# Patient Record
Sex: Male | Born: 1960 | Race: White | Hispanic: No | Marital: Single | Smoking: Current every day smoker
Health system: Southern US, Community
[De-identification: ages and names within clinical notes are randomized; demographics above are authoritative.]

## PROBLEM LIST (undated history)

## (undated) DIAGNOSIS — J449 Chronic obstructive pulmonary disease, unspecified: Secondary | ICD-10-CM

## (undated) DIAGNOSIS — I499 Cardiac arrhythmia, unspecified: Secondary | ICD-10-CM

## (undated) DIAGNOSIS — I509 Heart failure, unspecified: Secondary | ICD-10-CM

## (undated) DIAGNOSIS — J45909 Unspecified asthma, uncomplicated: Secondary | ICD-10-CM

## (undated) DIAGNOSIS — Z720 Tobacco use: Secondary | ICD-10-CM

## (undated) HISTORY — DX: Heart failure, unspecified: I50.9

## (undated) HISTORY — DX: Cardiac arrhythmia, unspecified: I49.9

---

## 2002-07-06 ENCOUNTER — Encounter: Payer: Self-pay | Admitting: Emergency Medicine

## 2002-07-06 ENCOUNTER — Inpatient Hospital Stay (HOSPITAL_COMMUNITY): Admission: EM | Admit: 2002-07-06 | Discharge: 2002-07-06 | Payer: Self-pay | Admitting: Emergency Medicine

## 2004-11-21 ENCOUNTER — Inpatient Hospital Stay (HOSPITAL_COMMUNITY): Admission: EM | Admit: 2004-11-21 | Discharge: 2004-11-22 | Payer: Self-pay | Admitting: *Deleted

## 2005-12-09 IMAGING — CR DG CHEST 1V PORT
1 series · 1 of 1 positions shown · non-contrast
Comparison: none

CLINICAL DATA: Shortness of breath.  
 PORTABLE CHEST 1 VIEW - 11/22/04 AT 4961 HOURS:
 Compare study:    11/21/04 at 8839 hours.
 Vascular congestion.  Negative for edema or infiltrates.  Left basilar subsegmental atelectasis.  Mild cardiac enlargement.

[view not recorded]
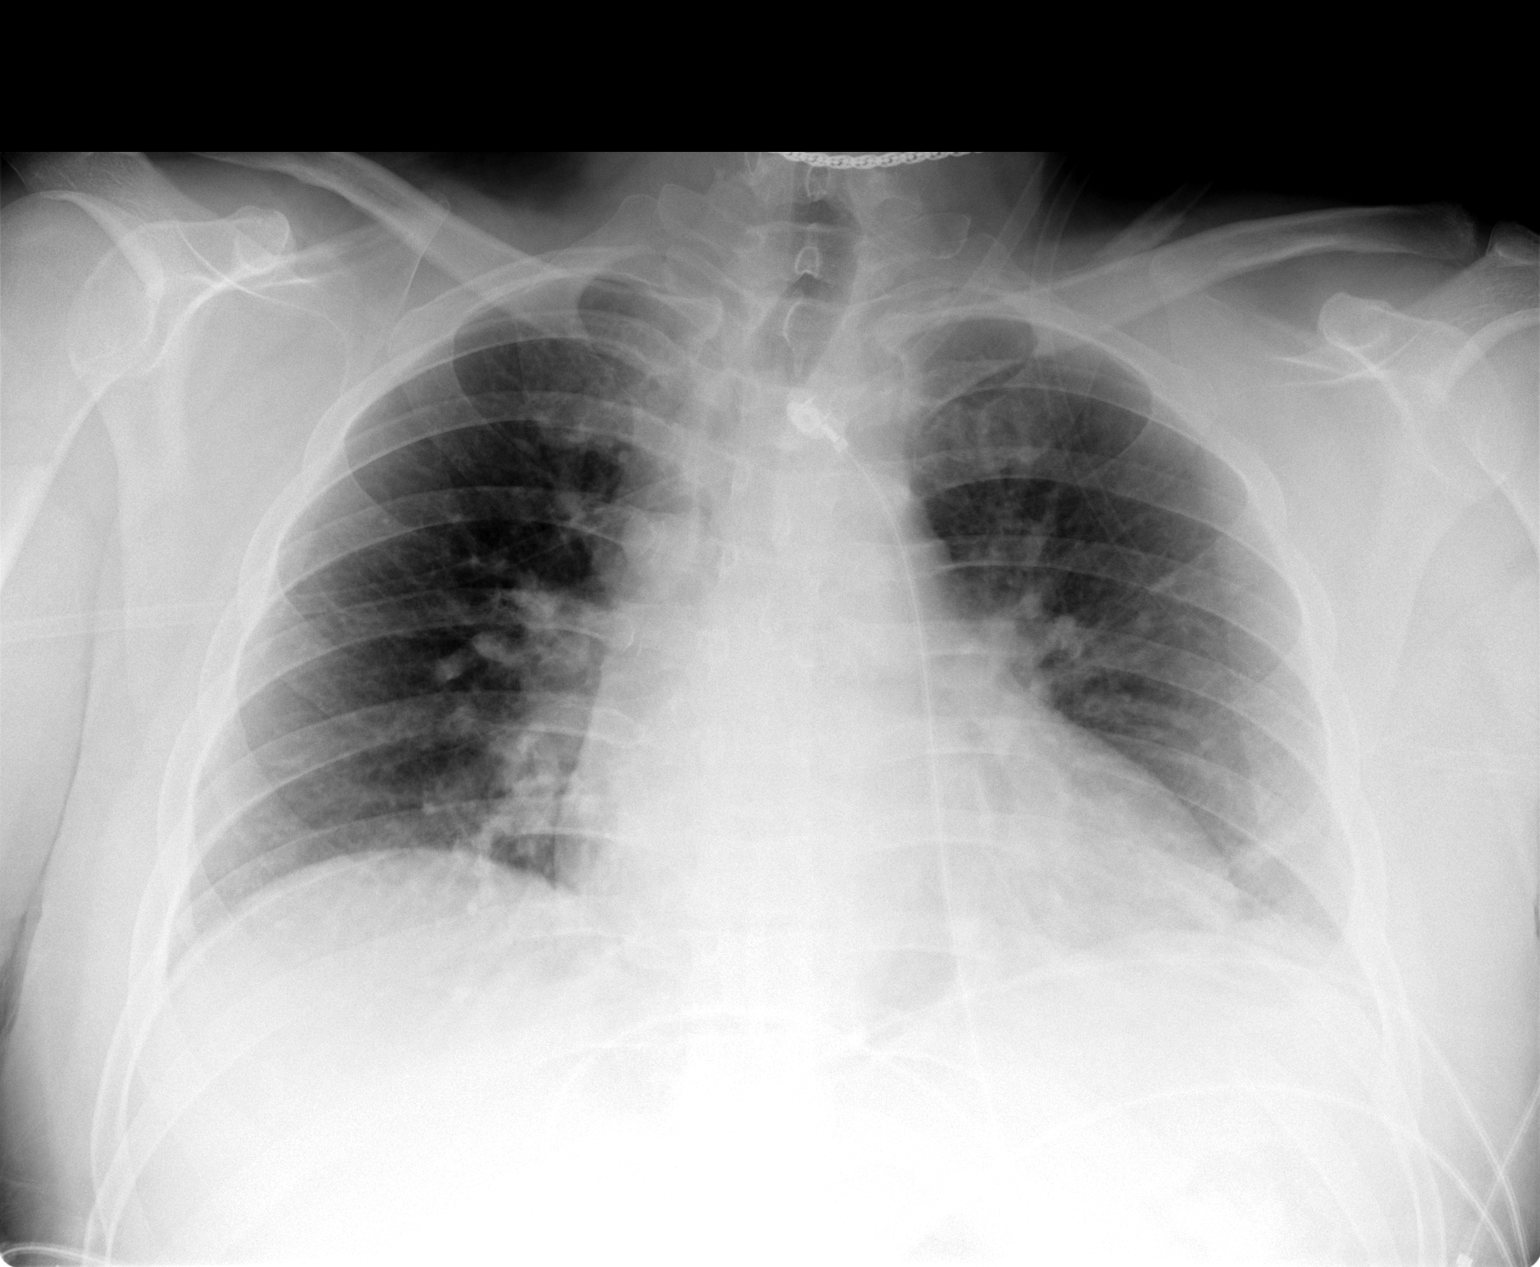

[1 of 1 positions shown; findings below may reference images not displayed]

IMPRESSION: Left basilar subsegmental atelectasis with vascular congestion.

## 2009-04-20 ENCOUNTER — Emergency Department: Payer: Self-pay | Admitting: Emergency Medicine

## 2013-01-26 ENCOUNTER — Emergency Department (HOSPITAL_COMMUNITY)
Admission: EM | Admit: 2013-01-26 | Discharge: 2013-01-26 | Disposition: A | Discharge status: Home / Self Care | Payer: Self-pay | Attending: Emergency Medicine | Admitting: Emergency Medicine

## 2013-01-26 ENCOUNTER — Encounter (HOSPITAL_COMMUNITY): Payer: Self-pay

## 2013-01-26 ENCOUNTER — Emergency Department (HOSPITAL_COMMUNITY): Payer: Self-pay

## 2013-01-26 DIAGNOSIS — J449 Chronic obstructive pulmonary disease, unspecified: Secondary | ICD-10-CM | POA: Insufficient documentation

## 2013-01-26 DIAGNOSIS — R0789 Other chest pain: Secondary | ICD-10-CM | POA: Insufficient documentation

## 2013-01-26 DIAGNOSIS — R059 Cough, unspecified: Secondary | ICD-10-CM | POA: Insufficient documentation

## 2013-01-26 DIAGNOSIS — Z79899 Other long term (current) drug therapy: Secondary | ICD-10-CM | POA: Insufficient documentation

## 2013-01-26 DIAGNOSIS — J45902 Unspecified asthma with status asthmaticus: Secondary | ICD-10-CM | POA: Insufficient documentation

## 2013-01-26 DIAGNOSIS — R05 Cough: Secondary | ICD-10-CM | POA: Insufficient documentation

## 2013-01-26 DIAGNOSIS — F17200 Nicotine dependence, unspecified, uncomplicated: Secondary | ICD-10-CM | POA: Insufficient documentation

## 2013-01-26 HISTORY — DX: Unspecified asthma, uncomplicated: J45.909

## 2013-01-26 LAB — BASIC METABOLIC PANEL
BUN: 13 mg/dL (ref 6–23)
Calcium: 8.9 mg/dL (ref 8.4–10.5)
Chloride: 102 mEq/L (ref 96–112)
Creatinine, Ser: 0.84 mg/dL (ref 0.50–1.35)
GFR calc Af Amer: >90 mL/min (ref >90)
GFR calc non Af Amer: >90 mL/min (ref >90)
Glucose, Bld: 86 mg/dL (ref 70–99)
Potassium: 3.7 mEq/L (ref 3.5–5.1)
Sodium: 142 mEq/L (ref 135–145)

## 2013-01-26 LAB — CBC WITH DIFFERENTIAL/PLATELET
Basophils Absolute: 0 10*3/uL (ref 0.0–0.1)
Basophils Relative: 0 % (ref 0–1)
Eosinophils Absolute: 0.7 10*3/uL (ref 0.0–0.7)
Eosinophils Relative: 10 % — ABNORMAL HIGH (ref 0–5)
HCT: 49.3 % (ref 39.0–52.0)
Hemoglobin: 16.9 g/dL (ref 13.0–17.0)
Lymphocytes Relative: 37 % (ref 12–46)
MCH: 30.6 pg (ref 26.0–34.0)
MCHC: 34.3 g/dL (ref 30.0–36.0)
Monocytes Relative: 7 % (ref 3–12)
Neutro Abs: 3.2 10*3/uL (ref 1.7–7.7)
Neutrophils Relative %: 46 % (ref 43–77)
Platelets: 228 10*3/uL (ref 150–400)
RBC: 5.53 MIL/uL (ref 4.22–5.81)
RDW: 13.9 % (ref 11.5–15.5)
WBC: 6.9 10*3/uL (ref 4.0–10.5)

## 2013-01-26 MED ORDER — HYDROCODONE-HOMATROPINE 5-1.5 MG/5ML PO SYRP: 5.0000 mL | ORAL_SOLUTION | Freq: Four times a day (QID) | ORAL | Status: DC | PRN

## 2013-01-26 MED ORDER — PREDNISONE 20 MG PO TABS: 60.0000 mg | ORAL_TABLET | Freq: Every day | ORAL | Status: DC

## 2013-01-26 MED ORDER — METHYLPREDNISOLONE SODIUM SUCC 125 MG IJ SOLR
125.0000 mg | Freq: Once | INTRAMUSCULAR | Status: AC
  Administered 2013-01-26: 125 mg via INTRAVENOUS
  Filled 2013-01-26: qty 2

## 2013-01-26 MED ORDER — ALBUTEROL SULFATE (5 MG/ML) 0.5% IN NEBU
10.0000 mg | INHALATION_SOLUTION | RESPIRATORY_TRACT | Status: AC
  Administered 2013-01-26: 10 mg via RESPIRATORY_TRACT
  Filled 2013-01-26: qty 2

## 2013-01-26 MED ORDER — ALBUTEROL SULFATE (2.5 MG/3ML) 0.083% IN NEBU: 2.5000 mg | INHALATION_SOLUTION | RESPIRATORY_TRACT | Status: DC | PRN

## 2013-01-26 MED ORDER — ALBUTEROL SULFATE HFA 108 (90 BASE) MCG/ACT IN AERS: 2.0000 | INHALATION_SPRAY | RESPIRATORY_TRACT | Status: DC | PRN

## 2013-01-26 MED ORDER — AZITHROMYCIN 250 MG PO TABS: ORAL_TABLET | ORAL | Status: DC

## 2013-01-26 NOTE — ED Notes (Signed)
Pt is currently on continuous neb. NAD.

## 2013-01-26 NOTE — ED Notes (Signed)
Complain of being SOB and wheezing for a week

## 2013-01-26 NOTE — ED Provider Notes (Signed)
History     CSN: [id number]  Arrival date & time 01/26/13  1732   First MD Initiated Contact with Patient 01/26/13 1740      Chief Complaint  Patient presents with  . Asthma    (Consider location/radiation/quality/duration/timing/severity/associated sxs/prior treatment) HPI Comments: Patient comes to the ER for evaluation of shortness of breath and wheezing. Patient reports a previous history of asthma, does have a long smoking history as well. Patient reports that last week he has had progressively worsening shortness of breath associated with cough. He has not had any fever. Cough is mostly nonproductive. Patient reports that shortness of breath has become severe, now experiencing pain with breathing.  Patient is a 52 y.o. male presenting with asthma.  Asthma Associated symptoms include shortness of breath.    Past Medical History  Diagnosis Date  . Asthma     History reviewed. No pertinent past surgical history.  No family history on file.  History  Substance Use Topics  . Smoking status: Current Every Day Smoker  . Smokeless tobacco: Not on file  . Alcohol Use: No      Review of Systems  Constitutional: Negative for fever.  Respiratory: Positive for cough, chest tightness and shortness of breath.   All other systems reviewed and are negative.    Allergies  Review of patient's allergies indicates no known allergies.  Home Medications   Current Outpatient Rx  Name  Route  Sig  Dispense  Refill  . albuterol (PROVENTIL HFA;VENTOLIN HFA) 108 (90 BASE) MCG/ACT inhaler   Inhalation   Inhale 2 puffs into the lungs every 6 (six) hours as needed for wheezing.           BP 147/93  Pulse 90  Temp(Src) 97.7 F (36.5 C) (Oral)  Resp 26  Ht 6' (1.829 m)  Wt 260 lb (117.935 kg)  BMI 35.25 kg/m2  SpO2 90%  Physical Exam  Constitutional: He is oriented to person, place, and time. He appears well-developed and well-nourished. He appears distressed.  HENT:   Head: Normocephalic and atraumatic.  Right Ear: Hearing normal.  Nose: Nose normal.  Mouth/Throat: Oropharynx is clear and moist and mucous membranes are normal.  Eyes: Conjunctivae and EOM are normal. Pupils are equal, round, and reactive to light.  Neck: Normal range of motion. Neck supple.  Cardiovascular: Normal rate, regular rhythm, S1 normal and S2 normal.  Exam reveals no gallop and no friction rub.   No murmur heard. Pulmonary/Chest: Accessory muscle usage present. Tachypnea noted. He has wheezes in the right upper field, the right middle field, the right lower field, the left upper field, the left middle field and the left lower field. He has no rhonchi. He has no rales. He exhibits no tenderness.  Abdominal: Soft. Normal appearance and bowel sounds are normal. There is no hepatosplenomegaly. There is no tenderness. There is no rebound, no guarding, no tenderness at McBurney's point and negative Murphy's sign. No hernia.  Musculoskeletal: Normal range of motion.  Neurological: He is alert and oriented to person, place, and time. He has normal strength. No cranial nerve deficit or sensory deficit. Coordination normal. GCS eye subscore is 4. GCS verbal subscore is 5. GCS motor subscore is 6.  Skin: Skin is warm, dry and intact. No rash noted. No cyanosis.  Psychiatric: He has a normal mood and affect. His speech is normal and behavior is normal. Thought content normal.    ED Course  Procedures (including critical care time)  Labs Reviewed  CBC WITH DIFFERENTIAL - Abnormal; Notable for the following:    Eosinophils Relative 10 (*)    All other components within normal limits  BASIC METABOLIC PANEL   Dg Chest Port 1 View  01/26/2013  *RADIOLOGY REPORT*  Clinical Data: Shortness of breath.  PORTABLE CHEST - 1 VIEW  Comparison: 11/22/2004  Findings: Scarring in the lingula.  Heart is normal size.  Right lung is clear.  No effusions or acute bony abnormality.  IMPRESSION: No acute  cardiopulmonary disease.   Original Report Authenticated By: Charlett Nose, M.D.      Diagnosis: COPD exacerbation    MDM  Patient presents to the ER with complaints of sickness for shortness of breath. He reports a history of asthma, but has a long smoking history and likely has an element of COPD as well. Patient had significant bronchospasm at arrival. Oxygen saturation was around 90% on room air. Patient was administered a one hour albuterol treatment and Solu-Medrol. Repeat examination revealed that he was breathing more comfortably, but still hypoxic. Oxygen running 88% to 91% on room air at rest. I recommended admission. Patient declines admission at this time. I did discuss with him the seriousness of his illness. I explained to him that his breathing to the person that he stopped breathing. This could result in permanent disability, brain damage and death. He understands this but does not wish to be admitted to the hospital. He lives alone. Arrangements have been made for him to stay with his sister and mother for the next few days. His mother has COPD and has a nebulizer machine. Patient will be provided albuterol solution for nebulizer, treated with Zithromax and prednisone as well. Patient given Hycodan for cough. He was counseled that he needs to return to the ER for any worsening symptoms and states that he will. Once again, patient declined admission. His sister is present and she agrees with this decision. He'll stay with her and she is taking responsibility for him.        Gilda Crease, MD 01/26/13 [phone]

## 2013-07-23 ENCOUNTER — Emergency Department: Payer: Self-pay | Admitting: Emergency Medicine

## 2014-09-07 ENCOUNTER — Emergency Department: Payer: Self-pay | Admitting: Emergency Medicine

## 2015-03-19 ENCOUNTER — Emergency Department
Admit: 2015-03-19 | Disposition: A | Discharge status: Home / Self Care | Payer: Self-pay | Admitting: Emergency Medicine

## 2015-03-19 LAB — CBC
HCT: 46.7 % (ref 40.0–52.0)
HGB: 15.3 g/dL (ref 13.0–18.0)
MCH: 29.9 pg (ref 26.0–34.0)
MCHC: 32.7 g/dL (ref 32.0–36.0)
MCV: 92 fL (ref 80–100)
Platelet: 228 10*3/uL (ref 150–440)
RBC: 5.1 10*6/uL (ref 4.40–5.90)
RDW: 15 % — ABNORMAL HIGH (ref 11.5–14.5)
WBC: 8.7 10*3/uL (ref 3.8–10.6)

## 2015-03-19 LAB — URIC ACID: Uric Acid: 5.4 mg/dL

## 2015-09-24 IMAGING — CR DG HIP COMPLETE 2+V*L*
1 series · 4 of 4 positions shown · non-contrast
Comparison: None.

CLINICAL DATA: 52-year-old male with acute left hip pain. Reports
numerous recent falls (not otherwise specified at the time of this
report). initial encounter.

EXAM:
LEFT HIP - COMPLETE 2+ VIEW

[Series 1: dxr hip left complete · 0.14mm/px · 4 of 4 slices shown]
[im 1/4]
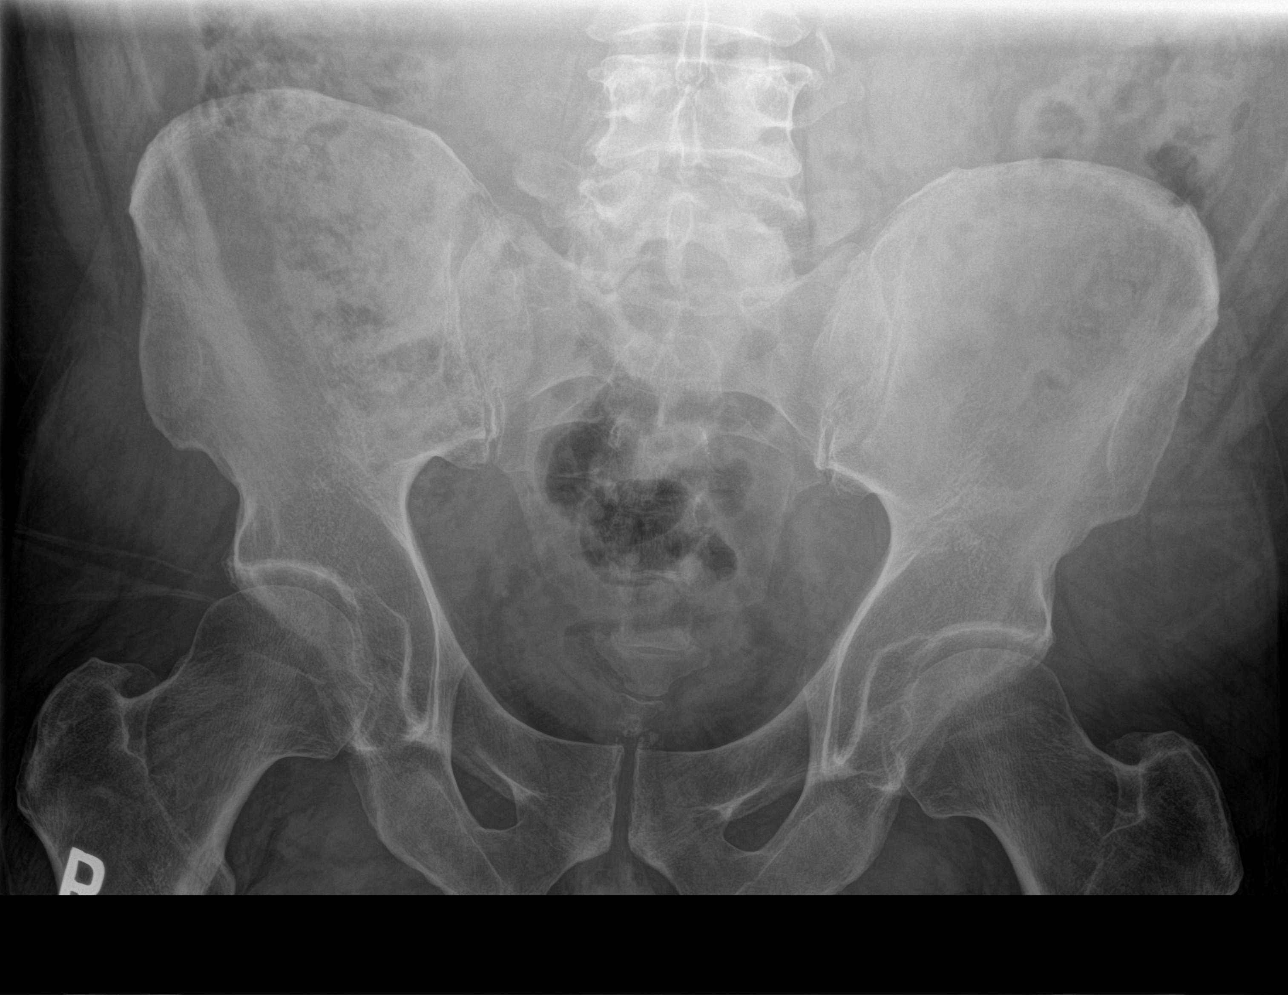
[im 2/4]
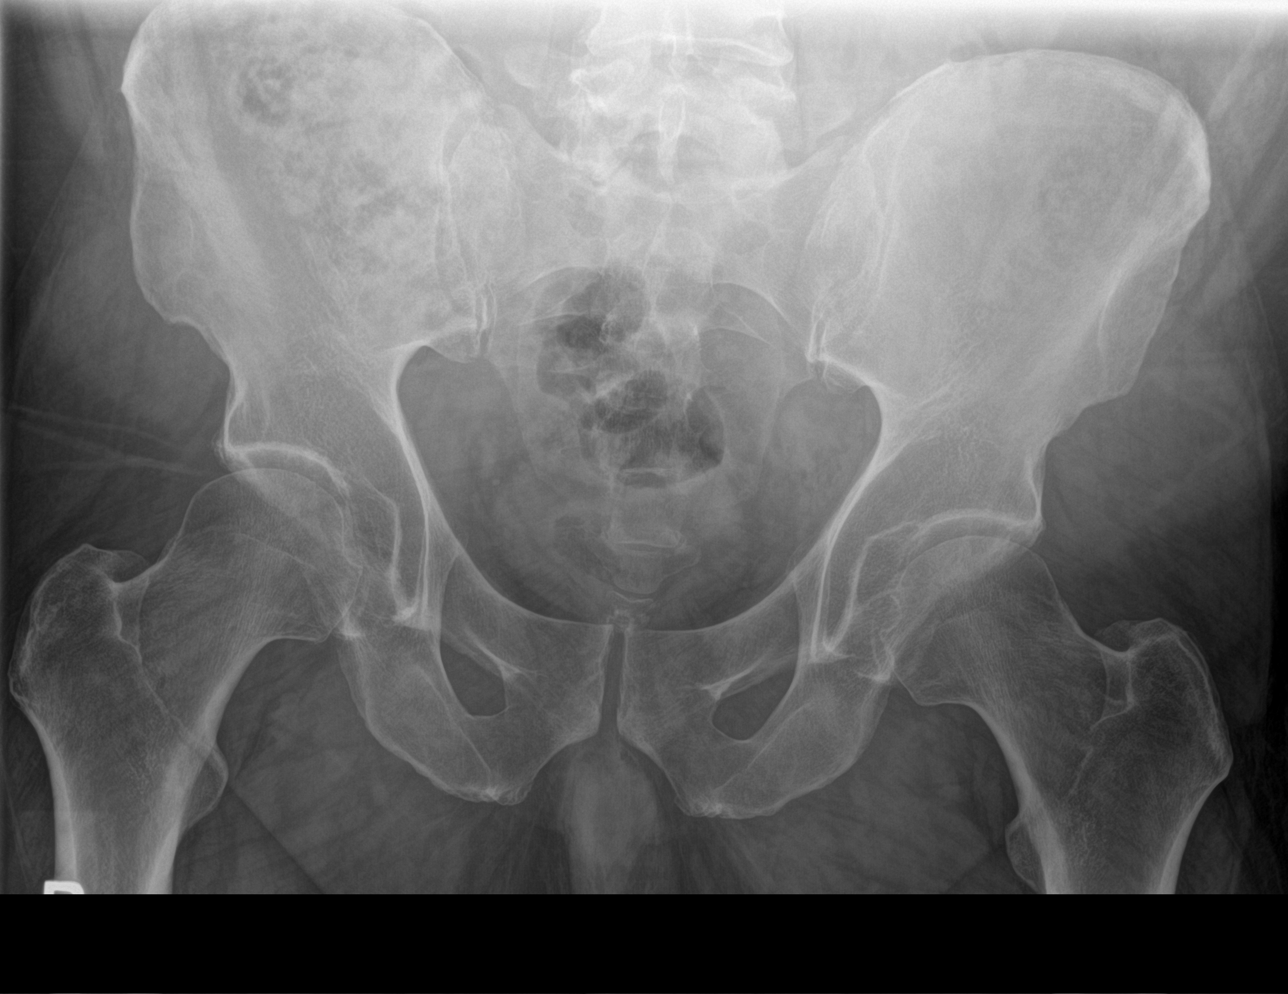
[im 3/4]
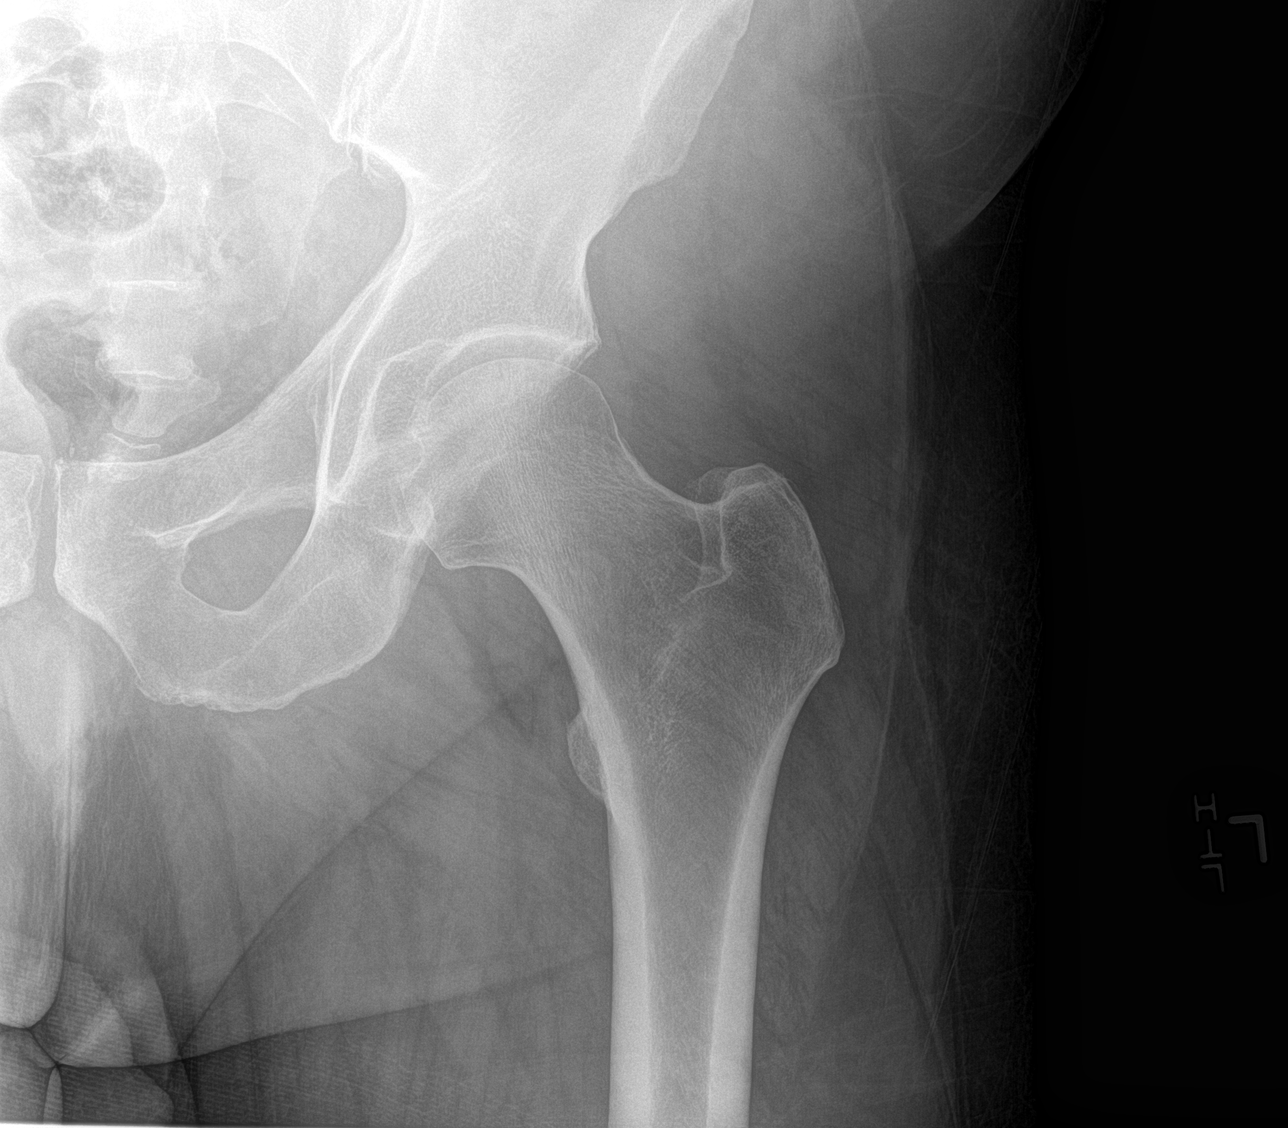
[im 4/4]
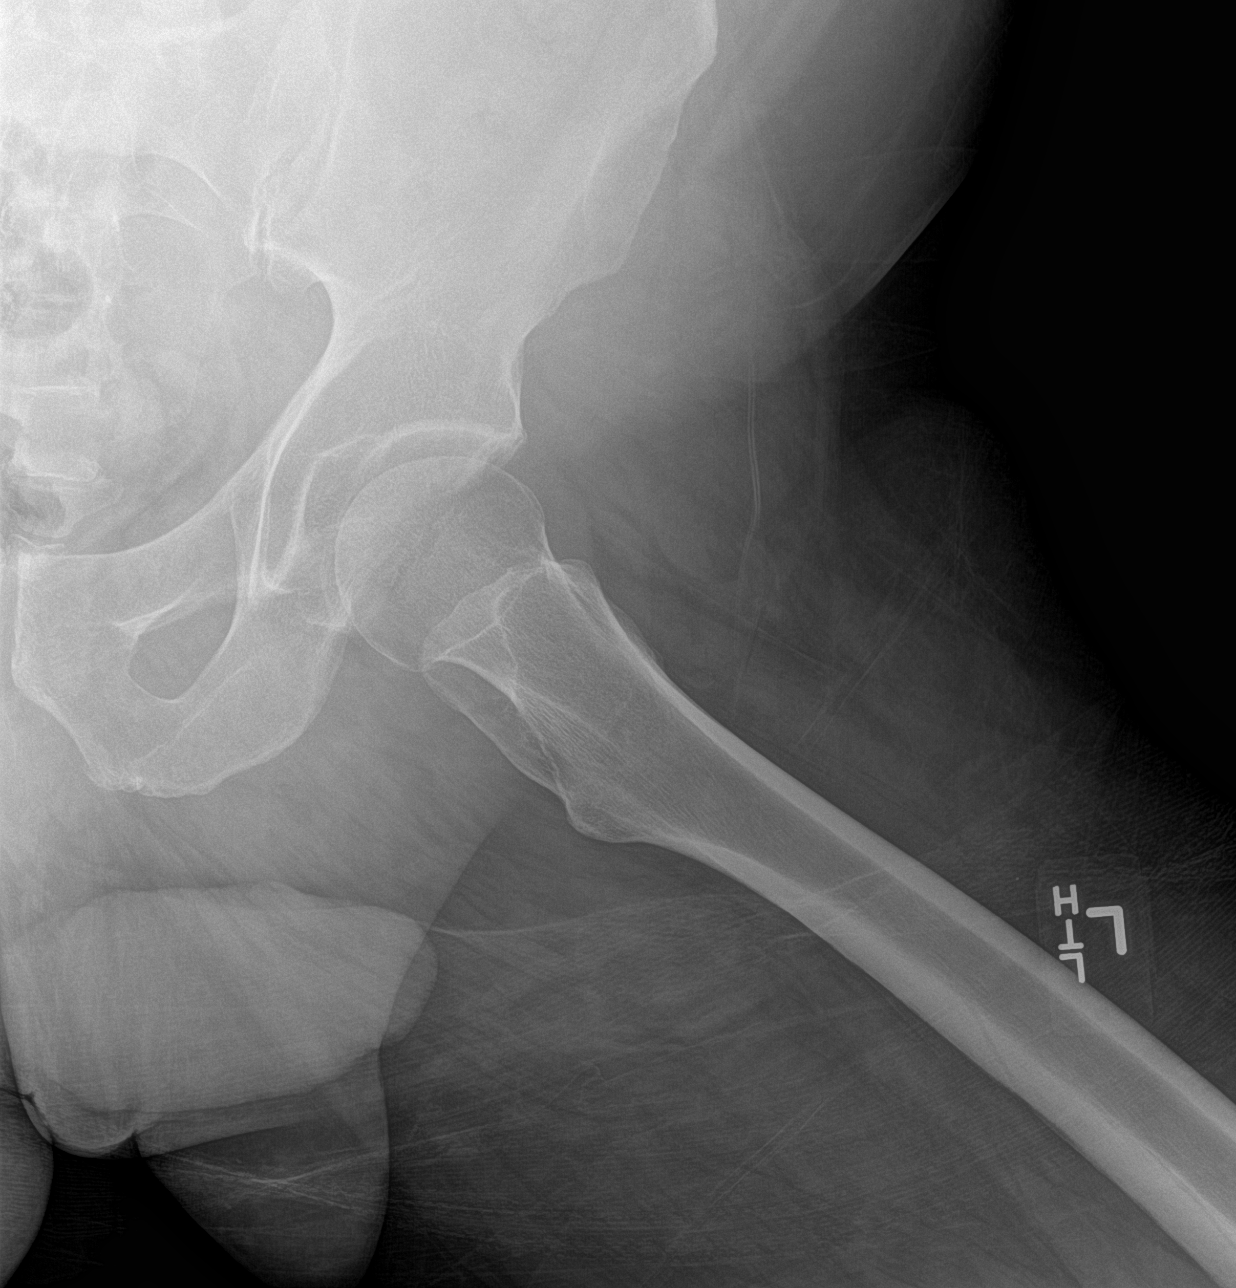

[4 of 4 positions shown; findings below may reference images not displayed]

FINDINGS: Both femoral heads are normally located. Hip joint spaces are
preserved. Pelvis intact. sacral ala and SI joints appear normal.
Proximal right femur grossly intact. Proximal left femur intact.
IMPRESSION: Negative for age radiographic appearance of the left hip and pelvis.

## 2015-10-01 ENCOUNTER — Encounter: Payer: Self-pay | Admitting: Emergency Medicine

## 2015-10-01 ENCOUNTER — Emergency Department: Payer: Self-pay

## 2015-10-01 ENCOUNTER — Emergency Department
Admission: EM | Admit: 2015-10-01 | Discharge: 2015-10-01 | Disposition: A | Discharge status: Home / Self Care | Payer: Self-pay | Attending: Emergency Medicine | Admitting: Emergency Medicine

## 2015-10-01 DIAGNOSIS — S40811A Abrasion of right upper arm, initial encounter: Secondary | ICD-10-CM | POA: Insufficient documentation

## 2015-10-01 DIAGNOSIS — Y998 Other external cause status: Secondary | ICD-10-CM | POA: Insufficient documentation

## 2015-10-01 DIAGNOSIS — Y9289 Other specified places as the place of occurrence of the external cause: Secondary | ICD-10-CM | POA: Insufficient documentation

## 2015-10-01 DIAGNOSIS — S20211A Contusion of right front wall of thorax, initial encounter: Secondary | ICD-10-CM | POA: Insufficient documentation

## 2015-10-01 DIAGNOSIS — Y9389 Activity, other specified: Secondary | ICD-10-CM | POA: Insufficient documentation

## 2015-10-01 DIAGNOSIS — Z720 Tobacco use: Secondary | ICD-10-CM | POA: Insufficient documentation

## 2015-10-01 DIAGNOSIS — S32000A Wedge compression fracture of unspecified lumbar vertebra, initial encounter for closed fracture: Secondary | ICD-10-CM

## 2015-10-01 DIAGNOSIS — W1789XA Other fall from one level to another, initial encounter: Secondary | ICD-10-CM | POA: Insufficient documentation

## 2015-10-01 DIAGNOSIS — S32059A Unspecified fracture of fifth lumbar vertebra, initial encounter for closed fracture: Secondary | ICD-10-CM | POA: Insufficient documentation

## 2015-10-01 DIAGNOSIS — J45901 Unspecified asthma with (acute) exacerbation: Secondary | ICD-10-CM | POA: Insufficient documentation

## 2015-10-01 DIAGNOSIS — S32039A Unspecified fracture of third lumbar vertebra, initial encounter for closed fracture: Secondary | ICD-10-CM | POA: Insufficient documentation

## 2015-10-01 DIAGNOSIS — S93402A Sprain of unspecified ligament of left ankle, initial encounter: Secondary | ICD-10-CM | POA: Insufficient documentation

## 2015-10-01 MED ORDER — HYDROMORPHONE HCL 1 MG/ML IJ SOLN
1.0000 mg | Freq: Once | INTRAMUSCULAR | Status: AC
  Administered 2015-10-01: 1 mg via INTRAMUSCULAR
  Filled 2015-10-01: qty 1

## 2015-10-01 MED ORDER — ONDANSETRON 4 MG PO TBDP
ORAL_TABLET | ORAL | Status: AC
Start: 2015-10-01 — End: 2015-10-01
  Administered 2015-10-01: 4 mg via ORAL
  Filled 2015-10-01: qty 1

## 2015-10-01 MED ORDER — ONDANSETRON 4 MG PO TBDP
ORAL_TABLET | ORAL | Status: AC
  Administered 2015-10-01: 4 mg via ORAL
  Filled 2015-10-01: qty 1

## 2015-10-01 MED ORDER — IBUPROFEN 600 MG PO TABS
600.0000 mg | ORAL_TABLET | Freq: Once | ORAL | Status: AC
  Administered 2015-10-01: 600 mg via ORAL
  Filled 2015-10-01: qty 1

## 2015-10-01 MED ORDER — IBUPROFEN 600 MG PO TABS: 600.0000 mg | ORAL_TABLET | Freq: Three times a day (TID) | ORAL | Status: DC | PRN

## 2015-10-01 MED ORDER — ONDANSETRON HCL 4 MG/2ML IJ SOLN: 4.0000 mg | Freq: Once | INTRAMUSCULAR | Status: DC

## 2015-10-01 MED ORDER — ONDANSETRON 4 MG PO TBDP
4.0000 mg | ORAL_TABLET | Freq: Once | ORAL | Status: AC
  Administered 2015-10-01: 4 mg via ORAL

## 2015-10-01 NOTE — ED Notes (Signed)
philly collar applied in triage

## 2015-10-01 NOTE — ED Notes (Signed)
Pt was working and fell 9 feet through ceiling.  C/o back pain, right ankle pain, right rib pain.  Pain with breathing.  "back felt weird when hit" not workers comp per pt.

## 2015-10-01 NOTE — ED Provider Notes (Signed)
[facility] Emergency Department Provider Note   ____________________________________________  Time seen: 3:00 PM I have reviewed the triage vital signs and the triage nursing note.  HISTORY  Chief Complaint Fall   Historian Patient  HPI Kenneth Benson is a 54 y.o. male who is here for evaluation after a fall. He fell through a ceiling landing on his feet. He waited about a half hour before deciding to come over for evaluation. He did not strike his head. There is no loss of consciousness. There is no neck pain. He is complaining of pain at the left lateral ankle, right side chest wall when he takes a deep breath or coughs, and the low back. He does have history of low back pain. Pain is between 4-5 on at rest and up to a 7 or 8 when he moves.    Past Medical History  Diagnosis Date  . Asthma     There are no active problems to display for this patient.   History reviewed. No pertinent past surgical history.  Current Outpatient Rx  Name  Route  Sig  Dispense  Refill  . albuterol (PROVENTIL HFA;VENTOLIN HFA) 108 (90 BASE) MCG/ACT inhaler   Inhalation   Inhale 2 puffs into the lungs every 6 (six) hours as needed for wheezing.         Marland Kitchen ibuprofen (ADVIL,MOTRIN) 600 MG tablet   Oral   Take 1 tablet (600 mg total) by mouth every 8 (eight) hours as needed.   20 tablet   0     Allergies Review of patient's allergies indicates no known allergies.  History reviewed. No pertinent family history.  Social History Social History  Substance Use Topics  . Smoking status: Current Every Day Smoker  . Smokeless tobacco: None  . Alcohol Use: No    Review of Systems  Constitutional: Negative for recent illnesses. Eyes: Negative for visual changes. ENT: Negative for sore throat. Cardiovascular: Negative for palpitations. Respiratory: Negative for shortness of breath. Gastrointestinal: Negative for abdominal pain, vomiting and  diarrhea. Genitourinary: Negative for dysuria. Musculoskeletal: Positive for back pain. Skin: Negative for rash. Neurological: Negative for headache. 10 point Review of Systems otherwise negative ____________________________________________   PHYSICAL EXAM:  VITAL SIGNS: ED Triage Vitals  Enc Vitals Group     BP 10/01/15 1412 102/68 mmHg     Pulse Rate 10/01/15 1412 96     Resp 10/01/15 1412 22     Temp 10/01/15 1412 97.8 F (36.6 C)     Temp Source 10/01/15 1412 Oral     SpO2 10/01/15 1412 94 %     Weight 10/01/15 1412 270 lb (122.471 kg)     Height 10/01/15 1412 6' (1.829 m)     Head Cir --      Peak Flow --      Pain Score 10/01/15 1412 8     Pain Loc --      Pain Edu? --      Excl. in GC? --      Constitutional: Alert and oriented. Well appearing and in no distress. Smells heavily of cigarette smoke Eyes: Conjunctivae are normal. PERRL. Normal extraocular movements. ENT   Head: Normocephalic and atraumatic.   Nose: No congestion/rhinnorhea.   Mouth/Throat: Mucous membranes are moist.   Neck: No stridor. Cardiovascular/Chest: Normal rate, regular rhythm.  No murmurs, rubs, or gallops. Chest wall tenderness on the right lateral area with lateral compression. No ecchymosis visualized. No flail chest. Respiratory: Normal respiratory  effort without tachypnea nor retractions. Breath sounds are clear and equal bilaterally. Mild wheeze. Gastrointestinal: Soft. No distention, no guarding, no rebound. Nontender   Genitourinary/rectal:Deferred Musculoskeletal: Pelvis stable, hips nontender. Left lateral ankle is swollen with tenderness to palpation along the lateral malleolus. Neurovascularly intact in 4 extremities. Abrasions and early ecchymosis at the right inner upper arm. No bony tenderness to the upper extremities or right lower extremity.  Neurologic:  Normal speech and language. No gross or focal neurologic deficits are appreciated. Skin:  Skin is warm, dry  and intact. No rash noted. Psychiatric: Mood and affect are normal. Speech and behavior are normal. Patient exhibits appropriate insight and judgment.  ____________________________________________   EKG I, Governor Rooksebecca Allani Reber, MD, the attending physician have personally viewed and interpreted all ECGs.  No EKG performed ____________________________________________  LABS (pertinent positives/negatives)  None  ____________________________________________  RADIOLOGY All Xrays were viewed by me. Imaging interpreted by Radiologist.  Left ankle complete: No acute fracture or subluxation. Lateral soft tissue swelling Chest x-ray two-view: No acute posttraumatic deformity identified.  peribronchialthickening which may relate to chronic bronchitis or smoking  Lumbar spine 2-3 view:  IMPRESSION: Six lumbar type vertebral bodies, labeled T12 through L5. Presuming this nomenclature, minimal superior endplate irregularity and vertebral body height loss at L3. Given sclerosis, favored to be least partially remote. If there is local pain in this area, consider lumbar spine MRI. __________________________________________  PROCEDURES  Procedure(s) performed: None  Critical Care performed: None  ____________________________________________   ED COURSE / ASSESSMENT AND PLAN  CONSULTATIONS: None  Pertinent labs & imaging results that were available during my care of the patient were reviewed by me and considered in my medical decision making (see chart for details).   Stable vital signs on arrival. No head or neck injury. I imaged areas of localized pain, and lumbar x-ray shows possible acute L3 vertebral body compression fracture. Patient does have area in this area, but not particularly localized over the point of the vertebral body. I discussed with him the possibility of acute versus chronic compression fracture. Left ankle swelling consistent with contusion/sprain as x-ray is negative  for fracture. We discussed conservative management including ice, elevation, and ibuprofen.  Patient / Family / Caregiver informed of clinical course, medical decision-making process, and agree with plan.   I discussed return precautions, follow-up instructions, and discharged instructions with patient and/or family.  ___________________________________________   FINAL CLINICAL IMPRESSION(S) / ED DIAGNOSES   Final diagnoses:  Abrasion of right arm, initial encounter  Left ankle sprain, initial encounter  Lumbar compression fracture, closed, initial encounter (HCC)  Contusion of chest wall, right, initial encounter       Governor Rooksebecca Evonte Prestage, MD 10/01/15 1702

## 2015-10-01 NOTE — Discharge Instructions (Signed)
You were evaluated after a fall for left ankle pain, right chest pain, and low back pain. X-ray of the ankle was negative for fracture, and I suspect bruise and or strain. Ice pack to swollen area 15 minutes out of the hour for the next 24 hours,and elevated as much as possible for the next 2 or 3 days.  Your back x-ray showed evidence of a compression fracture of the lumbar vertebra #3. Return to the emergency department for any new or worsening condition including worsening pain, any weakness or numbness, any bowel or bladder incontinence, any chest pain or trouble breathing, or any other symptoms concerning to you.   Ankle Sprain An ankle sprain is an injury to the strong, fibrous tissues (ligaments) that hold the bones of your ankle joint together.  CAUSES An ankle sprain is usually caused by a fall or by twisting your ankle. Ankle sprains most commonly occur when you step on the outer edge of your foot, and your ankle turns inward. People who participate in sports are more prone to these types of injuries.  SYMPTOMS   Pain in your ankle. The pain may be present at rest or only when you are trying to stand or walk.  Swelling.  Bruising. Bruising may develop immediately or within 1 to 2 days after your injury.  Difficulty standing or walking, particularly when turning corners or changing directions. DIAGNOSIS  Your caregiver will ask you details about your injury and perform a physical exam of your ankle to determine if you have an ankle sprain. During the physical exam, your caregiver will press on and apply pressure to specific areas of your foot and ankle. Your caregiver will try to move your ankle in certain ways. An X-ray exam may be done to be sure a bone was not broken or a ligament did not separate from one of the bones in your ankle (avulsion fracture).  TREATMENT  Certain types of braces can help stabilize your ankle. Your caregiver can make a recommendation for this. Your caregiver  may recommend the use of medicine for pain. If your sprain is severe, your caregiver may refer you to a surgeon who helps to restore function to parts of your skeletal system (orthopedist) or a physical therapist. HOME CARE INSTRUCTIONS   Apply ice to your injury for 1-2 days or as directed by your caregiver. Applying ice helps to reduce inflammation and pain.  Put ice in a plastic bag.  Place a towel between your skin and the bag.  Leave the ice on for 15-20 minutes at a time, every 2 hours while you are awake.  Only take over-the-counter or prescription medicines for pain, discomfort, or fever as directed by your caregiver.  Elevate your injured ankle above the level of your heart as much as possible for 2-3 days.  If your caregiver recommends crutches, use them as instructed. Gradually put weight on the affected ankle. Continue to use crutches or a cane until you can walk without feeling pain in your ankle.  If you have a plaster splint, wear the splint as directed by your caregiver. Do not rest it on anything harder than a pillow for the first 24 hours. Do not put weight on it. Do not get it wet. You may take it off to take a shower or bath.  You may have been given an elastic bandage to wear around your ankle to provide support. If the elastic bandage is too tight (you have numbness or tingling in  your foot or your foot becomes cold and blue), adjust the bandage to make it comfortable.  If you have an air splint, you may blow more air into it or let air out to make it more comfortable. You may take your splint off at night and before taking a shower or bath. Wiggle your toes in the splint several times per day to decrease swelling. SEEK MEDICAL CARE IF:   You have rapidly increasing bruising or swelling.  Your toes feel extremely cold or you lose feeling in your foot.  Your pain is not relieved with medicine. SEEK IMMEDIATE MEDICAL CARE IF:  Your toes are numb or blue.  You have  severe pain that is increasing. MAKE SURE YOU:   Understand these instructions.  Will watch your condition.  Will get help right away if you are not doing well or get worse.   This information is not intended to replace advice given to you by your health care provider. Make sure you discuss any questions you have with your health care provider.   Document Released: 11/14/2005 Document Revised: 12/05/2014 Document Reviewed: 11/26/2011 Elsevier Interactive Patient Education 2016 [employer]  Abrasion An abrasion is a cut or scrape on the surface of your skin. An abrasion does not go through all of the layers of your skin. It is important to take good care of your abrasion to prevent infection. HOME CARE Medicines  Take or apply medicines only as told by your doctor.  If you were prescribed an antibiotic ointment, finish all of it even if you start to feel better. Wound Care  Clean the wound with mild soap and water 2-3 times per day or as told by your doctor. Pat your wound dry with a clean towel. Do not rub it.  There are many ways to close and cover a wound. Follow instructions from your doctor about:  How to take care of your wound.  When and how you should change your bandage (dressing).  When and how you should take off your dressing.  Check your wound every day for signs of infection. Watch for:  Redness, swelling, or pain.  Fluid, blood, or pus. General Instructions  Keep the dressing dry as told by your doctor. Do not take baths, swim, use a hot tub, or do anything that would put your wound underwater until your doctor says it is okay.  If there is swelling, raise (elevate) the injured area above the level of your heart while you are sitting or lying down.  Keep all follow-up visits as told by your doctor. This is important. GET HELP IF:  You were given a tetanus shot and you have any of these where the needle went in:  Swelling.  Very bad  pain.  Redness.  Bleeding.  Medicine does not help your pain.  You have any of these at the site of the wound:  More redness.  More swelling.  More pain. GET HELP RIGHT AWAY IF:  You have a red streak going away from your wound.  You have a fever.  You have fluid, blood, or pus coming from your wound.  There is a bad smell coming from your wound.   This information is not intended to replace advice given to you by your health care provider. Make sure you discuss any questions you have with your health care provider.   Document Released: 05/02/2008 Document Revised: 03/31/2015 Document Reviewed: 11/12/2014 Elsevier Interactive Patient Education 2016 [employer].  Chest Contusion  A chest contusion is a deep bruise on your chest area. Contusions are the result of an injury that caused bleeding under the skin. A chest contusion may involve bruising of the skin, muscles, or ribs. The contusion may turn blue, purple, or yellow. Minor injuries will give you a painless contusion, but more severe contusions may stay painful and swollen for a few weeks. CAUSES  A contusion is usually caused by a blow, trauma, or direct force to an area of the body. SYMPTOMS   Swelling and redness of the injured area.  Discoloration of the injured area.  Tenderness and soreness of the injured area.  Pain. DIAGNOSIS  The diagnosis can be made by taking a history and performing a physical exam. An X-ray, CT scan, or MRI may be needed to determine if there were any associated injuries, such as broken bones (fractures) or internal injuries. TREATMENT  Often, the best treatment for a chest contusion is resting, icing, and applying cold compresses to the injured area. Deep breathing exercises may be recommended to reduce the risk of pneumonia. Over-the-counter medicines may also be recommended for pain control. HOME CARE INSTRUCTIONS   Put ice on the injured area.  Put ice in a plastic  bag.  Place a towel between your skin and the bag.  Leave the ice on for 15-20 minutes, 03-04 times a day.  Only take over-the-counter or prescription medicines as directed by your caregiver. Your caregiver may recommend avoiding anti-inflammatory medicines (aspirin, ibuprofen, and naproxen) for 48 hours because these medicines may increase bruising.  Rest the injured area.  Perform deep-breathing exercises as directed by your caregiver.  Stop smoking if you smoke.  Do not lift objects over 5 pounds (2.3 kg) for 3 days or longer if recommended by your caregiver. SEEK IMMEDIATE MEDICAL CARE IF:   You have increased bruising or swelling.  You have pain that is getting worse.  You have difficulty breathing.  You have dizziness, weakness, or fainting.  You have blood in your urine or stool.  You cough up or vomit blood.  Your swelling or pain is not relieved with medicines. MAKE SURE YOU:   Understand these instructions.  Will watch your condition.  Will get help right away if you are not doing well or get worse.   This information is not intended to replace advice given to you by your health care provider. Make sure you discuss any questions you have with your health care provider.   Document Released: 08/09/2001 Document Revised: 08/08/2012 Document Reviewed: 05/07/2012 Elsevier Interactive Patient Education 2016 Elsevier Inc.  Lumbar Fracture A lumbar fracture is a break in one of the bones of the lower back. Lumbar fractures range in severity. Severe fractures can damage the spinal cord. CAUSES This condition may be caused by:  A fall (common).  A car accident (common).  A gunshot wound.  A hard, direct hit to the back.  Osteoporosis. SYMPTOMS The main symptom of this condition is severe pain in the lower back. If a fracture is complex or severe, there may also be:  A misshapen or swollen area on the lower back.  A limited ability to move an area of the  lower back.  An inability to empty the bladder or bowel.  A loss of strength or sensation in the legs, feet, and toes.  Paralysis. DIAGNOSIS This condition is diagnosed based on:  A physical exam.  Symptoms and what happened just before they developed.  The results of imaging  tests, such as an X-ray, CT scan, or MRI. If your nerves have been damaged, you may also have other tests to find out how much damage there is. TREATMENT Treatment for this condition depends on the specifics of the injury. Most fractures can be treated with:  A back brace.  Bed rest and activity restrictions.  Pain medicine.  Physical therapy. Fractures that are complex, involve multiple bones, or make the spine unstable may require surgery to remove pressure from the nerves or spinal cord and to stabilize the broken pieces of bone. During recovery, it is normal to have pain and stiffness in the back for weeks. HOME CARE INSTRUCTIONS Medicines  Take medicines only as directed by your health care provider.  Do not drive or operate heavy machinery while taking pain medicine. Activity  Stay in bed for as long as directed by your health care provider.  If you were shown how to do any exercises to improve motion and strength in your back, do them as directed by your health care provider.  Return to your normal activities as directed by your health care provider. Ask your health care provider what activities are safe for you. General Instructions  If you were given a neck brace or back brace, wear it as directed by your health care provider.  Keep all follow-up visits as directed by your health care provider. This is important. Failure to follow-up as recommended could result in permanent injury, disability, and long-lasting (chronic) pain. SEEK MEDICAL CARE IF:  Your pain does not improve over time.  You have a persistent cough.  You cannot return to your normal activities as planned or  expected. SEEK IMMEDIATE MEDICAL CARE IF:  You have severe pain or your pain suddenly gets worse.  You are unable to move.  You have numbness, tingling, weakness, or paralysis in any part of your body.  You cannot control your bladder or bowel.  You have difficulty breathing.  You have a fever.  You have pain in your chest or abdomen.  You vomit.   This information is not intended to replace advice given to you by your health care provider. Make sure you discuss any questions you have with your health care provider.   Document Released: 03/01/2007 Document Revised: 03/31/2015 Document Reviewed: 11/10/2014 Elsevier Interactive Patient Education [employer]2016 Elsevier Inc.

## 2016-10-17 IMAGING — CR DG LUMBAR SPINE 2-3V
1 series · 3 of 3 positions shown · non-contrast
Comparison: None.

CLINICAL DATA: Initial encounter for Fell through 9ft ceiling today
while working on house. No previous injury or surgery. Pain in lower
back, left ankle swollen and painful, right sided chest wall pain.
Shielded. Smoker.

EXAM:
LUMBAR SPINE - 2-3 VIEW

[Series 1: dg lumbar spine 2-3 views · 0.14mm/px · 3 of 3 slices shown]
[im 1/3]
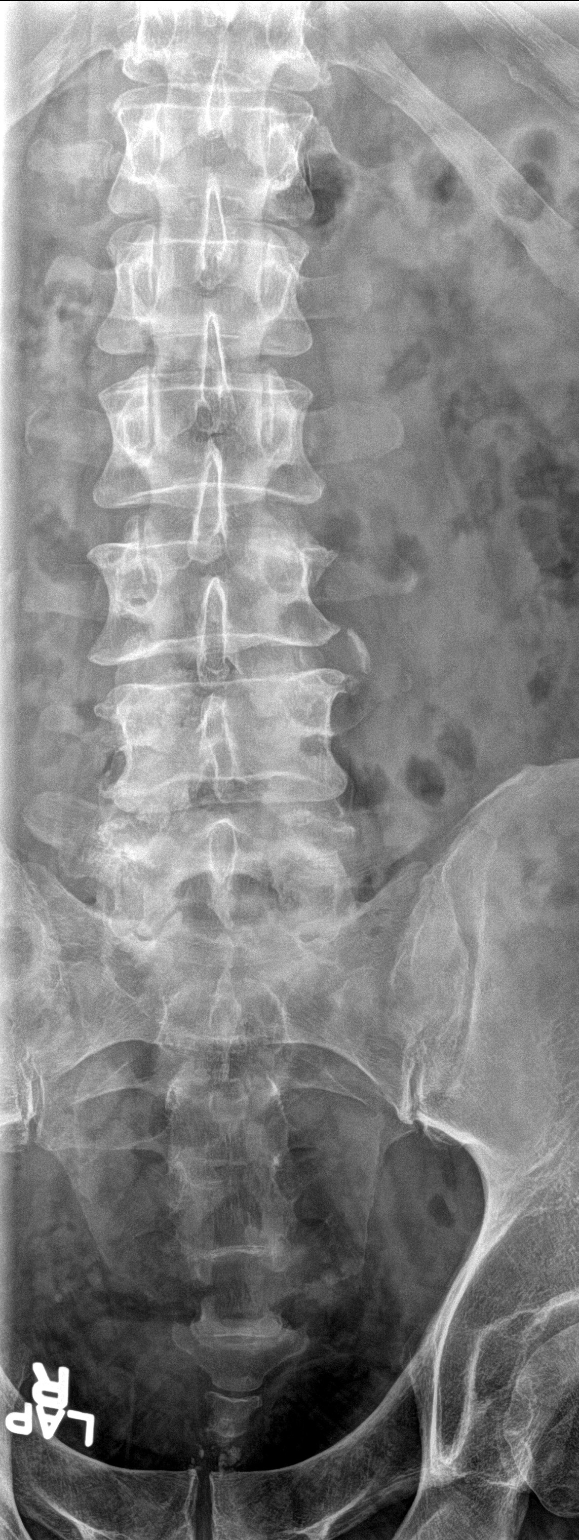
[im 2/3]
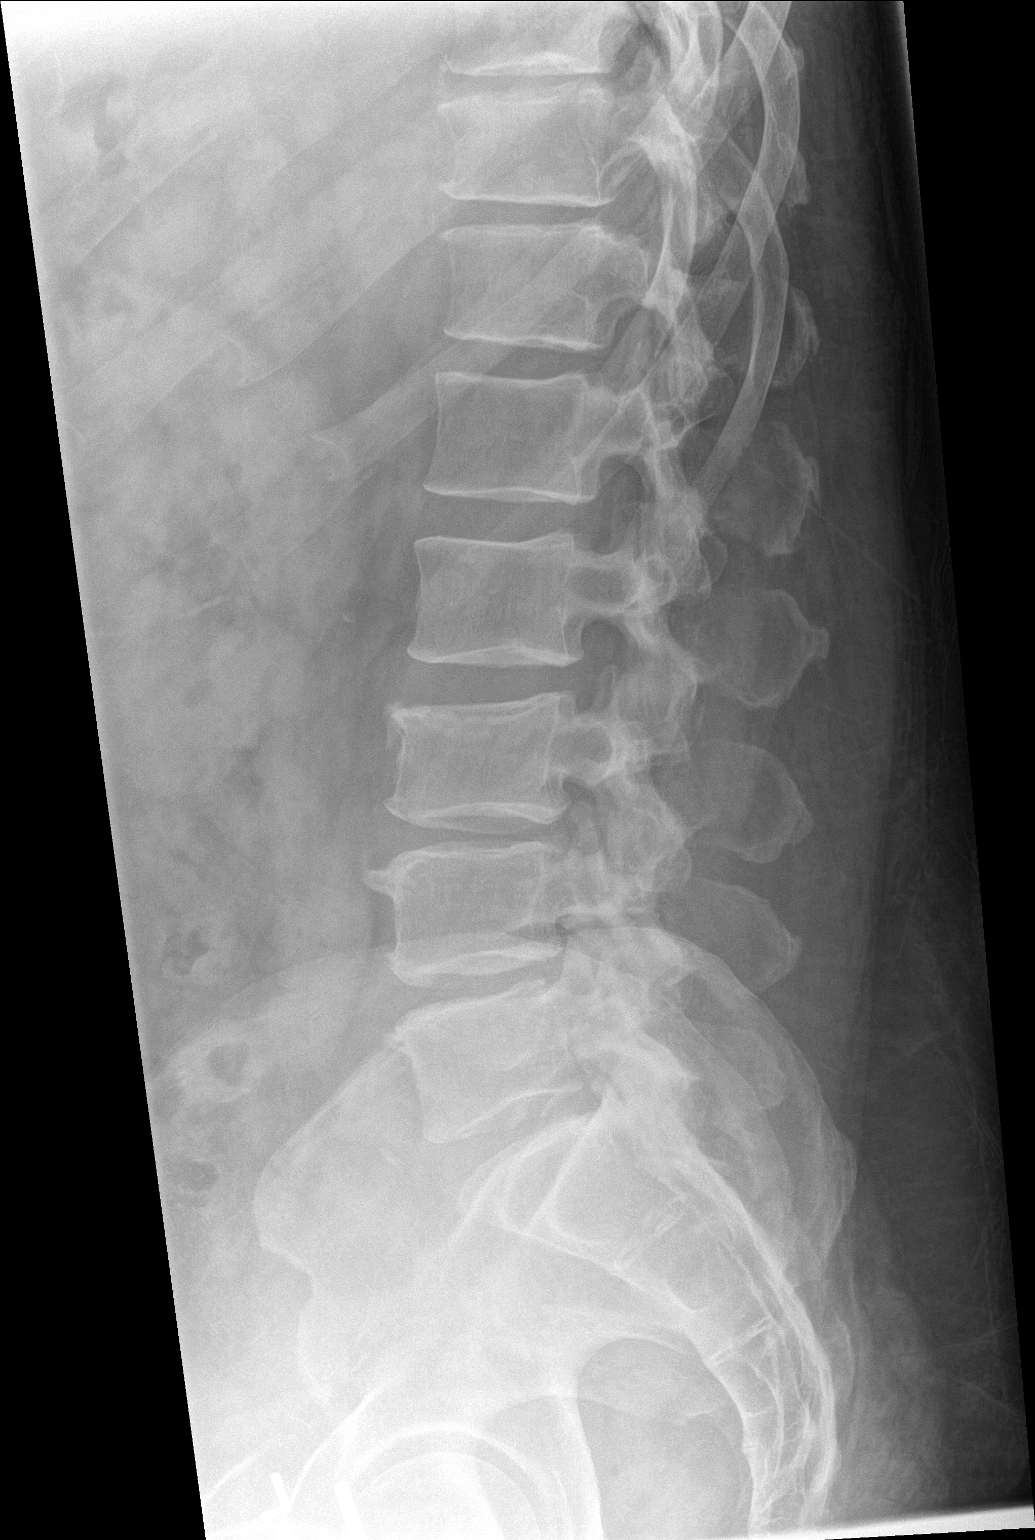
[im 3/3]
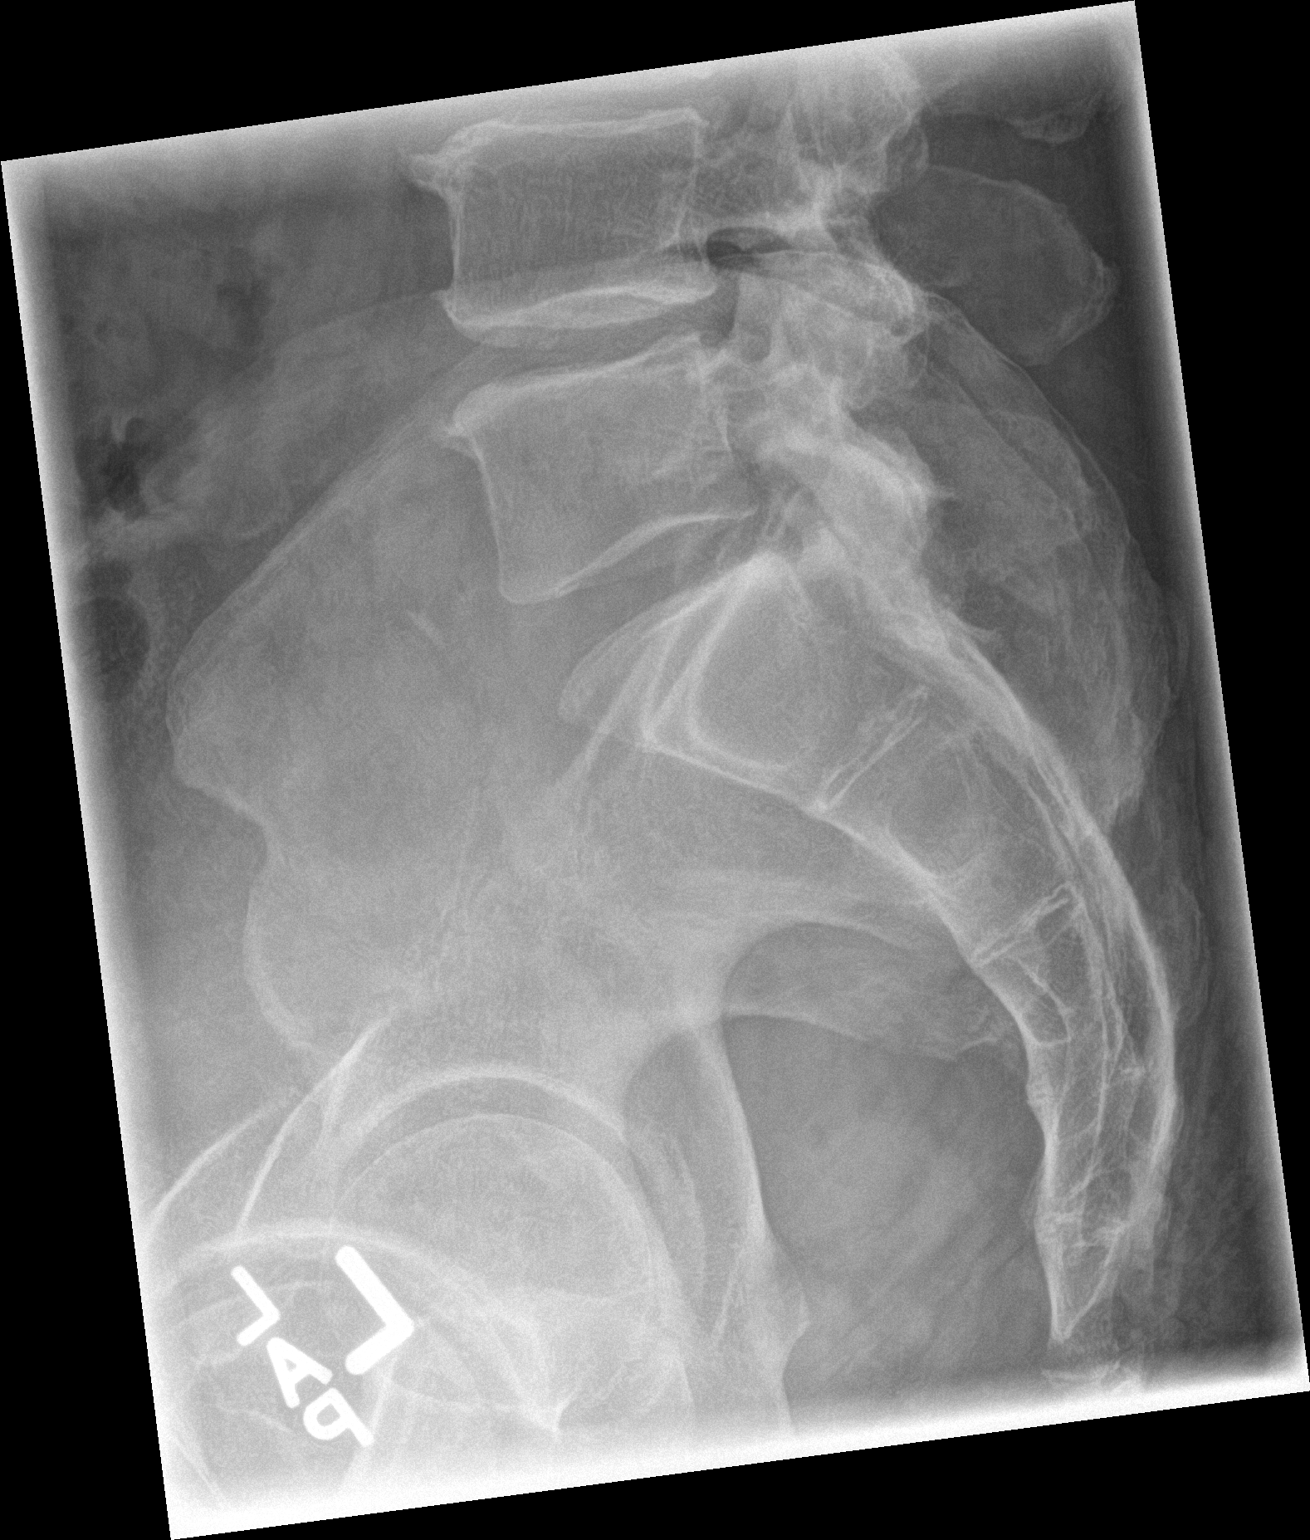

[3 of 3 positions shown; findings below may reference images not displayed]

FINDINGS: Six lumbar type vertebral bodies. These will be labeled T12 through
L5 for the purposes of this study. Sacroiliac joints are symmetric.
Minimal superior endplate irregularity at L3 with sclerosis.
Intervertebral disc heights are maintained. Facet arthropathy
involves L5-S1.
IMPRESSION: Six lumbar type vertebral bodies, labeled T12 through L5. Presuming
this nomenclature, minimal superior endplate irregularity and
vertebral body height loss at L3. Given sclerosis, favored to be
least partially remote. If there is local pain in this area,
consider lumbar spine MRI.

## 2016-10-17 IMAGING — CR DG CHEST 2V
1 series · 2 of 2 positions shown · non-contrast
Comparison: 01/26/2013

CLINICAL DATA: Initial encounter for Fell through 9ft ceiling today
while working on house. No previous injury or surgery. Pain in lower
back, left ankle swollen and painful, right sided chest wall pain.
Shielded. Smoker.

EXAM:
CHEST  2 VIEW

[Series 1: dg chest 2 view · 0.14mm/px · 2 of 2 slices shown]
[im 1/2]
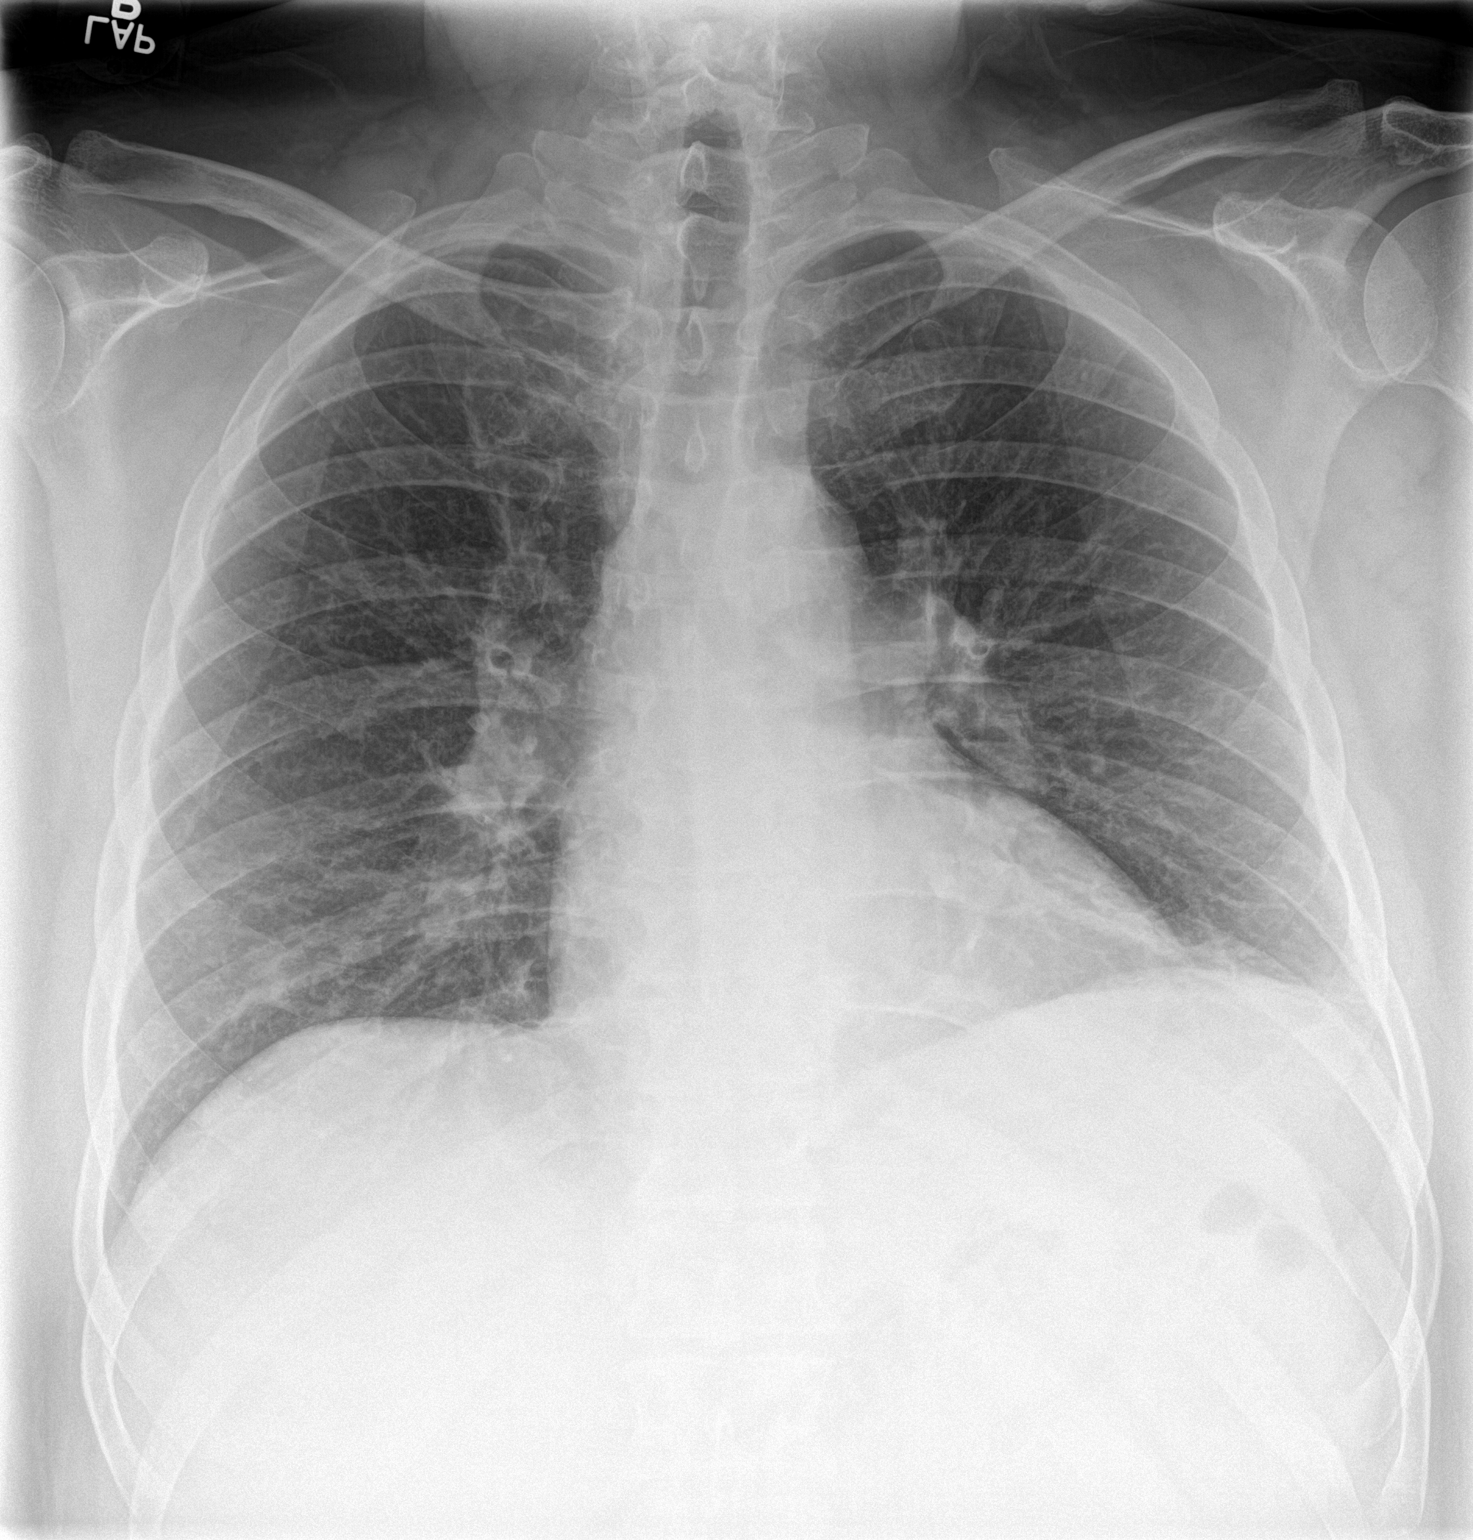
[im 2/2]
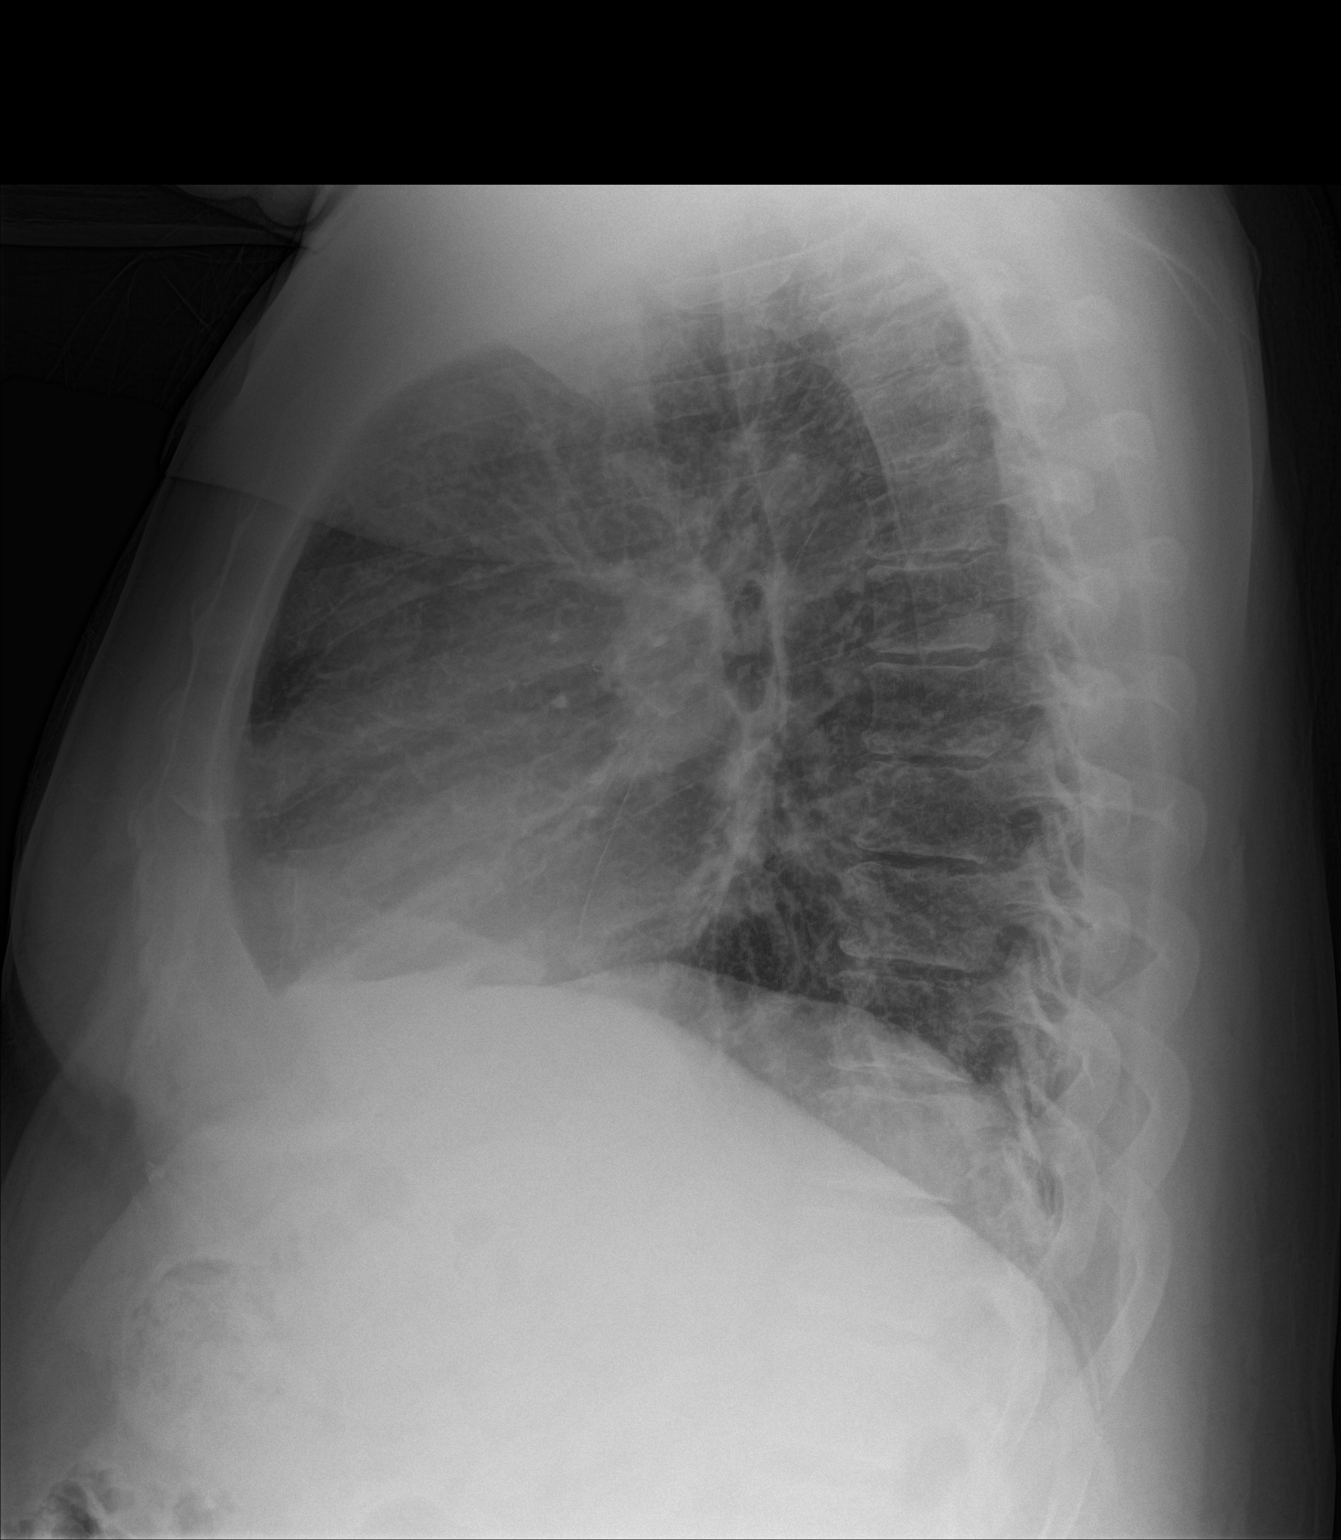

[2 of 2 positions shown; findings below may reference images not displayed]

FINDINGS: Lateral view degraded by patient arm position. Mild thoracic
spondylosis. Midline trachea. Borderline cardiomegaly. Mediastinal
contours otherwise within normal limits. Mild left hemidiaphragm
elevation. No pleural effusion or pneumothorax. Mild left base
volume loss. Diffuse peribronchial thickening.
IMPRESSION: No acute or posttraumatic deformity identified.

Peribronchial thickening which may relate to chronic bronchitis or
smoking.

## 2022-01-21 ENCOUNTER — Inpatient Hospital Stay: Payer: Self-pay

## 2022-01-21 ENCOUNTER — Other Ambulatory Visit: Payer: Self-pay

## 2022-01-21 ENCOUNTER — Inpatient Hospital Stay
Admission: EM | Admit: 2022-01-21 | Discharge: 2022-02-13 | DRG: 207 | Disposition: A | Discharge status: Home / Self Care | Payer: Self-pay | Attending: Internal Medicine | Admitting: Internal Medicine

## 2022-01-21 ENCOUNTER — Inpatient Hospital Stay
Admit: 2022-01-21 | Discharge: 2022-01-21 | Disposition: A | Discharge status: Home / Self Care | Payer: Self-pay | Attending: Pulmonary Disease | Admitting: Pulmonary Disease

## 2022-01-21 ENCOUNTER — Inpatient Hospital Stay: Admit: 2022-01-21 | Payer: Self-pay

## 2022-01-21 ENCOUNTER — Emergency Department: Payer: Self-pay

## 2022-01-21 DIAGNOSIS — E875 Hyperkalemia: Secondary | ICD-10-CM | POA: Diagnosis not present

## 2022-01-21 DIAGNOSIS — J9602 Acute respiratory failure with hypercapnia: Principal | ICD-10-CM | POA: Diagnosis present

## 2022-01-21 DIAGNOSIS — E8729 Other acidosis: Secondary | ICD-10-CM | POA: Diagnosis present

## 2022-01-21 DIAGNOSIS — J80 Acute respiratory distress syndrome: Principal | ICD-10-CM | POA: Diagnosis present

## 2022-01-21 DIAGNOSIS — I700 Atherosclerosis of aorta: Secondary | ICD-10-CM | POA: Diagnosis present

## 2022-01-21 DIAGNOSIS — R54 Age-related physical debility: Secondary | ICD-10-CM | POA: Diagnosis present

## 2022-01-21 DIAGNOSIS — J440 Chronic obstructive pulmonary disease with acute lower respiratory infection: Secondary | ICD-10-CM | POA: Diagnosis present

## 2022-01-21 DIAGNOSIS — I4891 Unspecified atrial fibrillation: Secondary | ICD-10-CM | POA: Diagnosis not present

## 2022-01-21 DIAGNOSIS — J9601 Acute respiratory failure with hypoxia: Secondary | ICD-10-CM | POA: Diagnosis present

## 2022-01-21 DIAGNOSIS — B9789 Other viral agents as the cause of diseases classified elsewhere: Secondary | ICD-10-CM | POA: Diagnosis present

## 2022-01-21 DIAGNOSIS — J324 Chronic pansinusitis: Secondary | ICD-10-CM | POA: Diagnosis present

## 2022-01-21 DIAGNOSIS — Z4659 Encounter for fitting and adjustment of other gastrointestinal appliance and device: Secondary | ICD-10-CM

## 2022-01-21 DIAGNOSIS — Z978 Presence of other specified devices: Secondary | ICD-10-CM

## 2022-01-21 DIAGNOSIS — E663 Overweight: Secondary | ICD-10-CM | POA: Diagnosis present

## 2022-01-21 DIAGNOSIS — J156 Pneumonia due to other aerobic Gram-negative bacteria: Secondary | ICD-10-CM | POA: Diagnosis not present

## 2022-01-21 DIAGNOSIS — J9621 Acute and chronic respiratory failure with hypoxia: Secondary | ICD-10-CM | POA: Diagnosis present

## 2022-01-21 DIAGNOSIS — B370 Candidal stomatitis: Secondary | ICD-10-CM | POA: Diagnosis not present

## 2022-01-21 DIAGNOSIS — K5900 Constipation, unspecified: Secondary | ICD-10-CM | POA: Diagnosis not present

## 2022-01-21 DIAGNOSIS — E669 Obesity, unspecified: Secondary | ICD-10-CM | POA: Diagnosis present

## 2022-01-21 DIAGNOSIS — F17200 Nicotine dependence, unspecified, uncomplicated: Secondary | ICD-10-CM | POA: Diagnosis present

## 2022-01-21 DIAGNOSIS — J45901 Unspecified asthma with (acute) exacerbation: Secondary | ICD-10-CM | POA: Diagnosis present

## 2022-01-21 DIAGNOSIS — F17210 Nicotine dependence, cigarettes, uncomplicated: Secondary | ICD-10-CM | POA: Diagnosis present

## 2022-01-21 DIAGNOSIS — Z01818 Encounter for other preprocedural examination: Secondary | ICD-10-CM

## 2022-01-21 DIAGNOSIS — Z95828 Presence of other vascular implants and grafts: Secondary | ICD-10-CM

## 2022-01-21 DIAGNOSIS — J129 Viral pneumonia, unspecified: Secondary | ICD-10-CM | POA: Diagnosis present

## 2022-01-21 DIAGNOSIS — R0902 Hypoxemia: Secondary | ICD-10-CM

## 2022-01-21 DIAGNOSIS — J441 Chronic obstructive pulmonary disease with (acute) exacerbation: Secondary | ICD-10-CM | POA: Diagnosis present

## 2022-01-21 DIAGNOSIS — Z789 Other specified health status: Secondary | ICD-10-CM

## 2022-01-21 DIAGNOSIS — Z6832 Body mass index (BMI) 32.0-32.9, adult: Secondary | ICD-10-CM

## 2022-01-21 DIAGNOSIS — N179 Acute kidney failure, unspecified: Secondary | ICD-10-CM | POA: Diagnosis not present

## 2022-01-21 DIAGNOSIS — Z720 Tobacco use: Secondary | ICD-10-CM | POA: Diagnosis present

## 2022-01-21 DIAGNOSIS — J449 Chronic obstructive pulmonary disease, unspecified: Secondary | ICD-10-CM | POA: Diagnosis present

## 2022-01-21 DIAGNOSIS — R1312 Dysphagia, oropharyngeal phase: Secondary | ICD-10-CM | POA: Diagnosis present

## 2022-01-21 DIAGNOSIS — J45902 Unspecified asthma with status asthmaticus: Secondary | ICD-10-CM | POA: Diagnosis present

## 2022-01-21 DIAGNOSIS — R609 Edema, unspecified: Secondary | ICD-10-CM

## 2022-01-21 DIAGNOSIS — A419 Sepsis, unspecified organism: Secondary | ICD-10-CM | POA: Diagnosis present

## 2022-01-21 DIAGNOSIS — Z452 Encounter for adjustment and management of vascular access device: Secondary | ICD-10-CM

## 2022-01-21 DIAGNOSIS — E878 Other disorders of electrolyte and fluid balance, not elsewhere classified: Secondary | ICD-10-CM | POA: Diagnosis present

## 2022-01-21 DIAGNOSIS — J9622 Acute and chronic respiratory failure with hypercapnia: Secondary | ICD-10-CM | POA: Diagnosis present

## 2022-01-21 DIAGNOSIS — I493 Ventricular premature depolarization: Secondary | ICD-10-CM | POA: Diagnosis present

## 2022-01-21 DIAGNOSIS — E871 Hypo-osmolality and hyponatremia: Secondary | ICD-10-CM | POA: Diagnosis not present

## 2022-01-21 DIAGNOSIS — I491 Atrial premature depolarization: Secondary | ICD-10-CM | POA: Diagnosis present

## 2022-01-21 DIAGNOSIS — R739 Hyperglycemia, unspecified: Secondary | ICD-10-CM | POA: Diagnosis present

## 2022-01-21 DIAGNOSIS — R5381 Other malaise: Secondary | ICD-10-CM | POA: Diagnosis not present

## 2022-01-21 DIAGNOSIS — Z20822 Contact with and (suspected) exposure to covid-19: Secondary | ICD-10-CM | POA: Diagnosis present

## 2022-01-21 DIAGNOSIS — R1310 Dysphagia, unspecified: Secondary | ICD-10-CM

## 2022-01-21 HISTORY — DX: Tobacco use: Z72.0

## 2022-01-21 LAB — RESPIRATORY PANEL BY PCR

## 2022-01-21 LAB — BLOOD GAS, ARTERIAL
Acid-Base Excess: 3.1 mmol/L — ABNORMAL HIGH (ref 0.0–2.0)
Acid-Base Excess: 2.3 mmol/L — ABNORMAL HIGH (ref 0.0–2.0)
Acid-Base Excess: 3.5 mmol/L — ABNORMAL HIGH (ref 0.0–2.0)
Acid-Base Excess: 2.5 mmol/L — ABNORMAL HIGH (ref 0.0–2.0)
Bicarbonate: 35.7 mmol/L — ABNORMAL HIGH (ref 20.0–28.0)
Bicarbonate: 33.9 mmol/L — ABNORMAL HIGH (ref 20.0–28.0)
Bicarbonate: 34 mmol/L — ABNORMAL HIGH (ref 20.0–28.0)
Bicarbonate: 33.6 mmol/L — ABNORMAL HIGH (ref 20.0–28.0)
FIO2: 0.7 %
FIO2: 0.6 %
FIO2: 0.5 %
FIO2: 0.4 %
MECHVT: 480 mL
MECHVT: 480 mL
Mechanical Rate: 20
Mechanical Rate: 24
Mechanical Rate: 28
Mechanical Rate: 28
O2 Saturation: 99.6 %
O2 Saturation: 99.6 %
O2 Saturation: 99.8 %
O2 Saturation: 97.6 %
PEEP: 8 cmH2O
PEEP: 8 cmH2O
PEEP: 10 cmH2O
PEEP: 10 cmH2O
Patient temperature: 37
Patient temperature: 37
Patient temperature: 37
Patient temperature: 37
Pressure control: 45 cmH2O
Pressure control: 43 cmH2O
RATE: 20 resp/min
RATE: 24 resp/min
RATE: 28 resp/min
RATE: 28 resp/min
pCO2 arterial: 105 mmHg (ref 32–48)
pCO2 arterial: 93 mmHg (ref 32–48)
pCO2 arterial: 83 mmHg (ref 32–48)
pCO2 arterial: 88 mmHg (ref 32–48)
pH, Arterial: 7.14 — CL (ref 7.35–7.45)
pH, Arterial: 7.17 — CL (ref 7.35–7.45)
pH, Arterial: 7.22 — ABNORMAL LOW (ref 7.35–7.45)
pH, Arterial: 7.19 — CL (ref 7.35–7.45)
pO2, Arterial: 156 mmHg — ABNORMAL HIGH (ref 83–108)
pO2, Arterial: 111 mmHg — ABNORMAL HIGH (ref 83–108)
pO2, Arterial: 145 mmHg — ABNORMAL HIGH (ref 83–108)
pO2, Arterial: 77 mmHg — ABNORMAL LOW (ref 83–108)

## 2022-01-21 LAB — URINALYSIS, COMPLETE (UACMP) WITH MICROSCOPIC
Bacteria, UA: NONE SEEN
Bilirubin Urine: NEGATIVE
Glucose, UA: NEGATIVE mg/dL
Hgb urine dipstick: NEGATIVE
Ketones, ur: NEGATIVE mg/dL
Leukocytes,Ua: NEGATIVE
Nitrite: NEGATIVE
Protein, ur: NEGATIVE mg/dL
Specific Gravity, Urine: 1.026 (ref 1.005–1.030)
Squamous Epithelial / HPF: NONE SEEN (ref 0–5)
pH: 5 (ref 5.0–8.0)

## 2022-01-21 LAB — CBC WITH DIFFERENTIAL/PLATELET
Abs Immature Granulocytes: 0.04 10*3/uL (ref 0.00–0.07)
Basophils Absolute: 0.1 10*3/uL (ref 0.0–0.1)
Basophils Relative: 1 %
Eosinophils Absolute: 0.5 10*3/uL (ref 0.0–0.5)
Eosinophils Relative: 6 %
HCT: 50.9 % (ref 39.0–52.0)
Hemoglobin: 15.9 g/dL (ref 13.0–17.0)
Immature Granulocytes: 1 %
Lymphocytes Relative: 11 %
Lymphs Abs: 0.9 10*3/uL (ref 0.7–4.0)
MCH: 30.2 pg (ref 26.0–34.0)
MCHC: 31.2 g/dL (ref 30.0–36.0)
MCV: 96.8 fL (ref 80.0–100.0)
Monocytes Absolute: 0.5 10*3/uL (ref 0.1–1.0)
Monocytes Relative: 6 %
Neutro Abs: 6.4 10*3/uL (ref 1.7–7.7)
Neutrophils Relative %: 75 %
Platelets: 191 10*3/uL (ref 150–400)
RBC: 5.26 MIL/uL (ref 4.22–5.81)
RDW: 14 % (ref 11.5–15.5)
WBC: 8.4 10*3/uL (ref 4.0–10.5)
nRBC: 0 % (ref 0.0–0.2)

## 2022-01-21 LAB — ECHOCARDIOGRAM COMPLETE
AR max vel: 4.1 cm2
AV Area VTI: 3.82 cm2
AV Area mean vel: 3.3 cm2
AV Mean grad: 2 mmHg
AV Peak grad: 2.8 mmHg
Ao pk vel: 0.83 m/s
Area-P 1/2: 3.42 cm2
Height: 72 in
S' Lateral: 3.3 cm
Weight: 3781.33 oz

## 2022-01-21 LAB — GLUCOSE, CAPILLARY
Glucose-Capillary: 143 mg/dL — ABNORMAL HIGH (ref 70–99)
Glucose-Capillary: 169 mg/dL — ABNORMAL HIGH (ref 70–99)
Glucose-Capillary: 170 mg/dL — ABNORMAL HIGH (ref 70–99)
Glucose-Capillary: 172 mg/dL — ABNORMAL HIGH (ref 70–99)
Glucose-Capillary: 174 mg/dL — ABNORMAL HIGH (ref 70–99)
Glucose-Capillary: 175 mg/dL — ABNORMAL HIGH (ref 70–99)

## 2022-01-21 LAB — BLOOD GAS, VENOUS
Acid-Base Excess: 4.1 mmol/L — ABNORMAL HIGH (ref 0.0–2.0)
Acid-Base Excess: 5.6 mmol/L — ABNORMAL HIGH (ref 0.0–2.0)
Bicarbonate: 36.1 mmol/L — ABNORMAL HIGH (ref 20.0–28.0)
Bicarbonate: 37.4 mmol/L — ABNORMAL HIGH (ref 20.0–28.0)
Delivery systems: POSITIVE
FIO2: 50 %
O2 Saturation: 89.4 %
O2 Saturation: 91.8 %
Patient temperature: 37
Patient temperature: 37
RATE: 8 resp/min
pCO2, Ven: 99 mmHg (ref 44–60)
pCO2, Ven: 98 mmHg (ref 44–60)
pH, Ven: 7.17 — CL (ref 7.25–7.43)
pH, Ven: 7.19 — CL (ref 7.25–7.43)
pO2, Ven: 64 mmHg — ABNORMAL HIGH (ref 32–45)
pO2, Ven: 64 mmHg — ABNORMAL HIGH (ref 32–45)

## 2022-01-21 LAB — RENAL FUNCTION PANEL
Albumin: 4.1 g/dL (ref 3.5–5.0)
Anion gap: 5 (ref 5–15)
BUN: 18 mg/dL (ref 6–20)
CO2: 33 mmol/L — ABNORMAL HIGH (ref 22–32)
Calcium: 7.8 mg/dL — ABNORMAL LOW (ref 8.9–10.3)
Chloride: 101 mmol/L (ref 98–111)
Creatinine, Ser: 0.9 mg/dL (ref 0.61–1.24)
GFR, Estimated: >60 mL/min (ref >60)
Glucose, Bld: 167 mg/dL — ABNORMAL HIGH (ref 70–99)
Phosphorus: 5.5 mg/dL — ABNORMAL HIGH (ref 2.5–4.6)
Potassium: 5.6 mmol/L — ABNORMAL HIGH (ref 3.5–5.1)
Sodium: 139 mmol/L (ref 135–145)

## 2022-01-21 LAB — BASIC METABOLIC PANEL
Anion gap: 7 (ref 5–15)
BUN: 16 mg/dL (ref 6–20)
CO2: 34 mmol/L — ABNORMAL HIGH (ref 22–32)
Calcium: 8.4 mg/dL — ABNORMAL LOW (ref 8.9–10.3)
Chloride: 102 mmol/L (ref 98–111)
Creatinine, Ser: 0.62 mg/dL (ref 0.61–1.24)
GFR, Estimated: >60 mL/min (ref >60)
Glucose, Bld: 129 mg/dL — ABNORMAL HIGH (ref 70–99)
Potassium: 4.1 mmol/L (ref 3.5–5.1)
Sodium: 143 mmol/L (ref 135–145)

## 2022-01-21 LAB — HIV ANTIBODY (ROUTINE TESTING W REFLEX): HIV Screen 4th Generation wRfx: NONREACTIVE

## 2022-01-21 LAB — MAGNESIUM
Magnesium: 2.6 mg/dL — ABNORMAL HIGH (ref 1.7–2.4)
Magnesium: 2.6 mg/dL — ABNORMAL HIGH (ref 1.7–2.4)
Magnesium: 2.4 mg/dL (ref 1.7–2.4)

## 2022-01-21 LAB — RESP PANEL BY RT-PCR (FLU A&B, COVID) ARPGX2
Influenza A by PCR: NEGATIVE
Influenza B by PCR: NEGATIVE
SARS Coronavirus 2 by RT PCR: NEGATIVE

## 2022-01-21 LAB — TROPONIN I (HIGH SENSITIVITY)
Troponin I (High Sensitivity): 9 ng/L (ref <18)
Troponin I (High Sensitivity): 8 ng/L (ref <18)
Troponin I (High Sensitivity): 7 ng/L (ref <18)
Troponin I (High Sensitivity): 11 ng/L (ref <18)

## 2022-01-21 LAB — PROCALCITONIN: Procalcitonin: <0.1 ng/mL

## 2022-01-21 LAB — POTASSIUM: Potassium: 5.3 mmol/L — ABNORMAL HIGH (ref 3.5–5.1)

## 2022-01-21 LAB — MRSA NEXT GEN BY PCR, NASAL: MRSA by PCR Next Gen: NOT DETECTED

## 2022-01-21 LAB — BRAIN NATRIURETIC PEPTIDE: B Natriuretic Peptide: 37.5 pg/mL (ref 0.0–100.0)

## 2022-01-21 LAB — LACTIC ACID, PLASMA
Lactic Acid, Venous: 1 mmol/L (ref 0.5–1.9)
Lactic Acid, Venous: 1.1 mmol/L (ref 0.5–1.9)

## 2022-01-21 MED ORDER — POLYETHYLENE GLYCOL 3350 17 G PO PACK
17.0000 g | PACK | Freq: Every day | ORAL | Status: DC
  Administered 2022-01-23 – 2022-02-03 (×11): 17 g
  Filled 2022-01-21 (×11): qty 1

## 2022-01-21 MED ORDER — METOPROLOL TARTRATE 5 MG/5ML IV SOLN
5.0000 mg | Freq: Once | INTRAVENOUS | Status: AC
  Administered 2022-01-21: 5 mg via INTRAVENOUS
  Filled 2022-01-21: qty 5

## 2022-01-21 MED ORDER — METHYLPREDNISOLONE SODIUM SUCC 125 MG IJ SOLR
60.0000 mg | Freq: Two times a day (BID) | INTRAMUSCULAR | Status: DC
  Administered 2022-01-21 – 2022-01-24 (×6): 60 mg via INTRAVENOUS
  Filled 2022-01-21 (×6): qty 2

## 2022-01-21 MED ORDER — SODIUM CHLORIDE 0.9 % IV SOLN
0.0000 ug/kg/min | INTRAVENOUS | Status: DC
  Administered 2022-01-21: 3 ug/kg/min via INTRAVENOUS
  Administered 2022-01-21: 4 ug/kg/min via INTRAVENOUS
  Administered 2022-01-22: 2 ug/kg/min via INTRAVENOUS
  Administered 2022-01-22: 4 ug/kg/min via INTRAVENOUS
  Administered 2022-01-22: 5 ug/kg/min via INTRAVENOUS
  Administered 2022-01-23: 3 ug/kg/min via INTRAVENOUS
  Filled 2022-01-21 (×6): qty 20

## 2022-01-21 MED ORDER — FAMOTIDINE 40 MG/5ML PO SUSR
20.0000 mg | Freq: Two times a day (BID) | ORAL | Status: DC
  Administered 2022-01-21: 20 mg
  Filled 2022-01-21 (×3): qty 2.5

## 2022-01-21 MED ORDER — STERILE WATER FOR INJECTION IJ SOLN
INTRAMUSCULAR | Status: AC
  Filled 2022-01-21: qty 10

## 2022-01-21 MED ORDER — ARTIFICIAL TEARS OPHTHALMIC OINT
1.0000 "application " | TOPICAL_OINTMENT | Freq: Three times a day (TID) | OPHTHALMIC | Status: DC
  Administered 2022-01-21 – 2022-01-23 (×7): 1 via OPHTHALMIC
  Filled 2022-01-21: qty 1

## 2022-01-21 MED ORDER — MAGNESIUM SULFATE 2 GM/50ML IV SOLN
2.0000 g | Freq: Once | INTRAVENOUS | Status: AC
  Administered 2022-01-21: 2 g via INTRAVENOUS
  Filled 2022-01-21: qty 50

## 2022-01-21 MED ORDER — CHLORHEXIDINE GLUCONATE 0.12% ORAL RINSE (MEDLINE KIT)
15.0000 mL | Freq: Two times a day (BID) | OROMUCOSAL | Status: DC
  Administered 2022-01-21 – 2022-01-27 (×13): 15 mL via OROMUCOSAL

## 2022-01-21 MED ORDER — PROPOFOL 1000 MG/100ML IV EMUL
5.0000 ug/kg/min | INTRAVENOUS | Status: DC
  Administered 2022-01-21: 35 ug/kg/min via INTRAVENOUS
  Administered 2022-01-21: 15 ug/kg/min via INTRAVENOUS
  Administered 2022-01-21: 30 ug/kg/min via INTRAVENOUS
  Administered 2022-01-21: 10 ug/kg/min via INTRAVENOUS
  Administered 2022-01-21: 40 ug/kg/min via INTRAVENOUS
  Administered 2022-01-22 (×2): 55 ug/kg/min via INTRAVENOUS
  Administered 2022-01-22 (×2): 50 ug/kg/min via INTRAVENOUS
  Administered 2022-01-22: 44 ug/kg/min via INTRAVENOUS
  Administered 2022-01-22 – 2022-01-23 (×7): 55 ug/kg/min via INTRAVENOUS
  Administered 2022-01-23: 45 ug/kg/min via INTRAVENOUS
  Administered 2022-01-23 (×3): 55 ug/kg/min via INTRAVENOUS
  Administered 2022-01-24 (×4): 65 ug/kg/min via INTRAVENOUS
  Administered 2022-01-24: 50 ug/kg/min via INTRAVENOUS
  Administered 2022-01-24 (×4): 65 ug/kg/min via INTRAVENOUS
  Administered 2022-01-25: 50 ug/kg/min via INTRAVENOUS
  Administered 2022-01-25: 55 ug/kg/min via INTRAVENOUS
  Administered 2022-01-25: 45 ug/kg/min via INTRAVENOUS
  Administered 2022-01-25: 20 ug/kg/min via INTRAVENOUS
  Administered 2022-01-25: 40 ug/kg/min via INTRAVENOUS
  Administered 2022-01-25: 50 ug/kg/min via INTRAVENOUS
  Administered 2022-01-26 (×2): 15 ug/kg/min via INTRAVENOUS
  Administered 2022-01-26: 30 ug/kg/min via INTRAVENOUS
  Administered 2022-01-27: 25 ug/kg/min via INTRAVENOUS
  Administered 2022-01-29 (×2): 40 ug/kg/min via INTRAVENOUS
  Administered 2022-01-29 (×2): 25 ug/kg/min via INTRAVENOUS
  Administered 2022-01-29 (×2): 40 ug/kg/min via INTRAVENOUS
  Administered 2022-01-30 – 2022-01-31 (×7): 45 ug/kg/min via INTRAVENOUS
  Administered 2022-01-31: 35 ug/kg/min via INTRAVENOUS
  Administered 2022-01-31: 45 ug/kg/min via INTRAVENOUS
  Administered 2022-01-31: 40 ug/kg/min via INTRAVENOUS
  Administered 2022-01-31 (×2): 45 ug/kg/min via INTRAVENOUS
  Administered 2022-02-01 (×2): 40 ug/kg/min via INTRAVENOUS
  Administered 2022-02-01: 5 ug/kg/min via INTRAVENOUS
  Administered 2022-02-02: 35 ug/kg/min via INTRAVENOUS
  Administered 2022-02-02 (×2): 45 ug/kg/min via INTRAVENOUS
  Administered 2022-02-02: 35 ug/kg/min via INTRAVENOUS
  Administered 2022-02-02: 45 ug/kg/min via INTRAVENOUS
  Administered 2022-02-03 (×3): 40 ug/kg/min via INTRAVENOUS
  Administered 2022-02-03 (×2): 45 ug/kg/min via INTRAVENOUS
  Administered 2022-02-03: 14:00:00 40 ug/kg/min via INTRAVENOUS
  Administered 2022-02-04: 20 ug/kg/min via INTRAVENOUS
  Administered 2022-02-04: 30 ug/kg/min via INTRAVENOUS
  Administered 2022-02-04: 20 ug/kg/min via INTRAVENOUS
  Administered 2022-02-04: 35 ug/kg/min via INTRAVENOUS
  Administered 2022-02-05: 40 ug/kg/min via INTRAVENOUS
  Administered 2022-02-05 (×2): 50 ug/kg/min via INTRAVENOUS
  Filled 2022-01-21 (×80): qty 100

## 2022-01-21 MED ORDER — FENTANYL 2500MCG IN NS 250ML (10MCG/ML) PREMIX INFUSION
0.0000 ug/h | INTRAVENOUS | Status: DC
  Administered 2022-01-21: 50 ug/h via INTRAVENOUS
  Administered 2022-01-21: 250 ug/h via INTRAVENOUS
  Administered 2022-01-21: 25 ug/h via INTRAVENOUS
  Administered 2022-01-22 (×2): 350 ug/h via INTRAVENOUS
  Administered 2022-01-22: 300 ug/h via INTRAVENOUS
  Administered 2022-01-23 – 2022-01-25 (×7): 350 ug/h via INTRAVENOUS
  Administered 2022-01-25: 275 ug/h via INTRAVENOUS
  Administered 2022-01-25: 250 ug/h via INTRAVENOUS
  Administered 2022-01-26 – 2022-01-27 (×2): 200 ug/h via INTRAVENOUS
  Administered 2022-01-27: 175 ug/h via INTRAVENOUS
  Administered 2022-01-28 (×2): 250 ug/h via INTRAVENOUS
  Administered 2022-01-29 – 2022-01-30 (×6): 300 ug/h via INTRAVENOUS
  Administered 2022-01-31: 250 ug/h via INTRAVENOUS
  Filled 2022-01-21 (×27): qty 250

## 2022-01-21 MED ORDER — ROCURONIUM BROMIDE 50 MG/5ML IV SOLN
100.0000 mg | Freq: Once | INTRAVENOUS | Status: AC
  Filled 2022-01-21: qty 10

## 2022-01-21 MED ORDER — IPRATROPIUM-ALBUTEROL 0.5-2.5 (3) MG/3ML IN SOLN: 3.0000 mL | Freq: Once | RESPIRATORY_TRACT | Status: AC

## 2022-01-21 MED ORDER — IPRATROPIUM-ALBUTEROL 0.5-2.5 (3) MG/3ML IN SOLN
RESPIRATORY_TRACT | Status: AC
  Administered 2022-01-21: 3 mL via RESPIRATORY_TRACT
  Filled 2022-01-21: qty 9

## 2022-01-21 MED ORDER — AMIODARONE HCL IN DEXTROSE 360-4.14 MG/200ML-% IV SOLN
60.0000 mg/h | INTRAVENOUS | Status: DC
  Administered 2022-01-21 (×2): 60 mg/h via INTRAVENOUS
  Filled 2022-01-21 (×2): qty 200

## 2022-01-21 MED ORDER — AMIODARONE HCL IN DEXTROSE 360-4.14 MG/200ML-% IV SOLN
60.0000 mg/h | INTRAVENOUS | Status: AC
Start: 2022-01-21 — End: 2022-01-22
  Administered 2022-01-22: 60 mg/h via INTRAVENOUS
  Filled 2022-01-21: qty 200

## 2022-01-21 MED ORDER — DOCUSATE SODIUM 100 MG PO CAPS: 100.0000 mg | ORAL_CAPSULE | Freq: Two times a day (BID) | ORAL | Status: DC | PRN

## 2022-01-21 MED ORDER — AMIODARONE HCL IN DEXTROSE 360-4.14 MG/200ML-% IV SOLN: 30.0000 mg/h | INTRAVENOUS | Status: DC

## 2022-01-21 MED ORDER — CHLORHEXIDINE GLUCONATE CLOTH 2 % EX PADS
6.0000 | MEDICATED_PAD | Freq: Every day | CUTANEOUS | Status: DC
  Administered 2022-01-21 – 2022-01-27 (×7): 6 via TOPICAL

## 2022-01-21 MED ORDER — STERILE WATER FOR INJECTION IV SOLN
INTRAVENOUS | Status: DC
  Filled 2022-01-21 (×4): qty 1000
  Filled 2022-01-21: qty 150

## 2022-01-21 MED ORDER — AZITHROMYCIN 500 MG PO TABS
500.0000 mg | ORAL_TABLET | Freq: Every day | ORAL | Status: AC
  Administered 2022-01-21: 500 mg via ORAL
  Filled 2022-01-21: qty 1

## 2022-01-21 MED ORDER — IOHEXOL 350 MG/ML SOLN
75.0000 mL | Freq: Once | INTRAVENOUS | Status: AC | PRN
  Administered 2022-01-21: 75 mL via INTRAVENOUS

## 2022-01-21 MED ORDER — IPRATROPIUM-ALBUTEROL 0.5-2.5 (3) MG/3ML IN SOLN
3.0000 mL | Freq: Once | RESPIRATORY_TRACT | Status: AC
  Administered 2022-01-21: 3 mL via RESPIRATORY_TRACT

## 2022-01-21 MED ORDER — VECURONIUM BROMIDE 10 MG IV SOLR: 10.0000 mg | Freq: Once | INTRAVENOUS | Status: AC

## 2022-01-21 MED ORDER — DM-GUAIFENESIN ER 30-600 MG PO TB12: 1.0000 | ORAL_TABLET | Freq: Two times a day (BID) | ORAL | Status: DC | PRN

## 2022-01-21 MED ORDER — DOCUSATE SODIUM 50 MG/5ML PO LIQD
100.0000 mg | Freq: Two times a day (BID) | ORAL | Status: DC
  Administered 2022-01-23 – 2022-01-24 (×4): 100 mg
  Filled 2022-01-21 (×6): qty 10

## 2022-01-21 MED ORDER — KETAMINE HCL 10 MG/ML IJ SOLN
INTRAMUSCULAR | Status: AC
  Filled 2022-01-21: qty 1

## 2022-01-21 MED ORDER — NICOTINE 21 MG/24HR TD PT24
21.0000 mg | MEDICATED_PATCH | Freq: Every day | TRANSDERMAL | Status: DC
  Administered 2022-01-21: 21 mg via TRANSDERMAL
  Filled 2022-01-21: qty 1

## 2022-01-21 MED ORDER — CISATRACURIUM BOLUS VIA INFUSION
10.0000 mg | Freq: Once | INTRAVENOUS | Status: AC
  Administered 2022-01-23: 10 mg via INTRAVENOUS
  Filled 2022-01-21: qty 10

## 2022-01-21 MED ORDER — ENOXAPARIN SODIUM 40 MG/0.4ML IJ SOSY
40.0000 mg | PREFILLED_SYRINGE | INTRAMUSCULAR | Status: DC
  Administered 2022-01-21 – 2022-01-23 (×3): 40 mg via SUBCUTANEOUS
  Filled 2022-01-21 (×3): qty 0.4

## 2022-01-21 MED ORDER — FENTANYL CITRATE PF 50 MCG/ML IJ SOSY
50.0000 ug | PREFILLED_SYRINGE | INTRAMUSCULAR | Status: DC | PRN
  Administered 2022-01-28: 200 ug via INTRAVENOUS
  Administered 2022-02-01 – 2022-02-02 (×2): 100 ug via INTRAVENOUS
  Administered 2022-02-05: 50 ug via INTRAVENOUS
  Filled 2022-01-21: qty 1
  Filled 2022-01-21 (×2): qty 2

## 2022-01-21 MED ORDER — SODIUM CHLORIDE 0.9 % IV SOLN
250.0000 mL | INTRAVENOUS | Status: DC
Start: 2022-01-21 — End: 2022-02-08
  Administered 2022-01-21 – 2022-02-07 (×4): 250 mL via INTRAVENOUS

## 2022-01-21 MED ORDER — FUROSEMIDE 10 MG/ML IJ SOLN
40.0000 mg | Freq: Once | INTRAMUSCULAR | Status: AC
  Administered 2022-01-21: 40 mg via INTRAVENOUS
  Filled 2022-01-21: qty 4

## 2022-01-21 MED ORDER — VASOPRESSIN 20 UNITS/100 ML INFUSION FOR SHOCK
0.0000 [IU]/min | INTRAVENOUS | Status: DC
  Filled 2022-01-21: qty 100

## 2022-01-21 MED ORDER — POLYETHYLENE GLYCOL 3350 17 G PO PACK: 17.0000 g | PACK | Freq: Every day | ORAL | Status: DC | PRN

## 2022-01-21 MED ORDER — ACETAMINOPHEN 325 MG PO TABS
650.0000 mg | ORAL_TABLET | Freq: Four times a day (QID) | ORAL | Status: DC | PRN
  Administered 2022-01-28 (×2): 650 mg via ORAL
  Filled 2022-01-21 (×2): qty 2

## 2022-01-21 MED ORDER — INSULIN ASPART 100 UNIT/ML IJ SOLN
0.0000 [IU] | INTRAMUSCULAR | Status: DC
  Administered 2022-01-21: 20:00:00 3 [IU] via SUBCUTANEOUS
  Administered 2022-01-21: 2 [IU] via SUBCUTANEOUS
  Administered 2022-01-21: 17:00:00 3 [IU] via SUBCUTANEOUS
  Administered 2022-01-22 (×2): 2 [IU] via SUBCUTANEOUS
  Administered 2022-01-22 (×2): 3 [IU] via SUBCUTANEOUS
  Administered 2022-01-23 (×6): 2 [IU] via SUBCUTANEOUS
  Administered 2022-01-23 – 2022-01-24 (×2): 3 [IU] via SUBCUTANEOUS
  Administered 2022-01-24 (×3): 2 [IU] via SUBCUTANEOUS
  Administered 2022-01-24: 3 [IU] via SUBCUTANEOUS
  Administered 2022-01-25 – 2022-01-27 (×10): 2 [IU] via SUBCUTANEOUS
  Administered 2022-01-27: 09:00:00 3 [IU] via SUBCUTANEOUS
  Administered 2022-01-27 – 2022-01-28 (×3): 2 [IU] via SUBCUTANEOUS
  Administered 2022-01-28: 5 [IU] via SUBCUTANEOUS
  Administered 2022-01-28: 3 [IU] via SUBCUTANEOUS
  Administered 2022-01-28 – 2022-01-29 (×4): 2 [IU] via SUBCUTANEOUS
  Administered 2022-01-29: 3 [IU] via SUBCUTANEOUS
  Administered 2022-01-29 – 2022-01-30 (×8): 2 [IU] via SUBCUTANEOUS
  Administered 2022-01-30: 21:00:00 3 [IU] via SUBCUTANEOUS
  Administered 2022-01-31 – 2022-02-02 (×10): 2 [IU] via SUBCUTANEOUS
  Administered 2022-02-02 (×2): 3 [IU] via SUBCUTANEOUS
  Administered 2022-02-03: 12:00:00 2 [IU] via SUBCUTANEOUS
  Administered 2022-02-03: 16:00:00 3 [IU] via SUBCUTANEOUS
  Administered 2022-02-04: 2 [IU] via SUBCUTANEOUS
  Administered 2022-02-04: 12:00:00 3 [IU] via SUBCUTANEOUS
  Administered 2022-02-04: 08:00:00 2 [IU] via SUBCUTANEOUS
  Administered 2022-02-04: 16:00:00 3 [IU] via SUBCUTANEOUS
  Administered 2022-02-05 – 2022-02-07 (×8): 2 [IU] via SUBCUTANEOUS
  Filled 2022-01-21 (×75): qty 1

## 2022-01-21 MED ORDER — ORAL CARE MOUTH RINSE
15.0000 mL | OROMUCOSAL | Status: DC
  Administered 2022-01-21 – 2022-02-05 (×148): 15 mL via OROMUCOSAL

## 2022-01-21 MED ORDER — IPRATROPIUM-ALBUTEROL 0.5-2.5 (3) MG/3ML IN SOLN
3.0000 mL | RESPIRATORY_TRACT | Status: DC
  Administered 2022-01-21 – 2022-01-28 (×43): 3 mL via RESPIRATORY_TRACT
  Filled 2022-01-21 (×44): qty 3

## 2022-01-21 MED ORDER — AZITHROMYCIN 250 MG PO TABS: 250.0000 mg | ORAL_TABLET | Freq: Every day | ORAL | Status: DC

## 2022-01-21 MED ORDER — SODIUM CHLORIDE 0.9 % IV BOLUS
1000.0000 mL | Freq: Once | INTRAVENOUS | Status: AC
Start: 2022-01-21 — End: 2022-01-21
  Administered 2022-01-21: 1000 mL via INTRAVENOUS

## 2022-01-21 MED ORDER — KETAMINE HCL 50 MG/5ML IJ SOSY
200.0000 mg | PREFILLED_SYRINGE | Freq: Once | INTRAMUSCULAR | Status: AC
  Administered 2022-01-21: 200 mg via INTRAVENOUS

## 2022-01-21 MED ORDER — PHENYLEPHRINE HCL-NACL 20-0.9 MG/250ML-% IV SOLN
25.0000 ug/min | INTRAVENOUS | Status: DC
  Administered 2022-01-21: 50 ug/min via INTRAVENOUS
  Filled 2022-01-21: qty 250

## 2022-01-21 MED ORDER — ALBUTEROL SULFATE (2.5 MG/3ML) 0.083% IN NEBU
2.5000 mg | INHALATION_SOLUTION | RESPIRATORY_TRACT | Status: DC | PRN
Start: 2022-01-21 — End: 2022-02-13
  Administered 2022-01-29 – 2022-02-11 (×5): 2.5 mg via RESPIRATORY_TRACT
  Filled 2022-01-21 (×5): qty 3

## 2022-01-21 MED ORDER — AMIODARONE HCL IN DEXTROSE 360-4.14 MG/200ML-% IV SOLN
30.0000 mg/h | INTRAVENOUS | Status: DC
  Administered 2022-01-22 – 2022-01-25 (×7): 30 mg/h via INTRAVENOUS
  Filled 2022-01-21 (×7): qty 200

## 2022-01-21 MED ORDER — FENTANYL CITRATE PF 50 MCG/ML IJ SOSY
50.0000 ug | PREFILLED_SYRINGE | INTRAMUSCULAR | Status: DC | PRN
  Administered 2022-01-21 – 2022-02-02 (×2): 50 ug via INTRAVENOUS
  Filled 2022-01-21: qty 1

## 2022-01-21 MED ORDER — ONDANSETRON HCL 4 MG/2ML IJ SOLN: 4.0000 mg | Freq: Three times a day (TID) | INTRAMUSCULAR | Status: DC | PRN

## 2022-01-21 MED ORDER — DEXTROSE 5 % IV SOLN
0.7000 mg/kg/h | INTRAVENOUS | Status: DC
  Administered 2022-01-21 – 2022-01-25 (×13): 0.7 mg/kg/h via INTRAVENOUS
  Filled 2022-01-21 (×19): qty 20

## 2022-01-21 MED ORDER — VECURONIUM BROMIDE 10 MG IV SOLR
INTRAVENOUS | Status: AC
  Administered 2022-01-21: 10 mg via INTRAVENOUS
  Filled 2022-01-21: qty 10

## 2022-01-21 MED ORDER — PHENYLEPHRINE CONCENTRATED 100MG/250ML (0.4 MG/ML) INFUSION SIMPLE
0.0000 ug/min | INTRAVENOUS | Status: DC
  Administered 2022-01-21: 80 ug/min via INTRAVENOUS
  Administered 2022-01-22: 220 ug/min via INTRAVENOUS
  Administered 2022-01-22: 160 ug/min via INTRAVENOUS
  Administered 2022-01-23: 80 ug/min via INTRAVENOUS
  Filled 2022-01-21 (×5): qty 250

## 2022-01-21 MED ORDER — ROCURONIUM BROMIDE 10 MG/ML (PF) SYRINGE
PREFILLED_SYRINGE | INTRAVENOUS | Status: AC
  Administered 2022-01-21: 100 mg via INTRAVENOUS
  Filled 2022-01-21: qty 10

## 2022-01-21 NOTE — ED Provider Notes (Signed)
Emergency department handoff note  Care of this patient was signed out to me at the end of the previous provider shift.  All pertinent patient information was conveyed and all questions were answered.  Patient pending admission to the internal medicine service.  Prior to his admission, patient's respiratory status significantly worsened including patient tripoding and breathing at significantly lower tidal volumes than would be expected for a man of his size.  Attempts were made to repeat DuoNeb treatments as well as increase the pressure through his BiPAP however as patient continued to deteriorate, the decision was made for intubation.  Please see intubation note for further details.  I spoke to Dr. Patsey Berthold in critical care who agrees to accept this patient onto the ICU service for further evaluation and management.  Procedure Name: Intubation Date/Time: 01/21/2022 10:07 AM Performed by: Naaman Plummer, MD Pre-anesthesia Checklist: Patient identified, Patient being monitored, Emergency Drugs available, Timeout performed and Suction available Oxygen Delivery Method: Non-rebreather mask Preoxygenation: Pre-oxygenation with 100% oxygen Induction Type: Rapid sequence Ventilation: Mask ventilation without difficulty Laryngoscope Size: Glidescope Grade View: Grade I Tube size: 8.0 mm Number of attempts: 1 Airway Equipment and Method: Video-laryngoscopy Placement Confirmation: ETT inserted through vocal cords under direct vision, CO2 detector and Breath sounds checked- equal and bilateral Secured at: 24 cm Tube secured with: ETT holder Dental Injury: Teeth and Oropharynx as per pre-operative assessment    CRITICAL CARE Performed by: Naaman Plummer   Total critical care time: 29 minutes  Critical care time was exclusive of separately billable procedures and treating other patients.  Critical care was necessary to treat or prevent imminent or life-threatening deterioration.  Critical care  was time spent personally by me on the following activities: development of treatment plan with patient and/or surrogate as well as nursing, discussions with consultants, evaluation of patient's response to treatment, examination of patient, obtaining history from patient or surrogate, ordering and performing treatments and interventions, ordering and review of laboratory studies, ordering and review of radiographic studies, pulse oximetry and re-evaluation of patient's condition.     Naaman Plummer, MD 01/21/22 [phone]

## 2022-01-21 NOTE — Progress Notes (Signed)
Spoke with RN. Stated the MD was placing a CVC at this time, so USGPIV not needed.

## 2022-01-21 NOTE — ED Notes (Signed)
RT and EDP preparing to intubate pt.

## 2022-01-21 NOTE — ED Notes (Signed)
See respiratory assessment. RT is at bedside and attending provider notified by primary nurse that pt is still experiencing air hunger and sitting in tripod position with bipap in place. Pt has profound inspiratory and expiratory wheezing in all lung fields.

## 2022-01-21 NOTE — Procedures (Signed)
Central Venous Catheter Insertion Procedure Note  Kenneth Benson  [id number]  [birth year]  Date:01/21/22  Time:12:37 PM   Provider Performing:Kooper Godshall Earnest Conroy   Procedure: Insertion of Non-tunneled Central Venous [phone]) with US guidance ([id number])   Indication(s) Medication administration  Consent Emergent unable to get in contact with next of kin   Anesthesia Topical only with 1% lidocaine   Timeout Verified patient identification, verified procedure, site/side was marked, verified correct patient position, special equipment/implants available, medications/allergies/relevant history reviewed, required imaging and test results available.  Sterile Technique Maximal sterile technique including full sterile barrier drape, hand hygiene, sterile gown, sterile gloves, mask, hair covering, sterile ultrasound probe cover (if used).  Procedure Description Area of catheter insertion was cleaned with chlorhexidine and draped in sterile fashion.  With real-time ultrasound guidance a central venous catheter was placed into the left internal jugular vein. Nonpulsatile blood flow and easy flushing noted in all ports.  The catheter was sutured in place and sterile dressing applied.  Complications/Tolerance None; patient tolerated the procedure well. Chest X-ray is ordered to verify placement for internal jugular or subclavian cannulation.   Chest x-ray is not ordered for femoral cannulation.  EBL Minimal  Specimen(s) None  [name], AGNP  Pulmonary/Critical Care Pager [phone] (please enter 7 digits) PCCM Consult Pager [phone] (please enter 7 digits)

## 2022-01-21 NOTE — ED Provider Notes (Signed)
[facility] Provider Note    Event Date/Time   First MD Initiated Contact with Patient 01/21/22 [phone]     (approximate)   History   Shortness of Breath   HPI  Kenneth Benson is a 61 y.o. male with a history of COPD and smoking who presents for evaluation of shortness of breath.  Patient describes cough and congestion for the last several days.  Has been having progressively worsening shortness of breath and wheezing.  Has been using his inhaler at home.  This morning patient's shortness of breath became severe.  When EMS arrived patient was satting in the 8s.  Patient was given 2 DuoNeb's and 125 mg of Solu-Medrol in route.  Patient arrives in moderate respiratory distress, diffuse wheezing bilaterally.  Patient denies fever.  He has had chest tightness but no chest pain.  No personal or family history of PE or DVT, no recent travel immobilization, no leg pain or swelling, no hemoptysis or exogenous hormones.     Past Medical History:  Diagnosis Date   Asthma     History reviewed. No pertinent surgical history.   Physical Exam   Triage Vital Signs: ED Triage Vitals  Enc Vitals Group     BP 01/21/22 0537 (!) 161/95     Pulse --      Resp 01/21/22 0537 (!) 24     Temp --      Temp src --      SpO2 01/21/22 0537 93 %     Weight 01/21/22 0538 225 lb (102.1 kg)     Height 01/21/22 0538 6' (1.829 m)     Head Circumference --      Peak Flow --      Pain Score 01/21/22 0538 0     Pain Loc --      Pain Edu? --      Excl. in [place]? --     Most recent vital signs: Vitals:   01/21/22 0537 01/21/22 0600  BP: (!) 161/95 (!) 129/100  Pulse:  86  Resp: (!) 24 (!) 28  SpO2: 93% 96%     Constitutional: Alert and oriented.  Moderate respiratory distress  HEENT:      Head: Normocephalic and atraumatic.         Eyes: Conjunctivae are normal. Sclera is non-icteric.       Mouth/Throat: Mucous membranes are moist.       Neck: Supple with no signs of  meningismus. Cardiovascular: Tachycardic rate with irregularly irregular rhythm  respiratory: Tachypneic, hypoxic, diffuse wheezing bilaterally, accessory muscles of respiration use Gastrointestinal: Soft, non tender, and non distended with positive bowel sounds. No rebound or guarding. Genitourinary: No CVA tenderness. Musculoskeletal:  No edema, cyanosis, or erythema of extremities. Neurologic: Normal speech and language. Face is symmetric. Moving all extremities. No gross focal neurologic deficits are appreciated. Skin: Skin is warm, dry and intact. No rash noted. Psychiatric: Mood and affect are normal. Speech and behavior are normal.  ED Results / Procedures / Treatments   Labs (all labs ordered are listed, but only abnormal results are displayed) Labs Reviewed  BLOOD GAS, VENOUS - Abnormal; Notable for the following components:      Result Value   pH, Ven 7.17 (*)    pCO2, Ven 99 (*)    pO2, Ven 64 (*)    Bicarbonate 36.1 (*)    Acid-Base Excess 4.1 (*)    All other components within normal limits  BASIC METABOLIC  PANEL - Abnormal; Notable for the following components:   CO2 34 (*)    Glucose, Bld 129 (*)    Calcium 8.4 (*)    All other components within normal limits  RESP PANEL BY RT-PCR (FLU A&B, COVID) ARPGX2  CBC WITH DIFFERENTIAL/PLATELET  BRAIN NATRIURETIC PEPTIDE  BLOOD GAS, VENOUS  TROPONIN I (HIGH SENSITIVITY)     EKG  ED ECG REPORT I, Rudene Re, the attending physician, personally viewed and interpreted this ECG.  Sinus tachycardia, rate 140, no STE or depression   RADIOLOGY I, Rudene Re, attending MD, have personally viewed and interpreted the images obtained during this visit as below:  CXR no edema or infiltrate   ___________________________________________________ Interpretation by Radiologist:  DG Chest Portable 1 View  Result Date: 01/21/2022 CLINICAL DATA:  61 year old male with history of shortness of breath. EXAM:  PORTABLE CHEST 1 VIEW COMPARISON:  Chest x-ray 10/11/2015. FINDINGS: Lung volumes are normal. No consolidative airspace disease. No pleural effusions. No pneumothorax. No pulmonary nodule or mass noted. Pulmonary vasculature and the cardiomediastinal silhouette are within normal limits. IMPRESSION: No radiographic evidence of acute cardiopulmonary disease. Electronically Signed   By: Vinnie Langton M.D.   On: 01/21/2022 06:11      PROCEDURES:  Critical Care performed: Yes, see critical care procedure note(s)  .Critical Care Performed by: Rudene Re, MD Authorized by: Rudene Re, MD   Critical care provider statement:    Critical care time (minutes):  40   Critical care was necessary to treat or prevent imminent or life-threatening deterioration of the following conditions:  Respiratory failure, cardiac failure, circulatory failure, CNS failure or compromise and shock   Critical care was time spent personally by me on the following activities:  Development of treatment plan with patient or surrogate, discussions with consultants, evaluation of patient's response to treatment, examination of patient, ordering and review of laboratory studies, ordering and review of radiographic studies, ordering and performing treatments and interventions, pulse oximetry, re-evaluation of patient's condition and review of old charts   I assumed direction of critical care for this patient from another provider in my specialty: no     Care discussed with: admitting provider      IMPRESSION / MDM / [place] / ED COURSE  I reviewed the triage vital signs and the nursing notes.  61 y.o. male with a history of COPD and smoking who presents for evaluation of shortness of breath.  Patient arrives in moderate respiratory distress, hypoxic, tachypneic, tripoding, using accessory muscles of respiration with diffuse wheezing bilaterally.  No asymmetric leg swelling.  Patient was placed on BiPAP  immediately on arrival.  Ddx: COPD exacerbation versus pneumonia versus COVID or flu versus new CHF versus myocarditis versus pericarditis versus pericardial effusion versus PE    Plan: BiPAP, cardiac telemetry for close monitoring of respiratory status, 3 duo nebs, IV magnesium.  Patient already received Solu-Medrol.  EKG, troponin x2, BMP, BNP, VBG, CBC, chest x-ray, COVID and flu swab   MEDICATIONS GIVEN IN ED: Medications  ipratropium-albuterol (DUONEB) 0.5-2.5 (3) MG/3ML nebulizer solution 3 mL (3 mLs Nebulization Given 01/21/22 0547)  ipratropium-albuterol (DUONEB) 0.5-2.5 (3) MG/3ML nebulizer solution 3 mL (3 mLs Nebulization Given 01/21/22 0547)  ipratropium-albuterol (DUONEB) 0.5-2.5 (3) MG/3ML nebulizer solution 3 mL (3 mLs Nebulization Given 01/21/22 0547)  magnesium sulfate IVPB 2 g 50 mL (2 g Intravenous New Bag/Given 01/21/22 0548)     ED COURSE: Chest x-ray with no pneumonia or edema.  No leukocytosis.  VBG  showing PCO2 of 99 with a pH of 7.17.  Plan to repeat after an hour on BiPAP.  Patient respiratory status is markedly improved after 15 minutes of BiPAP.  He has normal mentation.  We will continue to monitor closely his respiratory status and mental status.  Basic metabolic panel with no significant electrolyte derangements or dehydration.  Mildly elevated bicarb which is expected with a PCO2 of 99.  First troponin is 9.  EKG with no ischemia. BNPnormal. Covid and flu neg.  Hospitalist was consulted and after discussion has accepted patient to their service   Consults: Hospitalist   EMR reviewed including last visit to the ED which was in 2014 for COPD exacerbation.  No other available records    FINAL CLINICAL IMPRESSION(S) / ED DIAGNOSES   Final diagnoses:  COPD exacerbation ([place])  Acute respiratory failure with hypoxia and hypercapnia ([place])     Rx / DC Orders   ED Discharge Orders     None        Note:  This document was prepared using Dragon voice  recognition software and may include unintentional dictation errors.   Please note:  Patient was evaluated in Emergency Department today for the symptoms described in the history of present illness. Patient was evaluated in the context of the global COVID-19 pandemic, which necessitated consideration that the patient might be at risk for infection with the SARS-CoV-2 virus that causes COVID-19. Institutional protocols and algorithms that pertain to the evaluation of patients at risk for COVID-19 are in a state of rapid change based on information released by regulatory bodies including the CDC and federal and state organizations. These policies and algorithms were followed during the patient's care in the ED.  Some ED evaluations and interventions may be delayed as a result of limited staffing during the pandemic.       Alfred Levins, [place], MD 01/21/22 [phone]

## 2022-01-21 NOTE — Progress Notes (Signed)
*  PRELIMINARY RESULTS* Echocardiogram 2D Echocardiogram has been performed.  Cristela Blue 01/21/2022, 2:01 PM

## 2022-01-21 NOTE — Progress Notes (Signed)
Initial Nutrition Assessment  DOCUMENTATION CODES:   Obesity unspecified  INTERVENTION:   Once appropriate for tube feeds, recommend:  Vital HP @35ml /hr + ProSource TF 33ml QID via tube   Propofol: 18.38 ml/hr- provides 485kcal/day   Free water flushes 54ml q4 hours to maintain tube patency   Regimen provides 1645kcal/day, 162g/day protein and 85ml/day of free water   Liquid MVI daily via tube   NUTRITION DIAGNOSIS:   Inadequate oral intake related to inability to eat (pt sedated and ventilated) as evidenced by NPO status.  GOAL:   Provide needs based on ASPEN/SCCM guidelines  MONITOR:   Vent status, Labs, Weight trends, Skin, I & O's  REASON FOR ASSESSMENT:   Ventilator    ASSESSMENT:   61 y/o male with h/o asthma, COPD and currently smoker who is admitted with severe COPD exacerbation.  Pt sedated and ventilated. OGT in place. Per MD, will plan to hold on tube feeds for today. There is no documented weight history in chart to determine if any significant recent weight changes.   Medications reviewed and include: azithromycin, colace, lovenox, pepcid, solu-medrol, nicotine, miralax, neo-synephrine, propofol   Labs reviewed: Na 143 wnl, K 4.1 wnl, Mg 2.6(H) Cbgs- 170, 175 x 24 hrs  Patient is currently intubated on ventilator support MV: 9.6 L/min Temp (24hrs), Avg:97.4 F (36.3 C), Min:96.8 F (36 C), Max:98.6 F (37 C)  Propofol: 18.38 ml/hr- provides 485kcal/day   MAP- >81mmHg   UOP- 77m   NUTRITION - FOCUSED PHYSICAL EXAM:  Flowsheet Row Most Recent Value  Orbital Region Mild depletion  Upper Arm Region Mild depletion  Thoracic and Lumbar Region No depletion  Buccal Region No depletion  Temple Region Mild depletion  Clavicle Bone Region No depletion  Clavicle and Acromion Bone Region No depletion  Scapular Bone Region No depletion  Dorsal Hand No depletion  Patellar Region No depletion  Anterior Thigh Region No depletion  Posterior  Calf Region No depletion  Edema (RD Assessment) None  Hair Reviewed  Eyes Reviewed  Mouth Reviewed  Skin Reviewed  Nails Reviewed   Diet Order:   Diet Order             Diet NPO time specified  Diet effective now                  EDUCATION NEEDS:   No education needs have been identified at this time  Skin:  Skin Assessment: Reviewed RN Assessment  Last BM:  pta  Height:   Ht Readings from Last 1 Encounters:  01/21/22 6' (1.829 m)    Weight:   Wt Readings from Last 1 Encounters:  01/21/22 107.2 kg    Ideal Body Weight:  80.9 kg  BMI:  Body mass index is 32.05 kg/m.  Estimated Nutritional Needs:   Kcal:  1179-1500kcal/day  Protein:  >160g/day  Fluid:  2.4-2.7L/day  01/23/22 MS, RD, LDN Please refer to [facility] for RD and/or RD on-call/weekend/after hours pager

## 2022-01-21 NOTE — Consult Note (Signed)
PHARMACY CONSULT NOTE  Pharmacy Consult for Electrolyte Monitoring and Replacement   Recent Labs: Potassium (mmol/L)  Date Value  01/21/2022 4.1   Calcium (mg/dL)  Date Value  16/60/6301 8.4 (L)   Sodium (mmol/L)  Date Value  01/21/2022 143   Assessment: Patient is a 61 y/o M with medical history including COPD / asthma and tobacco abuse who presented to the ED 2/24 with acute respiratory failure secondary to asthma / COPD exacerbation. Patient ultimately required intubation. Disposition is the ICU. Pharmacy consulted to assist with electrolyte monitoring and replacement as indicated.  Goal of Therapy:  Electrolytes within normal limits  Plan:  --No electrolyte replacement indicated at this time --Follow-up electrolytes with AM labs tomorrow  Tressie Ellis 01/21/2022 9:06 AM

## 2022-01-21 NOTE — H&P (Signed)
NAME:  Kenneth Benson, MRN:  [id number], DOB:  11-Sep-1961, LOS: 0 ADMISSION DATE:  01/21/2022, CONSULTATION DATE: 01/21/2022 REFERRING MD: Dr. Cheri Fowler, CHIEF COMPLAINT: Shortness of Breath    History of Present Illness:  This is a 61 yo male who presented to [facility] ER via EMS on 02/24 with c/o shortness of breath, cough, wheezing, and congestion onset of symptoms several days prior to presentation. He reported using his inhaler without relief of symptoms.  EMS reported pt severely hypoxic with O2 sats in the 60's.  He received duoneb's x2 and 125 mg of iv solumedrol en route to the ER.  All hx obtained from chart review pt currently sedated and mechanically intubated with no family at bedside.   ED Course Upon arrival to the ER pt noted to be in moderate respiratory distress with diffuse wheezes per ED provider notes.  CXR, COVID-19/Influenza A&B, and pct negative.  VBG revealed pH 7.17/pCO2 99/bicarb 36.1.  EKG revealed sinus tachycardia hr 140 without ST elevation/depression and troponin 9.  He received duoneb x3 and 2 g of iv magnesium.  He was subsequently placed on Bipap with initial improvement of respiratory status.  However, respiratory status rapidly declined and pt required mechanical intubation.  He received an additional 2 g of iv magnesium and duoneb post intubation.  PCCM team contacted for ICU admission.  Pertinent  Medical History  COPD Current Smoker  Asthma   Significant Hospital Events: Including procedures, antibiotic start and stop dates in addition to other pertinent events   02/24: Pt admitted to ICU with acute on chronic hypercapnic hypoxic respiratory failure secondary to asthma and COPD exacerbation requiring mechanical intubation   Interim History / Subjective:  Pt mechanically intubated with severe inspiratory and expiratory wheezes with high peak pressures in the 70's.  Objective   Blood pressure (!) 142/95, pulse (!) 116, temperature 98.1 F (36.7 C), temperature  source Oral, resp. rate 20, height 6' (1.829 m), weight 102.1 kg, SpO2 93 %.    Vent Mode: AC FiO2 (%):  [60 %] 60 % Set Rate:  [20 bmp] 20 bmp Vt Set:  [500 mL] 500 mL PEEP:  [8 cmH20] 8 cmH20   Intake/Output Summary (Last 24 hours) at 01/21/2022 1028 Last data filed at 01/21/2022 2800 Gross per 24 hour  Intake 850 ml  Output --  Net 850 ml   Filed Weights   01/21/22 0538  Weight: 102.1 kg    Examination: General: acutely ill appearing male, sedated and mechanically intubated  HENT: supple, JVD present  Lungs: diffuse expiratory and inspiratory wheezes throughout, even, non labored  Cardiovascular: ventricular trigeminy with frequent PVC's, no R/G, 2+ radial/2+ distal pulses, no edema  Abdomen: +BS x4, obese, soft, non tender, non distended  Extremities: normal bulk and tone, no edema  Neuro: sedated, not following commands, PERRL GU: indwelling foley draining clear yellow urine   Resolved Hospital Problem list   N/A  Assessment & Plan:  Acute on chronic hypoxic hypercapnic respiratory failure secondary to asthma and COPD exacerbation  Mechanical intubation Current smoker  - Full vent support for now: vent settings reviewed and established  - Pt will need higher TV and low respiratory rate for now until paralyzed  - SBT once all parameters met  - Will start aminophylline gtt: monitor theophylline levels  - IV steroids: wean as tolerated  - Continue scheduled and prn bronchodilator therapy  - Respiratory panel by PCR pending  - Follow respiratory culture - Trend WBC and monitor  fever curve  - Continue azithromycin: stop date 01/26/22 - PAD protocol: maintain RASS goal -4 to -5 for now  - Nimbex, propofol, and fentanyl gtts to maintain RASS goal  - WUA once pt stabilized  - Continue bowel regimen  - Once extubated will need smoking cessation counseling   Ventricular trigeminy with frequent PVC's - Continuous telemetry monitoring  - Replace electrolytes as indicated   - Echo pending  - Trend troponin's   Best Practice (right click and "Reselect all SmartList Selections" daily)   Diet/type: NPO DVT prophylaxis: LMWH GI prophylaxis: H2B Lines: N/A Foley:  Yes, and it is still needed Code Status:  full code Last date of multidisciplinary goals of care discussion [01/21/2022]  Updated pts significant other [name] Hostetter via telephone regarding plan of care and all questions were answered.  Labs   CBC: Recent Labs  Lab 01/21/22 0544  WBC 8.4  NEUTROABS 6.4  HGB 15.9  HCT 50.9  MCV 96.8  PLT 701    Basic Metabolic Panel: Recent Labs  Lab 01/21/22 0544  NA 143  K 4.1  CL 102  CO2 34*  GLUCOSE 129*  BUN 16  CREATININE 0.62  CALCIUM 8.4*   GFR: Estimated Creatinine Clearance: 122.9 mL/min (by C-G formula based on SCr of 0.62 mg/dL). Recent Labs  Lab 01/21/22 0544 01/21/22 0854  PROCALCITON  --  <0.10  WBC 8.4  --   LATICACIDVEN  --  1.0    Liver Function Tests: No results for input(s): AST, ALT, ALKPHOS, BILITOT, PROT, ALBUMIN in the last 168 hours. No results for input(s): LIPASE, AMYLASE in the last 168 hours. No results for input(s): AMMONIA in the last 168 hours.  ABG    Component Value Date/Time   HCO3 37.4 (H) 01/21/2022 0708   O2SAT 91.8 01/21/2022 0708     Coagulation Profile: No results for input(s): INR, PROTIME in the last 168 hours.  Cardiac Enzymes: No results for input(s): CKTOTAL, CKMB, CKMBINDEX, TROPONINI in the last 168 hours.  HbA1C: No results found for: HGBA1C  CBG: Recent Labs  Lab 01/21/22 1019  GLUCAP 175*    Review of Systems:   Unable to assess pt mechanically intubated   Past Medical History:  He,  has a past medical history of Asthma and Tobacco abuse.   Surgical History:  History reviewed. No pertinent surgical history.   Social History:   reports that he has been smoking. He does not have any smokeless tobacco history on file. He reports that he does not drink alcohol  and does not use drugs.   Family History:  His family history is not on file.   Allergies No Known Allergies   Home Medications  Prior to Admission medications   Medication Sig Start Date End Date Taking? Authorizing Provider  albuterol (PROVENTIL HFA;VENTOLIN HFA) 108 (90 BASE) MCG/ACT inhaler Inhale 2 puffs into the lungs every 6 (six) hours as needed for wheezing. Patient not taking: Reported on 01/21/2022    [provider]  ibuprofen (ADVIL,MOTRIN) 600 MG tablet Take 1 tablet (600 mg total) by mouth every 8 (eight) hours as needed. Patient not taking: Reported on 01/21/2022 10/01/15   Lisa Roca, MD     Critical care time: 73 minutes      Donell Beers, [place] Pager [phone] (please enter 7 digits) PCCM Consult Pager [phone] (please enter 7 digits)

## 2022-01-21 NOTE — Procedures (Signed)
Arterial Catheter Insertion Procedure Note  Kenneth Benson  [id number]  [birth year]  Date:01/21/22  Time:4:37 PM    Provider Performing: Ezequiel Essex    Procedure: Insertion of Arterial Line (46568) with US guidance (12751)   Indication(s) Blood pressure monitoring and/or need for frequent ABGs  Consent Unable to obtain consent due to emergent nature of procedure.  Anesthesia Pt mechanically intubated on propofol, fentanyl, and nimbex gtts    Time Out Verified patient identification, verified procedure, site/side was marked, verified correct patient position, special equipment/implants available, medications/allergies/relevant history reviewed, required imaging and test results available.   Sterile Technique Maximal sterile technique including full sterile barrier drape, hand hygiene, sterile gown, sterile gloves, mask, hair covering, sterile ultrasound probe cover (if used).   Procedure Description Area of catheter insertion was cleaned with chlorhexidine and draped in sterile fashion. With real-time ultrasound guidance an arterial catheter was placed into the right femoral artery.  Appropriate arterial tracings confirmed on monitor.     Complications/Tolerance None; patient tolerated the procedure well.   EBL Minimal   Specimen(s) None  Initially attempted to place left radial arterial line than right radial, however the guidewire would not advance.    [name], AGNP  Pulmonary/Critical Care Pager [phone] (please enter 7 digits) PCCM Consult Pager [phone] (please enter 7 digits)

## 2022-01-21 NOTE — Consult Note (Cosign Needed)
[place] NOTE       Patient ID: Kenneth Benson MRN: [id number] DOB/AGE: Feb 16, 1961 61 y.o.  Admit date: 01/21/2022 Referring Physician Dr. Valora Piccolo Primary Physician  Primary Cardiologist  Reason for Consultation EKG changes  HPI: The patient is a 61 year old male with a past medical history notable for asthma, COPD, and current tobacco use who presented to [facility] ED in the early morning hours complaining of shortness of breath with worsening cough and congestion.  Per EMS reporting the patient's O2 sats were in the 60s and he was given 2 DuoNebs and Solu-Medrol on route.  He presented to the ED in moderate respiratory distress with diffuse wheezing and had chest tightness without chest pain.  He was given DuoNebs x3 and 2 g of IV mag and was subsequently placed on BiPAP and eventually required intubation due to worsening respiratory status.  Cardiology is consulted because his EKG changes and now show ventricular bigeminy.  Patient is currently intubated and paralyzed with no family at bedside, history above obtained via chart review.  His initial EKG at 0538 showed sinus tach with rate of 140 and premature atrial complexes, repeat EKG at 0730 showed sinus tach with somewhat improved rate at 103 with PVCs frequently, most recent EKG at 08 54 showed sinus rhythm with ventricular bigeminy rate 98, review of telemetry in the ICU showed sinus rhythm and rate 80 with pairs and triplets of PVCs.   It does not appear that he has ever seen a cardiologist or has much outpatient healthcare in general prior to this hospital other than a few ED visits (most recently in 2016) per review of care everywhere  Labs are notable for respiratory acidosis, potassium 4.1, creatinine 0.62, EGFR greater than 60.  Mag 2.6, BNP 37.5, troponin flat 9 - 8 - 7, procalcitonin less than 0.10 H&H 15.9, hematocrit 50.9  CT angio notable for aortic atherosclerosis without signs of pulmonary embolism  to the central segmental level, suspected bronchomalacia and potential early aspiration changes without lobar consolidation, G-tube coiled in the stomach.   Review of systems complete and found to be negative unless listed above    Past Medical History:  Diagnosis Date   Asthma    Tobacco abuse     History reviewed. No pertinent surgical history.  Medications Prior to Admission  Medication Sig Dispense Refill Last Dose   albuterol (PROVENTIL HFA;VENTOLIN HFA) 108 (90 BASE) MCG/ACT inhaler Inhale 2 puffs into the lungs every 6 (six) hours as needed for wheezing. (Patient not taking: Reported on 01/21/2022)   Not Taking   ibuprofen (ADVIL,MOTRIN) 600 MG tablet Take 1 tablet (600 mg total) by mouth every 8 (eight) hours as needed. (Patient not taking: Reported on 01/21/2022) 20 tablet 0 Not Taking   Social History   Socioeconomic History   Marital status: Single    Spouse name: Not on file   Number of children: Not on file   Years of education: Not on file   Highest education level: Not on file  Occupational History   Not on file  Tobacco Use   Smoking status: Every Day   Smokeless tobacco: Not on file  Substance and Sexual Activity   Alcohol use: No   Drug use: No   Sexual activity: Not on file  Other Topics Concern   Not on file  Social History Narrative   Not on file   Social Determinants of Health   Financial Resource Strain: Not on file  Food  Insecurity: Not on file  Transportation Needs: Not on file  Physical Activity: Not on file  Stress: Not on file  Social Connections: Not on file  Intimate Partner Violence: Not on file    History reviewed. No pertinent family history.    Review of systems complete and found to be negative unless listed above    PHYSICAL EXAM General: Acutely ill-appearing middle-aged Caucasian male, intubated in the ICU  HEENT:  Normocephalic and atraumatic. Neck:  No JVD.  Lungs: Intubated.  Extremely poor air movement, diffuse  expiratory wheezes Heart: Heart rate regular with frequent extra beats. Normal S1 and S2 without gallops or murmurs. Radial & DP pulses 2+ bilaterally. Abdomen: Soft, obese Msk: Normal tone for age. Extremities: Warm and well perfused. No clubbing, cyanosis.  Trace lower extremity edema bilaterally Neuro: Sedated. Psych: Unable to assess due to sedation  Labs:   Lab Results  Component Value Date   WBC 8.4 01/21/2022   HGB 15.9 01/21/2022   HCT 50.9 01/21/2022   MCV 96.8 01/21/2022   PLT 191 01/21/2022    Recent Labs  Lab 01/21/22 0544  NA 143  K 4.1  CL 102  CO2 34*  BUN 16  CREATININE 0.62  CALCIUM 8.4*  GLUCOSE 129*   No results found for: CKTOTAL, CKMB, CKMBINDEX, TROPONINI No results found for: CHOL No results found for: HDL No results found for: LDLCALC No results found for: TRIG No results found for: CHOLHDL No results found for: LDLDIRECT    Radiology: DG Chest 1 View  Result Date: 01/21/2022 CLINICAL DATA:  Intubation EXAM: CHEST  1 VIEW COMPARISON:  None. FINDINGS: Endotracheal tube 4.7 cm from carina in good position. NG tube extends the stomach. Lungs are clear.  Normal cardiac silhouette.  No pneumothorax. IMPRESSION: 1. Support apparatus in good position. 2. No acute cardiopulmonary findings. Electronically Signed   By: Suzy Bouchard M.D.   On: 01/21/2022 09:19   DG Abdomen 1 View  Result Date: 01/21/2022 CLINICAL DATA:  A 61 year old male presents for evaluation of OG tube placement. EXAM: ABDOMEN - 1 VIEW COMPARISON:  Chest x-ray January 21, 2021 FINDINGS: EKG leads project over the patient's chest and abdomen. Bowel gas pattern not well assessed, abdomen incompletely imaged. Imaged focused over the lower chest and upper abdomen with OG tube tip in the gastric fundus. Side port not well delineated, potentially obscured by overlying EKG lead wires. IMPRESSION: OG tube tip in the gastric fundus. Side port not well delineated on current imaging based on  overlying EKG lead wires. The tube is approximately 7-8 cm into the stomach based on projection. Electronically Signed   By: Zetta Bills M.D.   On: 01/21/2022 09:20   CT Angio Chest PE W/Cm &/Or Wo Cm  Result Date: 01/21/2022 CLINICAL DATA:  A 61 year old male presents for suspected pulmonary embolism, now intubated. EXAM: CT ANGIOGRAPHY CHEST WITH CONTRAST TECHNIQUE: Multidetector CT imaging of the chest was performed using the standard protocol during bolus administration of intravenous contrast. Multiplanar CT image reconstructions and MIPs were obtained to evaluate the vascular anatomy. RADIATION DOSE REDUCTION: This exam was performed according to the departmental dose-optimization program which includes automated exposure control, adjustment of the mA and/or kV according to patient size and/or use of iterative reconstruction technique. CONTRAST:  37m OMNIPAQUE IOHEXOL 350 MG/ML SOLN COMPARISON:  Chest x-ray of January 21, 2022. FINDINGS: Cardiovascular: Normal caliber of the thoracic aorta with calcified and noncalcified plaque. Central pulmonary vasculature is normal caliber. Main pulmonary artery  attenuation is 165 Hounsfield units which limits assessment as does the presence of respiratory motion. Accounting for limitations no signs of central or lobar embolus. No proximal segmental embolus to the extent that can be evaluated. Heart size is normal without signs of pericardial effusion. Mediastinum/Nodes: No acute findings in the mediastinum. Endotracheal tube terminates in the mid trachea. Gastric tube courses through the esophagus. No signs of adenopathy in the chest. Lungs/Pleura: No pneumothorax. No consolidation. No pleural effusion. Narrowing of mainstem bronchi suggests bronchomalacia, some dynamic assessment based on respiratory motion in the central chest on the current study with slit like appearance of bronchus intermedius. Material in lower lobe bronchi on the LEFT and to a lesser extent  on the RIGHT with mild bronchial wall thickening. Upper Abdomen: Incidental imaging of upper abdominal contents without acute process related to visualized liver, pancreas, spleen, adrenal glands or kidneys. No acute gastrointestinal process to the extent evaluated. Gastric tube coiled in the stomach. Musculoskeletal: No chest wall abnormality. No acute or significant osseous findings. Review of the MIP images confirms the above findings. IMPRESSION: 1. Limited evaluation, no signs of pulmonary embolism to the central segmental level. 2. Suspected bronchomalacia and potential early aspiration changes. No lobar consolidation or frank imaged itis at this time. 3. Aortic atherosclerosis. 4. Gastric tube coiled in the stomach. Aortic Atherosclerosis (ICD10-I70.0). Electronically Signed   By: Zetta Bills M.D.   On: 01/21/2022 10:27   DG Chest Port 1 View  Result Date: 01/21/2022 CLINICAL DATA:  Status post central line placement EXAM: PORTABLE CHEST 1 VIEW COMPARISON:  AP chest 01/21/2022 earlier same day at 904 hours FINDINGS: 01/21/2022 at 1215 hours Left internal jugular central venous catheter tip overlies the junction of the left brachiocephalic vein and superior vena cava. Enteric tube descends below the diaphragm with the tip overlying the gastric fundus in the left upper quadrant. Cardiac silhouette and mediastinal contours are unchanged and within normal limits with mild calcification seen within the aortic arch. The lungs are clear.  No pleural effusion or pneumothorax. Moderate multilevel degenerative bridging osteophytes of the thoracic spine. IMPRESSION: New left internal jugular central venous catheter tip overlies the junction of the left brachiocephalic vein and superior vena cava. No acute lung process. Electronically Signed   By: Yvonne Kendall M.D.   On: 01/21/2022 12:36   DG Chest Portable 1 View  Result Date: 01/21/2022 CLINICAL DATA:  61 year old male with history of shortness of breath.  EXAM: PORTABLE CHEST 1 VIEW COMPARISON:  Chest x-ray 10/11/2015. FINDINGS: Lung volumes are normal. No consolidative airspace disease. No pleural effusions. No pneumothorax. No pulmonary nodule or mass noted. Pulmonary vasculature and the cardiomediastinal silhouette are within normal limits. IMPRESSION: No radiographic evidence of acute cardiopulmonary disease. Electronically Signed   By: Vinnie Langton M.D.   On: 01/21/2022 06:11    ECHO no prior  TELEMETRY reviewed by me: Rate of 80, with pairs and triplets of PVCs throughout the morning  EKG reviewed by me: initial EKG at 0538 showed sinus tach with rate of 140 and premature atrial complexes, repeat EKG at 0730 showed sinus tach with somewhat improved rate at 103 with PVCs frequently, most recent EKG at 08 54 showed sinus rhythm with ventricular bigeminy rate 98  ASSESSMENT AND PLAN:  The patient is a 61 year old male with a past medical history notable for asthma, COPD, and current tobacco use who presented to [facility] ED in the early morning hours complaining of shortness of breath with worsening cough and congestion.  Per EMS reporting the patient's O2 sats were in the 60s and he was given 2 DuoNebs and Solu-Medrol on route.  He presented to the ED in moderate respiratory distress with diffuse wheezing and had chest tightness without chest pain.  He was given DuoNebs x3 and 2 g of IV mag and was subsequently placed on BiPAP and eventually required intubation due to worsening respiratory status.  Cardiology is consulted because his EKG changes and now show ventricular bigeminy.  #Frequent PVCs/ventricular bigeminy #Acute hypoxic respiratory failure 2/2 COPD/asthma exacerbation #Tobacco abuse #Aortic atherosclerosis on CTA chest The patient presented to [facility] ED early this morning in moderate respiratory distress eventually requiring intubation and sedation for support.  His initial EKG showed sinus tachycardia with a rate of 140 and is now in sinus  rhythm with a rate in the 80s with ventricular bigeminy and periods of PVC triplets.  The patient reportedly complained of significant chest tightness prior to intubation however EKG is without acute ST changes and his troponins are flat 9 - 8 - 7, favor his chest tightness is related to his respiratory distress/initial tachycardia. -Agree with current therapy and supportive care for COPD/asthma exacerbation per PCCM -Agree with amiodarone load and gtt. for reduction of PVC burden/bigeminy -Continuous monitoring on telemetry -Recommend monitoring of electrolytes and replete in mag and potassium as needed -As needed Lasix for volume overload, s/p 40 mg IV Lasix  -Ordered echocardiogram complete, will follow results -Recommend outpatient follow-up with cardiology for risk factor modification with noted aortic atherosclerosis on CT imaging on admission -Agree with smoking cessation therapy in the future  This patient's plan of care was discussed and created with Dr. Katrine Coho and he is in agreement.  Signed: Tristan Schroeder , PA-C 01/21/2022, 12:58 PM [facility]

## 2022-01-21 NOTE — ED Notes (Signed)
Dr Niu at bedside 

## 2022-01-21 NOTE — ED Notes (Signed)
Dr Clyde Lundborg discussing intubation with pt who agrees to be intubated.

## 2022-01-21 NOTE — TOC Initial Note (Signed)
Transition of Care [facility]) - Initial/Assessment Note    Patient Details  Name: Kenneth Benson MRN: [id number] Date of Birth: [birth year]  [facility] CM/SW Contact:    Allayne Butcher, RN Phone Number: 01/21/2022, 10:47 AM  Clinical Narrative:                 Patient admitted to the hospital with asthma exacerbation, history of COPD and current smoker per report.  Patient brought in by EMS, required intubation in the emergency department.  Patient is currently in the ICU on the ventilator, sedated.  TOC will follow.   Expected Discharge Plan:  (TBD) Barriers to Discharge: Continued Medical Work up   Patient Goals and CMS Choice Patient states their goals for this hospitalization and ongoing recovery are:: patient intubated and sedated      Expected Discharge Plan and Services Expected Discharge Plan:  (TBD)                                              Prior Living Arrangements/Services                       Activities of Daily Living      Permission Sought/Granted                  Emotional Assessment              Admission diagnosis:  COPD exacerbation (HCC) [J44.1] Asthma exacerbation [J45.901] Acute on chronic respiratory failure with hypercapnia (HCC) [J96.22] Acute respiratory failure with hypoxia and hypercapnia (HCC) [J96.01, J96.02] Patient Active Problem List   Diagnosis Date Noted   COPD exacerbation (HCC) 01/21/2022   Asthma exacerbation 01/21/2022   Sepsis (HCC) 01/21/2022   Tobacco abuse 01/21/2022   Acute on chronic respiratory failure with hypercapnia (HCC) 01/21/2022   Acute respiratory failure with hypoxia and hypercapnia (HCC)    PCP:  Pcp, No Pharmacy:   [facility] [address]), [place] - [address] [place]) [place] [address] Phone: [phone] Fax: [phone]     Social Determinants of Health (SDOH) Interventions    Readmission Risk  Interventions No flowsheet data found.

## 2022-01-22 ENCOUNTER — Inpatient Hospital Stay: Payer: Self-pay

## 2022-01-22 LAB — STREP PNEUMONIAE URINARY ANTIGEN: Strep Pneumo Urinary Antigen: NEGATIVE

## 2022-01-22 LAB — BASIC METABOLIC PANEL
Anion gap: 8 (ref 5–15)
BUN: 27 mg/dL — ABNORMAL HIGH (ref 6–20)
CO2: 32 mmol/L (ref 22–32)
Calcium: 7.6 mg/dL — ABNORMAL LOW (ref 8.9–10.3)
Chloride: 96 mmol/L — ABNORMAL LOW (ref 98–111)
Creatinine, Ser: 1.16 mg/dL (ref 0.61–1.24)
GFR, Estimated: >60 mL/min (ref >60)
Glucose, Bld: 163 mg/dL — ABNORMAL HIGH (ref 70–99)
Potassium: 5 mmol/L (ref 3.5–5.1)
Sodium: 136 mmol/L (ref 135–145)

## 2022-01-22 LAB — HEPATIC FUNCTION PANEL
ALT: 33 U/L (ref 0–44)
AST: 26 U/L (ref 15–41)
Albumin: 3.6 g/dL (ref 3.5–5.0)
Alkaline Phosphatase: 40 U/L (ref 38–126)
Bilirubin, Direct: 0.1 mg/dL (ref 0.0–0.2)
Indirect Bilirubin: 0.5 mg/dL (ref 0.3–0.9)
Total Bilirubin: 0.6 mg/dL (ref 0.3–1.2)
Total Protein: 6.7 g/dL (ref 6.5–8.1)

## 2022-01-22 LAB — GLUCOSE, CAPILLARY
Glucose-Capillary: 152 mg/dL — ABNORMAL HIGH (ref 70–99)
Glucose-Capillary: 165 mg/dL — ABNORMAL HIGH (ref 70–99)
Glucose-Capillary: 118 mg/dL — ABNORMAL HIGH (ref 70–99)
Glucose-Capillary: 146 mg/dL — ABNORMAL HIGH (ref 70–99)
Glucose-Capillary: 128 mg/dL — ABNORMAL HIGH (ref 70–99)

## 2022-01-22 LAB — BLOOD GAS, ARTERIAL
Acid-Base Excess: 6.8 mmol/L — ABNORMAL HIGH (ref 0.0–2.0)
Acid-Base Excess: 6.2 mmol/L — ABNORMAL HIGH (ref 0.0–2.0)
Acid-Base Excess: 6.5 mmol/L — ABNORMAL HIGH (ref 0.0–2.0)
Bicarbonate: 35.7 mmol/L — ABNORMAL HIGH (ref 20.0–28.0)
Bicarbonate: 37.6 mmol/L — ABNORMAL HIGH (ref 20.0–28.0)
Bicarbonate: 36.3 mmol/L — ABNORMAL HIGH (ref 20.0–28.0)
FIO2: 40 %
FIO2: 0.4 %
FIO2: 0.4 %
Mechanical Rate: 28
Mechanical Rate: 28
O2 Saturation: 97.4 %
O2 Saturation: 88.9 %
O2 Saturation: 98.3 %
PEEP: 10 cmH2O
PEEP: 8 cmH2O
PEEP: 10 cmH2O
Patient temperature: 37
Patient temperature: 37
Patient temperature: 37
Pressure control: 43 cmH2O
Pressure control: 40 cmH2O
Pressure control: 42 cmH2O
RATE: 28 resp/min
RATE: 28 resp/min
RATE: 28 resp/min
pCO2 arterial: 71 mmHg (ref 32–48)
pCO2 arterial: 94 mmHg (ref 32–48)
pCO2 arterial: 79 mmHg (ref 32–48)
pH, Arterial: 7.31 — ABNORMAL LOW (ref 7.35–7.45)
pH, Arterial: 7.21 — ABNORMAL LOW (ref 7.35–7.45)
pH, Arterial: 7.27 — ABNORMAL LOW (ref 7.35–7.45)
pO2, Arterial: 76 mmHg — ABNORMAL LOW (ref 83–108)
pO2, Arterial: 56 mmHg — ABNORMAL LOW (ref 83–108)
pO2, Arterial: 83 mmHg (ref 83–108)

## 2022-01-22 LAB — MAGNESIUM: Magnesium: 2.2 mg/dL (ref 1.7–2.4)

## 2022-01-22 LAB — CBC
HCT: 45.5 % (ref 39.0–52.0)
Hemoglobin: 14.3 g/dL (ref 13.0–17.0)
MCH: 30.5 pg (ref 26.0–34.0)
MCHC: 31.4 g/dL (ref 30.0–36.0)
MCV: 97 fL (ref 80.0–100.0)
Platelets: 224 10*3/uL (ref 150–400)
RBC: 4.69 MIL/uL (ref 4.22–5.81)
RDW: 13.9 % (ref 11.5–15.5)
WBC: 13.9 10*3/uL — ABNORMAL HIGH (ref 4.0–10.5)
nRBC: 0 % (ref 0.0–0.2)

## 2022-01-22 LAB — PHOSPHORUS: Phosphorus: 3.5 mg/dL (ref 2.5–4.6)

## 2022-01-22 LAB — TRIGLYCERIDES: Triglycerides: 93 mg/dL (ref <150)

## 2022-01-22 LAB — HEMOGLOBIN A1C
Hgb A1c MFr Bld: 4.9 % (ref 4.8–5.6)
Mean Plasma Glucose: 93.93 mg/dL

## 2022-01-22 LAB — PROCALCITONIN: Procalcitonin: <0.1 ng/mL

## 2022-01-22 MED ORDER — PANTOPRAZOLE SODIUM 40 MG IV SOLR
40.0000 mg | INTRAVENOUS | Status: DC
  Administered 2022-01-22 – 2022-02-06 (×16): 40 mg via INTRAVENOUS
  Filled 2022-01-22 (×17): qty 10

## 2022-01-22 MED ORDER — SODIUM CHLORIDE 0.9 % IV SOLN
500.0000 mg | INTRAVENOUS | Status: DC
  Administered 2022-01-22: 500 mg via INTRAVENOUS
  Filled 2022-01-22: qty 500

## 2022-01-22 MED ORDER — FUROSEMIDE 10 MG/ML IJ SOLN
20.0000 mg | Freq: Once | INTRAMUSCULAR | Status: AC
  Administered 2022-01-22: 20 mg via INTRAVENOUS
  Filled 2022-01-22: qty 2

## 2022-01-22 NOTE — Consult Note (Deleted)
PHARMACY CONSULT NOTE  Pharmacy Consult for Electrolyte Monitoring and Replacement   Recent Labs: Potassium (mmol/L)  Date Value  01/22/2022 5.0   Magnesium (mg/dL)  Date Value  79/89/2119 2.2   Calcium (mg/dL)  Date Value  41/74/0814 7.6 (L)   Albumin (g/dL)  Date Value  48/18/5631 3.6   Phosphorus (mg/dL)  Date Value  49/70/2637 3.5   Sodium (mmol/L)  Date Value  01/22/2022 136   Assessment: Patient is a 61 y/o M with medical history including COPD / asthma and tobacco abuse who presented to the ED 2/24 with acute respiratory failure secondary to asthma / COPD exacerbation. Patient ultimately required intubation. Disposition is the ICU. Pharmacy consulted to assist with electrolyte monitoring and replacement as indicated.  Goal of Therapy:  Electrolytes within normal limits  Plan:  --No electrolyte replacement indicated at this time --Follow-up electrolytes with AM labs   Jean Alejos A 01/22/2022 10:30 AM

## 2022-01-22 NOTE — Progress Notes (Signed)
NAME:  Kenneth Benson, MRN:  [id number], DOB:  11/11/1961, LOS: 1 ADMISSION DATE:  01/21/2022, CONSULTATION DATE: 01/21/2022 REFERRING MD: Dr. Cheri Fowler, CHIEF COMPLAINT: Shortness of Breath    History of Present Illness:  This is a 61 yo male who presented to [facility] ER via EMS on 02/24 with c/o shortness of breath, cough, wheezing, and congestion onset of symptoms several days prior to presentation. He reported using his inhaler without relief of symptoms.  EMS reported pt severely hypoxic with O2 sats in the 60's.  He received duoneb's x2 and 125 mg of iv solumedrol en route to the ER.  All hx obtained from chart review pt currently sedated and mechanically intubated with no family at bedside.   ED Course Upon arrival to the ER pt noted to be in moderate respiratory distress with diffuse wheezes per ED provider notes.  CXR, COVID-19/Influenza A&B, and pct negative.  VBG revealed pH 7.17/pCO2 99/bicarb 36.1.  EKG revealed sinus tachycardia hr 140 without ST elevation/depression and troponin 9.  He received duoneb x3 and 2 g of iv magnesium.  He was subsequently placed on Bipap with initial improvement of respiratory status.  However, respiratory status rapidly declined and pt required mechanical intubation.  He received an additional 2 g of iv magnesium and duoneb post intubation.  PCCM team contacted for ICU admission.  Pertinent  Medical History  COPD Current Smoker  Asthma   Significant Hospital Events: Including procedures, antibiotic start and stop dates in addition to other pertinent events   02/24: Pt admitted to ICU with acute on chronic hypercapnic hypoxic respiratory failure secondary to asthma and COPD exacerbation requiring mechanical intubation  02/25: Remains intubated, vent changes allowed  Interim History / Subjective:  Patient remains heavily sedated due to status asthmaticus.  Cannot perform wake up assessment due to need for paralytics and heavy sedation for adequate  ventilation.  Peak airway pressures down to the 48 range today.    Objective   Blood pressure 111/77, pulse 78, temperature 100.2 F (37.9 C), resp. rate (!) 28, height 6' 0.01" (1.829 m), weight 114.5 kg, SpO2 99 %. CVP:  [18 mmHg-19 mmHg] 18 mmHg  Vent Mode: PCV FiO2 (%):  [40 %-70 %] 40 % Set Rate:  [20 bmp-28 bmp] 28 bmp Vt Set:  [480 mL-500 mL] 480 mL PEEP:  [8 cmH20-10 cmH20] 10 cmH20 Plateau Pressure:  [30 cmH20-32 cmH20] 30 cmH20   Intake/Output Summary (Last 24 hours) at 01/22/2022 0841 Last data filed at 01/22/2022 9937 Gross per 24 hour  Intake 6567.22 ml  Output 1910 ml  Net 4657.22 ml    Filed Weights   01/21/22 0538 01/21/22 1017 01/22/22 0412  Weight: 102.1 kg 107.2 kg 114.5 kg    Examination: General: Critically ill appearing male, heavily sedated and mechanically ventilated HEENT: supple, JVD present, trachea midline, no crepitus Lungs: diffuse expiratory and inspiratory wheezes throughout, synchronous with the vent Cardiovascular: Sinus with frequent PVC's, no R/G, 2+ radial/2+ distal pulses, no edema  Abdomen: +BS x4, obese, soft, non tender, non distended  Extremities: normal bulk and tone, no edema  Neuro: Heavily sedated, on paralytics due to severe respiratory failure  GU: indwelling foley draining clear yellow urine   Resolved Hospital Problem list   N/A  Assessment & Plan:  Acute on chronic hypoxic hypercapnic respiratory failure secondary to asthma and COPD exacerbation  Status asthmaticus Respiratory acidosis, severe due to the above Full vent support for now: vent settings reviewed with RT Currently on  pressure control ventilation, changes made today SBT once all parameters met  Continue aminophylline gtt: monitor theophylline levels  IV steroids: wean as tolerated  Continue scheduled and prn bronchodilator therapy  Respiratory panel positive for rhinovirus Follow respiratory culture Trend WBC and monitor fever curve  Procalcitonin less  than 0.10, withhold antibiotics for now Discontinued Azithromycin as on amiodarone to prevent QT prolongation PAD protocol: maintain RASS goal -4 to -5 for now  Nimbex, propofol, and fentanyl gtts to maintain RASS goal  WUA once pt stabilized  Continue bowel regimen  Once extubated will need smoking cessation counseling   Ventricular trigeminy with frequent PVC's Continuous telemetry monitoring  Replace electrolytes as indicated  Echo: LVEF 60 to 65%, mild LV dilatation increase right ventricular volume overload Troponins normal  Hyperkalemia - corrected Due to respiratory acidosis Ventilator changes made Patient required bicarb infusion Bicarb infusion now off   Best Practice (right click and "Reselect all SmartList Selections" daily)   Diet/type: NPO DVT prophylaxis: LMWH GI prophylaxis: PPI Lines: Central line and yes and it is still needed Foley:  Yes, and it is still needed Code Status:  full code Last date of multidisciplinary goals of care discussion [01/21/2022]  Patient's significant other is Scientist, research (medical) no other family known Labs   CBC: Recent Labs  Lab 01/21/22 0544 01/22/22 0358  WBC 8.4 13.9*  NEUTROABS 6.4  --   HGB 15.9 14.3  HCT 50.9 45.5  MCV 96.8 97.0  PLT 191 224     Basic Metabolic Panel: Recent Labs  Lab 01/21/22 0544 01/21/22 1021 01/21/22 1130 01/21/22 2118 01/22/22 0358  NA 143  --  139  --  136  K 4.1  --  5.6* 5.3* 5.0  CL 102  --  101  --  96*  CO2 34*  --  33*  --  32  GLUCOSE 129*  --  167*  --  163*  BUN 16  --  18  --  27*  CREATININE 0.62  --  0.90  --  1.16  CALCIUM 8.4*  --  7.8*  --  7.6*  MG  --  2.6* 2.6* 2.4 2.2  PHOS  --   --  5.5*  --  3.5    GFR: Estimated Creatinine Clearance: 89.6 mL/min (by C-G formula based on SCr of 1.16 mg/dL). Recent Labs  Lab 01/21/22 0544 01/21/22 0854 01/21/22 1021 01/22/22 0358  PROCALCITON  --  <0.10  --  <0.10  WBC 8.4  --   --  13.9*  LATICACIDVEN  --  1.0 1.1  --       Liver Function Tests: Recent Labs  Lab 01/21/22 1130 01/22/22 0358  AST  --  26  ALT  --  33  ALKPHOS  --  40  BILITOT  --  0.6  PROT  --  6.7  ALBUMIN 4.1 3.6    No results for input(s): LIPASE, AMYLASE in the last 168 hours. No results for input(s): AMMONIA in the last 168 hours.  ABG    Component Value Date/Time   PHART 7.31 (L) 01/22/2022 0358   PCO2ART 71 (HH) 01/22/2022 0358   PO2ART 76 (L) 01/22/2022 0358   HCO3 35.7 (H) 01/22/2022 0358   O2SAT 97.4 01/22/2022 0358      Coagulation Profile: No results for input(s): INR, PROTIME in the last 168 hours.  Cardiac Enzymes: No results for input(s): CKTOTAL, CKMB, CKMBINDEX, TROPONINI in the last 168 hours.  HbA1C: Hgb A1c MFr Bld  Date/Time Value Ref Range Status  01/21/2022 02:36 PM 4.9 4.8 - 5.6 % Final    Comment:    (NOTE) Pre diabetes:          5.7%-6.4%  Diabetes:              >6.4%  Glycemic control for   <7.0% adults with diabetes     CBG: Recent Labs  Lab 01/21/22 1718 01/21/22 1936 01/21/22 2344 01/22/22 0350 01/22/22 0755  GLUCAP 172* 169* 143* 152* 165*     Review of Systems:   Unable to assess pt mechanically intubated   Allergies No Known Allergies   Scheduled Meds:  artificial tears  1 application Both Eyes U0T   chlorhexidine gluconate (MEDLINE KIT)  15 mL Mouth Rinse BID   Chlorhexidine Gluconate Cloth  6 each Topical Q0600   cisatracurium  10 mg Intravenous Once   docusate  100 mg Per Tube BID   enoxaparin (LOVENOX) injection  40 mg Subcutaneous Q24H   insulin aspart  0-15 Units Subcutaneous Q4H   ipratropium-albuterol  3 mL Nebulization Q4H   mouth rinse  15 mL Mouth Rinse 10 times per day   methylPREDNISolone (SOLU-MEDROL) injection  60 mg Intravenous Q12H   pantoprazole (PROTONIX) IV  40 mg Intravenous Q24H   polyethylene glycol  17 g Per Tube Daily   Continuous Infusions:  sodium chloride 250 mL (01/22/22 0752)   aminophylline 1 mg/mL infusion 0.7  mg/kg/hr (01/22/22 0729)   amiodarone 30 mg/hr (01/22/22 0753)   cisatracurium (NIMBEX) infusion 2 mg/mL 5 mcg/kg/min (01/22/22 0729)   fentaNYL infusion INTRAVENOUS 300 mcg/hr (01/22/22 0729)   phenylephrine (NEO-SYNEPHRINE) Adult infusion 170 mcg/min (01/22/22 0729)   propofol (DIPRIVAN) infusion 50 mcg/kg/min (01/22/22 0732)   vasopressin Stopped (01/21/22 1740)   PRN Meds:.acetaminophen, albuterol, docusate sodium, fentaNYL (SUBLIMAZE) injection, fentaNYL (SUBLIMAZE) injection, ondansetron (ZOFRAN) IV, polyethylene glycol   Critical care time: 45 minutes     The patient is critically ill with multiple organ systems failure and requires high complexity decision making for assessment and support, frequent evaluation and titration of therapies, application of advanced monitoring technologies and extensive interpretation of multiple databases.  Patient has required frequent ventilator adjustments.   Renold Don, MD Advanced Bronchoscopy PCCM  [facility]    *This note was dictated using voice recognition software/Dragon.  Despite best efforts to proofread, errors can occur which can change the meaning. Any transcriptional errors that result from this process are unintentional and may not be fully corrected at the time of dictation.

## 2022-01-22 NOTE — H&P (Deleted)
NAME:  Kenneth Benson, MRN:  [id number], DOB:  11-22-1961, LOS: 1 ADMISSION DATE:  01/21/2022, CONSULTATION DATE: 01/21/2022 REFERRING MD: Dr. Cheri Fowler, CHIEF COMPLAINT: Shortness of Breath    History of Present Illness:  This is a 61 yo male who presented to [facility] ER via EMS on 02/24 with c/o shortness of breath, cough, wheezing, and congestion onset of symptoms several days prior to presentation. He reported using his inhaler without relief of symptoms.  EMS reported pt severely hypoxic with O2 sats in the 60's.  He received duoneb's x2 and 125 mg of iv solumedrol en route to the ER.  All hx obtained from chart review pt currently sedated and mechanically intubated with no family at bedside.   ED Course Upon arrival to the ER pt noted to be in moderate respiratory distress with diffuse wheezes per ED provider notes.  CXR, COVID-19/Influenza A&B, and pct negative.  VBG revealed pH 7.17/pCO2 99/bicarb 36.1.  EKG revealed sinus tachycardia hr 140 without ST elevation/depression and troponin 9.  He received duoneb x3 and 2 g of iv magnesium.  He was subsequently placed on Bipap with initial improvement of respiratory status.  However, respiratory status rapidly declined and pt required mechanical intubation.  He received an additional 2 g of iv magnesium and duoneb post intubation.  PCCM team contacted for ICU admission.  Pertinent  Medical History  COPD Current Smoker  Asthma   Significant Hospital Events: Including procedures, antibiotic start and stop dates in addition to other pertinent events   02/24: Pt admitted to ICU with acute on chronic hypercapnic hypoxic respiratory failure secondary to asthma and COPD exacerbation requiring mechanical intubation  02/25: Remains intubated, vent changes allowed  Interim History / Subjective:  Patient remains heavily sedated due to status asthmaticus.  Cannot perform wake up assessment due to need for paralytics and heavy sedation for adequate  ventilation.  Peak airway pressures down to the 48 range today.    Objective   Blood pressure 114/71, pulse (!) 35, temperature 100.2 F (37.9 C), resp. rate (!) 26, height 6' 0.01" (1.829 m), weight 114.5 kg, SpO2 99 %. CVP:  [18 mmHg-19 mmHg] 18 mmHg  Vent Mode: PCV FiO2 (%):  [40 %-70 %] 40 % Set Rate:  [20 bmp-28 bmp] 28 bmp Vt Set:  [480 mL-500 mL] 480 mL PEEP:  [8 cmH20-10 cmH20] 10 cmH20 Plateau Pressure:  [30 cmH20-32 cmH20] 30 cmH20   Intake/Output Summary (Last 24 hours) at 01/22/2022 8466 Last data filed at 01/22/2022 5993 Gross per 24 hour  Intake 6567.22 ml  Output 1910 ml  Net 4657.22 ml    Filed Weights   01/21/22 0538 01/21/22 1017 01/22/22 0412  Weight: 102.1 kg 107.2 kg 114.5 kg    Examination: General: Critically ill appearing male, heavily sedated and mechanically ventilated HEENT: supple, JVD present, trachea midline, no crepitus Lungs: diffuse expiratory and inspiratory wheezes throughout, synchronous with the vent Cardiovascular: Sinus with frequent PVC's, no R/G, 2+ radial/2+ distal pulses, no edema  Abdomen: +BS x4, obese, soft, non tender, non distended  Extremities: normal bulk and tone, no edema  Neuro: Heavily sedated, on paralytics due to severe respiratory failure  GU: indwelling foley draining clear yellow urine   Resolved Hospital Problem list   N/A  Assessment & Plan:  Acute on chronic hypoxic hypercapnic respiratory failure secondary to asthma and COPD exacerbation  Status asthmaticus Respiratory acidosis, severe due to the above Full vent support for now: vent settings reviewed with RT Currently  on pressure control ventilation, changes made today SBT once all parameters met  Continue aminophylline gtt: monitor theophylline levels  IV steroids: wean as tolerated  Continue scheduled and prn bronchodilator therapy  Respiratory panel positive for rhinovirus Follow respiratory culture Trend WBC and monitor fever curve  Procalcitonin  less than 0.10, withhold antibiotics for now Discontinued Azithromycin as on amiodarone to prevent QT prolongation PAD protocol: maintain RASS goal -4 to -5 for now  Nimbex, propofol, and fentanyl gtts to maintain RASS goal  WUA once pt stabilized  Continue bowel regimen  Once extubated will need smoking cessation counseling   Ventricular trigeminy with frequent PVC's Continuous telemetry monitoring  Replace electrolytes as indicated  Echo: LVEF 60 to 65%, mild LV dilatation increase right ventricular volume overload Troponins normal  Hyperkalemia - corrected Due to respiratory acidosis Ventilator changes made Patient required bicarb infusion Bicarb infusion now off   Best Practice (right click and "Reselect all SmartList Selections" daily)   Diet/type: NPO DVT prophylaxis: LMWH GI prophylaxis: PPI Lines: Central line and yes and it is still needed Foley:  Yes, and it is still needed Code Status:  full code Last date of multidisciplinary goals of care discussion [01/21/2022]  Patient's significant other is Scientist, research (medical) no other family known Labs   CBC: Recent Labs  Lab 01/21/22 0544 01/22/22 0358  WBC 8.4 13.9*  NEUTROABS 6.4  --   HGB 15.9 14.3  HCT 50.9 45.5  MCV 96.8 97.0  PLT 191 224     Basic Metabolic Panel: Recent Labs  Lab 01/21/22 0544 01/21/22 1021 01/21/22 1130 01/21/22 2118 01/22/22 0358  NA 143  --  139  --  136  K 4.1  --  5.6* 5.3* 5.0  CL 102  --  101  --  96*  CO2 34*  --  33*  --  32  GLUCOSE 129*  --  167*  --  163*  BUN 16  --  18  --  27*  CREATININE 0.62  --  0.90  --  1.16  CALCIUM 8.4*  --  7.8*  --  7.6*  MG  --  2.6* 2.6* 2.4 2.2  PHOS  --   --  5.5*  --  3.5    GFR: Estimated Creatinine Clearance: 89.6 mL/min (by C-G formula based on SCr of 1.16 mg/dL). Recent Labs  Lab 01/21/22 0544 01/21/22 0854 01/21/22 1021 01/22/22 0358  PROCALCITON  --  <0.10  --  <0.10  WBC 8.4  --   --  13.9*  LATICACIDVEN  --  1.0 1.1   --      Liver Function Tests: Recent Labs  Lab 01/21/22 1130 01/22/22 0358  AST  --  26  ALT  --  33  ALKPHOS  --  40  BILITOT  --  0.6  PROT  --  6.7  ALBUMIN 4.1 3.6   No results for input(s): LIPASE, AMYLASE in the last 168 hours. No results for input(s): AMMONIA in the last 168 hours.  ABG    Component Value Date/Time   PHART 7.31 (L) 01/22/2022 0358   PCO2ART 71 (HH) 01/22/2022 0358   PO2ART 76 (L) 01/22/2022 0358   HCO3 35.7 (H) 01/22/2022 0358   O2SAT 97.4 01/22/2022 0358      Coagulation Profile: No results for input(s): INR, PROTIME in the last 168 hours.  Cardiac Enzymes: No results for input(s): CKTOTAL, CKMB, CKMBINDEX, TROPONINI in the last 168 hours.  HbA1C: Hgb A1c MFr Bld  Date/Time Value Ref Range Status  01/21/2022 02:36 PM 4.9 4.8 - 5.6 % Final    Comment:    (NOTE) Pre diabetes:          5.7%-6.4%  Diabetes:              >6.4%  Glycemic control for   <7.0% adults with diabetes     CBG: Recent Labs  Lab 01/21/22 1718 01/21/22 1936 01/21/22 2344 01/22/22 0350 01/22/22 0755  GLUCAP 172* 169* 143* 152* 165*     Review of Systems:   Unable to assess pt mechanically intubated   Allergies No Known Allergies   Scheduled Meds:  artificial tears  1 application Both Eyes U0T   chlorhexidine gluconate (MEDLINE KIT)  15 mL Mouth Rinse BID   Chlorhexidine Gluconate Cloth  6 each Topical Q0600   cisatracurium  10 mg Intravenous Once   docusate  100 mg Per Tube BID   enoxaparin (LOVENOX) injection  40 mg Subcutaneous Q24H   insulin aspart  0-15 Units Subcutaneous Q4H   ipratropium-albuterol  3 mL Nebulization Q4H   mouth rinse  15 mL Mouth Rinse 10 times per day   methylPREDNISolone (SOLU-MEDROL) injection  60 mg Intravenous Q12H   pantoprazole (PROTONIX) IV  40 mg Intravenous Q24H   polyethylene glycol  17 g Per Tube Daily   Continuous Infusions:  sodium chloride 250 mL (01/22/22 0752)   aminophylline 1 mg/mL infusion 0.7  mg/kg/hr (01/22/22 0729)   amiodarone 30 mg/hr (01/22/22 0753)   cisatracurium (NIMBEX) infusion 2 mg/mL 5 mcg/kg/min (01/22/22 0729)   fentaNYL infusion INTRAVENOUS 300 mcg/hr (01/22/22 0729)   phenylephrine (NEO-SYNEPHRINE) Adult infusion 170 mcg/min (01/22/22 0729)   propofol (DIPRIVAN) infusion 50 mcg/kg/min (01/22/22 0732)   vasopressin Stopped (01/21/22 1740)   PRN Meds:.acetaminophen, albuterol, docusate sodium, fentaNYL (SUBLIMAZE) injection, fentaNYL (SUBLIMAZE) injection, ondansetron (ZOFRAN) IV, polyethylene glycol   Critical care time: 45 minutes     The patient is critically ill with multiple organ systems failure and requires high complexity decision making for assessment and support, frequent evaluation and titration of therapies, application of advanced monitoring technologies and extensive interpretation of multiple databases.  Patient has required frequent ventilator adjustments.   Renold Don, MD Advanced Bronchoscopy PCCM  [facility]    *This note was dictated using voice recognition software/Dragon.  Despite best efforts to proofread, errors can occur which can change the meaning. Any transcriptional errors that result from this process are unintentional and may not be fully corrected at the time of dictation.

## 2022-01-22 NOTE — Procedures (Signed)
Endotracheal Intubation: Patient intubated for acute respiratory failure with hypoxia and hypercapnia in the setting of status asthmaticus.  Endotracheal tube balloon failure with persistent leak and inability to keep tidal volumes.  Patient required exchange of ET tube.  Consent: Emergent.   Hand washing performed prior to starting the procedure.   Medications administered for sedation prior to procedure:  Patient on sedative infusions and paralytics already due to status asthmaticus and ventilator requirements.   A time out procedure was called and correct patient, name, & ID confirmed. Needed supplies and equipment were assembled and checked to include ETT, 10 ml syringe, Glidescope, Mac and Miller blades, suction, oxygen and bag mask valve, end tidal CO2 monitor.  Tube exchanger.   Patient was positioned to align the mouth and pharynx to facilitate visualization of the glottis.   Patient preoxygenated with 100% FiO2.  Glidescope inserted and glottis visualized.  The existing ET tube could be seen with balloon at the vocal cords.  This tube was advanced however despite repositioning leak persisted.  At this point endotracheal tube exchanger was slipped through the existing ET tube under direct visualization with the glidescope.  There were copious whitish to bloody secretions noted which were suctioned until cleared.  The existing ET tube was then pulled out and the new ET tube was passed over the tube exchanger.  Under direct visualization with the glidescope the tube was positioned at 26 cm to the teeth.  The tube was then secured.  Patient O2 sats remained 96 to 100% throughout the procedure.  And was placed back on the ventilator without sequela.  Patient tolerated the procedure well.  ETT was secured at 26 cm mark.  Placement was confirmed by auscuitation of lungs with good breath sounds bilaterally and no stomach sounds.  Condensation was noted on endotracheal tube.   Pulse ox 98%.  CO2  detector in place with appropriate color change.   Complications: None .   Operator: [place]/Manaal Mandala  Chest radiograph ordered and tube can be advanced 1 cm, no pneumothorax overall in good position.   Comments: OGT remained in place throughout the procedure.  Renold Don, MD Advanced Bronchoscopy PCCM [facility]    *This note was dictated using voice recognition software/Dragon.  Despite best efforts to proofread, errors can occur which can change the meaning. Any transcriptional errors that result from this process are unintentional and may not be fully corrected at the time of dictation.

## 2022-01-22 NOTE — Progress Notes (Signed)
[facility] Cardiology    SUBJECTIVE: Intubated sedated   Vitals:   01/22/22 0732 01/22/22 0800 01/22/22 0830 01/22/22 0831  BP: 105/68 107/67 111/77 111/77  Pulse: 65 75 79 78  Resp: (!) 25 (!) 25 (!) 28 (!) 28  Temp:      TempSrc:      SpO2: 100% 97% 99% 99%  Weight:      Height:         Intake/Output Summary (Last 24 hours) at 01/22/2022 1006 Last data filed at 01/22/2022 [id number] Gross per 24 hour  Intake 5767.22 ml  Output 1910 ml  Net 3857.22 ml      PHYSICAL EXAM  General: Well developed, well nourished, in no acute distress HEENT:  Normocephalic and atramatic Neck:  No JVD.  Lungs: Clear bilaterally to auscultation and percussion. Heart: HRRR . Normal S1 and S2 without gallops or murmurs.  Abdomen: Bowel sounds are positive, abdomen soft and non-tender  Msk:  Back normal, normal gait. Normal strength and tone for age. Extremities: No clubbing, cyanosis or edema.   Neuro: Alert and oriented X 3. Psych:  Good affect, responds appropriately   LABS: Basic Metabolic Panel: Recent Labs    01/21/22 1130 01/21/22 2118 01/22/22 0358  NA 139  --  136  K 5.6* 5.3* 5.0  CL 101  --  96*  CO2 33*  --  32  GLUCOSE 167*  --  163*  BUN 18  --  27*  CREATININE 0.90  --  1.16  CALCIUM 7.8*  --  7.6*  MG 2.6* 2.4 2.2  PHOS 5.5*  --  3.5   Liver Function Tests: Recent Labs    01/21/22 1130 01/22/22 0358  AST  --  26  ALT  --  33  ALKPHOS  --  40  BILITOT  --  0.6  PROT  --  6.7  ALBUMIN 4.1 3.6   No results for input(s): LIPASE, AMYLASE in the last 72 hours. CBC: Recent Labs    01/21/22 0544 01/22/22 0358  WBC 8.4 13.9*  NEUTROABS 6.4  --   HGB 15.9 14.3  HCT 50.9 45.5  MCV 96.8 97.0  PLT 191 224   Cardiac Enzymes: No results for input(s): CKTOTAL, CKMB, CKMBINDEX, TROPONINI in the last 72 hours. BNP: Invalid input(s): POCBNP D-Dimer: No results for input(s): DDIMER in the last 72 hours. Hemoglobin A1C: Recent Labs    01/21/22 1436  HGBA1C 4.9    Fasting Lipid Panel: Recent Labs    01/22/22 0358  TRIG 93   Thyroid Function Tests: No results for input(s): TSH, T4TOTAL, T3FREE, THYROIDAB in the last 72 hours.  Invalid input(s): FREET3 Anemia Panel: No results for input(s): VITAMINB12, FOLATE, FERRITIN, TIBC, IRON, RETICCTPCT in the last 72 hours.  DG Chest 1 View  Result Date: 01/21/2022 CLINICAL DATA:  Intubation EXAM: CHEST  1 VIEW COMPARISON:  None. FINDINGS: Endotracheal tube 4.7 cm from carina in good position. NG tube extends the stomach. Lungs are clear.  Normal cardiac silhouette.  No pneumothorax. IMPRESSION: 1. Support apparatus in good position. 2. No acute cardiopulmonary findings. Electronically Signed   By: Suzy Bouchard M.D.   On: 01/21/2022 09:19   DG Abdomen 1 View  Result Date: 01/21/2022 CLINICAL DATA:  A 61 year old male presents for evaluation of OG tube placement. EXAM: ABDOMEN - 1 VIEW COMPARISON:  Chest x-ray January 21, 2021 FINDINGS: EKG leads project over the patient's chest and abdomen. Bowel gas pattern not well assessed, abdomen incompletely imaged.  Imaged focused over the lower chest and upper abdomen with OG tube tip in the gastric fundus. Side port not well delineated, potentially obscured by overlying EKG lead wires. IMPRESSION: OG tube tip in the gastric fundus. Side port not well delineated on current imaging based on overlying EKG lead wires. The tube is approximately 7-8 cm into the stomach based on projection. Electronically Signed   By: Zetta Bills M.D.   On: 01/21/2022 09:20   CT Angio Chest PE W/Cm &/Or Wo Cm  Result Date: 01/21/2022 CLINICAL DATA:  A 60 year old male presents for suspected pulmonary embolism, now intubated. EXAM: CT ANGIOGRAPHY CHEST WITH CONTRAST TECHNIQUE: Multidetector CT imaging of the chest was performed using the standard protocol during bolus administration of intravenous contrast. Multiplanar CT image reconstructions and MIPs were obtained to evaluate the  vascular anatomy. RADIATION DOSE REDUCTION: This exam was performed according to the departmental dose-optimization program which includes automated exposure control, adjustment of the mA and/or kV according to patient size and/or use of iterative reconstruction technique. CONTRAST:  15mL OMNIPAQUE IOHEXOL 350 MG/ML SOLN COMPARISON:  Chest x-ray of January 21, 2022. FINDINGS: Cardiovascular: Normal caliber of the thoracic aorta with calcified and noncalcified plaque. Central pulmonary vasculature is normal caliber. Main pulmonary artery attenuation is 165 Hounsfield units which limits assessment as does the presence of respiratory motion. Accounting for limitations no signs of central or lobar embolus. No proximal segmental embolus to the extent that can be evaluated. Heart size is normal without signs of pericardial effusion. Mediastinum/Nodes: No acute findings in the mediastinum. Endotracheal tube terminates in the mid trachea. Gastric tube courses through the esophagus. No signs of adenopathy in the chest. Lungs/Pleura: No pneumothorax. No consolidation. No pleural effusion. Narrowing of mainstem bronchi suggests bronchomalacia, some dynamic assessment based on respiratory motion in the central chest on the current study with slit like appearance of bronchus intermedius. Material in lower lobe bronchi on the LEFT and to a lesser extent on the RIGHT with mild bronchial wall thickening. Upper Abdomen: Incidental imaging of upper abdominal contents without acute process related to visualized liver, pancreas, spleen, adrenal glands or kidneys. No acute gastrointestinal process to the extent evaluated. Gastric tube coiled in the stomach. Musculoskeletal: No chest wall abnormality. No acute or significant osseous findings. Review of the MIP images confirms the above findings. IMPRESSION: 1. Limited evaluation, no signs of pulmonary embolism to the central segmental level. 2. Suspected bronchomalacia and potential  early aspiration changes. No lobar consolidation or frank imaged itis at this time. 3. Aortic atherosclerosis. 4. Gastric tube coiled in the stomach. Aortic Atherosclerosis (ICD10-I70.0). Electronically Signed   By: Zetta Bills M.D.   On: 01/21/2022 10:27   DG Chest Port 1 View  Result Date: 01/22/2022 CLINICAL DATA:  Follow-up for respiratory failure. EXAM: PORTABLE CHEST 1 VIEW COMPARISON:  01/21/2022 and older studies. FINDINGS: Cardiac silhouette normal in size. Clear lungs.  No pneumothorax. Endotracheal tube, left internal jugular central venous line and nasal/orogastric tube are stable. IMPRESSION: 1. No acute cardiopulmonary disease. 2. No change from the previous day's study. Electronically Signed   By: Lajean Manes M.D.   On: 01/22/2022 09:24   DG Chest Port 1 View  Result Date: 01/21/2022 CLINICAL DATA:  Status post central line placement EXAM: PORTABLE CHEST 1 VIEW COMPARISON:  AP chest 01/21/2022 earlier same day at 904 hours FINDINGS: 01/21/2022 at 1215 hours Left internal jugular central venous catheter tip overlies the junction of the left brachiocephalic vein and superior vena cava. Enteric  tube descends below the diaphragm with the tip overlying the gastric fundus in the left upper quadrant. Cardiac silhouette and mediastinal contours are unchanged and within normal limits with mild calcification seen within the aortic arch. The lungs are clear.  No pleural effusion or pneumothorax. Moderate multilevel degenerative bridging osteophytes of the thoracic spine. IMPRESSION: New left internal jugular central venous catheter tip overlies the junction of the left brachiocephalic vein and superior vena cava. No acute lung process. Electronically Signed   By: Yvonne Kendall M.D.   On: 01/21/2022 12:36   DG Chest Portable 1 View  Result Date: 01/21/2022 CLINICAL DATA:  61 year old male with history of shortness of breath. EXAM: PORTABLE CHEST 1 VIEW COMPARISON:  Chest x-ray 10/11/2015.  FINDINGS: Lung volumes are normal. No consolidative airspace disease. No pleural effusions. No pneumothorax. No pulmonary nodule or mass noted. Pulmonary vasculature and the cardiomediastinal silhouette are within normal limits. IMPRESSION: No radiographic evidence of acute cardiopulmonary disease. Electronically Signed   By: Vinnie Langton M.D.   On: 01/21/2022 06:11   ECHOCARDIOGRAM COMPLETE  Result Date: 01/21/2022    ECHOCARDIOGRAM REPORT   Patient Name:   Kenneth Benson [facility] Date of Exam: 01/21/2022 Medical Rec #:  [id number]     Height:       72.0 in Accession #:    [id number]    Weight:       236.3 lb Date of Birth:  1961/09/01    BSA:          2.287 m Patient Age:    70 years      BP:           110/67 mmHg Patient Gender: M             HR:           84 bpm. Exam Location:  [facility] Procedure: 2D Echo, Cardiac Doppler and Color Doppler Indications:     Acute respiratory distress R06.03  History:         Patient has no prior history of Echocardiogram examinations.                  Tobacco abuse.  Sonographer:     Sherrie Sport Referring Phys:  [id number] CARMEN L GONZALEZ Diagnosing Phys: Yolonda Kida MD  Sonographer Comments: Suboptimal apical window, suboptimal parasternal window and echo performed with patient supine and on artificial respirator. IMPRESSIONS  1. Left ventricular ejection fraction, by estimation, is 60 to 65%. The left ventricle has normal function. The left ventricle has no regional wall motion abnormalities. The left ventricular internal cavity size was mildly dilated. Left ventricular diastolic parameters were normal. There is the interventricular septum is flattened in diastole ('D' shaped left ventricle), consistent with right ventricular volume overload.  2. Right ventricular systolic function is normal. The right ventricular size is normal.  3. The mitral valve is normal in structure. Trivial mitral valve regurgitation.  4. The aortic valve is normal in structure. Aortic valve regurgitation is  not visualized. FINDINGS  Left Ventricle: Left ventricular ejection fraction, by estimation, is 60 to 65%. The left ventricle has normal function. The left ventricle has no regional wall motion abnormalities. The left ventricular internal cavity size was mildly dilated. There is  borderline concentric left ventricular hypertrophy. The interventricular septum is flattened in diastole ('D' shaped left ventricle), consistent with right ventricular volume overload. Left ventricular diastolic parameters were normal. Right Ventricle: The right ventricular size is normal. No increase in right ventricular wall  thickness. Right ventricular systolic function is normal. Left Atrium: Left atrial size was normal in size. Right Atrium: Right atrial size was normal in size. Pericardium: There is no evidence of pericardial effusion. Mitral Valve: The mitral valve is normal in structure. Trivial mitral valve regurgitation. Tricuspid Valve: The tricuspid valve is normal in structure. Tricuspid valve regurgitation is trivial. Aortic Valve: The aortic valve is normal in structure. Aortic valve regurgitation is not visualized. Aortic valve mean gradient measures 2.0 mmHg. Aortic valve peak gradient measures 2.8 mmHg. Aortic valve area, by VTI measures 3.82 cm. Pulmonic Valve: The pulmonic valve was normal in structure. Pulmonic valve regurgitation is trivial. Aorta: The ascending aorta was not well visualized. IAS/Shunts: No atrial level shunt detected by color flow Doppler.  LEFT VENTRICLE PLAX 2D LVIDd:         5.10 cm   Diastology LVIDs:         3.30 cm   LV e' medial:    10.80 cm/s LV PW:         1.10 cm   LV E/e' medial:  7.5 LV IVS:        0.90 cm   LV e' lateral:   6.53 cm/s LVOT diam:     2.30 cm   LV E/e' lateral: 12.3 LV SV:         52 LV SV Index:   23 LVOT Area:     4.15 cm  RIGHT VENTRICLE RV Basal diam:  3.40 cm RV S prime:     7.94 cm/s TAPSE (M-mode): 2.4 cm LEFT ATRIUM             Index        RIGHT ATRIUM            Index LA diam:        3.40 cm 1.49 cm/m   RA Area:     16.20 cm LA Vol (A2C):   36.9 ml 16.13 ml/m  RA Volume:   40.10 ml  17.53 ml/m LA Vol (A4C):   31.2 ml 13.64 ml/m LA Biplane Vol: 34.4 ml 15.04 ml/m  AORTIC VALVE                    PULMONIC VALVE AV Area (Vmax):    4.10 cm     PV Vmax:        0.75 m/s AV Area (Vmean):   3.30 cm     PV Vmean:       45.800 cm/s AV Area (VTI):     3.82 cm     PV VTI:         0.131 m AV Vmax:           83.20 cm/s   PV Peak grad:   2.3 mmHg AV Vmean:          59.200 cm/s  PV Mean grad:   1.0 mmHg AV VTI:            0.137 m      RVOT Peak grad: 3 mmHg AV Peak Grad:      2.8 mmHg AV Mean Grad:      2.0 mmHg LVOT Vmax:         82.20 cm/s LVOT Vmean:        47.000 cm/s LVOT VTI:          0.126 m LVOT/AV VTI ratio: 0.92  AORTA Ao Root diam: 3.50 cm MITRAL VALVE MV Area (PHT): 3.42 cm  SHUNTS MV Decel Time: 222 msec    Systemic VTI:  0.13 m MV E velocity: 80.60 cm/s  Systemic Diam: 2.30 cm MV A velocity: 58.30 cm/s  Pulmonic VTI:  0.163 m MV E/A ratio:  1.38 Jaid Quirion D Kaleeah Gingerich MD Electronically signed by Yolonda Kida MD Signature Date/Time: 01/21/2022/3:53:12 PM    Final      Echo normal left ventricular function EF of 60%  TELEMETRY: Sinus tachycardia PVCs   ASSESSMENT AND PLAN:  Principal Problem:   Asthma exacerbation Active Problems:   COPD exacerbation (HCC)   Sepsis ([place])   Tobacco abuse   Acute respiratory failure with hypoxia and hypercapnia (HCC)   Acute on chronic respiratory failure with hypercapnia (HCC) Hyperkalemia  Plan Continue critical care respiratory support Steroid therapy for severe asthma Currently on aminophylline therapy as well Agree with broad-spectrum antibiotic therapy Maintain amiodarone drip for PVCs We will probably avoid beta-blockers since the patient has severe asthma Echocardiogram for assessment of left ventricular function suggest preserved left ventricular function Continue to correct electrolytes Recommend  conservative cardiac input at this stage   Yolonda Kida, MD 01/22/2022 10:06 AM

## 2022-01-22 NOTE — Consult Note (Signed)
PHARMACY CONSULT NOTE  Pharmacy Consult for Electrolyte Monitoring and Replacement   Recent Labs: Potassium (mmol/L)  Date Value  01/22/2022 5.0   Magnesium (mg/dL)  Date Value  40/97/3532 2.2   Calcium (mg/dL)  Date Value  99/24/2683 7.6 (L)   Albumin (g/dL)  Date Value  41/96/2229 3.6   Phosphorus (mg/dL)  Date Value  79/89/2119 3.5   Sodium (mmol/L)  Date Value  01/22/2022 136   Assessment: Patient is a 61 y/o M with medical history including COPD / asthma and tobacco abuse who presented to the ED 2/24 with acute respiratory failure secondary to asthma / COPD exacerbation. Patient ultimately required intubation. Disposition is the ICU. Pharmacy consulted to assist with electrolyte monitoring and replacement as indicated.  Goal of Therapy:  Electrolytes within normal limits  Plan:  --No electrolyte replacement indicated at this time --Follow-up electrolytes with AM labs   Shelbie Franken A 01/22/2022 8:41 AM

## 2022-01-23 ENCOUNTER — Inpatient Hospital Stay: Payer: Self-pay

## 2022-01-23 LAB — GLUCOSE, CAPILLARY
Glucose-Capillary: 124 mg/dL — ABNORMAL HIGH (ref 70–99)
Glucose-Capillary: 125 mg/dL — ABNORMAL HIGH (ref 70–99)
Glucose-Capillary: 127 mg/dL — ABNORMAL HIGH (ref 70–99)
Glucose-Capillary: 132 mg/dL — ABNORMAL HIGH (ref 70–99)
Glucose-Capillary: 147 mg/dL — ABNORMAL HIGH (ref 70–99)
Glucose-Capillary: 149 mg/dL — ABNORMAL HIGH (ref 70–99)
Glucose-Capillary: 156 mg/dL — ABNORMAL HIGH (ref 70–99)

## 2022-01-23 LAB — CBC WITH DIFFERENTIAL/PLATELET
Abs Immature Granulocytes: 0.09 10*3/uL — ABNORMAL HIGH (ref 0.00–0.07)
Basophils Absolute: 0 10*3/uL (ref 0.0–0.1)
Basophils Relative: 0 %
Eosinophils Absolute: 0 10*3/uL (ref 0.0–0.5)
Eosinophils Relative: 0 %
HCT: 42 % (ref 39.0–52.0)
Hemoglobin: 13.6 g/dL (ref 13.0–17.0)
Immature Granulocytes: 1 %
Lymphocytes Relative: 3 %
Lymphs Abs: 0.4 10*3/uL — ABNORMAL LOW (ref 0.7–4.0)
MCH: 30.8 pg (ref 26.0–34.0)
MCHC: 32.4 g/dL (ref 30.0–36.0)
MCV: 95 fL (ref 80.0–100.0)
Monocytes Absolute: 1.4 10*3/uL — ABNORMAL HIGH (ref 0.1–1.0)
Monocytes Relative: 9 %
Neutro Abs: 13.6 10*3/uL — ABNORMAL HIGH (ref 1.7–7.7)
Neutrophils Relative %: 87 %
Platelets: 163 10*3/uL (ref 150–400)
RBC: 4.42 MIL/uL (ref 4.22–5.81)
RDW: 13.6 % (ref 11.5–15.5)
WBC: 15.5 10*3/uL — ABNORMAL HIGH (ref 4.0–10.5)
nRBC: 0 % (ref 0.0–0.2)

## 2022-01-23 LAB — BLOOD GAS, ARTERIAL
Acid-Base Excess: 2 mmol/L (ref 0.0–2.0)
Bicarbonate: 29.4 mmol/L — ABNORMAL HIGH (ref 20.0–28.0)
FIO2: 0.4 %
MECHVT: 480 mL
Mechanical Rate: 28
O2 Saturation: 93.9 %
PEEP: 8 cmH2O
Patient temperature: 37
RATE: 24 resp/min
pCO2 arterial: 57 mmHg — ABNORMAL HIGH (ref 32–48)
pH, Arterial: 7.32 — ABNORMAL LOW (ref 7.35–7.45)
pO2, Arterial: 59 mmHg — ABNORMAL LOW (ref 83–108)

## 2022-01-23 LAB — BASIC METABOLIC PANEL
Anion gap: 8 (ref 5–15)
BUN: 43 mg/dL — ABNORMAL HIGH (ref 6–20)
CO2: 33 mmol/L — ABNORMAL HIGH (ref 22–32)
Calcium: 8.3 mg/dL — ABNORMAL LOW (ref 8.9–10.3)
Chloride: 92 mmol/L — ABNORMAL LOW (ref 98–111)
Creatinine, Ser: 1.75 mg/dL — ABNORMAL HIGH (ref 0.61–1.24)
GFR, Estimated: 44 mL/min — ABNORMAL LOW (ref >60)
Glucose, Bld: 140 mg/dL — ABNORMAL HIGH (ref 70–99)
Potassium: 4.7 mmol/L (ref 3.5–5.1)
Sodium: 133 mmol/L — ABNORMAL LOW (ref 135–145)

## 2022-01-23 LAB — PROCALCITONIN: Procalcitonin: <0.1 ng/mL

## 2022-01-23 LAB — PHOSPHORUS: Phosphorus: 3.9 mg/dL (ref 2.5–4.6)

## 2022-01-23 LAB — MAGNESIUM: Magnesium: 2.4 mg/dL (ref 1.7–2.4)

## 2022-01-23 MED ORDER — SODIUM CHLORIDE 0.9 % IV BOLUS
1000.0000 mL | Freq: Once | INTRAVENOUS | Status: AC
  Administered 2022-01-23: 1000 mL via INTRAVENOUS

## 2022-01-23 MED ORDER — ENOXAPARIN SODIUM 60 MG/0.6ML IJ SOSY
0.5000 mg/kg | PREFILLED_SYRINGE | INTRAMUSCULAR | Status: DC
  Administered 2022-01-24 – 2022-01-25 (×2): 55 mg via SUBCUTANEOUS
  Filled 2022-01-23 (×3): qty 0.55

## 2022-01-23 NOTE — Progress Notes (Incomplete)
[facility] Cardiology    SUBJECTIVE: ***   Vitals:   01/23/22 1800 01/23/22 1900 01/23/22 2000 01/23/22 2100  BP: (!) 127/57 113/62 120/63 (!) 113/57  Pulse: 89 89 93 85  Resp: (!) 24 (!) 24 (!) 24 (!) 24  Temp: 98.2 F (36.8 C) 98.4 F (36.9 C) 98.4 F (36.9 C) 98.2 F (36.8 C)  TempSrc: Bladder Bladder    SpO2: 94% 94% 96% 98%  Weight:      Height:         Intake/Output Summary (Last 24 hours) at 01/23/2022 2358 Last data filed at 01/23/2022 2100 Gross per 24 hour  Intake 3886.06 ml  Output 5350 ml  Net -1463.94 ml      PHYSICAL EXAM  General: Well developed, well nourished, in no acute distress HEENT:  Normocephalic and atramatic Neck:  No JVD.  Lungs: Clear bilaterally to auscultation and percussion. Heart: HRRR . Normal S1 and S2 without gallops or murmurs.  Abdomen: Bowel sounds are positive, abdomen soft and non-tender  Msk:  Back normal, normal gait. Normal strength and tone for age. Extremities: No clubbing, cyanosis or edema.   Neuro: Alert and oriented X 3. Psych:  Good affect, responds appropriately   LABS: Basic Metabolic Panel: Recent Labs    01/22/22 0358 01/23/22 0427  NA 136 133*  K 5.0 4.7  CL 96* 92*  CO2 32 33*  GLUCOSE 163* 140*  BUN 27* 43*  CREATININE 1.16 1.75*  CALCIUM 7.6* 8.3*  MG 2.2 2.4  PHOS 3.5 3.9   Liver Function Tests: Recent Labs    01/21/22 1130 01/22/22 0358  AST  --  26  ALT  --  33  ALKPHOS  --  40  BILITOT  --  0.6  PROT  --  6.7  ALBUMIN 4.1 3.6   No results for input(s): LIPASE, AMYLASE in the last 72 hours. CBC: Recent Labs    01/21/22 0544 01/22/22 0358 01/23/22 0427  WBC 8.4 13.9* 15.5*  NEUTROABS 6.4  --  13.6*  HGB 15.9 14.3 13.6  HCT 50.9 45.5 42.0  MCV 96.8 97.0 95.0  PLT 191 224 163   Cardiac Enzymes: No results for input(s): CKTOTAL, CKMB, CKMBINDEX, TROPONINI in the last 72 hours. BNP: Invalid input(s): POCBNP D-Dimer: No results for input(s): DDIMER in the last 72  hours. Hemoglobin A1C: Recent Labs    01/21/22 1436  HGBA1C 4.9   Fasting Lipid Panel: Recent Labs    01/22/22 0358  TRIG 93   Thyroid Function Tests: No results for input(s): TSH, T4TOTAL, T3FREE, THYROIDAB in the last 72 hours.  Invalid input(s): FREET3 Anemia Panel: No results for input(s): VITAMINB12, FOLATE, FERRITIN, TIBC, IRON, RETICCTPCT in the last 72 hours.  DG Chest Port 1 View  Result Date: 01/23/2022 CLINICAL DATA:  Ventilator dependent respiratory failure. EXAM: PORTABLE CHEST 1 VIEW COMPARISON:  Portable chest yesterday at 3:46 p.m. FINDINGS: 5:14 a.m., 01/23/2022. Left IJ central line remains in the upper SVC. ETT tip is 3.7 cm from the carina. NGT is partially coiled in the stomach. There is mild cardiomegaly, normal caliber central vessels. Stable mediastinum with aortic atherosclerosis. There is increased retrocardiac/infrahilar left lower lobe opacity which could be due to atelectasis, pneumonia or aspiration. Bronchial thickening is also increased centrally. No pleural effusion is seen.  There is thoracic spondylosis. IMPRESSION: Increased left lower lobe opacity consistent with atelectasis, pneumonia or aspiration, with increased findings of central bronchitis. Remaining lung fields are clear. Electronically Signed   By: Mellody Dance  Chesser M.D.   On: 01/23/2022 07:11   DG Chest Port 1 View  Result Date: 01/22/2022 CLINICAL DATA:  Replacement of endotracheal tube EXAM: PORTABLE CHEST 1 VIEW COMPARISON:  Previous studies including the examination done earlier today FINDINGS: Tip of endotracheal tube is approximately 5.6 cm above the carina. Tip of enteric tube is seen in the fundus of the stomach. Transverse diameter of heart is in the upper limits of normal. There are no signs of pulmonary edema or focal pulmonary consolidation. There is no pleural effusion. Apices are not included in their entirety limiting evaluation for minimal pneumothorax. IMPRESSION: There are no new  infiltrates or signs of pulmonary edema. Electronically Signed   By: Ernie Avena M.D.   On: 01/22/2022 16:15   DG Chest Port 1 View  Result Date: 01/22/2022 CLINICAL DATA:  Follow-up for respiratory failure. EXAM: PORTABLE CHEST 1 VIEW COMPARISON:  01/21/2022 and older studies. FINDINGS: Cardiac silhouette normal in size. Clear lungs.  No pneumothorax. Endotracheal tube, left internal jugular central venous line and nasal/orogastric tube are stable. IMPRESSION: 1. No acute cardiopulmonary disease. 2. No change from the previous day's study. Electronically Signed   By: Amie Portland M.D.   On: 01/22/2022 09:24     Echo ***  TELEMETRY: ***:  ASSESSMENT AND PLAN:  Principal Problem:   Asthma exacerbation Active Problems:   COPD exacerbation (HCC)   Sepsis (HCC)   Tobacco abuse   Acute respiratory failure with hypoxia and hypercapnia (HCC)   Acute on chronic respiratory failure with hypercapnia (HCC)    1. ***   Alwyn Pea, MD, 01/23/2022 11:58 PM

## 2022-01-23 NOTE — Plan of Care (Signed)
°  Problem: Education: Goal: Knowledge of disease or condition will improve Outcome: Not Progressing Goal: Knowledge of the prescribed therapeutic regimen will improve Outcome: Not Progressing Goal: Individualized Educational Video(s) Outcome: Not Progressing   Problem: Activity: Goal: Ability to tolerate increased activity will improve Outcome: Not Progressing Goal: Will verbalize the importance of balancing activity with adequate rest periods Outcome: Not Progressing   Problem: Respiratory: Goal: Ability to maintain a clear airway will improve Outcome: Not Progressing Goal: Levels of oxygenation will improve Outcome: Not Progressing Goal: Ability to maintain adequate ventilation will improve Outcome: Not Progressing   Problem: Education: Goal: Knowledge of General Education information will improve Description: Including pain rating scale, medication(s)/side effects and non-pharmacologic comfort measures Outcome: Not Progressing   Problem: Health Behavior/Discharge Planning: Goal: Ability to manage health-related needs will improve Outcome: Not Progressing   Problem: Clinical Measurements: Goal: Ability to maintain clinical measurements within normal limits will improve Outcome: Not Progressing Goal: Will remain free from infection Outcome: Not Progressing Goal: Diagnostic test results will improve Outcome: Not Progressing Goal: Respiratory complications will improve Outcome: Not Progressing Goal: Cardiovascular complication will be avoided Outcome: Not Progressing   Problem: Clinical Measurements: Goal: Will remain free from infection Outcome: Not Progressing   Problem: Clinical Measurements: Goal: Diagnostic test results will improve Outcome: Not Progressing   Problem: Clinical Measurements: Goal: Respiratory complications will improve Outcome: Not Progressing   Problem: Clinical Measurements: Goal: Cardiovascular complication will be avoided Outcome: Not  Progressing   Problem: Activity: Goal: Risk for activity intolerance will decrease Outcome: Not Progressing

## 2022-01-23 NOTE — Progress Notes (Signed)
Anticoagulation monitoring(Lovenox):  60yo  M ordered Lovenox 40 mg Q24h    Filed Weights   01/21/22 1017 01/22/22 0412 01/23/22 0500  Weight: 107.2 kg (236 lb 5.3 oz) 114.5 kg (252 lb 6.8 oz) 112.4 kg (247 lb 12.8 oz)   BMI 33.6   Lab Results  Component Value Date   CREATININE 1.75 (H) 01/23/2022   CREATININE 1.16 01/22/2022   CREATININE 0.90 01/21/2022   Estimated Creatinine Clearance: 58.8 mL/min (A) (by C-G formula based on SCr of 1.75 mg/dL (H)). Hemoglobin & Hematocrit     Component Value Date/Time   HGB 13.6 01/23/2022 0427   HGB 15.3 03/19/2015 1020   HCT 42.0 01/23/2022 0427   HCT 46.7 03/19/2015 1020     Per Protocol for Patient with estCrcl > 30 ml/min and BMI >30, will transition to Lovenox 0.5 mg/kg Q24h      Chinita Greenland PharmD Clinical Pharmacist 01/23/2022

## 2022-01-23 NOTE — Consult Note (Signed)
PHARMACY CONSULT NOTE  Pharmacy Consult for Electrolyte Monitoring and Replacement   Recent Labs: Potassium (mmol/L)  Date Value  01/23/2022 4.7   Magnesium (mg/dL)  Date Value  65/99/3570 2.4   Calcium (mg/dL)  Date Value  17/79/3903 8.3 (L)   Albumin (g/dL)  Date Value  00/92/3300 3.6   Phosphorus (mg/dL)  Date Value  76/22/6333 3.9   Sodium (mmol/L)  Date Value  01/23/2022 133 (L)   Assessment: Patient is a 61 y/o M with medical history including COPD / asthma and tobacco abuse who presented to the ED 2/24 with acute respiratory failure secondary to asthma / COPD exacerbation. Patient ultimately required intubation. Disposition is the ICU. Pharmacy consulted to assist with electrolyte monitoring and replacement as indicated.  Goal of Therapy:  Electrolytes within normal limits  Plan:  --No electrolyte replacement indicated at this time --Follow-up electrolytes with AM labs   Kenneth Benson A 01/23/2022 10:29 AM

## 2022-01-23 NOTE — Progress Notes (Signed)
Urine output minimal at the start of shift. Bladder scan residual urine=1500 ml. Catheter flushed. 1500 urine output obtained

## 2022-01-23 NOTE — Progress Notes (Signed)
NAME:  Kenneth Benson, MRN:  [id number], DOB:  [birth year], LOS: 2 ADMISSION DATE:  01/21/2022, CONSULTATION DATE: 01/21/2022 REFERRING MD: Dr. Cheri Fowler, CHIEF COMPLAINT: Shortness of Breath    History of Present Illness:  This is a 61 yo male who presented to [facility] ER via EMS on 02/24 with c/o shortness of breath, cough, wheezing, and congestion onset of symptoms several days prior to presentation. He reported using his inhaler without relief of symptoms.  EMS reported pt severely hypoxic with O2 sats in the 60's.  He received duoneb's x2 and 125 mg of iv solumedrol en route to the ER.  All hx obtained from chart review pt currently sedated and mechanically intubated with no family at bedside.   ED Course Upon arrival to the ER pt noted to be in moderate respiratory distress with diffuse wheezes per ED provider notes.  CXR, COVID-19/Influenza A&B, and pct negative.  VBG revealed pH 7.17/pCO2 99/bicarb 36.1.  EKG revealed sinus tachycardia hr 140 without ST elevation/depression and troponin 9.  He received duoneb x3 and 2 g of iv magnesium.  He was subsequently placed on Bipap with initial improvement of respiratory status.  However, respiratory status rapidly declined and pt required mechanical intubation.  He received an additional 2 g of iv magnesium and duoneb post intubation.  PCCM team contacted for ICU admission.  Pertinent  Medical History  COPD Current Smoker  Asthma   Significant Hospital Events: Including procedures, antibiotic start and stop dates in addition to other pertinent events   02/24: Pt admitted to ICU with acute on chronic hypercapnic hypoxic respiratory failure secondary to asthma and COPD exacerbation requiring mechanical intubation  02/25: Remains intubated, vent changes allowed  Interim History / Subjective:  Patient remains heavily sedated due to status asthmaticus.  Cannot perform wake up assessment due to need for paralytics and heavy sedation for adequate  ventilation.  Peak airway pressures down to the 48 range today.    Objective   Blood pressure 119/70, pulse 92, temperature 98.6 F (37 C), temperature source Bladder, resp. rate (!) 28, height 6' 0.01" (1.829 m), weight 112.4 kg, SpO2 95 %. CVP:  [7 mmHg-18 mmHg] 9 mmHg  Vent Mode: PCV FiO2 (%):  [40 %] 40 % Set Rate:  [28 bmp] 28 bmp PEEP:  [8 cmH20-10 cmH20] 8 cmH20 Plateau Pressure:  [25 cmH20] 25 cmH20   During rounds was able to switch patient to PRVC: Vent Mode: PRVC FiO2 (%):  [40 %-45 %] 45 % Set Rate:  [24 bmp-28 bmp] 24 bmp Vt Set:  [480 mL] 480 mL PEEP:  [8 cmH20-10 cmH20] 8 cmH20 Plateau Pressure:  [25 cmH20] 25 cmH20   Intake/Output Summary (Last 24 hours) at 01/23/2022 7989 Last data filed at 01/23/2022 0800 Gross per 24 hour  Intake 4632.75 ml  Output 975 ml  Net 3657.75 ml    Filed Weights   01/21/22 1017 01/22/22 0412 01/23/22 0500  Weight: 107.2 kg 114.5 kg 112.4 kg    Examination: General: Critically ill appearing male, heavily sedated and mechanically ventilated HEENT: supple, JVD present, trachea midline, no crepitus Lungs: diffuse rhonchi, moving air better, wheezing greatly diminished, synchronous with the vent Cardiovascular: Sinus with frequent PVC's, no R/G, 2+ radial/2+ distal pulses, no edema  Abdomen: +BS x4, obese, soft, non tender, non distended  Extremities: normal bulk and tone, no edema  Neuro: Heavily sedated, on paralytics due to severe respiratory failure  GU: indwelling foley draining clear yellow urine   Resolved Hospital  Problem list   N/A  Assessment & Plan:  Acute on chronic hypoxic hypercapnic respiratory failure secondary to asthma and COPD exacerbation  Status asthmaticus Respiratory acidosis, severe due to the above Full vent support for now: vent settings reviewed with RT Switched to [facility] today SBT once all parameters met  Continue aminophylline gtt: monitor theophylline levels, level from 2/24 pending IV steroids:  wean as tolerated, currently on 60 mg IV twice daily Continue scheduled and prn bronchodilator therapy  Respiratory panel positive for rhinovirus Follow respiratory culture Trend WBC and monitor fever curve  Procalcitonin less than 0.10, withhold antibiotics for now Discontinued Azithromycin as on amiodarone to prevent QT prolongation PAD protocol: maintain RASS goal - 3 for now  Nimbex, propofol, and fentanyl gtts to maintain RASS goal  Nimbex weaned off today WUA once pt stabilized  Continue bowel regimen  Once extubated will need smoking cessation counseling   Acute kidney injury likely due to ATN from hypoxia Mild hyponatremia, hypochloremia Volume challenge Avoid nephrotoxins as able Follow renal panel BUN 43 creatinine 1.75 Currently no need for dialysis nor renal consultation  Ventricular trigeminy with frequent PVC's Continuous telemetry monitoring  On amiodarone infusion Replace electrolytes as indicated  Echo: LVEF 60 to 65%, mild LV dilatation increase right ventricular volume overload Troponins normal  Hyperkalemia - corrected Due to respiratory acidosis Ventilator changes made Patient required bicarb infusion Bicarb now off  Best Practice (right click and "Reselect all SmartList Selections" daily)   Diet/type: NPO initiate tube feeds in a.m. DVT prophylaxis: LMWH GI prophylaxis: PPI Lines: Central line and yes and it is still needed Foley:  Yes, and it is still needed Code Status:  full code Last date of multidisciplinary goals of care discussion [01/21/2022]  Patient's significant other is Forestine Na no other family known Labs   CBC: Recent Labs  Lab 01/21/22 0544 01/22/22 0358 01/23/22 0427  WBC 8.4 13.9* 15.5*  NEUTROABS 6.4  --  13.6*  HGB 15.9 14.3 13.6  HCT 50.9 45.5 42.0  MCV 96.8 97.0 95.0  PLT 191 224 163     Basic Metabolic Panel: Recent Labs  Lab 01/21/22 0544 01/21/22 1021 01/21/22 1130 01/21/22 2118 01/22/22 0358  01/23/22 0427  NA 143  --  139  --  136 133*  K 4.1  --  5.6* 5.3* 5.0 4.7  CL 102  --  101  --  96* 92*  CO2 34*  --  33*  --  32 33*  GLUCOSE 129*  --  167*  --  163* 140*  BUN 16  --  18  --  27* 43*  CREATININE 0.62  --  0.90  --  1.16 1.75*  CALCIUM 8.4*  --  7.8*  --  7.6* 8.3*  MG  --  2.6* 2.6* 2.4 2.2 2.4  PHOS  --   --  5.5*  --  3.5 3.9    GFR: Estimated Creatinine Clearance: 58.8 mL/min (A) (by C-G formula based on SCr of 1.75 mg/dL (H)). Recent Labs  Lab 01/21/22 0544 01/21/22 0854 01/21/22 1021 01/22/22 0358 01/23/22 0427  PROCALCITON  --  <0.10  --  <0.10 <0.10  WBC 8.4  --   --  13.9* 15.5*  LATICACIDVEN  --  1.0 1.1  --   --      Liver Function Tests: Recent Labs  Lab 01/21/22 1130 01/22/22 0358  AST  --  26  ALT  --  33  ALKPHOS  --  40  BILITOT  --  0.6  PROT  --  6.7  ALBUMIN 4.1 3.6    No results for input(s): LIPASE, AMYLASE in the last 168 hours. No results for input(s): AMMONIA in the last 168 hours.  ABG    Component Value Date/Time   PHART 7.32 (L) 01/23/2022 0428   PCO2ART 67 (HH) 01/23/2022 0428   PO2ART 70 (L) 01/23/2022 0428   HCO3 34.5 (H) 01/23/2022 0428   O2SAT 95.4 01/23/2022 0428      Coagulation Profile: No results for input(s): INR, PROTIME in the last 168 hours.  Cardiac Enzymes: No results for input(s): CKTOTAL, CKMB, CKMBINDEX, TROPONINI in the last 168 hours.  HbA1C: Hgb A1c MFr Bld  Date/Time Value Ref Range Status  01/21/2022 02:36 PM 4.9 4.8 - 5.6 % Final    Comment:    (NOTE) Pre diabetes:          5.7%-6.4%  Diabetes:              >6.4%  Glycemic control for   <7.0% adults with diabetes     CBG: Recent Labs  Lab 01/22/22 1554 01/22/22 1939 01/23/22 0007 01/23/22 0408 01/23/22 0746  GLUCAP 146* 128* 125* 132* 149*     Review of Systems:   Unable to assess pt mechanically ventilated  Allergies No Known Allergies   Scheduled Meds:  artificial tears  1 application Both Eyes G0F    chlorhexidine gluconate (MEDLINE KIT)  15 mL Mouth Rinse BID   Chlorhexidine Gluconate Cloth  6 each Topical Q0600   cisatracurium  10 mg Intravenous Once   docusate  100 mg Per Tube BID   enoxaparin (LOVENOX) injection  40 mg Subcutaneous Q24H   insulin aspart  0-15 Units Subcutaneous Q4H   ipratropium-albuterol  3 mL Nebulization Q4H   mouth rinse  15 mL Mouth Rinse 10 times per day   methylPREDNISolone (SOLU-MEDROL) injection  60 mg Intravenous Q12H   pantoprazole (PROTONIX) IV  40 mg Intravenous Q24H   polyethylene glycol  17 g Per Tube Daily   Continuous Infusions:  sodium chloride 10 mL/hr at 01/23/22 0800   aminophylline 1 mg/mL infusion 0.7 mg/kg/hr (01/23/22 0800)   amiodarone 30 mg/hr (01/23/22 0800)   cisatracurium (NIMBEX) infusion 2 mg/mL 2 mcg/kg/min (01/23/22 0800)   fentaNYL infusion INTRAVENOUS 350 mcg/hr (01/23/22 0800)   phenylephrine (NEO-SYNEPHRINE) Adult infusion 10 mcg/min (01/23/22 0800)   propofol (DIPRIVAN) infusion 55 mcg/kg/min (01/23/22 0800)   vasopressin Stopped (01/21/22 1740)   PRN Meds:.acetaminophen, albuterol, docusate sodium, fentaNYL (SUBLIMAZE) injection, fentaNYL (SUBLIMAZE) injection, ondansetron (ZOFRAN) IV, polyethylene glycol   Critical care time: 45 minutes     The patient is critically ill with multiple organ systems failure and requires high complexity decision making for assessment and support, frequent evaluation and titration of therapies, application of advanced monitoring technologies and extensive interpretation of multiple databases.  Patient has required frequent ventilator adjustments during rounds.   Renold Don, MD Advanced Bronchoscopy PCCM [facility]    *This note was dictated using voice recognition software/Dragon.  Despite best efforts to proofread, errors can occur which can change the meaning. Any transcriptional errors that result from this process are unintentional and may not be fully  corrected at the time of dictation.

## 2022-01-23 NOTE — Progress Notes (Signed)
Have been able to titrate Neo down to 10 mcg/min, RASS has stayed at -4 to -5 all night per order, tolerating vent well with no attempts to breath over the vent.

## 2022-01-24 DIAGNOSIS — J441 Chronic obstructive pulmonary disease with (acute) exacerbation: Secondary | ICD-10-CM

## 2022-01-24 LAB — GLUCOSE, CAPILLARY
Glucose-Capillary: 144 mg/dL — ABNORMAL HIGH (ref 70–99)
Glucose-Capillary: 165 mg/dL — ABNORMAL HIGH (ref 70–99)
Glucose-Capillary: 145 mg/dL — ABNORMAL HIGH (ref 70–99)
Glucose-Capillary: 131 mg/dL — ABNORMAL HIGH (ref 70–99)
Glucose-Capillary: 163 mg/dL — ABNORMAL HIGH (ref 70–99)
Glucose-Capillary: 119 mg/dL — ABNORMAL HIGH (ref 70–99)

## 2022-01-24 LAB — CULTURE, RESPIRATORY W GRAM STAIN: Culture: NORMAL

## 2022-01-24 LAB — CBC
HCT: 39.3 % (ref 39.0–52.0)
Hemoglobin: 12.4 g/dL — ABNORMAL LOW (ref 13.0–17.0)
MCH: 30.2 pg (ref 26.0–34.0)
MCHC: 31.6 g/dL (ref 30.0–36.0)
MCV: 95.9 fL (ref 80.0–100.0)
Platelets: 142 10*3/uL — ABNORMAL LOW (ref 150–400)
RBC: 4.1 MIL/uL — ABNORMAL LOW (ref 4.22–5.81)
RDW: 13.6 % (ref 11.5–15.5)
WBC: 10.5 10*3/uL (ref 4.0–10.5)
nRBC: 0 % (ref 0.0–0.2)

## 2022-01-24 LAB — LEGIONELLA PNEUMOPHILA SEROGP 1 UR AG: L. pneumophila Serogp 1 Ur Ag: NEGATIVE

## 2022-01-24 LAB — PHOSPHORUS: Phosphorus: 3 mg/dL (ref 2.5–4.6)

## 2022-01-24 LAB — PROCALCITONIN: Procalcitonin: <0.1 ng/mL

## 2022-01-24 LAB — MAGNESIUM: Magnesium: 2.6 mg/dL — ABNORMAL HIGH (ref 1.7–2.4)

## 2022-01-24 MED ORDER — FREE WATER
100.0000 mL | Status: DC
  Administered 2022-01-24 – 2022-01-25 (×6): 100 mL

## 2022-01-24 MED ORDER — PROSOURCE TF PO LIQD
90.0000 mL | Freq: Four times a day (QID) | ORAL | Status: DC
  Administered 2022-01-24 – 2022-01-27 (×14): 90 mL
  Filled 2022-01-24 (×15): qty 90

## 2022-01-24 MED ORDER — VITAL HIGH PROTEIN PO LIQD
1000.0000 mL | ORAL | Status: DC
  Administered 2022-01-24: 1000 mL

## 2022-01-24 MED ORDER — ADULT MULTIVITAMIN LIQUID CH
15.0000 mL | Freq: Every day | ORAL | Status: DC
  Administered 2022-01-25 – 2022-01-27 (×3): 15 mL
  Filled 2022-01-24 (×3): qty 15

## 2022-01-24 MED ORDER — METHYLPREDNISOLONE SODIUM SUCC 40 MG IJ SOLR
40.0000 mg | Freq: Two times a day (BID) | INTRAMUSCULAR | Status: DC
  Administered 2022-01-24 – 2022-01-26 (×6): 40 mg via INTRAVENOUS
  Filled 2022-01-24 (×6): qty 1

## 2022-01-24 MED ORDER — BUDESONIDE 0.25 MG/2ML IN SUSP
0.2500 mg | Freq: Two times a day (BID) | RESPIRATORY_TRACT | Status: DC
  Administered 2022-01-24 – 2022-02-12 (×40): 0.25 mg via RESPIRATORY_TRACT
  Filled 2022-01-24 (×40): qty 2

## 2022-01-24 NOTE — TOC Progression Note (Signed)
Transition of Care [facility]) - Progression Note    Patient Details  Name: Kenneth Benson MRN: [id number] Date of Birth: 15-Mar-1961  [facility] of [facility]) CM/SW Contact  Allayne Butcher, RN Phone Number: 01/24/2022, 12:51 PM  Clinical Narrative:    Patient remains in the ICU, intubated and sedated.     Expected Discharge Plan:  (TBD) Barriers to Discharge: Continued Medical Work up  Expected Discharge Plan and Services Expected Discharge Plan:  (TBD)                                               Social Determinants of Health (SDOH) Interventions    Readmission Risk Interventions No flowsheet data found.

## 2022-01-24 NOTE — Progress Notes (Signed)
[facility]    SUBJECTIVE: Patient intubated sedated.   Vitals:   01/24/22 0800 01/24/22 1000 01/24/22 1100 01/24/22 1138  BP:  (!) 119/59 (!) 116/56   Pulse:  (!) 52 64   Resp:  (!) 23 (!) 23   Temp: 97.7 F (36.5 C)  (!) 97.5 F (36.4 C)   TempSrc: Bladder  Bladder Bladder  SpO2:  97% 96%   Weight:      Height:         Intake/Output Summary (Last 24 hours) at 01/24/2022 1230 Last data filed at 01/24/2022 1201 Gross per 24 hour  Intake 4179.48 ml  Output 5875 ml  Net -1695.52 ml      PHYSICAL EXAM  General: Well developed, well nourished, in no acute distress HEENT:  Normocephalic and atramatic Neck:  No JVD.  Lungs: Clear bilaterally to auscultation and percussion. Heart: HRRR . Normal S1 and S2 without gallops or murmurs.  Abdomen: Bowel sounds are positive, abdomen soft and non-tender  Msk:  Back normal, normal gait. Normal strength and tone for age. Extremities: No clubbing, cyanosis or edema.   Neuro: Unresponsive intubated sedated Psych: Unresponsive   LABS: Basic Metabolic Panel: Recent Labs    01/23/22 0427 01/24/22 0432  NA 133* 140  K 4.7 5.0  CL 92* 97*  CO2 33* 34*  GLUCOSE 140* 179*  BUN 43* 27*  CREATININE 1.75* 0.92  CALCIUM 8.3* 8.2*  MG 2.4 2.6*  PHOS 3.9 3.0   Liver Function Tests: Recent Labs    01/22/22 0358  AST 26  ALT 33  ALKPHOS 40  BILITOT 0.6  PROT 6.7  ALBUMIN 3.6   No results for input(s): LIPASE, AMYLASE in the last 72 hours. CBC: Recent Labs    01/23/22 0427 01/24/22 0432  WBC 15.5* 10.5  NEUTROABS 13.6*  --   HGB 13.6 12.4*  HCT 42.0 39.3  MCV 95.0 95.9  PLT 163 142*   Cardiac Enzymes: No results for input(s): CKTOTAL, CKMB, CKMBINDEX, TROPONINI in the last 72 hours. BNP: Invalid input(s): POCBNP D-Dimer: No results for input(s): DDIMER in the last 72 hours. Hemoglobin A1C: Recent Labs    01/21/22 1436  HGBA1C 4.9   Fasting Lipid Panel: Recent Labs    01/22/22 0358  TRIG 93    Thyroid Function Tests: No results for input(s): TSH, T4TOTAL, T3FREE, THYROIDAB in the last 72 hours.  Invalid input(s): FREET3 Anemia Panel: No results for input(s): VITAMINB12, FOLATE, FERRITIN, TIBC, IRON, RETICCTPCT in the last 72 hours.  DG Chest Port 1 View  Result Date: 01/23/2022 CLINICAL DATA:  Ventilator dependent respiratory failure. EXAM: PORTABLE CHEST 1 VIEW COMPARISON:  Portable chest yesterday at 3:46 p.m. FINDINGS: 5:14 a.m., 01/23/2022. Left IJ central line remains in the upper SVC. ETT tip is 3.7 cm from the carina. NGT is partially coiled in the stomach. There is mild cardiomegaly, normal caliber central vessels. Stable mediastinum with aortic atherosclerosis. There is increased retrocardiac/infrahilar left lower lobe opacity which could be due to atelectasis, pneumonia or aspiration. Bronchial thickening is also increased centrally. No pleural effusion is seen.  There is thoracic spondylosis. IMPRESSION: Increased left lower lobe opacity consistent with atelectasis, pneumonia or aspiration, with increased findings of central bronchitis. Remaining lung fields are clear. Electronically Signed   By: Almira Bar M.D.   On: 01/23/2022 07:11   DG Chest Port 1 View  Result Date: 01/22/2022 CLINICAL DATA:  Replacement of endotracheal tube EXAM: PORTABLE CHEST 1 VIEW COMPARISON:  Previous studies including  the examination done earlier today FINDINGS: Tip of endotracheal tube is approximately 5.6 cm above the carina. Tip of enteric tube is seen in the fundus of the stomach. Transverse diameter of heart is in the upper limits of normal. There are no signs of pulmonary edema or focal pulmonary consolidation. There is no pleural effusion. Apices are not included in their entirety limiting evaluation for minimal pneumothorax. IMPRESSION: There are no new infiltrates or signs of pulmonary edema. Electronically Signed   By: Ernie Avena M.D.   On: 01/22/2022 16:15     Echo normal  left ventricular function 60 to 65%  TELEMETRY: Normal sinus rhythm multiple PACs PVCs rate of 100:  ASSESSMENT AND PLAN:  Principal Problem:   Asthma exacerbation Active Problems:   COPD exacerbation (HCC)   Sepsis (HCC)   Tobacco abuse   Acute respiratory failure with hypoxia and hypercapnia (HCC)   Acute on chronic respiratory failure with hypercapnia (HCC)    Plan Patient still intubated sedated continue aggressive critical care management Maintain inhalers as necessary for COPD and asthma Continue broad-spectrum antibiotic therapy for sepsis History of tobacco abuse advised patient refrain from smoking Acute respiratory failure hypoxic hypercapnic on the vent continue critical care management     Alwyn Pea, MD 01/24/2022 12:30 PM

## 2022-01-24 NOTE — Plan of Care (Signed)
  Problem: Education: Goal: Knowledge of disease or condition will improve Outcome: Not Progressing Goal: Knowledge of the prescribed therapeutic regimen will improve Outcome: Not Progressing Goal: Individualized Educational Video(s) Outcome: Not Progressing   Problem: Activity: Goal: Ability to tolerate increased activity will improve Outcome: Not Progressing Goal: Will verbalize the importance of balancing activity with adequate rest periods Outcome: Not Progressing   Problem: Respiratory: Goal: Ability to maintain a clear airway will improve Outcome: Not Progressing Goal: Levels of oxygenation will improve Outcome: Not Progressing Goal: Ability to maintain adequate ventilation will improve Outcome: Not Progressing   Problem: Education: Goal: Knowledge of General Education information will improve Description: Including pain rating scale, medication(s)/side effects and non-pharmacologic comfort measures Outcome: Not Progressing   Problem: Health Behavior/Discharge Planning: Goal: Ability to manage health-related needs will improve Outcome: Not Progressing   Problem: Clinical Measurements: Goal: Ability to maintain clinical measurements within normal limits will improve Outcome: Not Progressing Goal: Will remain free from infection Outcome: Not Progressing Goal: Diagnostic test results will improve Outcome: Not Progressing Goal: Respiratory complications will improve Outcome: Not Progressing Goal: Cardiovascular complication will be avoided Outcome: Not Progressing   Problem: Activity: Goal: Risk for activity intolerance will decrease Outcome: Not Progressing   Problem: Nutrition: Goal: Adequate nutrition will be maintained Outcome: Not Progressing   Problem: Coping: Goal: Level of anxiety will decrease Outcome: Not Progressing   Problem: Elimination: Goal: Will not experience complications related to bowel motility Outcome: Not Progressing Goal: Will not  experience complications related to urinary retention Outcome: Not Progressing   Problem: Pain Managment: Goal: General experience of comfort will improve Outcome: Not Progressing   Problem: Safety: Goal: Ability to remain free from injury will improve Outcome: Not Progressing   Problem: Skin Integrity: Goal: Risk for impaired skin integrity will decrease Outcome: Not Progressing   

## 2022-01-24 NOTE — Progress Notes (Signed)
Nutrition Follow Up Note   DOCUMENTATION CODES:   Obesity unspecified  INTERVENTION:   Vital HP @20ml /hr + ProSource TF 5ml QID via tube   Propofol: 39.8 ml/hr- provides 1051kcal/day   Free water flushes 96m q4 hours  Regimen provides 1851kcal/day, 130g/day protein and 1051ml/day of free water   Liquid MVI daily via tube   NUTRITION DIAGNOSIS:   Inadequate oral intake related to inability to eat (pt sedated and ventilated) as evidenced by NPO status.  GOAL:   Provide needs based on ASPEN/SCCM guidelines  MONITOR:   Vent status, Labs, Weight trends, Skin, I & O's  REASON FOR ASSESSMENT:   Ventilator    ASSESSMENT:   61 y/o male with h/o asthma, COPD and currently smoker who is admitted with severe COPD exacerbation.  Pt sedated and ventilated. OGT in place. Tube feeds initiated over the weekend; pt tolerating well. Propofol increased; will adjust tube feeds. No BM noted since admission. Per chart, pt up ~5lbs since admission; pt +5.9L on his I & Os.   Medications reviewed and include: colace, lovenox, insulin, solu-medrol, protonix, miralax, propofol   Labs reviewed: K 5.0 wnl, BUN 27(H), P 3.0 wnl, Mg 2.6(H) Cbgs- 145, 165, 144 x 24 hrs  Patient is currently intubated on ventilator support MV: 12.0 L/min Temp (24hrs), Avg:98 F (36.7 C), Min:97.3 F (36.3 C), Max:98.4 F (36.9 C)  Propofol: 39.8 ml/hr- provides 1051kcal/day   MAP- >43mmHg   UOP- 6,564ml   Diet Order:    Diet Order             Diet NPO time specified  Diet effective now                  EDUCATION NEEDS:   No education needs have been identified at this time  Skin:  Skin Assessment: Reviewed RN Assessment  Last BM:  PTA  Height:   Ht Readings from Last 1 Encounters:  01/22/22 6' 0.01" (1.829 m)    Weight:   Wt Readings from Last 1 Encounters:  01/24/22 109.4 kg    Ideal Body Weight:  80.9 kg  BMI:  Body mass index is 32.7 kg/m.  Estimated Nutritional  Needs:   Kcal:  1179-1500kcal/day  Protein:  >160g/day  Fluid:  2.4-2.7L/day  01/26/22 MS, RD, LDN Please refer to [facility] for RD and/or RD on-call/weekend/after hours pager

## 2022-01-24 NOTE — Consult Note (Signed)
PHARMACY CONSULT NOTE  Pharmacy Consult for Electrolyte Monitoring and Replacement   Recent Labs: Potassium (mmol/L)  Date Value  01/24/2022 5.0   Magnesium (mg/dL)  Date Value  01/24/2022 2.6 (H)   Calcium (mg/dL)  Date Value  01/24/2022 8.2 (L)   Albumin (g/dL)  Date Value  01/22/2022 3.6   Phosphorus (mg/dL)  Date Value  01/24/2022 3.0   Sodium (mmol/L)  Date Value  01/24/2022 140   Assessment: Patient is a 61 y/o M with medical history including COPD / asthma and tobacco abuse who presented to the ED 2/24 with acute respiratory failure secondary to asthma / COPD exacerbation. Patient ultimately required intubation. Disposition is the ICU. Pharmacy consulted to assist with electrolyte monitoring and replacement as indicated.  Nutrition: Vital HP @20ml /hr + ProSource TF 71ml QID via tube  Free water flushes 172ml q4 hours  Goal of Therapy:  Electrolytes within normal limits  Plan:  --No electrolyte replacement indicated at this time --Follow-up electrolytes with AM labs   Dallie Piles 01/24/2022 8:12 AM

## 2022-01-24 NOTE — Progress Notes (Signed)
Call made to update pt's significant other [name] Hostetter via telephone.  Unfortunately she did not answer, and the mailbox is currently full and unable to accept new voice messages.  Will update at able.   Harlon Ditty, AGACNP-BC [facility] Prefer epic messenger for cross cover needs If after hours, please call E-link

## 2022-01-24 NOTE — Progress Notes (Signed)
NAME:  Kenneth Benson, MRN:  [id number], DOB:  [birth year], LOS: 3 ADMISSION DATE:  01/21/2022, CONSULTATION DATE: 01/21/2022 REFERRING MD: Dr. Cheri Fowler, CHIEF COMPLAINT: Shortness of Breath    History of Present Illness:  This is a 61 yo male who presented to [facility] ER via EMS on 02/24 with c/o shortness of breath, cough, wheezing, and congestion onset of symptoms several days prior to presentation. He reported using his inhaler without relief of symptoms.  EMS reported pt severely hypoxic with O2 sats in the 60's.  He received duoneb's x2 and 125 mg of iv solumedrol en route to the ER.  All hx obtained from chart review pt currently sedated and mechanically intubated with no family at bedside.   ED Course Upon arrival to the ER pt noted to be in moderate respiratory distress with diffuse wheezes per ED provider notes.  CXR, COVID-19/Influenza A&B, and pct negative.  VBG revealed pH 7.17/pCO2 99/bicarb 36.1.  EKG revealed sinus tachycardia hr 140 without ST elevation/depression and troponin 9.  He received duoneb x3 and 2 g of iv magnesium.  He was subsequently placed on Bipap with initial improvement of respiratory status.  However, respiratory status rapidly declined and pt required mechanical intubation.  He received an additional 2 g of iv magnesium and duoneb post intubation.  PCCM team contacted for ICU admission.  Pertinent  Medical History  COPD Current Smoker  Asthma   Significant Hospital Events: Including procedures, antibiotic start and stop dates in addition to other pertinent events   02/24: Pt admitted to ICU with acute on chronic hypercapnic hypoxic respiratory failure secondary to asthma and COPD exacerbation requiring mechanical intubation  02/25: Remains intubated, vent changes allowed 02/27: Remains intubated, slowly weaning vent support. Respiratory culture with normal respiratory flora  Interim History / Subjective:  -No significant events noted overnight -Afebrile,  hemodynamically stable, requiring 15 mcg Neo, on Amiodarone gtt -On PRVC mode, Peak pressures currently 35 -Still with inspiratory and expiratory wheezing on exam -Leukocytosis resolved (previously 15.5) ~ Tracheal aspirate with normal respiratory flora  Objective   Blood pressure (!) 109/58, pulse (!) 104, temperature 97.7 F (36.5 C), resp. rate (!) 24, height 6' 0.01" (1.829 m), weight 109.4 kg, SpO2 96 %. CVP:  [8 mmHg-19 mmHg] 14 mmHg  Vent Mode: PRVC FiO2 (%):  [40 %-45 %] 45 % Set Rate:  [24 bmp] 24 bmp Vt Set:  [480 mL] 480 mL PEEP:  [8 cmH20] 8 cmH20 Plateau Pressure:  [26 cmH20] 26 cmH20   During rounds was able to switch patient to PRVC: Vent Mode: PRVC FiO2 (%):  [40 %-45 %] 45 % Set Rate:  [24 bmp] 24 bmp Vt Set:  [480 mL] 480 mL PEEP:  [8 cmH20] 8 cmH20 Plateau Pressure:  [26 cmH20] 26 cmH20   Intake/Output Summary (Last 24 hours) at 01/24/2022 0742 Last data filed at 01/24/2022 2585 Gross per 24 hour  Intake 3099.17 ml  Output 6575 ml  Net -3475.83 ml    Filed Weights   01/22/22 0412 01/23/22 0500 01/24/22 0500  Weight: 114.5 kg 112.4 kg 109.4 kg    Examination: General: Critically ill appearing male, sedated and mechanically ventilated, in NAD HEENT: supple, JVD present, trachea midline, no crepitus Lungs: diffuse rhonchi with inspiratory and expiratory wheezing, even synchronous with the vent Cardiovascular: Sinus with frequent PVC's, no R/G, 2+ radial/2+ distal pulses, no edema  Abdomen: +BS x4, obese, soft, non tender, non distended  Extremities: normal bulk and tone, no edema  Neuro: Heavily sedated due to severe respiratory failure, Pupils PERRL GU: indwelling foley draining clear yellow urine   Resolved Hospital Problem list   N/A  Assessment & Plan:   Acute on chronic hypoxic hypercapnic respiratory failure secondary to asthma and COPD exacerbation  Status asthmaticus Respiratory acidosis, severe due to the above Full vent support for now:  vent settings reviewed with RT Continues on PRVC today SBT once all parameters met  Continue aminophylline gtt: monitor theophylline levels, level from 2/24 pending IV steroids: wean as tolerated, currently on 60 mg IV twice daily Continue scheduled and prn bronchodilator therapy  Respiratory panel positive for rhinovirus Respiratory culture with normal respiratory flora Trend WBC and monitor fever curve  Procalcitonin less than 0.10, withhold antibiotics for now Discontinued Azithromycin as on amiodarone to prevent QT prolongation PAD protocol: maintain RASS goal - 3 for now  Propofol, and fentanyl gtts to maintain RASS goal  Nimbex weaned off 2/26 WUA once pt stabilized  Continue bowel regimen  Once extubated will need smoking cessation counseling   Acute kidney injury likely due to ATN from hypoxia ~ RESOLVED Mild hyponatremia, hypochloremia ~ RESOLVED Hyperkalemia ~ RESOLVED Monitor I&O's / urinary output Follow BMP Ensure adequate renal perfusion Avoid nephrotoxic agents as able Replace electrolytes as indicated  Ventricular trigeminy with frequent PVC's Continuous telemetry monitoring  Continue amiodarone infusion Replace electrolytes as indicated  Echo: LVEF 60 to 65%, mild LV dilatation increase right ventricular volume overload Troponins normal    Best Practice (right click and "Reselect all SmartList Selections" daily)   Diet/type: NPO, tube feeds DVT prophylaxis: LMWH GI prophylaxis: PPI Lines: Central line and yes and it is still needed Foley:  Yes, and it is still needed Code Status:  full code Last date of multidisciplinary goals of care discussion [01/24/2022]  Patient's significant other is Forestine Na no other family known  Labs   CBC: Recent Labs  Lab 01/21/22 0544 01/22/22 0358 01/23/22 0427 01/24/22 0432  WBC 8.4 13.9* 15.5* 10.5  NEUTROABS 6.4  --  13.6*  --   HGB 15.9 14.3 13.6 12.4*  HCT 50.9 45.5 42.0 39.3  MCV 96.8 97.0 95.0  95.9  PLT 191 224 163 142*     Basic Metabolic Panel: Recent Labs  Lab 01/21/22 0544 01/21/22 1021 01/21/22 1130 01/21/22 2118 01/22/22 0358 01/23/22 0427 01/24/22 0432  NA 143  --  139  --  136 133* 140  K 4.1  --  5.6* 5.3* 5.0 4.7 5.0  CL 102  --  101  --  96* 92* 97*  CO2 34*  --  33*  --  32 33* 34*  GLUCOSE 129*  --  167*  --  163* 140* 179*  BUN 16  --  18  --  27* 43* 27*  CREATININE 0.62  --  0.90  --  1.16 1.75* 0.92  CALCIUM 8.4*  --  7.8*  --  7.6* 8.3* 8.2*  MG  --    < > 2.6* 2.4 2.2 2.4 2.6*  PHOS  --   --  5.5*  --  3.5 3.9 3.0   < > = values in this interval not displayed.    GFR: Estimated Creatinine Clearance: 110.4 mL/min (by C-G formula based on SCr of 0.92 mg/dL). Recent Labs  Lab 01/21/22 0544 01/21/22 0854 01/21/22 1021 01/22/22 0358 01/23/22 0427 01/24/22 0432  PROCALCITON  --  <0.10  --  <0.10 <0.10  --   WBC 8.4  --   --  13.9* 15.5* 10.5  LATICACIDVEN  --  1.0 1.1  --   --   --      Liver Function Tests: Recent Labs  Lab 01/21/22 1130 01/22/22 0358  AST  --  26  ALT  --  33  ALKPHOS  --  40  BILITOT  --  0.6  PROT  --  6.7  ALBUMIN 4.1 3.6    No results for input(s): LIPASE, AMYLASE in the last 168 hours. No results for input(s): AMMONIA in the last 168 hours.  ABG    Component Value Date/Time   PHART 7.32 (L) 01/23/2022 1010   PCO2ART 57 (H) 01/23/2022 1010   PO2ART 59 (L) 01/23/2022 1010   HCO3 29.4 (H) 01/23/2022 1010   O2SAT 93.9 01/23/2022 1010      Coagulation Profile: No results for input(s): INR, PROTIME in the last 168 hours.  Cardiac Enzymes: No results for input(s): CKTOTAL, CKMB, CKMBINDEX, TROPONINI in the last 168 hours.  HbA1C: Hgb A1c MFr Bld  Date/Time Value Ref Range Status  01/21/2022 02:36 PM 4.9 4.8 - 5.6 % Final    Comment:    (NOTE) Pre diabetes:          5.7%-6.4%  Diabetes:              >6.4%  Glycemic control for   <7.0% adults with diabetes     CBG: Recent Labs  Lab  01/23/22 1138 01/23/22 1544 01/23/22 1955 01/23/22 2319 01/24/22 0416  GLUCAP 127* 124* 156* 147* 144*     Review of Systems:   Unable to assess pt mechanically ventilated  Allergies No Known Allergies   Scheduled Meds:  chlorhexidine gluconate (MEDLINE KIT)  15 mL Mouth Rinse BID   Chlorhexidine Gluconate Cloth  6 each Topical Q0600   docusate  100 mg Per Tube BID   enoxaparin (LOVENOX) injection  0.5 mg/kg Subcutaneous Q24H   insulin aspart  0-15 Units Subcutaneous Q4H   ipratropium-albuterol  3 mL Nebulization Q4H   mouth rinse  15 mL Mouth Rinse 10 times per day   methylPREDNISolone (SOLU-MEDROL) injection  60 mg Intravenous Q12H   pantoprazole (PROTONIX) IV  40 mg Intravenous Q24H   polyethylene glycol  17 g Per Tube Daily   Continuous Infusions:  sodium chloride 10 mL/hr at 01/23/22 1900   aminophylline 1 mg/mL infusion 0.7 mg/kg/hr (01/24/22 0035)   amiodarone 30 mg/hr (01/24/22 6579)   fentaNYL infusion INTRAVENOUS 350 mcg/hr (01/24/22 0254)   phenylephrine (NEO-SYNEPHRINE) Adult infusion 15 mcg/min (01/24/22 0036)   propofol (DIPRIVAN) infusion 65 mcg/kg/min (01/24/22 0616)   vasopressin Stopped (01/21/22 1740)   PRN Meds:.acetaminophen, albuterol, docusate sodium, fentaNYL (SUBLIMAZE) injection, fentaNYL (SUBLIMAZE) injection, ondansetron (ZOFRAN) IV, polyethylene glycol   Critical care time: 45 minutes     The patient is critically ill with multiple organ systems failure and requires high complexity decision making for assessment and support, frequent evaluation and titration of therapies, application of advanced monitoring technologies and extensive interpretation of multiple databases.  Patient has required frequent ventilator adjustments during rounds.   Darel Hong, AGACNP-BC [facility] Prefer epic messenger for cross cover needs If after hours, please call E-link

## 2022-01-25 ENCOUNTER — Inpatient Hospital Stay: Payer: Self-pay

## 2022-01-25 LAB — BASIC METABOLIC PANEL
Anion gap: 9 (ref 5–15)
Anion gap: 4 — ABNORMAL LOW (ref 5–15)
BUN: 27 mg/dL — ABNORMAL HIGH (ref 6–20)
BUN: 30 mg/dL — ABNORMAL HIGH (ref 6–20)
CO2: 34 mmol/L — ABNORMAL HIGH (ref 22–32)
CO2: 37 mmol/L — ABNORMAL HIGH (ref 22–32)
Calcium: 8.2 mg/dL — ABNORMAL LOW (ref 8.9–10.3)
Calcium: 8.3 mg/dL — ABNORMAL LOW (ref 8.9–10.3)
Chloride: 97 mmol/L — ABNORMAL LOW (ref 98–111)
Chloride: 99 mmol/L (ref 98–111)
Creatinine, Ser: 0.92 mg/dL (ref 0.61–1.24)
Creatinine, Ser: 0.76 mg/dL (ref 0.61–1.24)
GFR, Estimated: >60 mL/min (ref >60)
GFR, Estimated: >60 mL/min (ref >60)
Glucose, Bld: 158 mg/dL — ABNORMAL HIGH (ref 70–99)
Glucose, Bld: 139 mg/dL — ABNORMAL HIGH (ref 70–99)
Potassium: 5 mmol/L (ref 3.5–5.1)
Potassium: 5.2 mmol/L — ABNORMAL HIGH (ref 3.5–5.1)
Sodium: 140 mmol/L (ref 135–145)
Sodium: 140 mmol/L (ref 135–145)

## 2022-01-25 LAB — THEOPHYLLINE LEVEL
Theophylline Lvl: 6.5 ug/mL — ABNORMAL LOW (ref 10.0–20.0)
Theophylline Lvl: 15.8 ug/mL (ref 10.0–20.0)

## 2022-01-25 LAB — GLUCOSE, CAPILLARY
Glucose-Capillary: 128 mg/dL — ABNORMAL HIGH (ref 70–99)
Glucose-Capillary: 139 mg/dL — ABNORMAL HIGH (ref 70–99)
Glucose-Capillary: 120 mg/dL — ABNORMAL HIGH (ref 70–99)
Glucose-Capillary: 119 mg/dL — ABNORMAL HIGH (ref 70–99)
Glucose-Capillary: 121 mg/dL — ABNORMAL HIGH (ref 70–99)

## 2022-01-25 LAB — BLOOD GAS, ARTERIAL
Acid-Base Excess: 12.5 mmol/L — ABNORMAL HIGH (ref 0.0–2.0)
Acid-Base Excess: 9.2 mmol/L — ABNORMAL HIGH (ref 0.0–2.0)
Bicarbonate: 40.3 mmol/L — ABNORMAL HIGH (ref 20.0–28.0)
Bicarbonate: 37.8 mmol/L — ABNORMAL HIGH (ref 20.0–28.0)
FIO2: 40 %
FIO2: 40 %
MECHVT: 480 mL
MECHVT: 480 mL
Mechanical Rate: 24
Mechanical Rate: 20
O2 Saturation: 96.8 %
O2 Saturation: 95.2 %
PEEP: 8 cmH2O
PEEP: 8 cmH2O
Patient temperature: 37
Patient temperature: 37
pCO2 arterial: 65 mmHg — ABNORMAL HIGH (ref 32–48)
pCO2 arterial: 70 mmHg (ref 32–48)
pH, Arterial: 7.4 (ref 7.35–7.45)
pH, Arterial: 7.34 — ABNORMAL LOW (ref 7.35–7.45)
pO2, Arterial: 76 mmHg — ABNORMAL LOW (ref 83–108)
pO2, Arterial: 65 mmHg — ABNORMAL LOW (ref 83–108)

## 2022-01-25 LAB — CBC
HCT: 39.6 % (ref 39.0–52.0)
Hemoglobin: 12.2 g/dL — ABNORMAL LOW (ref 13.0–17.0)
MCH: 30 pg (ref 26.0–34.0)
MCHC: 30.8 g/dL (ref 30.0–36.0)
MCV: 97.5 fL (ref 80.0–100.0)
Platelets: 142 10*3/uL — ABNORMAL LOW (ref 150–400)
RBC: 4.06 MIL/uL — ABNORMAL LOW (ref 4.22–5.81)
RDW: 13.8 % (ref 11.5–15.5)
WBC: 9.4 10*3/uL (ref 4.0–10.5)
nRBC: 0 % (ref 0.0–0.2)

## 2022-01-25 LAB — TRIGLYCERIDES: Triglycerides: 71 mg/dL (ref <150)

## 2022-01-25 LAB — PHOSPHORUS: Phosphorus: 3.3 mg/dL (ref 2.5–4.6)

## 2022-01-25 LAB — MAGNESIUM: Magnesium: 2.4 mg/dL (ref 1.7–2.4)

## 2022-01-25 LAB — POTASSIUM: Potassium: 4.1 mmol/L (ref 3.5–5.1)

## 2022-01-25 MED ORDER — POLYETHYLENE GLYCOL 3350 17 G PO PACK
17.0000 g | PACK | Freq: Every day | ORAL | Status: DC | PRN
  Filled 2022-01-25: qty 1

## 2022-01-25 MED ORDER — DILTIAZEM LOAD VIA INFUSION
12.0000 mg | Freq: Once | INTRAVENOUS | Status: AC
  Administered 2022-01-25: 12 mg via INTRAVENOUS
  Filled 2022-01-25: qty 12

## 2022-01-25 MED ORDER — LACTULOSE 10 GM/15ML PO SOLN
20.0000 g | Freq: Every day | ORAL | Status: DC
  Administered 2022-01-25 – 2022-02-02 (×8): 20 g
  Filled 2022-01-25 (×8): qty 30

## 2022-01-25 MED ORDER — VITAL HIGH PROTEIN PO LIQD
1000.0000 mL | ORAL | Status: DC
  Administered 2022-01-25 – 2022-01-27 (×4): 1000 mL

## 2022-01-25 MED ORDER — DILTIAZEM HCL-DEXTROSE 125-5 MG/125ML-% IV SOLN (PREMIX)
5.0000 mg/h | INTRAVENOUS | Status: AC
  Administered 2022-01-25: 23:00:00 10 mg/h via INTRAVENOUS
  Administered 2022-01-25: 13:00:00 8 mg/h via INTRAVENOUS
  Administered 2022-01-26 – 2022-01-28 (×4): 15 mg/h via INTRAVENOUS
  Filled 2022-01-25 (×7): qty 125

## 2022-01-25 MED ORDER — FREE WATER
50.0000 mL | Status: DC
Start: 2022-01-25 — End: 2022-01-28
  Administered 2022-01-25 – 2022-01-28 (×17): 50 mL

## 2022-01-25 MED ORDER — SENNOSIDES-DOCUSATE SODIUM 8.6-50 MG PO TABS
1.0000 | ORAL_TABLET | Freq: Two times a day (BID) | ORAL | Status: DC
  Administered 2022-01-25 – 2022-02-03 (×15): 1
  Filled 2022-01-25 (×15): qty 1

## 2022-01-25 MED ORDER — ACETAZOLAMIDE 250 MG PO TABS
500.0000 mg | ORAL_TABLET | Freq: Once | ORAL | Status: AC
  Administered 2022-01-25: 500 mg
  Filled 2022-01-25 (×2): qty 2

## 2022-01-25 MED ORDER — FUROSEMIDE 10 MG/ML IJ SOLN
20.0000 mg | Freq: Once | INTRAMUSCULAR | Status: AC
  Administered 2022-01-25: 20 mg via INTRAVENOUS
  Filled 2022-01-25: qty 2

## 2022-01-25 NOTE — Progress Notes (Signed)
Nutrition Brief Follow Up Note   Propofol change   INTERVENTION:   Increase Vital HP to 37ml/hr + ProSource TF 61ml QID via tube   Propofol: 24.5 ml/hr- provides 647kcal/day   Free water flushes 26ml q4 hours  Regimen provides 1687kcal/day, 151g/day protein and 956ml/day of free water   Liquid MVI daily via tube   Estimated Nutritional Needs:   Kcal:  1179-1500kcal/day  Protein:  >160g/day  Fluid:  2.4-2.7L/day  Betsey Holiday MS, RD, LDN Please refer to [facility] for RD and/or RD on-call/weekend/after hours pager

## 2022-01-25 NOTE — Consult Note (Signed)
PHARMACY CONSULT NOTE  Pharmacy Consult for Electrolyte Monitoring and Replacement   Recent Labs: Potassium (mmol/L)  Date Value  01/25/2022 5.2 (H)   Magnesium (mg/dL)  Date Value  23/55/7322 2.4   Calcium (mg/dL)  Date Value  02/54/2706 8.3 (L)   Albumin (g/dL)  Date Value  23/76/2831 3.6   Phosphorus (mg/dL)  Date Value  51/76/1607 3.3   Sodium (mmol/L)  Date Value  01/25/2022 140   Assessment: Patient is a 61 y/o M with medical history including COPD / asthma and tobacco abuse who presented to the ED 2/24 with acute respiratory failure secondary to asthma / COPD exacerbation. Patient ultimately required intubation. Disposition is the ICU. Pharmacy consulted to assist with electrolyte monitoring and replacement as indicated.  Nutrition: Vital HP @20ml /hr + ProSource TF 96ml QID via tube  Free water flushes 96m q4 hours  Goal of Therapy:  Electrolytes within normal limits  Plan:  K: 4.7>5>5.2; pt 6L net positive, UOP 2.5>1.1 so will administer Lasix 20mg  x1. --No electrolyte replacement indicated at this time --Follow-up electrolytes with AM labs   , PharmD, [facility] Clinical Pharmacist 01/25/2022 9:30 AM

## 2022-01-25 NOTE — Progress Notes (Signed)
[facility] CLINIC CARDIOLOGY CONSULT NOTE       Patient ID: Kenneth Benson MRN: [id number] DOB/AGE: 1961-08-27 61 y.o.  Admit date: 01/21/2022 Referring Physician Dr. Donna Bernard Primary Physician  Primary Cardiologist  Reason for Consultation EKG changes  HPI: The patient is a 61 year old male with a past medical history notable for asthma, COPD, and current tobacco use who presented to [facility] ED in the early morning hours complaining of shortness of breath with worsening cough and congestion.  Per EMS reporting the patient's O2 sats were in the 60s and he was given 2 DuoNebs and Solu-Medrol on route.  He presented to the ED in moderate respiratory distress with diffuse wheezing and had chest tightness without chest pain.  He was given DuoNebs x3 and 2 g of IV mag and was subsequently placed on BiPAP and eventually required intubation due to worsening respiratory status.  Cardiology is consulted because his EKG changes and ventricular bigeminy.  He was started on amiodarone on 2/24 by primary team to decrease PVC burden. He had new breakthrough atrial fibrillation noted on telemetry on 2/28 and amiodarone d/c and IV diltiazem initiated for rate control.   Interval History:  - review of tele shows patient atrial fibrillation this morning which is new. Was rate controlled in the high 80s-low 100s around 0830 during initial assessment. Around 1315 this afternoon his rate as increased to 120s-130s. -amiodarone and aminophylline stopped this morning per primary team.  -paralytics weaned off 2/26, still sedated with propofol and fentanyl    Review of systems unable to be assessed d/t patients sedation   Past Medical History:  Diagnosis Date   Asthma    Tobacco abuse     History reviewed. No pertinent surgical history.  Medications Prior to Admission  Medication Sig Dispense Refill Last Dose   albuterol (PROVENTIL HFA;VENTOLIN HFA) 108 (90 BASE) MCG/ACT inhaler Inhale 2 puffs into the lungs  every 6 (six) hours as needed for wheezing. (Patient not taking: Reported on 01/21/2022)   Not Taking   ibuprofen (ADVIL,MOTRIN) 600 MG tablet Take 1 tablet (600 mg total) by mouth every 8 (eight) hours as needed. (Patient not taking: Reported on 01/21/2022) 20 tablet 0 Not Taking   Social History   Socioeconomic History   Marital status: Single    Spouse name: Not on file   Number of children: Not on file   Years of education: Not on file   Highest education level: Not on file  Occupational History   Not on file  Tobacco Use   Smoking status: Every Day   Smokeless tobacco: Not on file  Substance and Sexual Activity   Alcohol use: No   Drug use: No   Sexual activity: Not on file  Other Topics Concern   Not on file  Social History Narrative   Not on file   Social Determinants of Health   Financial Resource Strain: Not on file  Food Insecurity: Not on file  Transportation Needs: Not on file  Physical Activity: Not on file  Stress: Not on file  Social Connections: Not on file  Intimate Partner Violence: Not on file    History reviewed. No pertinent family history.    Review of systems complete and found to be negative unless listed above    PHYSICAL EXAM General: Acutely ill-appearing middle-aged Caucasian male, intubated in the ICU  HEENT:  Normocephalic and atraumatic. Neck:  No JVD.  Lungs: Intubated.  Extremely poor air movement, coarse breath sounds and inspiratory  and expiratory wheezes Heart: irregularly irregular rhythm and rate. Normal S1 and S2 without gallops or murmurs. Radial & DP pulses 2+ bilaterally. Abdomen: Soft, obese Msk: Normal tone for age. Extremities: Warm and well perfused. No clubbing, cyanosis.  Trace pedal edema bilaterally with unna boots and SCDs in place Neuro: Sedated. Psych: Unable to assess due to sedation  Labs:   Lab Results  Component Value Date   WBC 9.4 01/25/2022   HGB 12.2 (L) 01/25/2022   HCT 39.6 01/25/2022   MCV 97.5  01/25/2022   PLT 142 (L) 01/25/2022    Recent Labs  Lab 01/22/22 0358 01/23/22 0427 01/25/22 0312  NA 136   < > 140  K 5.0   < > 5.2*  CL 96*   < > 99  CO2 32   < > 37*  BUN 27*   < > 30*  CREATININE 1.16   < > 0.76  CALCIUM 7.6*   < > 8.3*  PROT 6.7  --   --   BILITOT 0.6  --   --   ALKPHOS 40  --   --   ALT 33  --   --   AST 26  --   --   GLUCOSE 163*   < > 139*   < > = values in this interval not displayed.    No results found for: CKTOTAL, CKMB, CKMBINDEX, TROPONINI No results found for: CHOL No results found for: HDL No results found for: [facility] Lab Results  Component Value Date   TRIG 71 01/25/2022   TRIG 93 01/22/2022   No results found for: CHOLHDL No results found for: LDLDIRECT    Radiology: DG Chest 1 View  Result Date: 01/21/2022 CLINICAL DATA:  Intubation EXAM: CHEST  1 VIEW COMPARISON:  None. FINDINGS: Endotracheal tube 4.7 cm from carina in good position. NG tube extends the stomach. Lungs are clear.  Normal cardiac silhouette.  No pneumothorax. IMPRESSION: 1. Support apparatus in good position. 2. No acute cardiopulmonary findings. Electronically Signed   By: Genevive Bi M.D.   On: 01/21/2022 09:19   DG Abdomen 1 View  Result Date: 01/21/2022 CLINICAL DATA:  A 61 year old male presents for evaluation of OG tube placement. EXAM: ABDOMEN - 1 VIEW COMPARISON:  Chest x-ray January 21, 2021 FINDINGS: EKG leads project over the patient's chest and abdomen. Bowel gas pattern not well assessed, abdomen incompletely imaged. Imaged focused over the lower chest and upper abdomen with OG tube tip in the gastric fundus. Side port not well delineated, potentially obscured by overlying EKG lead wires. IMPRESSION: OG tube tip in the gastric fundus. Side port not well delineated on current imaging based on overlying EKG lead wires. The tube is approximately 7-8 cm into the stomach based on projection. Electronically Signed   By: Donzetta Kohut M.D.   On: 01/21/2022  09:20   CT Angio Chest PE W/Cm &/Or Wo Cm  Result Date: 01/21/2022 CLINICAL DATA:  A 61 year old male presents for suspected pulmonary embolism, now intubated. EXAM: CT ANGIOGRAPHY CHEST WITH CONTRAST TECHNIQUE: Multidetector CT imaging of the chest was performed using the standard protocol during bolus administration of intravenous contrast. Multiplanar CT image reconstructions and MIPs were obtained to evaluate the vascular anatomy. RADIATION DOSE REDUCTION: This exam was performed according to the departmental dose-optimization program which includes automated exposure control, adjustment of the mA and/or kV according to patient size and/or use of iterative reconstruction technique. CONTRAST:  22mL OMNIPAQUE IOHEXOL 350 MG/ML SOLN COMPARISON:  Chest x-ray of January 21, 2022. FINDINGS: Cardiovascular: Normal caliber of the thoracic aorta with calcified and noncalcified plaque. Central pulmonary vasculature is normal caliber. Main pulmonary artery attenuation is 165 Hounsfield units which limits assessment as does the presence of respiratory motion. Accounting for limitations no signs of central or lobar embolus. No proximal segmental embolus to the extent that can be evaluated. Heart size is normal without signs of pericardial effusion. Mediastinum/Nodes: No acute findings in the mediastinum. Endotracheal tube terminates in the mid trachea. Gastric tube courses through the esophagus. No signs of adenopathy in the chest. Lungs/Pleura: No pneumothorax. No consolidation. No pleural effusion. Narrowing of mainstem bronchi suggests bronchomalacia, some dynamic assessment based on respiratory motion in the central chest on the current study with slit like appearance of bronchus intermedius. Material in lower lobe bronchi on the LEFT and to a lesser extent on the RIGHT with mild bronchial wall thickening. Upper Abdomen: Incidental imaging of upper abdominal contents without acute process related to visualized  liver, pancreas, spleen, adrenal glands or kidneys. No acute gastrointestinal process to the extent evaluated. Gastric tube coiled in the stomach. Musculoskeletal: No chest wall abnormality. No acute or significant osseous findings. Review of the MIP images confirms the above findings. IMPRESSION: 1. Limited evaluation, no signs of pulmonary embolism to the central segmental level. 2. Suspected bronchomalacia and potential early aspiration changes. No lobar consolidation or frank imaged itis at this time. 3. Aortic atherosclerosis. 4. Gastric tube coiled in the stomach. Aortic Atherosclerosis (ICD10-I70.0). Electronically Signed   By: Donzetta Kohut M.D.   On: 01/21/2022 10:27   DG Chest Port 1 View  Result Date: 01/25/2022 CLINICAL DATA:  Acute respiratory failure and hypoxia. EXAM: PORTABLE CHEST 1 VIEW COMPARISON:  Chest radiograph dated 01/23/2022. FINDINGS: Support apparatus similar in position. No focal consolidation, pleural effusion, pneumothorax. The cardiac silhouette is within normal limits. No acute osseous pathology. IMPRESSION: No active disease. Electronically Signed   By: Elgie Collard M.D.   On: 01/25/2022 02:42   DG Chest Port 1 View  Result Date: 01/23/2022 CLINICAL DATA:  Ventilator dependent respiratory failure. EXAM: PORTABLE CHEST 1 VIEW COMPARISON:  Portable chest yesterday at 3:46 p.m. FINDINGS: 5:14 a.m., 01/23/2022. Left IJ central line remains in the upper SVC. ETT tip is 3.7 cm from the carina. NGT is partially coiled in the stomach. There is mild cardiomegaly, normal caliber central vessels. Stable mediastinum with aortic atherosclerosis. There is increased retrocardiac/infrahilar left lower lobe opacity which could be due to atelectasis, pneumonia or aspiration. Bronchial thickening is also increased centrally. No pleural effusion is seen.  There is thoracic spondylosis. IMPRESSION: Increased left lower lobe opacity consistent with atelectasis, pneumonia or aspiration, with  increased findings of central bronchitis. Remaining lung fields are clear. Electronically Signed   By: Almira Bar M.D.   On: 01/23/2022 07:11   DG Chest Port 1 View  Result Date: 01/22/2022 CLINICAL DATA:  Replacement of endotracheal tube EXAM: PORTABLE CHEST 1 VIEW COMPARISON:  Previous studies including the examination done earlier today FINDINGS: Tip of endotracheal tube is approximately 5.6 cm above the carina. Tip of enteric tube is seen in the fundus of the stomach. Transverse diameter of heart is in the upper limits of normal. There are no signs of pulmonary edema or focal pulmonary consolidation. There is no pleural effusion. Apices are not included in their entirety limiting evaluation for minimal pneumothorax. IMPRESSION: There are no new infiltrates or signs of pulmonary edema. Electronically Signed   By: Rhae Hammock  Rathinasamy M.D.   On: 01/22/2022 16:15   DG Chest Port 1 View  Result Date: 01/22/2022 CLINICAL DATA:  Follow-up for respiratory failure. EXAM: PORTABLE CHEST 1 VIEW COMPARISON:  01/21/2022 and older studies. FINDINGS: Cardiac silhouette normal in size. Clear lungs.  No pneumothorax. Endotracheal tube, left internal jugular central venous line and nasal/orogastric tube are stable. IMPRESSION: 1. No acute cardiopulmonary disease. 2. No change from the previous day's study. Electronically Signed   By: Amie Portlandavid  Ormond M.D.   On: 01/22/2022 09:24   DG Chest Port 1 View  Result Date: 01/21/2022 CLINICAL DATA:  Status post central line placement EXAM: PORTABLE CHEST 1 VIEW COMPARISON:  AP chest 01/21/2022 earlier same day at 904 hours FINDINGS: 01/21/2022 at 1215 hours Left internal jugular central venous catheter tip overlies the junction of the left brachiocephalic vein and superior vena cava. Enteric tube descends below the diaphragm with the tip overlying the gastric fundus in the left upper quadrant. Cardiac silhouette and mediastinal contours are unchanged and within normal limits  with mild calcification seen within the aortic arch. The lungs are clear.  No pleural effusion or pneumothorax. Moderate multilevel degenerative bridging osteophytes of the thoracic spine. IMPRESSION: New left internal jugular central venous catheter tip overlies the junction of the left brachiocephalic vein and superior vena cava. No acute lung process. Electronically Signed   By: Neita Garnetonald  Viola M.D.   On: 01/21/2022 12:36   DG Chest Portable 1 View  Result Date: 01/21/2022 CLINICAL DATA:  61 year old male with history of shortness of breath. EXAM: PORTABLE CHEST 1 VIEW COMPARISON:  Chest x-ray 10/11/2015. FINDINGS: Lung volumes are normal. No consolidative airspace disease. No pleural effusions. No pneumothorax. No pulmonary nodule or mass noted. Pulmonary vasculature and the cardiomediastinal silhouette are within normal limits. IMPRESSION: No radiographic evidence of acute cardiopulmonary disease. Electronically Signed   By: Trudie Reedaniel  Entrikin M.D.   On: 01/21/2022 06:11   ECHOCARDIOGRAM COMPLETE  Result Date: 01/21/2022    ECHOCARDIOGRAM REPORT   [facility] Date of Exam: 01/21/2022 Medical Rec #:  [id number]     Height:       72.0 in Accession #:    [id number]    Weight:       236.3 lb Date of Birth:  1960-12-09    BSA:          2.287 m Patient Age:    60 years      BP:           110/67 mmHg Patient Gender: M             HR:           84 bpm. Exam Location:  [facility] Procedure: 2D Echo, Cardiac Doppler and Color Doppler Indications:     Acute respiratory distress R06.03  History:         Patient has no prior history of Echocardiogram examinations.                  Tobacco abuse.  Sonographer:     Cristela BlueJerry Hege Referring Phys:  [id number] CARMEN L GONZALEZ Diagnosing Phys: Alwyn Peawayne D Callwood MD  Sonographer Comments: Suboptimal apical window, suboptimal parasternal window and echo performed with patient supine and on artificial respirator. IMPRESSIONS  1. Left ventricular ejection fraction, by  estimation, is 60 to 65%. The left ventricle has normal function. The left ventricle has no regional wall motion abnormalities. The left ventricular internal cavity size was mildly dilated. Left ventricular  diastolic parameters were normal. There is the interventricular septum is flattened in diastole ('D' shaped left ventricle), consistent with right ventricular volume overload.  2. Right ventricular systolic function is normal. The right ventricular size is normal.  3. The mitral valve is normal in structure. Trivial mitral valve regurgitation.  4. The aortic valve is normal in structure. Aortic valve regurgitation is not visualized. FINDINGS  Left Ventricle: Left ventricular ejection fraction, by estimation, is 60 to 65%. The left ventricle has normal function. The left ventricle has no regional wall motion abnormalities. The left ventricular internal cavity size was mildly dilated. There is  borderline concentric left ventricular hypertrophy. The interventricular septum is flattened in diastole ('D' shaped left ventricle), consistent with right ventricular volume overload. Left ventricular diastolic parameters were normal. Right Ventricle: The right ventricular size is normal. No increase in right ventricular wall thickness. Right ventricular systolic function is normal. Left Atrium: Left atrial size was normal in size. Right Atrium: Right atrial size was normal in size. Pericardium: There is no evidence of pericardial effusion. Mitral Valve: The mitral valve is normal in structure. Trivial mitral valve regurgitation. Tricuspid Valve: The tricuspid valve is normal in structure. Tricuspid valve regurgitation is trivial. Aortic Valve: The aortic valve is normal in structure. Aortic valve regurgitation is not visualized. Aortic valve mean gradient measures 2.0 mmHg. Aortic valve peak gradient measures 2.8 mmHg. Aortic valve area, by VTI measures 3.82 cm. Pulmonic Valve: The pulmonic valve was normal in structure.  Pulmonic valve regurgitation is trivial. Aorta: The ascending aorta was not well visualized. IAS/Shunts: No atrial level shunt detected by color flow Doppler.  LEFT VENTRICLE PLAX 2D LVIDd:         5.10 cm   Diastology LVIDs:         3.30 cm   LV e' medial:    10.80 cm/s LV PW:         1.10 cm   LV E/e' medial:  7.5 LV IVS:        0.90 cm   LV e' lateral:   6.53 cm/s LVOT diam:     2.30 cm   LV E/e' lateral: 12.3 LV SV:         52 LV SV Index:   23 LVOT Area:     4.15 cm  RIGHT VENTRICLE RV Basal diam:  3.40 cm RV S prime:     7.94 cm/s TAPSE (M-mode): 2.4 cm LEFT ATRIUM             Index        RIGHT ATRIUM           Index LA diam:        3.40 cm 1.49 cm/m   RA Area:     16.20 cm LA Vol (A2C):   36.9 ml 16.13 ml/m  RA Volume:   40.10 ml  17.53 ml/m LA Vol (A4C):   31.2 ml 13.64 ml/m LA Biplane Vol: 34.4 ml 15.04 ml/m  AORTIC VALVE                    PULMONIC VALVE AV Area (Vmax):    4.10 cm     PV Vmax:        0.75 m/s AV Area (Vmean):   3.30 cm     PV Vmean:       45.800 cm/s AV Area (VTI):     3.82 cm     PV VTI:  0.131 m AV Vmax:           83.20 cm/s   PV Peak grad:   2.3 mmHg AV Vmean:          59.200 cm/s  PV Mean grad:   1.0 mmHg AV VTI:            0.137 m      RVOT Peak grad: 3 mmHg AV Peak Grad:      2.8 mmHg AV Mean Grad:      2.0 mmHg LVOT Vmax:         82.20 cm/s LVOT Vmean:        47.000 cm/s LVOT VTI:          0.126 m LVOT/AV VTI ratio: 0.92  AORTA Ao Root diam: 3.50 cm MITRAL VALVE MV Area (PHT): 3.42 cm    SHUNTS MV Decel Time: 222 msec    Systemic VTI:  0.13 m MV E velocity: 80.60 cm/s  Systemic Diam: 2.30 cm MV A velocity: 58.30 cm/s  Pulmonic VTI:  0.163 m MV E/A ratio:  1.38 Dwayne D Callwood MD Electronically signed by Alwyn Pea MD Signature Date/Time: 01/21/2022/3:53:12 PM    Final     ECHO EF 60-65% with mild LV dilation, and RV overload without significant valvular pathologies.   TELEMETRY reviewed by me: afib rate between 83 and 103 with frequent PVCs.   EKG  reviewed by me: initial EKG at 0538 showed sinus tach with rate of 140 and premature atrial complexes, repeat EKG at 0730 showed sinus tach with somewhat improved rate at 103 with PVCs frequently, most recent EKG at 08 54 showed sinus rhythm with ventricular bigeminy rate 98  ASSESSMENT AND PLAN:  The patient is a 61 year old male with a past medical history notable for asthma, COPD, and current tobacco use who presented to [facility] ED in the early morning hours complaining of shortness of breath with worsening cough and congestion.  Per EMS reporting the patient's O2 sats were in the 60s and he was given 2 DuoNebs and Solu-Medrol on route.  He presented to the ED in moderate respiratory distress with diffuse wheezing and had chest tightness without chest pain.  He was given DuoNebs x3 and 2 g of IV mag and was subsequently placed on BiPAP and eventually required intubation due to worsening respiratory status.  Cardiology is consulted because his EKG changes and ventricular bigeminy.  He was started on amiodarone on 2/24 by primary team to decrease PVC burden. He had new breakthrough atrial fibrillation noted on telemetry on 2/28 and amiodarone d/c and IV diltiazem initiated for rate control.   #new onset atrial fibrillation  #Frequent PVCs/ventricular bigeminy -s/p amiodarone bolus and gtt from 2/24-2/28 with breakthrough new onset AF this morning -ordered diltiazem bolus and gtt for rate control and hopeful conversion to NSR -Continuous monitoring on telemetry -Recommend monitoring of electrolytes and replete in mag and potassium as needed -Echo resulted with preserved EF 60-65% with mild LV dilation, and RV overload without significant valvular pathologies.  -CHADSVASC 0 from information available via chart review. Will defer anticoagulation for now but will consider adding anticoagulation if patient remains in AF and cardioversion is eventually needed.   #Acute hypoxic respiratory failure 2/2  COPD/asthma exacerbation #Tobacco abuse #Aortic atherosclerosis on CTA chest -Agree with current therapy and supportive care for COPD/asthma exacerbation per PCCM -As needed Lasix for volume overload, s/p 20 mg IV Lasix x2 -Recommend outpatient follow-up with cardiology for risk factor  modification with aortic atherosclerosis on CT imaging on admission -Agree with smoking cessation therapy in the future  This patient's plan of care was discussed and created with Dr. Marcina MillardAlexander Paraschos and he is in agreement.  Signed: Rebeca AllegraLily Michelle Fox Salminen , PA-C 01/25/2022, 8:18 AM [facility]

## 2022-01-25 NOTE — Progress Notes (Signed)
NAME:  Kenneth Benson, MRN:  [id number], DOB:  Jun 19, 1961, LOS: 4 ADMISSION DATE:  01/21/2022, CONSULTATION DATE: 01/21/2022 REFERRING MD: Dr. Cheri Fowler, CHIEF COMPLAINT: Shortness of Breath    History of Present Illness:  This is a 61 yo male who presented to [facility] ER via EMS on 02/24 with c/o shortness of breath, cough, wheezing, and congestion onset of symptoms several days prior to presentation. He reported using his inhaler without relief of symptoms.  EMS reported pt severely hypoxic with O2 sats in the 60's.  He received duoneb's x2 and 125 mg of iv solumedrol en route to the ER.  All hx obtained from chart review pt currently sedated and mechanically intubated with no family at bedside.   ED Course Upon arrival to the ER pt noted to be in moderate respiratory distress with diffuse wheezes per ED provider notes.  CXR, COVID-19/Influenza A&B, and pct negative.  VBG revealed pH 7.17/pCO2 99/bicarb 36.1.  EKG revealed sinus tachycardia hr 140 without ST elevation/depression and troponin 9.  He received duoneb x3 and 2 g of iv magnesium.  He was subsequently placed on Bipap with initial improvement of respiratory status.  However, respiratory status rapidly declined and pt required mechanical intubation.  He received an additional 2 g of iv magnesium and duoneb post intubation.  PCCM team contacted for ICU admission.  Pertinent  Medical History  COPD Current Smoker  Asthma   Significant Hospital Events: Including procedures, antibiotic start and stop dates in addition to other pertinent events   02/24: Pt admitted to ICU with acute on chronic hypercapnic hypoxic respiratory failure secondary to asthma and COPD exacerbation requiring mechanical intubation  02/25: Remains intubated, vent changes allowed 02/27: Remains intubated, slowly weaning vent support. Respiratory culture with normal respiratory flora  Interim History / Subjective:  -No significant events noted overnight; will attempt to  wean sedation for a RASS goal of -1.  Vent settings: FiO2 40% and PEEP 8 with O2 sats 97%.  Will obtain ABG to determine if inspiratory time and respiratory rate can be adjusted   Objective   Blood pressure 135/75, pulse 63, temperature 98.8 F (37.1 C), resp. rate (!) 25, height 6' 0.01" (1.829 m), weight 112.3 kg, SpO2 99 %. CVP:  [13 mmHg-14 mmHg] 14 mmHg  Vent Mode: PRVC FiO2 (%):  [40 %] 40 % Set Rate:  [24 bmp] 24 bmp Vt Set:  [450 mL-480 mL] 450 mL PEEP:  [8 cmH20] 8 cmH20 Plateau Pressure:  [23 cmH20-29 cmH20] 23 cmH20   During rounds was able to switch patient to PRVC: Vent Mode: PRVC FiO2 (%):  [40 %] 40 % Set Rate:  [24 bmp] 24 bmp Vt Set:  [450 mL-480 mL] 450 mL PEEP:  [8 cmH20] 8 cmH20 Plateau Pressure:  [23 cmH20-29 cmH20] 23 cmH20   Intake/Output Summary (Last 24 hours) at 01/25/2022 0814 Last data filed at 01/25/2022 0700 Gross per 24 hour  Intake 3677.96 ml  Output 2670 ml  Net 1007.96 ml   Filed Weights   01/23/22 0500 01/24/22 0500 01/25/22 0500  Weight: 112.4 kg 109.4 kg 112.3 kg    Examination: General: Critically ill appearing male, sedated and mechanically ventilated, in NAD HEENT: supple, JVD present, trachea midline, no crepitus Lungs: diffuse rhonchi with inspiratory and expiratory wheezing, even synchronous with the vent Cardiovascular: Sinus with frequent PVC's and intermittent ventricular bigeminy, no R/G, 2+ radial/2+ distal pulses, trace bilateral upper extremity edema  Abdomen: +BS x4, obese, soft, non tender, non distended  Extremities: normal bulk and tone,  Neuro: Heavily sedated, unable to follow commands, PERRL GU: indwelling foley draining clear yellow urine   Resolved Hospital Problem list   Acute kidney injury likely due to ATN Mild hyponatremia, hypochloremia  Assessment & Plan:   Acute on chronic hypoxic hypercapnic respiratory failure secondary to asthma and COPD exacerbation  Status asthmaticus Respiratory acidosis, severe  due to the above Full vent support for now: vent settings reviewed with RT Continues on PRVC today SBT once all parameters met  Continue aminophylline gtt: monitor theophylline levels, level from 2/24 pending IV steroids: wean as tolerated, currently on 60 mg IV twice daily Continue scheduled and prn bronchodilator therapy  Respiratory panel positive for rhinovirus Respiratory culture with normal respiratory flora Trend WBC and monitor fever curve  Procalcitonin less than 0.10 no indication for abx therapy  PAD protocol: maintain RASS goal -1   Propofol, and fentanyl gtts to maintain RASS goal  Nimbex weaned off 2/26 WUA once pt stabilized  Continue bowel regimen  Once extubated will need smoking cessation counseling   Mild hyperkalemia  Monitor I&O's / urinary output Follow BMP Ensure adequate renal perfusion Avoid nephrotoxic agents as able Replace electrolytes as indicated  Ventricular trigeminy with frequent PVC's Continuous telemetry monitoring  Continue amiodarone infusion Replace electrolytes as indicated  Echo: LVEF 60 to 65%, mild LV dilatation increase right ventricular volume overload Troponins normal Cardiology consulted appreciate input Pt 6L positive will administer 20 mg iv lasix x1 dose   Best Practice (right click and "Reselect all SmartList Selections" daily)   Diet/type: NPO, tube feeds DVT prophylaxis: LMWH GI prophylaxis: PPI Lines: Central line and yes and it is still needed Foley:  Yes, and it is still needed Code Status:  full code Last date of multidisciplinary goals of care discussion [01/25/2022]  Labs   CBC: Recent Labs  Lab 01/21/22 0544 01/22/22 0358 01/23/22 0427 01/24/22 0432 01/25/22 0312  WBC 8.4 13.9* 15.5* 10.5 9.4  NEUTROABS 6.4  --  13.6*  --   --   HGB 15.9 14.3 13.6 12.4* 12.2*  HCT 50.9 45.5 42.0 39.3 39.6  MCV 96.8 97.0 95.0 95.9 97.5  PLT 191 224 163 142* 142*    Basic Metabolic Panel: Recent Labs  Lab  01/21/22 1130 01/21/22 2118 01/22/22 0358 01/23/22 0427 01/24/22 0432 01/25/22 0312  NA 139  --  136 133* 140 140  K 5.6* 5.3* 5.0 4.7 5.0 5.2*  CL 101  --  96* 92* 97* 99  CO2 33*  --  32 33* 34* 37*  GLUCOSE 167*  --  163* 140* 179* 139*  BUN 18  --  27* 43* 27* 30*  CREATININE 0.90  --  1.16 1.75* 0.92 0.76  CALCIUM 7.8*  --  7.6* 8.3* 8.2* 8.3*  MG 2.6* 2.4 2.2 2.4 2.6* 2.4  PHOS 5.5*  --  3.5 3.9 3.0 3.3   GFR: Estimated Creatinine Clearance: 128.7 mL/min (by C-G formula based on SCr of 0.76 mg/dL). Recent Labs  Lab 01/21/22 0854 01/21/22 1021 01/22/22 0358 01/23/22 0427 01/24/22 0432 01/25/22 0312  PROCALCITON <0.10  --  <0.10 <0.10 <0.10  --   WBC  --   --  13.9* 15.5* 10.5 9.4  LATICACIDVEN 1.0 1.1  --   --   --   --     Liver Function Tests: Recent Labs  Lab 01/21/22 1130 01/22/22 0358  AST  --  26  ALT  --  33  ALKPHOS  --  40  BILITOT  --  0.6  PROT  --  6.7  ALBUMIN 4.1 3.6   No results for input(s): LIPASE, AMYLASE in the last 168 hours. No results for input(s): AMMONIA in the last 168 hours.  ABG    Component Value Date/Time   PHART 7.32 (L) 01/23/2022 1010   PCO2ART 57 (H) 01/23/2022 1010   PO2ART 59 (L) 01/23/2022 1010   HCO3 29.4 (H) 01/23/2022 1010   O2SAT 93.9 01/23/2022 1010      Coagulation Profile: No results for input(s): INR, PROTIME in the last 168 hours.  Cardiac Enzymes: No results for input(s): CKTOTAL, CKMB, CKMBINDEX, TROPONINI in the last 168 hours.  HbA1C: Hgb A1c MFr Bld  Date/Time Value Ref Range Status  01/21/2022 02:36 PM 4.9 4.8 - 5.6 % Final    Comment:    (NOTE) Pre diabetes:          5.7%-6.4%  Diabetes:              >6.4%  Glycemic control for   <7.0% adults with diabetes     CBG: Recent Labs  Lab 01/24/22 1626 01/24/22 2045 01/24/22 2324 01/25/22 0328 01/25/22 0743  GLUCAP 131* 163* 119* 128* 139*    Review of Systems:   Unable to assess pt mechanically ventilated  Allergies No  Known Allergies   Scheduled Meds:  budesonide (PULMICORT) nebulizer solution  0.25 mg Nebulization BID   chlorhexidine gluconate (MEDLINE KIT)  15 mL Mouth Rinse BID   Chlorhexidine Gluconate Cloth  6 each Topical Q0600   docusate  100 mg Per Tube BID   enoxaparin (LOVENOX) injection  0.5 mg/kg Subcutaneous Q24H   feeding supplement (PROSource TF)  90 mL Per Tube QID   feeding supplement (VITAL HIGH PROTEIN)  1,000 mL Per Tube Q24H   free water  100 mL Per Tube Q4H   insulin aspart  0-15 Units Subcutaneous Q4H   ipratropium-albuterol  3 mL Nebulization Q4H   mouth rinse  15 mL Mouth Rinse 10 times per day   methylPREDNISolone (SOLU-MEDROL) injection  40 mg Intravenous Q12H   multivitamin  15 mL Per Tube Daily   pantoprazole (PROTONIX) IV  40 mg Intravenous Q24H   polyethylene glycol  17 g Per Tube Daily   Continuous Infusions:  sodium chloride 10 mL/hr at 01/25/22 0230   aminophylline 1 mg/mL infusion 0.7 mg/kg/hr (01/25/22 0754)   amiodarone 30 mg/hr (01/25/22 0653)   fentaNYL infusion INTRAVENOUS 300 mcg/hr (01/25/22 0230)   phenylephrine (NEO-SYNEPHRINE) Adult infusion Stopped (01/24/22 0838)   propofol (DIPRIVAN) infusion 50 mcg/kg/min (01/25/22 0632)   PRN Meds:.acetaminophen, albuterol, docusate sodium, fentaNYL (SUBLIMAZE) injection, fentaNYL (SUBLIMAZE) injection, ondansetron (ZOFRAN) IV, polyethylene glycol   Critical care time: 45 minutes     Donell Beers, [place] Pager [phone] (please enter 7 digits) PCCM Consult Pager [phone] (please enter 7 digits)

## 2022-01-26 LAB — BASIC METABOLIC PANEL
Anion gap: 7 (ref 5–15)
BUN: 39 mg/dL — ABNORMAL HIGH (ref 6–20)
CO2: 39 mmol/L — ABNORMAL HIGH (ref 22–32)
Calcium: 8.5 mg/dL — ABNORMAL LOW (ref 8.9–10.3)
Chloride: 98 mmol/L (ref 98–111)
Creatinine, Ser: 0.78 mg/dL (ref 0.61–1.24)
GFR, Estimated: >60 mL/min (ref >60)
Glucose, Bld: 149 mg/dL — ABNORMAL HIGH (ref 70–99)
Potassium: 4.3 mmol/L (ref 3.5–5.1)
Sodium: 144 mmol/L (ref 135–145)

## 2022-01-26 LAB — CBC
HCT: 40.1 % (ref 39.0–52.0)
Hemoglobin: 12.2 g/dL — ABNORMAL LOW (ref 13.0–17.0)
MCH: 30.4 pg (ref 26.0–34.0)
MCHC: 30.4 g/dL (ref 30.0–36.0)
MCV: 100 fL (ref 80.0–100.0)
Platelets: 153 10*3/uL (ref 150–400)
RBC: 4.01 MIL/uL — ABNORMAL LOW (ref 4.22–5.81)
RDW: 13.8 % (ref 11.5–15.5)
WBC: 9.3 10*3/uL (ref 4.0–10.5)
nRBC: 0 % (ref 0.0–0.2)

## 2022-01-26 LAB — BLOOD GAS, ARTERIAL
Acid-Base Excess: 6.1 mmol/L — ABNORMAL HIGH (ref 0.0–2.0)
Bicarbonate: 34.5 mmol/L — ABNORMAL HIGH (ref 20.0–28.0)
FIO2: 40 %
Mechanical Rate: 28
O2 Saturation: 95.4 %
PEEP: 8 cmH2O
Patient temperature: 37
Pressure control: 31 cmH2O
pCO2 arterial: 67 mmHg (ref 32–48)
pH, Arterial: 7.32 — ABNORMAL LOW (ref 7.35–7.45)
pO2, Arterial: 70 mmHg — ABNORMAL LOW (ref 83–108)

## 2022-01-26 LAB — GLUCOSE, CAPILLARY
Glucose-Capillary: 101 mg/dL — ABNORMAL HIGH (ref 70–99)
Glucose-Capillary: 136 mg/dL — ABNORMAL HIGH (ref 70–99)
Glucose-Capillary: 127 mg/dL — ABNORMAL HIGH (ref 70–99)
Glucose-Capillary: 135 mg/dL — ABNORMAL HIGH (ref 70–99)
Glucose-Capillary: 125 mg/dL — ABNORMAL HIGH (ref 70–99)
Glucose-Capillary: 129 mg/dL — ABNORMAL HIGH (ref 70–99)
Glucose-Capillary: 128 mg/dL — ABNORMAL HIGH (ref 70–99)

## 2022-01-26 LAB — HEPARIN LEVEL (UNFRACTIONATED): Heparin Unfractionated: 0.12 IU/mL — ABNORMAL LOW (ref 0.30–0.70)

## 2022-01-26 LAB — PHOSPHORUS: Phosphorus: 4.2 mg/dL (ref 2.5–4.6)

## 2022-01-26 LAB — PROTIME-INR
INR: 1.1 (ref 0.8–1.2)
Prothrombin Time: 14.6 seconds (ref 11.4–15.2)

## 2022-01-26 LAB — MAGNESIUM: Magnesium: 2.3 mg/dL (ref 1.7–2.4)

## 2022-01-26 LAB — APTT: aPTT: 27 seconds (ref 24–36)

## 2022-01-26 MED ORDER — HEPARIN (PORCINE) 25000 UT/250ML-% IV SOLN
2100.0000 [IU]/h | INTRAVENOUS | Status: DC
Start: 2022-01-26 — End: 2022-01-27
  Administered 2022-01-26: 22:00:00 1700 [IU]/h via INTRAVENOUS
  Administered 2022-01-26: 11:00:00 1400 [IU]/h via INTRAVENOUS
  Administered 2022-01-27: 2100 [IU]/h via INTRAVENOUS
  Filled 2022-01-26 (×3): qty 250

## 2022-01-26 MED ORDER — HEPARIN BOLUS VIA INFUSION
5000.0000 [IU] | Freq: Once | INTRAVENOUS | Status: AC
  Administered 2022-01-26: 5000 [IU] via INTRAVENOUS
  Filled 2022-01-26: qty 5000

## 2022-01-26 MED ORDER — HEPARIN BOLUS VIA INFUSION
3000.0000 [IU] | Freq: Once | INTRAVENOUS | Status: AC
Start: 2022-01-26 — End: 2022-01-26
  Administered 2022-01-26: 3000 [IU] via INTRAVENOUS
  Filled 2022-01-26: qty 3000

## 2022-01-26 NOTE — Consult Note (Signed)
ANTICOAGULATION CONSULT NOTE - Consult ? ?Pharmacy Consult for Heparin gtt ?Indication: atrial fibrillation ? ?No Known Allergies ? ?Patient Measurements: ?Height: 6' 0.01" (182.9 cm) ?Weight: 105.8 kg (233 lb 4 oz) ?IBW/kg (Calculated) : 77.62 ?Heparin Dosing Weight: 99.7kg  ? ?Vital Signs: ?Temp: 99.1 ?F (37.3 ?C) (03/01 0800) ?Temp Source: Bladder (03/01 0800) ?BP: 124/74 (03/01 0800) ?Pulse Rate: 97 (03/01 0717) ? ?Labs: ?Recent Labs  ?  01/24/22 ?0432 01/25/22 ?0312 01/26/22 ?7829  ?HGB 12.4* 12.2* 12.2*  ?HCT 39.3 39.6 40.1  ?PLT 142* 142* 153  ?CREATININE 0.92 0.76 0.78  ? ? ?Estimated Creatinine Clearance: 125 mL/min (by C-G formula based on SCr of 0.78 mg/dL). ? ? ?Medications: NKDA ?Heparin Dosing Weight: 99.7kg  ?Inpatient: Lovenox 0.5mg /kg (last dose 1100 02/28) >> heparin gtt ? ?Assessment: ?Patient is a 61 y/o M with medical history including COPD / asthma and tobacco abuse who presented to the ED 2/24 with acute respiratory failure secondary to asthma / COPD exacerbation. Patient ultimately required intubation. Disposition is the ICU. Pt continues to have Afib, per d/w Cardiology will begin [facility] today. Pharmacy consulted for mgmt of heparin infusion. ? ?Date Time aPTT/HL Rate/Comment ?     ? ?Baseline Labs: ?aPTT - ordered ?INR - ordered ?Hgb - 15.9> (slow decline to 12.2 on LMWH ppx) > (heparin start) ?Plts - [phone] > (heparin start) ? ?Goal of Therapy:  ?Heparin level 0.3-0.7 units/ml ?Monitor platelets by anticoagulation protocol: Yes ?  ?Plan:  ?Pt continues in runs of Afib, stopped ppx order of LMWH & can immediately administer first doses of heparin. ?Give 5000 units bolus x1; then start heparin infusion at 1400 units/hr ?Check anti-Xa level in 6hours and daily once consecutively therapeutic. ?Continue to monitor H&H and platelets daily while on heparin gtt. ? ?Martyn Malay, PharmD, BCCP ?Clinical Pharmacist ?01/26/2022 8:45 AM ? ? ? ?

## 2022-01-26 NOTE — Plan of Care (Signed)
Sedation was decreased during the night but pt required increased sedation today due to agitation and inadequate oxygenation. Fio2 increased from 40% to 50%, and PEEP was increased to 10 with good effect. Heart rhythm currently SR with frequent PAC's and PVC's. Episode of afib with RVR to 150 today; Cardizem was increased from 10 mg per hour to 15 mg per hour. Tube feedings continue; skin remain skin remains intact. Patient does not follow commands, but intermittently did open eyes to voice and pain. ? ?Problem: Education: ?Goal: Knowledge of disease or condition will improve ?Outcome: Not Progressing ?Goal: Knowledge of the prescribed therapeutic regimen will improve ?Outcome: Not Progressing ?Goal: Individualized Educational Video(s) ?Outcome: Not Progressing ?  ?Problem: Activity: ?Goal: Ability to tolerate increased activity will improve ?Outcome: Not Progressing ?Goal: Will verbalize the importance of balancing activity with adequate rest periods ?Outcome: Not Progressing ?  ?Problem: Respiratory: ?Goal: Ability to maintain a clear airway will improve ?Outcome: Progressing ?Goal: Levels of oxygenation will improve ?Outcome: Progressing ?Goal: Ability to maintain adequate ventilation will improve ?Outcome: Progressing ?  ?Problem: Education: ?Goal: Knowledge of General Education information will improve ?Description: Including pain rating scale, medication(s)/side effects and non-pharmacologic comfort measures ?Outcome: Not Progressing ?  ?Problem: Health Behavior/Discharge Planning: ?Goal: Ability to manage health-related needs will improve ?Outcome: Not Progressing ?  ?Problem: Clinical Measurements: ?Goal: Ability to maintain clinical measurements within normal limits will improve ?Outcome: Progressing ?Goal: Will remain free from infection ?Outcome: Progressing ?Goal: Diagnostic test results will improve ?Outcome: Progressing ?Goal: Cardiovascular complication will be avoided ?Outcome: Progressing ?   ?Problem: Activity: ?Goal: Risk for activity intolerance will decrease ?Outcome: Not Progressing ?  ?Problem: Nutrition: ?Goal: Adequate nutrition will be maintained ?Outcome: Progressing ?  ?Problem: Coping: ?Goal: Level of anxiety will decrease ?Outcome: Progressing ?  ?Problem: Elimination: ?Goal: Will not experience complications related to bowel motility ?Outcome: Progressing ?Goal: Will not experience complications related to urinary retention ?Outcome: Progressing ?  ?Problem: Pain Managment: ?Goal: General experience of comfort will improve ?Outcome: Progressing ?  ?Problem: Safety: ?Goal: Ability to remain free from injury will improve ?Outcome: Progressing ?  ?Problem: Skin Integrity: ?Goal: Risk for impaired skin integrity will decrease ?Outcome: Progressing ?  ?

## 2022-01-26 NOTE — Consult Note (Signed)
ANTICOAGULATION CONSULT NOTE - Consult ? ?Pharmacy Consult for Heparin gtt ?Indication: atrial fibrillation ? ?No Known Allergies ? ?Patient Measurements: ?Height: 6' 0.01" (182.9 cm) ?Weight: 105.8 kg (233 lb 4 oz) ?IBW/kg (Calculated) : 77.62 ?Heparin Dosing Weight: 99.7kg  ? ?Vital Signs: ?Temp: 99.7 ?F (37.6 ?C) (03/01 1800) ?Temp Source: Bladder (03/01 0800) ?BP: 132/65 (03/01 1800) ?Pulse Rate: 90 (03/01 1800) ? ?Labs: ?Recent Labs  ?  01/24/22 ?0432 01/25/22 ?0312 01/26/22 ?1062 01/26/22 ?0950 01/26/22 ?1519  ?HGB 12.4* 12.2* 12.2*  --   --   ?HCT 39.3 39.6 40.1  --   --   ?PLT 142* 142* 153  --   --   ?APTT  --   --   --  27  --   ?LABPROT  --   --   --  14.6  --   ?INR  --   --   --  1.1  --   ?HEPARINUNFRC  --   --   --   --  0.12*  ?CREATININE 0.92 0.76 0.78  --   --   ? ? ? ?Estimated Creatinine Clearance: 125 mL/min (by C-G formula based on SCr of 0.78 mg/dL). ? ? ?Medications: NKDA ?Heparin Dosing Weight: 99.7kg  ?Inpatient: Lovenox 0.5mg /kg (last dose 1100 02/28) >> heparin gtt ? ?Assessment: ?Patient is a 61 y/o M with medical history including COPD / asthma and tobacco abuse who presented to the ED 2/24 with acute respiratory failure secondary to asthma / COPD exacerbation. Patient ultimately required intubation. Disposition is the ICU. Pt continues to have Afib, per d/w Cardiology will begin [facility] today. Pharmacy consulted for mgmt of heparin infusion. ? ?Date Time aPTT/HL Rate/Comment ?     ? ?Baseline Labs: ?aPTT - ordered ?INR - ordered ?Hgb - 15.9> (slow decline to 12.2 on LMWH ppx) > (heparin start) ?Plts - [phone] > (heparin start) ? ?3/1@1519 : HL 0.12, subtherapeutic ? ?Goal of Therapy:  ?Heparin level 0.3-0.7 units/ml ?Monitor platelets by anticoagulation protocol: Yes ?  ?Plan:  ?Give 3000 units bolus x1; then increase heparin infusion to 1700 units/hr ?Check anti-Xa level in 6hours and daily once consecutively therapeutic. ?Continue to monitor H&H and platelets daily while on heparin  gtt. ? ?Bettey Costa, PharmD ?Clinical Pharmacist ?01/26/2022 ?6:55 PM ? ? ? ? ?

## 2022-01-26 NOTE — Progress Notes (Signed)
NAME:  Kenneth Benson, MRN:  [id number], DOB:  1961/03/22, LOS: 5 ADMISSION DATE:  01/21/2022, CONSULTATION DATE: 01/21/2022 REFERRING MD: Dr. Cheri Fowler, CHIEF COMPLAINT: Shortness of Breath    History of Present Illness:  This is a 61 yo male who presented to [facility] ER via EMS on 02/24 with c/o shortness of breath, cough, wheezing, and congestion onset of symptoms several days prior to presentation. He reported using his inhaler without relief of symptoms.  EMS reported pt severely hypoxic with O2 sats in the 60's.  He received duoneb's x2 and 125 mg of iv solumedrol en route to the ER.  All hx obtained from chart review pt currently sedated and mechanically intubated with no family at bedside.   ED Course Upon arrival to the ER pt noted to be in moderate respiratory distress with diffuse wheezes per ED provider notes.  CXR, COVID-19/Influenza A&B, and pct negative.  VBG revealed pH 7.17/pCO2 99/bicarb 36.1.  EKG revealed sinus tachycardia hr 140 without ST elevation/depression and troponin 9.  He received duoneb x3 and 2 g of iv magnesium.  He was subsequently placed on Bipap with initial improvement of respiratory status.  However, respiratory status rapidly declined and pt required mechanical intubation.  He received an additional 2 g of iv magnesium and duoneb post intubation.  PCCM team contacted for ICU admission.  Pertinent  Medical History  COPD Current Smoker  Asthma   Significant Hospital Events: Including procedures, antibiotic start and stop dates in addition to other pertinent events   02/24: Pt admitted to ICU with acute on chronic hypercapnic hypoxic respiratory failure secondary to asthma and COPD exacerbation requiring mechanical intubation  02/25: Remains intubated, vent changes allowed 02/27: Remains intubated, slowly weaning vent support. Respiratory culture with normal respiratory flora 03/01: Cardizem and Heparin gtts initiated by Cardiology for AF. Unable to tolerate decrease  in sedation due to Tachycardia and hypoxia.  Interim History / Subjective:  -No significant events noted overnight -Afebrile, hemodynamically stable, NO vasopressors -Vent settings: FiO2 40% and PEEP 8 with O2 sats 90% ~ plan for WUA and PSV as tolerated ~ Unable to tolerate lightening of sedation due to tachycardia (HR 140's) and hypoxia (O2 sats mid 80's) -Cardizem and Heparin gtt's initiated by Cardiology -UOP 4.6 L last 24 hrs (net +5.5 L)  Objective   Blood pressure 125/66, pulse 97, temperature 99.3 F (37.4 C), resp. rate (!) 23, height 6' 0.01" (1.829 m), weight 105.8 kg, SpO2 96 %.    Vent Mode: PRVC FiO2 (%):  [40 %] 40 % Set Rate:  [20 bmp-24 bmp] 20 bmp Vt Set:  [450 mL-480 mL] 480 mL PEEP:  [8 cmH20] 8 cmH20 Plateau Pressure:  [23 cmH20-24 cmH20] 24 cmH20   During rounds was able to switch patient to PRVC: Vent Mode: PRVC FiO2 (%):  [40 %] 40 % Set Rate:  [20 bmp-24 bmp] 20 bmp Vt Set:  [450 mL-480 mL] 480 mL PEEP:  [8 cmH20] 8 cmH20 Plateau Pressure:  [23 cmH20-24 cmH20] 24 cmH20   Intake/Output Summary (Last 24 hours) at 01/26/2022 0729 Last data filed at 01/26/2022 0438 Gross per 24 hour  Intake 3612.83 ml  Output 4770 ml  Net -1157.17 ml    Filed Weights   01/24/22 0500 01/25/22 0500 01/26/22 0433  Weight: 109.4 kg 112.3 kg 105.8 kg    Examination: General: Acutely ill appearing male, sedated and mechanically ventilated, in NAD HEENT: supple, JVD present, trachea midline, no crepitus Lungs: diffuse rhonchi with inspiratory  and expiratory wheezing, even, synchronous with the vent Cardiovascular: Sinus with frequent PVC's and intermittent ventricular bigeminy, no R/G, 2+ radial/2+ distal pulses, trace bilateral upper extremity edema  Abdomen: +BS x4, obese, soft, non tender, non distended  Extremities: normal bulk and tone,  Neuro: Heavily sedated, unable to follow commands, PERRL GU: indwelling foley draining clear yellow urine   Resolved Hospital  Problem list   Acute kidney injury likely due to ATN Mild hyponatremia, hypochloremia  Assessment & Plan:   Acute on chronic hypoxic hypercapnic respiratory failure secondary to asthma and COPD exacerbation  Status asthmaticus Respiratory Panel Positive for RHINOVIRUS -Full vent support, implement lung protective strategies -Plateau pressures less than 30 cm H20 -Wean FiO2 & PEEP as tolerated to maintain O2 sats >88% -Follow intermittent Chest X-ray & ABG as needed -Spontaneous Breathing Trials when respiratory parameters met and mental status permits -Implement VAP Bundle -Aminophylline gtt d/c -Bronchodilators and Pulmicort nebs -IV Steroids, currently on 40 mg BID -Tracheal aspirate with normal respiratory flora  Mild hyperkalemia ~ RESOLVED -Monitor I&O's / urinary output -Follow BMP -Ensure adequate renal perfusion -Avoid nephrotoxic agents as able -Replace electrolytes as indicated -Pharmacy following for assistance with electrolyte replacement  Ventricular trigeminy with frequent PVC's New onset Atrial Fibrillation w/ RVR -Continuous cardiac monitoring -Maintain MAP >65 -Vasopressors as needed to maintain MAP goal -Trend lactic acid until normalized -HS Troponin normal -Echocardiogram: LVEF 60 to 65%, mild LV dilatation increase right ventricular volume overload -Cardiology following, appreciate input -Cardizem and Heparin infusions as per Cardiology  Sedation needs in the setting of mechanical ventilation -Maintain a RASS goal of -1 to -2 (requiring deeper sedation due to need for vent synchrony) -Fentanyl and Propofol as needed to maintain RASS goal -Avoid sedating medications as able -Daily wake up assessment  Hyperglycemia, likely due to critical illness and steroids -CBG's q4h; Target range of 140 to 180 -SSI -Follow ICU Hypo/Hyperglycemia protocol -Hgb A1c 4.9     Best Practice (right click and "Reselect all SmartList Selections" daily)    Diet/type: NPO, tube feeds DVT prophylaxis: Heparin gtt (anticoagulation for A-fib) GI prophylaxis: PPI Lines: Central line and yes and it is still needed Foley:  Yes, and it is still needed Code Status:  full code Last date of multidisciplinary goals of care discussion [01/26/2022]  Labs   CBC: Recent Labs  Lab 01/21/22 0544 01/22/22 0358 01/23/22 0427 01/24/22 0432 01/25/22 0312 01/26/22 0338  WBC 8.4 13.9* 15.5* 10.5 9.4 9.3  NEUTROABS 6.4  --  13.6*  --   --   --   HGB 15.9 14.3 13.6 12.4* 12.2* 12.2*  HCT 50.9 45.5 42.0 39.3 39.6 40.1  MCV 96.8 97.0 95.0 95.9 97.5 100.0  PLT 191 224 163 142* 142* 153     Basic Metabolic Panel: Recent Labs  Lab 01/22/22 0358 01/23/22 0427 01/24/22 0432 01/25/22 0312 01/25/22 1241 01/26/22 0338  NA 136 133* 140 140  --  144  K 5.0 4.7 5.0 5.2* 4.1 4.3  CL 96* 92* 97* 99  --  98  CO2 32 33* 34* 37*  --  39*  GLUCOSE 163* 140* 158* 139*  --  149*  BUN 27* 43* 27* 30*  --  39*  CREATININE 1.16 1.75* 0.92 0.76  --  0.78  CALCIUM 7.6* 8.3* 8.2* 8.3*  --  8.5*  MG 2.2 2.4 2.6* 2.4  --  2.3  PHOS 3.5 3.9 3.0 3.3  --  4.2    GFR: Estimated Creatinine Clearance: 125  mL/min (by C-G formula based on SCr of 0.78 mg/dL). Recent Labs  Lab 01/21/22 0854 01/21/22 1021 01/22/22 0358 01/23/22 0427 01/24/22 0432 01/25/22 0312 01/26/22 0338  PROCALCITON <0.10  --  <0.10 <0.10 <0.10  --   --   WBC  --   --  13.9* 15.5* 10.5 9.4 9.3  LATICACIDVEN 1.0 1.1  --   --   --   --   --      Liver Function Tests: Recent Labs  Lab 01/21/22 1130 01/22/22 0358  AST  --  26  ALT  --  33  ALKPHOS  --  40  BILITOT  --  0.6  PROT  --  6.7  ALBUMIN 4.1 3.6    No results for input(s): LIPASE, AMYLASE in the last 168 hours. No results for input(s): AMMONIA in the last 168 hours.  ABG    Component Value Date/Time   PHART 7.34 (L) 01/25/2022 1152   PCO2ART 70 (HH) 01/25/2022 1152   PO2ART 65 (L) 01/25/2022 1152   HCO3 37.8 (H)  01/25/2022 1152   O2SAT 95.2 01/25/2022 1152      Coagulation Profile: No results for input(s): INR, PROTIME in the last 168 hours.  Cardiac Enzymes: No results for input(s): CKTOTAL, CKMB, CKMBINDEX, TROPONINI in the last 168 hours.  HbA1C: Hgb A1c MFr Bld  Date/Time Value Ref Range Status  01/21/2022 02:36 PM 4.9 4.8 - 5.6 % Final    Comment:    (NOTE) Pre diabetes:          5.7%-6.4%  Diabetes:              >6.4%  Glycemic control for   <7.0% adults with diabetes     CBG: Recent Labs  Lab 01/25/22 1107 01/25/22 1538 01/25/22 1938 01/25/22 2345 01/26/22 0343  GLUCAP 120* 119* 121* 101* 136*     Review of Systems:   Unable to assess pt mechanically ventilated  Allergies No Known Allergies   Scheduled Meds:  budesonide (PULMICORT) nebulizer solution  0.25 mg Nebulization BID   chlorhexidine gluconate (MEDLINE KIT)  15 mL Mouth Rinse BID   Chlorhexidine Gluconate Cloth  6 each Topical Q0600   enoxaparin (LOVENOX) injection  0.5 mg/kg Subcutaneous Q24H   feeding supplement (PROSource TF)  90 mL Per Tube QID   feeding supplement (VITAL HIGH PROTEIN)  1,000 mL Per Tube Q24H   free water  50 mL Per Tube Q4H   insulin aspart  0-15 Units Subcutaneous Q4H   ipratropium-albuterol  3 mL Nebulization Q4H   lactulose  20 g Per Tube Daily   mouth rinse  15 mL Mouth Rinse 10 times per day   methylPREDNISolone (SOLU-MEDROL) injection  40 mg Intravenous Q12H   multivitamin  15 mL Per Tube Daily   pantoprazole (PROTONIX) IV  40 mg Intravenous Q24H   polyethylene glycol  17 g Per Tube Daily   senna-docusate  1 tablet Per Tube BID   Continuous Infusions:  sodium chloride Stopped (01/25/22 1112)   diltiazem (CARDIZEM) infusion 10 mg/hr (01/26/22 0438)   fentaNYL infusion INTRAVENOUS 200 mcg/hr (01/26/22 0454)   phenylephrine (NEO-SYNEPHRINE) Adult infusion Stopped (01/24/22 0838)   propofol (DIPRIVAN) infusion 20 mcg/kg/min (01/26/22 0438)   PRN Meds:.acetaminophen,  albuterol, docusate sodium, fentaNYL (SUBLIMAZE) injection, fentaNYL (SUBLIMAZE) injection, ondansetron (ZOFRAN) IV, polyethylene glycol   Critical care time: 40 minutes     Darel Hong, AGACNP-BC [facility] Prefer epic messenger for cross cover needs If after  hours, please call E-link

## 2022-01-26 NOTE — Progress Notes (Signed)
[facility]ULT [facility]       Patient ID: Kenneth Benson MRN: [id number] DOB/AGE: July 20, 1961 61 y.o.  Admit date: 01/21/2022 Referring Physician Dr. Donna BernardEvan Bradler Primary Physician  Primary Cardiologist  Reason for Consultation EKG changes  HPI: The patient is a 61 year old male with a past medical history notable for asthma, COPD, and current tobacco use who presented to [facility] ED in the early morning hours complaining of shortness of breath with worsening cough and congestion.  Per EMS reporting the patient's O2 sats were in the 60s and he was given 2 DuoNebs and Solu-Medrol on route.  He presented to the ED in moderate respiratory distress with diffuse wheezing and had chest tightness without chest pain.  He was given DuoNebs x3 and 2 g of IV mag and was subsequently placed on BiPAP and eventually required intubation due to worsening respiratory status.  Cardiology is consulted because his EKG changes and ventricular bigeminy.  He was started on amiodarone on 2/24 by primary team to decrease PVC burden. He had new breakthrough atrial fibrillation noted on telemetry on 2/28 and amiodarone d/c and IV diltiazem initiated for rate control.   Interval History:  - still in AF but rate controlled in the 90s during interview on diltiazem gtt.  - remains intubated   Review of systems unable to be assessed d/t patients sedation   Past Medical History:  Diagnosis Date   Asthma    Tobacco abuse     History reviewed. No pertinent surgical history.  Medications Prior to Admission  Medication Sig Dispense Refill Last Dose   albuterol (PROVENTIL HFA;VENTOLIN HFA) 108 (90 BASE) MCG/ACT inhaler Inhale 2 puffs into the lungs every 6 (six) hours as needed for wheezing. (Patient not taking: Reported on 01/21/2022)   Not Taking   ibuprofen (ADVIL,MOTRIN) 600 MG tablet Take 1 tablet (600 mg total) by mouth every 8 (eight) hours as needed. (Patient not taking: Reported on 01/21/2022) 20 tablet 0  Not Taking   Social History   Socioeconomic History   Marital status: Single    Spouse name: Not on file   Number of children: Not on file   Years of education: Not on file   Highest education level: Not on file  Occupational History   Not on file  Tobacco Use   Smoking status: Every Day   Smokeless tobacco: Not on file  Substance and Sexual Activity   Alcohol use: No   Drug use: No   Sexual activity: Not on file  Other Topics Concern   Not on file  Social History Narrative   Not on file   Social Determinants of Health   Financial Resource Strain: Not on file  Food Insecurity: Not on file  Transportation Needs: Not on file  Physical Activity: Not on file  Stress: Not on file  Social Connections: Not on file  Intimate Partner Violence: Not on file    History reviewed. No pertinent family history.    Review of systems complete and found to be negative unless listed above    PHYSICAL EXAM General: Acutely ill-appearing middle-aged Caucasian male, intubated in the ICU  HEENT:  Normocephalic and atraumatic. Neck:  No JVD.  Lungs: Intubated. Poor air movement, coarse breath sounds and expiratory wheezes somewhat improved.  Heart: irregularly irregular rhythm and controlled rate. Normal S1 and S2 without gallops or murmurs. Radial & DP pulses 2+ bilaterally. Abdomen: Soft, obese Msk: Normal tone for age. Extremities: Warm and well perfused. No clubbing,  cyanosis.  Trace pedal edema bilaterally with unna boots and SCDs in place Neuro: Sedated. Psych: Unable to assess due to sedation  Labs:   Lab Results  Component Value Date   WBC 9.3 01/26/2022   HGB 12.2 (L) 01/26/2022   HCT 40.1 01/26/2022   MCV 100.0 01/26/2022   PLT 153 01/26/2022    Recent Labs  Lab 01/22/22 0358 01/23/22 0427 01/26/22 0338  NA 136   < > 144  K 5.0   < > 4.3  CL 96*   < > 98  CO2 32   < > 39*  BUN 27*   < > 39*  CREATININE 1.16   < > 0.78  CALCIUM 7.6*   < > 8.5*  PROT 6.7  --    --   BILITOT 0.6  --   --   ALKPHOS 40  --   --   ALT 33  --   --   AST 26  --   --   GLUCOSE 163*   < > 149*   < > = values in this interval not displayed.    No results found for: CKTOTAL, CKMB, CKMBINDEX, TROPONINI No results found for: CHOL No results found for: HDL No results found for: [facility] Lab Results  Component Value Date   TRIG 71 01/25/2022   TRIG 93 01/22/2022   No results found for: CHOLHDL No results found for: LDLDIRECT    Radiology: DG Chest 1 View  Result Date: 01/21/2022 CLINICAL DATA:  Intubation EXAM: CHEST  1 VIEW COMPARISON:  None. FINDINGS: Endotracheal tube 4.7 cm from carina in good position. NG tube extends the stomach. Lungs are clear.  Normal cardiac silhouette.  No pneumothorax. IMPRESSION: 1. Support apparatus in good position. 2. No acute cardiopulmonary findings. Electronically Signed   By: Genevive Bi M.D.   On: 01/21/2022 09:19   DG Abdomen 1 View  Result Date: 01/21/2022 CLINICAL DATA:  A 61 year old male presents for evaluation of OG tube placement. EXAM: ABDOMEN - 1 VIEW COMPARISON:  Chest x-ray January 21, 2021 FINDINGS: EKG leads project over the patient's chest and abdomen. Bowel gas pattern not well assessed, abdomen incompletely imaged. Imaged focused over the lower chest and upper abdomen with OG tube tip in the gastric fundus. Side port not well delineated, potentially obscured by overlying EKG lead wires. IMPRESSION: OG tube tip in the gastric fundus. Side port not well delineated on current imaging based on overlying EKG lead wires. The tube is approximately 7-8 cm into the stomach based on projection. Electronically Signed   By: Donzetta Kohut M.D.   On: 01/21/2022 09:20   CT Angio Chest PE W/Cm &/Or Wo Cm  Result Date: 01/21/2022 CLINICAL DATA:  A 61 year old male presents for suspected pulmonary embolism, now intubated. EXAM: CT ANGIOGRAPHY CHEST WITH CONTRAST TECHNIQUE: Multidetector CT imaging of the chest was performed using  the standard protocol during bolus administration of intravenous contrast. Multiplanar CT image reconstructions and MIPs were obtained to evaluate the vascular anatomy. RADIATION DOSE REDUCTION: This exam was performed according to the departmental dose-optimization program which includes automated exposure control, adjustment of the mA and/or kV according to patient size and/or use of iterative reconstruction technique. CONTRAST:  75mL OMNIPAQUE IOHEXOL 350 MG/ML SOLN COMPARISON:  Chest x-ray of January 21, 2022. FINDINGS: Cardiovascular: Normal caliber of the thoracic aorta with calcified and noncalcified plaque. Central pulmonary vasculature is normal caliber. Main pulmonary artery attenuation is 165 Hounsfield units which limits assessment as does  the presence of respiratory motion. Accounting for limitations no signs of central or lobar embolus. No proximal segmental embolus to the extent that can be evaluated. Heart size is normal without signs of pericardial effusion. Mediastinum/Nodes: No acute findings in the mediastinum. Endotracheal tube terminates in the mid trachea. Gastric tube courses through the esophagus. No signs of adenopathy in the chest. Lungs/Pleura: No pneumothorax. No consolidation. No pleural effusion. Narrowing of mainstem bronchi suggests bronchomalacia, some dynamic assessment based on respiratory motion in the central chest on the current study with slit like appearance of bronchus intermedius. Material in lower lobe bronchi on the LEFT and to a lesser extent on the RIGHT with mild bronchial wall thickening. Upper Abdomen: Incidental imaging of upper abdominal contents without acute process related to visualized liver, pancreas, spleen, adrenal glands or kidneys. No acute gastrointestinal process to the extent evaluated. Gastric tube coiled in the stomach. Musculoskeletal: No chest wall abnormality. No acute or significant osseous findings. Review of the MIP images confirms the above  findings. IMPRESSION: 1. Limited evaluation, no signs of pulmonary embolism to the central segmental level. 2. Suspected bronchomalacia and potential early aspiration changes. No lobar consolidation or frank imaged itis at this time. 3. Aortic atherosclerosis. 4. Gastric tube coiled in the stomach. Aortic Atherosclerosis (ICD10-I70.0). Electronically Signed   By: Donzetta Kohut M.D.   On: 01/21/2022 10:27   DG Chest Port 1 View  Result Date: 01/25/2022 CLINICAL DATA:  Acute respiratory failure and hypoxia. EXAM: PORTABLE CHEST 1 VIEW COMPARISON:  Chest radiograph dated 01/23/2022. FINDINGS: Support apparatus similar in position. No focal consolidation, pleural effusion, pneumothorax. The cardiac silhouette is within normal limits. No acute osseous pathology. IMPRESSION: No active disease. Electronically Signed   By: Elgie Collard M.D.   On: 01/25/2022 02:42   DG Chest Port 1 View  Result Date: 01/23/2022 CLINICAL DATA:  Ventilator dependent respiratory failure. EXAM: PORTABLE CHEST 1 VIEW COMPARISON:  Portable chest yesterday at 3:46 p.m. FINDINGS: 5:14 a.m., 01/23/2022. Left IJ central line remains in the upper SVC. ETT tip is 3.7 cm from the carina. NGT is partially coiled in the stomach. There is mild cardiomegaly, normal caliber central vessels. Stable mediastinum with aortic atherosclerosis. There is increased retrocardiac/infrahilar left lower lobe opacity which could be due to atelectasis, pneumonia or aspiration. Bronchial thickening is also increased centrally. No pleural effusion is seen.  There is thoracic spondylosis. IMPRESSION: Increased left lower lobe opacity consistent with atelectasis, pneumonia or aspiration, with increased findings of central bronchitis. Remaining lung fields are clear. Electronically Signed   By: Almira Bar M.D.   On: 01/23/2022 07:11   DG Chest Port 1 View  Result Date: 01/22/2022 CLINICAL DATA:  Replacement of endotracheal tube EXAM: PORTABLE CHEST 1 VIEW  COMPARISON:  Previous studies including the examination done earlier today FINDINGS: Tip of endotracheal tube is approximately 5.6 cm above the carina. Tip of enteric tube is seen in the fundus of the stomach. Transverse diameter of heart is in the upper limits of normal. There are no signs of pulmonary edema or focal pulmonary consolidation. There is no pleural effusion. Apices are not included in their entirety limiting evaluation for minimal pneumothorax. IMPRESSION: There are no new infiltrates or signs of pulmonary edema. Electronically Signed   By: Ernie Avena M.D.   On: 01/22/2022 16:15   DG Chest Port 1 View  Result Date: 01/22/2022 CLINICAL DATA:  Follow-up for respiratory failure. EXAM: PORTABLE CHEST 1 VIEW COMPARISON:  01/21/2022 and older studies. FINDINGS: Cardiac  silhouette normal in size. Clear lungs.  No pneumothorax. Endotracheal tube, left internal jugular central venous line and nasal/orogastric tube are stable. IMPRESSION: 1. No acute cardiopulmonary disease. 2. No change from the previous day's study. Electronically Signed   By: Amie Portland M.D.   On: 01/22/2022 09:24   DG Chest Port 1 View  Result Date: 01/21/2022 CLINICAL DATA:  Status post central line placement EXAM: PORTABLE CHEST 1 VIEW COMPARISON:  AP chest 01/21/2022 earlier same day at 904 hours FINDINGS: 01/21/2022 at 1215 hours Left internal jugular central venous catheter tip overlies the junction of the left brachiocephalic vein and superior vena cava. Enteric tube descends below the diaphragm with the tip overlying the gastric fundus in the left upper quadrant. Cardiac silhouette and mediastinal contours are unchanged and within normal limits with mild calcification seen within the aortic arch. The lungs are clear.  No pleural effusion or pneumothorax. Moderate multilevel degenerative bridging osteophytes of the thoracic spine. IMPRESSION: New left internal jugular central venous catheter tip overlies the  junction of the left brachiocephalic vein and superior vena cava. No acute lung process. Electronically Signed   By: Neita Garnet M.D.   On: 01/21/2022 12:36   DG Chest Portable 1 View  Result Date: 01/21/2022 CLINICAL DATA:  61 year old male with history of shortness of breath. EXAM: PORTABLE CHEST 1 VIEW COMPARISON:  Chest x-ray 10/11/2015. FINDINGS: Lung volumes are normal. No consolidative airspace disease. No pleural effusions. No pneumothorax. No pulmonary nodule or mass noted. Pulmonary vasculature and the cardiomediastinal silhouette are within normal limits. IMPRESSION: No radiographic evidence of acute cardiopulmonary disease. Electronically Signed   By: Trudie Reed M.D.   On: 01/21/2022 06:11   ECHOCARDIOGRAM COMPLETE  Result Date: 01/21/2022    ECHOCARDIOGRAM REPORT   Patient Name:   Kenneth Benson [facility] Date of Exam: 01/21/2022 Medical Rec #:  [id number]     Height:       72.0 in Accession #:    [id number]    Weight:       236.3 lb Date of Birth:  [birth year]    BSA:          2.287 m Patient Age:    60 years      BP:           110/67 mmHg Patient Gender: M             HR:           84 bpm. Exam Location:  [facility] Procedure: 2D Echo, Cardiac Doppler and Color Doppler Indications:     Acute respiratory distress R06.03  History:         Patient has no prior history of Echocardiogram examinations.                  Tobacco abuse.  Sonographer:     Cristela Blue Referring Phys:  [id number] CARMEN L GONZALEZ Diagnosing Phys: Alwyn Pea MD  Sonographer Comments: Suboptimal apical window, suboptimal parasternal window and echo performed with patient supine and on artificial respirator. IMPRESSIONS  1. Left ventricular ejection fraction, by estimation, is 60 to 65%. The left ventricle has normal function. The left ventricle has no regional wall motion abnormalities. The left ventricular internal cavity size was mildly dilated. Left ventricular diastolic parameters were normal. There is the interventricular septum  is flattened in diastole ('D' shaped left ventricle), consistent with right ventricular volume overload.  2. Right ventricular systolic function is normal. The right ventricular size is normal.  3. The mitral valve is normal in structure. Trivial mitral valve regurgitation.  4. The aortic valve is normal in structure. Aortic valve regurgitation is not visualized. FINDINGS  Left Ventricle: Left ventricular ejection fraction, by estimation, is 60 to 65%. The left ventricle has normal function. The left ventricle has no regional wall motion abnormalities. The left ventricular internal cavity size was mildly dilated. There is  borderline concentric left ventricular hypertrophy. The interventricular septum is flattened in diastole ('D' shaped left ventricle), consistent with right ventricular volume overload. Left ventricular diastolic parameters were normal. Right Ventricle: The right ventricular size is normal. No increase in right ventricular wall thickness. Right ventricular systolic function is normal. Left Atrium: Left atrial size was normal in size. Right Atrium: Right atrial size was normal in size. Pericardium: There is no evidence of pericardial effusion. Mitral Valve: The mitral valve is normal in structure. Trivial mitral valve regurgitation. Tricuspid Valve: The tricuspid valve is normal in structure. Tricuspid valve regurgitation is trivial. Aortic Valve: The aortic valve is normal in structure. Aortic valve regurgitation is not visualized. Aortic valve mean gradient measures 2.0 mmHg. Aortic valve peak gradient measures 2.8 mmHg. Aortic valve area, by VTI measures 3.82 cm. Pulmonic Valve: The pulmonic valve was normal in structure. Pulmonic valve regurgitation is trivial. Aorta: The ascending aorta was not well visualized. IAS/Shunts: No atrial level shunt detected by color flow Doppler.  LEFT VENTRICLE PLAX 2D LVIDd:         5.10 cm   Diastology LVIDs:         3.30 cm   LV e' medial:    10.80 cm/s LV PW:          1.10 cm   LV E/e' medial:  7.5 LV IVS:        0.90 cm   LV e' lateral:   6.53 cm/s LVOT diam:     2.30 cm   LV E/e' lateral: 12.3 LV SV:         52 LV SV Index:   23 LVOT Area:     4.15 cm  RIGHT VENTRICLE RV Basal diam:  3.40 cm RV S prime:     7.94 cm/s TAPSE (M-mode): 2.4 cm LEFT ATRIUM             Index        RIGHT ATRIUM           Index LA diam:        3.40 cm 1.49 cm/m   RA Area:     16.20 cm LA Vol (A2C):   36.9 ml 16.13 ml/m  RA Volume:   40.10 ml  17.53 ml/m LA Vol (A4C):   31.2 ml 13.64 ml/m LA Biplane Vol: 34.4 ml 15.04 ml/m  AORTIC VALVE                    PULMONIC VALVE AV Area (Vmax):    4.10 cm     PV Vmax:        0.75 m/s AV Area (Vmean):   3.30 cm     PV Vmean:       45.800 cm/s AV Area (VTI):     3.82 cm     PV VTI:         0.131 m AV Vmax:           83.20 cm/s   PV Peak grad:   2.3 mmHg AV Vmean:  59.200 cm/s  PV Mean grad:   1.0 mmHg AV VTI:            0.137 m      RVOT Peak grad: 3 mmHg AV Peak Grad:      2.8 mmHg AV Mean Grad:      2.0 mmHg LVOT Vmax:         82.20 cm/s LVOT Vmean:        47.000 cm/s LVOT VTI:          0.126 m LVOT/AV VTI ratio: 0.92  AORTA Ao Root diam: 3.50 cm MITRAL VALVE MV Area (PHT): 3.42 cm    SHUNTS MV Decel Time: 222 msec    Systemic VTI:  0.13 m MV E velocity: 80.60 cm/s  Systemic Diam: 2.30 cm MV A velocity: 58.30 cm/s  Pulmonic VTI:  0.163 m MV E/A ratio:  1.38 Kenneth D Callwood MD Electronically signed by Alwyn Peawayne D Callwood MD Signature Date/Time: 01/21/2022/3:53:12 PM    Final     ECHO EF 60-65% with mild LV dilation, and RV overload without significant valvular pathologies.   TELEMETRY reviewed by me: afib rate 90s   EKG reviewed by me: initial EKG at 0538 showed sinus tach with rate of 140 and premature atrial complexes, repeat EKG at 0730 showed sinus tach with somewhat improved rate at 103 with PVCs frequently, most recent EKG at 08 54 showed sinus rhythm with ventricular bigeminy rate 98  ASSESSMENT AND PLAN:  The patient is  a 61 year old male with a past medical history notable for asthma, COPD, and current tobacco use who presented to [facility] ED in the early morning hours complaining of shortness of breath with worsening cough and congestion.  Per EMS reporting the patient's O2 sats were in the 60s and he was given 2 DuoNebs and Solu-Medrol on route.  He presented to the ED in moderate respiratory distress with diffuse wheezing and had chest tightness without chest pain.  He was given DuoNebs x3 and 2 g of IV mag and was subsequently placed on BiPAP and eventually required intubation due to worsening respiratory status.  Cardiology is consulted because his EKG changes and ventricular bigeminy.  He was started on amiodarone on 2/24 by primary team to decrease PVC burden. He had new breakthrough atrial fibrillation noted on telemetry on 2/28 and amiodarone d/c and IV diltiazem initiated for rate control.   #new onset atrial fibrillation with RVR #Frequent PVCs/ventricular bigeminy -s/p amiodarone bolus and gtt from 2/24-2/28 with breakthrough new onset AF 2/28 -s/p diltiazem bolus and continue dilt gtt for rate control. Remains in AF but rate controlled. -Continuous monitoring on telemetry -Recommend monitoring of electrolytes and replete in mag and potassium as needed -Echo resulted with preserved EF 60-65% with mild LV dilation, and RV overload without significant valvular pathologies.  -CHADSVASC 0 from information available via chart review.  -adding anticoagulation with heparin gtt this morning as pt remains in AF in anticipation of potential need for cardioversion in the future  #Acute hypoxic respiratory failure 2/2 COPD/asthma exacerbation #Tobacco abuse #Aortic atherosclerosis on CTA chest -Agree with current therapy and supportive care for COPD/asthma exacerbation per PCCM -As needed Lasix for volume overload, s/p 20 mg IV Lasix x2 -Recommend outpatient follow-up with cardiology for risk factor modification with  aortic atherosclerosis on CT imaging on admission -Agree with smoking cessation therapy in the future  This patient's plan of care was discussed and created with Dr. Marcina MillardAlexander Paraschos and he is in agreement.  Signed: Rebeca Allegra , PA-C 01/26/2022, 9:36 AM [facility]

## 2022-01-26 NOTE — Progress Notes (Addendum)
Nutrition Follow-up ? ?DOCUMENTATION CODES:  ? ?Obesity unspecified ? ?INTERVENTION:  ? ?TF via OGT:  ? ?Vital High Protein @ 45 ml/hr ? ?90 ml Prosource TF TID ? ?50 ml free water flush every 4 hours ? ?Tube feeding regimen provides 1320 kcal, 160 grams of protein, and 903 ml of H2O. Total free water: 1203 ml daily ? ?TF + propofol provides 1563 kcals ? ?NUTRITION DIAGNOSIS:  ? ?Inadequate oral intake related to inability to eat (pt sedated and ventilated) as evidenced by NPO status. ? ?Ongoing ? ?GOAL:  ? ?Provide needs based on ASPEN/SCCM guidelines ? ?Met with TF ? ?MONITOR:  ? ?Vent status, Labs, Weight trends, Skin, I & O's ? ?REASON FOR ASSESSMENT:  ? ?Ventilator ?  ? ?ASSESSMENT:  ? ?61 y/o male with h/o asthma, COPD and currently smoker who is admitted with severe COPD exacerbation. ? ?Patient is currently intubated on ventilator support ?MV: 10.4 L/min ?Temp (24hrs), Avg:99.3 ?F (37.4 ?C), Min:98.8 ?F (37.1 ?C), Max:99.9 ?F (37.7 ?C) ? ?Propofol: 9.19 ml/hr (provides 243 kcals)  ? ?MAP: 84 ? ?Reviewed I/O's: -1.1 L x 24 hours and +5.6 L since admission ? ?UOP: 4.7 L x 24 hours ? ?Case discussed with MD, RN, and during ICU rounds. Pt with lots of secretions. Pt is not ready for extubation. Per MD, pt may require trach.  ? ?Medications reviewed and include senokot, miralax, cardizem,  lactulose and solu-medrol.  ? ?Labs reviewed: CBGS: 101-136 (inpatient orders for glycemic control are none).   ? ?Diet Order:   ?Diet Order   ? ?       ?  Diet NPO time specified  Diet effective now       ?  ? ?  ?  ? ?  ? ? ?EDUCATION NEEDS:  ? ?No education needs have been identified at this time ? ?Skin:  Skin Assessment: Reviewed RN Assessment ? ?Last BM:  Unknown ? ?Height:  ? ?Ht Readings from Last 1 Encounters:  ?01/26/22 6' 0.01" (1.829 m)  ? ? ?Weight:  ? ?Wt Readings from Last 1 Encounters:  ?01/26/22 105.8 kg  ? ? ?Ideal Body Weight:  80.9 kg ? ?BMI:  Body mass index is 31.63 kg/m?. ? ?Estimated Nutritional Needs:   ? ?Kcal:  2820-8138 ? ?Protein:  >160g/day ? ?Fluid:  2.4-2.7L/day ? ? ? ?Loistine Chance, RD, LDN, CDCES ?Registered Dietitian II ?Certified Diabetes Care and Education Specialist ?Please refer to [facility] for RD and/or RD on-call/weekend/after hours pager  ?

## 2022-01-26 NOTE — Consult Note (Signed)
PHARMACY CONSULT NOTE ? ?Pharmacy Consult for Electrolyte Monitoring and Replacement  ? ?Recent Labs: ?Potassium (mmol/L)  ?Date Value  ?01/26/2022 4.3  ? ?Magnesium (mg/dL)  ?Date Value  ?01/26/2022 2.3  ? ?Calcium (mg/dL)  ?Date Value  ?01/26/2022 8.5 (L)  ? ?Albumin (g/dL)  ?Date Value  ?01/22/2022 3.6  ? ?Phosphorus (mg/dL)  ?Date Value  ?01/26/2022 4.2  ? ?Sodium (mmol/L)  ?Date Value  ?01/26/2022 144  ? ?Assessment: ?Patient is a 61 y/o M with medical history including COPD / asthma and tobacco abuse who presented to the ED 2/24 with acute respiratory failure secondary to asthma / COPD exacerbation. Patient ultimately required intubation. Disposition is the ICU. Pharmacy consulted to assist with electrolyte monitoring and replacement as indicated. ? ?Nutrition: Vital HP @20 >17ml/hr + ProSource TF 83ml QID via tube  ?Free water flushes 124ml>50mL q4 hours ? ?Phos: 3>3.3>4.2 ?Na: 140>140>144 ? ?Goal of Therapy:  ?Electrolytes within normal limits ? ?Plan:  ?K: 5.2>4.3; pt 5.2L net positive, UOP 1.1>1.8 (in response to 20mg  lasix yesterday) ?--No electrolyte replacement indicated at this time. ?--Follow-up electrolytes with AM labs  ? ?Lorna Dibble, PharmD, BCCP ?Clinical Pharmacist ?01/26/2022 8:48 AM ? ?

## 2022-01-27 ENCOUNTER — Inpatient Hospital Stay: Payer: Self-pay

## 2022-01-27 LAB — GLUCOSE, CAPILLARY
Glucose-Capillary: 139 mg/dL — ABNORMAL HIGH (ref 70–99)
Glucose-Capillary: 152 mg/dL — ABNORMAL HIGH (ref 70–99)
Glucose-Capillary: 113 mg/dL — ABNORMAL HIGH (ref 70–99)
Glucose-Capillary: 130 mg/dL — ABNORMAL HIGH (ref 70–99)
Glucose-Capillary: 116 mg/dL — ABNORMAL HIGH (ref 70–99)
Glucose-Capillary: 144 mg/dL — ABNORMAL HIGH (ref 70–99)

## 2022-01-27 LAB — BASIC METABOLIC PANEL
Anion gap: 3 — ABNORMAL LOW (ref 5–15)
Anion gap: 3 — ABNORMAL LOW (ref 5–15)
BUN: 42 mg/dL — ABNORMAL HIGH (ref 6–20)
BUN: 39 mg/dL — ABNORMAL HIGH (ref 6–20)
CO2: 39 mmol/L — ABNORMAL HIGH (ref 22–32)
CO2: 37 mmol/L — ABNORMAL HIGH (ref 22–32)
Calcium: 8.5 mg/dL — ABNORMAL LOW (ref 8.9–10.3)
Calcium: 8.6 mg/dL — ABNORMAL LOW (ref 8.9–10.3)
Chloride: 101 mmol/L (ref 98–111)
Chloride: 103 mmol/L (ref 98–111)
Creatinine, Ser: 0.82 mg/dL (ref 0.61–1.24)
Creatinine, Ser: 0.73 mg/dL (ref 0.61–1.24)
GFR, Estimated: >60 mL/min (ref >60)
GFR, Estimated: >60 mL/min (ref >60)
Glucose, Bld: 155 mg/dL — ABNORMAL HIGH (ref 70–99)
Glucose, Bld: 156 mg/dL — ABNORMAL HIGH (ref 70–99)
Potassium: 4.2 mmol/L (ref 3.5–5.1)
Potassium: 4.2 mmol/L (ref 3.5–5.1)
Sodium: 143 mmol/L (ref 135–145)
Sodium: 143 mmol/L (ref 135–145)

## 2022-01-27 LAB — CBC
HCT: 40.7 % (ref 39.0–52.0)
Hemoglobin: 12.3 g/dL — ABNORMAL LOW (ref 13.0–17.0)
MCH: 30.3 pg (ref 26.0–34.0)
MCHC: 30.2 g/dL (ref 30.0–36.0)
MCV: 100.2 fL — ABNORMAL HIGH (ref 80.0–100.0)
Platelets: 127 10*3/uL — ABNORMAL LOW (ref 150–400)
RBC: 4.06 MIL/uL — ABNORMAL LOW (ref 4.22–5.81)
RDW: 13.9 % (ref 11.5–15.5)
WBC: 10 10*3/uL (ref 4.0–10.5)
nRBC: 0 % (ref 0.0–0.2)

## 2022-01-27 LAB — PHOSPHORUS: Phosphorus: 3.6 mg/dL (ref 2.5–4.6)

## 2022-01-27 LAB — HEPARIN LEVEL (UNFRACTIONATED)
Heparin Unfractionated: 0.15 IU/mL — ABNORMAL LOW (ref 0.30–0.70)
Heparin Unfractionated: 0.28 IU/mL — ABNORMAL LOW (ref 0.30–0.70)

## 2022-01-27 LAB — MAGNESIUM
Magnesium: 2.3 mg/dL (ref 1.7–2.4)
Magnesium: 2.2 mg/dL (ref 1.7–2.4)

## 2022-01-27 MED ORDER — METOPROLOL TARTRATE 5 MG/5ML IV SOLN
INTRAVENOUS | Status: AC
  Administered 2022-01-27: 5 mg via INTRAVENOUS
  Filled 2022-01-27: qty 5

## 2022-01-27 MED ORDER — METOLAZONE 5 MG PO TABS
5.0000 mg | ORAL_TABLET | Freq: Once | ORAL | Status: AC
  Administered 2022-01-27: 5 mg
  Filled 2022-01-27: qty 1

## 2022-01-27 MED ORDER — METHYLPREDNISOLONE SODIUM SUCC 125 MG IJ SOLR
80.0000 mg | Freq: Every day | INTRAMUSCULAR | Status: DC
  Administered 2022-01-27 – 2022-01-28 (×2): 80 mg via INTRAVENOUS
  Filled 2022-01-27 (×2): qty 2

## 2022-01-27 MED ORDER — METOPROLOL TARTRATE 5 MG/5ML IV SOLN: 5.0000 mg | Freq: Once | INTRAVENOUS | Status: AC

## 2022-01-27 MED ORDER — PROSOURCE TF PO LIQD
45.0000 mL | Freq: Three times a day (TID) | ORAL | Status: DC
  Administered 2022-01-28 – 2022-02-02 (×15): 45 mL
  Filled 2022-01-27 (×18): qty 45

## 2022-01-27 MED ORDER — AMIODARONE HCL IN DEXTROSE 360-4.14 MG/200ML-% IV SOLN
30.0000 mg/h | INTRAVENOUS | Status: DC
  Administered 2022-01-28: 30 mg/h via INTRAVENOUS
  Filled 2022-01-27: qty 200

## 2022-01-27 MED ORDER — ACETAZOLAMIDE 250 MG PO TABS
500.0000 mg | ORAL_TABLET | Freq: Once | ORAL | Status: AC
  Administered 2022-01-27: 500 mg
  Filled 2022-01-27: qty 2

## 2022-01-27 MED ORDER — ENOXAPARIN SODIUM 100 MG/ML IJ SOSY
1.0000 mg/kg | PREFILLED_SYRINGE | Freq: Two times a day (BID) | INTRAMUSCULAR | Status: DC
  Administered 2022-01-27 – 2022-02-11 (×31): 100 mg via SUBCUTANEOUS
  Filled 2022-01-27 (×33): qty 1

## 2022-01-27 MED ORDER — AMIODARONE HCL IN DEXTROSE 360-4.14 MG/200ML-% IV SOLN: 30.0000 mg/h | INTRAVENOUS | Status: DC

## 2022-01-27 MED ORDER — MIDAZOLAM HCL 2 MG/2ML IJ SOLN
2.0000 mg | INTRAMUSCULAR | Status: DC | PRN
  Administered 2022-01-27 – 2022-02-03 (×15): 2 mg via INTRAVENOUS
  Filled 2022-01-27 (×15): qty 2

## 2022-01-27 MED ORDER — HEPARIN BOLUS VIA INFUSION
3000.0000 [IU] | Freq: Once | INTRAVENOUS | Status: AC
  Administered 2022-01-27: 3000 [IU] via INTRAVENOUS
  Filled 2022-01-27: qty 3000

## 2022-01-27 MED ORDER — VITAL HIGH PROTEIN PO LIQD
1000.0000 mL | ORAL | Status: DC
  Administered 2022-01-27 – 2022-02-01 (×4): 1000 mL

## 2022-01-27 NOTE — Progress Notes (Signed)
NAME:  Kenneth Benson, MRN:  [id number], DOB:  September 13, 1961, LOS: 6 ADMISSION DATE:  01/21/2022, CONSULTATION DATE: 01/21/2022 REFERRING MD: Dr. Cheri Fowler, CHIEF COMPLAINT: Shortness of Breath    History of Present Illness:  This is a 61 yo male who presented to [facility] ER via EMS on 02/24 with c/o shortness of breath, cough, wheezing, and congestion onset of symptoms several days prior to presentation. He reported using his inhaler without relief of symptoms.  EMS reported pt severely hypoxic with O2 sats in the 60's.  He received duoneb's x2 and 125 mg of iv solumedrol en route to the ER.  All hx obtained from chart review pt currently sedated and mechanically intubated with no family at bedside.   ED Course Upon arrival to the ER pt noted to be in moderate respiratory distress with diffuse wheezes per ED provider notes.  CXR, COVID-19/Influenza A&B, and pct negative.  VBG revealed pH 7.17/pCO2 99/bicarb 36.1.  EKG revealed sinus tachycardia hr 140 without ST elevation/depression and troponin 9.  He received duoneb x3 and 2 g of iv magnesium.  He was subsequently placed on Bipap with initial improvement of respiratory status.  However, respiratory status rapidly declined and pt required mechanical intubation.  He received an additional 2 g of iv magnesium and duoneb post intubation.  PCCM team contacted for ICU admission.  Pertinent  Medical History  COPD Current Smoker  Asthma   Significant Hospital Events: Including procedures, antibiotic start and stop dates in addition to other pertinent events   02/24: Pt admitted to ICU with acute on chronic hypercapnic hypoxic respiratory failure secondary to asthma and COPD exacerbation requiring mechanical intubation  02/25: Remains intubated, vent changes allowed 02/27: Remains intubated, slowly weaning vent support. Respiratory culture with normal respiratory flora 03/01: Cardizem and Heparin gtts initiated by Cardiology for AF. Unable to tolerate decrease  in sedation due to Tachycardia and hypoxia. 03/02: Sedation lightened for WUA, delirious but able to follow simple commands.  CXR concerning for possible pulmonary edema, diuresing with Diamox + Metolazone.  Heparin gtt changed to treatment dose Lovenox  Interim History / Subjective:  -No significant events noted overnight -Afebrile, hemodynamically stable, NO vasopressors -Vent settings: FiO2 50% and PEEP 10 with O2 sats 90%  -No plan for SBT today given increased vent settings, however WUA assessment performed ~ pt delirious but able to follow simple commands ~ was placed back on sedation due to increased work of breathing and tachycardia (not ready for extubation) -Cardizem and Heparin gtt's as per Cardiology -Creatinine normal, UOP 2.3 L last 24 hrs (net +3.3 L since admit) -CXR concerning for possible pulmonary edema ~ discussed with Dr. Mortimer Fries, will diurese with Diamox + Metolazone  Objective   Blood pressure 132/67, pulse (!) 43, temperature 98.8 F (37.1 C), resp. rate 20, height 6' 0.01" (1.829 m), weight 100.4 kg, SpO2 97 %.    Vent Mode: PRVC FiO2 (%):  [40 %-50 %] 50 % Set Rate:  [20 bmp] 20 bmp Vt Set:  [480 mL] 480 mL PEEP:  [10 cmH20] 10 cmH20 Plateau Pressure:  [16 cmH20] 16 cmH20   During rounds was able to switch patient to PRVC: Vent Mode: PRVC FiO2 (%):  [40 %-50 %] 50 % Set Rate:  [20 bmp] 20 bmp Vt Set:  [480 mL] 480 mL PEEP:  [10 cmH20] 10 cmH20 Plateau Pressure:  [16 cmH20] 16 cmH20   Intake/Output Summary (Last 24 hours) at 01/27/2022 0943 Last data filed at 01/27/2022 0530 Gross  per 24 hour  Intake 453.97 ml  Output 1975 ml  Net -1521.03 ml    Filed Weights   01/25/22 0500 01/26/22 0433 01/27/22 0500  Weight: 112.3 kg 105.8 kg 100.4 kg    Examination: General: Acutely ill appearing male, sedated and mechanically ventilated, in NAD HEENT: supple, JVD present, trachea midline, no crepitus Lungs: diffuse rhonchi with inspiratory and expiratory  wheezing, even, synchronous with the vent Cardiovascular: Sinus with frequent PVC's and intermittent ventricular bigeminy, no R/G, 2+ radial/2+ distal pulses, trace bilateral upper extremity edema  Abdomen: +BS x4, obese, soft, non tender, non distended  Extremities: normal bulk and tone,  Neuro: Sedated, when sedation lightened able to follow simple commands, no focal deficits, PERRL GU: indwelling foley draining clear yellow urine   Resolved Hospital Problem list   Acute kidney injury likely due to ATN Mild hyponatremia, hypochloremia  Assessment & Plan:   Acute on chronic hypoxic hypercapnic respiratory failure secondary to asthma and COPD exacerbation  Status asthmaticus Respiratory Panel Positive for RHINOVIRUS -Full vent support, implement lung protective strategies -Plateau pressures less than 30 cm H20 -Wean FiO2 & PEEP as tolerated to maintain O2 sats >88% -Follow intermittent Chest X-ray & ABG as needed -Spontaneous Breathing Trials when respiratory parameters met and mental status permits -Implement VAP Bundle -Aminophylline gtt d/c -Bronchodilators and Pulmicort nebs -IV Steroids, currently on 40 mg BID -Tracheal aspirate with normal respiratory flora  Mild hyperkalemia ~ RESOLVED -Monitor I&O's / urinary output -Follow BMP -Ensure adequate renal perfusion -Avoid nephrotoxic agents as able -Replace electrolytes as indicated -Pharmacy following for assistance with electrolyte replacement  Ventricular trigeminy with frequent PVC's New onset Atrial Fibrillation w/ RVR -Continuous cardiac monitoring -Maintain MAP >65 -Vasopressors as needed to maintain MAP goal -Trend lactic acid until normalized -HS Troponin normal -Echocardiogram: LVEF 60 to 65%, mild LV dilatation increase right ventricular volume overload -Cardiology following, appreciate input -Cardizem and Heparin infusions as per Cardiology ~ plan to transition Heparin gtt to treatment dose  Lovenox  Sedation needs in the setting of mechanical ventilation -Maintain a RASS goal of -1 to -2 (requiring deeper sedation due to need for vent synchrony) -Fentanyl and Propofol as needed to maintain RASS goal -Avoid sedating medications as able -Daily wake up assessment  Hyperglycemia, likely due to critical illness and steroids -CBG's q4h; Target range of 140 to 180 -SSI -Follow ICU Hypo/Hyperglycemia protocol -Hgb A1c 4.9     Best Practice (right click and "Reselect all SmartList Selections" daily)   Diet/type: NPO, tube feeds DVT prophylaxis: Heparin gtt (anticoagulation for A-fib) GI prophylaxis: PPI Lines: Central line and yes and it is still needed Foley:  Yes, and it is still needed Code Status:  full code Last date of multidisciplinary goals of care discussion [01/27/2022]  Labs   CBC: Recent Labs  Lab 01/21/22 0544 01/22/22 0358 01/23/22 0427 01/24/22 0432 01/25/22 0312 01/26/22 0338 01/27/22 0359  WBC 8.4   < > 15.5* 10.5 9.4 9.3 10.0  NEUTROABS 6.4  --  13.6*  --   --   --   --   HGB 15.9   < > 13.6 12.4* 12.2* 12.2* 12.3*  HCT 50.9   < > 42.0 39.3 39.6 40.1 40.7  MCV 96.8   < > 95.0 95.9 97.5 100.0 100.2*  PLT 191   < > 163 142* 142* 153 127*   < > = values in this interval not displayed.     Basic Metabolic Panel: Recent Labs  Lab 01/23/22 [phone]  01/24/22 0432 01/25/22 0312 01/25/22 1241 01/26/22 0338 01/27/22 0359  NA 133* 140 140  --  144 143  K 4.7 5.0 5.2* 4.1 4.3 4.2  CL 92* 97* 99  --  98 101  CO2 33* 34* 37*  --  39* 39*  GLUCOSE 140* 158* 139*  --  149* 155*  BUN 43* 27* 30*  --  39* 42*  CREATININE 1.75* 0.92 0.76  --  0.78 0.82  CALCIUM 8.3* 8.2* 8.3*  --  8.5* 8.5*  MG 2.4 2.6* 2.4  --  2.3 2.3  PHOS 3.9 3.0 3.3  --  4.2 3.6    GFR: Estimated Creatinine Clearance: 118.9 mL/min (by C-G formula based on SCr of 0.82 mg/dL). Recent Labs  Lab 01/21/22 0854 01/21/22 1021 01/22/22 0358 01/23/22 0427 01/24/22 0432  01/25/22 0312 01/26/22 0338 01/27/22 0359  PROCALCITON <0.10  --  <0.10 <0.10 <0.10  --   --   --   WBC  --   --  13.9* 15.5* 10.5 9.4 9.3 10.0  LATICACIDVEN 1.0 1.1  --   --   --   --   --   --      Liver Function Tests: Recent Labs  Lab 01/21/22 1130 01/22/22 0358  AST  --  26  ALT  --  33  ALKPHOS  --  40  BILITOT  --  0.6  PROT  --  6.7  ALBUMIN 4.1 3.6    No results for input(s): LIPASE, AMYLASE in the last 168 hours. No results for input(s): AMMONIA in the last 168 hours.  ABG    Component Value Date/Time   PHART 7.34 (L) 01/25/2022 1152   PCO2ART 70 (HH) 01/25/2022 1152   PO2ART 65 (L) 01/25/2022 1152   HCO3 37.8 (H) 01/25/2022 1152   O2SAT 95.2 01/25/2022 1152      Coagulation Profile: Recent Labs  Lab 01/26/22 0950  INR 1.1    Cardiac Enzymes: No results for input(s): CKTOTAL, CKMB, CKMBINDEX, TROPONINI in the last 168 hours.  HbA1C: Hgb A1c MFr Bld  Date/Time Value Ref Range Status  01/21/2022 02:36 PM 4.9 4.8 - 5.6 % Final    Comment:    (NOTE) Pre diabetes:          5.7%-6.4%  Diabetes:              >6.4%  Glycemic control for   <7.0% adults with diabetes     CBG: Recent Labs  Lab 01/26/22 1538 01/26/22 1914 01/26/22 2315 01/27/22 0336 01/27/22 0808  GLUCAP 125* 129* 128* 139* 152*     Review of Systems:   Unable to assess pt mechanically ventilated  Allergies No Known Allergies   Scheduled Meds:  budesonide (PULMICORT) nebulizer solution  0.25 mg Nebulization BID   chlorhexidine gluconate (MEDLINE KIT)  15 mL Mouth Rinse BID   Chlorhexidine Gluconate Cloth  6 each Topical Q0600   feeding supplement (PROSource TF)  90 mL Per Tube QID   feeding supplement (VITAL HIGH PROTEIN)  1,000 mL Per Tube Q24H   free water  50 mL Per Tube Q4H   insulin aspart  0-15 Units Subcutaneous Q4H   ipratropium-albuterol  3 mL Nebulization Q4H   lactulose  20 g Per Tube Daily   mouth rinse  15 mL Mouth Rinse 10 times per day    methylPREDNISolone (SOLU-MEDROL) injection  40 mg Intravenous Q12H   multivitamin  15 mL Per Tube Daily   pantoprazole (PROTONIX) IV  40 mg Intravenous Q24H   polyethylene glycol  17 g Per Tube Daily   senna-docusate  1 tablet Per Tube BID   Continuous Infusions:  sodium chloride Stopped (01/25/22 1112)   diltiazem (CARDIZEM) infusion 15 mg/hr (01/27/22 0629)   fentaNYL infusion INTRAVENOUS 175 mcg/hr (01/27/22 0630)   heparin 2,100 Units/hr (01/27/22 0935)   phenylephrine (NEO-SYNEPHRINE) Adult infusion Stopped (01/24/22 2440)   propofol (DIPRIVAN) infusion 25 mcg/kg/min (01/27/22 0527)   PRN Meds:.acetaminophen, albuterol, docusate sodium, fentaNYL (SUBLIMAZE) injection, fentaNYL (SUBLIMAZE) injection, ondansetron (ZOFRAN) IV, polyethylene glycol   Critical care time: 40 minutes     Darel Hong, AGACNP-BC [facility] Prefer epic messenger for cross cover needs If after hours, please call E-link

## 2022-01-27 NOTE — Progress Notes (Signed)
Patient HR persistent 130-140s, Afib/flutter mix, s/p 5mg  Lopressor administration. Unable to administer more Lopressor due to soft Bps. Cardizem max rate of 15mg /hr infusing. Elink contacted for further orders.  ?

## 2022-01-27 NOTE — Plan of Care (Signed)

## 2022-01-27 NOTE — Progress Notes (Signed)
Nutrition Brief Follow Up Note  ? ?Propofol change  ? ?INTERVENTION:  ? ?Increase Vital HP to 43ml/hr + ProSource TF 57ml TID via tube  ? ?Free water flushes 67ml q4 hours ? ?Regimen provides 1560kcal/day, 159g/day protein and 1583ml/day of free water  ? ?Estimated Nutritional Needs:  ? ?Kcal:  4540-9811 ? ?Protein:  >160g/day ? ?Fluid:  2.4-2.7L/day ? ?Betsey Holiday MS, RD, LDN ?Please refer to AMION for RD and/or RD on-call/weekend/after hours pager ? ?

## 2022-01-27 NOTE — Consult Note (Signed)
PHARMACY CONSULT NOTE ? ?Pharmacy Consult for Electrolyte Monitoring and Replacement  ? ?Recent Labs: ?Potassium (mmol/L)  ?Date Value  ?01/27/2022 4.2  ? ?Magnesium (mg/dL)  ?Date Value  ?01/27/2022 2.3  ? ?Calcium (mg/dL)  ?Date Value  ?01/27/2022 8.5 (L)  ? ?Albumin (g/dL)  ?Date Value  ?01/22/2022 3.6  ? ?Phosphorus (mg/dL)  ?Date Value  ?01/27/2022 3.6  ? ?Sodium (mmol/L)  ?Date Value  ?01/27/2022 143  ? ?Assessment: ?Patient is a 61 y/o M with medical history including COPD / asthma and tobacco abuse who presented to the ED 2/24 with acute respiratory failure secondary to asthma / COPD exacerbation. Patient ultimately required intubation. Disposition is the ICU. Pharmacy consulted to assist with electrolyte monitoring and replacement as indicated. ? ?Nutrition: Vital HP at 20>1ml/hr + ProSource TF 40ml QID via tube  ?Free water flushes 118ml>50mL q4 hours ? ?Goal of Therapy:  ?Electrolytes within normal limits ? ?Plan:  ?--No electrolyte replacement indicated at this time. ?--Follow-up electrolytes with AM labs  ? ?Tressie Ellis  ? ?01/27/2022 8:10 AM ? ?

## 2022-01-27 NOTE — Progress Notes (Signed)
[facility]       Patient ID: Kenneth Benson MRN: [id number] DOB/AGE: 60/03/1961 60 y.o.  Admit date: 01/21/2022 Referring Physician Dr. Donna BernardEvan Bradler Primary Physician  Primary Cardiologist  Reason for Consultation EKG changes  HPI: The patient is a 61 year old male with a past medical history notable for asthma, COPD, and current tobacco use who [facility] in the early morning hours complaining of shortness of breath with worsening cough and congestion.  Per EMS reporting the patient's O2 sats were in the 60s and he was given 2 DuoNebs and Solu-Medrol on route.  He presented to the ED in moderate respiratory distress with diffuse wheezing and had chest tightness without chest pain.  He was given DuoNebs x3 and 2 g of IV mag and was subsequently placed on BiPAP and eventually required intubation due to worsening respiratory status.  Cardiology is consulted because his EKG changes and ventricular bigeminy.  He was started on amiodarone on 2/24 by primary team to decrease PVC burden. He had new breakthrough atrial fibrillation noted on telemetry on 2/28 and amiodarone d/c and IV diltiazem initiated for rate control.   Interval History:  - spontaneously converted back to sinus rhythm / ventricular bigeminy this morning  - unable to tolerate weaning of sedation yesterday due to hypoxia and tachycardia reaching 140s -remains intubated  Review of systems unable to be assessed d/t patients sedation   Past Medical History:  Diagnosis Date   Asthma    Tobacco abuse     History reviewed. No pertinent surgical history.  Medications Prior to Admission  Medication Sig Dispense Refill Last Dose   albuterol (PROVENTIL HFA;VENTOLIN HFA) 108 (90 BASE) MCG/ACT inhaler Inhale 2 puffs into the lungs every 6 (six) hours as needed for wheezing. (Patient not taking: Reported on 01/21/2022)   Not Taking   ibuprofen (ADVIL,MOTRIN) 600 MG tablet Take 1 tablet (600 mg total)  by mouth every 8 (eight) hours as needed. (Patient not taking: Reported on 01/21/2022) 20 tablet 0 Not Taking   Social History   Socioeconomic History   Marital status: Single    Spouse name: Not on file   Number of children: Not on file   Years of education: Not on file   Highest education level: Not on file  Occupational History   Not on file  Tobacco Use   Smoking status: Every Day   Smokeless tobacco: Not on file  Substance and Sexual Activity   Alcohol use: No   Drug use: No   Sexual activity: Not on file  Other Topics Concern   Not on file  Social History Narrative   Not on file   Social Determinants of Health   Financial Resource Strain: Not on file  Food Insecurity: Not on file  Transportation Needs: Not on file  Physical Activity: Not on file  Stress: Not on file  Social Connections: Not on file  Intimate Partner Violence: Not on file    History reviewed. No pertinent family history.    Review of systems complete and found to be negative unless listed above    PHYSICAL EXAM General: Acutely ill-appearing middle-aged Caucasian male, intubated in the ICU  HEENT:  Normocephalic and atraumatic. Neck:  No JVD.  Lungs: Intubated. Poor air movement, coarse breath sounds and without wheezes  Heart: normal rhythm with frequent premature beats and controlled rate. Normal S1 and S2 without gallops or murmurs. Radial & DP pulses 2+ bilaterally. Abdomen: Soft, obese Msk:  Normal tone for age. Extremities: Warm and well perfused. No clubbing, cyanosis.  Trace pedal edema bilaterally with unna boots and SCDs in place Neuro: Sedated. Psych: Unable to assess due to sedation  Labs:   Lab Results  Component Value Date   WBC 10.0 01/27/2022   HGB 12.3 (L) 01/27/2022   HCT 40.7 01/27/2022   MCV 100.2 (H) 01/27/2022   PLT 127 (L) 01/27/2022    Recent Labs  Lab 01/22/22 0358 01/23/22 0427 01/27/22 0359  NA 136   < > 143  K 5.0   < > 4.2  CL 96*   < > 101  CO2 32    < > 39*  BUN 27*   < > 42*  CREATININE 1.16   < > 0.82  CALCIUM 7.6*   < > 8.5*  PROT 6.7  --   --   BILITOT 0.6  --   --   ALKPHOS 40  --   --   ALT 33  --   --   AST 26  --   --   GLUCOSE 163*   < > 155*   < > = values in this interval not displayed.    No results found for: CKTOTAL, CKMB, CKMBINDEX, TROPONINI No results found for: CHOL No results found for: HDL No results found for: [name] Lab Results  Component Value Date   TRIG 71 01/25/2022   TRIG 93 01/22/2022   No results found for: CHOLHDL No results found for: LDLDIRECT    Radiology: DG Chest 1 View  Result Date: 01/21/2022 CLINICAL DATA:  Intubation EXAM: CHEST  1 VIEW COMPARISON:  None. FINDINGS: Endotracheal tube 4.7 cm from carina in good position. NG tube extends the stomach. Lungs are clear.  Normal cardiac silhouette.  No pneumothorax. IMPRESSION: 1. Support apparatus in good position. 2. No acute cardiopulmonary findings. Electronically Signed   By: Genevive Bi M.D.   On: 01/21/2022 09:19   DG Abdomen 1 View  Result Date: 01/21/2022 CLINICAL DATA:  A 61 year old male presents for evaluation of OG tube placement. EXAM: ABDOMEN - 1 VIEW COMPARISON:  Chest x-ray January 21, 2021 FINDINGS: EKG leads project over the patient's chest and abdomen. Bowel gas pattern not well assessed, abdomen incompletely imaged. Imaged focused over the lower chest and upper abdomen with OG tube tip in the gastric fundus. Side port not well delineated, potentially obscured by overlying EKG lead wires. IMPRESSION: OG tube tip in the gastric fundus. Side port not well delineated on current imaging based on overlying EKG lead wires. The tube is approximately 7-8 cm into the stomach based on projection. Electronically Signed   By: Donzetta Kohut M.D.   On: 01/21/2022 09:20   CT Angio Chest PE W/Cm &/Or Wo Cm  Result Date: 01/21/2022 CLINICAL DATA:  A 61 year old male presents for suspected pulmonary embolism, now intubated. EXAM: CT  ANGIOGRAPHY CHEST WITH CONTRAST TECHNIQUE: Multidetector CT imaging of the chest was performed using the standard protocol during bolus administration of intravenous contrast. Multiplanar CT image reconstructions and MIPs were obtained to evaluate the vascular anatomy. RADIATION DOSE REDUCTION: This exam was performed according to the departmental dose-optimization program which includes automated exposure control, adjustment of the mA and/or kV according to patient size and/or use of iterative reconstruction technique. CONTRAST:  74mL OMNIPAQUE IOHEXOL 350 MG/ML SOLN COMPARISON:  Chest x-ray of January 21, 2022. FINDINGS: Cardiovascular: Normal caliber of the thoracic aorta with calcified and noncalcified plaque. Central pulmonary vasculature is normal caliber.  Main pulmonary artery attenuation is 165 Hounsfield units which limits assessment as does the presence of respiratory motion. Accounting for limitations no signs of central or lobar embolus. No proximal segmental embolus to the extent that can be evaluated. Heart size is normal without signs of pericardial effusion. Mediastinum/Nodes: No acute findings in the mediastinum. Endotracheal tube terminates in the mid trachea. Gastric tube courses through the esophagus. No signs of adenopathy in the chest. Lungs/Pleura: No pneumothorax. No consolidation. No pleural effusion. Narrowing of mainstem bronchi suggests bronchomalacia, some dynamic assessment based on respiratory motion in the central chest on the current study with slit like appearance of bronchus intermedius. Material in lower lobe bronchi on the LEFT and to a lesser extent on the RIGHT with mild bronchial wall thickening. Upper Abdomen: Incidental imaging of upper abdominal contents without acute process related to visualized liver, pancreas, spleen, adrenal glands or kidneys. No acute gastrointestinal process to the extent evaluated. Gastric tube coiled in the stomach. Musculoskeletal: No chest wall  abnormality. No acute or significant osseous findings. Review of the MIP images confirms the above findings. IMPRESSION: 1. Limited evaluation, no signs of pulmonary embolism to the central segmental level. 2. Suspected bronchomalacia and potential early aspiration changes. No lobar consolidation or frank imaged itis at this time. 3. Aortic atherosclerosis. 4. Gastric tube coiled in the stomach. Aortic Atherosclerosis (ICD10-I70.0). Electronically Signed   By: Donzetta Kohut M.D.   On: 01/21/2022 10:27   DG Chest Port 1 View  Result Date: 01/27/2022 CLINICAL DATA:  Acute respiratory failure, hypercarbia EXAM: PORTABLE CHEST 1 VIEW COMPARISON:  01/25/2022 FINDINGS: Endotracheal tube 4.4 cm above the carina. Nasogastric tube extends into the gastric fundus. Left internal jugular central venous catheter tip at the superior vena caval confluence. Pulmonary insufflation is normal and symmetric. Superimposed diffuse interstitial pulmonary infiltrate has progressed in the interval since prior examination suggestive of diffuse pulmonary edema given its relatively rapid development. Central pulmonary arterial enlargement appears more prominent. Cardiac size is at the upper limits of normal. No pneumothorax. Small left pleural effusion. IMPRESSION: Interval development of progressive central pulmonary artery enlargement, diffuse interstitial pulmonary infiltrate, and small left pleural effusion all suggesting changes of mild to moderate cardiogenic failure. Borderline cardiomegaly. Stable support lines and tubes. Electronically Signed   By: Helyn Numbers M.D.   On: 01/27/2022 02:45   DG Chest Port 1 View  Result Date: 01/25/2022 CLINICAL DATA:  Acute respiratory failure and hypoxia. EXAM: PORTABLE CHEST 1 VIEW COMPARISON:  Chest radiograph dated 01/23/2022. FINDINGS: Support apparatus similar in position. No focal consolidation, pleural effusion, pneumothorax. The cardiac silhouette is within normal limits. No acute  osseous pathology. IMPRESSION: No active disease. Electronically Signed   By: Elgie Collard M.D.   On: 01/25/2022 02:42   DG Chest Port 1 View  Result Date: 01/23/2022 CLINICAL DATA:  Ventilator dependent respiratory failure. EXAM: PORTABLE CHEST 1 VIEW COMPARISON:  Portable chest yesterday at 3:46 p.m. FINDINGS: 5:14 a.m., 01/23/2022. Left IJ central line remains in the upper SVC. ETT tip is 3.7 cm from the carina. NGT is partially coiled in the stomach. There is mild cardiomegaly, normal caliber central vessels. Stable mediastinum with aortic atherosclerosis. There is increased retrocardiac/infrahilar left lower lobe opacity which could be due to atelectasis, pneumonia or aspiration. Bronchial thickening is also increased centrally. No pleural effusion is seen.  There is thoracic spondylosis. IMPRESSION: Increased left lower lobe opacity consistent with atelectasis, pneumonia or aspiration, with increased findings of central bronchitis. Remaining lung fields are clear.  Electronically Signed   By: Almira Bar M.D.   On: 01/23/2022 07:11   DG Chest Port 1 View  Result Date: 01/22/2022 CLINICAL DATA:  Replacement of endotracheal tube EXAM: PORTABLE CHEST 1 VIEW COMPARISON:  Previous studies including the examination done earlier today FINDINGS: Tip of endotracheal tube is approximately 5.6 cm above the carina. Tip of enteric tube is seen in the fundus of the stomach. Transverse diameter of heart is in the upper limits of normal. There are no signs of pulmonary edema or focal pulmonary consolidation. There is no pleural effusion. Apices are not included in their entirety limiting evaluation for minimal pneumothorax. IMPRESSION: There are no new infiltrates or signs of pulmonary edema. Electronically Signed   By: Ernie Avena M.D.   On: 01/22/2022 16:15   DG Chest Port 1 View  Result Date: 01/22/2022 CLINICAL DATA:  Follow-up for respiratory failure. EXAM: PORTABLE CHEST 1 VIEW COMPARISON:   01/21/2022 and older studies. FINDINGS: Cardiac silhouette normal in size. Clear lungs.  No pneumothorax. Endotracheal tube, left internal jugular central venous line and nasal/orogastric tube are stable. IMPRESSION: 1. No acute cardiopulmonary disease. 2. No change from the previous day's study. Electronically Signed   By: Amie Portland M.D.   On: 01/22/2022 09:24   DG Chest Port 1 View  Result Date: 01/21/2022 CLINICAL DATA:  Status post central line placement EXAM: PORTABLE CHEST 1 VIEW COMPARISON:  AP chest 01/21/2022 earlier same day at 904 hours FINDINGS: 01/21/2022 at 1215 hours Left internal jugular central venous catheter tip overlies the junction of the left brachiocephalic vein and superior vena cava. Enteric tube descends below the diaphragm with the tip overlying the gastric fundus in the left upper quadrant. Cardiac silhouette and mediastinal contours are unchanged and within normal limits with mild calcification seen within the aortic arch. The lungs are clear.  No pleural effusion or pneumothorax. Moderate multilevel degenerative bridging osteophytes of the thoracic spine. IMPRESSION: New left internal jugular central venous catheter tip overlies the junction of the left brachiocephalic vein and superior vena cava. No acute lung process. Electronically Signed   By: Neita Garnet M.D.   On: 01/21/2022 12:36   DG Chest Portable 1 View  Result Date: 01/21/2022 CLINICAL DATA:  61 year old male with history of shortness of breath. EXAM: PORTABLE CHEST 1 VIEW COMPARISON:  Chest x-ray 10/11/2015. FINDINGS: Lung volumes are normal. No consolidative airspace disease. No pleural effusions. No pneumothorax. No pulmonary nodule or mass noted. Pulmonary vasculature and the cardiomediastinal silhouette are within normal limits. IMPRESSION: No radiographic evidence of acute cardiopulmonary disease. Electronically Signed   By: Trudie Reed M.D.   On: 01/21/2022 06:11   ECHOCARDIOGRAM COMPLETE  Result  Date: 01/21/2022    ECHOCARDIOGRAM REPORT   Patient Name:   KRISTAIN HU [facility] Date of Exam: 01/21/2022 Medical Rec #:  [id number]     Height:       72.0 in Accession #:    [id number]    Weight:       236.3 lb Date of Birth:  07/30/1961    BSA:          2.287 m Patient Age:    60 years      BP:           110/67 mmHg Patient Gender: M             HR:           84 bpm. Exam Location:  [facility] Procedure: 2D Echo, Cardiac Doppler  and Color Doppler Indications:     Acute respiratory distress R06.03  History:         Patient has no prior history of Echocardiogram examinations.                  Tobacco abuse.  Sonographer:     Cristela BlueJerry Hege Referring Phys:  [id number] CARMEN L GONZALEZ Diagnosing Phys: Alwyn Peawayne D Callwood MD  Sonographer Comments: Suboptimal apical window, suboptimal parasternal window and echo performed with patient supine and on artificial respirator. IMPRESSIONS  1. Left ventricular ejection fraction, by estimation, is 60 to 65%. The left ventricle has normal function. The left ventricle has no regional wall motion abnormalities. The left ventricular internal cavity size was mildly dilated. Left ventricular diastolic parameters were normal. There is the interventricular septum is flattened in diastole ('D' shaped left ventricle), consistent with right ventricular volume overload.  2. Right ventricular systolic function is normal. The right ventricular size is normal.  3. The mitral valve is normal in structure. Trivial mitral valve regurgitation.  4. The aortic valve is normal in structure. Aortic valve regurgitation is not visualized. FINDINGS  Left Ventricle: Left ventricular ejection fraction, by estimation, is 60 to 65%. The left ventricle has normal function. The left ventricle has no regional wall motion abnormalities. The left ventricular internal cavity size was mildly dilated. There is  borderline concentric left ventricular hypertrophy. The interventricular septum is flattened in diastole ('D' shaped left  ventricle), consistent with right ventricular volume overload. Left ventricular diastolic parameters were normal. Right Ventricle: The right ventricular size is normal. No increase in right ventricular wall thickness. Right ventricular systolic function is normal. Left Atrium: Left atrial size was normal in size. Right Atrium: Right atrial size was normal in size. Pericardium: There is no evidence of pericardial effusion. Mitral Valve: The mitral valve is normal in structure. Trivial mitral valve regurgitation. Tricuspid Valve: The tricuspid valve is normal in structure. Tricuspid valve regurgitation is trivial. Aortic Valve: The aortic valve is normal in structure. Aortic valve regurgitation is not visualized. Aortic valve mean gradient measures 2.0 mmHg. Aortic valve peak gradient measures 2.8 mmHg. Aortic valve area, by VTI measures 3.82 cm. Pulmonic Valve: The pulmonic valve was normal in structure. Pulmonic valve regurgitation is trivial. Aorta: The ascending aorta was not well visualized. IAS/Shunts: No atrial level shunt detected by color flow Doppler.  LEFT VENTRICLE PLAX 2D LVIDd:         5.10 cm   Diastology LVIDs:         3.30 cm   LV e' medial:    10.80 cm/s LV PW:         1.10 cm   LV E/e' medial:  7.5 LV IVS:        0.90 cm   LV e' lateral:   6.53 cm/s LVOT diam:     2.30 cm   LV E/e' lateral: 12.3 LV SV:         52 LV SV Index:   23 LVOT Area:     4.15 cm  RIGHT VENTRICLE RV Basal diam:  3.40 cm RV S prime:     7.94 cm/s TAPSE (M-mode): 2.4 cm LEFT ATRIUM             Index        RIGHT ATRIUM           Index LA diam:        3.40 cm 1.49 cm/m   RA Area:  16.20 cm LA Vol (A2C):   36.9 ml 16.13 ml/m  RA Volume:   40.10 ml  17.53 ml/m LA Vol (A4C):   31.2 ml 13.64 ml/m LA Biplane Vol: 34.4 ml 15.04 ml/m  AORTIC VALVE                    PULMONIC VALVE AV Area (Vmax):    4.10 cm     PV Vmax:        0.75 m/s AV Area (Vmean):   3.30 cm     PV Vmean:       45.800 cm/s AV Area (VTI):     3.82 cm      PV VTI:         0.131 m AV Vmax:           83.20 cm/s   PV Peak grad:   2.3 mmHg AV Vmean:          59.200 cm/s  PV Mean grad:   1.0 mmHg AV VTI:            0.137 m      RVOT Peak grad: 3 mmHg AV Peak Grad:      2.8 mmHg AV Mean Grad:      2.0 mmHg LVOT Vmax:         82.20 cm/s LVOT Vmean:        47.000 cm/s LVOT VTI:          0.126 m LVOT/AV VTI ratio: 0.92  AORTA Ao Root diam: 3.50 cm MITRAL VALVE MV Area (PHT): 3.42 cm    SHUNTS MV Decel Time: 222 msec    Systemic VTI:  0.13 m MV E velocity: 80.60 cm/s  Systemic Diam: 2.30 cm MV A velocity: 58.30 cm/s  Pulmonic VTI:  0.163 m MV E/A ratio:  1.38 Dwayne D Callwood MD Electronically signed by Alwyn Pea MD Signature Date/Time: 01/21/2022/3:53:12 PM    Final     ECHO EF 60-65% with mild LV dilation, and RV overload without significant valvular pathologies.   TELEMETRY reviewed by me: afib rate 90s   EKG reviewed by me: initial EKG at 0538 showed sinus tach with rate of 140 and premature atrial complexes, repeat EKG at 0730 showed sinus tach with somewhat improved rate at 103 with PVCs frequently, most recent EKG at 08 54 showed sinus rhythm with ventricular bigeminy rate 98  ASSESSMENT AND PLAN:  The patient is a 61 year old male with a past medical history notable for asthma, COPD, and current tobacco use who presented to [facility] ED in the early morning hours complaining of shortness of breath with worsening cough and congestion.  Per EMS reporting the patient's O2 sats were in the 60s and he was given 2 DuoNebs and Solu-Medrol on route.  He presented to the ED in moderate respiratory distress with diffuse wheezing and had chest tightness without chest pain.  He was given DuoNebs x3 and 2 g of IV mag and was subsequently placed on BiPAP and eventually required intubation due to worsening respiratory status.  Cardiology is consulted because his EKG changes and ventricular bigeminy.  He was started on amiodarone on 2/24 by primary team to decrease PVC  burden. He had new breakthrough atrial fibrillation noted on telemetry on 2/28 and amiodarone d/c and IV diltiazem initiated for rate control. Back in NSR spontaneously on 3/2  #new onset atrial fibrillation with RVR, spontaneously back in NSR 3/2 #Frequent PVCs/ventricular bigeminy -s/p amiodarone bolus and  gtt from 2/24-2/28 with breakthrough new onset AF 2/28, back in NSR 3/2  -s/p diltiazem bolus and continue dilt gtt for rate control and transition to PO cardizem once extubated -Recommend monitoring of electrolytes and replete in mag and potassium as needed -Echo resulted with preserved EF 60-65% with mild LV dilation, and RV overload without significant valvular pathologies.  -CHADSVASC 0 from information available via chart review.  -continue anticoagulation with heparin gtt for now.   #Acute hypoxic respiratory failure 2/2 COPD/asthma exacerbation #Tobacco abuse #Aortic atherosclerosis on CTA chest -Agree with current therapy and supportive care for COPD/asthma exacerbation per PCCM -agree with diuresis as needed with significant volume given  -Recommend outpatient follow-up with cardiology for risk factor modification with aortic atherosclerosis on CT imaging on admission -Agree with smoking cessation therapy in the future  This patient's plan of care was discussed and created with Dr. Marcina Millard and he is in agreement.  Signed: Rebeca Allegra , PA-C 01/27/2022, 2:19 PM [facility]

## 2022-01-27 NOTE — Progress Notes (Signed)
eLink Physician-Brief Progress Note ?Patient Name: Kenneth Benson ?DOB: 1961-04-24 ?MRN: [id number] ? ? ?Date of Service ? 01/27/2022  ?HPI/Events of Note ? AFRVR to 140s. On dilt gtt @15  ?Given lopressor at 9pm   ?eICU Interventions ? Repeat lopressor 5 mg with HR 110s now ?If refractory, recommend amio bolus  ? ? ? ?Intervention Category ?Intermediate Interventions: Arrhythmia - evaluation and management ? ?[name] ?01/27/2022, 10:43 PM ?

## 2022-01-27 NOTE — Progress Notes (Signed)
I was unable to reach any family members or significant other in last few days ?Will obtain Palliative care consultation to assist with process as patient will mos likely need TRACH and possible PEG tube placement for survival ?

## 2022-01-27 NOTE — Progress Notes (Signed)
PHARMACIST - PHYSICIAN COMMUNICATION ?  ?CONCERNING: Methylprednisolone IV  ?  ?Current order: Methylprednisolone IV 40 mg BID ?  ?  ?DESCRIPTION: ?Per [place] Protocol:  ? ?IV methylprednisolone will be converted to either a q12h or q24h frequency with the same total daily dose (TDD). ? ?Ordered Dose: 1 to 125 mg TDD; convert to: TDD q24h.  ?Ordered Dose: 126 to 250 mg TDD; convert to: TDD div q12h.  ?Ordered Dose: >250 mg TDD; DAW. ? ?Order has been adjusted to: Methylprednisolone IV 80 mg daily ? ?Tressie Ellis ?01/27/2022 11:29 AM  ?

## 2022-01-27 NOTE — Consult Note (Signed)
ANTICOAGULATION CONSULT NOTE - Consult ? ?Pharmacy Consult for Heparin gtt ?Indication: atrial fibrillation ? ?No Known Allergies ? ?Patient Measurements: ?Height: 6' 0.01" (182.9 cm) ?Weight: 105.8 kg (233 lb 4 oz) ?IBW/kg (Calculated) : 77.62 ?Heparin Dosing Weight: 99.7kg  ? ?Vital Signs: ?Temp: 98.8 ?F (37.1 ?C) (03/02 0100) ?Temp Source: Bladder (03/01 2000) ?BP: 134/68 (03/02 0000) ?Pulse Rate: 75 (03/02 0100) ? ?Labs: ?Recent Labs  ?  01/24/22 ?0432 01/25/22 ?0312 01/26/22 ?9373 01/26/22 ?0950 01/26/22 ?1519 01/27/22 ?0117  ?HGB 12.4* 12.2* 12.2*  --   --   --   ?HCT 39.3 39.6 40.1  --   --   --   ?PLT 142* 142* 153  --   --   --   ?APTT  --   --   --  27  --   --   ?LABPROT  --   --   --  14.6  --   --   ?INR  --   --   --  1.1  --   --   ?HEPARINUNFRC  --   --   --   --  0.12* 0.15*  ?CREATININE 0.92 0.76 0.78  --   --   --   ? ? ? ?Estimated Creatinine Clearance: 125 mL/min (by C-G formula based on SCr of 0.78 mg/dL). ? ? ?Medications: NKDA ?Heparin Dosing Weight: 99.7kg  ?Inpatient: Lovenox 0.5mg /kg (last dose 1100 02/28) >> heparin gtt ? ?Assessment: ?Patient is a 61 y/o M with medical history including COPD / asthma and tobacco abuse who presented to the ED 2/24 with acute respiratory failure secondary to asthma / COPD exacerbation. Patient ultimately required intubation. Disposition is the ICU. Pt continues to have Afib, per d/w Cardiology will begin [facility] today. Pharmacy consulted for mgmt of heparin infusion. ? ?Date Time aPTT/HL Rate/Comment ?     ? ?Baseline Labs: ?aPTT - ordered ?INR - ordered ?Hgb - 15.9> (slow decline to 12.2 on LMWH ppx) > (heparin start) ?Plts - 281 246 1579 > (heparin start) ? ?3/1@1519 : HL 0.12, subtherapeutic ? ?Goal of Therapy:  ?Heparin level 0.3-0.7 units/ml ?Monitor platelets by anticoagulation protocol: Yes ?  ?Plan:  ?3/2:  HL @ 0117 = 0.15, subtherapeutic  ?Will order heparin 3000 units IV X 1 and increase drip rate to 2100 units/hr.  ?Will recheck HL 6 hrs after rate  change.  ? ?Nikia Levels D, PharmD ?Clinical Pharmacist ?01/27/2022 ?1:54 AM ? ? ? ? ?

## 2022-01-28 ENCOUNTER — Inpatient Hospital Stay: Payer: Self-pay

## 2022-01-28 LAB — GLUCOSE, CAPILLARY
Glucose-Capillary: 129 mg/dL — ABNORMAL HIGH (ref 70–99)
Glucose-Capillary: 121 mg/dL — ABNORMAL HIGH (ref 70–99)
Glucose-Capillary: 135 mg/dL — ABNORMAL HIGH (ref 70–99)
Glucose-Capillary: 210 mg/dL — ABNORMAL HIGH (ref 70–99)
Glucose-Capillary: 151 mg/dL — ABNORMAL HIGH (ref 70–99)
Glucose-Capillary: 142 mg/dL — ABNORMAL HIGH (ref 70–99)

## 2022-01-28 LAB — CBC WITH DIFFERENTIAL/PLATELET
Abs Immature Granulocytes: 0.29 10*3/uL — ABNORMAL HIGH (ref 0.00–0.07)
Basophils Absolute: 0 10*3/uL (ref 0.0–0.1)
Basophils Relative: 0 %
Eosinophils Absolute: 0 10*3/uL (ref 0.0–0.5)
Eosinophils Relative: 0 %
HCT: 41.1 % (ref 39.0–52.0)
Hemoglobin: 12.6 g/dL — ABNORMAL LOW (ref 13.0–17.0)
Immature Granulocytes: 3 %
Lymphocytes Relative: 7 %
Lymphs Abs: 0.6 10*3/uL — ABNORMAL LOW (ref 0.7–4.0)
MCH: 30.6 pg (ref 26.0–34.0)
MCHC: 30.7 g/dL (ref 30.0–36.0)
MCV: 99.8 fL (ref 80.0–100.0)
Monocytes Absolute: 0.9 10*3/uL (ref 0.1–1.0)
Monocytes Relative: 10 %
Neutro Abs: 7.1 10*3/uL (ref 1.7–7.7)
Neutrophils Relative %: 80 %
Platelets: 176 10*3/uL (ref 150–400)
RBC: 4.12 MIL/uL — ABNORMAL LOW (ref 4.22–5.81)
RDW: 13.9 % (ref 11.5–15.5)
WBC: 8.9 10*3/uL (ref 4.0–10.5)
nRBC: 0 % (ref 0.0–0.2)

## 2022-01-28 LAB — BASIC METABOLIC PANEL
Anion gap: 5 (ref 5–15)
BUN: 39 mg/dL — ABNORMAL HIGH (ref 6–20)
CO2: 35 mmol/L — ABNORMAL HIGH (ref 22–32)
Calcium: 8.6 mg/dL — ABNORMAL LOW (ref 8.9–10.3)
Chloride: 101 mmol/L (ref 98–111)
Creatinine, Ser: 0.74 mg/dL (ref 0.61–1.24)
GFR, Estimated: >60 mL/min (ref >60)
Glucose, Bld: 137 mg/dL — ABNORMAL HIGH (ref 70–99)
Potassium: 4.2 mmol/L (ref 3.5–5.1)
Sodium: 141 mmol/L (ref 135–145)

## 2022-01-28 LAB — CBC
HCT: 40.7 % (ref 39.0–52.0)
Hemoglobin: 12.5 g/dL — ABNORMAL LOW (ref 13.0–17.0)
MCH: 30.3 pg (ref 26.0–34.0)
MCHC: 30.7 g/dL (ref 30.0–36.0)
MCV: 98.8 fL (ref 80.0–100.0)
Platelets: 166 10*3/uL (ref 150–400)
RBC: 4.12 MIL/uL — ABNORMAL LOW (ref 4.22–5.81)
RDW: 13.8 % (ref 11.5–15.5)
WBC: 9.2 10*3/uL (ref 4.0–10.5)
nRBC: 0 % (ref 0.0–0.2)

## 2022-01-28 LAB — TRIGLYCERIDES: Triglycerides: 56 mg/dL (ref <150)

## 2022-01-28 MED ORDER — IPRATROPIUM-ALBUTEROL 0.5-2.5 (3) MG/3ML IN SOLN
3.0000 mL | RESPIRATORY_TRACT | Status: AC
  Administered 2022-01-29: 3 mL via RESPIRATORY_TRACT
  Filled 2022-01-28: qty 3

## 2022-01-28 MED ORDER — IPRATROPIUM-ALBUTEROL 0.5-2.5 (3) MG/3ML IN SOLN
3.0000 mL | RESPIRATORY_TRACT | Status: DC | PRN
  Filled 2022-01-28: qty 3

## 2022-01-28 MED ORDER — VANCOMYCIN HCL 1500 MG/300ML IV SOLN
1500.0000 mg | Freq: Once | INTRAVENOUS | Status: AC
  Administered 2022-01-28: 1500 mg via INTRAVENOUS
  Filled 2022-01-28: qty 300

## 2022-01-28 MED ORDER — DILTIAZEM HCL 60 MG PO TABS: 60.0000 mg | ORAL_TABLET | Freq: Four times a day (QID) | ORAL | Status: DC

## 2022-01-28 MED ORDER — FUROSEMIDE 10 MG/ML IJ SOLN
40.0000 mg | Freq: Once | INTRAMUSCULAR | Status: AC
Start: 2022-01-28 — End: 2022-01-28
  Administered 2022-01-28: 40 mg via INTRAVENOUS
  Filled 2022-01-28: qty 4

## 2022-01-28 MED ORDER — ACETAMINOPHEN 160 MG/5ML PO SOLN
650.0000 mg | Freq: Four times a day (QID) | ORAL | Status: DC | PRN
  Administered 2022-01-29 – 2022-02-04 (×6): 650 mg
  Filled 2022-01-28 (×8): qty 20.3

## 2022-01-28 MED ORDER — PREDNISONE 10 MG PO TABS: 20.0000 mg | ORAL_TABLET | Freq: Every day | ORAL | Status: DC

## 2022-01-28 MED ORDER — SODIUM CHLORIDE 0.9 % IV SOLN
2.0000 g | Freq: Three times a day (TID) | INTRAVENOUS | Status: DC
  Administered 2022-01-28 – 2022-01-30 (×6): 2 g via INTRAVENOUS
  Filled 2022-01-28 (×8): qty 2

## 2022-01-28 MED ORDER — CHLORHEXIDINE GLUCONATE 0.12% ORAL RINSE (MEDLINE KIT)
15.0000 mL | Freq: Two times a day (BID) | OROMUCOSAL | Status: DC
  Administered 2022-01-28 – 2022-02-13 (×26): 15 mL via OROMUCOSAL

## 2022-01-28 MED ORDER — CHLORHEXIDINE GLUCONATE CLOTH 2 % EX PADS
6.0000 | MEDICATED_PAD | Freq: Every day | CUTANEOUS | Status: DC
  Administered 2022-01-28 – 2022-02-11 (×13): 6 via TOPICAL

## 2022-01-28 MED ORDER — VANCOMYCIN HCL IN DEXTROSE 1-5 GM/200ML-% IV SOLN
1000.0000 mg | Freq: Once | INTRAVENOUS | Status: AC
  Administered 2022-01-28: 1000 mg via INTRAVENOUS
  Filled 2022-01-28: qty 200

## 2022-01-28 MED ORDER — REVEFENACIN 175 MCG/3ML IN SOLN
175.0000 ug | Freq: Every day | RESPIRATORY_TRACT | Status: DC
Start: 2022-01-29 — End: 2022-01-29
  Administered 2022-01-29: 175 ug via RESPIRATORY_TRACT
  Filled 2022-01-28: qty 3

## 2022-01-28 MED ORDER — DEXMEDETOMIDINE HCL IN NACL 400 MCG/100ML IV SOLN
0.4000 ug/kg/h | INTRAVENOUS | Status: DC
  Administered 2022-01-28: 1 ug/kg/h via INTRAVENOUS
  Administered 2022-01-28: 1.2 ug/kg/h via INTRAVENOUS
  Administered 2022-01-28 (×3): 1 ug/kg/h via INTRAVENOUS
  Administered 2022-01-28: 0.6 ug/kg/h via INTRAVENOUS
  Administered 2022-01-28: 1 ug/kg/h via INTRAVENOUS
  Filled 2022-01-28 (×7): qty 100

## 2022-01-28 MED ORDER — VANCOMYCIN HCL 1250 MG/250ML IV SOLN
1250.0000 mg | Freq: Two times a day (BID) | INTRAVENOUS | Status: AC
  Administered 2022-01-29 – 2022-01-30 (×4): 1250 mg via INTRAVENOUS
  Filled 2022-01-28 (×4): qty 250

## 2022-01-28 MED ORDER — DILTIAZEM HCL 60 MG PO TABS
60.0000 mg | ORAL_TABLET | Freq: Three times a day (TID) | ORAL | Status: DC
  Administered 2022-01-28: 60 mg
  Filled 2022-01-28: qty 1

## 2022-01-28 MED ORDER — SODIUM CHLORIDE 0.9% FLUSH: 10.0000 mL | INTRAVENOUS | Status: DC | PRN

## 2022-01-28 MED ORDER — DOCUSATE SODIUM 50 MG/5ML PO LIQD
100.0000 mg | Freq: Two times a day (BID) | ORAL | Status: DC | PRN
  Administered 2022-02-01: 100 mg

## 2022-01-28 MED ORDER — PREDNISONE 10 MG PO TABS: 10.0000 mg | ORAL_TABLET | Freq: Every day | ORAL | Status: DC

## 2022-01-28 MED ORDER — SODIUM CHLORIDE 0.9% FLUSH
10.0000 mL | Freq: Two times a day (BID) | INTRAVENOUS | Status: DC
  Administered 2022-01-29 – 2022-02-11 (×20): 10 mL

## 2022-01-28 MED ORDER — DILTIAZEM HCL-DEXTROSE 125-5 MG/125ML-% IV SOLN (PREMIX)
5.0000 mg/h | INTRAVENOUS | Status: DC
  Administered 2022-01-28 (×2): 15 mg/h via INTRAVENOUS
  Filled 2022-01-28 (×2): qty 125

## 2022-01-28 MED ORDER — PREDNISONE 10 MG PO TABS: 30.0000 mg | ORAL_TABLET | Freq: Every day | ORAL | Status: DC

## 2022-01-28 MED ORDER — FUROSEMIDE 10 MG/ML IJ SOLN
40.0000 mg | Freq: Once | INTRAMUSCULAR | Status: AC
  Administered 2022-01-28: 40 mg via INTRAVENOUS
  Filled 2022-01-28: qty 4

## 2022-01-28 MED ORDER — PREDNISONE 10 MG PO TABS
40.0000 mg | ORAL_TABLET | Freq: Every day | ORAL | Status: DC
  Administered 2022-01-29: 40 mg
  Filled 2022-01-28: qty 4

## 2022-01-28 MED ORDER — ARFORMOTEROL TARTRATE 15 MCG/2ML IN NEBU
15.0000 ug | INHALATION_SOLUTION | Freq: Two times a day (BID) | RESPIRATORY_TRACT | Status: DC
  Administered 2022-01-29 – 2022-02-07 (×19): 15 ug via RESPIRATORY_TRACT
  Filled 2022-01-28 (×22): qty 2

## 2022-01-28 MED ORDER — FREE WATER
30.0000 mL | Status: DC
  Administered 2022-01-28 – 2022-02-02 (×27): 30 mL

## 2022-01-28 NOTE — Progress Notes (Signed)
?  Progress Note  ? ?Date: 01/28/2022 ? ?Patient Name: Kenneth Benson        ?MRN#: [id number] ? ?Clarification of the diagnosis of pulmonary edema: ? ?Cardiogenic pulmonary edema due to probable diastolic dysfunction ?Acute pulmonary edema ? ?Marcelo Baldy, MD 01/28/22 8:40 PM   ? ?

## 2022-01-28 NOTE — Progress Notes (Signed)
?  ?  Palliative Medicine Team ? ? ? ?Palliative Medicine consult noted. Due to high referral volume, there may be a delay seeing this patient. Please call the Palliative Medicine Team office at [phone] if recommendations are needed in the interim. A provider will be available on site 01/31/2022. If patient is expected to discharge before this date, please make outpatient palliative care referral as appropriate.  ?  ? ?  ? ?Thank you for inviting us to participate in the care of this patient. ?  ?Tawna Alwin, MSN, RN ?Palliative Medicine Team  ?

## 2022-01-28 NOTE — Progress Notes (Signed)
NAME:  MARKEVIOUS EHMKE, MRN:  [id number], DOB:  22-Sep-1961, LOS: 7 ADMISSION DATE:  01/21/2022, CONSULTATION DATE: 01/21/2022 REFERRING MD: Dr. Cheri Fowler, CHIEF COMPLAINT: Shortness of Breath    History of Present Illness:  This is a 61 yo male who presented to [facility] ER via EMS on 02/24 with c/o shortness of breath, cough, wheezing, and congestion onset of symptoms several days prior to presentation. He reported using his inhaler without relief of symptoms.  EMS reported pt severely hypoxic with O2 sats in the 60's.  He received duoneb's x2 and 125 mg of iv solumedrol en route to the ER.  All hx obtained from chart review pt currently sedated and mechanically intubated with no family at bedside.   ED Course Upon arrival to the ER pt noted to be in moderate respiratory distress with diffuse wheezes per ED provider notes.  CXR, COVID-19/Influenza A&B, and pct negative.  VBG revealed pH 7.17/pCO2 99/bicarb 36.1.  EKG revealed sinus tachycardia hr 140 without ST elevation/depression and troponin 9.  He received duoneb x3 and 2 g of iv magnesium.  He was subsequently placed on Bipap with initial improvement of respiratory status.  However, respiratory status rapidly declined and pt required mechanical intubation.  He received an additional 2 g of iv magnesium and duoneb post intubation.  PCCM team contacted for ICU admission.  Pertinent  Medical History  COPD Current Smoker  Asthma   Significant Hospital Events: Including procedures, antibiotic start and stop dates in addition to other pertinent events   02/24: Pt admitted to ICU with acute on chronic hypercapnic hypoxic respiratory failure secondary to asthma and COPD exacerbation requiring mechanical intubation  02/25: Remains intubated, vent changes allowed 02/27: Remains intubated, slowly weaning vent support. Respiratory culture with normal respiratory flora 03/01: Cardizem and Heparin gtts initiated by Cardiology for AF. Unable to tolerate decrease  in sedation due to Tachycardia and hypoxia. 03/02: Sedation lightened for WUA, delirious but able to follow simple commands.  CXR concerning for possible pulmonary edema, diuresing with Diamox + Metolazone.  Heparin gtt changed to treatment dose Lovenox  Interim History / Subjective:  Remains intubated and sedated on 45% FiO2, 10 PEEP. New fever. Afib with RVR overnight requiring reinitiation of dilt gtt.   Objective   Blood pressure (!) 147/87, pulse (!) 101, temperature (!) 101.1 F (38.4 C), resp. rate (!) 30, height 6' 0.01" (1.829 m), weight 102.2 kg, SpO2 96 %. CVP:  [12 mmHg-19 mmHg] 14 mmHg  Vent Mode: PRVC FiO2 (%):  [45 %-50 %] 45 % Set Rate:  [20 bmp] 20 bmp Vt Set:  [480 mL] 480 mL PEEP:  [10 cmH20] 10 cmH20 Plateau Pressure:  [16 cmH20] 16 cmH20   During rounds was able to switch patient to PRVC: Vent Mode: PRVC FiO2 (%):  [45 %-50 %] 45 % Set Rate:  [20 bmp] 20 bmp Vt Set:  [480 mL] 480 mL PEEP:  [10 cmH20] 10 cmH20 Plateau Pressure:  [16 cmH20] 16 cmH20   Intake/Output Summary (Last 24 hours) at 01/28/2022 1957 Last data filed at 01/28/2022 1920 Gross per 24 hour  Intake 2517.24 ml  Output 3965 ml  Net -1447.76 ml   Filed Weights   01/26/22 0433 01/27/22 0500 01/28/22 0456  Weight: 105.8 kg 100.4 kg 102.2 kg    Examination: General: Acutely ill appearing male, sedated and mechanically ventilated, in NAD HEENT: supple, JVD present, trachea midline, no crepitus Lungs: scattered expiratory rhonchi Cardiovascular: irregularly irregular rhythm, no MRG Abdomen: +BS  x4, obese, soft, non tender, non distended  Extremities: normal bulk and tone,  Neuro: sedated GU: indwelling foley draining clear yellow urine   Resolved Hospital Problem list   Acute kidney injury likely due to ATN Mild hyponatremia, hypochloremia  Assessment & Plan:   Acute on chronic hypoxic hypercapnic respiratory failure secondary to asthma and COPD exacerbation  Status  asthmaticus RHINOVIRUS bronchiolitis -Full vent support, implement lung protective strategies -Plateau pressures less than 30 cm H20 -Wean FiO2 & PEEP as tolerated to maintain O2 sats >88% -Follow intermittent Chest X-ray & ABG as needed -Spontaneous Breathing Trials when respiratory parameters met and mental status permits -Implement VAP Bundle -Bronchodilators and Pulmicort nebs -Start steroid taper as flow/volume loops not significantly obstructed -Diurese -May need to pursue tracheostomy given clinical course, but new fever would preclude trach  FEVER, concern for VAP -Repeat trach aspirate, start VAP coverage with vanc, cefepime -F/u cx data -Monitor fever curve, WBC -Pulmonary toileting  RENAL -Monitor I&O's / urinary output -Follow BMP -Ensure adequate renal perfusion -Avoid nephrotoxic agents as able -Replace electrolytes as indicated -Pharmacy following for assistance with electrolyte replacement  New onset Atrial Fibrillation w/ RVR -Continuous cardiac monitoring -Maintain MAP >65 -Vasopressors as needed to maintain MAP goal -Trend lactic acid until normalized -HS Troponin normal -Echocardiogram: LVEF 60 to 65%, mild LV dilatation increase right ventricular volume overload -Cardiology following, appreciate input -Cardizem and Lovenox infusions as per Cardiology -Will transition to PO Cardizem as able  Sedation needs in the setting of mechanical ventilation -Maintain a RASS goal of 0 -Fentanyl and Propofol as needed to maintain RASS goal -Avoid sedating medications as able -Daily wake up assessment  Hyperglycemia, likely due to critical illness and steroids -CBG's q4h; Target range of 140 to 180 -SSI -Follow ICU Hypo/Hyperglycemia protocol -Hgb A1c 4.9  SCROTAL SWELLING -Obtain scrotal U/S, hydrocele suspected -Will call Urology if findings concerning for alternative etiology   Best Practice (right click and "Reselect all SmartList Selections" daily)    Diet/type: NPO, tube feeds DVT prophylaxis: Heparin gtt (anticoagulation for A-fib) GI prophylaxis: PPI Lines: Central line and yes and it is still needed Address need daily Foley:  Yes, and it is still needed  Address need daily Code Status:  full code Last date of multidisciplinary goals of care discussion [01/28/2022]  Labs   CBC: Recent Labs  Lab 01/23/22 0427 01/24/22 0432 01/25/22 0312 01/26/22 0338 01/27/22 0359 01/28/22 0446  WBC 15.5* 10.5 9.4 9.3 10.0 8.9   9.2  NEUTROABS 13.6*  --   --   --   --  7.1  HGB 13.6 12.4* 12.2* 12.2* 12.3* 12.6*   12.5*  HCT 42.0 39.3 39.6 40.1 40.7 41.1   40.7  MCV 95.0 95.9 97.5 100.0 100.2* 99.8   98.8  PLT 163 142* 142* 153 127* 176   846    Basic Metabolic Panel: Recent Labs  Lab 01/23/22 0427 01/24/22 0432 01/25/22 0312 01/25/22 1241 01/26/22 0338 01/27/22 0359 01/27/22 2235 01/28/22 0446  NA 133* 140 140  --  144 143 143 141  K 4.7 5.0 5.2* 4.1 4.3 4.2 4.2 4.2  CL 92* 97* 99  --  98 101 103 101  CO2 33* 34* 37*  --  39* 39* 37* 35*  GLUCOSE 140* 158* 139*  --  149* 155* 156* 137*  BUN 43* 27* 30*  --  39* 42* 39* 39*  CREATININE 1.75* 0.92 0.76  --  0.78 0.82 0.73 0.74  CALCIUM 8.3* 8.2* 8.3*  --  8.5* 8.5* 8.6* 8.6*  MG 2.4 2.6* 2.4  --  2.3 2.3 2.2  --   PHOS 3.9 3.0 3.3  --  4.2 3.6  --   --    GFR: Estimated Creatinine Clearance: 122.9 mL/min (by C-G formula based on SCr of 0.74 mg/dL). Recent Labs  Lab 01/22/22 0358 01/23/22 0427 01/24/22 0432 01/25/22 0312 01/26/22 0338 01/27/22 0359 01/28/22 0446  PROCALCITON <0.10 <0.10 <0.10  --   --   --   --   WBC 13.9* 15.5* 10.5 9.4 9.3 10.0 8.9   9.2    Liver Function Tests: Recent Labs  Lab 01/22/22 0358  AST 26  ALT 33  ALKPHOS 40  BILITOT 0.6  PROT 6.7  ALBUMIN 3.6   No results for input(s): LIPASE, AMYLASE in the last 168 hours. No results for input(s): AMMONIA in the last 168 hours.  ABG    Component Value Date/Time   PHART 7.34 (L)  01/25/2022 1152   PCO2ART 70 (HH) 01/25/2022 1152   PO2ART 65 (L) 01/25/2022 1152   HCO3 37.8 (H) 01/25/2022 1152   O2SAT 95.2 01/25/2022 1152      Coagulation Profile: Recent Labs  Lab 01/26/22 0950  INR 1.1    Cardiac Enzymes: No results for input(s): CKTOTAL, CKMB, CKMBINDEX, TROPONINI in the last 168 hours.  HbA1C: Hgb A1c MFr Bld  Date/Time Value Ref Range Status  01/21/2022 02:36 PM 4.9 4.8 - 5.6 % Final    Comment:    (NOTE) Pre diabetes:          5.7%-6.4%  Diabetes:              >6.4%  Glycemic control for   <7.0% adults with diabetes     CBG: Recent Labs  Lab 01/28/22 0347 01/28/22 0728 01/28/22 1125 01/28/22 1548 01/28/22 1914  GLUCAP 129* 121* 135* 210* 151*    Review of Systems:   Unable to assess pt mechanically ventilated  Allergies No Known Allergies   Scheduled Meds:  budesonide (PULMICORT) nebulizer solution  0.25 mg Nebulization BID   chlorhexidine gluconate (MEDLINE KIT)  15 mL Mouth Rinse BID   Chlorhexidine Gluconate Cloth  6 each Topical Q0600   enoxaparin (LOVENOX) injection  1 mg/kg Subcutaneous BID   feeding supplement (PROSource TF)  45 mL Per Tube TID   free water  30 mL Per Tube Q4H   insulin aspart  0-15 Units Subcutaneous Q4H   ipratropium-albuterol  3 mL Nebulization Q4H   lactulose  20 g Per Tube Daily   mouth rinse  15 mL Mouth Rinse 10 times per day   pantoprazole (PROTONIX) IV  40 mg Intravenous Q24H   polyethylene glycol  17 g Per Tube Daily   [START ON 01/29/2022] predniSONE  40 mg Per Tube Q breakfast   Followed by   Derrill Memo ON 01/31/2022] predniSONE  30 mg Per Tube Q breakfast   Followed by   Derrill Memo ON 02/02/2022] predniSONE  20 mg Per Tube Q breakfast   Followed by   Derrill Memo ON 02/04/2022] predniSONE  10 mg Per Tube Q breakfast   senna-docusate  1 tablet Per Tube BID   Continuous Infusions:  sodium chloride Stopped (01/25/22 1112)   ceFEPime (MAXIPIME) IV Stopped (01/28/22 1452)   dexmedetomidine (PRECEDEX) IV  infusion 1 mcg/kg/hr (01/28/22 1920)   diltiazem (CARDIZEM) infusion 10 mg/hr (01/28/22 1920)   feeding supplement (VITAL HIGH PROTEIN) 1,000 mL (01/28/22 1048)   fentaNYL infusion INTRAVENOUS 250 mcg/hr (01/28/22 1806)  propofol (DIPRIVAN) infusion Stopped (01/27/22 1234)   [START ON 01/29/2022] vancomycin     PRN Meds:.acetaminophen (TYLENOL) oral liquid 160 mg/5 mL, albuterol, docusate, fentaNYL (SUBLIMAZE) injection, fentaNYL (SUBLIMAZE) injection, midazolam, ondansetron (ZOFRAN) IV, polyethylene glycol   Critical care time: 36 minutes     Bennie Pierini, MD 01/28/22 7:57 PM

## 2022-01-28 NOTE — Consult Note (Signed)
PHARMACY CONSULT NOTE ? ?Pharmacy Consult for Electrolyte Monitoring and Replacement  ? ?Recent Labs: ?Potassium (mmol/L)  ?Date Value  ?01/28/2022 4.2  ? ?Magnesium (mg/dL)  ?Date Value  ?01/27/2022 2.2  ? ?Calcium (mg/dL)  ?Date Value  ?01/28/2022 8.6 (L)  ? ?Albumin (g/dL)  ?Date Value  ?01/22/2022 3.6  ? ?Phosphorus (mg/dL)  ?Date Value  ?01/27/2022 3.6  ? ?Sodium (mmol/L)  ?Date Value  ?01/28/2022 141  ? ?Assessment: ?Patient is a 61 y/o M with medical history including COPD / asthma and tobacco abuse who presented to the ED 2/24 with acute respiratory failure secondary to asthma / COPD exacerbation. Patient ultimately required intubation. Disposition is the ICU. Pharmacy consulted to assist with electrolyte monitoring and replacement as indicated. ? ?Nutrition:  ?Vital HP at 20>30>82ml/hr + ProSource TF 90>12ml TID via tube  ?Free water flushes 160ml>50mL q4 hours ? ?Goal of Therapy:  ?Electrolytes within normal limits ? ?Plan:  ?Increasing TF rate, Stable, all lytes WNL. ? No electrolyte replacement indicated at this time. ?Follow-up electrolytes with AM labs  ? ?Martyn Malay, PharmD, BCCP ?Clinical Pharmacist ?01/28/2022 9:07 AM ? ? ?

## 2022-01-28 NOTE — Progress Notes (Signed)
[place] NOTE       Patient ID: Kenneth Benson MRN: [id number] DOB/AGE: 60-20-1962 50 y.o.  Admit date: 01/21/2022 Referring Physician Dr. Valora Piccolo Primary Physician  Primary Cardiologist  Reason for Consultation EKG changes  HPI: The patient is a 61 year old male with a past medical history notable for asthma, COPD, and current tobacco use who presented to [facility] ED in the early morning hours complaining of shortness of breath with worsening cough and congestion.  Per EMS reporting the patient's O2 sats were in the 60s and he was given 2 DuoNebs and Solu-Medrol on route.  He presented to the ED in moderate respiratory distress with diffuse wheezing and had chest tightness without chest pain.  He was given DuoNebs x3 and 2 g of IV mag and was subsequently placed on BiPAP and eventually required intubation due to worsening respiratory status.  Cardiology is consulted because his EKG changes and ventricular bigeminy on admission with periods of breakthrough AF with RVR.   Interval History:  -febrile and requiring metop boluses and amio boluses overnight for rate control.  -back on dilt gtt after trial of weaning drip and PO dilt that resulted in AF with RVR again  Review of systems unable to be assessed d/t patients sedation   Past Medical History:  Diagnosis Date   Asthma    Tobacco abuse     History reviewed. No pertinent surgical history.  Medications Prior to Admission  Medication Sig Dispense Refill Last Dose   albuterol (PROVENTIL HFA;VENTOLIN HFA) 108 (90 BASE) MCG/ACT inhaler Inhale 2 puffs into the lungs every 6 (six) hours as needed for wheezing. (Patient not taking: Reported on 01/21/2022)   Not Taking   ibuprofen (ADVIL,MOTRIN) 600 MG tablet Take 1 tablet (600 mg total) by mouth every 8 (eight) hours as needed. (Patient not taking: Reported on 01/21/2022) 20 tablet 0 Not Taking   Social History   Socioeconomic History   Marital status: Single     Spouse name: Not on file   Number of children: Not on file   Years of education: Not on file   Highest education level: Not on file  Occupational History   Not on file  Tobacco Use   Smoking status: Every Day   Smokeless tobacco: Not on file  Substance and Sexual Activity   Alcohol use: No   Drug use: No   Sexual activity: Not on file  Other Topics Concern   Not on file  Social History Narrative   Not on file   Social Determinants of Health   Financial Resource Strain: Not on file  Food Insecurity: Not on file  Transportation Needs: Not on file  Physical Activity: Not on file  Stress: Not on file  Social Connections: Not on file  Intimate Partner Violence: Not on file    History reviewed. No pertinent family history.    Review of systems complete and found to be negative unless listed above    PHYSICAL EXAM General: Acutely ill-appearing middle-aged Caucasian male, intubated in the ICU  HEENT:  Normocephalic and atraumatic. Neck:  No JVD.  Lungs: Intubated. Poor air movement, coarse breath sounds and without wheezes  Heart: normal rhythm with frequent premature beats and controlled rate. Normal S1 and S2 without gallops or murmurs. Radial & DP pulses 2+ bilaterally. Abdomen: Soft, obese Msk: Normal tone for age. Extremities: Warm and well perfused. No clubbing, cyanosis.  Trace pedal edema bilaterally with SCDs in place Neuro: Sedated. Psych: Unable  to assess due to sedation  Labs:   Lab Results  Component Value Date   WBC 9.2 01/28/2022   HGB 12.5 (L) 01/28/2022   HCT 40.7 01/28/2022   MCV 98.8 01/28/2022   PLT 166 01/28/2022    Recent Labs  Lab 01/22/22 0358 01/23/22 0427 01/28/22 0446  NA 136   < > 141  K 5.0   < > 4.2  CL 96*   < > 101  CO2 32   < > 35*  BUN 27*   < > 39*  CREATININE 1.16   < > 0.74  CALCIUM 7.6*   < > 8.6*  PROT 6.7  --   --   BILITOT 0.6  --   --   ALKPHOS 40  --   --   ALT 33  --   --   AST 26  --   --   GLUCOSE 163*    < > 137*   < > = values in this interval not displayed.    No results found for: CKTOTAL, CKMB, CKMBINDEX, TROPONINI No results found for: CHOL No results found for: HDL No results found for: [facility] Lab Results  Component Value Date   TRIG 56 01/28/2022   TRIG 71 01/25/2022   TRIG 93 01/22/2022   No results found for: CHOLHDL No results found for: LDLDIRECT    Radiology: DG Chest 1 View  Result Date: 01/21/2022 CLINICAL DATA:  Intubation EXAM: CHEST  1 VIEW COMPARISON:  None. FINDINGS: Endotracheal tube 4.7 cm from carina in good position. NG tube extends the stomach. Lungs are clear.  Normal cardiac silhouette.  No pneumothorax. IMPRESSION: 1. Support apparatus in good position. 2. No acute cardiopulmonary findings. Electronically Signed   By: Suzy Bouchard M.D.   On: 01/21/2022 09:19   DG Abdomen 1 View  Result Date: 01/21/2022 CLINICAL DATA:  A 61 year old male presents for evaluation of OG tube placement. EXAM: ABDOMEN - 1 VIEW COMPARISON:  Chest x-ray January 21, 2021 FINDINGS: EKG leads project over the patient's chest and abdomen. Bowel gas pattern not well assessed, abdomen incompletely imaged. Imaged focused over the lower chest and upper abdomen with OG tube tip in the gastric fundus. Side port not well delineated, potentially obscured by overlying EKG lead wires. IMPRESSION: OG tube tip in the gastric fundus. Side port not well delineated on current imaging based on overlying EKG lead wires. The tube is approximately 7-8 cm into the stomach based on projection. Electronically Signed   By: Zetta Bills M.D.   On: 01/21/2022 09:20   CT Angio Chest PE W/Cm &/Or Wo Cm  Result Date: 01/21/2022 CLINICAL DATA:  A 61 year old male presents for suspected pulmonary embolism, now intubated. EXAM: CT ANGIOGRAPHY CHEST WITH CONTRAST TECHNIQUE: Multidetector CT imaging of the chest was performed using the standard protocol during bolus administration of intravenous contrast. Multiplanar  CT image reconstructions and MIPs were obtained to evaluate the vascular anatomy. RADIATION DOSE REDUCTION: This exam was performed according to the departmental dose-optimization program which includes automated exposure control, adjustment of the mA and/or kV according to patient size and/or use of iterative reconstruction technique. CONTRAST:  6mL OMNIPAQUE IOHEXOL 350 MG/ML SOLN COMPARISON:  Chest x-ray of January 21, 2022. FINDINGS: Cardiovascular: Normal caliber of the thoracic aorta with calcified and noncalcified plaque. Central pulmonary vasculature is normal caliber. Main pulmonary artery attenuation is 165 Hounsfield units which limits assessment as does the presence of respiratory motion. Accounting for limitations no signs of central  or lobar embolus. No proximal segmental embolus to the extent that can be evaluated. Heart size is normal without signs of pericardial effusion. Mediastinum/Nodes: No acute findings in the mediastinum. Endotracheal tube terminates in the mid trachea. Gastric tube courses through the esophagus. No signs of adenopathy in the chest. Lungs/Pleura: No pneumothorax. No consolidation. No pleural effusion. Narrowing of mainstem bronchi suggests bronchomalacia, some dynamic assessment based on respiratory motion in the central chest on the current study with slit like appearance of bronchus intermedius. Material in lower lobe bronchi on the LEFT and to a lesser extent on the RIGHT with mild bronchial wall thickening. Upper Abdomen: Incidental imaging of upper abdominal contents without acute process related to visualized liver, pancreas, spleen, adrenal glands or kidneys. No acute gastrointestinal process to the extent evaluated. Gastric tube coiled in the stomach. Musculoskeletal: No chest wall abnormality. No acute or significant osseous findings. Review of the MIP images confirms the above findings. IMPRESSION: 1. Limited evaluation, no signs of pulmonary embolism to the  central segmental level. 2. Suspected bronchomalacia and potential early aspiration changes. No lobar consolidation or frank imaged itis at this time. 3. Aortic atherosclerosis. 4. Gastric tube coiled in the stomach. Aortic Atherosclerosis (ICD10-I70.0). Electronically Signed   By: Zetta Bills M.D.   On: 01/21/2022 10:27   DG Chest Port 1 View  Result Date: 01/27/2022 CLINICAL DATA:  Acute respiratory failure, hypercarbia EXAM: PORTABLE CHEST 1 VIEW COMPARISON:  01/25/2022 FINDINGS: Endotracheal tube 4.4 cm above the carina. Nasogastric tube extends into the gastric fundus. Left internal jugular central venous catheter tip at the superior vena caval confluence. Pulmonary insufflation is normal and symmetric. Superimposed diffuse interstitial pulmonary infiltrate has progressed in the interval since prior examination suggestive of diffuse pulmonary edema given its relatively rapid development. Central pulmonary arterial enlargement appears more prominent. Cardiac size is at the upper limits of normal. No pneumothorax. Small left pleural effusion. IMPRESSION: Interval development of progressive central pulmonary artery enlargement, diffuse interstitial pulmonary infiltrate, and small left pleural effusion all suggesting changes of mild to moderate cardiogenic failure. Borderline cardiomegaly. Stable support lines and tubes. Electronically Signed   By: Fidela Salisbury M.D.   On: 01/27/2022 02:45   DG Chest Port 1 View  Result Date: 01/25/2022 CLINICAL DATA:  Acute respiratory failure and hypoxia. EXAM: PORTABLE CHEST 1 VIEW COMPARISON:  Chest radiograph dated 01/23/2022. FINDINGS: Support apparatus similar in position. No focal consolidation, pleural effusion, pneumothorax. The cardiac silhouette is within normal limits. No acute osseous pathology. IMPRESSION: No active disease. Electronically Signed   By: Anner Crete M.D.   On: 01/25/2022 02:42   DG Chest Port 1 View  Result Date: 01/23/2022 CLINICAL  DATA:  Ventilator dependent respiratory failure. EXAM: PORTABLE CHEST 1 VIEW COMPARISON:  Portable chest yesterday at 3:46 p.m. FINDINGS: 5:14 a.m., 01/23/2022. Left IJ central line remains in the upper SVC. ETT tip is 3.7 cm from the carina. NGT is partially coiled in the stomach. There is mild cardiomegaly, normal caliber central vessels. Stable mediastinum with aortic atherosclerosis. There is increased retrocardiac/infrahilar left lower lobe opacity which could be due to atelectasis, pneumonia or aspiration. Bronchial thickening is also increased centrally. No pleural effusion is seen.  There is thoracic spondylosis. IMPRESSION: Increased left lower lobe opacity consistent with atelectasis, pneumonia or aspiration, with increased findings of central bronchitis. Remaining lung fields are clear. Electronically Signed   By: Telford Nab M.D.   On: 01/23/2022 07:11   DG Chest Port 1 View  Result Date: 01/22/2022  CLINICAL DATA:  Replacement of endotracheal tube EXAM: PORTABLE CHEST 1 VIEW COMPARISON:  Previous studies including the examination done earlier today FINDINGS: Tip of endotracheal tube is approximately 5.6 cm above the carina. Tip of enteric tube is seen in the fundus of the stomach. Transverse diameter of heart is in the upper limits of normal. There are no signs of pulmonary edema or focal pulmonary consolidation. There is no pleural effusion. Apices are not included in their entirety limiting evaluation for minimal pneumothorax. IMPRESSION: There are no new infiltrates or signs of pulmonary edema. Electronically Signed   By: Elmer Picker M.D.   On: 01/22/2022 16:15   DG Chest Port 1 View  Result Date: 01/22/2022 CLINICAL DATA:  Follow-up for respiratory failure. EXAM: PORTABLE CHEST 1 VIEW COMPARISON:  01/21/2022 and older studies. FINDINGS: Cardiac silhouette normal in size. Clear lungs.  No pneumothorax. Endotracheal tube, left internal jugular central venous line and nasal/orogastric  tube are stable. IMPRESSION: 1. No acute cardiopulmonary disease. 2. No change from the previous day's study. Electronically Signed   By: Lajean Manes M.D.   On: 01/22/2022 09:24   DG Chest Port 1 View  Result Date: 01/21/2022 CLINICAL DATA:  Status post central line placement EXAM: PORTABLE CHEST 1 VIEW COMPARISON:  AP chest 01/21/2022 earlier same day at 904 hours FINDINGS: 01/21/2022 at 1215 hours Left internal jugular central venous catheter tip overlies the junction of the left brachiocephalic vein and superior vena cava. Enteric tube descends below the diaphragm with the tip overlying the gastric fundus in the left upper quadrant. Cardiac silhouette and mediastinal contours are unchanged and within normal limits with mild calcification seen within the aortic arch. The lungs are clear.  No pleural effusion or pneumothorax. Moderate multilevel degenerative bridging osteophytes of the thoracic spine. IMPRESSION: New left internal jugular central venous catheter tip overlies the junction of the left brachiocephalic vein and superior vena cava. No acute lung process. Electronically Signed   By: Yvonne Kendall M.D.   On: 01/21/2022 12:36   DG Chest Portable 1 View  Result Date: 01/21/2022 CLINICAL DATA:  61 year old male with history of shortness of breath. EXAM: PORTABLE CHEST 1 VIEW COMPARISON:  Chest x-ray 10/11/2015. FINDINGS: Lung volumes are normal. No consolidative airspace disease. No pleural effusions. No pneumothorax. No pulmonary nodule or mass noted. Pulmonary vasculature and the cardiomediastinal silhouette are within normal limits. IMPRESSION: No radiographic evidence of acute cardiopulmonary disease. Electronically Signed   By: Vinnie Langton M.D.   On: 01/21/2022 06:11   ECHOCARDIOGRAM COMPLETE  Result Date: 01/21/2022    ECHOCARDIOGRAM REPORT   Patient Name:   VESTAL NUTILE [facility] Date of Exam: 01/21/2022 Medical Rec #:  [id number]     Height:       72.0 in Accession #:    [id number]    Weight:        236.3 lb Date of Birth:  December 25, 1960    BSA:          2.287 m Patient Age:    85 years      BP:           110/67 mmHg Patient Gender: M             HR:           84 bpm. Exam Location:  [facility] Procedure: 2D Echo, Cardiac Doppler and Color Doppler Indications:     Acute respiratory distress R06.03  History:         Patient has no  prior history of Echocardiogram examinations.                  Tobacco abuse.  Sonographer:     Sherrie Sport Referring Phys:  [id number] CARMEN L GONZALEZ Diagnosing Phys: Yolonda Kida MD  Sonographer Comments: Suboptimal apical window, suboptimal parasternal window and echo performed with patient supine and on artificial respirator. IMPRESSIONS  1. Left ventricular ejection fraction, by estimation, is 60 to 65%. The left ventricle has normal function. The left ventricle has no regional wall motion abnormalities. The left ventricular internal cavity size was mildly dilated. Left ventricular diastolic parameters were normal. There is the interventricular septum is flattened in diastole ('D' shaped left ventricle), consistent with right ventricular volume overload.  2. Right ventricular systolic function is normal. The right ventricular size is normal.  3. The mitral valve is normal in structure. Trivial mitral valve regurgitation.  4. The aortic valve is normal in structure. Aortic valve regurgitation is not visualized. FINDINGS  Left Ventricle: Left ventricular ejection fraction, by estimation, is 60 to 65%. The left ventricle has normal function. The left ventricle has no regional wall motion abnormalities. The left ventricular internal cavity size was mildly dilated. There is  borderline concentric left ventricular hypertrophy. The interventricular septum is flattened in diastole ('D' shaped left ventricle), consistent with right ventricular volume overload. Left ventricular diastolic parameters were normal. Right Ventricle: The right ventricular size is normal. No increase in right  ventricular wall thickness. Right ventricular systolic function is normal. Left Atrium: Left atrial size was normal in size. Right Atrium: Right atrial size was normal in size. Pericardium: There is no evidence of pericardial effusion. Mitral Valve: The mitral valve is normal in structure. Trivial mitral valve regurgitation. Tricuspid Valve: The tricuspid valve is normal in structure. Tricuspid valve regurgitation is trivial. Aortic Valve: The aortic valve is normal in structure. Aortic valve regurgitation is not visualized. Aortic valve mean gradient measures 2.0 mmHg. Aortic valve peak gradient measures 2.8 mmHg. Aortic valve area, by VTI measures 3.82 cm. Pulmonic Valve: The pulmonic valve was normal in structure. Pulmonic valve regurgitation is trivial. Aorta: The ascending aorta was not well visualized. IAS/Shunts: No atrial level shunt detected by color flow Doppler.  LEFT VENTRICLE PLAX 2D LVIDd:         5.10 cm   Diastology LVIDs:         3.30 cm   LV e' medial:    10.80 cm/s LV PW:         1.10 cm   LV E/e' medial:  7.5 LV IVS:        0.90 cm   LV e' lateral:   6.53 cm/s LVOT diam:     2.30 cm   LV E/e' lateral: 12.3 LV SV:         52 LV SV Index:   23 LVOT Area:     4.15 cm  RIGHT VENTRICLE RV Basal diam:  3.40 cm RV S prime:     7.94 cm/s TAPSE (M-mode): 2.4 cm LEFT ATRIUM             Index        RIGHT ATRIUM           Index LA diam:        3.40 cm 1.49 cm/m   RA Area:     16.20 cm LA Vol (A2C):   36.9 ml 16.13 ml/m  RA Volume:   40.10 ml  17.53 ml/m LA Vol (  A4C):   31.2 ml 13.64 ml/m LA Biplane Vol: 34.4 ml 15.04 ml/m  AORTIC VALVE                    PULMONIC VALVE AV Area (Vmax):    4.10 cm     PV Vmax:        0.75 m/s AV Area (Vmean):   3.30 cm     PV Vmean:       45.800 cm/s AV Area (VTI):     3.82 cm     PV VTI:         0.131 m AV Vmax:           83.20 cm/s   PV Peak grad:   2.3 mmHg AV Vmean:          59.200 cm/s  PV Mean grad:   1.0 mmHg AV VTI:            0.137 m      RVOT Peak grad: 3  mmHg AV Peak Grad:      2.8 mmHg AV Mean Grad:      2.0 mmHg LVOT Vmax:         82.20 cm/s LVOT Vmean:        47.000 cm/s LVOT VTI:          0.126 m LVOT/AV VTI ratio: 0.92  AORTA Ao Root diam: 3.50 cm MITRAL VALVE MV Area (PHT): 3.42 cm    SHUNTS MV Decel Time: 222 msec    Systemic VTI:  0.13 m MV E velocity: 80.60 cm/s  Systemic Diam: 2.30 cm MV A velocity: 58.30 cm/s  Pulmonic VTI:  0.163 m MV E/A ratio:  1.38 Dwayne D Callwood MD Electronically signed by Yolonda Kida MD Signature Date/Time: 01/21/2022/3:53:12 PM    Final     ECHO EF 60-65% with mild LV dilation, and RV overload without significant valvular pathologies.   TELEMETRY reviewed by me: afib rate 90s   EKG reviewed by me: initial EKG at 0538 showed sinus tach with rate of 140 and premature atrial complexes, repeat EKG at 0730 showed sinus tach with somewhat improved rate at 103 with PVCs frequently, most recent EKG at 08 54 showed sinus rhythm with ventricular bigeminy rate 98  ASSESSMENT AND PLAN:  The patient is a 61 year old male with a past medical history notable for asthma, COPD, and current tobacco use who presented to [facility] ED in the early morning hours complaining of shortness of breath with worsening cough and congestion.  Per EMS reporting the patient's O2 sats were in the 60s and he was given 2 DuoNebs and Solu-Medrol on route.  He presented to the ED in moderate respiratory distress with diffuse wheezing and had chest tightness without chest pain.  He was given DuoNebs x3 and 2 g of IV mag and was subsequently placed on BiPAP and eventually required intubation due to worsening respiratory status.  Cardiology is consulted because his EKG changes and ventricular bigeminy.  He was started on amiodarone on 2/24 by primary team to decrease PVC burden. He had new breakthrough atrial fibrillation noted on telemetry on 2/28 and amiodarone d/c and IV diltiazem initiated for rate control. Back in NSR spontaneously with bigeminy on  (similar to on admission) 3/2. On 3/3 febrile, got IV metoprolol 5mg  x2 and amio bolus for rate control and remains in AF with RVR after trial of PO dilt and weaning of gtt.  #new onset atrial fibrillation with RVR #  Frequent PVCs/ventricular bigeminy -s/p amiodarone bolus and gtt from 2/24-2/28 with breakthrough new onset AF 2/28, back in NSR 3/2 and in AF with RVR after trial of dilt weaning -s/p diltiazem bolus and continue dilt gtt for rate control -Recommend monitoring of electrolytes and replete in mag and potassium as needed -Echo resulted with preserved EF 60-65% with mild LV dilation, and RV overload without significant valvular pathologies.  -CHADSVASC 0 from information available via chart review -agree with full dose anticoagulation with lovenox per primary team.   #Acute hypoxic respiratory failure 2/2 asthma/COPD exacerbation #rhinovirus PCR positive #Tobacco abuse #Aortic atherosclerosis on CTA chest -Agree with current therapy and supportive care for COPD/asthma exacerbation per PCCM -agree with diuresis as needed with significant volume given  -Recommend outpatient follow-up with cardiology for risk factor modification with aortic atherosclerosis on CT imaging on admission -Agree with smoking cessation therapy in the future  This patient's plan of care was discussed and created with Dr. Isaias Cowman and he is in agreement.  Signed: Tristan Schroeder , PA-C 01/28/2022, 10:07 AM [facility]

## 2022-01-28 NOTE — Consult Note (Signed)
Pharmacy Antibiotic Note ? ?DENY Benson is a 61 y.o. male  with medical history including COPD / asthma and tobacco abuse who presented to the ED 2/24 with acute respiratory failure secondary to asthma / COPD exacerbation then required admission on 01/21/2022 to ICU.  Pharmacy has been consulted for Vancomycin and Cefepime dosing for evolving c/f HCAP. ? ?Plan: ?Cefepime IV 2g q8h  ? ?Vancomcyin loading with 2.5g x1 (1g+1.5g); followed by Vancomycin 1250 mg IV q12h ?Goal AUC 400-550  ?Est AUC: 478; Cmax: 34.6; Cmin: 11.8 ?SCr 0.74; IBW; Vd 0.5 (BMI ~31) ? ?F/u MRSA PCR ordered & cultures. ? ?Height: 6' 0.01" (182.9 cm) ?Weight: 102.2 kg (225 lb 5 oz) ?IBW/kg (Calculated) : 77.62 ? ?Temp (24hrs), Avg:100.1 ?F (37.8 ?C), Min:99.1 ?F (37.3 ?C), Max:100.8 ?F (38.2 ?C) ? ?Recent Labs  ?Lab 01/24/22 ?0432 01/25/22 ?0312 01/26/22 ?[id number] 01/27/22 ?0359 01/27/22 ?2235 01/28/22 ?0446  ?WBC 10.5 9.4 9.3 10.0  --  8.9  9.2  ?CREATININE 0.92 0.76 0.78 0.82 0.73 0.74  ?  ?Estimated Creatinine Clearance: 122.9 mL/min (by C-G formula based on SCr of 0.74 mg/dL).   ? ?No Known Allergies ? ?Antimicrobials this admission: ?Vanc/CFP (3/03 >>  ? ?Dose adjustments this admission: ?CTM and adjust PRN ? ?Microbiology results: ?3/03 Sputum: sent ?2/25 Rcx (trach asp): Few WBC, Few GPCs ?2/24 Resp viral: Rhino/enterovirus positive  ?2/24 MRSA PCR: Negative ?2/24 Cov/Flu: negative ? ?Thank you for allowing pharmacy to be a part of this patientKenneths care. ? ?Shanon Brow Shonique Pelphrey ?01/28/2022 1:43 PM ? ?

## 2022-01-28 NOTE — Progress Notes (Signed)
Patient HR continues to be elevated, 130s-140s. NP aware. Amiodarone gtt ordered to be restarted. Unable to wean Cardizem at this time due to HR remaining in the 130s. Now appears to be more of a sedation issue, pt awake and able to follow some simple commands. WOB increased. Fentanyl bolus administered and PRN Versed. Will continue to monitor.  ?

## 2022-01-28 NOTE — Progress Notes (Signed)
Patient afib-flutter 120-130's.Oral Cardizem given. Per Dr. Jonnie Finner restart drip. Continue to assess. ?

## 2022-01-28 NOTE — Progress Notes (Signed)
?  Progress Note  ? ?Date: 01/28/2022 ? ?Patient Name: Kenneth Benson        ?MRN#: [id number] ? ? ?The diagnosis of sepsis   ? ?sepsis is clinically supported by the following: fever, hypoxia, pneumonia suspected ? ?Marcelo Baldy, MD 01/28/22 8:39 PM   ? ? ? ? ?

## 2022-01-29 ENCOUNTER — Inpatient Hospital Stay: Payer: Self-pay

## 2022-01-29 ENCOUNTER — Inpatient Hospital Stay
Admit: 2022-01-29 | Discharge: 2022-01-29 | Disposition: A | Discharge status: Home / Self Care | Payer: Self-pay | Attending: Internal Medicine | Admitting: Internal Medicine

## 2022-01-29 DIAGNOSIS — A419 Sepsis, unspecified organism: Secondary | ICD-10-CM

## 2022-01-29 DIAGNOSIS — J4521 Mild intermittent asthma with (acute) exacerbation: Secondary | ICD-10-CM

## 2022-01-29 DIAGNOSIS — Z95828 Presence of other vascular implants and grafts: Secondary | ICD-10-CM | POA: Insufficient documentation

## 2022-01-29 LAB — CBC WITH DIFFERENTIAL/PLATELET
Abs Immature Granulocytes: 0.49 10*3/uL — ABNORMAL HIGH (ref 0.00–0.07)
Basophils Absolute: 0 10*3/uL (ref 0.0–0.1)
Basophils Relative: 0 %
Eosinophils Absolute: 0.4 10*3/uL (ref 0.0–0.5)
Eosinophils Relative: 4 %
HCT: 44.8 % (ref 39.0–52.0)
Hemoglobin: 13.8 g/dL (ref 13.0–17.0)
Immature Granulocytes: 5 %
Lymphocytes Relative: 9 %
Lymphs Abs: 0.9 10*3/uL (ref 0.7–4.0)
MCH: 30.2 pg (ref 26.0–34.0)
MCHC: 30.8 g/dL (ref 30.0–36.0)
MCV: 98 fL (ref 80.0–100.0)
Monocytes Absolute: 1.2 10*3/uL — ABNORMAL HIGH (ref 0.1–1.0)
Monocytes Relative: 12 %
Neutro Abs: 6.7 10*3/uL (ref 1.7–7.7)
Neutrophils Relative %: 70 %
Platelets: 198 10*3/uL (ref 150–400)
RBC: 4.57 MIL/uL (ref 4.22–5.81)
RDW: 14 % (ref 11.5–15.5)
WBC: 9.6 10*3/uL (ref 4.0–10.5)
nRBC: 0 % (ref 0.0–0.2)

## 2022-01-29 LAB — BLOOD GAS, ARTERIAL
Acid-Base Excess: 18.6 mmol/L — ABNORMAL HIGH (ref 0.0–2.0)
Acid-Base Excess: 15.5 mmol/L — ABNORMAL HIGH (ref 0.0–2.0)
Acid-Base Excess: 13.4 mmol/L — ABNORMAL HIGH (ref 0.0–2.0)
Acid-Base Excess: 14.3 mmol/L — ABNORMAL HIGH (ref 0.0–2.0)
Bicarbonate: 49.1 mmol/L — ABNORMAL HIGH (ref 20.0–28.0)
Bicarbonate: 46.7 mmol/L — ABNORMAL HIGH (ref 20.0–28.0)
Bicarbonate: 43.8 mmol/L — ABNORMAL HIGH (ref 20.0–28.0)
Bicarbonate: 43.5 mmol/L — ABNORMAL HIGH (ref 20.0–28.0)
FIO2: 90 %
FIO2: 80 %
FIO2: 80 %
FIO2: 75 %
MECHVT: 480 mL
MECHVT: 450 mL
MECHVT: 750 mL
MECHVT: 750 mL
Mechanical Rate: 12
O2 Saturation: 98.8 %
O2 Saturation: 94.5 %
O2 Saturation: 94.9 %
O2 Saturation: 99.1 %
PEEP: 10 cmH2O
PEEP: 16 cmH2O
PEEP: 10 cmH2O
PEEP: 10 cmH2O
Patient temperature: 37
Patient temperature: 37
Patient temperature: 37
Patient temperature: 37
RATE: 20 resp/min
RATE: 22 resp/min
RATE: 12 resp/min
RATE: 12 resp/min
pCO2 arterial: 89 mmHg (ref 32–48)
pCO2 arterial: 95 mmHg (ref 32–48)
pCO2 arterial: 87 mmHg (ref 32–48)
pCO2 arterial: 77 mmHg (ref 32–48)
pH, Arterial: 7.35 (ref 7.35–7.45)
pH, Arterial: 7.3 — ABNORMAL LOW (ref 7.35–7.45)
pH, Arterial: 7.31 — ABNORMAL LOW (ref 7.35–7.45)
pH, Arterial: 7.36 (ref 7.35–7.45)
pO2, Arterial: 110 mmHg — ABNORMAL HIGH (ref 83–108)
pO2, Arterial: 73 mmHg — ABNORMAL LOW (ref 83–108)
pO2, Arterial: 69 mmHg — ABNORMAL LOW (ref 83–108)
pO2, Arterial: 95 mmHg (ref 83–108)

## 2022-01-29 LAB — HEPATIC FUNCTION PANEL
ALT: 20 U/L (ref 0–44)
AST: 13 U/L — ABNORMAL LOW (ref 15–41)
Albumin: 2.8 g/dL — ABNORMAL LOW (ref 3.5–5.0)
Alkaline Phosphatase: 40 U/L (ref 38–126)
Bilirubin, Direct: 0.2 mg/dL (ref 0.0–0.2)
Indirect Bilirubin: 0.5 mg/dL (ref 0.3–0.9)
Total Bilirubin: 0.7 mg/dL (ref 0.3–1.2)
Total Protein: 7.1 g/dL (ref 6.5–8.1)

## 2022-01-29 LAB — BASIC METABOLIC PANEL
Anion gap: 7 (ref 5–15)
BUN: 46 mg/dL — ABNORMAL HIGH (ref 6–20)
CO2: 39 mmol/L — ABNORMAL HIGH (ref 22–32)
Calcium: 8.4 mg/dL — ABNORMAL LOW (ref 8.9–10.3)
Chloride: 98 mmol/L (ref 98–111)
Creatinine, Ser: 0.85 mg/dL (ref 0.61–1.24)
GFR, Estimated: >60 mL/min (ref >60)
Glucose, Bld: 120 mg/dL — ABNORMAL HIGH (ref 70–99)
Potassium: 3.6 mmol/L (ref 3.5–5.1)
Sodium: 144 mmol/L (ref 135–145)

## 2022-01-29 LAB — GLUCOSE, CAPILLARY
Glucose-Capillary: 108 mg/dL — ABNORMAL HIGH (ref 70–99)
Glucose-Capillary: 125 mg/dL — ABNORMAL HIGH (ref 70–99)
Glucose-Capillary: 134 mg/dL — ABNORMAL HIGH (ref 70–99)
Glucose-Capillary: 144 mg/dL — ABNORMAL HIGH (ref 70–99)
Glucose-Capillary: 149 mg/dL — ABNORMAL HIGH (ref 70–99)
Glucose-Capillary: 162 mg/dL — ABNORMAL HIGH (ref 70–99)

## 2022-01-29 LAB — CBC
HCT: 44.1 % (ref 39.0–52.0)
Hemoglobin: 13.7 g/dL (ref 13.0–17.0)
MCH: 30.2 pg (ref 26.0–34.0)
MCHC: 31.1 g/dL (ref 30.0–36.0)
MCV: 97.1 fL (ref 80.0–100.0)
Platelets: 182 10*3/uL (ref 150–400)
RBC: 4.54 MIL/uL (ref 4.22–5.81)
RDW: 13.7 % (ref 11.5–15.5)
WBC: 9.7 10*3/uL (ref 4.0–10.5)
nRBC: 0 % (ref 0.0–0.2)

## 2022-01-29 LAB — PROCALCITONIN: Procalcitonin: <0.1 ng/mL

## 2022-01-29 LAB — PHOSPHORUS: Phosphorus: 4 mg/dL (ref 2.5–4.6)

## 2022-01-29 LAB — MAGNESIUM: Magnesium: 2.1 mg/dL (ref 1.7–2.4)

## 2022-01-29 LAB — MRSA NEXT GEN BY PCR, NASAL: MRSA by PCR Next Gen: NOT DETECTED

## 2022-01-29 MED ORDER — METHYLPREDNISOLONE SODIUM SUCC 125 MG IJ SOLR
60.0000 mg | Freq: Three times a day (TID) | INTRAMUSCULAR | Status: DC
  Administered 2022-01-29 – 2022-01-31 (×5): 60 mg via INTRAVENOUS
  Filled 2022-01-29 (×5): qty 2

## 2022-01-29 MED ORDER — ACETAZOLAMIDE 250 MG PO TABS
500.0000 mg | ORAL_TABLET | Freq: Once | ORAL | Status: AC
  Administered 2022-01-29: 500 mg
  Filled 2022-01-29: qty 2

## 2022-01-29 MED ORDER — POTASSIUM CHLORIDE 10 MEQ/50ML IV SOLN
10.0000 meq | INTRAVENOUS | Status: AC
  Administered 2022-01-29 (×3): 10 meq via INTRAVENOUS
  Filled 2022-01-29 (×3): qty 50

## 2022-01-29 MED ORDER — VECURONIUM BROMIDE 10 MG IV SOLR
10.0000 mg | INTRAVENOUS | Status: DC | PRN
Start: 2022-01-29 — End: 2022-02-05
  Administered 2022-01-29 – 2022-01-30 (×8): 10 mg via INTRAVENOUS
  Filled 2022-01-29 (×9): qty 10

## 2022-01-29 MED ORDER — IPRATROPIUM-ALBUTEROL 0.5-2.5 (3) MG/3ML IN SOLN
3.0000 mL | RESPIRATORY_TRACT | Status: DC
  Administered 2022-01-29 – 2022-01-30 (×11): 3 mL via RESPIRATORY_TRACT
  Filled 2022-01-29 (×10): qty 3
  Filled 2022-01-29: qty 6
  Filled 2022-01-29: qty 3

## 2022-01-29 MED ORDER — BISACODYL 10 MG RE SUPP
10.0000 mg | Freq: Once | RECTAL | Status: AC
  Administered 2022-01-29: 10 mg via RECTAL
  Filled 2022-01-29: qty 1

## 2022-01-29 MED ORDER — FUROSEMIDE 10 MG/ML IJ SOLN: 40.0000 mg | Freq: Once | INTRAMUSCULAR | Status: DC

## 2022-01-29 MED ORDER — AMIODARONE HCL IN DEXTROSE 360-4.14 MG/200ML-% IV SOLN
60.0000 mg/h | INTRAVENOUS | Status: AC
  Administered 2022-01-29 (×2): 60 mg/h via INTRAVENOUS
  Filled 2022-01-29 (×2): qty 200

## 2022-01-29 MED ORDER — MAGNESIUM SULFATE 2 GM/50ML IV SOLN
2.0000 g | Freq: Once | INTRAVENOUS | Status: AC
  Administered 2022-01-29: 2 g via INTRAVENOUS
  Filled 2022-01-29: qty 50

## 2022-01-29 MED ORDER — METOPROLOL TARTRATE 5 MG/5ML IV SOLN
5.0000 mg | Freq: Four times a day (QID) | INTRAVENOUS | Status: DC | PRN
  Administered 2022-01-29 – 2022-02-05 (×8): 5 mg via INTRAVENOUS
  Filled 2022-01-29 (×9): qty 5

## 2022-01-29 MED ORDER — AMIODARONE HCL IN DEXTROSE 360-4.14 MG/200ML-% IV SOLN
60.0000 mg/h | INTRAVENOUS | Status: DC
Start: 2022-01-29 — End: 2022-01-31
  Administered 2022-01-29 – 2022-01-30 (×4): 30 mg/h via INTRAVENOUS
  Administered 2022-01-31: 60 mg/h via INTRAVENOUS
  Filled 2022-01-29 (×7): qty 200

## 2022-01-29 MED ORDER — AMIODARONE LOAD VIA INFUSION
150.0000 mg | Freq: Once | INTRAVENOUS | Status: AC
  Administered 2022-01-29: 150 mg via INTRAVENOUS
  Filled 2022-01-29: qty 83.34

## 2022-01-29 MED ORDER — METHYLPREDNISOLONE SODIUM SUCC 125 MG IJ SOLR
60.0000 mg | Freq: Every day | INTRAMUSCULAR | Status: DC
  Administered 2022-01-29: 60 mg via INTRAVENOUS
  Filled 2022-01-29: qty 2

## 2022-01-29 NOTE — Progress Notes (Signed)
Spoke with Doreen Salvage, RN caring for this patient. Reported the patient is intubated and cannot give consent. It is currently unclear the name listed on the contact information has DPOA. RN to speak with ordering MD regarding possible medical necessity for PICC placement. Pt currently has 1 PIV and a TL central line for IV access. Will follow up in the AM, 01/30/22. ?

## 2022-01-29 NOTE — Procedures (Signed)
Arterial Catheter Insertion Procedure Note ? ?ZUBAIR LOFTON  ?[id number]  ?21-Mar-1961 ? ?Date:01/29/22  ?Time:2:39 PM  ? ? ?Provider Performing: Ezequiel Essex  ? ? ?Procedure: Insertion of Arterial Line (16384) with US guidance (53646)  ? ?Indication(s) ?Blood pressure monitoring and/or need for frequent ABGs ? ?Consent ?Unable to obtain consent due to inability to find a medical decision maker for patient.  All reasonable efforts were made.  Another independent medical provider, Yes , confirmed the benefits of this procedure outweigh the risks. ? ?Anesthesia ?None ? ? ?Time Out ?Verified patient identification, verified procedure, site/side was marked, verified correct patient position, special equipment/implants available, medications/allergies/relevant history reviewed, required imaging and test results available. ? ? ?Sterile Technique ?Maximal sterile technique including full sterile barrier drape, hand hygiene, sterile gown, sterile gloves, mask, hair covering, sterile ultrasound probe cover (if used). ? ? ?Procedure Description ?Area of catheter insertion was cleaned with chlorhexidine and draped in sterile fashion. With real-time ultrasound guidance an arterial catheter was placed into the right radial artery.  Appropriate arterial tracings confirmed on monitor.   ? ? ?Complications/Tolerance ?None; patient tolerated the procedure well. ? ? ?EBL ?Minimal ? ? ?Specimen(s) ?None ? ?[name], AGNP  ?Pulmonary/Critical Care ?Pager [phone] (please enter 7 digits) ?PCCM Consult Pager [phone] (please enter 7 digits)  ?

## 2022-01-29 NOTE — Progress Notes (Signed)
[facility] Cardiology ? ?SUBJECTIVE: Intubated ? ? ?Vitals:  ? 01/29/22 0500 01/29/22 0600 01/29/22 0700 01/29/22 0718  ?BP: 113/81 102/71 104/74   ?Pulse: (!) 105 (!) 31 (!) 51   ?Resp: (!) 24 (!) 24 (!) 24   ?Temp: 100.2 ?F (37.9 ?C) 100 ?F (37.8 ?C) 99.7 ?F (37.6 ?C)   ?TempSrc:      ?SpO2: 96% 95% 93% 94%  ?Weight:      ?Height:      ? ? ? ?Intake/Output Summary (Last 24 hours) at 01/29/2022 0934 ?Last data filed at 01/29/2022 0732 ?Gross per 24 hour  ?Intake 2112.68 ml  ?Output 4620 ml  ?Net -2507.32 ml  ? ? ? ? ?PHYSICAL EXAM ? ?General: Well developed, well nourished, in no acute distress ?HEENT:  Normocephalic and atramatic ?Neck:  No JVD.  ?Lungs: Clear bilaterally to auscultation and percussion. ?Heart: HRRR . Normal S1 and S2 without gallops or murmurs.  ?Abdomen: Bowel sounds are positive, abdomen soft and non-tender  ?Msk:  Back normal, normal gait. Normal strength and tone for age. ?Extremities: No clubbing, cyanosis or edema.   ?Neuro: Intubated ?Psych: Intubated ? ? ?LABS: ?Basic Metabolic Panel: ?Recent Labs  ?  01/27/22 ?0359 01/27/22 ?2235 01/28/22 ?7829 01/29/22 ?0420  ?NA 143 143 141 144  ?K 4.2 4.2 4.2 3.6  ?CL 101 103 101 98  ?CO2 39* 37* 35* 39*  ?GLUCOSE 155* 156* 137* 120*  ?BUN 42* 39* 39* 46*  ?CREATININE 0.82 0.73 0.74 0.85  ?CALCIUM 8.5* 8.6* 8.6* 8.4*  ?MG 2.3 2.2  --  2.1  ?PHOS 3.6  --   --  4.0  ? ?Liver Function Tests: ?Recent Labs  ?  01/29/22 ?0420  ?AST 13*  ?ALT 20  ?ALKPHOS 40  ?BILITOT 0.7  ?PROT 7.1  ?ALBUMIN 2.8*  ? ?No results for input(s): LIPASE, AMYLASE in the last 72 hours. ?CBC: ?Recent Labs  ?  01/28/22 ?0446 01/29/22 ?0420  ?WBC 8.9  9.2 9.7  ?NEUTROABS 7.1  --   ?HGB 12.6*  12.5* 13.7  ?HCT 41.1  40.7 44.1  ?MCV 99.8  98.8 97.1  ?PLT 176  166 182  ? ?Cardiac Enzymes: ?No results for input(s): CKTOTAL, CKMB, CKMBINDEX, TROPONINI in the last 72 hours. ?BNP: ?Invalid input(s): POCBNP ?D-Dimer: ?No results for input(s): DDIMER in the last 72 hours. ?Hemoglobin A1C: ?No  results for input(s): HGBA1C in the last 72 hours. ?Fasting Lipid Panel: ?Recent Labs  ?  01/28/22 ?0446  ?TRIG 56  ? ?Thyroid Function Tests: ?No results for input(s): TSH, T4TOTAL, T3FREE, THYROIDAB in the last 72 hours. ? ?Invalid input(s): FREET3 ?Anemia Panel: ?No results for input(s): VITAMINB12, FOLATE, FERRITIN, TIBC, IRON, RETICCTPCT in the last 72 hours. ? ?DG Chest Port 1 View ? ?Result Date: 01/29/2022 ?CLINICAL DATA:  Hypoxia EXAM: PORTABLE CHEST 1 VIEW COMPARISON:  01/28/2022 FINDINGS: Support Apparatus: --Endotracheal tube: Tip just below the level of the clavicular heads. --Enteric tube:Tip and sideport project over the stomach. --Vascular catheter(s):Left internal jugular vein approach central venous catheter tip is in the mid SVC. --Other: None Slightly improved mild bilateral interstitial edema. No pleural effusion or pneumothorax. Left basilar atelectasis. IMPRESSION: 1. Slight improvement in mild bilateral interstitial edema. 2. Appropriate position of endotracheal tube. 3. Central venous catheter tip in the mid SVC. Electronically Signed   By: Deatra Robinson M.D.   On: 01/29/2022 02:36  ? ?DG Chest Port 1 View ? ?Result Date: 01/28/2022 ?CLINICAL DATA:  Hypoxia. EXAM: PORTABLE CHEST 1 VIEW COMPARISON:  Radiographs 01/27/2022 and 01/25/2022.  CT 01/21/2022. FINDINGS: 1051 hours. Endotracheal tube, enteric tube and left IJ central venous catheter appear unchanged. The heart size and mediastinal contours are stable. Pulmonary edema has improved. There is residual asymmetric opacity at the left lung base and a probable small left pleural effusion. No evidence of pneumothorax. The bones appear unremarkable.  Telemetry leads overlie the chest. IMPRESSION: Improving pulmonary edema with residual left basilar airspace disease and small left pleural effusion. Stable support system. Electronically Signed   By: Carey Bullocks M.D.   On: 01/28/2022 11:35  ? ?US SCROTUM W/DOPPLER ? ?Result Date:  01/28/2022 ?CLINICAL DATA:  Pain and swelling EXAM: SCROTAL ULTRASOUND DOPPLER ULTRASOUND OF THE TESTICLES TECHNIQUE: Complete ultrasound examination of the testicles, epididymis, and other scrotal structures was performed. Color and spectral Doppler ultrasound were also utilized to evaluate blood flow to the testicles. COMPARISON:  None. FINDINGS: Right testicle Measurements: 4.6 x 2.5 x 3 cm. No mass or microlithiasis visualized. Left testicle Measurements: 3.6 x 2.1 x 3.1 cm. No mass or microlithiasis visualized. Right epididymis:  Normal in size and appearance. Left epididymis:  Normal in size and appearance. Hydrocele: Small to moderate bilateral hydrocele, more so on the left side. Varicocele:  None visualized. Pulsed Doppler interrogation of both testes demonstrates normal low resistance arterial and venous waveforms bilaterally. There is edema in the subcutaneous plane in the scrotal wall without loculated fluid collections. IMPRESSION: There is no evidence of testicular torsion. There is homogeneous echogenicity in both testes. Bilateral hydrocele, more so on the left side. There is edema in the subcutaneous plane in the scrotal wall without loculated fluid collections. Electronically Signed   By: Ernie Avena M.D.   On: 01/28/2022 20:40   ? ? ?Echo LVEF 60 to 65% ? ?TELEMETRY: Atrial fibrillation 130 bpm: ? ?ASSESSMENT AND PLAN: ? ?Principal Problem: ?  Asthma exacerbation ?Active Problems: ?  COPD exacerbation (HCC) ?  Sepsis (HCC) ?  Tobacco abuse ?  Acute respiratory failure with hypoxia and hypercapnia (HCC) ?  Acute on chronic respiratory failure with hypercapnia (HCC) ?  ? ?1.  Atrial fibrillation with RVR, compensatory for underlying respiratory failure, previously controlled on Cardizem drip, currently on amiodarone drip, on Lovenox for stroke prevention ?2.  Frequent PVCs of uncertain clinical significance normal left ventricular function ?3.  Respiratory failure / asthma / COPD exacerbation,  intubated ? ?Recommendations ? ?1.  Agree with current therapy ?2.  Continue amiodarone drip for now ?3.  Continue Lovenox for stroke prevention ?4.  Defer further cardiac diagnostics at this time ? ? ?Marcina Millard, MD, PhD, [facility] ?01/29/2022 ?9:34 AM ? ? ? ?  ?

## 2022-01-29 NOTE — Progress Notes (Signed)
At approximately 0200 the patient desaturated into the 80's changed out probe, changed patient's position, suctioned patient with little result. Increased FIO2 to 90% and O2 saturation improved to 91%. Once sedation was switched to propofol the patient's o2 saturation and heart rate improved.  ? ?

## 2022-01-29 NOTE — Consult Note (Signed)
PHARMACY CONSULT NOTE ? ?Pharmacy Consult for Electrolyte Monitoring and Replacement  ? ?Recent Labs: ?Potassium (mmol/L)  ?Date Value  ?01/29/2022 3.6  ? ?Magnesium (mg/dL)  ?Date Value  ?01/29/2022 2.1  ? ?Calcium (mg/dL)  ?Date Value  ?01/29/2022 8.4 (L)  ? ?Albumin (g/dL)  ?Date Value  ?01/29/2022 2.8 (L)  ? ?Phosphorus (mg/dL)  ?Date Value  ?01/29/2022 4.0  ? ?Sodium (mmol/L)  ?Date Value  ?01/29/2022 144  ? ?Assessment: ?Patient is a 61 y/o M with medical history including COPD / asthma and tobacco abuse who presented to the ED 2/24 with acute respiratory failure secondary to asthma / COPD exacerbation. Patient ultimately required intubation. Disposition is the ICU. Pharmacy consulted to assist with electrolyte monitoring and replacement as indicated. ? ?Nutrition:  ?Vital HP at 20>30>55ml/hr + ProSource TF 90>18ml TID via tube  ?Free water flushes 158ml-->30mL q4 hours ?Started on amiodarone gtt on 3/4 ? ?Goal of Therapy:  ?Electrolytes within normal limits ? ?Plan:  ?Increasing TF rate, Stable, all lytes WNL. ?KCL IV x 3 doses ?Follow-up electrolytes with AM labs  ? ?Bettey Costa, PharmD ?Clinical Pharmacist ?01/29/2022 ?8:33 AM ? ? ? ?

## 2022-01-29 NOTE — Progress Notes (Addendum)
NAME:  Kenneth Benson, MRN:  [id number], DOB:  02/11/1961, LOS: 8 ADMISSION DATE:  01/21/2022, CONSULTATION DATE: 01/21/2022 REFERRING MD: Dr. Cheri Fowler, CHIEF COMPLAINT: Shortness of Breath    History of Present Illness:  This is a 61 yo male who presented to [facility] ER via EMS on 02/24 with c/o shortness of breath, cough, wheezing, and congestion onset of symptoms several days prior to presentation. He reported using his inhaler without relief of symptoms.  EMS reported pt severely hypoxic with O2 sats in the 60's.  He received duoneb's x2 and 125 mg of iv solumedrol en route to the ER.  All hx obtained from chart review pt currently sedated and mechanically intubated with no family at bedside.   ED Course Upon arrival to the ER pt noted to be in moderate respiratory distress with diffuse wheezes per ED provider notes.  CXR, COVID-19/Influenza A&B, and pct negative.  VBG revealed pH 7.17/pCO2 99/bicarb 36.1.  EKG revealed sinus tachycardia hr 140 without ST elevation/depression and troponin 9.  He received duoneb x3 and 2 g of iv magnesium.  He was subsequently placed on Bipap with initial improvement of respiratory status.  However, respiratory status rapidly declined and pt required mechanical intubation.  He received an additional 2 g of iv magnesium and duoneb post intubation.  PCCM team contacted for ICU admission.  Pertinent  Medical History  COPD Current Smoker  Asthma   Significant Hospital Events: Including procedures, antibiotic start and stop dates in addition to other pertinent events   02/24: Pt admitted to ICU with acute on chronic hypercapnic hypoxic respiratory failure secondary to asthma and COPD exacerbation requiring mechanical intubation  02/25: Remains intubated, vent changes allowed 02/27: Remains intubated, slowly weaning vent support. Respiratory culture with normal respiratory flora 03/01: Cardizem and Heparin gtts initiated by Cardiology for AF. Unable to tolerate decrease  in sedation due to Tachycardia and hypoxia. 03/02: Sedation lightened for WUA, delirious but able to follow simple commands.  CXR concerning for possible pulmonary edema, diuresing with Diamox + Metolazone.  Heparin gtt changed to treatment dose Lovenox 03/4: Pt severely hypoxic overnight with elevated peak pressures requiring following ventilator changes: FiO2 90% and PEEP 10. ETT exchanged.  Amiodarone bolus followed by drip started due to atrial fibrillation with rvr  Interim History / Subjective:  Pt remains hypoxic and hypercapnic despite ventilator setting adjustments, ETT exchange, and prn paralytics RASS currently -4 to -5.   Amiodarone gtt infusing _0  mg/hr heart rate currently controlled although pt having frequent PVC's with ventricular bigeminy   Objective   Blood pressure 111/65, pulse (!) 50, temperature 99.5 F (37.5 C), temperature source Bladder, resp. rate (!) 24, height 6' 0.01" (1.829 m), weight 102.2 kg, SpO2 98 %. CVP:  [8 mmHg-20 mmHg] 15 mmHg  Vent Mode: PRVC FiO2 (%):  [45 %-90 %] 80 % Set Rate:  [20 bmp-24 bmp] 22 bmp Vt Set:  [450 mL-480 mL] 450 mL PEEP:  [10 cmH20-16 cmH20] 16 cmH20 Plateau Pressure:  [16 cmH20] 16 cmH20   During rounds was able to switch patient to PRVC: Vent Mode: PRVC FiO2 (%):  [45 %-90 %] 80 % Set Rate:  [20 bmp-24 bmp] 22 bmp Vt Set:  [450 mL-480 mL] 450 mL PEEP:  [10 cmH20-16 cmH20] 16 cmH20 Plateau Pressure:  [16 cmH20] 16 cmH20   Intake/Output Summary (Last 24 hours) at 01/29/2022 1204 Last data filed at 01/29/2022 1100 Gross per 24 hour  Intake 2915.54 ml  Output 3820 ml  Net -904.46 ml   Filed Weights   01/26/22 0433 01/27/22 0500 01/28/22 0456  Weight: 105.8 kg 100.4 kg 102.2 kg   Examination: General: Acutely ill appearing male, sedated and mechanically ventilated, in NAD HEENT: supple, JVD present, trachea midline, no crepitus Lungs: rhonchi with inspiratory and expiratory wheezing, even, non labored  Cardiovascular:  Sinus with frequent PVC's and intermittent ventricular bigeminy, no R/G, 2+ radial/2+ distal pulses, trace bilateral upper extremity edema  Abdomen: +BS x4, obese, soft, non tender, non distended  Extremities: normal bulk and tone  Neuro: Heavily sedated RASS -4 to 5, PERRL GU: indwelling foley draining clear yellow urine   Resolved Hospital Problem list   Acute kidney injury likely due to ATN Mild hyponatremia, hypochloremia  Assessment & Plan:   Acute on chronic hypoxic hypercapnic respiratory failure~worsening  Status asthmaticus  Rhinovirus bronchiolitis  Concern for possible VAP~respiratory culture 03/3 moderate gram+ cocci in pair and moderate gram negative rods  -Full vent support, implement lung protective strategies -Plateau pressures less than 30 cm H20 -Wean FiO2 & PEEP as tolerated to maintain O2 sats >88% -Follow intermittent Chest X-ray & ABG as needed -Spontaneous Breathing Trials when respiratory parameters met and mental status permits -Implement VAP Bundle -Prn vecuronium for vent dyssynchrony  -CT Chest once pt stable for transport  -Will initiate proning 03/4 due to worsening hypoxia  -Bronchodilators and Pulmicort nebs -Restarted IV steroids  -Trend WBC and monitor fever curve -Will check pct  -Continue cefepime and vancomycin pending respiratory culture results  -Pt will likely need tracheostomy, however current fever's precludes this at this time    Cardiogenic pulmonary edema Recurrent atrial fibrillation with rvr Intermittent ventricular bigeminy with frequent PVC's       Echo 01/21/22:  Left ventricular ejection fraction, by estimation, is 60  to 65%. The left ventricle has normal function. The left ventricle has no  regional wall motion abnormalities. The left ventricular internal cavity  size was mildly dilated. There is  borderline concentric left ventricular hypertrophy. The interventricular  septum is flattened in diastole ('D' shaped left ventricle),  consistent with right ventricular volume overload. Left ventricular diastolic parameters were normal.  -Continuous telemetry monitoring  -Continue amiodarone gtt and prn metoprolol for rate management  -Echo limited bubble study pending  -Diurese as tolerated -Replace electrolytes as indicated  -Monitor UOP  -Continue therapeutic lovenox -Cardiology consulted appreciate input   Constipation -Continue current bowel regimen   Sedation needs in the setting of mechanical ventilation -Maintain a RASS goal of -4 to -5 due to worsening hypoxia  -Fentanyl and Propofol gtts along with prn vecuronium as needed to maintain RASS goal -Daily wake up assessment  Hyperglycemia, likely due to critical illness and steroids Hgb A1c 4.9 -CBG's q4h; Target range of 140 to 180 -SSI -Follow ICU Hypo/Hyperglycemia protocol  Best Practice (right click and "Reselect all SmartList Selections" daily)   Diet/type: NPO, tube feeds DVT prophylaxis: Therapeutic Lovenox  GI prophylaxis: PPI Lines: Central line and yes and it is still needed; will place arterial line 03/4 for frequent abg needs  Foley:  Yes, and it is still needed Code Status:  full code Last date of multidisciplinary goals of care discussion [01/27/2022]  Attempted to contact pts significant other [name] Hostetter via telephone, however she did not answer the telephone and voicemail is full unable to leave message.  No other family listed in epic to contact.  Labs   CBC: Recent Labs  Lab 01/23/22 0427 01/24/22 0432 01/25/22 0312 01/26/22  9563 01/27/22 0359 01/28/22 0446 01/29/22 0420  WBC 15.5*   < > 9.4 9.3 10.0 8.9   9.2 9.7  NEUTROABS 13.6*  --   --   --   --  7.1  --   HGB 13.6   < > 12.2* 12.2* 12.3* 12.6*   12.5* 13.7  HCT 42.0   < > 39.6 40.1 40.7 41.1   40.7 44.1  MCV 95.0   < > 97.5 100.0 100.2* 99.8   98.8 97.1  PLT 163   < > 142* 153 127* 176   166 182   < > = values in this interval not displayed.    Basic Metabolic  Panel: Recent Labs  Lab 01/24/22 0432 01/25/22 0312 01/25/22 1241 01/26/22 0338 01/27/22 0359 01/27/22 2235 01/28/22 0446 01/29/22 0420  NA 140 140  --  144 143 143 141 144  K 5.0 5.2*   < > 4.3 4.2 4.2 4.2 3.6  CL 97* 99  --  98 101 103 101 98  CO2 34* 37*  --  39* 39* 37* 35* 39*  GLUCOSE 158* 139*  --  149* 155* 156* 137* 120*  BUN 27* 30*  --  39* 42* 39* 39* 46*  CREATININE 0.92 0.76  --  0.78 0.82 0.73 0.74 0.85  CALCIUM 8.2* 8.3*  --  8.5* 8.5* 8.6* 8.6* 8.4*  MG 2.6* 2.4  --  2.3 2.3 2.2  --  2.1  PHOS 3.0 3.3  --  4.2 3.6  --   --  4.0   < > = values in this interval not displayed.   GFR: Estimated Creatinine Clearance: 115.7 mL/min (by C-G formula based on SCr of 0.85 mg/dL). Recent Labs  Lab 01/23/22 0427 01/24/22 0432 01/25/22 0312 01/26/22 0338 01/27/22 0359 01/28/22 0446 01/29/22 0420  PROCALCITON <0.10 <0.10  --   --   --   --   --   WBC 15.5* 10.5   < > 9.3 10.0 8.9   9.2 9.7   < > = values in this interval not displayed.    Liver Function Tests: Recent Labs  Lab 01/29/22 0420  AST 13*  ALT 20  ALKPHOS 40  BILITOT 0.7  PROT 7.1  ALBUMIN 2.8*   No results for input(s): LIPASE, AMYLASE in the last 168 hours. No results for input(s): AMMONIA in the last 168 hours.  ABG    Component Value Date/Time   PHART 7.3 (L) 01/29/2022 0753   PCO2ART 95 (HH) 01/29/2022 0753   PO2ART 73 (L) 01/29/2022 0753   HCO3 46.7 (H) 01/29/2022 0753   O2SAT 94.5 01/29/2022 0753      Coagulation Profile: Recent Labs  Lab 01/26/22 0950  INR 1.1    Cardiac Enzymes: No results for input(s): CKTOTAL, CKMB, CKMBINDEX, TROPONINI in the last 168 hours.  HbA1C: Hgb A1c MFr Bld  Date/Time Value Ref Range Status  01/21/2022 02:36 PM 4.9 4.8 - 5.6 % Final    Comment:    (NOTE) Pre diabetes:          5.7%-6.4%  Diabetes:              >6.4%  Glycemic control for   <7.0% adults with diabetes     CBG: Recent Labs  Lab 01/28/22 1914 01/28/22 2318  01/29/22 0326 01/29/22 0727 01/29/22 1140  GLUCAP 151* 142* 134* 108* 149*    Review of Systems:   Unable to assess pt mechanically ventilated  Allergies No Known Allergies  Scheduled Meds:  arformoterol  15 mcg Nebulization BID   bisacodyl  10 mg Rectal Once   budesonide (PULMICORT) nebulizer solution  0.25 mg Nebulization BID   chlorhexidine gluconate (MEDLINE KIT)  15 mL Mouth Rinse BID   Chlorhexidine Gluconate Cloth  6 each Topical Q0600   enoxaparin (LOVENOX) injection  1 mg/kg Subcutaneous BID   feeding supplement (PROSource TF)  45 mL Per Tube TID   free water  30 mL Per Tube Q4H   insulin aspart  0-15 Units Subcutaneous Q4H   lactulose  20 g Per Tube Daily   mouth rinse  15 mL Mouth Rinse 10 times per day   methylPREDNISolone (SOLU-MEDROL) injection  60 mg Intravenous Daily   pantoprazole (PROTONIX) IV  40 mg Intravenous Q24H   polyethylene glycol  17 g Per Tube Daily   revefenacin  175 mcg Nebulization Daily   senna-docusate  1 tablet Per Tube BID   sodium chloride flush  10-40 mL Intracatheter Q12H   Continuous Infusions:  sodium chloride Stopped (01/25/22 1112)   amiodarone 60 mg/hr (01/29/22 1100)   Followed by   amiodarone     ceFEPime (MAXIPIME) IV Stopped (01/29/22 0709)   dexmedetomidine (PRECEDEX) IV infusion Stopped (01/29/22 0203)   feeding supplement (VITAL HIGH PROTEIN) 1,000 mL (01/28/22 1048)   fentaNYL infusion INTRAVENOUS 300 mcg/hr (01/29/22 1100)   potassium chloride 10 mEq (01/29/22 1142)   propofol (DIPRIVAN) infusion 40 mcg/kg/min (01/29/22 1111)   vancomycin Stopped (01/29/22 0636)   PRN Meds:.acetaminophen (TYLENOL) oral liquid 160 mg/5 mL, albuterol, docusate, fentaNYL (SUBLIMAZE) injection, fentaNYL (SUBLIMAZE) injection, ipratropium-albuterol, metoprolol tartrate, midazolam, ondansetron (ZOFRAN) IV, polyethylene glycol, sodium chloride flush, vecuronium   Critical care time: 60 minutes      Donell Beers, [place] Pager [phone] (please enter 7 digits) PCCM Consult Pager [phone] (please enter 7 digits)

## 2022-01-29 NOTE — Progress Notes (Signed)
*  PRELIMINARY RESULTS* ?Echocardiogram ?2D Echocardiogram has been performed. ? ?Kenneth Benson ?01/29/2022, 3:59 PM ?

## 2022-01-29 NOTE — Procedures (Signed)
Intubation Procedure Note ? ?[name]  ?AM:[id number]  ?05-09-1961 ? ?Date:01/29/22  ?Time:1:56 PM  ? ?Provider Performing:Tiwanda Threats Sharene Butters  ? ? ?Procedure: Intubation ([id number]) ? ?Indication(s) ?Respiratory Failure ? ?Consent ?Risks of the procedure as well as the alternatives and risks of each were explained to the patient and/or caregiver.  Consent for the procedure was obtained and is signed in the bedside chart ? ? ?Anesthesia ?Fentanyl, Rocuronium, and Propofol ? ? ?Time Out ?Verified patient identification, verified procedure, site/side was marked, verified correct patient position, special equipment/implants available, medications/allergies/relevant history reviewed, required imaging and test results available. ? ? ?Sterile Technique ?Usual hand hygeine, masks, and gloves were used ? ? ?Procedure Description ?Patient positioned in bed supine.  Sedation given as noted above.  Endotracheal tube exchanged over Bougie using Glidescope.  View was Grade 1 full glottis .  Number of attempts was 1.  Colorimetric CO2 detector was consistent with tracheal placement. ? ? ?Complications/Tolerance ?None; patient tolerated the procedure well. ?Chest X-ray is ordered to verify placement. ? ? ?EBL ?Minimal ? ? ?Specimen(s) ?None ? ?Bennie Pierini, MD 01/29/22 1:57 PM   ?

## 2022-01-30 ENCOUNTER — Inpatient Hospital Stay: Payer: Self-pay

## 2022-01-30 LAB — BLOOD GAS, ARTERIAL
Acid-Base Excess: 13.5 mmol/L — ABNORMAL HIGH (ref 0.0–2.0)
Acid-Base Excess: 17 mmol/L — ABNORMAL HIGH (ref 0.0–2.0)
Acid-Base Excess: 12.3 mmol/L — ABNORMAL HIGH (ref 0.0–2.0)
Acid-Base Excess: 12.7 mmol/L — ABNORMAL HIGH (ref 0.0–2.0)
Bicarbonate: 45.6 mmol/L — ABNORMAL HIGH (ref 20.0–28.0)
Bicarbonate: 45.8 mmol/L — ABNORMAL HIGH (ref 20.0–28.0)
Bicarbonate: 39.2 mmol/L — ABNORMAL HIGH (ref 20.0–28.0)
Bicarbonate: 38.1 mmol/L — ABNORMAL HIGH (ref 20.0–28.0)
FIO2: 100 %
FIO2: 75 %
FIO2: 75 %
FIO2: 60 %
MECHVT: 450 mL
MECHVT: 750 mL
MECHVT: 750 mL
MECHVT: 630 mL
Mechanical Rate: 12
Mechanical Rate: 12
O2 Saturation: 98.4 %
O2 Saturation: 94.7 %
O2 Saturation: 96.6 %
O2 Saturation: 96.8 %
PEEP: 10 cmH2O
PEEP: 10 cmH2O
PEEP: 10 cmH2O
PEEP: 8 cmH2O
Patient temperature: 37
Patient temperature: 37
Patient temperature: 37
Patient temperature: 37
RATE: 24 resp/min
RATE: 12 resp/min
RATE: 18 {breaths}/min
pCO2 arterial: 104 mmHg (ref 32–48)
pCO2 arterial: 74 mmHg (ref 32–48)
pCO2 arterial: 59 mmHg — ABNORMAL HIGH (ref 32–48)
pCO2 arterial: 50 mmHg — ABNORMAL HIGH (ref 32–48)
pH, Arterial: 7.25 — ABNORMAL LOW (ref 7.35–7.45)
pH, Arterial: 7.4 (ref 7.35–7.45)
pH, Arterial: 7.43 (ref 7.35–7.45)
pH, Arterial: 7.49 — ABNORMAL HIGH (ref 7.35–7.45)
pO2, Arterial: 106 mmHg (ref 83–108)
pO2, Arterial: 68 mmHg — ABNORMAL LOW (ref 83–108)
pO2, Arterial: 72 mmHg — ABNORMAL LOW (ref 83–108)
pO2, Arterial: 66 mmHg — ABNORMAL LOW (ref 83–108)

## 2022-01-30 LAB — ECHOCARDIOGRAM LIMITED BUBBLE STUDY
AR max vel: 2.48 cm2
AV Peak grad: 5.1 mmHg
Ao pk vel: 1.13 m/s
Area-P 1/2: 3.19 cm2
S' Lateral: 4.79 cm

## 2022-01-30 LAB — GLUCOSE, CAPILLARY
Glucose-Capillary: 125 mg/dL — ABNORMAL HIGH (ref 70–99)
Glucose-Capillary: 130 mg/dL — ABNORMAL HIGH (ref 70–99)
Glucose-Capillary: 137 mg/dL — ABNORMAL HIGH (ref 70–99)
Glucose-Capillary: 139 mg/dL — ABNORMAL HIGH (ref 70–99)
Glucose-Capillary: 150 mg/dL — ABNORMAL HIGH (ref 70–99)
Glucose-Capillary: 151 mg/dL — ABNORMAL HIGH (ref 70–99)

## 2022-01-30 LAB — T4, FREE: Free T4: 0.67 ng/dL (ref 0.61–1.12)

## 2022-01-30 LAB — CBC
HCT: 40.6 % (ref 39.0–52.0)
Hemoglobin: 12.5 g/dL — ABNORMAL LOW (ref 13.0–17.0)
MCH: 29.8 pg (ref 26.0–34.0)
MCHC: 30.8 g/dL (ref 30.0–36.0)
MCV: 96.9 fL (ref 80.0–100.0)
Platelets: 150 10*3/uL (ref 150–400)
RBC: 4.19 MIL/uL — ABNORMAL LOW (ref 4.22–5.81)
RDW: 13.7 % (ref 11.5–15.5)
WBC: 8.7 10*3/uL (ref 4.0–10.5)
nRBC: 0 % (ref 0.0–0.2)

## 2022-01-30 LAB — DIFFERENTIAL
Abs Immature Granulocytes: 0.43 K/uL — ABNORMAL HIGH (ref 0.00–0.07)
Basophils Absolute: 0 K/uL (ref 0.0–0.1)
Basophils Relative: 0 %
Eosinophils Absolute: 0 K/uL (ref 0.0–0.5)
Eosinophils Relative: 0 %
Immature Granulocytes: 5 %
Lymphocytes Relative: 5 %
Lymphs Abs: 0.5 K/uL — ABNORMAL LOW (ref 0.7–4.0)
Monocytes Absolute: 0.6 K/uL (ref 0.1–1.0)
Monocytes Relative: 6 %
Neutro Abs: 7.5 K/uL (ref 1.7–7.7)
Neutrophils Relative %: 84 %

## 2022-01-30 LAB — BASIC METABOLIC PANEL
Anion gap: 7 (ref 5–15)
BUN: 46 mg/dL — ABNORMAL HIGH (ref 6–20)
CO2: 37 mmol/L — ABNORMAL HIGH (ref 22–32)
Calcium: 8.2 mg/dL — ABNORMAL LOW (ref 8.9–10.3)
Chloride: 99 mmol/L (ref 98–111)
Creatinine, Ser: 0.78 mg/dL (ref 0.61–1.24)
GFR, Estimated: >60 mL/min (ref >60)
Glucose, Bld: 151 mg/dL — ABNORMAL HIGH (ref 70–99)
Potassium: 4.2 mmol/L (ref 3.5–5.1)
Sodium: 143 mmol/L (ref 135–145)

## 2022-01-30 LAB — MAGNESIUM: Magnesium: 2.7 mg/dL — ABNORMAL HIGH (ref 1.7–2.4)

## 2022-01-30 LAB — PHOSPHORUS: Phosphorus: 3.3 mg/dL (ref 2.5–4.6)

## 2022-01-30 LAB — TSH: TSH: 0.417 u[IU]/mL (ref 0.350–4.500)

## 2022-01-30 MED ORDER — FUROSEMIDE 10 MG/ML IJ SOLN
80.0000 mg | Freq: Once | INTRAMUSCULAR | Status: AC
  Administered 2022-01-30: 80 mg via INTRAVENOUS
  Filled 2022-01-30: qty 8

## 2022-01-30 MED ORDER — IPRATROPIUM-ALBUTEROL 0.5-2.5 (3) MG/3ML IN SOLN
3.0000 mL | RESPIRATORY_TRACT | Status: DC
  Administered 2022-01-30 – 2022-02-03 (×21): 3 mL via RESPIRATORY_TRACT
  Filled 2022-01-30 (×21): qty 3

## 2022-01-30 MED ORDER — DILTIAZEM HCL 30 MG PO TABS
30.0000 mg | ORAL_TABLET | Freq: Four times a day (QID) | ORAL | Status: DC
  Administered 2022-01-30: 30 mg
  Filled 2022-01-30: qty 1

## 2022-01-30 MED ORDER — CALCIUM GLUCONATE-NACL 1-0.675 GM/50ML-% IV SOLN
1.0000 g | Freq: Once | INTRAVENOUS | Status: AC
  Administered 2022-01-30: 1000 mg via INTRAVENOUS
  Filled 2022-01-30: qty 50

## 2022-01-30 MED ORDER — DILTIAZEM HCL 60 MG PO TABS
60.0000 mg | ORAL_TABLET | Freq: Four times a day (QID) | ORAL | Status: DC
  Administered 2022-01-30 – 2022-02-01 (×7): 60 mg
  Filled 2022-01-30 (×7): qty 1

## 2022-01-30 MED ORDER — DILTIAZEM HCL 30 MG PO TABS
30.0000 mg | ORAL_TABLET | Freq: Four times a day (QID) | ORAL | Status: DC
  Administered 2022-01-30: 30 mg via ORAL
  Filled 2022-01-30: qty 1

## 2022-01-30 MED ORDER — AMIODARONE LOAD VIA INFUSION
150.0000 mg | Freq: Once | INTRAVENOUS | Status: AC
  Administered 2022-01-30: 150 mg via INTRAVENOUS
  Filled 2022-01-30: qty 83.34

## 2022-01-30 MED ORDER — CEFTRIAXONE SODIUM 2 G IJ SOLR
2.0000 g | INTRAMUSCULAR | Status: DC
  Administered 2022-01-30: 2 g via INTRAVENOUS
  Filled 2022-01-30: qty 20
  Filled 2022-01-30: qty 2

## 2022-01-30 MED ORDER — SODIUM CHLORIDE 0.9% FLUSH
10.0000 mL | Freq: Two times a day (BID) | INTRAVENOUS | Status: DC
  Administered 2022-01-30 – 2022-02-08 (×16): 10 mL
  Administered 2022-02-09: 20 mL
  Administered 2022-02-10 – 2022-02-11 (×4): 10 mL

## 2022-01-30 MED ORDER — DILTIAZEM HCL 25 MG/5ML IV SOLN
5.0000 mg | Freq: Once | INTRAVENOUS | Status: AC
  Administered 2022-01-30: 5 mg via INTRAVENOUS
  Filled 2022-01-30: qty 5

## 2022-01-30 MED ORDER — DILTIAZEM HCL 25 MG/5ML IV SOLN
10.0000 mg | Freq: Once | INTRAVENOUS | Status: AC
  Administered 2022-01-30: 10 mg via INTRAVENOUS
  Filled 2022-01-30: qty 5

## 2022-01-30 MED ORDER — SODIUM CHLORIDE 0.9% FLUSH: 10.0000 mL | INTRAVENOUS | Status: DC | PRN

## 2022-01-30 MED ORDER — MAGNESIUM SULFATE 2 GM/50ML IV SOLN
2.0000 g | Freq: Once | INTRAVENOUS | Status: AC
Start: 2022-01-30 — End: 2022-01-30
  Administered 2022-01-30: 2 g via INTRAVENOUS
  Filled 2022-01-30: qty 50

## 2022-01-30 NOTE — Progress Notes (Signed)
Attempts have been made to follow orders for svn and cpt as scheduled however patient remains tachycardic during treatment rounds. Rate noted high 120 to 140's. Will continue to monitor and assess.  ?

## 2022-01-30 NOTE — Consult Note (Signed)
PHARMACY CONSULT NOTE ? ?Pharmacy Consult for Electrolyte Monitoring and Replacement  ? ?Recent Labs: ?Potassium (mmol/L)  ?Date Value  ?01/30/2022 4.2  ? ?Magnesium (mg/dL)  ?Date Value  ?01/30/2022 2.7 (H)  ? ?Calcium (mg/dL)  ?Date Value  ?01/30/2022 8.2 (L)  ? ?Albumin (g/dL)  ?Date Value  ?01/29/2022 2.8 (L)  ? ?Phosphorus (mg/dL)  ?Date Value  ?01/30/2022 3.3  ? ?Sodium (mmol/L)  ?Date Value  ?01/30/2022 143  ? ?Assessment: ?Patient is a 61 y/o M with medical history including COPD / asthma and tobacco abuse who presented to the ED 2/24 with acute respiratory failure secondary to asthma / COPD exacerbation. Patient ultimately required intubation. Disposition is the ICU. Pharmacy consulted to assist with electrolyte monitoring and replacement as indicated. ? ?Nutrition:  ?Vital HP at 20>30>45ml/hr + ProSource TF 90>50ml TID via tube  ?Free water flushes 161ml-->30mL q4 hours ?Started on amiodarone gtt on 3/4 ? ?Goal of Therapy:  ?Electrolytes within normal limits ? ?Plan:  ?Increasing TF rate, Stable, all lytes WNL. ?No supplementation currently needed ?Follow-up electrolytes with AM labs  ? ?Pearla Dubonnet, PharmD ?Clinical Pharmacist ?01/30/2022 ?8:44 AM ? ? ? ?

## 2022-01-30 NOTE — Plan of Care (Signed)
Unable to reach family, calls go straight to voicemail for listed person and no other numbers available. Has had old central line that needs removal. Medical necessity invoked for triple lumen PICC placement. ? ?Marcelo Baldy, MD 01/30/22 7:49 AM   ?

## 2022-01-30 NOTE — Progress Notes (Signed)
Kenneth Davenport RN aware of order to remove the CVC and PIV and to utilize the PICC. ?

## 2022-01-30 NOTE — Progress Notes (Signed)
Patients heart rate continues to be 130-140's. Amiodarone bolus given this am. Cardizem PO and metoprolol given. Dr. Arlean Hopping notified no additional orders given. Continue to assess.  ?

## 2022-01-30 NOTE — Progress Notes (Signed)
NAME:  SHAREEF EDDINGER, MRN:  [id number], DOB:  [birth year], LOS: 9 ADMISSION DATE:  01/21/2022, CONSULTATION DATE: 01/21/2022 REFERRING MD: Dr. Cheri Fowler, CHIEF COMPLAINT: Shortness of Breath    History of Present Illness:  This is a 61 y.o. male who presented to [facility] ER via EMS on 02/24 with c/o shortness of breath, cough, wheezing, and congestion onset of symptoms several days prior to presentation. He reported using his inhaler without relief of symptoms.  EMS reported pt severely hypoxic with O2 sats in the 60's.  He received duoneb's x2 and 125 mg of iv solumedrol en route to the ER.  All hx obtained from chart review pt currently sedated and mechanically intubated with no family at bedside.   ED Course Upon arrival to the ER pt noted to be in moderate respiratory distress with diffuse wheezes per ED provider notes.  CXR, COVID-19/Influenza A&B, and pct negative.  VBG revealed pH 7.17/pCO2 99/bicarb 36.1.  EKG revealed sinus tachycardia hr 140 without ST elevation/depression and troponin 9.  He received duoneb x3 and 2 g of iv magnesium.  He was subsequently placed on Bipap with initial improvement of respiratory status.  However, respiratory status rapidly declined and pt required mechanical intubation.  He received an additional 2 g of iv magnesium and duoneb post intubation.  PCCM team contacted for ICU admission.  Pertinent  Medical History  COPD Current Smoker  Asthma   Significant Hospital Events: Including procedures, antibiotic start and stop dates in addition to other pertinent events   02/24: Pt admitted to ICU with acute on chronic hypercapnic hypoxic respiratory failure secondary to asthma and COPD exacerbation requiring mechanical intubation  02/25: Remains intubated, vent changes allowed 02/27: Remains intubated, slowly weaning vent support. Respiratory culture with normal respiratory flora 03/01: Cardizem and Heparin gtts initiated by Cardiology for AF. Unable to tolerate  decrease in sedation due to Tachycardia and hypoxia. 03/02: Sedation lightened for WUA, delirious but able to follow simple commands.  CXR concerning for possible pulmonary edema, diuresing with Diamox + Metolazone.  Heparin gtt changed to treatment dose Lovenox 03/4: Pt severely hypoxic overnight with elevated peak pressures requiring following ventilator changes: FiO2 90% and PEEP 10. ETT exchanged.  Amiodarone bolus followed by drip started due to atrial fibrillation with rvr 3/5: continued difficulty achieving rate control; remains on high vent settings  Interim History / Subjective:  ETT exchanged yesterday. On 75% FiO2 with PEEP 10 this AM. Peak pressures are improved and flow volume loops show improving obstruction as well, but still quite obstructed. Hypercarbia is improved and closer to baseline. Continued difficulties with maintaining rate control overnight requiring Cardizem bolus; on amiodarone infusion. Remains deeply sedated; generally compliant with vent.  Objective   Blood pressure 109/62, pulse (!) 130, temperature 99.9 F (37.7 C), resp. rate 13, height 6' 0.01" (1.829 m), weight 102.2 kg, SpO2 92 %.    Vent Mode: PRVC FiO2 (%):  [75 %-100 %] 75 % Set Rate:  [12 bmp-24 bmp] 12 bmp Vt Set:  [450 mL-750 mL] 750 mL PEEP:  [10 cmH20] 10 cmH20 Plateau Pressure:  [25 cmH20] 25 cmH20   During rounds was able to switch patient to PRVC: Vent Mode: PRVC FiO2 (%):  [75 %-100 %] 75 % Set Rate:  [12 bmp-24 bmp] 12 bmp Vt Set:  [450 mL-750 mL] 750 mL PEEP:  [10 cmH20] 10 cmH20 Plateau Pressure:  [25 cmH20] 25 cmH20   Intake/Output Summary (Last 24 hours) at 01/30/2022 9628 Last data filed  at 01/30/2022 0556 Gross per 24 hour  Intake 1680.12 ml  Output 2080 ml  Net -399.88 ml   Filed Weights   01/26/22 0433 01/27/22 0500 01/28/22 0456  Weight: 105.8 kg 100.4 kg 102.2 kg   Examination: General: Acutely ill appearing male, sedated and mechanically ventilated, in NAD Lungs:  rhonchi with inspiratory and expiratory wheezing  Cardiovascular: irregularly irregular, no M/R/G, 2+ radial/2+ distal pulses, 1+ pitting edema of BLE Abdomen: +BS x4, obese, soft, non tender, non distended  Neuro: Heavily sedated RASS -4 to 5 GU: indwelling foley draining clear yellow urine   Resolved Hospital Problem list   Acute kidney injury likely due to ATN Mild hyponatremia, hypochloremia  Assessment & Plan:   Acute on chronic hypoxic hypercapnic respiratory failure Status asthmaticus Rhinovirus bronchiolitis Concern for possible VAP~respiratory culture 03/3 moderate gram+ cocci in pair and moderate gram negative rods -Full vent support, implement lung protective strategies -Plateau pressures less than 30 cm H20 -Wean FiO2 & PEEP as tolerated to maintain O2 sats >88% -Follow intermittent Chest X-ray & ABG as needed -Spontaneous Breathing Trials when respiratory parameters met and mental status permits -Implement VAP Bundle -Prn vecuronium for vent dyssynchrony  -CT Chest today -Continue frequent Duo-Nebs, Brovana, Pulmicort nebs -Continue methylpred 60 mg IV q8h  -Sent IgE, Strogyloides Ab yesterday given persistent eos on differential -Continue cefepime and vancomycin pending respiratory culture results  -Pt will likely need tracheostomy, however fever and worsened respiratory status precludes trach at this time  Cardiogenic pulmonary edema Recurrent atrial fibrillation with rvr Intermittent ventricular bigeminy with frequent PVC's  TTE 01/21/22:  Left ventricular ejection fraction, by estimation, is 60  to 65%. The left ventricle has normal function. The left ventricle has no  regional wall motion abnormalities. The left ventricular internal cavity  size was mildly dilated. There is  borderline concentric left ventricular hypertrophy. The interventricular  septum is flattened in diastole ('D' shaped left ventricle), consistent with right ventricular volume overload. Left  ventricular diastolic parameters were normal.  -Continuous telemetry monitoring  -Continue amiodarone gtt; re-bolus today -Start scheduled Cardizem PO -Awaiting formal read of bubble study -Diurese for goal 1L negative today -Replace electrolytes as indicated  -Monitor UOP  -Continue therapeutic lovenox -Cardiology consulted appreciate input   Constipation -Continue current bowel regimen   Sedation needs in the setting of mechanical ventilation -Maintain a RASS goal of -4 to -5 due to worsening hypoxia  -Fentanyl and Propofol gtts along with prn vecuronium as needed to maintain RASS goal  Hyperglycemia, likely due to critical illness and steroids Hgb A1c 4.9 -CBG's q4h; Target range of 140 to 180 -SSI -Follow ICU Hypo/Hyperglycemia protocol  Best Practice (right click and "Reselect all SmartList Selections" daily)   Diet/type: NPO, tube feeds DVT prophylaxis: Therapeutic Lovenox  GI prophylaxis: PPI Lines: Central line and Arterial Line; will d/c CVC today and place PICC Foley:  Yes, and it is still needed Code Status:  full code Last date of multidisciplinary goals of care discussion [01/27/2022]  Significant other [name] Hostetter is only current contact. Need further clarification on status of additional family members to determine decision-maker. Labs   CBC: Recent Labs  Lab 01/26/22 0338 01/27/22 0359 01/28/22 0446 01/29/22 0420 01/30/22 0531  WBC 9.3 10.0 8.9   9.2 9.6   9.7 8.7  NEUTROABS  --   --  7.1 6.7  --   HGB 12.2* 12.3* 12.6*   12.5* 13.8   13.7 12.5*  HCT 40.1 40.7 41.1   40.7  44.8   44.1 40.6  MCV 100.0 100.2* 99.8   98.8 98.0   97.1 96.9  PLT 153 127* 176   166 198   182 867    Basic Metabolic Panel: Recent Labs  Lab 01/25/22 0312 01/25/22 1241 01/26/22 0338 01/27/22 0359 01/27/22 2235 01/28/22 0446 01/29/22 0420 01/30/22 0531  NA 140  --  144 143 143 141 144 143  K 5.2*   < > 4.3 4.2 4.2 4.2 3.6 4.2  CL 99  --  98 101 103 101 98 99   CO2 37*  --  39* 39* 37* 35* 39* 37*  GLUCOSE 139*  --  149* 155* 156* 137* 120* 151*  BUN 30*  --  39* 42* 39* 39* 46* 46*  CREATININE 0.76  --  0.78 0.82 0.73 0.74 0.85 0.78  CALCIUM 8.3*  --  8.5* 8.5* 8.6* 8.6* 8.4* 8.2*  MG 2.4  --  2.3 2.3 2.2  --  2.1 2.7*  PHOS 3.3  --  4.2 3.6  --   --  4.0 3.3   < > = values in this interval not displayed.   GFR: Estimated Creatinine Clearance: 122.9 mL/min (by C-G formula based on SCr of 0.78 mg/dL). Recent Labs  Lab 01/24/22 0432 01/25/22 0312 01/27/22 0359 01/28/22 0446 01/29/22 0420 01/30/22 0531  PROCALCITON <0.10  --   --   --  <0.10  --   WBC 10.5   < > 10.0 8.9   9.2 9.6   9.7 8.7   < > = values in this interval not displayed.    Liver Function Tests: Recent Labs  Lab 01/29/22 0420  AST 13*  ALT 20  ALKPHOS 40  BILITOT 0.7  PROT 7.1  ALBUMIN 2.8*   No results for input(s): LIPASE, AMYLASE in the last 168 hours. No results for input(s): AMMONIA in the last 168 hours.  ABG    Component Value Date/Time   PHART 7.4 01/30/2022 0500   PCO2ART 74 (HH) 01/30/2022 0500   PO2ART 68 (L) 01/30/2022 0500   HCO3 45.8 (H) 01/30/2022 0500   O2SAT 94.7 01/30/2022 0500      Coagulation Profile: Recent Labs  Lab 01/26/22 0950  INR 1.1    Cardiac Enzymes: No results for input(s): CKTOTAL, CKMB, CKMBINDEX, TROPONINI in the last 168 hours.  HbA1C: Hgb A1c MFr Bld  Date/Time Value Ref Range Status  01/21/2022 02:36 PM 4.9 4.8 - 5.6 % Final    Comment:    (NOTE) Pre diabetes:          5.7%-6.4%  Diabetes:              >6.4%  Glycemic control for   <7.0% adults with diabetes     CBG: Recent Labs  Lab 01/29/22 1553 01/29/22 1957 01/29/22 2338 01/30/22 0352 01/30/22 0734  GLUCAP 162* 144* 125* 130* 137*    Review of Systems:   Unable to assess pt mechanically ventilated  Allergies No Known Allergies   Scheduled Meds:  arformoterol  15 mcg Nebulization BID   budesonide (PULMICORT) nebulizer solution   0.25 mg Nebulization BID   chlorhexidine gluconate (MEDLINE KIT)  15 mL Mouth Rinse BID   Chlorhexidine Gluconate Cloth  6 each Topical Q0600   diltiazem  30 mg Per Tube Q6H   enoxaparin (LOVENOX) injection  1 mg/kg Subcutaneous BID   feeding supplement (PROSource TF)  45 mL Per Tube TID   free water  30 mL Per Tube Q4H  insulin aspart  0-15 Units Subcutaneous Q4H   ipratropium-albuterol  3 mL Nebulization Q2H   lactulose  20 g Per Tube Daily   mouth rinse  15 mL Mouth Rinse 10 times per day   methylPREDNISolone (SOLU-MEDROL) injection  60 mg Intravenous Q8H   pantoprazole (PROTONIX) IV  40 mg Intravenous Q24H   polyethylene glycol  17 g Per Tube Daily   senna-docusate  1 tablet Per Tube BID   sodium chloride flush  10-40 mL Intracatheter Q12H   Continuous Infusions:  sodium chloride Stopped (01/25/22 1112)   amiodarone 30 mg/hr (01/30/22 0644)   ceFEPime (MAXIPIME) IV 2 g (01/30/22 0556)   dexmedetomidine (PRECEDEX) IV infusion Stopped (01/29/22 0203)   feeding supplement (VITAL HIGH PROTEIN) 1,000 mL (01/28/22 1048)   fentaNYL infusion INTRAVENOUS 300 mcg/hr (01/30/22 0814)   propofol (DIPRIVAN) infusion 45 mcg/kg/min (01/30/22 0712)   vancomycin 1,250 mg (01/30/22 0558)   PRN Meds:.acetaminophen (TYLENOL) oral liquid 160 mg/5 mL, albuterol, docusate, fentaNYL (SUBLIMAZE) injection, fentaNYL (SUBLIMAZE) injection, metoprolol tartrate, midazolam, ondansetron (ZOFRAN) IV, polyethylene glycol, sodium chloride flush, vecuronium   Critical care time: 45 minutes     Bennie Pierini, MD 01/30/22 9:43 AM

## 2022-01-30 NOTE — Progress Notes (Signed)
Increased difficulty in keeping heart rate under 120, notified NP Ouma, received orders for one time dose of cardizem along with magnesium and calcium gluconate, cardizem has brought pulse to below 150's, but heart rate is still elevated in the 120's sustained with occasional increases into the 140's. Will continue to monitor.   ?

## 2022-01-30 NOTE — Progress Notes (Signed)
Patient sedated per orders-rass of -5. Patient afib and flutter rate 120-150's. Dr. Arlean Hopping notified throughout shift. Amiodarone bolus given and metoprolol, Cardizem IV and oral. Patient taken for a CT of head and chest. Per orders maintained tube feeds off. Foley intact. PICC placed and central line removed. Unable to reach [name] per the numbers is chart. Continue to assess. ?

## 2022-01-30 NOTE — Progress Notes (Signed)
Peripherally Inserted Central Catheter Placement ? ?The IV Nurse has discussed with the patient and/or persons authorized to consent for the patient, the purpose of this procedure and the potential benefits and risks involved with this procedure.  The benefits include less needle sticks, lab draws from the catheter, and the patient may be discharged home with the catheter. Risks include, but not limited to, infection, bleeding, blood clot (thrombus formation), and puncture of an artery; nerve damage and irregular heartbeat and possibility to perform a PICC exchange if needed/ordered by physician.  Alternatives to this procedure were also discussed.  Bard Power PICC patient education guide, fact sheet on infection prevention and patient information card has been provided to patient /or left at bedside.   ? ?PICC Placement Documentation  ?PICC ordered by [name], NP for medical necessity. ? ?PICC Triple Lumen 01/30/22 Right Cephalic 38 cm 0 cm (Active)  ?Indication for Insertion or Continuance of Line Vasoactive infusions 01/30/22 0900  ?Exposed Catheter (cm) 0 cm 01/30/22 0900  ?Site Assessment Clean, Dry, Intact 01/30/22 0900  ?Lumen #1 Status Flushed;Saline locked;Blood return noted 01/30/22 0900  ?Lumen #2 Status Flushed;Saline locked;Blood return noted 01/30/22 0900  ?Lumen #3 Status Flushed;Saline locked;Blood return noted 01/30/22 0900  ?Dressing Type Securing device;Transparent 01/30/22 0900  ?Dressing Status Antimicrobial disc in place 01/30/22 0900  ?Safety Lock Not Applicable 01/30/22 0900  ?Line Care Connections checked and tightened 01/30/22 0900  ?Line Adjustment (NICU/IV Team Only) No 01/30/22 0900  ?Dressing Intervention New dressing 01/30/22 0900  ?Dressing Change Due 02/06/22 01/30/22 0900  ? ? ? ? ? ?Vernona Rieger  Maayan Jenning ?01/30/2022, 10:00 AM ? ?

## 2022-01-30 NOTE — Progress Notes (Signed)
[facility]  SUBJECTIVE: Intubated   Vitals:   01/30/22 0400 01/30/22 0500 01/30/22 0600 01/30/22 0700  BP: 96/69 (!) 103/54 107/64 109/62  Pulse: (!) 115 (!) 123 (!) 121 (!) 130  Resp: 14 12 13 13   Temp: 99.3 F (37.4 C) 99.7 F (37.6 C) 99.9 F (37.7 C) 99.9 F (37.7 C)  TempSrc:      SpO2: 93% 91% 93% 92%  Weight:      Height:         Intake/Output Summary (Last 24 hours) at 01/30/2022 0940 Last data filed at 01/30/2022 04/01/2022 Gross per 24 hour  Intake 1680.12 ml  Output 2080 ml  Net -399.88 ml      PHYSICAL EXAM  General: Well developed, well nourished, in no acute distress HEENT:  Normocephalic and atramatic Neck:  No JVD.  Lungs: Clear bilaterally to auscultation and percussion. Heart: Irregular irregular rhythm. Normal S1 and S2 without gallops or murmurs.  Abdomen: Bowel sounds are positive, abdomen soft and non-tender  Msk:  Back normal, normal gait. Normal strength and tone for age. Extremities: No clubbing, cyanosis or edema.   Neuro: Intubated Psych: Intubated   LABS: Basic Metabolic Panel: Recent Labs    01/29/22 0420 01/30/22 0531  NA 144 143  K 3.6 4.2  CL 98 99  CO2 39* 37*  GLUCOSE 120* 151*  BUN 46* 46*  CREATININE 0.85 0.78  CALCIUM 8.4* 8.2*  MG 2.1 2.7*  PHOS 4.0 3.3   Liver Function Tests: Recent Labs    01/29/22 0420  AST 13*  ALT 20  ALKPHOS 40  BILITOT 0.7  PROT 7.1  ALBUMIN 2.8*   No results for input(s): LIPASE, AMYLASE in the last 72 hours. CBC: Recent Labs    01/28/22 0446 01/29/22 0420 01/30/22 0531  WBC 8.9   9.2 9.6   9.7 8.7  NEUTROABS 7.1 6.7  --   HGB 12.6*   12.5* 13.8   13.7 12.5*  HCT 41.1   40.7 44.8   44.1 40.6  MCV 99.8   98.8 98.0   97.1 96.9  PLT 176   166 198   182 150   Cardiac Enzymes: No results for input(s): CKTOTAL, CKMB, CKMBINDEX, TROPONINI in the last 72 hours. BNP: Invalid input(s): POCBNP D-Dimer: No results for input(s): DDIMER in the last 72 hours. Hemoglobin A1C: No  results for input(s): HGBA1C in the last 72 hours. Fasting Lipid Panel: Recent Labs    01/28/22 0446  TRIG 56   Thyroid Function Tests: Recent Labs    01/30/22 0531  TSH 0.417   Anemia Panel: No results for input(s): VITAMINB12, FOLATE, FERRITIN, TIBC, IRON, RETICCTPCT in the last 72 hours.  DG Chest Port 1 View  Result Date: 01/29/2022 CLINICAL DATA:  OG tube placement. EXAM: PORTABLE CHEST 1 VIEW COMPARISON:  01/29/2022; 01/28/2022 FINDINGS: Grossly unchanged enlarged cardiac silhouette and mediastinal contours. Stable position of support apparatus. Including tip and side port of orogastric tube overlying expected location of the gastric fundus. Pulmonary vasculature remains indistinct with cephalization of flow. Grossly unchanged bilateral perihilar and medial basilar heterogeneous opacities. No new focal airspace opacities. No pleural effusion or pneumothorax. No acute osseous abnormalities. IMPRESSION: 1.  Stable positioning of support apparatus.  No pneumothorax. 2. Similar findings of pulmonary edema and perihilar/bibasilar opacities, atelectasis versus infiltrate. Electronically Signed   By: 03/30/2022 M.D.   On: 01/29/2022 12:04   DG Chest Port 1 View  Result Date: 01/29/2022 CLINICAL DATA:  61 year old male  status post endotracheal tube placement. EXAM: PORTABLE CHEST 1 VIEW COMPARISON:  Chest x-ray 01/29/2022. FINDINGS: An endotracheal tube is in place with tip 6.4 cm above the carina. There is a left-sided internal jugular central venous catheter with tip terminating in the mid superior vena cava. A nasogastric tube is seen extending into the stomach, coiled with tip near the gastroesophageal junction. Lung volumes are low. Patchy ill-defined opacities and areas of interstitial prominence are noted throughout the lungs bilaterally, asymmetrically distributed, most confluent in the lower lungs with relative sparing of the left upper lobe. Small left pleural effusion. No definite right  pleural effusion. No pneumothorax. No evidence of pulmonary edema. Heart size is normal. Upper mediastinal contours are within normal limits. IMPRESSION: 1. Support apparatus, as above. 2. The appearance of the lungs suggests worsening multilobar bilateral bronchopneumonia. 3. Small left pleural effusion. Electronically Signed   By: Trudie Reed M.D.   On: 01/29/2022 09:52   DG Chest Port 1 View  Result Date: 01/29/2022 CLINICAL DATA:  Hypoxia EXAM: PORTABLE CHEST 1 VIEW COMPARISON:  01/28/2022 FINDINGS: Support Apparatus: --Endotracheal tube: Tip just below the level of the clavicular heads. --Enteric tube:Tip and sideport project over the stomach. --Vascular catheter(s):Left internal jugular vein approach central venous catheter tip is in the mid SVC. --Other: None Slightly improved mild bilateral interstitial edema. No pleural effusion or pneumothorax. Left basilar atelectasis. IMPRESSION: 1. Slight improvement in mild bilateral interstitial edema. 2. Appropriate position of endotracheal tube. 3. Central venous catheter tip in the mid SVC. Electronically Signed   By: Deatra Robinson M.D.   On: 01/29/2022 02:36   DG Chest Port 1 View  Result Date: 01/28/2022 CLINICAL DATA:  Hypoxia. EXAM: PORTABLE CHEST 1 VIEW COMPARISON:  Radiographs 01/27/2022 and 01/25/2022.  CT 01/21/2022. FINDINGS: 1051 hours. Endotracheal tube, enteric tube and left IJ central venous catheter appear unchanged. The heart size and mediastinal contours are stable. Pulmonary edema has improved. There is residual asymmetric opacity at the left lung base and a probable small left pleural effusion. No evidence of pneumothorax. The bones appear unremarkable.  Telemetry leads overlie the chest. IMPRESSION: Improving pulmonary edema with residual left basilar airspace disease and small left pleural effusion. Stable support system. Electronically Signed   By: Carey Bullocks M.D.   On: 01/28/2022 11:35   US SCROTUM W/DOPPLER  Result Date:  01/28/2022 CLINICAL DATA:  Pain and swelling EXAM: SCROTAL ULTRASOUND DOPPLER ULTRASOUND OF THE TESTICLES TECHNIQUE: Complete ultrasound examination of the testicles, epididymis, and other scrotal structures was performed. Color and spectral Doppler ultrasound were also utilized to evaluate blood flow to the testicles. COMPARISON:  None. FINDINGS: Right testicle Measurements: 4.6 x 2.5 x 3 cm. No mass or microlithiasis visualized. Left testicle Measurements: 3.6 x 2.1 x 3.1 cm. No mass or microlithiasis visualized. Right epididymis:  Normal in size and appearance. Left epididymis:  Normal in size and appearance. Hydrocele: Small to moderate bilateral hydrocele, more so on the left side. Varicocele:  None visualized. Pulsed Doppler interrogation of both testes demonstrates normal low resistance arterial and venous waveforms bilaterally. There is edema in the subcutaneous plane in the scrotal wall without loculated fluid collections. IMPRESSION: There is no evidence of testicular torsion. There is homogeneous echogenicity in both testes. Bilateral hydrocele, more so on the left side. There is edema in the subcutaneous plane in the scrotal wall without loculated fluid collections. Electronically Signed   By: Ernie Avena M.D.   On: 01/28/2022 20:40   [place] EKG SITE RITE  Result Date: 01/29/2022 If Site Rite image not attached, placement could not be confirmed due to current cardiac rhythm.    Echo LVEF 60-65%  TELEMETRY: Atrial fibrillation 120 bpm:  ASSESSMENT AND PLAN:  Principal Problem:   Asthma exacerbation Active Problems:   COPD exacerbation (HCC)   Sepsis (HCC)   Tobacco abuse   Acute respiratory failure with hypoxia and hypercapnia (HCC)   Acute on chronic respiratory failure with hypercapnia (HCC)   Arterial line in place    1. Atrial fibrillation with RVR, compensatory for underlying respiratory failure, previously controlled on Cardizem drip, currently on amiodarone drip, on  Lovenox for stroke prevention 2.  Frequent PVCs of uncertain clinical significance, normal left ventricular function 3.  Respiratory failure / asthma / COPD exacerbation / severe ARDS, intubated   Recommendations   1.  Agree with current therapy 2.  Continue amiodarone drip, Cardizem 30 mg every 6, for now 3.  Continue Lovenox for stroke prevention 4.  Defer further cardiac diagnostics at this time   Marcina Millard, MD, PhD, [facility] 01/30/2022 9:40 AM

## 2022-01-31 ENCOUNTER — Encounter: Payer: Self-pay | Admitting: Pulmonary Disease

## 2022-01-31 DIAGNOSIS — Z515 Encounter for palliative care: Secondary | ICD-10-CM

## 2022-01-31 DIAGNOSIS — Z7189 Other specified counseling: Secondary | ICD-10-CM

## 2022-01-31 LAB — BASIC METABOLIC PANEL
Anion gap: 8 (ref 5–15)
BUN: 48 mg/dL — ABNORMAL HIGH (ref 6–20)
CO2: 38 mmol/L — ABNORMAL HIGH (ref 22–32)
Calcium: 7.8 mg/dL — ABNORMAL LOW (ref 8.9–10.3)
Chloride: 96 mmol/L — ABNORMAL LOW (ref 98–111)
Creatinine, Ser: 0.88 mg/dL (ref 0.61–1.24)
GFR, Estimated: >60 mL/min (ref >60)
Glucose, Bld: 202 mg/dL — ABNORMAL HIGH (ref 70–99)
Potassium: 3.7 mmol/L (ref 3.5–5.1)
Sodium: 142 mmol/L (ref 135–145)

## 2022-01-31 LAB — GLUCOSE, CAPILLARY
Glucose-Capillary: 125 mg/dL — ABNORMAL HIGH (ref 70–99)
Glucose-Capillary: 124 mg/dL — ABNORMAL HIGH (ref 70–99)
Glucose-Capillary: 141 mg/dL — ABNORMAL HIGH (ref 70–99)
Glucose-Capillary: 121 mg/dL — ABNORMAL HIGH (ref 70–99)
Glucose-Capillary: 144 mg/dL — ABNORMAL HIGH (ref 70–99)

## 2022-01-31 LAB — TRIGLYCERIDES
Triglycerides: 638 mg/dL — ABNORMAL HIGH (ref <150)
Triglycerides: 90 mg/dL (ref <150)

## 2022-01-31 LAB — CULTURE, RESPIRATORY W GRAM STAIN

## 2022-01-31 LAB — CBC
HCT: 38.1 % — ABNORMAL LOW (ref 39.0–52.0)
Hemoglobin: 12 g/dL — ABNORMAL LOW (ref 13.0–17.0)
MCH: 30.9 pg (ref 26.0–34.0)
MCHC: 31.5 g/dL (ref 30.0–36.0)
MCV: 98.2 fL (ref 80.0–100.0)
Platelets: 153 10*3/uL (ref 150–400)
RBC: 3.88 MIL/uL — ABNORMAL LOW (ref 4.22–5.81)
RDW: 13.9 % (ref 11.5–15.5)
WBC: 8.6 10*3/uL (ref 4.0–10.5)
nRBC: 0 % (ref 0.0–0.2)

## 2022-01-31 LAB — MAGNESIUM: Magnesium: 2.3 mg/dL (ref 1.7–2.4)

## 2022-01-31 LAB — STRONGYLOIDES, AB, IGG: Strongyloides, Ab, IgG: NEGATIVE

## 2022-01-31 LAB — PHOSPHORUS: Phosphorus: 2.4 mg/dL — ABNORMAL LOW (ref 2.5–4.6)

## 2022-01-31 LAB — PREALBUMIN: Prealbumin: 18.4 mg/dL (ref 18–38)

## 2022-01-31 MED ORDER — ACETAMINOPHEN 10 MG/ML IV SOLN
1000.0000 mg | Freq: Four times a day (QID) | INTRAVENOUS | Status: AC
  Administered 2022-01-31 (×2): 1000 mg via INTRAVENOUS
  Filled 2022-01-31 (×2): qty 100

## 2022-01-31 MED ORDER — IBUPROFEN 100 MG/5ML PO SUSP
400.0000 mg | Freq: Once | ORAL | Status: AC
  Administered 2022-01-31: 400 mg
  Filled 2022-01-31: qty 20

## 2022-01-31 MED ORDER — METHYLPREDNISOLONE SODIUM SUCC 40 MG IJ SOLR
40.0000 mg | Freq: Two times a day (BID) | INTRAMUSCULAR | Status: AC
  Administered 2022-01-31 – 2022-02-01 (×3): 40 mg via INTRAVENOUS
  Filled 2022-01-31 (×3): qty 1

## 2022-01-31 MED ORDER — POTASSIUM CHLORIDE 20 MEQ PO PACK
40.0000 meq | PACK | Freq: Once | ORAL | Status: AC
  Administered 2022-01-31: 40 meq
  Filled 2022-01-31: qty 2

## 2022-01-31 MED ORDER — SODIUM CHLORIDE 0.9 % IV SOLN
2.0000 g | Freq: Three times a day (TID) | INTRAVENOUS | Status: DC
  Administered 2022-01-31 – 2022-02-01 (×4): 2 g via INTRAVENOUS
  Filled 2022-01-31 (×6): qty 2

## 2022-01-31 MED ORDER — FUROSEMIDE 10 MG/ML IJ SOLN
40.0000 mg | Freq: Two times a day (BID) | INTRAMUSCULAR | Status: AC
  Administered 2022-01-31 – 2022-02-03 (×8): 40 mg via INTRAVENOUS
  Filled 2022-01-31 (×8): qty 4

## 2022-01-31 MED ORDER — AMIODARONE HCL IN DEXTROSE 360-4.14 MG/200ML-% IV SOLN
30.0000 mg/h | INTRAVENOUS | Status: DC
  Administered 2022-02-01: 30 mg/h via INTRAVENOUS

## 2022-01-31 MED ORDER — AMIODARONE HCL IN DEXTROSE 360-4.14 MG/200ML-% IV SOLN: 60.0000 mg/h | INTRAVENOUS | Status: DC

## 2022-01-31 MED ORDER — PREDNISONE 20 MG PO TABS
50.0000 mg | ORAL_TABLET | Freq: Every day | ORAL | Status: DC
  Administered 2022-02-02 – 2022-02-05 (×4): 50 mg
  Filled 2022-01-31 (×4): qty 5

## 2022-01-31 MED ORDER — SODIUM CHLORIDE 0.9 % IV SOLN
3.0000 mg/kg/h | INTRAVENOUS | Status: DC
  Administered 2022-01-31 – 2022-02-01 (×11): 3 mg/kg/h via INTRAVENOUS
  Filled 2022-01-31 (×12): qty 5

## 2022-01-31 NOTE — TOC Progression Note (Signed)
Transition of Care (TOC) - Progression Note  ? ? ?Patient Details  ?Name: ELAD MACPHAIL ?MRN: [id number] ?Date of Birth: 19-Jul-1961 ? ?Transition of Care (TOC) CM/SW Contact  ?Allayne Butcher, RN ?Phone Number: ?01/31/2022, 11:21 AM ? ?Clinical Narrative:    ? ?Trying to locate next of Kin, telephone numbers that are on file for [name] are disconnected.  Also the number that [facility] has for Babette Relic is also disconnected.  RNCM reached out to [employer] to see if they had any records, from the EMS report [name] is patient's girlfriend and they live at [address] [place].   ?[place] [employer] department will go out to the house to do a wellfare check and try and make contact with girlfriend [name].   ? ? ? ? ?Expected Discharge Plan:  (TBD) ?Barriers to Discharge: Continued Medical Work up ? ?Expected Discharge Plan and Services ?Expected Discharge Plan:  (TBD) ?  ?  ?  ?  ?                ?  ?  ?  ?  ?  ?  ?  ?  ?  ?  ? ? ?Social Determinants of Health (SDOH) Interventions ?  ? ?Readmission Risk Interventions ?No flowsheet data found. ? ?

## 2022-01-31 NOTE — Progress Notes (Signed)
NAME:  Kenneth Benson, MRN:  [id number], DOB:  08/01/1961, LOS: 44 ADMISSION DATE:  01/21/2022, CONSULTATION DATE: 01/21/2022 REFERRING MD: Dr. Cheri Fowler, CHIEF COMPLAINT: Shortness of Breath    History of Present Illness:  This is a 61 y.o. male who presented to [facility] ER via EMS on 02/24 with c/o shortness of breath, cough, wheezing, and congestion onset of symptoms several days prior to presentation. He reported using his inhaler without relief of symptoms.  EMS reported pt severely hypoxic with O2 sats in the 60's.  He received duoneb's x2 and 125 mg of iv solumedrol en route to the ER.  All hx obtained from chart review pt currently sedated and mechanically intubated with no family at bedside.   ED Course Upon arrival to the ER pt noted to be in moderate respiratory distress with diffuse wheezes per ED provider notes.  CXR, COVID-19/Influenza A&B, and pct negative.  VBG revealed pH 7.17/pCO2 99/bicarb 36.1.  EKG revealed sinus tachycardia hr 140 without ST elevation/depression and troponin 9.  He received duoneb x3 and 2 g of iv magnesium.  He was subsequently placed on Bipap with initial improvement of respiratory status.  However, respiratory status rapidly declined and pt required mechanical intubation.  He received an additional 2 g of iv magnesium and duoneb post intubation.  PCCM team contacted for ICU admission.  Pertinent  Medical History  COPD Current Smoker  Asthma   Significant Hospital Events: Including procedures, antibiotic start and stop dates in addition to other pertinent events   02/24: Pt admitted to ICU with acute on chronic hypercapnic hypoxic respiratory failure secondary to asthma and COPD exacerbation requiring mechanical intubation  02/25: Remains intubated, vent changes allowed 02/27: Remains intubated, slowly weaning vent support. Respiratory culture with normal respiratory flora 03/01: Cardizem and Heparin gtts initiated by Cardiology for AF. Unable to tolerate  decrease in sedation due to Tachycardia and hypoxia. 03/02: Sedation lightened for WUA, delirious but able to follow simple commands.  CXR concerning for possible pulmonary edema, diuresing with Diamox + Metolazone.  Heparin gtt changed to treatment dose Lovenox 03/4: Pt severely hypoxic overnight with elevated peak pressures requiring following ventilator changes: FiO2 90% and PEEP 10. ETT exchanged.  Amiodarone bolus followed by drip started due to atrial fibrillation with rvr 3/5: continued difficulty achieving rate control; remains on high vent settings 01/31/22- reduced sedation with dc of fentanyl and minimal propofol with initiation of ketamine due to status asthmaticus in context of mixed viral and bacterial LRTI  Interim History / Subjective:  ETT exchanged yesterday. On 75% FiO2 with PEEP 10 this AM. Peak pressures are improved and flow volume loops show improving obstruction as well, but still quite obstructed. Hypercarbia is improved and closer to baseline. Continued difficulties with maintaining rate control overnight requiring Cardizem bolus; on amiodarone infusion. Remains deeply sedated; generally compliant with vent.  Objective   Blood pressure 116/71, pulse (!) 146, temperature (!) 101.1 F (38.4 C), resp. rate 18, height 6' 0.01" (1.829 m), weight 103 kg, SpO2 (!) 87 %.    Vent Mode: PRVC FiO2 (%):  [60 %-75 %] 60 % Set Rate:  [12 bmp-18 bmp] 18 bmp Vt Set:  [630 mL] 630 mL PEEP:  [8 cmH20] 8 cmH20 Plateau Pressure:  [21 cmH20] 21 cmH20   During rounds was able to switch patient to PRVC: Vent Mode: PRVC FiO2 (%):  [60 %-75 %] 60 % Set Rate:  [12 bmp-18 bmp] 18 bmp Vt Set:  [630 mL] 630 mL  PEEP:  [8 cmH20] [place] Pressure:  [21 cmH20] 21 cmH20   Intake/Output Summary (Last 24 hours) at 01/31/2022 1002 Last data filed at 01/31/2022 0900 Gross per 24 hour  Intake 2067.99 ml  Output 3550 ml  Net -1482.01 ml    Filed Weights   01/27/22 0500 01/28/22 0456  01/31/22 0500  Weight: 100.4 kg 102.2 kg 103 kg   Examination: General: Acutely ill appearing male, sedated and mechanically ventilated, in NAD Lungs: rhonchi with inspiratory and expiratory wheezing  Cardiovascular: irregularly irregular, no M/R/G, 2+ radial/2+ distal pulses, 1+ pitting edema of BLE Abdomen: +BS x4, obese, soft, non tender, non distended  Neuro: Heavily sedated RASS -4 to 5 GU: indwelling foley draining clear yellow urine   Resolved Hospital Problem list   Acute kidney injury likely due to ATN Mild hyponatremia, hypochloremia  Assessment & Plan:   Acute on chronic hypoxic hypercapnic respiratory failure Status asthmaticus Rhinovirus bronchiolitis Concern for possible VAP~respiratory culture 03/3 moderate gram+ cocci in pair and moderate gram negative rods -Full vent support, implement lung protective strategies -Plateau pressures less than 30 cm H20 -Wean FiO2 & PEEP as tolerated to maintain O2 sats >88% -Follow intermittent Chest X-ray & ABG as needed -Spontaneous Breathing Trials when respiratory parameters met and mental status permits -Implement VAP Bundle -Prn vecuronium for vent dyssynchrony  -CT Chest today -Continue frequent Duo-Nebs, Brovana, Pulmicort nebs -Continue methylpred 60 mg IV q8h  -Sent IgE, Strogyloides Ab yesterday given persistent eos on differential -Continue cefepime and vancomycin pending respiratory culture results  -Pt will likely need tracheostomy, however fever and worsened respiratory status precludes trach at this time\ Metapneumovirus NOT DETECTED NOT DETECTED   Rhinovirus / Enterovirus NOT DETECTED DETECTED Abnormal    Influenza A NOT DETECTED NOT DETECTED    0 Result Notes    Component 3 d ago  Specimen Description TRACHEAL ASPIRATE  Performed at [facility], [address] [place], [place] [address]   Special Requests NONE  Performed at [facility], [address] [place], [place] [address]    Gram Stain FEW WBC PRESENT, PREDOMINANTLY MONONUCLEAR  MODERATE GRAM NEGATIVE RODS  FEW GRAM POSITIVE COCCI IN PAIRS   Culture ABUNDANT HAEMOPHILUS INFLUENZAE  BETA LACTAMASE NEGATIVE  Performed at [facility], [place] [address], [place], [place] [address]   Report Status 01/31/2022 FINAL      Cardiogenic pulmonary edema Recurrent atrial fibrillation with rvr Intermittent ventricular bigeminy with frequent PVC's  TTE 01/21/22:  Left ventricular ejection fraction, by estimation, is 60  to 65%. The left ventricle has normal function. The left ventricle has no  regional wall motion abnormalities. The left ventricular internal cavity  size was mildly dilated. There is  borderline concentric left ventricular hypertrophy. The interventricular  septum is flattened in diastole ('D' shaped left ventricle), consistent with right ventricular volume overload. Left ventricular diastolic parameters were normal.  -Continuous telemetry monitoring  -Continue amiodarone gtt; re-bolus today -Start scheduled Cardizem PO -Awaiting formal read of bubble study -Diurese for goal 1L negative today -Replace electrolytes as indicated  -Monitor UOP  -Continue therapeutic lovenox -Cardiology consulted appreciate input   Constipation -Continue current bowel regimen   Sedation needs in the setting of mechanical ventilation -Maintain a RASS goal of -4 to -5 due to worsening hypoxia  -Fentanyl and Propofol gtts along with prn vecuronium as needed to maintain RASS goal  Hyperglycemia, likely due to critical illness and steroids Hgb A1c 4.9 -CBG's q4h; Target range of  140 to 180 -SSI -Follow ICU Hypo/Hyperglycemia protocol  Best Practice (right click and "Reselect all SmartList Selections" daily)   Diet/type: NPO, tube feeds DVT prophylaxis: Therapeutic Lovenox  GI prophylaxis: PPI Lines: Central line and Arterial Line; will d/c CVC today and place PICC Foley:  Yes, and it is still needed Code Status:   full code Last date of multidisciplinary goals of care discussion [01/27/2022]  Significant other [name] Hostetter is only current contact. Need further clarification on status of additional family members to determine decision-maker. Labs   CBC: Recent Labs  Lab 01/27/22 0359 01/28/22 0446 01/29/22 0420 01/30/22 0531 01/31/22 0500  WBC 10.0 8.9   9.2 9.6   9.7 8.7 8.6  NEUTROABS  --  7.1 6.7 7.5  --   HGB 12.3* 12.6*   12.5* 13.8   13.7 12.5* 12.0*  HCT 40.7 41.1   40.7 44.8   44.1 40.6 38.1*  MCV 100.2* 99.8   98.8 98.0   97.1 96.9 98.2  PLT 127* 176   166 198   182 150 153     Basic Metabolic Panel: Recent Labs  Lab 01/26/22 0338 01/27/22 0359 01/27/22 2235 01/28/22 0446 01/29/22 0420 01/30/22 0531 01/31/22 0500  NA 144 143 143 141 144 143 142  K 4.3 4.2 4.2 4.2 3.6 4.2 3.7  CL 98 101 103 101 98 99 96*  CO2 39* 39* 37* 35* 39* 37* 38*  GLUCOSE 149* 155* 156* 137* 120* 151* 202*  BUN 39* 42* 39* 39* 46* 46* 48*  CREATININE 0.78 0.82 0.73 0.74 0.85 0.78 0.88  CALCIUM 8.5* 8.5* 8.6* 8.6* 8.4* 8.2* 7.8*  MG 2.3 2.3 2.2  --  2.1 2.7* 2.3  PHOS 4.2 3.6  --   --  4.0 3.3 2.4*    GFR: Estimated Creatinine Clearance: 112.2 mL/min (by C-G formula based on SCr of 0.88 mg/dL). Recent Labs  Lab 01/28/22 0446 01/29/22 0420 01/30/22 0531 01/31/22 0500  PROCALCITON  --  <0.10  --   --   WBC 8.9   9.2 9.6   9.7 8.7 8.6     Liver Function Tests: Recent Labs  Lab 01/29/22 0420  AST 13*  ALT 20  ALKPHOS 40  BILITOT 0.7  PROT 7.1  ALBUMIN 2.8*    No results for input(s): LIPASE, AMYLASE in the last 168 hours. No results for input(s): AMMONIA in the last 168 hours.  ABG    Component Value Date/Time   PHART 7.49 (H) 01/30/2022 2034   PCO2ART 50 (H) 01/30/2022 2034   PO2ART 66 (L) 01/30/2022 2034   HCO3 38.1 (H) 01/30/2022 2034   O2SAT 96.8 01/30/2022 2034      Coagulation Profile: Recent Labs  Lab 01/26/22 0950  INR 1.1     Cardiac Enzymes: No  results for input(s): CKTOTAL, CKMB, CKMBINDEX, TROPONINI in the last 168 hours.  HbA1C: Hgb A1c MFr Bld  Date/Time Value Ref Range Status  01/21/2022 02:36 PM 4.9 4.8 - 5.6 % Final    Comment:    (NOTE) Pre diabetes:          5.7%-6.4%  Diabetes:              >6.4%  Glycemic control for   <7.0% adults with diabetes     CBG: Recent Labs  Lab 01/30/22 1543 01/30/22 1939 01/30/22 2340 01/31/22 0351 01/31/22 0724  GLUCAP 125* 151* 139* 125* 124*     Review of Systems:   Unable to assess pt mechanically  ventilated  Allergies No Known Allergies   Scheduled Meds:  arformoterol  15 mcg Nebulization BID   budesonide (PULMICORT) nebulizer solution  0.25 mg Nebulization BID   chlorhexidine gluconate (MEDLINE KIT)  15 mL Mouth Rinse BID   Chlorhexidine Gluconate Cloth  6 each Topical Q0600   diltiazem  60 mg Per Tube Q6H   enoxaparin (LOVENOX) injection  1 mg/kg Subcutaneous BID   feeding supplement (PROSource TF)  45 mL Per Tube TID   free water  30 mL Per Tube Q4H   insulin aspart  0-15 Units Subcutaneous Q4H   ipratropium-albuterol  3 mL Nebulization Q4H   lactulose  20 g Per Tube Daily   mouth rinse  15 mL Mouth Rinse 10 times per day   methylPREDNISolone (SOLU-MEDROL) injection  60 mg Intravenous Q8H   pantoprazole (PROTONIX) IV  40 mg Intravenous Q24H   polyethylene glycol  17 g Per Tube Daily   senna-docusate  1 tablet Per Tube BID   sodium chloride flush  10-40 mL Intracatheter Q12H   sodium chloride flush  10-40 mL Intracatheter Q12H   Continuous Infusions:  sodium chloride Stopped (01/25/22 1112)   amiodarone 30 mg/hr (01/30/22 2253)   ceFEPime (MAXIPIME) IV 2 g (01/31/22 0833)   feeding supplement (VITAL HIGH PROTEIN) 1,000 mL (01/28/22 1048)   fentaNYL infusion INTRAVENOUS 250 mcg/hr (01/31/22 0838)   propofol (DIPRIVAN) infusion 45 mcg/kg/min (01/31/22 0837)   PRN Meds:.acetaminophen (TYLENOL) oral liquid 160 mg/5 mL, albuterol, docusate, fentaNYL  (SUBLIMAZE) injection, fentaNYL (SUBLIMAZE) injection, metoprolol tartrate, midazolam, ondansetron (ZOFRAN) IV, polyethylene glycol, sodium chloride flush, sodium chloride flush, vecuronium   Critical care provider statement:   Total critical care time: 33 minutes   Performed by: Lanney Gins MD   Critical care time was exclusive of separately billable procedures and treating other patients.   Critical care was necessary to treat or prevent imminent or life-threatening deterioration.   Critical care was time spent personally by me on the following activities: development of treatment plan with patient and/or surrogate as well as nursing, discussions with consultants, evaluation of patient's response to treatment, examination of patient, obtaining history from patient or surrogate, ordering and performing treatments and interventions, ordering and review of laboratory studies, ordering and review of radiographic studies, pulse oximetry and re-evaluation of patient's condition.    Ottie Glazier, M.D.  Pulmonary & Critical Care Medicine

## 2022-01-31 NOTE — Progress Notes (Signed)
Kenneth Benson called this afternoon from [phone]. Placed her on hold to transfer her to Dr. Mervyn Skeeters. Phone call became disconnected. Attempted to call number multiple times and went to voicemail and unable to leave message.  ?

## 2022-01-31 NOTE — Progress Notes (Signed)
Nutrition Follow Up Note  ? ?DOCUMENTATION CODES:  ? ?Obesity unspecified ? ?INTERVENTION:  ? ?Vital HP @60ml /hr + ProSource TF 66ml TID via tube  ? ?Propofol: 27.6 ml/hr- provides 728kcal/day  ? ?Free water flushes 22ml q4 hours ? ?Regimen provides 2288kcal/day, 159g/day protein and 1538ml/day of free water  ? ?NUTRITION DIAGNOSIS:  ? ?Inadequate oral intake related to inability to eat (pt sedated and ventilated) as evidenced by NPO status. ? ?GOAL:  ? ?Provide needs based on ASPEN/SCCM guidelines ? ?MONITOR:  ? ?Vent status, Labs, Weight trends, Skin, I & O's ? ?ASSESSMENT:  ? ?61 y/o male with h/o asthma, COPD and currently smoker who is admitted with severe COPD exacerbation. ? ?Pt remains sedated and ventilated. OGT in place. Tube feeds held over the weekend as RN reports suctioning tube feeds from pt's mouth. Will plan to restart tube feeds today. Pt has been tolerating feeds his entire admission. Pt continues to diurese. Per chart, pt is weight stable since admission. Pt + 1.0L on his I & Os. Pt with new concerns for VAP.  ? ?Medications reviewed and include: lovenox, lasix, insulin, lactulose, solu-medrol, protonix, miralax, senokot, cefepime, propofol  ? ?Labs reviewed: K 3.7 wnl, BUN 48(H), P 2.4(L), Mg 2.3 wnl ?Cbgs- 141, 124, 125 x 24 hrs ? ?Patient is currently intubated on ventilator support ?MV: 10.9 L/min ?Temp (24hrs), Avg:100.7 ?F (38.2 ?C), Min:100.2 ?F (37.9 ?C), Max:101.3 ?F (38.5 ?C) ? ?Propofol: 27.6 ml/hr- provides 728kcal/day  ? ?MAP- >28mmHg  ? ?UOP- 77m  ? ?Diet Order:   ? ?Diet Order   ? ?       ?  Diet NPO time specified  Diet effective now       ?  ? ?  ?  ? ?  ? ?EDUCATION NEEDS:  ? ?No education needs have been identified at this time ? ?Skin:  Skin Assessment: Reviewed RN Assessment ? ?Last BM:  3/6- Type 7 ? ?Height:  ? ?Ht Readings from Last 1 Encounters:  ?01/28/22 6' 0.01" (1.829 m)  ? ? ?Weight:  ? ?Wt Readings from Last 1 Encounters:  ?01/31/22 103 kg  ? ? ?Ideal Body  Weight:  80.9 kg ? ?BMI:  Body mass index is 30.79 kg/m?. ? ?Estimated Nutritional Needs:  ? ?Kcal:  2266kcal/day ? ?Protein:  >160g/day ? ?Fluid:  2.4-2.7L/day ? ?04/02/22 MS, RD, LDN ?Please refer to [facility] for RD and/or RD on-call/weekend/after hours pager ? ? ?

## 2022-01-31 NOTE — Consult Note (Signed)
PHARMACY CONSULT NOTE ? ?Pharmacy Consult for Electrolyte Monitoring and Replacement  ? ?Recent Labs: ?Potassium (mmol/L)  ?Date Value  ?01/31/2022 3.7  ? ?Magnesium (mg/dL)  ?Date Value  ?01/31/2022 2.3  ? ?Calcium (mg/dL)  ?Date Value  ?01/31/2022 7.8 (L)  ? ?Albumin (g/dL)  ?Date Value  ?01/29/2022 2.8 (L)  ? ?Phosphorus (mg/dL)  ?Date Value  ?01/31/2022 2.4 (L)  ? ?Sodium (mmol/L)  ?Date Value  ?01/31/2022 142  ? ?Corrected Ca: 8.76 mg/dL ? ?Assessment: ?Patient is a 61 y/o M with medical history including COPD / asthma and tobacco abuse who presented to the ED 2/24 with acute respiratory failure secondary to asthma / COPD exacerbation. Patient ultimately required intubation. Disposition is the ICU. Pharmacy consulted to assist with electrolyte monitoring and replacement as indicated. ? ?Nutrition:  ?Vital HP at 45ml/hr + ProSource TF 35ml TID via tube  ?Free water flushes 40mL q4 hours ? ?Goal of Therapy:  ?Potassium 4.0 - 5.1 mmol/L ?Magnesium 2.0 - 2.4 mg/dL ?All Other Electrolytes within normal limits ? ?Plan:  ?40 mEq KCl per tube x 1 ?Follow-up electrolytes with AM labs  ? ?Lowella Bandy, PharmD ?Clinical Pharmacist ?01/31/2022 ?7:06 AM ? ? ? ?

## 2022-01-31 NOTE — Progress Notes (Signed)
Patients heart rate 130-150's. Metoprolol prn given. Patient febrile, Tylenol given through OG. During rounds patient continues to run fever, IV tylenol given. Orders to start ketamine and titrate down fentanyl. Continue to assess.  ?

## 2022-01-31 NOTE — Progress Notes (Signed)
Notified Dana NP regarding patients continued temp of 38.5. Orders to place ice packs axillary/groin. Notified fentanyl titrated off and decreasing propofol. Ketamine infusing. Patient continues to be a rass of -5. Continue to assess.  ?

## 2022-01-31 NOTE — Progress Notes (Signed)
[name] Cardiology  Patient ID: Kenneth Benson MRN: [id number] DOB/AGE: 1961-07-15 61 y.o.   Admit date: 01/21/2022 Referring Physician Dr. Valora Piccolo Primary Physician  Primary Cardiologist  Reason for Consultation EKG changes   HPI: The patient is a 61 year old male with a past medical history notable for asthma, COPD, and current tobacco use who presented to [facility] ED in the early morning hours complaining of shortness of breath with worsening cough and congestion.  Per EMS reporting the patient's O2 sats were in the 60s and he was given 2 DuoNebs and Solu-Medrol on route.  He presented to the ED in moderate respiratory distress with diffuse wheezing and had chest tightness without chest pain.  He was given DuoNebs x3 and 2 g of IV mag and was subsequently placed on BiPAP and eventually required intubation due to worsening respiratory status.  Cardiology is consulted because his EKG changes and ventricular bigeminy on admission with periods of breakthrough new AF with RVR.   Interval History: -febrile overnight, CT head shows pansinusitis and LOM -back in AF with RVR, 107 during interview -remains intubated and sedated without family at bedside.   Vitals:   01/31/22 0700 01/31/22 0809 01/31/22 0818 01/31/22 0900  BP: 99/60 112/70  116/71  Pulse: (!) 117  (!) 129 (!) 146  Resp: 18 18 18 18   Temp: (!) 100.8 F (38.2 C) (!) 100.9 F (38.3 C)  (!) 101.1 F (38.4 C)  TempSrc:      SpO2: (!) 88% 90% (!) 88% (!) 87%  Weight:      Height:         Intake/Output Summary (Last 24 hours) at 01/31/2022 [id number] Last data filed at 01/31/2022 0900 Gross per 24 hour  Intake 2067.99 ml  Output 3550 ml  Net -1482.01 ml     PHYSICAL EXAM General: Acutely ill-appearing middle-aged Caucasian male, intubated in the ICU  HEENT:  Normocephalic and atraumatic. Neck:   No JVD.  Lungs: Intubated. Poor air movement, coarse breath sounds and without wheezes  Heart: irregularly irregular rate and rhythm.  Normal S1 and S2 without gallops or murmurs. Radial & DP pulses 2+ bilaterally. Abdomen: Soft, obese Msk: Normal tone for age. Extremities: Warm and well perfused. No clubbing, cyanosis.  Trace upper and lower extremity edema bilaterally with SCDs in place Neuro: Sedated. Psych: Unable to assess due to sedation   LABS: Basic Metabolic Panel: Recent Labs    01/30/22 0531 01/31/22 0500  NA 143 142  K 4.2 3.7  CL 99 96*  CO2 37* 38*  GLUCOSE 151* 202*  BUN 46* 48*  CREATININE 0.78 0.88  CALCIUM 8.2* 7.8*  MG 2.7* 2.3  PHOS 3.3 2.4*    Liver Function Tests: Recent Labs    01/29/22 0420  AST 13*  ALT 20  ALKPHOS 40  BILITOT 0.7  PROT 7.1  ALBUMIN 2.8*    No results for input(s): LIPASE, AMYLASE in the last 72 hours. CBC: Recent Labs    01/29/22 0420 01/30/22 0531 01/31/22 0500  WBC 9.6   9.7 8.7 8.6  NEUTROABS 6.7 7.5  --   HGB 13.8   13.7 12.5* 12.0*  HCT 44.8   44.1 40.6 38.1*  MCV 98.0   97.1 96.9 98.2  PLT 198   182 150 153    Cardiac Enzymes: No results for input(s): CKTOTAL, CKMB, CKMBINDEX, TROPONINI in the last 72 hours. BNP: Invalid input(s): POCBNP D-Dimer: No results for input(s): DDIMER in the last 72 hours. Hemoglobin A1C:  No results for input(s): HGBA1C in the last 72 hours. Fasting Lipid Panel: Recent Labs    01/31/22 0800  TRIG 90    Thyroid Function Tests: Recent Labs    01/30/22 0531  TSH 0.417    Anemia Panel: No results for input(s): VITAMINB12, FOLATE, FERRITIN, TIBC, IRON, RETICCTPCT in the last 72 hours.  CT HEAD WO CONTRAST (5MM)  Result Date: 01/30/2022 CLINICAL DATA:  61 year old male with shortness of breath, altered mental status. EXAM: CT HEAD WITHOUT CONTRAST TECHNIQUE: Contiguous axial images were obtained from the base of the skull through the vertex without intravenous contrast. RADIATION DOSE REDUCTION: This exam was performed according to the departmental dose-optimization program which includes automated  exposure control, adjustment of the mA and/or kV according to patient size and/or use of iterative reconstruction technique. COMPARISON:  Head CT 11/21/2004. FINDINGS: Brain: Cerebral volume has not significantly changed since 2005, normal for age. No midline shift, ventriculomegaly, mass effect, evidence of mass lesion, intracranial hemorrhage or evidence of cortically based acute infarction. Gray-white matter differentiation is within normal limits throughout the brain. Vascular: No suspicious intracranial vascular hyperdensity. Skull: No acute osseous abnormality identified. Sinuses/Orbits: Left tympanic cavity partially opacified. Right tympanic cavity is clear. Mild bilateral mastoid effusions. Sphenoid sinus fluid levels and bubbly opacity, and moderate to subtotal opacification of the other visible bilateral paranasal sinuses. Other: No acute orbit or scalp soft tissue finding. IMPRESSION: 1. Normal for age non contrast CT appearance of the brain. 2. Pan-sinusitis with sinus fluid levels, bilateral mastoid effusions, and partially opacified left middle ear. Consider Acute Sinusitis with Left Otitis Media. Electronically Signed   By: Genevie Ann M.D.   On: 01/30/2022 11:12   CT CHEST WO CONTRAST  Result Date: 01/30/2022 CLINICAL DATA:  Acute asthma exacerbation. Complains of shortness of breath, cough, wheezing and congestion. EXAM: CT CHEST WITHOUT CONTRAST TECHNIQUE: Multidetector CT imaging of the chest was performed following the standard protocol without IV contrast. RADIATION DOSE REDUCTION: This exam was performed according to the departmental dose-optimization program which includes automated exposure control, adjustment of the mA and/or kV according to patient size and/or use of iterative reconstruction technique. COMPARISON:  01/21/2022. FINDINGS: Cardiovascular: Heart size is normal. Mild aortic atherosclerosis. Coronary artery calcifications. No pericardial effusion. Mediastinum/Nodes: Normal  appearance of the thyroid gland. Status post intubation. ET tube tip is above the carina. The esophagus is within normal limits. NG tube is in place with tip below the GE junction. No enlarged supraclavicular, axillary, or mediastinal lymph nodes. Lungs/Pleura: Small bilateral pleural effusions are new from the previous exam. Bilateral lower lobe subpleural atelectasis in consolidation is noted. There has been interval development of numerous tree-in-bud nodules throughout the posterior upper lobes, right middle lobe and both lower lobes. No pneumothorax identified. No signs of interstitial edema. Upper Abdomen: No acute abnormality. Musculoskeletal: No chest wall mass or suspicious bone lesions identified. IMPRESSION: 1. Interval development of numerous small tree-in-bud nodules throughout the posterior upper lobes, right middle lobe and throughout both lower lobes. Imaging findings are concerning for inflammatory/infectious bronchiolitis and/or aspiration. 2. New small bilateral pleural effusions with bilateral lower lobe subpleural atelectasis and consolidation. 3. Coronary artery calcifications noted. 4. Aortic Atherosclerosis (ICD10-I70.0). Electronically Signed   By: Kerby Moors M.D.   On: 01/30/2022 11:01   DG Chest Port 1 View  Result Date: 01/30/2022 CLINICAL DATA:  61 year old male PICC line placement. EXAM: PORTABLE CHEST 1 VIEW COMPARISON:  Portable chest 01/29/2022. FINDINGS: Portable AP upright view at 0949 hours. Right  PICC line placed. Appears to terminates at the same level as the existing left IJ central line, upper SVC above the carina. Otherwise stable lines and tubes. Stable lung volumes and mediastinal contours. Ongoing confluent left lung base opacity partially obscuring the left hemidiaphragm. Stable ventilation. Negative visible bowel gas. No acute osseous abnormality identified. IMPRESSION: 1. Right PICC line placed, tip at the SVC level. Otherwise stable lines and tubes. 2. Stable  ventilation since yesterday with left lung base partial collapse or consolidation. Electronically Signed   By: Genevie Ann M.D.   On: 01/30/2022 10:04   DG Chest Port 1 View  Result Date: 01/29/2022 CLINICAL DATA:  OG tube placement. EXAM: PORTABLE CHEST 1 VIEW COMPARISON:  01/29/2022; 01/28/2022 FINDINGS: Grossly unchanged enlarged cardiac silhouette and mediastinal contours. Stable position of support apparatus. Including tip and side port of orogastric tube overlying expected location of the gastric fundus. Pulmonary vasculature remains indistinct with cephalization of flow. Grossly unchanged bilateral perihilar and medial basilar heterogeneous opacities. No new focal airspace opacities. No pleural effusion or pneumothorax. No acute osseous abnormalities. IMPRESSION: 1.  Stable positioning of support apparatus.  No pneumothorax. 2. Similar findings of pulmonary edema and perihilar/bibasilar opacities, atelectasis versus infiltrate. Electronically Signed   By: Sandi Mariscal M.D.   On: 01/29/2022 12:04   ECHOCARDIOGRAM LIMITED BUBBLE STUDY  Result Date: 01/30/2022    ECHOCARDIOGRAM LIMITED REPORT   Patient Name:   Kenneth Benson [facility] Date of Exam: 01/29/2022 Medical Rec #:  [id number]     Height:       72.0 in Accession #:    [id number]    Weight:       225.3 lb Date of Birth:  [birth year]    BSA:          2.241 m Patient Age:    45 years      BP:           147/87 mmHg Patient Gender: M             HR:           90 bpm. Exam Location:  [facility] Procedure: 2D Echo, Cardiac Doppler, Color Doppler and Saline Contrast Bubble            Study Indications:     Hypoxia [300808]  History:         Patient has prior history of Echocardiogram examinations. Risk                  Factors:Current Smoker.  Sonographer:     Alyse Low Roar Referring Phys:  [id number] ADAM ROSS SCHERTZ Diagnosing Phys: Isaias Cowman MD IMPRESSIONS  1. Left ventricular ejection fraction, by estimation, is 50 to 55%. The left ventricle has low normal function.  The left ventricle has no regional wall motion abnormalities.  2. Right ventricular systolic function is normal. The right ventricular size is normal.  3. The mitral valve is normal in structure. Trivial mitral valve regurgitation. No evidence of mitral stenosis.  4. The aortic valve is normal in structure. Aortic valve regurgitation is not visualized. No aortic stenosis is present.  5. The inferior vena cava is normal in size with greater than 50% respiratory variability, suggesting right atrial pressure of 3 mmHg.  6. Agitated saline contrast bubble study was negative, with no evidence of any interatrial shunt. FINDINGS  Left Ventricle: Left ventricular ejection fraction, by estimation, is 50 to 55%. The left ventricle has low normal function. The left ventricle has no regional wall  motion abnormalities. The left ventricular internal cavity size was normal in size. There is no left ventricular hypertrophy. Right Ventricle: The right ventricular size is normal. No increase in right ventricular wall thickness. Right ventricular systolic function is normal. Left Atrium: Left atrial size was normal in size. Right Atrium: Right atrial size was normal in size. Pericardium: There is no evidence of pericardial effusion. Mitral Valve: The mitral valve is normal in structure. Trivial mitral valve regurgitation. No evidence of mitral valve stenosis. Tricuspid Valve: The tricuspid valve is normal in structure. Tricuspid valve regurgitation is trivial. No evidence of tricuspid stenosis. Aortic Valve: The aortic valve is normal in structure. Aortic valve regurgitation is not visualized. No aortic stenosis is present. Aortic valve peak gradient measures 5.1 mmHg. Pulmonic Valve: The pulmonic valve was normal in structure. Pulmonic valve regurgitation is not visualized. No evidence of pulmonic stenosis. Aorta: The aortic root is normal in size and structure. Venous: The inferior vena cava is normal in size with greater than 50%  respiratory variability, suggesting right atrial pressure of 3 mmHg. IAS/Shunts: No atrial level shunt detected by color flow Doppler. Agitated saline contrast was given intravenously to evaluate for intracardiac shunting. Agitated saline contrast bubble study was negative, with no evidence of any interatrial shunt. LEFT VENTRICLE PLAX 2D LVIDd:         5.51 cm   Diastology LVIDs:         4.79 cm   LV e' medial:    6.09 cm/s LV PW:         1.01 cm   LV E/e' medial:  7.7 LV IVS:        1.10 cm   LV e' lateral:   6.64 cm/s LVOT diam:     1.80 cm   LV E/e' lateral: 7.0 LVOT Area:     2.54 cm  RIGHT VENTRICLE RV Mid diam:    3.17 cm RV S prime:     13.70 cm/s TAPSE (M-mode): 2.8 cm LEFT ATRIUM           Index        RIGHT ATRIUM           Index LA diam:      3.20 cm 1.43 cm/m   RA Area:     11.50 cm LA Vol (A4C): 23.2 ml 10.35 ml/m  RA Volume:   21.10 ml  9.41 ml/m  AORTIC VALVE                 PULMONIC VALVE AV Area (Vmax): 2.48 cm     PV Vmax:        1.24 m/s AV Vmax:        113.00 cm/s  PV Peak grad:   6.2 mmHg AV Peak Grad:   5.1 mmHg     RVOT Peak grad: 2 mmHg LVOT Vmax:      110.00 cm/s  AORTA Ao Asc diam: 3.10 cm MITRAL VALVE MV Area (PHT): 3.19 cm    SHUNTS MV Decel Time: 238 msec    Systemic Diam: 1.80 cm MV E velocity: 46.70 cm/s MV A velocity: 73.30 cm/s MV E/A ratio:  0.64 MV A Prime:    11.4 cm/s Isaias Cowman MD Electronically signed by Isaias Cowman MD Signature Date/Time: 01/30/2022/10:06:51 AM    Final    [place] EKG SITE RITE  Result Date: 01/29/2022 If Site Rite image not attached, placement could not be confirmed due to current cardiac rhythm.    Echo LVEF  60-65%  TELEMETRY: Atrial fibrillation 120-137 bpm overnight  ASSESSMENT AND PLAN:  Principal Problem:   Asthma exacerbation Active Problems:   COPD exacerbation (HCC)   Sepsis ([place])   Tobacco abuse   Acute respiratory failure with hypoxia and hypercapnia (HCC)   Acute on chronic respiratory failure with hypercapnia  (HCC)   Arterial line in place    1. Atrial fibrillation with RVR, compensatory for underlying respiratory failure, previously controlled on Cardizem drip, currently on amiodarone drip and PO cardizem on Lovenox for stroke prevention 2.  Frequent PVCs of uncertain clinical significance, normal left ventricular function 3.  Respiratory failure / asthma / COPD exacerbation / severe ARDS / pansinusitis intubated and on IV abx     Recommendations   1.  Agree with current therapy 2.  Continue amiodarone drip, Cardizem 30 mg every 6 hours for now.  3.  Continue Lovenox for stroke prevention 4.  Defer further cardiac diagnostics at this time  This patient's plan of care was discussed and created with Dr. Donnelly Angelica and he is in agreement.    Tristan Schroeder, PA-C 01/31/2022 9:49 AM

## 2022-01-31 NOTE — Consult Note (Addendum)
Consultation Note Date: 01/31/2022   Patient Name: Kenneth Benson  DOB: [birth year]  MRN: [id number]  Age / Sex: 61 y.o., male  PCP: Pcp, No Referring Physician: Ottie Glazier, MD  Reason for Consultation: Establishing goals of care  HPI/Patient Profile: 61 y.o. male  with past medical history of current smoker, asthma, COPD admitted on 01/21/2022 with acute on chronic hypercapnic hypoxic respiratory failure secondary to asthma and COPD exacerbation requiring intubation.   Clinical Assessment and Goals of Care: I have reviewed medical records including EPIC notes, labs and imaging, received report from RN, assessed the patient.  Kenneth Benson is lying quietly in bed.  He is intubated/ventilated and sedated.  There is no family at bedside at this time.  Detailed conference with attending and transition of care team related to social dynamics.   Kenneth Benson has no next of kin listed in demographics.  He has a friend, Scientist, research (medical), who medical staff have been unable to reach.  Transition of care team is working closely to locate next of kin.  Reaching out to [facility] communications to see if they have records, EMS, and [facility] department.  At this point, PMT is unable to have meaningful goals of care discussions.  We will continue to assist as possible.  PMT will continue to support holistically.  Conference with attending, bedside nursing staff, transition of care team related to patient condition, needs, goals of care, disposition.  No PCP or specialists identified for further clarification of next of kin.  If unable to reach family representative, it is recommended that temporary DSS guardianship be requested.  Under temporary guardianship, DSS social workers are unable to make life and death decisions.  It can take up to 6 weeks for permanent guardianship to be granted by the court.     HCPOA OTHER -transition of care team is working closely with local agencies to contact Kenneth Benson friend, Scientist, research (medical).  At this point, DSS social work for temporary guardianship would be recommended.  Unfortunately, DSS cannot make life or death decisions with temporary guardianship.  It may take 6 weeks for full guardianship to be granted by the court.   SUMMARY OF RECOMMENDATIONS   At this point full scope/full code by default   Code Status/Advance Care Planning: Full code  by default.  Symptom Management:  Per hospitalist, no additional needs at this time.  Palliative Prophylaxis:  Frequent Pain Assessment, Oral Care, and Turn Reposition  Additional Recommendations (Limitations, Scope, Preferences): Full Scope Treatment  Psycho-social/Spiritual:  Desire for further Chaplaincy support:no Additional Recommendations: Caregiving  Support/Resources and Referral to Community Resources   Prognosis:  Unable to determine, based on outcomes.  Discharge Planning:  To be determined, may benefit from LTAC, trach/PEG       Primary Diagnoses: Present on Admission:  COPD exacerbation ([place])  Asthma exacerbation  Sepsis ([place])  Tobacco abuse  Acute respiratory failure with hypoxia and hypercapnia (HCC)  Acute on chronic respiratory failure with hypercapnia ([name])   I have reviewed the  medical record, interviewed the patient and family, and examined the patient. The following aspects are pertinent.  Past Medical History:  Diagnosis Date   Asthma    Tobacco abuse    Social History   Socioeconomic History   Marital status: Single    Spouse name: Not on file   Number of children: Not on file   Years of education: Not on file   Highest education level: Not on file  Occupational History   Not on file  Tobacco Use   Smoking status: Every Day   Smokeless tobacco: Not on file  Substance and Sexual Activity   Alcohol use: No   Drug use: No   Sexual activity: Not on  file  Other Topics Concern   Not on file  Social History Narrative   Not on file   Social Determinants of Health   Financial Resource Strain: Not on file  Food Insecurity: Not on file  Transportation Needs: Not on file  Physical Activity: Not on file  Stress: Not on file  Social Connections: Not on file   History reviewed. No pertinent family history. Scheduled Meds:  arformoterol  15 mcg Nebulization BID   budesonide (PULMICORT) nebulizer solution  0.25 mg Nebulization BID   chlorhexidine gluconate (MEDLINE KIT)  15 mL Mouth Rinse BID   Chlorhexidine Gluconate Cloth  6 each Topical Q0600   diltiazem  60 mg Per Tube Q6H   enoxaparin (LOVENOX) injection  1 mg/kg Subcutaneous BID   feeding supplement (PROSource TF)  45 mL Per Tube TID   free water  30 mL Per Tube Q4H   furosemide  40 mg Intravenous Q12H   insulin aspart  0-15 Units Subcutaneous Q4H   ipratropium-albuterol  3 mL Nebulization Q4H   lactulose  20 g Per Tube Daily   mouth rinse  15 mL Mouth Rinse 10 times per day   methylPREDNISolone (SOLU-MEDROL) injection  60 mg Intravenous Q8H   pantoprazole (PROTONIX) IV  40 mg Intravenous Q24H   polyethylene glycol  17 g Per Tube Daily   senna-docusate  1 tablet Per Tube BID   sodium chloride flush  10-40 mL Intracatheter Q12H   sodium chloride flush  10-40 mL Intracatheter Q12H   Continuous Infusions:  sodium chloride Stopped (01/25/22 1112)   acetaminophen     amiodarone 30 mg/hr (01/30/22 2253)   ceFEPime (MAXIPIME) IV 2 g (01/31/22 6283)   feeding supplement (VITAL HIGH PROTEIN) 1,000 mL (01/28/22 1048)   fentaNYL infusion INTRAVENOUS 250 mcg/hr (01/31/22 0838)   ketamine (KETALAR) Adult IV Infusion     propofol (DIPRIVAN) infusion 45 mcg/kg/min (01/31/22 0837)   PRN Meds:.acetaminophen (TYLENOL) oral liquid 160 mg/5 mL, albuterol, docusate, fentaNYL (SUBLIMAZE) injection, fentaNYL (SUBLIMAZE) injection, metoprolol tartrate, midazolam, ondansetron (ZOFRAN) IV,  polyethylene glycol, sodium chloride flush, sodium chloride flush, vecuronium Medications Prior to Admission:  Prior to Admission medications   Medication Sig Start Date End Date Taking? Authorizing Provider  albuterol (PROVENTIL HFA;VENTOLIN HFA) 108 (90 BASE) MCG/ACT inhaler Inhale 2 puffs into the lungs every 6 (six) hours as needed for wheezing. Patient not taking: Reported on 01/21/2022    [provider]  ibuprofen (ADVIL,MOTRIN) 600 MG tablet Take 1 tablet (600 mg total) by mouth every 8 (eight) hours as needed. Patient not taking: Reported on 01/21/2022 10/01/15   Lisa Roca, MD   No Known Allergies Review of Systems  Unable to perform ROS: Intubated   Physical Exam Vitals and nursing note reviewed.  Constitutional:  General: He is not in acute distress.    Appearance: He is well-developed. He is not ill-appearing.  Cardiovascular:     Rate and Rhythm: Tachycardia present.  Pulmonary:     Comments: Intubated/ventilated, sedated Abdominal:     Palpations: Abdomen is soft.  Skin:    General: Skin is warm and dry.  Neurological:     Comments: Intubated/ventilated, sedated    Vital Signs: BP 104/74    Pulse (!) 128    Temp (!) 101.3 F (38.5 C)    Resp 18    Ht 6' 0.01" (1.829 m)    Wt 103 kg    SpO2 91%    BMI 30.79 kg/m  Pain Scale: CPOT   Pain Score: 0-No pain   SpO2: SpO2: 91 % O2 Device:SpO2: 91 % O2 Flow Rate: .O2 Flow Rate (L/min): 8 L/min  IO: Intake/output summary:  Intake/Output Summary (Last 24 hours) at 01/31/2022 1229 Last data filed at 01/31/2022 0900 Gross per 24 hour  Intake 2067.99 ml  Output 2550 ml  Net -482.01 ml    LBM: Last BM Date : 01/30/22 Baseline Weight: Weight: 102.1 kg Most recent weight: Weight: 103 kg     Palliative Assessment/Data:   Flowsheet Rows    Flowsheet Row Most Recent Value  Intake Tab   Referral Department Hospitalist  Unit at Time of Referral ICU  Palliative Care Primary Diagnosis Pulmonary  Date  Notified 01/27/22  Palliative Care Type New Palliative care  Reason for referral Clarify Goals of Care  Date of Admission 01/21/22  Date first seen by Palliative Care 01/31/22  # of days Palliative referral response time 4 Day(s)  # of days IP prior to Palliative referral 6  Clinical Assessment   Palliative Performance Scale Score 10%  Pain Max last 24 hours Not able to report  Pain Min Last 24 hours Not able to report  Dyspnea Max Last 24 Hours Not able to report  Dyspnea Min Last 24 hours Not able to report  Psychosocial & Spiritual Assessment   Palliative Care Outcomes        Time In: 0830 Time Out: 0925 Time Total: 55 minutes  Greater than 50%  of this time was spent counseling and coordinating care related to the above assessment and plan.  Signed by: Drue Novel, NP   Please contact Palliative Medicine Team phone at [phone] for questions and concerns.  For individual provider: See Shea Evans

## 2022-01-31 NOTE — Progress Notes (Signed)
[facility] ADULT ICU REPLACEMENT PROTOCOL ? ? ?The patient does apply for the [facility] Adult ICU Electrolyte Replacment Protocol based on the criteria listed below:  ? ?1.Exclusion criteria: TCTS patients, ECMO patients, and Dialysis patients ?2. Is GFR >/= 30 ml/min? Yes.    ?Patient's GFR today is >60 ?3. Is SCr </= 2? Yes.   ?Patient's SCr is 0.88 mg/dL ?4. Did SCr increase >/= 0.5 in 24 hours? No. ?5.Pt's weight >40kg  Yes.   ?6. Abnormal electrolyte(s): K  ?7. Electrolytes replaced per protocol ?8.  Call MD STAT for K+ </= 2.5, Phos </= 1, or Mag </= 1 ?Physician:  Roxan Hockey ? ?Kristle Wesch E Lynnie Koehler 01/31/2022 6:05 AM  ?

## 2022-01-31 NOTE — TOC Progression Note (Signed)
Transition of Care (TOC) - Progression Note  ? ? ?Patient Details  ?Name: Kenneth Benson ?MRN: [id number] ?Date of Birth: Mar 28, 1961 ? ?Transition of Care (TOC) CM/SW Contact  ?Allayne Butcher, RN ?Phone Number: ?01/31/2022, 5:03 PM ? ?Clinical Narrative:    ?RNCM never heard from [place] about the wellfare check- RNCM will reach back out tomorrow.  ? ? ?Expected Discharge Plan:  (TBD) ?Barriers to Discharge: Continued Medical Work up ? ?Expected Discharge Plan and Services ?Expected Discharge Plan:  (TBD) ?  ?  ?  ?  ?                ?  ?  ?  ?  ?  ?  ?  ?  ?  ?  ? ? ?Social Determinants of Health (SDOH) Interventions ?  ? ?Readmission Risk Interventions ?No flowsheet data found. ? ?

## 2022-01-31 NOTE — Progress Notes (Signed)
Patient -5 throughout the day. Per Dr. Mervyn Skeeters titrated fentanyl off and coming down on propofol. Ketamine ordered and infusing. Patients temp this am 101.5, at this time 100.9. Tube feeds restarted. Adequate urine output. Per Annabelle Harman NP increased amiodarone drip to 60 mg/hr. Continue to assess.  ?

## 2022-02-01 LAB — BASIC METABOLIC PANEL
Anion gap: 10 (ref 5–15)
BUN: 52 mg/dL — ABNORMAL HIGH (ref 6–20)
CO2: 37 mmol/L — ABNORMAL HIGH (ref 22–32)
Calcium: 7.7 mg/dL — ABNORMAL LOW (ref 8.9–10.3)
Chloride: 95 mmol/L — ABNORMAL LOW (ref 98–111)
Creatinine, Ser: 0.85 mg/dL (ref 0.61–1.24)
GFR, Estimated: >60 mL/min (ref >60)
Glucose, Bld: 187 mg/dL — ABNORMAL HIGH (ref 70–99)
Potassium: 3.7 mmol/L (ref 3.5–5.1)
Sodium: 142 mmol/L (ref 135–145)

## 2022-02-01 LAB — GLUCOSE, CAPILLARY
Glucose-Capillary: 113 mg/dL — ABNORMAL HIGH (ref 70–99)
Glucose-Capillary: 114 mg/dL — ABNORMAL HIGH (ref 70–99)
Glucose-Capillary: 117 mg/dL — ABNORMAL HIGH (ref 70–99)
Glucose-Capillary: 132 mg/dL — ABNORMAL HIGH (ref 70–99)
Glucose-Capillary: 148 mg/dL — ABNORMAL HIGH (ref 70–99)
Glucose-Capillary: 150 mg/dL — ABNORMAL HIGH (ref 70–99)
Glucose-Capillary: 167 mg/dL — ABNORMAL HIGH (ref 70–99)

## 2022-02-01 LAB — CBC
HCT: 41.3 % (ref 39.0–52.0)
Hemoglobin: 13.2 g/dL (ref 13.0–17.0)
MCH: 30.9 pg (ref 26.0–34.0)
MCHC: 32 g/dL (ref 30.0–36.0)
MCV: 96.7 fL (ref 80.0–100.0)
Platelets: 202 10*3/uL (ref 150–400)
RBC: 4.27 MIL/uL (ref 4.22–5.81)
RDW: 14.6 % (ref 11.5–15.5)
WBC: 11.3 10*3/uL — ABNORMAL HIGH (ref 4.0–10.5)
nRBC: 0 % (ref 0.0–0.2)

## 2022-02-01 LAB — MAGNESIUM: Magnesium: 2.4 mg/dL (ref 1.7–2.4)

## 2022-02-01 LAB — PHOSPHORUS: Phosphorus: 2.4 mg/dL — ABNORMAL LOW (ref 2.5–4.6)

## 2022-02-01 MED ORDER — POTASSIUM PHOSPHATES 15 MMOLE/5ML IV SOLN
15.0000 mmol | Freq: Once | INTRAVENOUS | Status: AC
  Administered 2022-02-01: 15 mmol via INTRAVENOUS
  Filled 2022-02-01: qty 5

## 2022-02-01 MED ORDER — DEXMEDETOMIDINE HCL IN NACL 400 MCG/100ML IV SOLN
0.4000 ug/kg/h | INTRAVENOUS | Status: DC
  Administered 2022-02-01: 1.2 ug/kg/h via INTRAVENOUS
  Administered 2022-02-01: 0.6 ug/kg/h via INTRAVENOUS
  Administered 2022-02-01 – 2022-02-02 (×4): 1.2 ug/kg/h via INTRAVENOUS
  Filled 2022-02-01 (×8): qty 100

## 2022-02-01 MED ORDER — DILTIAZEM HCL 30 MG PO TABS
90.0000 mg | ORAL_TABLET | Freq: Four times a day (QID) | ORAL | Status: DC
  Administered 2022-02-01 – 2022-02-05 (×18): 90 mg
  Filled 2022-02-01 (×7): qty 1
  Filled 2022-02-01: qty 3
  Filled 2022-02-01 (×10): qty 1

## 2022-02-01 MED ORDER — AMIODARONE HCL 200 MG PO TABS
200.0000 mg | ORAL_TABLET | Freq: Two times a day (BID) | ORAL | Status: DC
  Administered 2022-02-01 – 2022-02-05 (×9): 200 mg
  Filled 2022-02-01 (×9): qty 1

## 2022-02-01 MED ORDER — IBUPROFEN 100 MG/5ML PO SUSP
400.0000 mg | Freq: Once | ORAL | Status: AC
  Administered 2022-02-01: 400 mg
  Filled 2022-02-01: qty 20

## 2022-02-01 MED ORDER — POTASSIUM CHLORIDE 20 MEQ PO PACK
40.0000 meq | PACK | Freq: Once | ORAL | Status: AC
  Administered 2022-02-01: 40 meq
  Filled 2022-02-01: qty 2

## 2022-02-01 NOTE — Consult Note (Addendum)
PHARMACY CONSULT NOTE ? ?Pharmacy Consult for Electrolyte Monitoring and Replacement  ? ?Recent Labs: ?Potassium (mmol/L)  ?Date Value  ?02/01/2022 3.7  ? ?Magnesium (mg/dL)  ?Date Value  ?02/01/2022 2.4  ? ?Calcium (mg/dL)  ?Date Value  ?02/01/2022 7.7 (L)  ? ?Albumin (g/dL)  ?Date Value  ?01/29/2022 2.8 (L)  ? ?Phosphorus (mg/dL)  ?Date Value  ?02/01/2022 2.4 (L)  ? ?Sodium (mmol/L)  ?Date Value  ?02/01/2022 142  ? ?Corrected Ca: 8.76 mg/dL ? ?Assessment: ?Patient is a 61 y/o M with medical history including COPD / asthma and tobacco abuse who presented to the ED 2/24 with acute respiratory failure secondary to asthma / COPD exacerbation. Patient ultimately required intubation. Disposition is the ICU. Pharmacy consulted to assist with electrolyte monitoring and replacement as indicated. ? ?Nutrition:  ?Vital HP at 70ml/hr + ProSource TF 42ml TID via tube  ?Free water flushes 30mL q4 hours ? ?Diuretics: ?+ lasix 40mg  IV BID (3/6-3/10) [I/O: +1.5L; UOP 1.1>1.33ml/k/h] ? ?Goal of Therapy:  ?Potassium 4.0 - 5.1 mmol/L ?Magnesium 2.0 - 2.4 mg/dL ?All Other Electrolytes within normal limits ? ?Plan:  ?K 3.7>3.7 despite 40 mEq KCl per tube x1 yesterday, so slightly under Afib goal range. ?Will repeat 40 mEq KCl per tube x1 in addition to 11m K+ from [facility] today ?Phos:3.3>2.4 ?Replete with [facility] IV (supplies approx. K+) ?Follow-up electrolytes with AM labs  ? ? , PharmD, BCCP ?Clinical Pharmacist ?02/01/2022 8:24 AM ? ? ? ? ?

## 2022-02-01 NOTE — Progress Notes (Signed)
NAME:  Kenneth Benson, MRN:  [id number], DOB:  [birth year], LOS: 47 ADMISSION DATE:  01/21/2022, CONSULTATION DATE: 01/21/2022 REFERRING MD: Dr. Cheri Fowler, CHIEF COMPLAINT: Shortness of Breath    History of Present Illness:  This is a 61 y.o. male who presented to [facility] ER via EMS on 02/24 with c/o shortness of breath, cough, wheezing, and congestion onset of symptoms several days prior to presentation. He reported using his inhaler without relief of symptoms.  EMS reported pt severely hypoxic with O2 sats in the 60's.  He received duoneb's x2 and 125 mg of iv solumedrol en route to the ER.  All hx obtained from chart review pt currently sedated and mechanically intubated with no family at bedside.   ED Course Upon arrival to the ER pt noted to be in moderate respiratory distress with diffuse wheezes per ED provider notes.  CXR, COVID-19/Influenza A&B, and pct negative.  VBG revealed pH 7.17/pCO2 99/bicarb 36.1.  EKG revealed sinus tachycardia hr 140 without ST elevation/depression and troponin 9.  He received duoneb x3 and 2 g of iv magnesium.  He was subsequently placed on Bipap with initial improvement of respiratory status.  However, respiratory status rapidly declined and pt required mechanical intubation.  He received an additional 2 g of iv magnesium and duoneb post intubation.  PCCM team contacted for ICU admission.  02/01/22- patient remains tachyarrythymic s/p amiodarone IV without response, reviewed with cardiology team.  Will switch sedation to precedex. Today. I spoke with GF [name] today and she was able to provide to [place] patients Sister contact information which has been uploaded to her chart.  Pertinent  Medical History  COPD Current Smoker  Asthma   Significant Hospital Events: Including procedures, antibiotic start and stop dates in addition to other pertinent events   02/24: Pt admitted to ICU with acute on chronic hypercapnic hypoxic respiratory failure secondary to asthma and COPD  exacerbation requiring mechanical intubation  02/25: Remains intubated, vent changes allowed 02/27: Remains intubated, slowly weaning vent support. Respiratory culture with normal respiratory flora 03/01: Cardizem and Heparin gtts initiated by Cardiology for AF. Unable to tolerate decrease in sedation due to Tachycardia and hypoxia. 03/02: Sedation lightened for WUA, delirious but able to follow simple commands.  CXR concerning for possible pulmonary edema, diuresing with Diamox + Metolazone.  Heparin gtt changed to treatment dose Lovenox 03/4: Pt severely hypoxic overnight with elevated peak pressures requiring following ventilator changes: FiO2 90% and PEEP 10. ETT exchanged.  Amiodarone bolus followed by drip started due to atrial fibrillation with rvr 3/5: continued difficulty achieving rate control; remains on high vent settings 01/31/22- reduced sedation with dc of fentanyl and minimal propofol with initiation of ketamine due to status asthmaticus in context of mixed viral and bacterial LRTI  Interim History / Subjective:  ETT exchanged yesterday. On 75% FiO2 with PEEP 10 this AM. Peak pressures are improved and flow volume loops show improving obstruction as well, but still quite obstructed. Hypercarbia is improved and closer to baseline. Continued difficulties with maintaining rate control overnight requiring Cardizem bolus; on amiodarone infusion. Remains deeply sedated; generally compliant with vent.  Objective   Blood pressure 120/67, pulse (!) 117, temperature (!) 101.2 F (38.4 C), temperature source Bladder, resp. rate 20, height 6' 0.01" (1.829 m), weight 103 kg, SpO2 92 %.    Vent Mode: PRVC FiO2 (%):  [60 %] 60 % Set Rate:  [18 bmp] 18 bmp Vt Set:  [630 mL] 630 mL PEEP:  [8 cmH20]  8 cmH20   During rounds was able to switch patient to PRVC: Vent Mode: PRVC FiO2 (%):  [60 %] 60 % Set Rate:  [18 bmp] 18 bmp Vt Set:  [630 mL] 630 mL PEEP:  [8 cmH20] 8 cmH20   Intake/Output  Summary (Last 24 hours) at 02/01/2022 1116 Last data filed at 02/01/2022 1044 Gross per 24 hour  Intake 3815.37 ml  Output 3250 ml  Net 565.37 ml    Filed Weights   01/27/22 0500 01/28/22 0456 01/31/22 0500  Weight: 100.4 kg 102.2 kg 103 kg   Examination: General: Acutely ill appearing male, sedated and mechanically ventilated, in NAD Lungs: rhonchi with inspiratory and expiratory wheezing  Cardiovascular: irregularly irregular, no M/R/G, 2+ radial/2+ distal pulses, 1+ pitting edema of BLE Abdomen: +BS x4, obese, soft, non tender, non distended  Neuro: Heavily sedated RASS -4 to 5 GU: indwelling foley draining clear yellow urine   Resolved Hospital Problem list   Acute kidney injury likely due to ATN Mild hyponatremia, hypochloremia  Assessment & Plan:   Acute on chronic hypoxic hypercapnic respiratory failure Status asthmaticus Rhinovirus bronchiolitis Concern for possible VAP~respiratory culture 03/3 moderate gram+ cocci in pair and moderate gram negative rods -Full vent support, implement lung protective strategies -Plateau pressures less than 30 cm H20 -Wean FiO2 & PEEP as tolerated to maintain O2 sats >88% -Follow intermittent Chest X-ray & ABG as needed -Spontaneous Breathing Trials when respiratory parameters met and mental status permits -Implement VAP Bundle -Prn vecuronium for vent dyssynchrony  -CT Chest today -Continue frequent Duo-Nebs, Brovana, Pulmicort nebs -Continue methylpred 60 mg IV q8h  -Sent IgE, Strogyloides Ab yesterday given persistent eos on differential -Continue cefepime and vancomycin pending respiratory culture results  -Pt will likely need tracheostomy, however fever and worsened respiratory status precludes trach at this time\ Metapneumovirus NOT DETECTED NOT DETECTED   Rhinovirus / Enterovirus NOT DETECTED DETECTED Abnormal    Influenza A NOT DETECTED NOT DETECTED    0 Result Notes    Component 3 d ago  Specimen Description TRACHEAL  ASPIRATE  Performed at [facility], [address], [place], [place] [address]   Special Requests NONE  Performed at [facility], [address] [place], [place] [address]   Gram Stain FEW WBC PRESENT, PREDOMINANTLY MONONUCLEAR  MODERATE GRAM NEGATIVE RODS  FEW GRAM POSITIVE COCCI IN PAIRS   Culture ABUNDANT HAEMOPHILUS INFLUENZAE  BETA LACTAMASE NEGATIVE  Performed at [facility], [place] [address], [place], [place] [address]   Report Status 01/31/2022 FINAL      Cardiogenic pulmonary edema Recurrent atrial fibrillation with rvr Intermittent ventricular bigeminy with frequent PVC's  TTE 01/21/22:  Left ventricular ejection fraction, by estimation, is 60  to 65%. The left ventricle has normal function. The left ventricle has no  regional wall motion abnormalities. The left ventricular internal cavity  size was mildly dilated. There is  borderline concentric left ventricular hypertrophy. The interventricular  septum is flattened in diastole ('D' shaped left ventricle), consistent with right ventricular volume overload. Left ventricular diastolic parameters were normal.  -Continuous telemetry monitoring  -Continue amiodarone gtt; re-bolus today -Start scheduled Cardizem PO -Awaiting formal read of bubble study -Diurese for goal 1L negative today -Replace electrolytes as indicated  -Monitor UOP  -Continue therapeutic lovenox -Cardiology consulted appreciate input   Constipation -Continue current bowel regimen   Sedation needs in the setting of mechanical ventilation -Maintain a RASS goal of -4 to -5 due to worsening hypoxia  -Fentanyl  and Propofol gtts along with prn vecuronium as needed to maintain RASS goal  Hyperglycemia, likely due to critical illness and steroids Hgb A1c 4.9 -CBG's q4h; Target range of 140 to 180 -SSI -Follow ICU Hypo/Hyperglycemia protocol  Best Practice (right click and "Reselect all SmartList Selections" daily)   Diet/type:  NPO, tube feeds DVT prophylaxis: Therapeutic Lovenox  GI prophylaxis: PPI Lines: Central line and Arterial Line; will d/c CVC today and place PICC Foley:  Yes, and it is still needed Code Status:  full code Last date of multidisciplinary goals of care discussion [01/27/2022]  Significant other [name] Hostetter is only current contact. Need further clarification on status of additional family members to determine decision-maker. Labs   CBC: Recent Labs  Lab 01/28/22 0446 01/29/22 0420 01/30/22 0531 01/31/22 0500 02/01/22 0500  WBC 8.9   9.2 9.6   9.7 8.7 8.6 11.3*  NEUTROABS 7.1 6.7 7.5  --   --   HGB 12.6*   12.5* 13.8   13.7 12.5* 12.0* 13.2  HCT 41.1   40.7 44.8   44.1 40.6 38.1* 41.3  MCV 99.8   98.8 98.0   97.1 96.9 98.2 96.7  PLT 176   166 198   182 150 153 202     Basic Metabolic Panel: Recent Labs  Lab 01/27/22 0359 01/27/22 2235 01/28/22 0446 01/29/22 0420 01/30/22 0531 01/31/22 0500 02/01/22 0500  NA 143 143 141 144 143 142 142  K 4.2 4.2 4.2 3.6 4.2 3.7 3.7  CL 101 103 101 98 99 96* 95*  CO2 39* 37* 35* 39* 37* 38* 37*  GLUCOSE 155* 156* 137* 120* 151* 202* 187*  BUN 42* 39* 39* 46* 46* 48* 52*  CREATININE 0.82 0.73 0.74 0.85 0.78 0.88 0.85  CALCIUM 8.5* 8.6* 8.6* 8.4* 8.2* 7.8* 7.7*  MG 2.3 2.2  --  2.1 2.7* 2.3 2.4  PHOS 3.6  --   --  4.0 3.3 2.4* 2.4*    GFR: Estimated Creatinine Clearance: 116.2 mL/min (by C-G formula based on SCr of 0.85 mg/dL). Recent Labs  Lab 01/29/22 0420 01/30/22 0531 01/31/22 0500 02/01/22 0500  PROCALCITON <0.10  --   --   --   WBC 9.6   9.7 8.7 8.6 11.3*     Liver Function Tests: Recent Labs  Lab 01/29/22 0420  AST 13*  ALT 20  ALKPHOS 40  BILITOT 0.7  PROT 7.1  ALBUMIN 2.8*    No results for input(s): LIPASE, AMYLASE in the last 168 hours. No results for input(s): AMMONIA in the last 168 hours.  ABG    Component Value Date/Time   PHART 7.49 (H) 01/30/2022 2034   PCO2ART 50 (H) 01/30/2022 2034    PO2ART 66 (L) 01/30/2022 2034   HCO3 38.1 (H) 01/30/2022 2034   O2SAT 96.8 01/30/2022 2034      Coagulation Profile: Recent Labs  Lab 01/26/22 0950  INR 1.1     Cardiac Enzymes: No results for input(s): CKTOTAL, CKMB, CKMBINDEX, TROPONINI in the last 168 hours.  HbA1C: Hgb A1c MFr Bld  Date/Time Value Ref Range Status  01/21/2022 02:36 PM 4.9 4.8 - 5.6 % Final    Comment:    (NOTE) Pre diabetes:          5.7%-6.4%  Diabetes:              >6.4%  Glycemic control for   <7.0% adults with diabetes     CBG: Recent Labs  Lab 01/31/22 1600 01/31/22 2009  02/01/22 0038 02/01/22 0359 02/01/22 0715  GLUCAP 121* 144* 150* 114* 113*     Review of Systems:   Unable to assess pt mechanically ventilated  Allergies No Known Allergies   Scheduled Meds:  amiodarone  200 mg Per Tube BID   arformoterol  15 mcg Nebulization BID   budesonide (PULMICORT) nebulizer solution  0.25 mg Nebulization BID   chlorhexidine gluconate (MEDLINE KIT)  15 mL Mouth Rinse BID   Chlorhexidine Gluconate Cloth  6 each Topical Q0600   diltiazem  90 mg Per Tube Q6H   enoxaparin (LOVENOX) injection  1 mg/kg Subcutaneous BID   feeding supplement (PROSource TF)  45 mL Per Tube TID   free water  30 mL Per Tube Q4H   furosemide  40 mg Intravenous Q12H   insulin aspart  0-15 Units Subcutaneous Q4H   ipratropium-albuterol  3 mL Nebulization Q4H   lactulose  20 g Per Tube Daily   mouth rinse  15 mL Mouth Rinse 10 times per day   methylPREDNISolone (SOLU-MEDROL) injection  40 mg Intravenous Q12H   pantoprazole (PROTONIX) IV  40 mg Intravenous Q24H   polyethylene glycol  17 g Per Tube Daily   [START ON 02/02/2022] predniSONE  50 mg Per Tube Q breakfast   senna-docusate  1 tablet Per Tube BID   sodium chloride flush  10-40 mL Intracatheter Q12H   sodium chloride flush  10-40 mL Intracatheter Q12H   Continuous Infusions:  sodium chloride Stopped (01/25/22 1112)   ceFEPime (MAXIPIME) IV 2 g (02/01/22  0607)   feeding supplement (VITAL HIGH PROTEIN) 1,000 mL (02/01/22 1042)   fentaNYL infusion INTRAVENOUS Stopped (01/31/22 1434)   ketamine (KETALAR) Adult IV Infusion 3 mg/kg/hr (02/01/22 0713)   potassium PHOSPHATE IVPB (in mmol) 15 mmol (02/01/22 1041)   propofol (DIPRIVAN) infusion 40 mcg/kg/min (02/01/22 0736)   PRN Meds:.acetaminophen (TYLENOL) oral liquid 160 mg/5 mL, albuterol, docusate, fentaNYL (SUBLIMAZE) injection, fentaNYL (SUBLIMAZE) injection, metoprolol tartrate, midazolam, ondansetron (ZOFRAN) IV, polyethylene glycol, sodium chloride flush, sodium chloride flush, vecuronium   Critical care provider statement:   Total critical care time: 33 minutes   Performed by: Lanney Gins MD   Critical care time was exclusive of separately billable procedures and treating other patients.   Critical care was necessary to treat or prevent imminent or life-threatening deterioration.   Critical care was time spent personally by me on the following activities: development of treatment plan with patient and/or surrogate as well as nursing, discussions with consultants, evaluation of patient's response to treatment, examination of patient, obtaining history from patient or surrogate, ordering and performing treatments and interventions, ordering and review of laboratory studies, ordering and review of radiographic studies, pulse oximetry and re-evaluation of patient's condition.    Ottie Glazier, M.D.  Pulmonary & Critical Care Medicine

## 2022-02-01 NOTE — Progress Notes (Signed)
Palliative: ?Mr. Haberman is lying quietly in bed.  He is intubated/ventilated and sedated.  There is no family at bedside at this time.  Detail conversation with bedside nursing staff.  At that point it seems that Mr. Feister will need trach/PEG until further goals can be set up.  He is on day 12 of ventilator support. ? ?Call to significant other/girlfriend, [name] Hostetter.  No answer, voicemail full, unable to leave message. ? ?Conference with attending, bedside nursing staff, transition of care team related to patient condition, needs, goals of care, disposition. ?PMT is working closely with transition of care team with attempts to reach responsible party. ? ?Plan:   At this point continue to treat the treatable.  Full scope/full code by default.  Anticipate need for trach/PEG until guardianship is secured ? ?50 minutes  ?[name], NP ?Palliative medicine team ?Team phone [phone] ?Greater than 50% of this time was spent counseling and coordinating care related to the above assessment and plan. ?

## 2022-02-01 NOTE — Progress Notes (Addendum)
Busy day. Bath given at 0900. Wake up assessment also done with Ketamine and Propofol turned off for wake up. Patient only opened eyes but did not respond to pain. Discussed game plan for patient. Propofol restarted at 1000. Ketamine discontinued. Per Cardiology Amiodarone stopped at 1211 and po Cardizem and Pacerone given per OG . 1217 Order given to discontinue Propofol and start Precedex. Patient very tachypnea so patient given 2 mg of Versed IVP.  1234 B/P  increased greatly as well as heart rate. Given prn Metoprolol 5 mg with great success. 1537 After turned from side to side multiple times to clean stool patient given 2 mg more of versed to calm him. 1700 Order for [facility] placed due to massive bouts of diarrhea. Patient remains in A.Fib and on Precedex at max dose ?

## 2022-02-01 NOTE — Consult Note (Signed)
Kenneth Benson ?[id number] ?06-05-1961 ?Kenneth Rigger, MD ? ?Reason for Consult: Failure to extubate ? ?HPI: History noted in the chart patient in acute respiratory distress required intubation and is failed attempts at extubation.  ENT consulted for possible tracheostomy tube placed ? ?Allergies: No Known Allergies ? ?ROS: Review of systems normal other than 12 systems except per HPI. ? ?PMH:  ?Past Medical History:  ?Diagnosis Date  ? Asthma   ? Tobacco abuse   ? ? ?FH: History reviewed. No pertinent family history. ? ?SH:  ?Social History  ? ?Socioeconomic History  ? Marital status: Single  ?  Spouse name: Not on file  ? Number of children: Not on file  ? Years of education: Not on file  ? Highest education level: Not on file  ?Occupational History  ? Not on file  ?Tobacco Use  ? Smoking status: Every Day  ? Smokeless tobacco: Not on file  ?Substance and Sexual Activity  ? Alcohol use: No  ? Drug use: No  ? Sexual activity: Not on file  ?Other Topics Concern  ? Not on file  ?Social History Narrative  ? Not on file  ? ?Social Determinants of Health  ? ?Financial Resource Strain: Not on file  ?Food Insecurity: Not on file  ?Transportation Needs: Not on file  ?Physical Activity: Not on file  ?Stress: Not on file  ?Social Connections: Not on file  ?Intimate Partner Violence: Not on file  ? ? ?PSH: History reviewed. No pertinent surgical history. ? ?Physical  Exam: Patient intubated and sedated in the ICU anterior nose appears normal oral cavity oropharynx not examined due to endotracheal tube in position.  External ears appear clear.  Examination of the neck he has a long beard but does not appear to have any previous anterior neck scar. ? ?A/P: Failure to extubate requiring prolonged ventilation, I have read the notes including Dr. Terence Benson note from today.  His note says that due to acute fever and worsening respiratory status until that is stabilized we should hold off on the trach.  I will talk with our  scheduler and try to get this added on either later this week or first of next and will continue to follow his progress.  Assuming his respiratory status stabilizes and does not worsen we will proceed with tracheostomy placement as quickly as possible. ? ? ?ICU time approximately 30 minutes ? ?Kenneth Benson ?02/01/2022 ?5:57 PM ? ? ?  ?

## 2022-02-01 NOTE — Progress Notes (Addendum)
[facility] Cardiology ? ?Patient ID: ?Kenneth Benson ?MRN: [id number] ?DOB/AGE: 1961-04-06 61 y.o. ?  ?Admit date: 01/21/2022 ?Referring Physician Dr. Donna Bernard ?Primary Physician  ?Primary Cardiologist  ?Reason for Consultation EKG changes ?  ?HPI: The patient is a 61 year old male with a past medical history notable for asthma, COPD, and current tobacco use who presented to [facility] ED in the early morning hours complaining of shortness of breath with worsening cough and congestion.  Per EMS reporting the patient's O2 sats were in the 60s and he was given 2 DuoNebs and Solu-Medrol on route.  He presented to the ED in moderate respiratory distress with diffuse wheezing and had chest tightness without chest pain.  He was given DuoNebs x3 and 2 g of IV mag and was subsequently placed on BiPAP and eventually required intubation due to worsening respiratory status.  Cardiology is consulted because his EKG changes and ventricular bigeminy on admission with periods of breakthrough new AF with RVR.  ? ?Interval History: ?-febrile overnight, in AF at 100-120 this morning ?-remains intubated and sedated without family at bedside. ? ? ?Vitals:  ? 02/01/22 0000 02/01/22 0029 02/01/22 0043 02/01/22 0400  ?BP: 136/89  (!) 145/66 120/67  ?Pulse: (!) 134   (!) 117  ?Resp: (!) 21   20  ?Temp: (!) 101.4 ?F (38.6 ?C)   (!) 101.2 ?F (38.4 ?C)  ?TempSrc: Bladder   Bladder  ?SpO2: 93% 94%  92%  ?Weight:      ?Height:      ? ? ? ?Intake/Output Summary (Last 24 hours) at 02/01/2022 1051 ?Last data filed at 02/01/2022 1044 ?Gross per 24 hour  ?Intake 3815.37 ml  ?Output 3250 ml  ?Net 565.37 ml  ? ? ?PHYSICAL EXAM ?General: Acutely ill-appearing middle-aged Caucasian male, intubated in the ICU  ?HEENT:  Normocephalic and atraumatic. ?Neck:   No JVD.  ?Lungs: Intubated. Air movement somewhat improving, coarse breath sounds and without wheezes  ?Heart: irregularly irregular rate and rhythm. Normal S1 and S2 without gallops or murmurs. Radial & DP pulses 2+  bilaterally. ?Abdomen: Soft, obese ?Msk: Normal tone for age. ?Extremities: Warm and well perfused. No clubbing, cyanosis.  Trace upper and lower extremity edema bilaterally. ?Neuro: Sedated. ?Psych: Unable to assess due to sedation ? ? ?LABS: ?Basic Metabolic Panel: ?Recent Labs  ?  01/31/22 ?0500 02/01/22 ?0500  ?NA 142 142  ?K 3.7 3.7  ?CL 96* 95*  ?CO2 38* 37*  ?GLUCOSE 202* 187*  ?BUN 48* 52*  ?CREATININE 0.88 0.85  ?CALCIUM 7.8* 7.7*  ?MG 2.3 2.4  ?PHOS 2.4* 2.4*  ? ?Liver Function Tests: ?No results for input(s): AST, ALT, ALKPHOS, BILITOT, PROT, ALBUMIN in the last 72 hours. ? ?No results for input(s): LIPASE, AMYLASE in the last 72 hours. ?CBC: ?Recent Labs  ?  01/30/22 ?5093 01/31/22 ?0500 02/01/22 ?0500  ?WBC 8.7 8.6 11.3*  ?NEUTROABS 7.5  --   --   ?HGB 12.5* 12.0* 13.2  ?HCT 40.6 38.1* 41.3  ?MCV 96.9 98.2 96.7  ?PLT 150 153 202  ? ?Cardiac Enzymes: ?No results for input(s): CKTOTAL, CKMB, CKMBINDEX, TROPONINI in the last 72 hours. ?BNP: ?Invalid input(s): POCBNP ?D-Dimer: ?No results for input(s): DDIMER in the last 72 hours. ?Hemoglobin A1C: ?No results for input(s): HGBA1C in the last 72 hours. ?Fasting Lipid Panel: ?Recent Labs  ?  01/31/22 ?0800  ?TRIG 90  ? ?Thyroid Function Tests: ?Recent Labs  ?  01/30/22 ?2671  ?TSH 0.417  ? ?Anemia Panel: ?No results for  input(s): VITAMINB12, FOLATE, FERRITIN, TIBC, IRON, RETICCTPCT in the last 72 hours. ? ?No results found. ? ? ?Echo LVEF 60-65% ? ?TELEMETRY: Atrial fibrillation 120-130s overnight, improved to 100-120bpm this morning ? ?ASSESSMENT AND PLAN: ? ?Principal Problem: ?  Asthma exacerbation ?Active Problems: ?  COPD exacerbation (HCC) ?  Sepsis (HCC) ?  Tobacco abuse ?  Acute respiratory failure with hypoxia and hypercapnia (HCC) ?  Acute on chronic respiratory failure with hypercapnia (HCC) ?  Arterial line in place ?  ? ?1. Atrial fibrillation with RVR, compensatory for underlying respiratory failure, previously controlled on Cardizem drip, on  Lovenox for stroke prevention ?2.  Frequent PVCs of uncertain clinical significance, normal left ventricular function ?3.  Respiratory failure / asthma / COPD exacerbation / severe ARDS / pansinusitis intubated and on IV abx  ? ?  ?Recommendations ?  ?1.  Agree with current therapy per PCCM ?2.  received roughly 9g of amiodarone IV since admission, will transition to 200mg  BID per tube BID and increase diltiazem per tube from 60mg  q6h to 90mg  q6h. BP per A line 120 systolic ?3.  Continue Lovenox for stroke prevention ?4.  Agree with lasix 40mg  BID for volume overload    ?5.  Defer further cardiac diagnostics at this time ? ?This patient's plan of care was discussed and created with Dr. and he is in agreement.   ? ? , PA-C ?02/01/2022 ?10:51 AM ? ? ? ?  ?

## 2022-02-02 LAB — GLUCOSE, CAPILLARY
Glucose-Capillary: 108 mg/dL — ABNORMAL HIGH (ref 70–99)
Glucose-Capillary: 118 mg/dL — ABNORMAL HIGH (ref 70–99)
Glucose-Capillary: 131 mg/dL — ABNORMAL HIGH (ref 70–99)
Glucose-Capillary: 142 mg/dL — ABNORMAL HIGH (ref 70–99)
Glucose-Capillary: 168 mg/dL — ABNORMAL HIGH (ref 70–99)
Glucose-Capillary: 96 mg/dL (ref 70–99)

## 2022-02-02 LAB — PHOSPHORUS: Phosphorus: 3.3 mg/dL (ref 2.5–4.6)

## 2022-02-02 LAB — BASIC METABOLIC PANEL
Anion gap: 8 (ref 5–15)
BUN: 46 mg/dL — ABNORMAL HIGH (ref 6–20)
CO2: 37 mmol/L — ABNORMAL HIGH (ref 22–32)
Calcium: 7.8 mg/dL — ABNORMAL LOW (ref 8.9–10.3)
Chloride: 103 mmol/L (ref 98–111)
Creatinine, Ser: 0.58 mg/dL — ABNORMAL LOW (ref 0.61–1.24)
GFR, Estimated: >60 mL/min (ref >60)
Glucose, Bld: 124 mg/dL — ABNORMAL HIGH (ref 70–99)
Potassium: 4 mmol/L (ref 3.5–5.1)
Sodium: 148 mmol/L — ABNORMAL HIGH (ref 135–145)

## 2022-02-02 LAB — CBC
HCT: 41.6 % (ref 39.0–52.0)
Hemoglobin: 13.2 g/dL (ref 13.0–17.0)
MCH: 30.6 pg (ref 26.0–34.0)
MCHC: 31.7 g/dL (ref 30.0–36.0)
MCV: 96.3 fL (ref 80.0–100.0)
Platelets: 164 10*3/uL (ref 150–400)
RBC: 4.32 MIL/uL (ref 4.22–5.81)
RDW: 14.3 % (ref 11.5–15.5)
WBC: 8 10*3/uL (ref 4.0–10.5)
nRBC: 0 % (ref 0.0–0.2)

## 2022-02-02 LAB — MAGNESIUM: Magnesium: 2.2 mg/dL (ref 1.7–2.4)

## 2022-02-02 LAB — IGE: IgE (Immunoglobulin E), Serum: 1436 IU/mL — ABNORMAL HIGH (ref 6–495)

## 2022-02-02 MED ORDER — VITAL AF 1.2 CAL PO LIQD
1000.0000 mL | ORAL | Status: DC
  Administered 2022-02-02: 1000 mL

## 2022-02-02 MED ORDER — PROSOURCE TF PO LIQD
45.0000 mL | Freq: Four times a day (QID) | ORAL | Status: DC
  Administered 2022-02-02 – 2022-02-03 (×4): 45 mL
  Filled 2022-02-02: qty 45

## 2022-02-02 MED ORDER — FREE WATER
50.0000 mL | Status: DC
  Administered 2022-02-02 – 2022-02-05 (×17): 50 mL

## 2022-02-02 MED ORDER — ACETAMINOPHEN 10 MG/ML IV SOLN
1000.0000 mg | Freq: Four times a day (QID) | INTRAVENOUS | Status: AC
  Administered 2022-02-02 – 2022-02-03 (×4): 1000 mg via INTRAVENOUS
  Filled 2022-02-02 (×4): qty 100

## 2022-02-02 NOTE — Progress Notes (Signed)
[facility] Cardiology ? ?Patient ID: ?Kenneth Benson ?MRN: [id number] ?DOB/AGE: 1961-01-09 61 y.o. ?  ?Admit date: 01/21/2022 ?Referring Physician Dr. Donna Bernard ?Primary Physician  ?Primary Cardiologist  ?Reason for Consultation EKG changes ?  ?HPI: The patient is a 61 year old male with a past medical history notable for asthma, COPD, and current tobacco use who presented to [facility] ED in the early morning hours complaining of shortness of breath with worsening cough and congestion.  Per EMS reporting the patient's O2 sats were in the 60s and he was given 2 DuoNebs and Solu-Medrol on route.  He presented to the ED in moderate respiratory distress with diffuse wheezing and had chest tightness without chest pain.  He was given DuoNebs x3 and 2 g of IV mag and was subsequently placed on BiPAP and eventually required intubation due to worsening respiratory status.  Cardiology is consulted because his EKG changes and ventricular bigeminy on admission with periods of breakthrough new AF with RVR.  ? ?Interval History: ?-fever broke this morning, HR better controlled after increase of diltiazem between 104-120 ?-remains intubated and sedated without family at bedside. ? ? ?Vitals:  ? 02/02/22 0600 02/02/22 0748 02/02/22 0752 02/02/22 0800  ?BP: 106/66     ?Pulse: (!) 35     ?Resp: (!) 23     ?Temp: 99.9 ?F (37.7 ?C)   (!) 96.4 ?F (35.8 ?C)  ?TempSrc: Bladder   Rectal  ?SpO2: 97% 98% 99%   ?Weight:      ?Height:      ? ? ? ?Intake/Output Summary (Last 24 hours) at 02/02/2022 1106 ?Last data filed at 02/02/2022 1000 ?Gross per 24 hour  ?Intake 9049.65 ml  ?Output 5725 ml  ?Net 3324.65 ml  ? ? ? ?PHYSICAL EXAM ?General: Acutely ill-appearing middle-aged Caucasian male, intubated in the ICU  ?HEENT:  Normocephalic and atraumatic. ?Neck:   No JVD.  ?Lungs: Intubated. Air movement somewhat improving, coarse breath sounds and without wheezes  ?Heart: irregularly irregular rate and rhythm. Normal S1 and S2 without gallops or murmurs. Radial &  DP pulses 2+ bilaterally. ?Abdomen: Soft, obese ?Msk: Normal tone for age. ?Extremities: Warm and well perfused. No clubbing, cyanosis.  Trace upper and lower extremity edema bilaterally. ?Neuro: Sedated. ?Psych: Unable to assess due to sedation ? ? ?LABS: ?Basic Metabolic Panel: ?Recent Labs  ?  02/01/22 ?0500 02/02/22 ?0636  ?NA 142 148*  ?K 3.7 4.0  ?CL 95* 103  ?CO2 37* 37*  ?GLUCOSE 187* 124*  ?BUN 52* 46*  ?CREATININE 0.85 0.58*  ?CALCIUM 7.7* 7.8*  ?MG 2.4 2.2  ?PHOS 2.4* 3.3  ? ? ?Liver Function Tests: ?No results for input(s): AST, ALT, ALKPHOS, BILITOT, PROT, ALBUMIN in the last 72 hours. ? ?No results for input(s): LIPASE, AMYLASE in the last 72 hours. ?CBC: ?Recent Labs  ?  02/01/22 ?0500 02/02/22 ?0636  ?WBC 11.3* 8.0  ?HGB 13.2 13.2  ?HCT 41.3 41.6  ?MCV 96.7 96.3  ?PLT 202 164  ? ? ?Cardiac Enzymes: ?No results for input(s): CKTOTAL, CKMB, CKMBINDEX, TROPONINI in the last 72 hours. ?BNP: ?Invalid input(s): POCBNP ?D-Dimer: ?No results for input(s): DDIMER in the last 72 hours. ?Hemoglobin A1C: ?No results for input(s): HGBA1C in the last 72 hours. ?Fasting Lipid Panel: ?Recent Labs  ?  01/31/22 ?0800  ?TRIG 90  ? ? ?Thyroid Function Tests: ?No results for input(s): TSH, T4TOTAL, T3FREE, THYROIDAB in the last 72 hours. ? ?Invalid input(s): FREET3 ? ?Anemia Panel: ?No results for input(s): VITAMINB12, FOLATE, FERRITIN,  TIBC, IRON, RETICCTPCT in the last 72 hours. ? ?No results found. ? ? ?Echo LVEF 60-65% ? ?TELEMETRY: Atrial fibrillation 100-120bpm with PVCs ? ?ASSESSMENT AND PLAN: ? ?Principal Problem: ?  Asthma exacerbation ?Active Problems: ?  COPD exacerbation (HCC) ?  Sepsis (HCC) ?  Tobacco abuse ?  Acute respiratory failure with hypoxia and hypercapnia (HCC) ?  Acute on chronic respiratory failure with hypercapnia (HCC) ?  Arterial line in place ?  ? ?1. Atrial fibrillation with RVR, compensatory for underlying respiratory failure, previously controlled on Cardizem drip, on Lovenox for stroke  prevention ?2.  Frequent PVCs of uncertain clinical significance, normal left ventricular function ?3.  Respiratory failure / asthma / COPD exacerbation / severe ARDS / pansinusitis intubated and on IV abx  ? ?  ?Recommendations ?  ?1.  Agree with current therapy per PCCM ?2.  received roughly 9g of amiodarone IV since admission, continue 200mg  BID per tube BID and increase diltiazem per tube from 60mg  q6h to 90mg  q6h. BP per A line 130 systolic ?3.  Continue Lovenox for stroke prevention ?4.  Agree with lasix 40mg  BID for volume overload    ?5.  Defer further cardiac diagnostics at this time ? ?This patient's plan of care was discussed and created with Dr. and he is in agreement.   ? ? , PA-C ?02/02/2022 ?11:06 AM ? ? ? ?  ?

## 2022-02-02 NOTE — Progress Notes (Signed)
NAME:  Kenneth Benson, MRN:  [id number], DOB:  [birth year], LOS: 12 ADMISSION DATE:  01/21/2022, CONSULTATION DATE: 01/21/2022 REFERRING MD: Dr. Cheri Fowler, CHIEF COMPLAINT: Shortness of Breath    History of Present Illness:  This is a 61 y.o. male who presented to [facility] ER via EMS on 02/24 with c/o shortness of breath, cough, wheezing, and congestion onset of symptoms several days prior to presentation. He reported using his inhaler without relief of symptoms.  EMS reported pt severely hypoxic with O2 sats in the 60's.  He received duoneb's x2 and 125 mg of iv solumedrol en route to the ER.  All hx obtained from chart review pt currently sedated and mechanically intubated with no family at bedside.   ED Course Upon arrival to the ER pt noted to be in moderate respiratory distress with diffuse wheezes per ED provider notes.  CXR, COVID-19/Influenza A&B, and pct negative.  VBG revealed pH 7.17/pCO2 99/bicarb 36.1.  EKG revealed sinus tachycardia hr 140 without ST elevation/depression and troponin 9.  He received duoneb x3 and 2 g of iv magnesium.  He was subsequently placed on Bipap with initial improvement of respiratory status.  However, respiratory status rapidly declined and pt required mechanical intubation.  He received an additional 2 g of iv magnesium and duoneb post intubation.  PCCM team contacted for ICU admission.  02/01/22- patient remains tachyarrythymic s/p amiodarone IV without response, reviewed with cardiology team.  Will switch sedation to precedex. Today. I spoke with GF [name] today and she was able to provide to [place] patients Sister contact information which has been uploaded to her chart. 02/02/22- patient improved with reduced FIO2 from 55 to 40. ENT consult for possible trache.Sister of patietn was finally located and she will come in to see patient tonight.   Pertinent  Medical History  COPD Current Smoker  Asthma   Significant Hospital Events: Including procedures, antibiotic start  and stop dates in addition to other pertinent events   02/24: Pt admitted to ICU with acute on chronic hypercapnic hypoxic respiratory failure secondary to asthma and COPD exacerbation requiring mechanical intubation  02/25: Remains intubated, vent changes allowed 02/27: Remains intubated, slowly weaning vent support. Respiratory culture with normal respiratory flora 03/01: Cardizem and Heparin gtts initiated by Cardiology for AF. Unable to tolerate decrease in sedation due to Tachycardia and hypoxia. 03/02: Sedation lightened for WUA, delirious but able to follow simple commands.  CXR concerning for possible pulmonary edema, diuresing with Diamox + Metolazone.  Heparin gtt changed to treatment dose Lovenox 03/4: Pt severely hypoxic overnight with elevated peak pressures requiring following ventilator changes: FiO2 90% and PEEP 10. ETT exchanged.  Amiodarone bolus followed by drip started due to atrial fibrillation with rvr 3/5: continued difficulty achieving rate control; remains on high vent settings 01/31/22- reduced sedation with dc of fentanyl and minimal propofol with initiation of ketamine due to status asthmaticus in context of mixed viral and bacterial LRTI    Objective   Blood pressure 106/66, pulse (!) 35, temperature (!) 96.4 F (35.8 C), temperature source Rectal, resp. rate (!) 23, height 6' 0.01" (1.829 m), weight 103 kg, SpO2 99 %.    Vent Mode: PRVC FiO2 (%):  [40 %-55 %] 40 % Set Rate:  [18 bmp] 18 bmp Vt Set:  [470 mL] 470 mL PEEP:  [8 cmH20] 8 cmH20   During rounds was able to switch patient to PRVC: Vent Mode: PRVC FiO2 (%):  [40 %-55 %] 40 % Set Rate:  [  18 bmp] 18 bmp Vt Set:  [470 mL] 470 mL PEEP:  [8 cmH20] 8 cmH20   Intake/Output Summary (Last 24 hours) at 02/02/2022 1055 Last data filed at 02/02/2022 1000 Gross per 24 hour  Intake 9049.65 ml  Output 5725 ml  Net 3324.65 ml    Filed Weights   01/27/22 0500 01/28/22 0456 01/31/22 0500  Weight: 100.4 kg 102.2  kg 103 kg   Examination: General: Acutely ill appearing male, sedated and mechanically ventilated, in NAD Lungs: rhonchi with inspiratory and expiratory wheezing  Cardiovascular: irregularly irregular, no M/R/G, 2+ radial/2+ distal pulses, 1+ pitting edema of BLE Abdomen: +BS x4, obese, soft, non tender, non distended  Neuro: Heavily sedated RASS -4 to 5 GU: indwelling foley draining clear yellow urine   Resolved Hospital Problem list   Acute kidney injury likely due to ATN Mild hyponatremia, hypochloremia  Assessment & Plan:   Acute on chronic hypoxic hypercapnic respiratory failure Status asthmaticus Rhinovirus bronchiolitis Concern for possible VAP~respiratory culture 03/3 moderate gram+ cocci in pair and moderate gram negative rods -Full vent support, implement lung protective strategies -Plateau pressures less than 30 cm H20 -Wean FiO2 & PEEP as tolerated to maintain O2 sats >88% -Follow intermittent Chest X-ray & ABG as needed -Spontaneous Breathing Trials when respiratory parameters met and mental status permits -Implement VAP Bundle -Prn vecuronium for vent dyssynchrony  -CT Chest today -Continue frequent Duo-Nebs, Brovana, Pulmicort nebs -Continue methylpred 60 mg IV q8h  -Sent IgE, Strogyloides Ab yesterday given persistent eos on differential -Continue cefepime and vancomycin pending respiratory culture results  -Pt will likely need tracheostomy, however fever and worsened respiratory status precludes trach at this time\ Metapneumovirus NOT DETECTED NOT DETECTED   Rhinovirus / Enterovirus NOT DETECTED DETECTED Abnormal    Influenza A NOT DETECTED NOT DETECTED    0 Result Notes    Component 3 d ago  Specimen Description TRACHEAL ASPIRATE  Performed at [facility], [address] [place], [place] [address]   Special Requests NONE  Performed at [facility], [address] [place], [place] [address]   Gram Stain FEW WBC PRESENT, PREDOMINANTLY  MONONUCLEAR  MODERATE GRAM NEGATIVE RODS  FEW GRAM POSITIVE COCCI IN PAIRS   Culture ABUNDANT HAEMOPHILUS INFLUENZAE  BETA LACTAMASE NEGATIVE  Performed at [facility], [place] [address], [place], [place] [address]   Report Status 01/31/2022 FINAL      Cardiogenic pulmonary edema Recurrent atrial fibrillation with rvr Intermittent ventricular bigeminy with frequent PVC's  TTE 01/21/22:  Left ventricular ejection fraction, by estimation, is 60  to 65%. The left ventricle has normal function. The left ventricle has no  regional wall motion abnormalities. The left ventricular internal cavity  size was mildly dilated. There is  borderline concentric left ventricular hypertrophy. The interventricular  septum is flattened in diastole ('D' shaped left ventricle), consistent with right ventricular volume overload. Left ventricular diastolic parameters were normal.  -Continuous telemetry monitoring  -Continue amiodarone gtt; re-bolus today -Start scheduled Cardizem PO -Awaiting formal read of bubble study -Diurese for goal 1L negative today -Replace electrolytes as indicated  -Monitor UOP  -Continue therapeutic lovenox -Cardiology consulted appreciate input   Constipation -Continue current bowel regimen   Sedation needs in the setting of mechanical ventilation -Maintain a RASS goal of -4 to -5 due to worsening hypoxia  -Fentanyl and Propofol gtts along with prn vecuronium as needed to maintain RASS goal  Hyperglycemia, likely due to critical illness and steroids Hgb A1c 4.9 -CBG's  q4h; Target range of 140 to 180 -SSI -Follow ICU Hypo/Hyperglycemia protocol  Best Practice (right click and "Reselect all SmartList Selections" daily)   Diet/type: NPO, tube feeds DVT prophylaxis: Therapeutic Lovenox  GI prophylaxis: PPI Lines: Central line and Arterial Line; will d/c CVC today and place PICC Foley:  Yes, and it is still needed Code Status:  full code Last date of multidisciplinary  goals of care discussion [01/27/2022]  Significant other [name] Hostetter is only current contact. Need further clarification on status of additional family members to determine decision-maker. Labs   CBC: Recent Labs  Lab 01/28/22 0446 01/29/22 0420 01/30/22 0531 01/31/22 0500 02/01/22 0500 02/02/22 0636  WBC 8.9   9.2 9.6   9.7 8.7 8.6 11.3* 8.0  NEUTROABS 7.1 6.7 7.5  --   --   --   HGB 12.6*   12.5* 13.8   13.7 12.5* 12.0* 13.2 13.2  HCT 41.1   40.7 44.8   44.1 40.6 38.1* 41.3 41.6  MCV 99.8   98.8 98.0   97.1 96.9 98.2 96.7 96.3  PLT 176   166 198   182 150 153 202 164     Basic Metabolic Panel: Recent Labs  Lab 01/29/22 0420 01/30/22 0531 01/31/22 0500 02/01/22 0500 02/02/22 0636  NA 144 143 142 142 148*  K 3.6 4.2 3.7 3.7 4.0  CL 98 99 96* 95* 103  CO2 39* 37* 38* 37* 37*  GLUCOSE 120* 151* 202* 187* 124*  BUN 46* 46* 48* 52* 46*  CREATININE 0.85 0.78 0.88 0.85 0.58*  CALCIUM 8.4* 8.2* 7.8* 7.7* 7.8*  MG 2.1 2.7* 2.3 2.4 2.2  PHOS 4.0 3.3 2.4* 2.4* 3.3    GFR: Estimated Creatinine Clearance: 123.5 mL/min (A) (by C-G formula based on SCr of 0.58 mg/dL (L)). Recent Labs  Lab 01/29/22 0420 01/30/22 0531 01/31/22 0500 02/01/22 0500 02/02/22 0636  PROCALCITON <0.10  --   --   --   --   WBC 9.6   9.7 8.7 8.6 11.3* 8.0     Liver Function Tests: Recent Labs  Lab 01/29/22 0420  AST 13*  ALT 20  ALKPHOS 40  BILITOT 0.7  PROT 7.1  ALBUMIN 2.8*    No results for input(s): LIPASE, AMYLASE in the last 168 hours. No results for input(s): AMMONIA in the last 168 hours.  ABG    Component Value Date/Time   PHART 7.49 (H) 01/30/2022 2034   PCO2ART 50 (H) 01/30/2022 2034   PO2ART 66 (L) 01/30/2022 2034   HCO3 38.1 (H) 01/30/2022 2034   O2SAT 96.8 01/30/2022 2034      Coagulation Profile: No results for input(s): INR, PROTIME in the last 168 hours.   Cardiac Enzymes: No results for input(s): CKTOTAL, CKMB, CKMBINDEX, TROPONINI in the last 168  hours.  HbA1C: Hgb A1c MFr Bld  Date/Time Value Ref Range Status  01/21/2022 02:36 PM 4.9 4.8 - 5.6 % Final    Comment:    (NOTE) Pre diabetes:          5.7%-6.4%  Diabetes:              >6.4%  Glycemic control for   <7.0% adults with diabetes     CBG: Recent Labs  Lab 02/01/22 1637 02/01/22 1931 02/01/22 2334 02/02/22 0500 02/02/22 0916  GLUCAP 117* 148* 167* 118* 142*     Review of Systems:   Unable to assess pt mechanically ventilated  Allergies No Known Allergies   Scheduled Meds:  amiodarone  200 mg Per Tube BID   arformoterol  15 mcg Nebulization BID   budesonide (PULMICORT) nebulizer solution  0.25 mg Nebulization BID   chlorhexidine gluconate (MEDLINE KIT)  15 mL Mouth Rinse BID   Chlorhexidine Gluconate Cloth  6 each Topical Q0600   diltiazem  90 mg Per Tube Q6H   enoxaparin (LOVENOX) injection  1 mg/kg Subcutaneous BID   feeding supplement (PROSource TF)  45 mL Per Tube TID   free water  30 mL Per Tube Q4H   furosemide  40 mg Intravenous Q12H   insulin aspart  0-15 Units Subcutaneous Q4H   ipratropium-albuterol  3 mL Nebulization Q4H   lactulose  20 g Per Tube Daily   mouth rinse  15 mL Mouth Rinse 10 times per day   pantoprazole (PROTONIX) IV  40 mg Intravenous Q24H   polyethylene glycol  17 g Per Tube Daily   predniSONE  50 mg Per Tube Q breakfast   senna-docusate  1 tablet Per Tube BID   sodium chloride flush  10-40 mL Intracatheter Q12H   sodium chloride flush  10-40 mL Intracatheter Q12H   Continuous Infusions:  sodium chloride Stopped (01/25/22 1112)   acetaminophen 1,000 mg (02/02/22 0619)   dexmedetomidine (PRECEDEX) IV infusion 1.2 mcg/kg/hr (02/02/22 0445)   feeding supplement (VITAL HIGH PROTEIN) 1,000 mL (02/01/22 1042)   fentaNYL infusion INTRAVENOUS Stopped (01/31/22 1434)   ketamine (KETALAR) Adult IV Infusion Stopped (02/01/22 1000)   propofol (DIPRIVAN) infusion 35 mcg/kg/min (02/02/22 0608)   PRN Meds:.acetaminophen  (TYLENOL) oral liquid 160 mg/5 mL, albuterol, docusate, fentaNYL (SUBLIMAZE) injection, fentaNYL (SUBLIMAZE) injection, metoprolol tartrate, midazolam, ondansetron (ZOFRAN) IV, polyethylene glycol, sodium chloride flush, sodium chloride flush, vecuronium   Critical care provider statement:   Total critical care time: 33 minutes   Performed by: Lanney Gins MD   Critical care time was exclusive of separately billable procedures and treating other patients.   Critical care was necessary to treat or prevent imminent or life-threatening deterioration.   Critical care was time spent personally by me on the following activities: development of treatment plan with patient and/or surrogate as well as nursing, discussions with consultants, evaluation of patient's response to treatment, examination of patient, obtaining history from patient or surrogate, ordering and performing treatments and interventions, ordering and review of laboratory studies, ordering and review of radiographic studies, pulse oximetry and re-evaluation of patient's condition.    Ottie Glazier, M.D.  Pulmonary & Critical Care Medicine

## 2022-02-02 NOTE — Progress Notes (Signed)
Palliative: ?Kenneth Benson is lying quietly in bed.  He is intubated/ventilated and sedated.  He has developed fevers again overnight.  Detailed conference with bedside nursing staff. ? ?Call to sister, Kenneth Benson.  No answer, unable to leave voicemail message X2    ?Almost immediate return phone call from Kenneth Benson.  She shares that Kenneth Benson goes by [facility].  She shares that they share a father, but were not raised together.  Kenneth Benson tells me that Kenneth Benson's girlfriend Kenneth Benson is Kenneth Benson's half-sister, they share a mother.  She tells me that Kenneth Benson and Kenneth Benson have been together for about 15 years.  Kenneth Benson's parents are deceased.  He has no other siblings.  He has 3 children from his first wife but they all live in [place]; 1 son from his second wife that he has not seen in 20 years; 2 children from his third wife, 1 died several years ago but he has not spoken to the other in many years.  Kenneth Benson states that she is able and willing to be Kenneth Benson's surrogate decision maker. ? ?We talked about Kenneth Benson's acute and chronic health concerns including, but not limited to respiratory failure, fever, need for trach and long-term acute care/vent facility.  Kenneth Benson shares that at this point she feels that Kenneth Benson would want to continue trying for improvements/he would want trach.  We do talk about CODE STATUS and the concept of "treat the treatable, but allowing natural passing".  Kenneth Benson states that she will come to see Kenneth Benson tonight.   ? ?Kenneth Benson shares several times that she has been unable to reach Kenneth Benson and did not know where Kenneth Benson was hospitalized.   ? ?Conference with attending, bedside nursing staff, transition of care team related to patient condition, needs, goals of care, disposition. ? ?Plan:   At this point continue full scope/full code, responsible party/Sister Kenneth Benson is agreeable for trach. ? ?50 minutes  ?Kenneth Carmel, NP ?Palliative medicine team ?Team phone [phone] ?Greater than 50% of this time was spent counseling and  coordinating care related to the above assessment and plan. ?

## 2022-02-02 NOTE — Progress Notes (Signed)
Nutrition Follow Up Note  ? ?DOCUMENTATION CODES:  ? ?Obesity unspecified ? ?INTERVENTION:  ? ?Change to Vital 1.2'@65ml' /hr + ProSource TF 3m QID via tube  ? ?Propofol: 21.4 ml/hr- provides 564kcal/day  ? ?Free water flushes 532mq4 hours ? ?Regimen provides 2556kcal/day, 161g/day protein and 156563may of free water  ? ?NUTRITION DIAGNOSIS:  ? ?Inadequate oral intake related to inability to eat (pt sedated and ventilated) as evidenced by NPO status. ? ?GOAL:  ? ?Provide needs based on ASPEN/SCCM guidelines ?-met with tube feeds  ? ?MONITOR:  ? ?Vent status, Labs, Weight trends, Skin, I & O's ? ?ASSESSMENT:  ? ?[age]o male with h/o asthma, COPD and currently smoker who is admitted with severe COPD exacerbation. ? ?Pt remains sedated and ventilated. OGT in place. Pt tolerating tube feeds well at goal rate. Rectal tube in place as pt with large amounts of watery stool; will discontinue lactulose today and plan to add soluble fiber if diarrhea does not improve. Per chart, pt appears weight stable. Pt +3.6L on his I & Os. Palliative care following for GOC. Pt may require trach/PEG tube.  ? ?Medications reviewed and include: lovenox, lasix, insulin, lactulose, solu-medrol, protonix, miralax, senokot, cefepime, propofol  ? ?Labs reviewed: K 3.7 wnl, BUN 48(H), P 2.4(L), Mg 2.3 wnl ?Cbgs- 141, 124, 125 x 24 hrs ? ?Patient is currently intubated on ventilator support ?MV: 12.5 L/min ?Temp (24hrs), Avg:100 ?F (37.8 ?C), Min:96.4 ?F (35.8 ?C), Max:102.6 ?F (39.2 ?C) ? ?Propofol: 21.4 ml/hr- provides 564kcal/day  ? ?MAP- >36m47m ? ?UOP- 5400ml59m?Diet Order:   ? ?Diet Order   ? ?       ?  Diet NPO time specified  Diet effective now       ?  ? ?  ?  ? ?  ? ?EDUCATION NEEDS:  ? ?No education needs have been identified at this time ? ?Skin:  Skin Assessment: Reviewed RN Assessment ? ?Last BM:  3/8- 800ml 55mrectal tube ? ?Height:  ? ?Ht Readings from Last 1 Encounters:  ?01/28/22 6' 0.01" (1.829 m)  ? ? ?Weight:  ? ?Wt  Readings from Last 1 Encounters:  ?01/31/22 103 kg  ? ? ?Ideal Body Weight:  80.9 kg ? ?BMI:  Body mass index is 30.79 kg/m?. ? ?Estimated Nutritional Needs:  ? ?Kcal:  2533kcal/day ? ?Protein:  >160g/day ? ?Fluid:  2.4-2.7L/day ? ?Denishia Citro Koleen DistanceD, LDN ?Please refer to [facility] and/or RD on-call/weekend/after hours pager ? ? ?

## 2022-02-02 NOTE — Consult Note (Signed)
PHARMACY CONSULT NOTE ? ?Pharmacy Consult for Electrolyte Monitoring and Replacement  ? ?Recent Labs: ?Potassium (mmol/L)  ?Date Value  ?02/02/2022 4.0  ? ?Magnesium (mg/dL)  ?Date Value  ?02/02/2022 2.2  ? ?Calcium (mg/dL)  ?Date Value  ?02/02/2022 7.8 (L)  ? ?Albumin (g/dL)  ?Date Value  ?01/29/2022 2.8 (L)  ? ?Phosphorus (mg/dL)  ?Date Value  ?02/02/2022 3.3  ? ?Sodium (mmol/L)  ?Date Value  ?02/02/2022 148 (H)  ? ?Corrected Ca: 8.76 mg/dL ? ?Assessment: ?Patient is a 61 y/o M with medical history including COPD / asthma and tobacco abuse who presented to the ED 2/24 with acute respiratory failure secondary to asthma / COPD exacerbation. Patient ultimately required intubation. Disposition is the ICU. Pharmacy consulted to assist with electrolyte monitoring and replacement as indicated. ? ?Nutrition:  ?Vital HP at 60>12ml/hr + ProSource TF 29ml TID via tube  ?Free water flushes 30>41mL q4 hours ? ?Diuretics: ?+ lasix 40mg  IV BID (3/6-3/10) [I/O: +3L; UOP 1.7>2.67ml/k/h] ? ?Goal of Therapy:  ?Potassium 4.0 - 5.1 mmol/L ?Magnesium 2.0 - 2.4 mg/dL ?All Other Electrolytes within normal limits ? ?Plan:  ?Scr 0.85>0.58 ?K 3.7>4: no repletion at this time ?Phos: 2.4>3.3: no repletion at this time. ?Follow-up electrolytes with AM labs  ? ?Lorna Dibble, PharmD, BCCP ?Clinical Pharmacist ?02/02/2022 4:25 PM ? ? ? ? ?

## 2022-02-03 LAB — CBC
HCT: 42.6 % (ref 39.0–52.0)
Hemoglobin: 14 g/dL (ref 13.0–17.0)
MCH: 32.3 pg (ref 26.0–34.0)
MCHC: 32.9 g/dL (ref 30.0–36.0)
MCV: 98.2 fL (ref 80.0–100.0)
Platelets: 170 10*3/uL (ref 150–400)
RBC: 4.34 MIL/uL (ref 4.22–5.81)
RDW: 14.4 % (ref 11.5–15.5)
WBC: 9.7 10*3/uL (ref 4.0–10.5)
nRBC: 0 % (ref 0.0–0.2)

## 2022-02-03 LAB — BASIC METABOLIC PANEL
Anion gap: 6 (ref 5–15)
BUN: 42 mg/dL — ABNORMAL HIGH (ref 6–20)
CO2: 37 mmol/L — ABNORMAL HIGH (ref 22–32)
Calcium: 7.9 mg/dL — ABNORMAL LOW (ref 8.9–10.3)
Chloride: 94 mmol/L — ABNORMAL LOW (ref 98–111)
Creatinine, Ser: 0.61 mg/dL (ref 0.61–1.24)
GFR, Estimated: >60 mL/min (ref >60)
Glucose, Bld: 125 mg/dL — ABNORMAL HIGH (ref 70–99)
Potassium: 3.9 mmol/L (ref 3.5–5.1)
Sodium: 137 mmol/L (ref 135–145)

## 2022-02-03 LAB — BLOOD GAS, ARTERIAL
Acid-Base Excess: 15.8 mmol/L — ABNORMAL HIGH (ref 0.0–2.0)
Bicarbonate: 45.1 mmol/L — ABNORMAL HIGH (ref 20.0–28.0)
FIO2: 60 %
MECHVT: 630 mL
Mechanical Rate: 12
O2 Saturation: 92.3 %
PEEP: 8 cmH2O
Patient temperature: 37
RATE: 12 resp/min
pCO2 arterial: 78 mmHg (ref 32–48)
pH, Arterial: 7.37 (ref 7.35–7.45)
pO2, Arterial: 63 mmHg — ABNORMAL LOW (ref 83–108)

## 2022-02-03 LAB — GLUCOSE, CAPILLARY
Glucose-Capillary: 99 mg/dL (ref 70–99)
Glucose-Capillary: 107 mg/dL — ABNORMAL HIGH (ref 70–99)
Glucose-Capillary: 141 mg/dL — ABNORMAL HIGH (ref 70–99)
Glucose-Capillary: 151 mg/dL — ABNORMAL HIGH (ref 70–99)
Glucose-Capillary: 116 mg/dL — ABNORMAL HIGH (ref 70–99)
Glucose-Capillary: 103 mg/dL — ABNORMAL HIGH (ref 70–99)

## 2022-02-03 LAB — PHOSPHORUS: Phosphorus: 2.3 mg/dL — ABNORMAL LOW (ref 2.5–4.6)

## 2022-02-03 LAB — TRIGLYCERIDES
Triglycerides: 2338 mg/dL — ABNORMAL HIGH (ref <150)
Triglycerides: 110 mg/dL (ref <150)

## 2022-02-03 LAB — MAGNESIUM: Magnesium: 2.6 mg/dL — ABNORMAL HIGH (ref 1.7–2.4)

## 2022-02-03 MED ORDER — IPRATROPIUM-ALBUTEROL 0.5-2.5 (3) MG/3ML IN SOLN
3.0000 mL | Freq: Four times a day (QID) | RESPIRATORY_TRACT | Status: DC
  Administered 2022-02-03 – 2022-02-05 (×9): 3 mL via RESPIRATORY_TRACT
  Filled 2022-02-03 (×8): qty 3

## 2022-02-03 MED ORDER — PROSOURCE TF PO LIQD
90.0000 mL | Freq: Four times a day (QID) | ORAL | Status: DC
  Administered 2022-02-03 – 2022-02-05 (×7): 90 mL
  Filled 2022-02-03 (×10): qty 90

## 2022-02-03 MED ORDER — K PHOS MONO-SOD PHOS DI & MONO 155-852-130 MG PO TABS
500.0000 mg | ORAL_TABLET | Freq: Every day | ORAL | Status: AC
  Administered 2022-02-03: 12:00:00 500 mg
  Filled 2022-02-03: qty 2

## 2022-02-03 MED ORDER — VITAL 1.5 CAL PO LIQD
1000.0000 mL | ORAL | Status: DC
  Administered 2022-02-03 – 2022-02-04 (×2): 1000 mL

## 2022-02-03 MED ORDER — NUTRISOURCE FIBER PO PACK
1.0000 | PACK | Freq: Two times a day (BID) | ORAL | Status: DC
  Administered 2022-02-03 – 2022-02-05 (×5): 1
  Filled 2022-02-03 (×6): qty 1

## 2022-02-03 NOTE — Progress Notes (Signed)
Nutrition Follow Up Note  ? ?DOCUMENTATION CODES:  ? ?Obesity unspecified ? ?INTERVENTION:  ? ?Change to Vital 1.5@50ml/hr + ProSource TF 90ml QID via tube  ? ?Propofol: 24.5 ml/hr- provides 647kcal/day  ? ?Free water flushes 50ml q4 hours ? ?Regimen provides 2687kcal/day, 147g/day protein and 1216ml/day of free water  ? ?Add Nutrisource fiber BID via tube  ? ?NUTRITION DIAGNOSIS:  ? ?Inadequate oral intake related to inability to eat (pt sedated and ventilated) as evidenced by NPO status. ? ?GOAL:  ? ?Provide needs based on ASPEN/SCCM guidelines ?-met with tube feeds  ? ?MONITOR:  ? ?Vent status, Labs, Weight trends, Skin, I & O's ? ?ASSESSMENT:  ? ?61 y/o male with h/o asthma, COPD and currently smoker who is admitted with severe COPD exacerbation. ? ?Pt remains sedated and ventilated. OGT in place. Pt tolerating tube feeds well at goal rate. Will adjust current tube feed regimen as Vital AF is currently on backorder. Rectal tube remains in place and with large amounts of watery stool. Lactulose discontinued yesterday, will discontinue bowel regimen today and add fiber. Per chart, pt is weight stable since admission. Pt +557ml on his I & Os. Palliative care following for GOC. Pt failed SBTs and may require trach/PEG.  ? ?Medications reviewed and include: lovenox, lasix, insulin, lactulose, solu-medrol, protonix, miralax, senokot, cefepime, propofol  ? ?Labs reviewed: K 3.9 wnl, BUN 42(H), P 2.3(L), Mg 2.6(H) ?Cbgs- 107, 99 x 24 hrs ? ?Patient is currently intubated on ventilator support ?MV: 12.0 L/min ?Temp (24hrs), Avg:99.4 ?F (37.4 ?C), Min:99.1 ?F (37.3 ?C), Max:99.7 ?F (37.6 ?C) ? ?Propofol: 24.5 ml/hr- provides 647kcal/day  ? ?MAP- >65mmHg  ? ?UOP- 2475ml  ? ?Diet Order:   ? ?Diet Order   ? ?       ?  Diet NPO time specified  Diet effective now       ?  ? ?  ?  ? ?  ? ?EDUCATION NEEDS:  ? ?No education needs have been identified at this time ? ?Skin:  Skin Assessment: Reviewed RN Assessment ? ?Last BM:  3/9-  2200ml via rectal tube ? ?Height:  ? ?Ht Readings from Last 1 Encounters:  ?01/28/22 6' 0.01" (1.829 m)  ? ? ?Weight:  ? ?Wt Readings from Last 1 Encounters:  ?01/31/22 103 kg  ? ? ?Ideal Body Weight:  80.9 kg ? ?BMI:  Body mass index is 30.79 kg/m?. ? ?Estimated Nutritional Needs:  ? ?Kcal:  2533kcal/day ? ?Protein:  >160g/day ? ?Fluid:  2.4-2.7L/day ? ?Casey Campbell MS, RD, LDN ?Please refer to AM[facility] for RD and/or RD on-call/weekend/after hours pager ? ? ?

## 2022-02-03 NOTE — TOC Progression Note (Signed)
Transition of Care (TOC) - Progression Note  ? ? ?Patient Details  ?Name: Kenneth Benson ?MRN: [id number] ?Date of Birth: 1961-02-11 ? ?Transition of Care (TOC) CM/SW Contact  ?Allayne Butcher, RN ?Phone Number: ?02/03/2022, 11:55 AM ? ?Clinical Narrative:    ? ?Next of kin, sister, Raynelle Fanning is aware that patient in the hospital.  She has been updated by critical care team and palliative.  Plan is likely for patient to get a trach to more easily wean the vent.  Patient remains intubated and sedated in ICU.   ? ?Expected Discharge Plan:  (TBD) ?Barriers to Discharge: Continued Medical Work up ? ?Expected Discharge Plan and Services ?Expected Discharge Plan:  (TBD) ?  ?  ?  ?  ?                ?  ?  ?  ?  ?  ?  ?  ?  ?  ?  ? ? ?Social Determinants of Health (SDOH) Interventions ?  ? ?Readmission Risk Interventions ?No flowsheet data found. ? ?

## 2022-02-03 NOTE — Progress Notes (Signed)
NAME:  Kenneth Benson, MRN:  [id number], DOB:  [birth year], LOS: 48 ADMISSION DATE:  01/21/2022, CONSULTATION DATE: 01/21/2022 REFERRING MD: Dr. Cheri Fowler, CHIEF COMPLAINT: Shortness of Breath    History of Present Illness:  This is a 61 y.o. male who presented to [facility] ER via EMS on 02/24 with c/o shortness of breath, cough, wheezing, and congestion onset of symptoms several days prior to presentation. He reported using his inhaler without relief of symptoms.  EMS reported pt severely hypoxic with O2 sats in the 60's.  He received duoneb's x2 and 125 mg of iv solumedrol en route to the ER.  All hx obtained from chart review pt currently sedated and mechanically intubated with no family at bedside.   ED Course Upon arrival to the ER pt noted to be in moderate respiratory distress with diffuse wheezes per ED provider notes.  CXR, COVID-19/Influenza A&B, and pct negative.  VBG revealed pH 7.17/pCO2 99/bicarb 36.1.  EKG revealed sinus tachycardia hr 140 without ST elevation/depression and troponin 9.  He received duoneb x3 and 2 g of iv magnesium.  He was subsequently placed on Bipap with initial improvement of respiratory status.  However, respiratory status rapidly declined and pt required mechanical intubation.  He received an additional 2 g of iv magnesium and duoneb post intubation.  PCCM team contacted for ICU admission.  02/01/22- patient remains tachyarrythymic s/p amiodarone IV without response, reviewed with cardiology team.  Will switch sedation to precedex. Today. I spoke with GF [name] today and she was able to provide to [place] patients Sister contact information which has been uploaded to her chart. 02/02/22- patient improved with reduced FIO2 from 55 to 40. ENT consult for possible trache.Sister of patietn was finally located and she will come in to see patient tonight.  02/03/22- patient is weaned off sedation and tried SBT with failure after <76mn on 5/5 and we are planning to try again today.    Pertinent  Medical History  COPD Current Smoker  Asthma   Significant Hospital Events: Including procedures, antibiotic start and stop dates in addition to other pertinent events   02/24: Pt admitted to ICU with acute on chronic hypercapnic hypoxic respiratory failure secondary to asthma and COPD exacerbation requiring mechanical intubation  02/25: Remains intubated, vent changes allowed 02/27: Remains intubated, slowly weaning vent support. Respiratory culture with normal respiratory flora 03/01: Cardizem and Heparin gtts initiated by Cardiology for AF. Unable to tolerate decrease in sedation due to Tachycardia and hypoxia. 03/02: Sedation lightened for WUA, delirious but able to follow simple commands.  CXR concerning for possible pulmonary edema, diuresing with Diamox + Metolazone.  Heparin gtt changed to treatment dose Lovenox 03/4: Pt severely hypoxic overnight with elevated peak pressures requiring following ventilator changes: FiO2 90% and PEEP 10. ETT exchanged.  Amiodarone bolus followed by drip started due to atrial fibrillation with rvr 3/5: continued difficulty achieving rate control; remains on high vent settings 01/31/22- reduced sedation with dc of fentanyl and minimal propofol with initiation of ketamine due to status asthmaticus in context of mixed viral and bacterial LRTI    Objective   Blood pressure 127/75, pulse (!) 46, temperature 99.3 F (37.4 C), temperature source Bladder, resp. rate (!) 28, height 6' 0.01" (1.829 m), weight 103 kg, SpO2 92 %.    Vent Mode: PRVC FiO2 (%):  [30 %-40 %] 30 % Set Rate:  [18 bmp] 18 bmp Vt Set:  [470 mL] 470 mL PEEP:  [5 cAUQ33-3cmH20] 5Honomu  Pressure:  [15 cmH20] 15 cmH20   During rounds was able to switch patient to PRVC: Vent Mode: PRVC FiO2 (%):  [30 %-40 %] 30 % Set Rate:  [18 bmp] 18 bmp Vt Set:  [470 mL] 470 mL PEEP:  [5 cmH20-8 cmH20] 5 cmH20 Plateau Pressure:  [15 cmH20] 15 cmH20   Intake/Output Summary  (Last 24 hours) at 02/03/2022 1108 Last data filed at 02/03/2022 1007 Gross per 24 hour  Intake 797.53 ml  Output 4100 ml  Net -3302.47 ml    Filed Weights   01/27/22 0500 01/28/22 0456 01/31/22 0500  Weight: 100.4 kg 102.2 kg 103 kg   Examination: General: Acutely ill appearing male, sedated and mechanically ventilated, in NAD Lungs: rhonchi with inspiratory and expiratory wheezing  Cardiovascular: irregularly irregular, no M/R/G, 2+ radial/2+ distal pulses, 1+ pitting edema of BLE Abdomen: +BS x4, obese, soft, non tender, non distended  Neuro: Heavily sedated RASS -4 to 5 GU: indwelling foley draining clear yellow urine   Resolved Hospital Problem list   Acute kidney injury likely due to ATN Mild hyponatremia, hypochloremia  Assessment & Plan:   Acute on chronic hypoxic hypercapnic respiratory failure Status asthmaticus Rhinovirus bronchiolitis Concern for possible VAP~respiratory culture 03/3 moderate gram+ cocci in pair and moderate gram negative rods -Full vent support, implement lung protective strategies -Plateau pressures less than 30 cm H20 -Wean FiO2 & PEEP as tolerated to maintain O2 sats >88% -Follow intermittent Chest X-ray & ABG as needed -Spontaneous Breathing Trials when respiratory parameters met and mental status permits -Implement VAP Bundle -Prn vecuronium for vent dyssynchrony  -CT Chest today -Continue frequent Duo-Nebs, Brovana, Pulmicort nebs -Continue methylpred 60 mg IV q8h  -Sent IgE, Strogyloides Ab yesterday given persistent eos on differential -Continue cefepime and vancomycin pending respiratory culture results  -Pt will likely need tracheostomy, however fever and worsened respiratory status precludes trach at this time\ Metapneumovirus NOT DETECTED NOT DETECTED   Rhinovirus / Enterovirus NOT DETECTED DETECTED Abnormal    Influenza A NOT DETECTED NOT DETECTED    0 Result Notes    Component 3 d ago  Specimen Description TRACHEAL ASPIRATE   Performed at [facility], [address] [place], [place] [address]   Special Requests NONE  Performed at [facility], [address] [place], [place] [address]   Gram Stain FEW WBC PRESENT, PREDOMINANTLY MONONUCLEAR  MODERATE GRAM NEGATIVE RODS  FEW GRAM POSITIVE COCCI IN PAIRS   Culture ABUNDANT HAEMOPHILUS INFLUENZAE  BETA LACTAMASE NEGATIVE  Performed at [facility], [place] [address], [place], [place] [address]   Report Status 01/31/2022 FINAL      Cardiogenic pulmonary edema Recurrent atrial fibrillation with rvr Intermittent ventricular bigeminy with frequent PVC's  TTE 01/21/22:  Left ventricular ejection fraction, by estimation, is 60  to 65%. The left ventricle has normal function. The left ventricle has no  regional wall motion abnormalities. The left ventricular internal cavity  size was mildly dilated. There is  borderline concentric left ventricular hypertrophy. The interventricular  septum is flattened in diastole ('D' shaped left ventricle), consistent with right ventricular volume overload. Left ventricular diastolic parameters were normal.  -Continuous telemetry monitoring  -Continue amiodarone gtt; re-bolus today -Start scheduled Cardizem PO -Awaiting formal read of bubble study -Diurese for goal 1L negative today -Replace electrolytes as indicated  -Monitor UOP  -Continue therapeutic lovenox -Cardiology consulted appreciate input   Constipation -Continue current bowel regimen   Sedation needs in the setting of mechanical ventilation -Maintain  a RASS goal of -4 to -5 due to worsening hypoxia  -Fentanyl and Propofol gtts along with prn vecuronium as needed to maintain RASS goal  Hyperglycemia, likely due to critical illness and steroids Hgb A1c 4.9 -CBG's q4h; Target range of 140 to 180 -SSI -Follow ICU Hypo/Hyperglycemia protocol  Best Practice (right click and "Reselect all SmartList Selections" daily)   Diet/type: NPO, tube  feeds DVT prophylaxis: Therapeutic Lovenox  GI prophylaxis: PPI Lines: Central line and Arterial Line; will d/c CVC today and place PICC Foley:  Yes, and it is still needed Code Status:  full code Last date of multidisciplinary goals of care discussion [01/27/2022]  Significant other [name] Hostetter is only current contact. Need further clarification on status of additional family members to determine decision-maker. Labs   CBC: Recent Labs  Lab 01/28/22 0446 01/29/22 0420 01/30/22 0531 01/31/22 0500 02/01/22 0500 02/02/22 0636 02/03/22 0600  WBC 8.9   9.2 9.6   9.7 8.7 8.6 11.3* 8.0 9.7  NEUTROABS 7.1 6.7 7.5  --   --   --   --   HGB 12.6*   12.5* 13.8   13.7 12.5* 12.0* 13.2 13.2 14.0  HCT 41.1   40.7 44.8   44.1 40.6 38.1* 41.3 41.6 42.6  MCV 99.8   98.8 98.0   97.1 96.9 98.2 96.7 96.3 98.2  PLT 176   166 198   182 150 153 202 164 170     Basic Metabolic Panel: Recent Labs  Lab 01/30/22 0531 01/31/22 0500 02/01/22 0500 02/02/22 0636 02/03/22 0600  NA 143 142 142 148* 137  K 4.2 3.7 3.7 4.0 3.9  CL 99 96* 95* 103 94*  CO2 37* 38* 37* 37* 37*  GLUCOSE 151* 202* 187* 124* 125*  BUN 46* 48* 52* 46* 42*  CREATININE 0.78 0.88 0.85 0.58* 0.61  CALCIUM 8.2* 7.8* 7.7* 7.8* 7.9*  MG 2.7* 2.3 2.4 2.2 2.6*  PHOS 3.3 2.4* 2.4* 3.3 2.3*    GFR: Estimated Creatinine Clearance: 123.5 mL/min (by C-G formula based on SCr of 0.61 mg/dL). Recent Labs  Lab 01/29/22 0420 01/30/22 0531 01/31/22 0500 02/01/22 0500 02/02/22 0636 02/03/22 0600  PROCALCITON <0.10  --   --   --   --   --   WBC 9.6   9.7   < > 8.6 11.3* 8.0 9.7   < > = values in this interval not displayed.     Liver Function Tests: Recent Labs  Lab 01/29/22 0420  AST 13*  ALT 20  ALKPHOS 40  BILITOT 0.7  PROT 7.1  ALBUMIN 2.8*    No results for input(s): LIPASE, AMYLASE in the last 168 hours. No results for input(s): AMMONIA in the last 168 hours.  ABG    Component Value Date/Time   PHART  7.49 (H) 01/30/2022 2034   PCO2ART 50 (H) 01/30/2022 2034   PO2ART 66 (L) 01/30/2022 2034   HCO3 38.1 (H) 01/30/2022 2034   O2SAT 96.8 01/30/2022 2034      Coagulation Profile: No results for input(s): INR, PROTIME in the last 168 hours.   Cardiac Enzymes: No results for input(s): CKTOTAL, CKMB, CKMBINDEX, TROPONINI in the last 168 hours.  HbA1C: Hgb A1c MFr Bld  Date/Time Value Ref Range Status  01/21/2022 02:36 PM 4.9 4.8 - 5.6 % Final    Comment:    (NOTE) Pre diabetes:          5.7%-6.4%  Diabetes:              >  6.4%  Glycemic control for   <7.0% adults with diabetes     CBG: Recent Labs  Lab 02/02/22 1619 02/02/22 1944 02/02/22 2331 02/03/22 0350 02/03/22 0724  GLUCAP 131* 108* 96 99 107*     Review of Systems:   Unable to assess pt mechanically ventilated  Allergies No Known Allergies   Scheduled Meds:  amiodarone  200 mg Per Tube BID   arformoterol  15 mcg Nebulization BID   budesonide (PULMICORT) nebulizer solution  0.25 mg Nebulization BID   chlorhexidine gluconate (MEDLINE KIT)  15 mL Mouth Rinse BID   Chlorhexidine Gluconate Cloth  6 each Topical Q0600   diltiazem  90 mg Per Tube Q6H   enoxaparin (LOVENOX) injection  1 mg/kg Subcutaneous BID   feeding supplement (PROSource TF)  45 mL Per Tube QID   free water  50 mL Per Tube Q4H   furosemide  40 mg Intravenous Q12H   insulin aspart  0-15 Units Subcutaneous Q4H   ipratropium-albuterol  3 mL Nebulization Q6H   mouth rinse  15 mL Mouth Rinse 10 times per day   pantoprazole (PROTONIX) IV  40 mg Intravenous Q24H   phosphorus  500 mg Per Tube Daily   polyethylene glycol  17 g Per Tube Daily   predniSONE  50 mg Per Tube Q breakfast   senna-docusate  1 tablet Per Tube BID   sodium chloride flush  10-40 mL Intracatheter Q12H   sodium chloride flush  10-40 mL Intracatheter Q12H   Continuous Infusions:  sodium chloride Stopped (01/25/22 1112)   feeding supplement (VITAL AF 1.2 CAL) 1,000 mL  (02/02/22 1445)   fentaNYL infusion INTRAVENOUS Stopped (01/31/22 1434)   propofol (DIPRIVAN) infusion 40 mcg/kg/min (02/03/22 1008)   PRN Meds:.acetaminophen (TYLENOL) oral liquid 160 mg/5 mL, albuterol, docusate, fentaNYL (SUBLIMAZE) injection, fentaNYL (SUBLIMAZE) injection, metoprolol tartrate, midazolam, ondansetron (ZOFRAN) IV, polyethylene glycol, sodium chloride flush, sodium chloride flush, vecuronium   Critical care provider statement:   Total critical care time: 33 minutes   Performed by: Lanney Gins MD   Critical care time was exclusive of separately billable procedures and treating other patients.   Critical care was necessary to treat or prevent imminent or life-threatening deterioration.   Critical care was time spent personally by me on the following activities: development of treatment plan with patient and/or surrogate as well as nursing, discussions with consultants, evaluation of patient's response to treatment, examination of patient, obtaining history from patient or surrogate, ordering and performing treatments and interventions, ordering and review of laboratory studies, ordering and review of radiographic studies, pulse oximetry and re-evaluation of patient's condition.    Ottie Glazier, M.D.  Pulmonary & Critical Care Medicine

## 2022-02-03 NOTE — Progress Notes (Signed)
[facility] Cardiology ? ?Patient ID: ?Kenneth Benson ?MRN: [id number] ?DOB/AGE: January 12, 1961 61 y.o. ?  ?Admit date: 01/21/2022 ?Referring Physician Dr. Valora Piccolo ?Primary Physician  ?Primary Cardiologist  ?Reason for Consultation EKG changes ?  ?HPI: The patient is a 61 year old male with a past medical history notable for asthma, COPD, and current tobacco use who presented to [facility] ED in the early morning hours complaining of shortness of breath with worsening cough and congestion.  Per EMS reporting the patient's O2 sats were in the 60s and he was given 2 DuoNebs and Solu-Medrol on route.  He presented to the ED in moderate respiratory distress with diffuse wheezing and had chest tightness without chest pain.  He was given DuoNebs x3 and 2 g of IV mag and was subsequently placed on BiPAP and eventually required intubation due to worsening respiratory status.  Cardiology is consulted because his EKG changes and ventricular bigeminy on admission with periods of breakthrough new AF with RVR.  ? ?Interval History: ?-back in NSR with bigeminy, HR 100-120 ?-afebrile, difficulty weaning vent settings due to agitation.  ? ? ?Vitals:  ? 02/03/22 0800 02/03/22 0825 02/03/22 0900 02/03/22 1000  ?BP: 115/69  (!) 107/54 127/75  ?Pulse: (!) 46 (!) 106 68 (!) 46  ?Resp: (!) 25 (!) 24 (!) 28 (!) 28  ?Temp:      ?TempSrc:      ?SpO2: 98% 99% 98% 92%  ?Weight:      ?Height:      ? ? ? ?Intake/Output Summary (Last 24 hours) at 02/03/2022 1115 ?Last data filed at 02/03/2022 1007 ?Gross per 24 hour  ?Intake 797.53 ml  ?Output 4100 ml  ?Net -3302.47 ml  ? ? ? ?PHYSICAL EXAM ?General: Acutely ill-appearing middle-aged Caucasian male, intubated in the ICU  ?HEENT:  Normocephalic and atraumatic. ?Neck:   No JVD.  ?Lungs: Intubated. Air movement somewhat improving, coarse breath sounds and without wheezes  ?Heart: tachy but regular rhythm with frequent extra beats. Normal S1 and S2 without gallops or murmurs. Radial & DP pulses 2+ bilaterally. ?Abdomen:  Soft, obese ?Msk: Normal tone for age. ?Extremities: Warm and well perfused. No clubbing, cyanosis.  Trace upper and lower extremity edema bilaterally. ?Neuro: unable to assess. ?Psych: Unable to assess due to sedation ? ? ?LABS: ?Basic Metabolic Panel: ?Recent Labs  ?  02/02/22 ?0636 02/03/22 ?0600  ?NA 148* 137  ?K 4.0 3.9  ?CL 103 94*  ?CO2 37* 37*  ?GLUCOSE 124* 125*  ?BUN 46* 42*  ?CREATININE 0.58* 0.61  ?CALCIUM 7.8* 7.9*  ?MG 2.2 2.6*  ?PHOS 3.3 2.3*  ? ? ?Liver Function Tests: ?No results for input(s): AST, ALT, ALKPHOS, BILITOT, PROT, ALBUMIN in the last 72 hours. ? ?No results for input(s): LIPASE, AMYLASE in the last 72 hours. ?CBC: ?Recent Labs  ?  02/02/22 ?0636 02/03/22 ?0600  ?WBC 8.0 9.7  ?HGB 13.2 14.0  ?HCT 41.6 42.6  ?MCV 96.3 98.2  ?PLT 164 170  ? ? ?Cardiac Enzymes: ?No results for input(s): CKTOTAL, CKMB, CKMBINDEX, TROPONINI in the last 72 hours. ?BNP: ?Invalid input(s): POCBNP ?D-Dimer: ?No results for input(s): DDIMER in the last 72 hours. ?Hemoglobin A1C: ?No results for input(s): HGBA1C in the last 72 hours. ?Fasting Lipid Panel: ?Recent Labs  ?  02/03/22 ?0600  ?TRIG 2,338*  ? ? ?Thyroid Function Tests: ?No results for input(s): TSH, T4TOTAL, T3FREE, THYROIDAB in the last 72 hours. ? ?Invalid input(s): FREET3 ? ?Anemia Panel: ?No results for input(s): VITAMINB12, FOLATE, FERRITIN, TIBC,  IRON, RETICCTPCT in the last 72 hours. ? ?No results found. ? ? ?Echo LVEF 60-65% ? ?TELEMETRY: sinus rhythm with ventricular bigeminy, rate 100-120 ? ?ASSESSMENT AND PLAN: ? ?Principal Problem: ?  Asthma exacerbation ?Active Problems: ?  COPD exacerbation ([place]) ?  Sepsis ([place]) ?  Tobacco abuse ?  Acute respiratory failure with hypoxia and hypercapnia ([facility]) ?  Acute on chronic respiratory failure with hypercapnia ([facility]) ?  Arterial line in place ?  ? ?1. Atrial fibrillation with RVR, compensatory for underlying respiratory failure, on Lovenox for stroke prevention. Back in NSR 3/9.  ?2.  Frequent PVCs of  uncertain clinical significance, normal left ventricular function ?3.  Respiratory failure / asthma / COPD exacerbation / severe ARDS / pansinusitis intubated. ? ?Recommendations ?  ?1.  Agree with current therapy per PCCM ?2.  received roughly 9g of amiodarone IV since admission, continue 200mg  BID per tube BID and continue  diltiazem per tube 90mg  q6h.  ?3.  Continue Lovenox for stroke prevention ?4.  Agree with lasix 40mg  BID for volume overload    ?5.  Defer further cardiac diagnostics at this time ? ?This patient's plan of care was discussed and created with Dr. Donnelly Angelica and he is in agreement.   ? ?Tristan Schroeder, PA-C ?02/03/2022 ?11:15 AM ? ? ? ?  ?

## 2022-02-03 NOTE — Progress Notes (Signed)
GOALS OF CARE FAMILY CONFERENCE ? ? ?Current clinical status, hospital findings and medical plan was reviewed with family.  ? ?Updated and notified of patients ongoing immediate critical medical problems.  ? ?Patient remains with ventilator dependent respiratory failure ? ?Patient is unable to breathe independently, unable to protect airway and unable to mobilize secretions.   ? ?Explained to family course of therapy and the modalities  ? ? ?Family is appreciative of care and relate understanding medical plan ? ?FAMILY WISHES FOR FULL CODE STATUS ? ?Family are satisfied with Plan of action and management. All questions answered ? ?Additional Critical Care time 35 mins ? ? ? ?[name], M.D.  ?Pulmonary & Critical Care Medicine  ?[facility]  ? ?

## 2022-02-03 NOTE — Progress Notes (Addendum)
1030 After sedation vacation patient awake and following very simple commands. Tracks nurses movement with his eyes. Became tachypneic. Sedation restarted. 1130 Restarted on sedation vacation per verbal order of Dr. Karna Christmas. Peep started at 10 per verbal order on ventilator spontaneous mode. Peep decreased to 8 at 1245. Patient alert and trying to stay calm. Nods head to questions. Respiratory rate 28-30, SPO2 93-97%.1315 Patient coughing up copious secretions . Work of breathing has also increased. Patient re sedated and placed back on full ventilator support.1600 Tracheostomy consent signed per sister - Dierdre Searles. Tracheostomy procedure  explained per Dr. Karna Christmas. Patient remains in NSR with frequent PVCs. ?

## 2022-02-03 NOTE — Consult Note (Signed)
PHARMACY CONSULT NOTE ? ?Pharmacy Consult for Electrolyte Monitoring and Replacement  ? ?Recent Labs: ?Potassium (mmol/L)  ?Date Value  ?02/03/2022 3.9  ? ?Magnesium (mg/dL)  ?Date Value  ?02/03/2022 2.6 (H)  ? ?Calcium (mg/dL)  ?Date Value  ?02/03/2022 7.9 (L)  ? ?Albumin (g/dL)  ?Date Value  ?01/29/2022 2.8 (L)  ? ?Phosphorus (mg/dL)  ?Date Value  ?02/03/2022 2.3 (L)  ? ?Sodium (mmol/L)  ?Date Value  ?02/03/2022 137  ? ?Corrected Ca: 8.76 mg/dL ? ?Assessment: ?Patient is a 61 y/o M with medical history including COPD / asthma and tobacco abuse who presented to the ED 2/24 with acute respiratory failure secondary to asthma / COPD exacerbation. Patient ultimately required intubation. Disposition is the ICU. Pharmacy consulted to assist with electrolyte monitoring and replacement as indicated. ? ?Nutrition:  ?Vital HP at 60>48ml/hr + ProSource TF 71ml TID via tube  ?Free water flushes 30>71mL q4 hours ? ?Diuretics: ?+ lasix 40mg  IV BID (3/6-3/10) [I/O: +1L; UOP 2.2>0.8 ml/k/h] ? ?Goal of Therapy:  ?Potassium 4.0 - 5.1 mmol/L ?Magnesium 2.0 - 2.4 mg/dL ?All Other Electrolytes within normal limits ? ?Plan:  ?Scr 0.58>0.61; some refeeding syndrome apparent. ?K 4>3.9: no repletion at this time ?Phos: 3.3>2.3: Increased PROsource TID > QID; inc'd TF 60>79ml/h. ?Will give Kphos 500mg  PO/per tube x1 dose. ?Follow-up electrolytes with AM labs  ? ?77m, PharmD, BCCP ?Clinical Pharmacist ?02/03/2022 9:50 AM ? ? ? ? ?

## 2022-02-03 NOTE — Progress Notes (Signed)
Palliative: ?[name], is lying quietly in bed.  He is weaning on the ventilator.  Nursing staff is at bedside attending to needs.  There is no family present at this time.  Detail conversation with bedside nursing staff.  Detailed conversation with transition of care team. ?PMT to follow 3/13. ? ?Plan: Anticipate need for trach for continued ventilator weaning. ? ?No charge ?Quinn Axe, NP ?Palliative medicine team ?Team phone [phone] ?Greater than 50% of this time was spent counseling and coordinating care related to the above assessment and plan. ?

## 2022-02-04 LAB — CBC WITH DIFFERENTIAL/PLATELET
Abs Immature Granulocytes: 0.26 10*3/uL — ABNORMAL HIGH (ref 0.00–0.07)
Basophils Absolute: 0 10*3/uL (ref 0.0–0.1)
Basophils Relative: 0 %
Eosinophils Absolute: 0.1 10*3/uL (ref 0.0–0.5)
Eosinophils Relative: 1 %
HCT: 44.6 % (ref 39.0–52.0)
Hemoglobin: 14.2 g/dL (ref 13.0–17.0)
Immature Granulocytes: 2 %
Lymphocytes Relative: 12 %
Lymphs Abs: 1.5 10*3/uL (ref 0.7–4.0)
MCH: 31.2 pg (ref 26.0–34.0)
MCHC: 31.8 g/dL (ref 30.0–36.0)
MCV: 98 fL (ref 80.0–100.0)
Monocytes Absolute: 0.6 10*3/uL (ref 0.1–1.0)
Monocytes Relative: 5 %
Neutro Abs: 9.8 10*3/uL — ABNORMAL HIGH (ref 1.7–7.7)
Neutrophils Relative %: 80 %
Platelets: 198 10*3/uL (ref 150–400)
RBC: 4.55 MIL/uL (ref 4.22–5.81)
RDW: 14.6 % (ref 11.5–15.5)
WBC: 12.3 10*3/uL — ABNORMAL HIGH (ref 4.0–10.5)
nRBC: 0 % (ref 0.0–0.2)

## 2022-02-04 LAB — BASIC METABOLIC PANEL
Anion gap: 5 (ref 5–15)
BUN: 48 mg/dL — ABNORMAL HIGH (ref 6–20)
CO2: 39 mmol/L — ABNORMAL HIGH (ref 22–32)
Calcium: 8.3 mg/dL — ABNORMAL LOW (ref 8.9–10.3)
Chloride: 103 mmol/L (ref 98–111)
Creatinine, Ser: 0.78 mg/dL (ref 0.61–1.24)
GFR, Estimated: >60 mL/min (ref >60)
Glucose, Bld: 148 mg/dL — ABNORMAL HIGH (ref 70–99)
Potassium: 3.6 mmol/L (ref 3.5–5.1)
Sodium: 147 mmol/L — ABNORMAL HIGH (ref 135–145)

## 2022-02-04 LAB — GLUCOSE, CAPILLARY
Glucose-Capillary: 118 mg/dL — ABNORMAL HIGH (ref 70–99)
Glucose-Capillary: 123 mg/dL — ABNORMAL HIGH (ref 70–99)
Glucose-Capillary: 129 mg/dL — ABNORMAL HIGH (ref 70–99)
Glucose-Capillary: 132 mg/dL — ABNORMAL HIGH (ref 70–99)
Glucose-Capillary: 151 mg/dL — ABNORMAL HIGH (ref 70–99)

## 2022-02-04 LAB — MAGNESIUM: Magnesium: 2.1 mg/dL (ref 1.7–2.4)

## 2022-02-04 LAB — CULTURE, RESPIRATORY W GRAM STAIN: Culture: NORMAL

## 2022-02-04 LAB — PHOSPHORUS: Phosphorus: 2.6 mg/dL (ref 2.5–4.6)

## 2022-02-04 MED ORDER — METOPROLOL TARTRATE 5 MG/5ML IV SOLN
5.0000 mg | Freq: Once | INTRAVENOUS | Status: AC
  Administered 2022-02-04: 5 mg via INTRAVENOUS

## 2022-02-04 MED ORDER — GLYCOPYRROLATE 0.2 MG/ML IJ SOLN: 0.1000 mg | Freq: Once | INTRAMUSCULAR | Status: DC

## 2022-02-04 MED ORDER — GLYCOPYRROLATE 0.2 MG/ML IJ SOLN
0.2000 mg | Freq: Three times a day (TID) | INTRAMUSCULAR | Status: DC
  Administered 2022-02-04 – 2022-02-08 (×11): 0.2 mg via INTRAVENOUS
  Filled 2022-02-04 (×11): qty 1

## 2022-02-04 NOTE — Progress Notes (Signed)
NAME:  Kenneth Benson, MRN:  [id number], DOB:  [birth year], LOS: 60 ADMISSION DATE:  01/21/2022, CONSULTATION DATE: 01/21/2022 REFERRING MD: Dr. Cheri Fowler, CHIEF COMPLAINT: Shortness of Breath    History of Present Illness:  This is a 61 y.o. male who presented to [facility] ER via EMS on 02/24 with c/o shortness of breath, cough, wheezing, and congestion onset of symptoms several days prior to presentation. He reported using his inhaler without relief of symptoms.  EMS reported pt severely hypoxic with O2 sats in the 60's.  He received duoneb's x2 and 125 mg of iv solumedrol en route to the ER.  All hx obtained from chart review pt currently sedated and mechanically intubated with no family at bedside.   ED Course Upon arrival to the ER pt noted to be in moderate respiratory distress with diffuse wheezes per ED provider notes.  CXR, COVID-19/Influenza A&B, and pct negative.  VBG revealed pH 7.17/pCO2 99/bicarb 36.1.  EKG revealed sinus tachycardia hr 140 without ST elevation/depression and troponin 9.  He received duoneb x3 and 2 g of iv magnesium.  He was subsequently placed on Bipap with initial improvement of respiratory status.  However, respiratory status rapidly declined and pt required mechanical intubation.  He received an additional 2 g of iv magnesium and duoneb post intubation.  PCCM team contacted for ICU admission.  02/01/22- patient remains tachyarrythymic s/p amiodarone IV without response, reviewed with cardiology team.  Will switch sedation to precedex. Today. I spoke with GF [name] today and she was able to provide to [place] patients Sister contact information which has been uploaded to her chart. 02/02/22- patient improved with reduced FIO2 from 55 to 40. ENT consult for possible trache.Sister of patietn was finally located and she will come in to see patient tonight.  02/03/22- patient is weaned off sedation and tried SBT with failure after <20mn on 5/5 and we are planning to try again today.   02/04/22- patient for repeat SBT today.   Pertinent  Medical History  COPD Current Smoker  Asthma   Significant Hospital Events: Including procedures, antibiotic start and stop dates in addition to other pertinent events   02/24: Pt admitted to ICU with acute on chronic hypercapnic hypoxic respiratory failure secondary to asthma and COPD exacerbation requiring mechanical intubation  02/25: Remains intubated, vent changes allowed 02/27: Remains intubated, slowly weaning vent support. Respiratory culture with normal respiratory flora 03/01: Cardizem and Heparin gtts initiated by Cardiology for AF. Unable to tolerate decrease in sedation due to Tachycardia and hypoxia. 03/02: Sedation lightened for WUA, delirious but able to follow simple commands.  CXR concerning for possible pulmonary edema, diuresing with Diamox + Metolazone.  Heparin gtt changed to treatment dose Lovenox 03/4: Pt severely hypoxic overnight with elevated peak pressures requiring following ventilator changes: FiO2 90% and PEEP 10. ETT exchanged.  Amiodarone bolus followed by drip started due to atrial fibrillation with rvr 3/5: continued difficulty achieving rate control; remains on high vent settings 01/31/22- reduced sedation with dc of fentanyl and minimal propofol with initiation of ketamine due to status asthmaticus in context of mixed viral and bacterial LRTI    Objective   Blood pressure 138/65, pulse (!) 39, temperature (!) 100.4 F (38 C), temperature source Bladder, resp. rate (!) 31, height 6' (1.829 m), weight 98.6 kg, SpO2 97 %.    Vent Mode: PRVC FiO2 (%):  [30 %-40 %] 40 % Set Rate:  [18 bmp] 18 bmp Vt Set:  [430 mL-470 mL] 430 mL  PEEP:  [5 cmH20] 5 cmH20 Plateau Pressure:  [13 cmH20-15 cmH20] 13 cmH20   During rounds was able to switch patient to PRVC: Vent Mode: PRVC FiO2 (%):  [30 %-40 %] 40 % Set Rate:  [18 bmp] 18 bmp Vt Set:  [430 mL-470 mL] 430 mL PEEP:  [5 cmH20] 5 cmH20 Plateau Pressure:   [13 cmH20-15 cmH20] 13 cmH20   Intake/Output Summary (Last 24 hours) at 02/04/2022 1045 Last data filed at 02/04/2022 1021 Gross per 24 hour  Intake 3433.23 ml  Output 5140 ml  Net -1706.77 ml    Filed Weights   01/28/22 0456 01/31/22 0500 02/03/22 0825  Weight: 102.2 kg 103 kg 98.6 kg   Examination: General: Acutely ill appearing male, sedated and mechanically ventilated, in NAD Lungs: rhonchi with inspiratory and expiratory wheezing  Cardiovascular: irregularly irregular, no M/R/G, 2+ radial/2+ distal pulses, 1+ pitting edema of BLE Abdomen: +BS x4, obese, soft, non tender, non distended  Neuro: Heavily sedated RASS -4 to 5 GU: indwelling foley draining clear yellow urine   Resolved Hospital Problem list   Acute kidney injury likely due to ATN Mild hyponatremia, hypochloremia  Assessment & Plan:   Acute on chronic hypoxic hypercapnic respiratory failure Status asthmaticus Rhinovirus bronchiolitis Concern for possible VAP~respiratory culture 03/3 moderate gram+ cocci in pair and moderate gram negative rods -Full vent support, implement lung protective strategies -Plateau pressures less than 30 cm H20 -Wean FiO2 & PEEP as tolerated to maintain O2 sats >88% -Follow intermittent Chest X-ray & ABG as needed -Spontaneous Breathing Trials when respiratory parameters met and mental status permits -Implement VAP Bundle -Prn vecuronium for vent dyssynchrony  -CT Chest today -Continue frequent Duo-Nebs, Brovana, Pulmicort nebs -Continue methylpred 60 mg IV q8h  -Sent IgE, Strogyloides Ab yesterday given persistent eos on differential -Continue cefepime and vancomycin pending respiratory culture results  -Pt will likely need tracheostomy, however fever and worsened respiratory status precludes trach at this time\ Metapneumovirus NOT DETECTED NOT DETECTED   Rhinovirus / Enterovirus NOT DETECTED DETECTED Abnormal    Influenza A NOT DETECTED NOT DETECTED    0 Result Notes     Component 3 d ago  Specimen Description TRACHEAL ASPIRATE  Performed at [facility], [address] [place], [place] [address]   Special Requests NONE  Performed at [facility], [address] [place], [place] [address]   Gram Stain FEW WBC PRESENT, PREDOMINANTLY MONONUCLEAR  MODERATE GRAM NEGATIVE RODS  FEW GRAM POSITIVE COCCI IN PAIRS   Culture ABUNDANT HAEMOPHILUS INFLUENZAE  BETA LACTAMASE NEGATIVE  Performed at [facility], [place] [address], [place],  [address]   Report Status 01/31/2022 FINAL      Cardiogenic pulmonary edema Recurrent atrial fibrillation with rvr Intermittent ventricular bigeminy with frequent PVC's  TTE 01/21/22:  Left ventricular ejection fraction, by estimation, is 60  to 65%. The left ventricle has normal function. The left ventricle has no  regional wall motion abnormalities. The left ventricular internal cavity  size was mildly dilated. There is  borderline concentric left ventricular hypertrophy. The interventricular  septum is flattened in diastole ('D' shaped left ventricle), consistent with right ventricular volume overload. Left ventricular diastolic parameters were normal.  -Continuous telemetry monitoring  -Continue amiodarone gtt; re-bolus today -Start scheduled Cardizem PO -Awaiting formal read of bubble study -Diurese for goal 1L negative today -Replace electrolytes as indicated  -Monitor UOP  -Continue therapeutic lovenox -Cardiology consulted appreciate input   Constipation -Continue current bowel regimen  Sedation needs in the setting of mechanical ventilation -Maintain a RASS goal of -4 to -5 due to worsening hypoxia  -Fentanyl and Propofol gtts along with prn vecuronium as needed to maintain RASS goal  Hyperglycemia, likely due to critical illness and steroids Hgb A1c 4.9 -CBG's q4h; Target range of 140 to 180 -SSI -Follow ICU Hypo/Hyperglycemia protocol  Best Practice (right click and  "Reselect all SmartList Selections" daily)   Diet/type: NPO, tube feeds DVT prophylaxis: Therapeutic Lovenox  GI prophylaxis: PPI Lines: Central line and Arterial Line; will d/c CVC today and place PICC Foley:  Yes, and it is still needed Code Status:  full code Last date of multidisciplinary goals of care discussion [01/27/2022]  Significant other [name] Hostetter is only current contact. Need further clarification on status of additional family members to determine decision-maker. Labs   CBC: Recent Labs  Lab 01/29/22 0420 01/30/22 0531 01/31/22 0500 02/01/22 0500 02/02/22 0636 02/03/22 0600 02/04/22 0430  WBC 9.6   9.7 8.7 8.6 11.3* 8.0 9.7 12.3*  NEUTROABS 6.7 7.5  --   --   --   --  9.8*  HGB 13.8   13.7 12.5* 12.0* 13.2 13.2 14.0 14.2  HCT 44.8   44.1 40.6 38.1* 41.3 41.6 42.6 44.6  MCV 98.0   97.1 96.9 98.2 96.7 96.3 98.2 98.0  PLT 198   182 150 153 202 164 170 198     Basic Metabolic Panel: Recent Labs  Lab 01/31/22 0500 02/01/22 0500 02/02/22 0636 02/03/22 0600 02/04/22 0430  NA 142 142 148* 137 147*  K 3.7 3.7 4.0 3.9 3.6  CL 96* 95* 103 94* 103  CO2 38* 37* 37* 37* 39*  GLUCOSE 202* 187* 124* 125* 148*  BUN 48* 52* 46* 42* 48*  CREATININE 0.88 0.85 0.58* 0.61 0.78  CALCIUM 7.8* 7.7* 7.8* 7.9* 8.3*  MG 2.3 2.4 2.2 2.6* 2.1  PHOS 2.4* 2.4* 3.3 2.3* 2.6    GFR: Estimated Creatinine Clearance: 120.9 mL/min (by C-G formula based on SCr of 0.78 mg/dL). Recent Labs  Lab 01/29/22 0420 01/30/22 0531 02/01/22 0500 02/02/22 0636 02/03/22 0600 02/04/22 0430  PROCALCITON <0.10  --   --   --   --   --   WBC 9.6   9.7   < > 11.3* 8.0 9.7 12.3*   < > = values in this interval not displayed.     Liver Function Tests: Recent Labs  Lab 01/29/22 0420  AST 13*  ALT 20  ALKPHOS 40  BILITOT 0.7  PROT 7.1  ALBUMIN 2.8*    No results for input(s): LIPASE, AMYLASE in the last 168 hours. No results for input(s): AMMONIA in the last 168 hours.  ABG     Component Value Date/Time   PHART 7.51 (H) 02/04/2022 0500   PCO2ART 55 (H) 02/04/2022 0500   PO2ART 59 (L) 02/04/2022 0500   HCO3 43.9 (H) 02/04/2022 0500   O2SAT 92.9 02/04/2022 0500      Coagulation Profile: No results for input(s): INR, PROTIME in the last 168 hours.   Cardiac Enzymes: No results for input(s): CKTOTAL, CKMB, CKMBINDEX, TROPONINI in the last 168 hours.  HbA1C: Hgb A1c MFr Bld  Date/Time Value Ref Range Status  01/21/2022 02:36 PM 4.9 4.8 - 5.6 % Final    Comment:    (NOTE) Pre diabetes:          5.7%-6.4%  Diabetes:              >6.4%  Glycemic control for   <7.0% adults with diabetes     CBG: Recent Labs  Lab 02/03/22 1559 02/03/22 2001 02/03/22 2323 02/04/22 0355 02/04/22 0742  GLUCAP 151* 116* 103* 123* 132*     Review of Systems:   Unable to assess pt mechanically ventilated  Allergies No Known Allergies   Scheduled Meds:  amiodarone  200 mg Per Tube BID   arformoterol  15 mcg Nebulization BID   budesonide (PULMICORT) nebulizer solution  0.25 mg Nebulization BID   chlorhexidine gluconate (MEDLINE KIT)  15 mL Mouth Rinse BID   Chlorhexidine Gluconate Cloth  6 each Topical Q0600   diltiazem  90 mg Per Tube Q6H   enoxaparin (LOVENOX) injection  1 mg/kg Subcutaneous BID   feeding supplement (PROSource TF)  90 mL Per Tube QID   fiber  1 packet Per Tube BID   free water  50 mL Per Tube Q4H   glycopyrrolate  0.2 mg Intravenous TID   insulin aspart  0-15 Units Subcutaneous Q4H   ipratropium-albuterol  3 mL Nebulization Q6H   mouth rinse  15 mL Mouth Rinse 10 times per day   pantoprazole (PROTONIX) IV  40 mg Intravenous Q24H   predniSONE  50 mg Per Tube Q breakfast   sodium chloride flush  10-40 mL Intracatheter Q12H   sodium chloride flush  10-40 mL Intracatheter Q12H   Continuous Infusions:  sodium chloride Stopped (01/25/22 1112)   feeding supplement (VITAL 1.5 CAL) 50 mL/hr at 02/04/22 0700   fentaNYL infusion INTRAVENOUS  Stopped (01/31/22 1434)   propofol (DIPRIVAN) infusion 20 mcg/kg/min (02/04/22 0912)   PRN Meds:.acetaminophen (TYLENOL) oral liquid 160 mg/5 mL, albuterol, docusate, fentaNYL (SUBLIMAZE) injection, fentaNYL (SUBLIMAZE) injection, metoprolol tartrate, midazolam, ondansetron (ZOFRAN) IV, sodium chloride flush, sodium chloride flush, vecuronium   Critical care provider statement:   Total critical care time: 33 minutes   Performed by: Lanney Gins MD   Critical care time was exclusive of separately billable procedures and treating other patients.   Critical care was necessary to treat or prevent imminent or life-threatening deterioration.   Critical care was time spent personally by me on the following activities: development of treatment plan with patient and/or surrogate as well as nursing, discussions with consultants, evaluation of patient's response to treatment, examination of patient, obtaining history from patient or surrogate, ordering and performing treatments and interventions, ordering and review of laboratory studies, ordering and review of radiographic studies, pulse oximetry and re-evaluation of patient's condition.    Ottie Glazier, M.D.  Pulmonary & Critical Care Medicine

## 2022-02-04 NOTE — Consult Note (Signed)
PHARMACY CONSULT NOTE ? ?Pharmacy Consult for Electrolyte Monitoring and Replacement  ? ?Recent Labs: ?Potassium (mmol/L)  ?Date Value  ?02/04/2022 3.6  ? ?Magnesium (mg/dL)  ?Date Value  ?02/04/2022 2.1  ? ?Calcium (mg/dL)  ?Date Value  ?02/04/2022 8.3 (L)  ? ?Albumin (g/dL)  ?Date Value  ?01/29/2022 2.8 (L)  ? ?Phosphorus (mg/dL)  ?Date Value  ?02/04/2022 2.6  ? ?Sodium (mmol/L)  ?Date Value  ?02/04/2022 147 (H)  ? ?Corrected Ca: 8.76 mg/dL ? ?Assessment: ?Patient is a 61 y/o M with medical history including COPD / asthma and tobacco abuse who presented to the ED 2/24 with acute respiratory failure secondary to asthma / COPD exacerbation. Patient ultimately required intubation. Disposition is the ICU. Pharmacy consulted to assist with electrolyte monitoring and replacement as indicated. ? ?Nutrition:  ?Vital HP at 60>43ml/hr + ProSource TF 29ml TID via tube  ?Free water flushes 30>63mL q4 hours ? ?Diuretics: ?+ lasix 40mg  IV BID (3/6-3/10) [I/O: -12-17-1977; UOP 0.8>2 ml/k/h] ? ?Goal of Therapy:  ?Potassium 4.0 - 5.1 mmol/L ?Magnesium 2.0 - 2.4 mg/dL ?All Other Electrolytes within normal limits ? ?Plan:  ?Scr 0.58>0.61>0.78; refeeding syndrome apparent. No repletion at this time; lasix order ending today. ?Na: 137>147 ?K 3.9>3.6: no repletion at this time ?Phos: 2.3>2.6: Increased PROsource TID > QID; inc'd TF 60>19ml/h. ?Follow-up electrolytes with AM labs  ? ?77m, PharmD, BCCP ?Clinical Pharmacist ?02/04/2022 9:39 AM ? ? ? ? ?

## 2022-02-04 NOTE — Progress Notes (Signed)
[facility] Cardiology ? ?Patient ID: ?Kenneth Benson ?MRN: [id number] ?DOB/AGE: 1961/05/12 61 y.o. ?  ?Admit date: 01/21/2022 ?Referring Physician Dr. Donna Bernard ?Primary Physician  ?Primary Cardiologist  ?Reason for Consultation EKG changes ?  ?HPI: The patient is a 61 year old male with a past medical history notable for asthma, COPD, and current tobacco use who presented to [facility] ED in the early morning hours complaining of shortness of breath with worsening cough and congestion.  Per EMS reporting the patient's O2 sats were in the 60s and he was given 2 DuoNebs and Solu-Medrol on route.  He presented to the ED in moderate respiratory distress with diffuse wheezing and had chest tightness without chest pain.  He was given DuoNebs x3 and 2 g of IV mag and was subsequently placed on BiPAP and eventually required intubation due to worsening respiratory status.  Cardiology is consulted because his EKG changes and ventricular bigeminy on admission with periods of breakthrough new AF with RVR.  ? ?Interval History: ?-Back in A-fib with RVR with rate peaking in the 160s-170s at 0941, improved somewhat to the 120s with IV metoprolol x1 ?-Patient is spontaneously opening his eyes and tracking.  SBT trial again today. ? ? ?Vitals:  ? 02/04/22 0800 02/04/22 0808 02/04/22 0900 02/04/22 1102  ?BP: 129/64  138/65 134/85  ?Pulse: (!) 41  (!) 39   ?Resp: (!) 26  (!) 31   ?Temp:   (!) 100.4 ?F (38 ?C)   ?TempSrc:   Bladder   ?SpO2: 97% 98% 97%   ?Weight:      ?Height:      ? ? ? ?Intake/Output Summary (Last 24 hours) at 02/04/2022 1239 ?Last data filed at 02/04/2022 1021 ?Gross per 24 hour  ?Intake 3433.23 ml  ?Output 4840 ml  ?Net -1406.77 ml  ? ? ? ?PHYSICAL EXAM ?General: Acutely ill-appearing middle-aged Caucasian male, intubated in the ICU  ?HEENT:  Normocephalic and atraumatic. ?Neck:   No JVD.  ?Lungs: Intubated. Air movement somewhat improving, coarse breath sounds and without wheezes  ?Heart: Tachy irregularly irregular rhythm.  Normal S1 and S2 without gallops or murmurs. Radial & DP pulses 2+ bilaterally. ?Abdomen: Obese appearing ?Msk: Normal tone for age. ?Extremities: Warm and well perfused. No clubbing, cyanosis.  Trace lower extremity edema bilaterally. ?Neuro: Spontaneously opens his eyes and tracks me in the room moves his head from side to side. ?Psych: Unable to assess  ? ? ?LABS: ?Basic Metabolic Panel: ?Recent Labs  ?  02/03/22 ?0600 02/04/22 ?0430  ?NA 137 147*  ?K 3.9 3.6  ?CL 94* 103  ?CO2 37* 39*  ?GLUCOSE 125* 148*  ?BUN 42* 48*  ?CREATININE 0.61 0.78  ?CALCIUM 7.9* 8.3*  ?MG 2.6* 2.1  ?PHOS 2.3* 2.6  ? ? ?Liver Function Tests: ?No results for input(s): AST, ALT, ALKPHOS, BILITOT, PROT, ALBUMIN in the last 72 hours. ? ?No results for input(s): LIPASE, AMYLASE in the last 72 hours. ?CBC: ?Recent Labs  ?  02/03/22 ?0600 02/04/22 ?0430  ?WBC 9.7 12.3*  ?NEUTROABS  --  9.8*  ?HGB 14.0 14.2  ?HCT 42.6 44.6  ?MCV 98.2 98.0  ?PLT 170 198  ? ? ?Cardiac Enzymes: ?No results for input(s): CKTOTAL, CKMB, CKMBINDEX, TROPONINI in the last 72 hours. ?BNP: ?Invalid input(s): POCBNP ?D-Dimer: ?No results for input(s): DDIMER in the last 72 hours. ?Hemoglobin A1C: ?No results for input(s): HGBA1C in the last 72 hours. ?Fasting Lipid Panel: ?Recent Labs  ?  02/03/22 ?1459  ?TRIG 110  ? ? ?  Thyroid Function Tests: ?No results for input(s): TSH, T4TOTAL, T3FREE, THYROIDAB in the last 72 hours. ? ?Invalid input(s): FREET3 ? ?Anemia Panel: ?No results for input(s): VITAMINB12, FOLATE, FERRITIN, TIBC, IRON, RETICCTPCT in the last 72 hours. ? ?No results found. ? ? ?Echo LVEF 60-65% ? ?TELEMETRY: sinus rhythm with ventricular bigeminy, rate 100-120 ? ?ASSESSMENT AND PLAN: ? ?Principal Problem: ?  Asthma exacerbation ?Active Problems: ?  COPD exacerbation (HCC) ?  Sepsis (HCC) ?  Tobacco abuse ?  Acute respiratory failure with hypoxia and hypercapnia (HCC) ?  Acute on chronic respiratory failure with hypercapnia (HCC) ?  Arterial line in place ?   ? ?1. Atrial fibrillation with RVR, compensatory for underlying respiratory failure, on Lovenox for stroke prevention. In NSR 3/9.  Back in AF with RVR rates peaking at 170, IV metoprolol x1 with rate down to 120. ?2.  Frequent PVCs of uncertain clinical significance, normal left ventricular function ?3.  Respiratory failure / asthma / COPD exacerbation / severe ARDS / pansinusitis intubated, SBT again today.  Only on propofol for sedation. ? ?Recommendations ?  ?1.  Agree with current therapy per PCCM ?2.  received roughly 9g of amiodarone IV since admission, continue 200mg  BID per tube BID and continue  diltiazem per tube 90mg  q6h. PRN IV metoprolol.  ?3.  We will see how patient does after SBT and make further recommendations thereafter. ?4.  Continue Lovenox for stroke prevention ?5.  Agree with lasix 40mg  BID for volume overload    ?6.  Defer further cardiac diagnostics at this time ? ?This patient's plan of care was discussed and created with Dr. and he is in agreement.   ? ? , PA-C ?02/04/2022 ?12:39 PM ? ? ? ?  ?

## 2022-02-05 LAB — BASIC METABOLIC PANEL
Anion gap: 7 (ref 5–15)
BUN: 42 mg/dL — ABNORMAL HIGH (ref 6–20)
CO2: 35 mmol/L — ABNORMAL HIGH (ref 22–32)
Calcium: 8.4 mg/dL — ABNORMAL LOW (ref 8.9–10.3)
Chloride: 103 mmol/L (ref 98–111)
Creatinine, Ser: 0.57 mg/dL — ABNORMAL LOW (ref 0.61–1.24)
GFR, Estimated: >60 mL/min (ref >60)
Glucose, Bld: 133 mg/dL — ABNORMAL HIGH (ref 70–99)
Potassium: 3.6 mmol/L (ref 3.5–5.1)
Sodium: 145 mmol/L (ref 135–145)

## 2022-02-05 LAB — CBC WITH DIFFERENTIAL/PLATELET
Abs Immature Granulocytes: 0.15 10*3/uL — ABNORMAL HIGH (ref 0.00–0.07)
Basophils Absolute: 0 10*3/uL (ref 0.0–0.1)
Basophils Relative: 0 %
Eosinophils Absolute: 0.1 10*3/uL (ref 0.0–0.5)
Eosinophils Relative: 1 %
HCT: 44.6 % (ref 39.0–52.0)
Hemoglobin: 13.8 g/dL (ref 13.0–17.0)
Immature Granulocytes: 2 %
Lymphocytes Relative: 15 %
Lymphs Abs: 1.5 10*3/uL (ref 0.7–4.0)
MCH: 29.9 pg (ref 26.0–34.0)
MCHC: 30.9 g/dL (ref 30.0–36.0)
MCV: 96.7 fL (ref 80.0–100.0)
Monocytes Absolute: 0.6 10*3/uL (ref 0.1–1.0)
Monocytes Relative: 6 %
Neutro Abs: 7.7 10*3/uL (ref 1.7–7.7)
Neutrophils Relative %: 76 %
Platelets: 205 10*3/uL (ref 150–400)
RBC: 4.61 MIL/uL (ref 4.22–5.81)
RDW: 14.5 % (ref 11.5–15.5)
WBC: 10.1 10*3/uL (ref 4.0–10.5)
nRBC: 0 % (ref 0.0–0.2)

## 2022-02-05 LAB — GLUCOSE, CAPILLARY
Glucose-Capillary: 113 mg/dL — ABNORMAL HIGH (ref 70–99)
Glucose-Capillary: 125 mg/dL — ABNORMAL HIGH (ref 70–99)
Glucose-Capillary: 126 mg/dL — ABNORMAL HIGH (ref 70–99)
Glucose-Capillary: 128 mg/dL — ABNORMAL HIGH (ref 70–99)
Glucose-Capillary: 133 mg/dL — ABNORMAL HIGH (ref 70–99)
Glucose-Capillary: 166 mg/dL — ABNORMAL HIGH (ref 70–99)
Glucose-Capillary: 91 mg/dL (ref 70–99)

## 2022-02-05 LAB — MAGNESIUM: Magnesium: 2.1 mg/dL (ref 1.7–2.4)

## 2022-02-05 LAB — PHOSPHORUS: Phosphorus: 2.6 mg/dL (ref 2.5–4.6)

## 2022-02-05 MED ORDER — METOPROLOL TARTRATE 25 MG PO TABS
12.5000 mg | ORAL_TABLET | Freq: Two times a day (BID) | ORAL | Status: DC
  Administered 2022-02-05: 12.5 mg
  Filled 2022-02-05: qty 1

## 2022-02-05 MED ORDER — POTASSIUM CHLORIDE 20 MEQ PO PACK
40.0000 meq | PACK | Freq: Once | ORAL | Status: AC
  Administered 2022-02-05: 40 meq
  Filled 2022-02-05: qty 2

## 2022-02-05 MED ORDER — DOCUSATE SODIUM 50 MG/5ML PO LIQD
100.0000 mg | Freq: Two times a day (BID) | ORAL | Status: DC | PRN
  Filled 2022-02-05: qty 10

## 2022-02-05 MED ORDER — NUTRISOURCE FIBER PO PACK
1.0000 | PACK | Freq: Two times a day (BID) | ORAL | Status: DC
  Administered 2022-02-06 (×2): 1 via ORAL
  Filled 2022-02-05 (×4): qty 1

## 2022-02-05 MED ORDER — METOPROLOL TARTRATE 25 MG PO TABS: 12.5000 mg | ORAL_TABLET | Freq: Two times a day (BID) | ORAL | Status: DC

## 2022-02-05 MED ORDER — METOPROLOL TARTRATE 25 MG PO TABS
12.5000 mg | ORAL_TABLET | Freq: Two times a day (BID) | ORAL | Status: DC
  Administered 2022-02-05 – 2022-02-06 (×2): 12.5 mg via ORAL
  Filled 2022-02-05 (×2): qty 1

## 2022-02-05 MED ORDER — IPRATROPIUM-ALBUTEROL 0.5-2.5 (3) MG/3ML IN SOLN
3.0000 mL | Freq: Three times a day (TID) | RESPIRATORY_TRACT | Status: DC
  Administered 2022-02-05 – 2022-02-08 (×8): 3 mL via RESPIRATORY_TRACT
  Filled 2022-02-05 (×8): qty 3

## 2022-02-05 MED ORDER — NYSTATIN 100000 UNIT/ML MT SUSP
5.0000 mL | Freq: Four times a day (QID) | OROMUCOSAL | Status: DC
  Administered 2022-02-05 – 2022-02-13 (×32): 500000 [IU] via OROMUCOSAL
  Filled 2022-02-05 (×31): qty 5

## 2022-02-05 MED ORDER — ACETAMINOPHEN 160 MG/5ML PO SOLN
650.0000 mg | Freq: Four times a day (QID) | ORAL | Status: DC | PRN
  Filled 2022-02-05: qty 20.3

## 2022-02-05 MED ORDER — FLUCONAZOLE 100MG IVPB
100.0000 mg | INTRAVENOUS | Status: DC
  Administered 2022-02-05 – 2022-02-07 (×3): 100 mg via INTRAVENOUS
  Filled 2022-02-05 (×5): qty 50

## 2022-02-05 MED ORDER — DILTIAZEM HCL 30 MG PO TABS
90.0000 mg | ORAL_TABLET | Freq: Four times a day (QID) | ORAL | Status: DC
  Administered 2022-02-06 – 2022-02-08 (×9): 90 mg via ORAL
  Filled 2022-02-05 (×10): qty 3

## 2022-02-05 MED ORDER — AMIODARONE HCL 200 MG PO TABS
200.0000 mg | ORAL_TABLET | Freq: Two times a day (BID) | ORAL | Status: DC
  Administered 2022-02-05 – 2022-02-13 (×15): 200 mg via ORAL
  Filled 2022-02-05 (×16): qty 1

## 2022-02-05 MED ORDER — PREDNISONE 50 MG PO TABS
50.0000 mg | ORAL_TABLET | Freq: Every day | ORAL | Status: DC
  Administered 2022-02-06 – 2022-02-07 (×2): 50 mg via ORAL
  Filled 2022-02-05 (×2): qty 1

## 2022-02-05 MED ORDER — NYSTATIN 100000 UNIT/ML MT SUSP
5.0000 mL | Freq: Four times a day (QID) | OROMUCOSAL | Status: DC
  Filled 2022-02-05 (×3): qty 5

## 2022-02-05 NOTE — Consult Note (Signed)
PHARMACY CONSULT NOTE ? ?Pharmacy Consult for Electrolyte Monitoring and Replacement  ? ?Recent Labs: ?Potassium (mmol/L)  ?Date Value  ?02/05/2022 3.6  ? ?Magnesium (mg/dL)  ?Date Value  ?02/05/2022 2.1  ? ?Calcium (mg/dL)  ?Date Value  ?02/05/2022 8.4 (L)  ? ?Albumin (g/dL)  ?Date Value  ?01/29/2022 2.8 (L)  ? ?Phosphorus (mg/dL)  ?Date Value  ?02/05/2022 2.6  ? ?Sodium (mmol/L)  ?Date Value  ?02/05/2022 145  ? ?Corrected Ca: 8.76 mg/dL ? ?Assessment: ?Patient is a 61 y/o M with medical history including COPD / asthma and tobacco abuse who presented to the ED 2/24 with acute respiratory failure secondary to asthma / COPD exacerbation. Patient ultimately required intubation. Disposition is the ICU. Pharmacy consulted to assist with electrolyte monitoring and replacement as indicated. On amiodarone.  ? ?Nutrition:  ?Vital HP at 60>44ml/hr + ProSource TF 54ml TID via tube  ?Free water flushes 30>75mL q4 hours ? ?Diuretics: ?Lasix stopped.  ? ?Goal of Therapy:  ?Potassium 4.0 - 5.1 mmol/L ?Magnesium 2.0 - 2.4 mg/dL ?All Other Electrolytes within normal limits ? ?Plan:  ?Kcl 40 mEq x 1.  ?F/u with AM labs.  ? ?Eleonore Chiquito, PharmD, BCPS ?Clinical Pharmacist ?02/05/2022 9:19 AM ? ? ? ? ?

## 2022-02-05 NOTE — Progress Notes (Signed)
Pt extubated and placed on on 2lpm Cecilia, no complications noted, no stridor, great strong dry cough. Sats 99%, respiratory rate 20/min.  ?

## 2022-02-05 NOTE — Evaluation (Signed)
Physical Therapy Evaluation ?Patient Details ?Name: Kenneth Benson ?MRN: [id number] ?DOB: 1960/12/20 ?Today's Date: 02/05/2022 ? ?History of Present Illness ? Pt admitted for asthma exacerbation with complaints of SOB and cough. Pt with complicated hospital stay with intubation 2/24-3/11. Consult received this date for evaluation.  ?Clinical Impression ? Pt is a pleasant 61 year old male who was admitted for asthma exacerbation. Pt performs bed mobility with total assist. All mobility performed on 3L of O2 with sats WNL. Pt demonstrates deficits with strength/mobility. Pt is confused/poor historian, however reports he was indep prior to admission. Profound weakness noted from lengthy intubation. Would benefit from skilled PT to address above deficits and promote optimal return to PLOF; recommend transition to STR upon discharge from acute hospitalization. ? ?   ? ?Recommendations for follow up therapy are one component of a multi-disciplinary discharge planning process, led by the attending physician.  Recommendations may be updated based on patient status, additional functional criteria and insurance authorization. ? ?Follow Up Recommendations Skilled nursing-short term rehab (<3 hours/day) ? ?  ?Assistance Recommended at Discharge Frequent or constant Supervision/Assistance  ?Patient can return home with the following ? Two people to help with walking and/or transfers;Two people to help with bathing/dressing/bathroom ? ?  ?Equipment Recommendations  (TBD)  ?Recommendations for Other Services ?    ?  ?Functional Status Assessment Patient has had a recent decline in their functional status and demonstrates the ability to make significant improvements in function in a reasonable and predictable amount of time.  ? ?  ?Precautions / Restrictions Precautions ?Precautions: Fall ?Restrictions ?Weight Bearing Restrictions: No  ? ?  ? ?Mobility ? Bed Mobility ?Overal bed mobility: Needs Assistance ?Bed Mobility:  Rolling ?Rolling: Total assist ?  ?  ?  ?  ?General bed mobility comments: able to slightly initiate with B LE, however needs total assist for trunkal rotation and B UEs ?  ? ?Transfers ?  ?  ?  ?  ?  ?  ?  ?  ?  ?General transfer comment: unable to perform ?  ? ?Ambulation/Gait ?  ?  ?  ?  ?  ?  ?  ?General Gait Details: unable ? ?Stairs ?  ?  ?  ?  ?  ? ?Wheelchair Mobility ?  ? ?Modified Rankin (Stroke Patients Only) ?  ? ?  ? ?Balance   ?  ?  ?  ?  ?  ?  ?  ?  ?  ?  ?  ?  ?  ?  ?  ?  ?  ?  ?   ? ? ? ?Pertinent Vitals/Pain Pain Assessment ?Pain Assessment: No/denies pain  ? ? ?Home Living Family/patient expects to be discharged to:: Private residence ?Living Arrangements: Alone ?Available Help at Discharge: Friend(s);Available PRN/intermittently ?Type of Home: Mobile home ?Home Access: Stairs to enter ?Entrance Stairs-Rails: None ?Entrance Stairs-Number of Steps: 1 ?  ?Home Layout: One level ?Home Equipment: None ?Additional Comments: Pt is confused and is poor historian.  ?  ?Prior Function Prior Level of Function : Independent/Modified Independent ?  ?  ?  ?  ?  ?  ?Mobility Comments: indep prior; pt is poor historian, unsure of accurate responses ?  ?  ? ? ?Hand Dominance  ?   ? ?  ?Extremity/Trunk Assessment  ? Upper Extremity Assessment ?Upper Extremity Assessment: Generalized weakness (B UE grossly 2/5) ?  ? ?Lower Extremity Assessment ?Lower Extremity Assessment: Generalized weakness (B LE grossly 3/5) ?  ? ?   ?  Communication  ? Communication: No difficulties  ?Cognition Arousal/Alertness: Awake/alert ?Behavior During Therapy: [facility] for tasks assessed/performed ?Overall Cognitive Status: Impaired/Different from baseline ?  ?  ?  ?  ?  ?  ?  ?  ?  ?  ?  ?  ?  ?  ?  ?  ?General Comments: Pt is only alert to self. Pleasant and able to follow commands ?  ?  ? ?  ?General Comments   ? ?  ?Exercises Other Exercises ?Other Exercises: supine ther-ex performed on B LE/UE including upward punches, grip squeezes, and  hand claps. Also, SLRs, hip abd/add, glut sets, and quad sets. 10 reps performed with mod assist  ? ?Assessment/Plan  ?  ?PT Assessment Patient needs continued PT services  ?PT Problem List Decreased cognition;Decreased mobility;Decreased balance;Decreased strength ? ?   ?  ?PT Treatment Interventions Gait training;DME instruction;Therapeutic exercise;Balance training   ? ?PT Goals (Current goals can be found in the Care Plan section)  ?Acute Rehab PT Goals ?Patient Stated Goal: to get stronger and feed himself ?PT Goal Formulation: With patient ?Time For Goal Achievement: 02/19/22 ?Potential to Achieve Goals: Fair ? ?  ?Frequency Min 2X/week ?  ? ? ?Co-evaluation   ?  ?  ?  ?  ? ? ?  ?AM-PAC PT "6 Clicks" Mobility  ?Outcome Measure Help needed turning from your back to your side while in a flat bed without using bedrails?: Total ?Help needed moving from lying on your back to sitting on the side of a flat bed without using bedrails?: Total ?Help needed moving to and from a bed to a chair (including a wheelchair)?: Total ?Help needed standing up from a chair using your arms (e.g., wheelchair or bedside chair)?: Total ?Help needed to walk in hospital room?: Total ?Help needed climbing 3-5 steps with a railing? : Total ?6 Click Score: 6 ? ?  ?End of Session Equipment Utilized During Treatment: Oxygen ?Activity Tolerance: Patient tolerated treatment well ?Patient left: in bed;with bed alarm set ?Nurse Communication: Mobility status ?PT Visit Diagnosis: Muscle weakness (generalized) (M62.81);Difficulty in walking, not elsewhere classified (R26.2) ?  ? ?Time: 2355-7322 ?PT Time Calculation (min) (ACUTE ONLY): 18 min ? ? ?Charges:   PT Evaluation ?$PT Eval Low Complexity: 1 Low ?PT Treatments ?$Therapeutic Exercise: 8-22 mins ?  ?   ? ? ?Elizabeth Palau, PT, DPT, GCS ?[phone] ? ? ?Kenneth Benson ?02/05/2022, 3:39 PM ? ?

## 2022-02-05 NOTE — Progress Notes (Signed)
Pt extubated this am. Will plan for ST swallow eval tomorrow. 02/06/22 ?

## 2022-02-05 NOTE — Progress Notes (Signed)
[facility] Cardiology ? ?Patient ID: ?Kenneth Benson ?MRN: [id number] ?DOB/AGE: June 04, 1961 61 y.o. ?  ?Admit date: 01/21/2022 ?Referring Physician Dr. Donna Bernard ?Primary Physician  ?Primary Cardiologist  ?Reason for Consultation EKG changes ?  ?HPI: The patient is a 61 year old male with a past medical history notable for asthma, COPD, and current tobacco use who presented to [facility] ED in the early morning hours complaining of shortness of breath with worsening cough and congestion.  Per EMS reporting the patient's O2 sats were in the 60s and he was given 2 DuoNebs and Solu-Medrol on route.  He presented to the ED in moderate respiratory distress with diffuse wheezing and had chest tightness without chest pain.  He was given DuoNebs x3 and 2 g of IV mag and was subsequently placed on BiPAP and eventually required intubation due to worsening respiratory status.  Cardiology was consulted because of ventricular bigeminy, and then now persistent new onset atrial fibrillation.  He remains intubated on the ventilator. ? ?Interval History: ?- Extubated this morning.  ?- Back in NSR with frequent PAC and PVC ?- No complaints. Wants water.  ? ? ?Vitals:  ? 02/05/22 0726 02/05/22 0732 02/05/22 0810 02/05/22 0849  ?BP:    122/69  ?Pulse:    100  ?Resp:      ?Temp:      ?TempSrc:      ?SpO2: 99% 96% 98%   ?Weight:      ?Height:      ? ? ? ?Intake/Output Summary (Last 24 hours) at 02/05/2022 0854 ?Last data filed at 02/05/2022 2458 ?Gross per 24 hour  ?Intake 1708.95 ml  ?Output 1505 ml  ?Net 203.95 ml  ? ? ? ?PHYSICAL EXAM ?General: Acutely ill-appearing middle-aged Caucasian male, intubated in the ICU  ?HEENT:  Normocephalic and atraumatic. ?Neck:   No JVD.  ?Lungs: Intubated. Air movement somewhat improving, coarse breath sounds and without wheezes  ?Heart: Tachy irregularly irregular rhythm. Normal S1 and S2 without gallops or murmurs. Radial & DP pulses 2+ bilaterally. ?Abdomen: Obese appearing ?Msk: Normal tone for age. ?Extremities:  Warm and well perfused. No clubbing, cyanosis.  Trace lower extremity edema bilaterally. ?Neuro: Spontaneously opens his eyes and tracks me in the room moves his head from side to side. ?Psych: Unable to assess  ? ? ?LABS: ?Basic Metabolic Panel: ?Recent Labs  ?  02/04/22 ?0430 02/05/22 ?0505  ?NA 147* 145  ?K 3.6 3.6  ?CL 103 103  ?CO2 39* 35*  ?GLUCOSE 148* 133*  ?BUN 48* 42*  ?CREATININE 0.78 0.57*  ?CALCIUM 8.3* 8.4*  ?MG 2.1 2.1  ?PHOS 2.6 2.6  ? ? ?Liver Function Tests: ?No results for input(s): AST, ALT, ALKPHOS, BILITOT, PROT, ALBUMIN in the last 72 hours. ? ?No results for input(s): LIPASE, AMYLASE in the last 72 hours. ?CBC: ?Recent Labs  ?  02/04/22 ?0430 02/05/22 ?0505  ?WBC 12.3* 10.1  ?NEUTROABS 9.8* 7.7  ?HGB 14.2 13.8  ?HCT 44.6 44.6  ?MCV 98.0 96.7  ?PLT 198 205  ? ? ?Cardiac Enzymes: ?No results for input(s): CKTOTAL, CKMB, CKMBINDEX, TROPONINI in the last 72 hours. ?BNP: ?Invalid input(s): POCBNP ?D-Dimer: ?No results for input(s): DDIMER in the last 72 hours. ?Hemoglobin A1C: ?No results for input(s): HGBA1C in the last 72 hours. ?Fasting Lipid Panel: ?Recent Labs  ?  02/03/22 ?1459  ?TRIG 110  ? ? ?Thyroid Function Tests: ?No results for input(s): TSH, T4TOTAL, T3FREE, THYROIDAB in the last 72 hours. ? ?Invalid input(s): FREET3 ? ?Anemia Panel: ?No  results for input(s): VITAMINB12, FOLATE, FERRITIN, TIBC, IRON, RETICCTPCT in the last 72 hours. ? ?No results found. ? ? ?Echo LVEF 60-65% ? ?TELEMETRY: sinus rhythm with ventricular bigeminy, rate 100-120 ? ?ASSESSMENT AND PLAN: ? ?Principal Problem: ?  Asthma exacerbation ?Active Problems: ?  COPD exacerbation (HCC) ?  Sepsis (HCC) ?  Tobacco abuse ?  Acute respiratory failure with hypoxia and hypercapnia (HCC) ?  Acute on chronic respiratory failure with hypercapnia (HCC) ?  Arterial line in place ?  ? ?1. Atrial fibrillation with RVR, compensatory for underlying respiratory failure, on Lovenox for stroke prevention. In NSR 3/9, but recurrent on  3/10 ?2.  Frequent PVCs of uncertain clinical significance, normal left ventricular function ?3.  Respiratory failure / asthma / COPD exacerbation / severe ARDS / pansinusitis extubated 02/05/22. Still wheezing on exam. ? ?Recommendations ?  ?1.  Agree with current therapy per PCCM ?2.  received roughly 9g of amiodarone IV since admission, continue 200mg  BID per tube BID continue  diltiazem 90mg  q6h. metoprolol 12.5 twice daily; titrate as needed. ?3.  Continue Lovenox for stroke prevention ?4.  Hold on lasix today. None given since 02/03/22.  ?5.  Defer further cardiac diagnostics at this time ? ? ? , MD ?02/05/2022 ?8:54 AM ? ? ? ?  ?

## 2022-02-05 NOTE — Progress Notes (Signed)
NAME:  Kenneth Benson, MRN:  [id number], DOB:  11-06-1961, LOS: 61 ADMISSION DATE:  01/21/2022, CONSULTATION DATE: 01/21/2022 REFERRING MD: Dr. Cheri Fowler, CHIEF COMPLAINT: Shortness of Breath    History of Present Illness:  This is a 61 y.o. male who presented to [facility] ER via EMS on 02/24 with c/o shortness of breath, cough, wheezing, and congestion onset of symptoms several days prior to presentation. He reported using his inhaler without relief of symptoms.  EMS reported pt severely hypoxic with O2 sats in the 60's.  He received duoneb's x2 and 125 mg of iv solumedrol en route to the ER.  All hx obtained from chart review pt currently sedated and mechanically intubated with no family at bedside.   ED Course Upon arrival to the ER pt noted to be in moderate respiratory distress with diffuse wheezes per ED provider notes.  CXR, COVID-19/Influenza A&B, and pct negative.  VBG revealed pH 7.17/pCO2 99/bicarb 36.1.  EKG revealed sinus tachycardia hr 140 without ST elevation/depression and troponin 9.  He received duoneb x3 and 2 g of iv magnesium.  He was subsequently placed on Bipap with initial improvement of respiratory status.  However, respiratory status rapidly declined and pt required mechanical intubation.  He received an additional 2 g of iv magnesium and duoneb post intubation.  PCCM team contacted for ICU admission.  02/01/22- patient remains tachyarrythymic s/p amiodarone IV without response, reviewed with cardiology team.  Will switch sedation to precedex. Today. I spoke with GF [name] today and she was able to provide to [place] patients Sister contact information which has been uploaded to her chart. 02/02/22- patient improved with reduced FIO2 from 55 to 40. ENT consult for possible trache.Sister of patietn was finally located and she will come in to see patient tonight.  02/03/22- patient is weaned off sedation and tried SBT with failure after <40mn on 5/5 and we are planning to try again today.   02/04/22- patient for repeat SBT today.  02/05/22- patient liberated from MV and is lucid speaking in full sentences.  Severe Oral thrush on examination. Plan to optimize for TRH transfer.   Pertinent  Medical History  COPD Current Smoker  Asthma   Significant Hospital Events: Including procedures, antibiotic start and stop dates in addition to other pertinent events   02/24: Pt admitted to ICU with acute on chronic hypercapnic hypoxic respiratory failure secondary to asthma and COPD exacerbation requiring mechanical intubation  02/25: Remains intubated, vent changes allowed 02/27: Remains intubated, slowly weaning vent support. Respiratory culture with normal respiratory flora 03/01: Cardizem and Heparin gtts initiated by Cardiology for AF. Unable to tolerate decrease in sedation due to Tachycardia and hypoxia. 03/02: Sedation lightened for WUA, delirious but able to follow simple commands.  CXR concerning for possible pulmonary edema, diuresing with Diamox + Metolazone.  Heparin gtt changed to treatment dose Lovenox 03/4: Pt severely hypoxic overnight with elevated peak pressures requiring following ventilator changes: FiO2 90% and PEEP 10. ETT exchanged.  Amiodarone bolus followed by drip started due to atrial fibrillation with rvr 3/5: continued difficulty achieving rate control; remains on high vent settings 01/31/22- reduced sedation with dc of fentanyl and minimal propofol with initiation of ketamine due to status asthmaticus in context of mixed viral and bacterial LRTI    Objective   Blood pressure 122/69, pulse 100, temperature 98.7 F (37.1 C), temperature source Bladder, resp. rate (!) 30, height 6' (1.829 m), weight 98.6 kg, SpO2 98 %.    Vent Mode: Spontaneous  FiO2 (%):  [30 %-40 %] 30 % Set Rate:  [18 bmp] 18 bmp Vt Set:  [430 mL] 430 mL PEEP:  [5 cmH20] 5 cmH20 Pressure Support:  [8 cmH20-10 cmH20] 8 cmH20 Plateau Pressure:  [13 cmH20] 13 cmH20   During rounds was able  to switch patient to PRVC: Vent Mode: Spontaneous FiO2 (%):  [30 %-40 %] 30 % Set Rate:  [18 bmp] 18 bmp Vt Set:  [430 mL] 430 mL PEEP:  [5 cmH20] 5 cmH20 Pressure Support:  [8 cmH20-10 cmH20] 8 cmH20 Plateau Pressure:  [13 cmH20] 13 cmH20   Intake/Output Summary (Last 24 hours) at 02/05/2022 0904 Last data filed at 02/05/2022 2707 Gross per 24 hour  Intake 1591.47 ml  Output 1505 ml  Net 86.47 ml    Filed Weights   01/28/22 0456 01/31/22 0500 02/03/22 0825  Weight: 102.2 kg 103 kg 98.6 kg   Examination: General: age appropriate NDA Lungs: rhonchi b/l Cardiovascular: irregularly irregular, no M/R/G, 2+ radial/2+ distal pulses, 1+ pitting edema of BLE Abdomen: +BS x4, obese, soft, non tender, non distended  Neuro: GCS 12 with decreased ROM muscle strength 1/4x4 GU: indwelling foley draining clear yellow urine   Resolved Hospital Problem list   Acute kidney injury likely due to ATN Mild hyponatremia, hypochloremia  Assessment & Plan:   Acute on chronic hypoxic hypercapnic respiratory failure Status asthmaticus Rhinovirus bronchiolitis Concern for possible VAP~respiratory culture 03/3 moderate gram+ cocci in pair and moderate gram negative rods - Metapneumovirus NOT DETECTED NOT DETECTED   Rhinovirus / Enterovirus NOT DETECTED DETECTED Abnormal    Influenza A NOT DETECTED NOT DETECTED    0 Result Notes    Component 3 d ago  Specimen Description TRACHEAL ASPIRATE  Performed at [facility], [address] [place], [place] [address]   Special Requests NONE  Performed at [facility], [address] [place], [place] [address]   Gram Stain FEW WBC PRESENT, PREDOMINANTLY MONONUCLEAR  MODERATE GRAM NEGATIVE RODS  FEW GRAM POSITIVE COCCI IN PAIRS   Culture ABUNDANT HAEMOPHILUS INFLUENZAE  BETA LACTAMASE NEGATIVE  Performed at [facility], [place] [address], [place], [place] [address]   Report Status 01/31/2022 FINAL      Cardiogenic pulmonary  edema Recurrent atrial fibrillation with rvr Intermittent ventricular bigeminy with frequent PVC's  TTE 01/21/22:  Left ventricular ejection fraction, by estimation, is 60  to 65%. The left ventricle has normal function. The left ventricle has no  regional wall motion abnormalities. The left ventricular internal cavity  size was mildly dilated. There is  borderline concentric left ventricular hypertrophy. The interventricular  septum is flattened in diastole ('D' shaped left ventricle), consistent with right ventricular volume overload. Left ventricular diastolic parameters were normal.  -Continuous telemetry monitoring  -Continue amiodarone gtt; re-bolus today -Start scheduled Cardizem PO -Awaiting formal read of bubble study -Diurese for goal 1L negative today -Replace electrolytes as indicated  -Monitor UOP  -Continue therapeutic lovenox -Cardiology consulted appreciate input   Constipation -Continue current bowel regimen   Sedation needs in the setting of mechanical ventilation -Maintain a RASS goal of -4 to -5 due to worsening hypoxia  -Fentanyl and Propofol gtts along with prn vecuronium as needed to maintain RASS goal  Hyperglycemia, likely due to critical illness and steroids Hgb A1c 4.9 -CBG's q4h; Target range of 140 to 180 -SSI -Follow ICU Hypo/Hyperglycemia protocol  Best Practice (right click and "Reselect all SmartList Selections" daily)   Diet/type: NPO, tube feeds DVT  prophylaxis: Therapeutic Lovenox  GI prophylaxis: PPI Lines: Central line and Arterial Line; will d/c CVC today and place PICC Foley:  Yes, and it is still needed Code Status:  full code Last date of multidisciplinary goals of care discussion [01/27/2022]  Significant other [name] Hostetter is only current contact. Need further clarification on status of additional family members to determine decision-maker. Labs   CBC: Recent Labs  Lab 01/30/22 0531 01/31/22 0500 02/01/22 0500 02/02/22 0636  02/03/22 0600 02/04/22 0430 02/05/22 0505  WBC 8.7   < > 11.3* 8.0 9.7 12.3* 10.1  NEUTROABS 7.5  --   --   --   --  9.8* 7.7  HGB 12.5*   < > 13.2 13.2 14.0 14.2 13.8  HCT 40.6   < > 41.3 41.6 42.6 44.6 44.6  MCV 96.9   < > 96.7 96.3 98.2 98.0 96.7  PLT 150   < > 202 164 170 198 205   < > = values in this interval not displayed.     Basic Metabolic Panel: Recent Labs  Lab 02/01/22 0500 02/02/22 0636 02/03/22 0600 02/04/22 0430 02/05/22 0505  NA 142 148* 137 147* 145  K 3.7 4.0 3.9 3.6 3.6  CL 95* 103 94* 103 103  CO2 37* 37* 37* 39* 35*  GLUCOSE 187* 124* 125* 148* 133*  BUN 52* 46* 42* 48* 42*  CREATININE 0.85 0.58* 0.61 0.78 0.57*  CALCIUM 7.7* 7.8* 7.9* 8.3* 8.4*  MG 2.4 2.2 2.6* 2.1 2.1  PHOS 2.4* 3.3 2.3* 2.6 2.6    GFR: Estimated Creatinine Clearance: 120.9 mL/min (A) (by C-G formula based on SCr of 0.57 mg/dL (L)). Recent Labs  Lab 02/02/22 0636 02/03/22 0600 02/04/22 0430 02/05/22 0505  WBC 8.0 9.7 12.3* 10.1     Liver Function Tests: No results for input(s): AST, ALT, ALKPHOS, BILITOT, PROT, ALBUMIN in the last 168 hours.  No results for input(s): LIPASE, AMYLASE in the last 168 hours. No results for input(s): AMMONIA in the last 168 hours.  ABG    Component Value Date/Time   PHART 7.51 (H) 02/04/2022 0500   PCO2ART 55 (H) 02/04/2022 0500   PO2ART 59 (L) 02/04/2022 0500   HCO3 43.9 (H) 02/04/2022 0500   O2SAT 92.9 02/04/2022 0500      Coagulation Profile: No results for input(s): INR, PROTIME in the last 168 hours.   Cardiac Enzymes: No results for input(s): CKTOTAL, CKMB, CKMBINDEX, TROPONINI in the last 168 hours.  HbA1C: Hgb A1c MFr Bld  Date/Time Value Ref Range Status  01/21/2022 02:36 PM 4.9 4.8 - 5.6 % Final    Comment:    (NOTE) Pre diabetes:          5.7%-6.4%  Diabetes:              >6.4%  Glycemic control for   <7.0% adults with diabetes     CBG: Recent Labs  Lab 02/04/22 1531 02/04/22 1952 02/04/22 2340  02/05/22 0336 02/05/22 0741  GLUCAP 151* 129* 118* 126* 125*     Review of Systems:   Unable to assess pt mechanically ventilated  Allergies No Known Allergies   Scheduled Meds:  amiodarone  200 mg Per Tube BID   arformoterol  15 mcg Nebulization BID   budesonide (PULMICORT) nebulizer solution  0.25 mg Nebulization BID   chlorhexidine gluconate (MEDLINE KIT)  15 mL Mouth Rinse BID   Chlorhexidine Gluconate Cloth  6 each Topical Q0600   diltiazem  90 mg Per Tube  Q6H   enoxaparin (LOVENOX) injection  1 mg/kg Subcutaneous BID   feeding supplement (PROSource TF)  90 mL Per Tube QID   fiber  1 packet Per Tube BID   free water  50 mL Per Tube Q4H   glycopyrrolate  0.2 mg Intravenous TID   insulin aspart  0-15 Units Subcutaneous Q4H   ipratropium-albuterol  3 mL Nebulization Q6H   mouth rinse  15 mL Mouth Rinse 10 times per day   metoprolol tartrate  12.5 mg Per Tube BID   pantoprazole (PROTONIX) IV  40 mg Intravenous Q24H   predniSONE  50 mg Per Tube Q breakfast   sodium chloride flush  10-40 mL Intracatheter Q12H   sodium chloride flush  10-40 mL Intracatheter Q12H   Continuous Infusions:  sodium chloride Stopped (01/25/22 1112)   feeding supplement (VITAL 1.5 CAL) 50 mL/hr at 02/05/22 0601   fentaNYL infusion INTRAVENOUS Stopped (01/31/22 1434)   propofol (DIPRIVAN) infusion 50 mcg/kg/min (02/05/22 0702)   PRN Meds:.acetaminophen (TYLENOL) oral liquid 160 mg/5 mL, albuterol, docusate, fentaNYL (SUBLIMAZE) injection, fentaNYL (SUBLIMAZE) injection, metoprolol tartrate, midazolam, ondansetron (ZOFRAN) IV, sodium chloride flush, sodium chloride flush, vecuronium   Critical care provider statement:   Total critical care time: 33 minutes   Performed by: Lanney Gins MD   Critical care time was exclusive of separately billable procedures and treating other patients.   Critical care was necessary to treat or prevent imminent or life-threatening deterioration.   Critical care  was time spent personally by me on the following activities: development of treatment plan with patient and/or surrogate as well as nursing, discussions with consultants, evaluation of patient's response to treatment, examination of patient, obtaining history from patient or surrogate, ordering and performing treatments and interventions, ordering and review of laboratory studies, ordering and review of radiographic studies, pulse oximetry and re-evaluation of patient's condition.    Ottie Glazier, M.D.  Pulmonary & Critical Care Medicine

## 2022-02-05 NOTE — Progress Notes (Signed)
OT Cancellation Note ? ?Patient Details ?Name: Kenneth Benson ?MRN: [id number] ?DOB: February 09, 1961 ? ? ?Cancelled Treatment:    Reason Eval/Treat Not Completed: Patient not medically ready. Consult received, chart reviewed. Pt intubated 2/24, extubated this morning, 3/11. Recent HR in mid 40's. Will hold OT evaluation at this time to ensure pt remains stable post-extubation and will initiate OT services next date as medically appropriate.  ? ?Ardeth Perfect., MPH, MS, OTR/L ?ascom [phone] ?02/05/22, 10:38 AM ? ?

## 2022-02-06 DIAGNOSIS — R1310 Dysphagia, unspecified: Secondary | ICD-10-CM

## 2022-02-06 DIAGNOSIS — I4891 Unspecified atrial fibrillation: Secondary | ICD-10-CM | POA: Diagnosis not present

## 2022-02-06 DIAGNOSIS — I480 Paroxysmal atrial fibrillation: Secondary | ICD-10-CM

## 2022-02-06 DIAGNOSIS — E663 Overweight: Secondary | ICD-10-CM | POA: Diagnosis present

## 2022-02-06 HISTORY — DX: Unspecified atrial fibrillation: I48.91

## 2022-02-06 LAB — CBC WITH DIFFERENTIAL/PLATELET
Abs Immature Granulocytes: 0.11 10*3/uL — ABNORMAL HIGH (ref 0.00–0.07)
Basophils Absolute: 0 10*3/uL (ref 0.0–0.1)
Basophils Relative: 0 %
Eosinophils Absolute: 0.1 10*3/uL (ref 0.0–0.5)
Eosinophils Relative: 1 %
HCT: 43.7 % (ref 39.0–52.0)
Hemoglobin: 13.8 g/dL (ref 13.0–17.0)
Immature Granulocytes: 1 %
Lymphocytes Relative: 12 %
Lymphs Abs: 1.3 10*3/uL (ref 0.7–4.0)
MCH: 29.9 pg (ref 26.0–34.0)
MCHC: 31.6 g/dL (ref 30.0–36.0)
MCV: 94.8 fL (ref 80.0–100.0)
Monocytes Absolute: 0.6 10*3/uL (ref 0.1–1.0)
Monocytes Relative: 5 %
Neutro Abs: 9 10*3/uL — ABNORMAL HIGH (ref 1.7–7.7)
Neutrophils Relative %: 81 %
Platelets: 232 10*3/uL (ref 150–400)
RBC: 4.61 MIL/uL (ref 4.22–5.81)
RDW: 13.8 % (ref 11.5–15.5)
WBC: 11.1 10*3/uL — ABNORMAL HIGH (ref 4.0–10.5)
nRBC: 0 % (ref 0.0–0.2)

## 2022-02-06 LAB — BASIC METABOLIC PANEL
Anion gap: 7 (ref 5–15)
BUN: 30 mg/dL — ABNORMAL HIGH (ref 6–20)
CO2: 27 mmol/L (ref 22–32)
Calcium: 8.1 mg/dL — ABNORMAL LOW (ref 8.9–10.3)
Chloride: 104 mmol/L (ref 98–111)
Creatinine, Ser: 0.45 mg/dL — ABNORMAL LOW (ref 0.61–1.24)
GFR, Estimated: >60 mL/min (ref >60)
Glucose, Bld: 112 mg/dL — ABNORMAL HIGH (ref 70–99)
Potassium: 3.6 mmol/L (ref 3.5–5.1)
Sodium: 138 mmol/L (ref 135–145)

## 2022-02-06 LAB — GLUCOSE, CAPILLARY
Glucose-Capillary: 126 mg/dL — ABNORMAL HIGH (ref 70–99)
Glucose-Capillary: 104 mg/dL — ABNORMAL HIGH (ref 70–99)
Glucose-Capillary: 119 mg/dL — ABNORMAL HIGH (ref 70–99)
Glucose-Capillary: 133 mg/dL — ABNORMAL HIGH (ref 70–99)
Glucose-Capillary: 114 mg/dL — ABNORMAL HIGH (ref 70–99)

## 2022-02-06 LAB — HIV ANTIBODY (ROUTINE TESTING W REFLEX): HIV Screen 4th Generation wRfx: NONREACTIVE

## 2022-02-06 LAB — MAGNESIUM: Magnesium: 1.8 mg/dL (ref 1.7–2.4)

## 2022-02-06 LAB — PHOSPHORUS: Phosphorus: 1.9 mg/dL — ABNORMAL LOW (ref 2.5–4.6)

## 2022-02-06 MED ORDER — POTASSIUM PHOSPHATES 15 MMOLE/5ML IV SOLN
30.0000 mmol | Freq: Once | INTRAVENOUS | Status: AC
  Administered 2022-02-06: 30 mmol via INTRAVENOUS
  Filled 2022-02-06: qty 10

## 2022-02-06 MED ORDER — METOPROLOL TARTRATE 25 MG PO TABS
12.5000 mg | ORAL_TABLET | Freq: Four times a day (QID) | ORAL | Status: DC
Start: 2022-02-06 — End: 2022-02-08
  Administered 2022-02-06 – 2022-02-08 (×6): 12.5 mg via ORAL
  Filled 2022-02-06 (×7): qty 1

## 2022-02-06 MED ORDER — MAGNESIUM SULFATE 2 GM/50ML IV SOLN
2.0000 g | Freq: Once | INTRAVENOUS | Status: AC
  Administered 2022-02-06: 2 g via INTRAVENOUS
  Filled 2022-02-06: qty 50

## 2022-02-06 NOTE — Assessment & Plan Note (Addendum)
New onset.  Being seen by cardiology.  Goes in and out.  On Cardizem and amiodarone and Lovenox for stroke prevention.  Given some Lasix earlier.  Echocardiogram from 3/4 notes no atrial enlargement.  Heart rate overall controlled

## 2022-02-06 NOTE — Assessment & Plan Note (Addendum)
Meets criteria with BMI greater than 25 

## 2022-02-06 NOTE — Progress Notes (Signed)
NAME:  Kenneth Benson, MRN:  [id number], DOB:  May 01, 1961, LOS: 89 ADMISSION DATE:  01/21/2022, CONSULTATION DATE: 01/21/2022 REFERRING MD: Dr. Cheri Fowler, CHIEF COMPLAINT: Shortness of Breath    History of Present Illness:  This is a 61 y.o. male who presented to [facility] ER via EMS on 02/24 with c/o shortness of breath, cough, wheezing, and congestion onset of symptoms several days prior to presentation. He reported using his inhaler without relief of symptoms.  EMS reported pt severely hypoxic with O2 sats in the 60's.  He received duoneb's x2 and 125 mg of iv solumedrol en route to the ER.  All hx obtained from chart review pt currently sedated and mechanically intubated with no family at bedside.   ED Course Upon arrival to the ER pt noted to be in moderate respiratory distress with diffuse wheezes per ED provider notes.  CXR, COVID-19/Influenza A&B, and pct negative.  VBG revealed pH 7.17/pCO2 99/bicarb 36.1.  EKG revealed sinus tachycardia hr 140 without ST elevation/depression and troponin 9.  He received duoneb x3 and 2 g of iv magnesium.  He was subsequently placed on Bipap with initial improvement of respiratory status.  However, respiratory status rapidly declined and pt required mechanical intubation.  He received an additional 2 g of iv magnesium and duoneb post intubation.  PCCM team contacted for ICU admission.  02/01/22- patient remains tachyarrythymic s/p amiodarone IV without response, reviewed with cardiology team.  Will switch sedation to precedex. Today. I spoke with GF [name] today and she was able to provide to [place] patients Sister contact information which has been uploaded to her chart. 02/02/22- patient improved with reduced FIO2 from 55 to 40. ENT consult for possible trache.Sister of patietn was finally located and she will come in to see patient tonight.  02/03/22- patient is weaned off sedation and tried SBT with failure after <99mn on 5/5 and we are planning to try again today.   02/04/22- patient for repeat SBT today.  02/05/22- patient liberated from MV and is lucid speaking in full sentences.  Severe Oral thrush on examination. Plan to optimize for TRH transfer.  02/06/22- patient further improved and PCCM will sign off with TRH continuing care.   Pertinent  Medical History  COPD Current Smoker  Asthma   Significant Hospital Events: Including procedures, antibiotic start and stop dates in addition to other pertinent events   02/24: Pt admitted to ICU with acute on chronic hypercapnic hypoxic respiratory failure secondary to asthma and COPD exacerbation requiring mechanical intubation  02/25: Remains intubated, vent changes allowed 02/27: Remains intubated, slowly weaning vent support. Respiratory culture with normal respiratory flora 03/01: Cardizem and Heparin gtts initiated by Cardiology for AF. Unable to tolerate decrease in sedation due to Tachycardia and hypoxia. 03/02: Sedation lightened for WUA, delirious but able to follow simple commands.  CXR concerning for possible pulmonary edema, diuresing with Diamox + Metolazone.  Heparin gtt changed to treatment dose Lovenox 03/4: Pt severely hypoxic overnight with elevated peak pressures requiring following ventilator changes: FiO2 90% and PEEP 10. ETT exchanged.  Amiodarone bolus followed by drip started due to atrial fibrillation with rvr 3/5: continued difficulty achieving rate control; remains on high vent settings 01/31/22- reduced sedation with dc of fentanyl and minimal propofol with initiation of ketamine due to status asthmaticus in context of mixed viral and bacterial LRTI    Objective   Blood pressure 108/80, pulse (!) 54, temperature 98.6 F (37 C), temperature source Oral, resp. rate 20, height  6' (1.829 m), weight 98.6 kg, SpO2 98 %.        During rounds was able to switch patient to [facility]:     Intake/Output Summary (Last 24 hours) at 02/06/2022 1144 Last data filed at 02/06/2022 1100 Gross per 24  hour  Intake 1646.08 ml  Output 1900 ml  Net -253.92 ml    Filed Weights   01/28/22 0456 01/31/22 0500 02/03/22 0825  Weight: 102.2 kg 103 kg 98.6 kg   Examination: General: age appropriate NDA Lungs: rhonchi b/l Cardiovascular: irregularly irregular, no M/R/G, 2+ radial/2+ distal pulses, 1+ pitting edema of BLE Abdomen: +BS x4, obese, soft, non tender, non distended  Neuro: GCS 12 with decreased ROM muscle strength 1/4x4 GU: indwelling foley draining clear yellow urine   Resolved Hospital Problem list   Acute kidney injury likely due to ATN Mild hyponatremia, hypochloremia  Assessment & Plan:   Acute on chronic hypoxic hypercapnic respiratory failure Status asthmaticus Rhinovirus bronchiolitis Concern for possible VAP~respiratory culture 03/3 moderate gram+ cocci in pair and moderate gram negative rods - Metapneumovirus NOT DETECTED NOT DETECTED   Rhinovirus / Enterovirus NOT DETECTED DETECTED Abnormal    Influenza A NOT DETECTED NOT DETECTED    0 Result Notes    Component 3 d ago  Specimen Description TRACHEAL ASPIRATE  Performed at [facility], [address], [place], [place] [address]   Special Requests NONE  Performed at [facility], [address] [place], [place] [address]   Gram Stain FEW WBC PRESENT, PREDOMINANTLY MONONUCLEAR  MODERATE GRAM NEGATIVE RODS  FEW GRAM POSITIVE COCCI IN PAIRS   Culture ABUNDANT HAEMOPHILUS INFLUENZAE  BETA LACTAMASE NEGATIVE  Performed at [facility], [place] [address] [place], [place] [address]   Report Status 01/31/2022 FINAL      Cardiogenic pulmonary edema Recurrent atrial fibrillation with rvr Intermittent ventricular bigeminy with frequent PVC's  TTE 01/21/22:  Left ventricular ejection fraction, by estimation, is 60  to 65%. The left ventricle has normal function. The left ventricle has no  regional wall motion abnormalities. The left ventricular internal cavity  size was mildly dilated.  There is  borderline concentric left ventricular hypertrophy. The interventricular  septum is flattened in diastole ('D' shaped left ventricle), consistent with right ventricular volume overload. Left ventricular diastolic parameters were normal.  -Continuous telemetry monitoring  -Continue amiodarone gtt; re-bolus today -Start scheduled Cardizem PO -Awaiting formal read of bubble study -Diurese for goal 1L negative today -Replace electrolytes as indicated  -Monitor UOP  -Continue therapeutic lovenox -Cardiology consulted appreciate input   Constipation -Continue current bowel regimen   Sedation needs in the setting of mechanical ventilation -Maintain a RASS goal of -4 to -5 due to worsening hypoxia  -Fentanyl and Propofol gtts along with prn vecuronium as needed to maintain RASS goal  Hyperglycemia, likely due to critical illness and steroids Hgb A1c 4.9 -CBG's q4h; Target range of 140 to 180 -SSI -Follow ICU Hypo/Hyperglycemia protocol  Best Practice (right click and "Reselect all SmartList Selections" daily)   Diet/type: NPO, tube feeds DVT prophylaxis: Therapeutic Lovenox  GI prophylaxis: PPI Lines: Central line and Arterial Line; will d/c CVC today and place PICC Foley:  Yes, and it is still needed Code Status:  full code Last date of multidisciplinary goals of care discussion [01/27/2022]  Significant other [name] Hostetter is only current contact. Need further clarification on status of additional family members to determine decision-maker. Labs   CBC: Recent Labs  Lab 02/02/22 0636  02/03/22 0600 02/04/22 0430 02/05/22 0505 02/06/22 0400  WBC 8.0 9.7 12.3* 10.1 11.1*  NEUTROABS  --   --  9.8* 7.7 9.0*  HGB 13.2 14.0 14.2 13.8 13.8  HCT 41.6 42.6 44.6 44.6 43.7  MCV 96.3 98.2 98.0 96.7 94.8  PLT 164 170 198 205 232     Basic Metabolic Panel: Recent Labs  Lab 02/02/22 0636 02/03/22 0600 02/04/22 0430 02/05/22 0505 02/06/22 0400  NA 148* 137 147* 145  138  K 4.0 3.9 3.6 3.6 3.6  CL 103 94* 103 103 104  CO2 37* 37* 39* 35* 27  GLUCOSE 124* 125* 148* 133* 112*  BUN 46* 42* 48* 42* 30*  CREATININE 0.58* 0.61 0.78 0.57* 0.45*  CALCIUM 7.8* 7.9* 8.3* 8.4* 8.1*  MG 2.2 2.6* 2.1 2.1 1.8  PHOS 3.3 2.3* 2.6 2.6 1.9*    GFR: Estimated Creatinine Clearance: 120.9 mL/min (A) (by C-G formula based on SCr of 0.45 mg/dL (L)). Recent Labs  Lab 02/03/22 0600 02/04/22 0430 02/05/22 0505 02/06/22 0400  WBC 9.7 12.3* 10.1 11.1*     Liver Function Tests: No results for input(s): AST, ALT, ALKPHOS, BILITOT, PROT, ALBUMIN in the last 168 hours.  No results for input(s): LIPASE, AMYLASE in the last 168 hours. No results for input(s): AMMONIA in the last 168 hours.  ABG    Component Value Date/Time   PHART 7.51 (H) 02/04/2022 0500   PCO2ART 55 (H) 02/04/2022 0500   PO2ART 59 (L) 02/04/2022 0500   HCO3 43.9 (H) 02/04/2022 0500   O2SAT 92.9 02/04/2022 0500      Coagulation Profile: No results for input(s): INR, PROTIME in the last 168 hours.   Cardiac Enzymes: No results for input(s): CKTOTAL, CKMB, CKMBINDEX, TROPONINI in the last 168 hours.  HbA1C: Hgb A1c MFr Bld  Date/Time Value Ref Range Status  01/21/2022 02:36 PM 4.9 4.8 - 5.6 % Final    Comment:    (NOTE) Pre diabetes:          5.7%-6.4%  Diabetes:              >6.4%  Glycemic control for   <7.0% adults with diabetes     CBG: Recent Labs  Lab 02/05/22 1111 02/05/22 1601 02/05/22 1954 02/05/22 2344 02/06/22 0423  GLUCAP 133* 128* 113* 91 126*     Review of Systems:   Unable to assess pt mechanically ventilated  Allergies No Known Allergies   Scheduled Meds:  amiodarone  200 mg Oral BID   arformoterol  15 mcg Nebulization BID   budesonide (PULMICORT) nebulizer solution  0.25 mg Nebulization BID   chlorhexidine gluconate (MEDLINE KIT)  15 mL Mouth Rinse BID   Chlorhexidine Gluconate Cloth  6 each Topical Q0600   diltiazem  90 mg Oral Q6H    enoxaparin (LOVENOX) injection  1 mg/kg Subcutaneous BID   fiber  1 packet Oral BID   glycopyrrolate  0.2 mg Intravenous TID   insulin aspart  0-15 Units Subcutaneous Q4H   ipratropium-albuterol  3 mL Nebulization TID   metoprolol tartrate  12.5 mg Oral Q6H   nystatin  5 mL Mouth/Throat QID   pantoprazole (PROTONIX) IV  40 mg Intravenous Q24H   predniSONE  50 mg Oral Q breakfast   sodium chloride flush  10-40 mL Intracatheter Q12H   sodium chloride flush  10-40 mL Intracatheter Q12H   Continuous Infusions:  sodium chloride Stopped (01/25/22 1112)   fluconazole (DIFLUCAN) IV Stopped (02/05/22 1248)   magnesium  sulfate bolus IVPB 2 g (02/06/22 1115)   potassium PHOSPHATE IVPB (in mmol) 85 mL/hr at 02/06/22 1100   PRN Meds:.acetaminophen (TYLENOL) oral liquid 160 mg/5 mL, albuterol, docusate, metoprolol tartrate, ondansetron (ZOFRAN) IV, sodium chloride flush, sodium chloride flush   Critical care provider statement:   Total critical care time: 33 minutes   Performed by: Lanney Gins MD   Critical care time was exclusive of separately billable procedures and treating other patients.   Critical care was necessary to treat or prevent imminent or life-threatening deterioration.   Critical care was time spent personally by me on the following activities: development of treatment plan with patient and/or surrogate as well as nursing, discussions with consultants, evaluation of patient's response to treatment, examination of patient, obtaining history from patient or surrogate, ordering and performing treatments and interventions, ordering and review of laboratory studies, ordering and review of radiographic studies, pulse oximetry and re-evaluation of patient's condition.    Ottie Glazier, M.D.  Pulmonary & Critical Care Medicine

## 2022-02-06 NOTE — Evaluation (Signed)
Occupational Therapy Evaluation Patient Details Name: Kenneth Benson MRN: [id number] DOB: 06-10-1961 Today's Date: 02/06/2022   History of Present Illness Pt admitted for asthma exacerbation with complaints of SOB and cough. Pt with complicated hospital stay with intubation 2/24-3/11.   Clinical Impression   Chart reviewed, RN cleared pt for participation in OT evaluation. Pt is alert and oriented to self, place, situation; not oriented to time. Pt with confabulatory statements, cognition will continue to be assessed. PTA pt was indep in all ADL/IADL, worked as a Games developer, performed cooking/cleaning. At this time, pt requires MAX A-TOTAL A in ADL. BUE PROM appears to be [facility], AROM  and strength impaired throughout.Pt with a weak grip and poor FMC/dexterity. Pt performs long sit from [facility] at 45 degrees 5x with MAX A, MAX A required for sustained sitting balance. BUE HEP provided via demo, pt demonstrating good teach back with slightly improved AROM noted throughout BUE. Pt with good tolerance for therapy and appears motivated. Pt is left as received, NAD, all needs met. Recommend AIR following discharge. OT will continue to follow acutely.      Recommendations for follow up therapy are one component of a multi-disciplinary discharge planning process, led by the attending physician.  Recommendations may be updated based on patient status, additional functional criteria and insurance authorization.   Follow Up Recommendations  Acute inpatient rehab (3hours/day)    Assistance Recommended at Discharge Frequent or constant Supervision/Assistance  Patient can return home with the following Two people to help with walking and/or transfers;Two people to help with bathing/dressing/bathroom;Assist for transportation;Assistance with feeding;Help with stairs or ramp for entrance;Direct supervision/assist for medications management;Direct supervision/assist for financial management;Assistance with  cooking/housework    Functional Status Assessment  Patient has had a recent decline in their functional status and demonstrates the ability to make significant improvements in function in a reasonable and predictable amount of time.  Equipment Recommendations  Other (comment) (per next venue of care)    Recommendations for Other Services       Precautions / Restrictions Precautions Precautions: Fall Restrictions Weight Bearing Restrictions: No      Mobility Bed Mobility Overal bed mobility: Needs Assistance Bed Mobility: Rolling Rolling: Max assist         General bed mobility comments: can reach with UEs towards bed rails, grasp lightly to assist    Transfers                   General transfer comment: unable to perform      Balance                                           ADL either performed or assessed with clinical judgement   ADL Overall ADL's : Needs assistance/impaired Eating/Feeding: NPO;Maximal assistance Eating/Feeding Details (indicate cue type and reason): pt is NPO except for ice chips and single sips of water; MAX A to bring cup to mouth Grooming: Wash/dry hands;Wash/dry face;Brushing hair;Maximal assistance;Bed level   Upper Body Bathing: Maximal assistance Upper Body Bathing Details (indicate cue type and reason): anticipated Lower Body Bathing: Maximal assistance Lower Body Bathing Details (indicate cue type and reason): anticipated Upper Body Dressing : Maximal assistance;Bed level   Lower Body Dressing: Total assistance;Bed level     Toilet Transfer Details (indicate cue type and reason): does not occur Toileting- Clothing Manipulation and Hygiene: Total assistance Toileting -  Clothing Manipulation Details (indicate cue type and reason): rectal tube and catheter at this time     Functional mobility during ADLs: Maximal assistance;Total assistance General ADL Comments: 5 attempts,  HOB 45 degrees to long sit  with MAX A. Pt requires MAX A to sustain static sitting for any length of time.     Vision Patient Visual Report: No change from baseline       Perception     Praxis      Pertinent Vitals/Pain Pain Assessment Pain Assessment: No/denies pain     Hand Dominance Right   Extremity/Trunk Assessment Upper Extremity Assessment Upper Extremity Assessment: LUE deficits/detail;RUE deficits/detail RUE Deficits / Details: R shoulder elevation AROM WFL, MMT 3/5; AROM shoulder flexion approx 1/4 full flexion, elbow approx 1/2 full elbow flexion, wrist approx 1/4 full flexion/extension, hand with weak grip strength. Poor FMC/dexterity. RUE Sensation: WNL RUE Coordination: decreased fine motor;decreased gross motor LUE Deficits / Details: L shoulder elevation AROM WFL, MMT 3/5; AROM shoulder flexion approx 1/4 full flexion, elbow approx 1/2 full elbow flexion, wrist approx 1/4 full flexion/extension, hand with weak grip strength. Poor FMC/dexterity. LUE Sensation: WNL LUE Coordination: decreased fine motor;decreased gross motor   Lower Extremity Assessment Lower Extremity Assessment: LLE deficits/detail;RLE deficits/detail RLE Deficits / Details: pt presents with foot drop RLE Sensation: WNL (reports tingling, sensation appears WFL) LLE Deficits / Details: pt presents with foot drop LLE Sensation: WNL (reports tingling, sensation appears WFL)       Communication Communication Communication: No difficulties   Cognition Arousal/Alertness: Awake/alert Behavior During Therapy: Flat affect Overall Cognitive Status: Impaired/Different from baseline Area of Impairment: Orientation, Safety/judgement, Awareness, Problem solving                 Orientation Level: Disoriented to, Time       Safety/Judgement: Decreased awareness of safety, Decreased awareness of deficits Awareness: Emergent Problem Solving: Slow processing, Requires verbal cues, Requires tactile cues General Comments:  Pleasant and agreeable, good one step direction following; Confabulatory- making statements such as "they brought my mobile home here", "I don't know where I live", however is oriented to self, place and gave a thorough explanation of acute illness     General Comments  start of eval: HR 101, SPO2 97 on 2 L [place], BP 102/78; end of eval: HR 99, SPO2 95% on 2 L [place], BP 106/64    Exercises General Exercises - Upper Extremity Shoulder Flexion: AAROM, Both, 5 reps Elbow Flexion: AROM, 10 reps, Both Elbow Extension: AROM, Both, 10 reps Wrist Flexion: AROM, Both, 10 reps Wrist Extension: AROM, Both, 10 reps Other Exercises Other Exercises: additional therex: B hand grip strength on towel 10x 2 sets, B hand opposition, AROM pronation/supination 10x 1 set Other Exercises: educated re: role of OT, role of rehab, discharge recommendations, HEP   Shoulder Instructions      Home Living Family/patient expects to be discharged to:: Private residence Living Arrangements: Alone Available Help at Discharge: Friend(s);Available PRN/intermittently Type of Home: Mobile home Home Access: Stairs to enter Entrance Stairs-Number of Steps: 1 Entrance Stairs-Rails: None Home Layout: One level     Bathroom Shower/Tub: Teacher, early years/pre: Standard     Home Equipment: Shower seat   Additional Comments: home set up will need to be verified pt appears confused      Prior Functioning/Environment Prior Level of Function : Independent/Modified Independent             Mobility Comments: pt reports amb with  no AD ADLs Comments: pt reports indep in ADL/IADL; was working as a Games developer, did his own cooking and cleaning. Lived with someone named [name] who provided assistance as needed (help with putting on socks)        OT Problem List: Decreased strength;Decreased knowledge of use of DME or AE;Decreased range of motion;Decreased coordination;Decreased knowledge of precautions;Decreased  activity tolerance;Cardiopulmonary status limiting activity;Impaired UE functional use;Impaired balance (sitting and/or standing);Impaired sensation      OT Treatment/Interventions: Self-care/ADL training;Visual/perceptual remediation/compensation;Therapeutic exercise;Patient/family education;Neuromuscular education;DME and/or AE instruction;Therapeutic activities;Cognitive remediation/compensation;Splinting;Modalities;Manual therapy;Balance training    OT Goals(Current goals can be found in the care plan section) Acute Rehab OT Goals Patient Stated Goal: feel better OT Goal Formulation: With patient Time For Goal Achievement: 02/20/22 Potential to Achieve Goals: Good ADL Goals Pt Will Perform Grooming: with mod assist;sitting Pt Will Perform Upper Body Dressing: with mod assist;sitting Pt Will Perform Lower Body Dressing: with mod assist;bed level Pt Will Transfer to Toilet: with mod assist;stand pivot transfer;bedside commode Pt/caregiver will Perform Home Exercise Program: Both right and left upper extremity;Increased ROM;Increased strength;With Supervision;With written HEP provided  OT Frequency: Min 3X/week    Co-evaluation              AM-PAC OT "6 Clicks" Daily Activity     Outcome Measure Help from another person eating meals?: Total Help from another person taking care of personal grooming?: A Lot Help from another person toileting, which includes using toliet, bedpan, or urinal?: Total Help from another person bathing (including washing, rinsing, drying)?: Total Help from another person to put on and taking off regular upper body clothing?: A Lot Help from another person to put on and taking off regular lower body clothing?: Total 6 Click Score: 8   End of Session Equipment Utilized During Treatment: Oxygen Nurse Communication: Mobility status  Activity Tolerance: Patient tolerated treatment well Patient left: in bed;with call bell/phone within reach  OT Visit  Diagnosis: Muscle weakness (generalized) (M62.81)                Time: 5361-4431 OT Time Calculation (min): 26 min Charges:  OT General Charges $OT Visit: 1 Visit OT Evaluation $OT Eval High Complexity: 1 High OT Treatments $Therapeutic Exercise: 8-22 mins  Shanon Payor, OTD OTR/L  02/06/22, 4:13 PM

## 2022-02-06 NOTE — Hospital Course (Signed)
Patient is a 61 year old male past medical history of COPD, asthma and ongoing tobacco abuse who presented to the emergency room on 2/24 with shortness of breath and found to be in significant respiratory distress.  Initially placed on BiPAP but but started to decline regardless and ended up being placed on a ventilator and admitted to the critical care service.  Hospital course complicated by development of atrial fibrillation.  Cardiology consulted and patient placed on Cardizem and eventually amiodarone.  Required diuresis.  Finally after multiple attempts to wean off of ventilator, able to be fully extubated by 3/11.  Transferred to hospitalist service starting 3/12.

## 2022-02-06 NOTE — Assessment & Plan Note (Signed)
Counseled.  Declines nicotine patch ?

## 2022-02-06 NOTE — Progress Notes (Signed)
[facility] ? ?Patient ID: ?Kenneth Benson ?MRN: [id number] ?DOB/AGE: 01/13/1961 61 y.o. ?  ?Admit date: 01/21/2022 ?Referring Physician Dr. Valora Piccolo ?Primary Physician  ?Primary Cardiologist  ?Reason for Consultation EKG changes ?  ?HPI: The patient is a 61 year old male with a past medical history notable for asthma, COPD, and current tobacco use who presented to [facility] ED in the early morning hours complaining of shortness of breath with worsening cough and congestion.  Per EMS reporting the patient's O2 sats were in the 60s and he was given 2 DuoNebs and Solu-Medrol on route.  He presented to the ED in moderate respiratory distress with diffuse wheezing and had chest tightness without chest pain.  He was given DuoNebs x3 and 2 g of IV mag and was subsequently placed on BiPAP and eventually required intubation due to worsening respiratory status.  Cardiology was consulted because of ventricular bigeminy, and then now persistent new onset atrial fibrillation.  He remains intubated on the ventilator. ? ?Interval History: ?- Remains on 2 L [place] ?- Back in AF yesterday AF, with elevated HR during activity.  ?- Denies chest pain. Still short of breath.  ?- Very weak.  ? ? ?Vitals:  ? 02/06/22 0503 02/06/22 0600 02/06/22 0700 02/06/22 0800  ?BP: 122/71 122/72 110/77 116/68  ?Pulse:  100 69 (!) 46  ?Resp:  17 (!) 27 (!) 25  ?Temp:    98.6 ?F (37 ?C)  ?TempSrc:    Oral  ?SpO2:  99% 98% 98%  ?Weight:      ?Height:      ? ? ? ?Intake/Output Summary (Last 24 hours) at 02/06/2022 0856 ?Last data filed at 02/06/2022 0700 ?Gross per 24 hour  ?Intake 1456.24 ml  ?Output 1550 ml  ?Net -93.76 ml  ? ? ? ?PHYSICAL EXAM ?General: Acutely ill-appearing middle-aged Caucasian male, intubated in the ICU  ?HEENT:  Normocephalic and atraumatic. ?Neck:   No JVD.  ?Lungs: Intubated. Air movement somewhat improving, coarse breath sounds and without wheezes  ?Heart: Tachy irregularly irregular rhythm. Normal S1 and S2 without gallops or murmurs.  Radial & DP pulses 2+ bilaterally. ?Abdomen: Obese appearing ?Msk: Normal tone for age. ?Extremities: Warm and well perfused. No clubbing, cyanosis.  Trace lower extremity edema bilaterally. ?Neuro: Spontaneously opens his eyes and tracks me in the room moves his head from side to side. ?Psych: Unable to assess  ? ? ?LABS: ?Basic Metabolic Panel: ?Recent Labs  ?  02/05/22 ?0505 02/06/22 ?0400  ?NA 145 138  ?K 3.6 3.6  ?CL 103 104  ?CO2 35* 27  ?GLUCOSE 133* 112*  ?BUN 42* 30*  ?CREATININE 0.57* 0.45*  ?CALCIUM 8.4* 8.1*  ?MG 2.1 1.8  ?PHOS 2.6 1.9*  ? ? ?Liver Function Tests: ?No results for input(s): AST, ALT, ALKPHOS, BILITOT, PROT, ALBUMIN in the last 72 hours. ? ?No results for input(s): LIPASE, AMYLASE in the last 72 hours. ?CBC: ?Recent Labs  ?  02/05/22 ?0505 02/06/22 ?0400  ?WBC 10.1 11.1*  ?NEUTROABS 7.7 9.0*  ?HGB 13.8 13.8  ?HCT 44.6 43.7  ?MCV 96.7 94.8  ?PLT 205 232  ? ? ?Cardiac Enzymes: ?No results for input(s): CKTOTAL, CKMB, CKMBINDEX, TROPONINI in the last 72 hours. ?BNP: ?Invalid input(s): POCBNP ?D-Dimer: ?No results for input(s): DDIMER in the last 72 hours. ?Hemoglobin A1C: ?No results for input(s): HGBA1C in the last 72 hours. ?Fasting Lipid Panel: ?Recent Labs  ?  02/03/22 ?1459  ?TRIG 110  ? ? ?Thyroid Function Tests: ?No results for input(s):  TSH, T4TOTAL, T3FREE, THYROIDAB in the last 72 hours. ? ?Invalid input(s): FREET3 ? ?Anemia Panel: ?No results for input(s): VITAMINB12, FOLATE, FERRITIN, TIBC, IRON, RETICCTPCT in the last 72 hours. ? ?No results found. ? ? ?Echo LVEF 60-65% ? ?TELEMETRY: sinus rhythm with ventricular bigeminy, rate 100-120 ? ?ASSESSMENT AND PLAN: ? ?Principal Problem: ?  Asthma exacerbation ?Active Problems: ?  COPD exacerbation ([place]) ?  Sepsis ([place]) ?  Tobacco abuse ?  Acute respiratory failure with hypoxia and hypercapnia (HCC) ?  Acute on chronic respiratory failure with hypercapnia (HCC) ?  Arterial line in place ?  ? ?1. Atrial fibrillation with RVR,  compensatory for underlying respiratory failure, on Lovenox for stroke prevention. In NSR 3/9, but recurrent on 3/10 ?2.  Frequent PVCs of uncertain clinical significance, normal left ventricular function ?3.  Respiratory failure / asthma / COPD exacerbation / severe ARDS / pansinusitis extubated 02/05/22. Still wheezing on exam. ? ?Recommendations ?  ?1.  Agree with current therapy per PCCM ?2.  received roughly 9g of amiodarone IV since admission, continue 200mg  BID  BID continue  diltiazem 90mg  q6h. metoprolol 12.5 q6h; titrate as needed. ?3.  Continue Lovenox for stroke prevention ?4.  Hold on lasix today. None given since 02/03/22.  ?5.  Defer further cardiac diagnostics at this time ? ? ?Andrez Grime, MD ?02/06/2022 ?8:56 AM ? ? ? ?  ?

## 2022-02-06 NOTE — Evaluation (Signed)
Clinical/Bedside Swallow Evaluation ?Patient Details  ?Name: Kenneth Benson ?MRN: [id number] ?Date of Birth: Aug 12, 1961 ? ?Today's Date: 02/06/2022 ?Time: SLP Start Time (ACUTE ONLY): 0850 SLP Stop Time (ACUTE ONLY): 0915 ?SLP Time Calculation (min) (ACUTE ONLY): 25 min ? ?Past Medical History:  ?Past Medical History:  ?Diagnosis Date  ? Asthma   ? Tobacco abuse   ? ?Past Surgical History: History reviewed. No pertinent surgical history. ?HPI:  ?Per Physician's H&P on admission "This is a 61 yo male who presented to [facility] ER via EMS on 02/24 with c/o shortness of breath, cough, wheezing, and congestion onset of symptoms several days prior to presentation. He reported using his inhaler without relief of symptoms.  EMS reported pt severely hypoxic with O2 sats in the 60's.  He received duoneb's x2 and 125 mg of iv solumedrol en route to the ER.  All hx obtained from chart review pt currently sedated and mechanically intubated with no family at bedside." Pt intubated 2/24-3/11. ENT consulted for tracheostomy during this admission.  ?  ?Assessment / Plan / Recommendation  ?Clinical Impression ? Pt alert, pleasant, and cooperative. Mainly aphonic vocal quality with periods of hoarse/hypophonic vocal quality observed. On 2L/min O2 via [place]. Cleared with RN. ? ?Oral motor exam completed. Dysphonia present as noted above. Pt edentulous and reports not owning dentures. No orofacial or lingual weakness noted. Pt with white coating on lingual body. Weak volitional cough noted. Pt noted using [place] "every once in awhile" when he "coughs things up" which is concerning for reduced secretion management.  ? ?Pt given trials of ice chips, thin liquids (via cup sip) and applesauce. Oral phase was functional for consistencies given. Pt with s/sx concerning for suspected pharyngeal dysphagia including seemingly reduced laryngeal elevation to palpation and spontaneous secondary swallows. No overt s/sx pharyngeal dysphagia noted; however,  pt is at increased risk for SILENT aspiration given recent/prolonged intubation (2/24-3/11/23).  ? ?Recommend NPO x single ice chips and single sips of water for comfort. High priority pills may be given crushed in applesauce.  ? ?Pt is at increased risk for aspiration/aspiration PNA given dysphonic vocal quality, multiple medical comorbidities, and overall deconditioned state in setting of VDRF with prolonged/recent intubation.  ? ?MBSS tentatively planned for 3/13 to instrumentally evaluate pt's oropharyngeal swallow function.  ? ?Pt educated at length re: role of SLP, clinical risk factors for SILENT aspiration/aspiration PNA, SLP recommendations, MBSS procedure/rationale, and SLP POC. Pt verbalized understanding/agreement. RN and MD also made aware of results, recommendations, and SLP POC.  ? ? ?SLP Visit Diagnosis: Dysphagia, pharyngeal phase (R13.13) ?   ?Aspiration Risk ? Moderate aspiration risk  ?  ?Diet Recommendation NPO except meds;Ice chips PRN after oral care (single sips of water and single ice chips OK for comfort; high priority meds may be crushed in applesauce)  ? ?Medication Administration: Crushed with puree (high priority medication) ?Supervision: Staff to assist with self feeding (ice chips, sips of water, meds crushed in applesauce)  ?  ?Other  Recommendations Oral Care Recommendations: Oral care QID;Oral care prior to ice chip/H20   ? ?Recommendations for follow up therapy are one component of a multi-disciplinary discharge planning process, led by the attending physician.  Recommendations may be updated based on patient status, additional functional criteria and insurance authorization. ? ?Follow up Recommendations TBD ? ? ?  ?Assistance Recommended at Discharge Frequent or constant Supervision/Assistance  ?Functional Status Assessment Patient has had a recent decline in their functional status and demonstrates the ability to make  significant improvements in function in a reasonable and  predictable amount of time.  ?Frequency and Duration  (TBD pending results of MBSS)  ?  ?  ?   ? ?Prognosis TBD ? ?  ? ?Swallow Study   ?General Date of Onset: 01/21/22 ?HPI: Per Physician's H&P on admission "This is a 61 yo male who presented to [facility] ER via EMS on 02/24 with c/o shortness of breath, cough, wheezing, and congestion onset of symptoms several days prior to presentation. He reported using his inhaler without relief of symptoms.  EMS reported pt severely hypoxic with O2 sats in the 60's.  He received duoneb's x2 and 125 mg of iv solumedrol en route to the ER.  All hx obtained from chart review pt currently sedated and mechanically intubated with no family at bedside." Pt intubated 2/24-3/11. ENT consulted for tracheostomy during this admission. ?Type of Study: Bedside Swallow Evaluation ?Previous Swallow Assessment: unknown ?Diet Prior to this Study:  (Clear liquid; Thin Liquids) ?Temperature Spikes Noted: Yes ?Respiratory Status: Nasal cannula (2L/min) ?History of Recent Intubation: Yes ?Length of Intubations (days): 16 days ?Date extubated: 02/05/22 ?Behavior/Cognition: Alert;Cooperative;Pleasant mood ?Oral Cavity Assessment: Within Functional Limits (white coating; ?thrush) ?Oral Care Completed by SLP: Recent completion by staff (observed RN completing oral care) ?Oral Cavity - Dentition: Edentulous ?Vision: Functional for self-feeding ?Self-Feeding Abilities: Total assist (due to BUE weakness) ?Patient Positioning: Upright in bed ?Baseline Vocal Quality: Aphonic;Hoarse;Low vocal intensity ?Volitional Cough: Weak ?Volitional Swallow: Able to elicit  ?  ?Oral/Motor/Sensory Function Overall Oral Motor/Sensory Function: Within functional limits   ?Ice Chips Ice chips: Impaired ?Pharyngeal Phase Impairments: Decreased hyoid-laryngeal movement;Multiple swallows   ?Thin Liquid Thin Liquid: Impaired ?Presentation: Cup ?Pharyngeal  Phase Impairments: Decreased hyoid-laryngeal movement;Multiple swallows  ?   ?Puree Puree: Impaired ?Pharyngeal Phase Impairments: Decreased hyoid-laryngeal movement;Multiple swallows   ?   ?Clyde Canterbury, M.S., CCC-SLP ?Speech-Language Pathologist ?[place] [facility] ?[phone])  ?    ?Woodroe Chen ?02/06/2022,12:35 PM ? ? ? ?

## 2022-02-06 NOTE — Assessment & Plan Note (Addendum)
-   Secondary COPD and asthma exacerbations.  Able to be weaned off of ventilator.   - Solu-Medrol changed over to p.o. prednisone and tapering - Able to be weaned to room air

## 2022-02-06 NOTE — Plan of Care (Signed)
Continuing with plan of care. 

## 2022-02-06 NOTE — Progress Notes (Signed)
[facility] Progress Note ? ?Patient: Kenneth Benson    JQZ:[id number]  DOA: 01/21/2022    ?Date of Service: the patient was seen and examined on 02/06/2022 ? ?Brief hospital course: ?Patient is a 61 year old male past medical history of COPD, asthma and ongoing tobacco abuse who presented to the emergency room on 2/24 with shortness of breath and found to be in significant respiratory distress.  Initially placed on BiPAP but but started to decline regardless and ended up being placed on a ventilator and admitted to the critical care service. ? ?Hospital course complicated by development of atrial fibrillation.  Cardiology consulted and patient placed on Cardizem and eventually amiodarone.  Required diuresis.  Finally after multiple attempts to wean off of ventilator, able to be fully extubated by 3/11.  Transferred to hospitalist service starting 3/12. ? ?Assessment and Plan: ?Assessment and Plan: ?* Asthma exacerbation ?As above.  Continue nebulizers plus oxygen plus steroids. ? ?Acute respiratory failure with hypoxia and hypercapnia (HCC) ?Secondary COPD and asthma exacerbations.  Able to be weaned off of ventilator.  Solu-Medrol changed over to p.o. prednisone.  Continue nebulizers.  Currently on 2 L nasal cannula.  Patient normally not on any oxygenation. ? ?COPD exacerbation (HCC) ?As above.  Steroids plus nebs plus oxygen.  Procalcitonin during this hospitalization normal.  No antibiotics.  No signs of infection, sepsis has been ruled out ? ?A-fib (HCC) ?New onset.  Being seen by cardiology.  Goes in and out.  On Cardizem and amiodarone on Lovenox for stroke prevention.  Given some Lasix earlier.  Echocardiogram from 3/4 notes no atrial enlargement.  Heart rate overall controlled ? ?Dysphagia ?No evidence of any neurological deficit.  Patient has been on a ventilator for some time and therefore, still weak.  Seen by speech therapy and will remain n.p.o. at risk for silent aspiration.  Should improve over  the next few days. ? ?Overweight (BMI 25.0-29.9) ?Meets criteria with BMI greater than 25 ? ?Tobacco abuse ?Counseled.  Declines nicotine patch ? ? ? ? ? ? ?Body mass index is 29.48 kg/m?Marland Kitchen  ?Nutrition Problem: Inadequate oral intake ?Etiology: inability to eat (pt sedated and ventilated) ?   ? ?Consultants: ?Critical care ?Cardiology ? ?Procedures: ?Ventilator support 2/24 - 3/11 ?Echocardiogram: Preserved ejection fraction, no evidence of diastolic dysfunction. ? ?Antimicrobials: ?None ? ?Code Status: Full code ? ? ?Subjective: Left message for sister ? ?Objective: ?Vital signs were reviewed and unremarkable. ?Vitals:  ? 02/06/22 1200 02/06/22 1300  ?BP: 107/73 96/75  ?Pulse: (!) 35 89  ?Resp: (!) 22 (!) 22  ?Temp: 98.7 ?F (37.1 ?C)   ?SpO2: 100% 100%  ? ? ?Intake/Output Summary (Last 24 hours) at 02/06/2022 1409 ?Last data filed at 02/06/2022 1300 ?Gross per 24 hour  ?Intake 1099.11 ml  ?Output 2200 ml  ?Net -1100.89 ml  ? ?Filed Weights  ? 01/28/22 0456 01/31/22 0500 02/03/22 0825  ?Weight: 102.2 kg 103 kg 98.6 kg  ? ?Body mass index is 29.48 kg/m?. ? ?Exam: ? ?General: Alert and oriented x3, no acute distress, hoarse ?HEENT: Normocephalic and atraumatic, mucous membranes slightly dry ?Cardiovascular: Irregular rhythm, rate controlled ?Respiratory: Decreased breath sounds throughout, prolonged expiratory phase ?Abdomen: Soft, nontender, nondistended, positive bowel sounds ?Musculoskeletal: No clubbing or cyanosis, trace pitting edema ?Skin: No skin breaks, tears or lesions ?Psychiatry: Appropriate, no evidence of psychoses ?Neurology: No focal deficits ? ?Data Reviewed: ?Labs for today are unremarkable ? ?Disposition:  ?Status is: Inpatient ?Remains inpatient appropriate because: Continue further stabilization, PT evaluation,  improvement in swallowing ability ?  ? ?Family Communication: Left message for sister ?DVT Prophylaxis: ?SCDs Start: 01/21/22 0901 ? ? ? ?Author: ?Hollice Espy ,MD ?02/06/2022 2:09  PM ? ?To reach On-call, see care teams to locate the attending and reach out via [email or web address]. ?Between 7PM-7AM, please contact night-coverage ?If you still have difficulty reaching the attending provider, please page the [facility] (Director on Call) for [facility] on amion for assistance. ? ?

## 2022-02-06 NOTE — Assessment & Plan Note (Addendum)
As above.  Set off by rhinovirus/enterovirus.  Steroids plus nebs plus oxygen.  Procalcitonin during this hospitalization normal.  No antibiotics.  Sepsis ruled out.

## 2022-02-06 NOTE — Consult Note (Signed)
PHARMACY CONSULT NOTE ? ?Pharmacy Consult for Electrolyte Monitoring and Replacement  ? ?Recent Labs: ?Potassium (mmol/L)  ?Date Value  ?02/06/2022 3.6  ? ?Magnesium (mg/dL)  ?Date Value  ?02/06/2022 1.8  ? ?Calcium (mg/dL)  ?Date Value  ?02/06/2022 8.1 (L)  ? ?Albumin (g/dL)  ?Date Value  ?01/29/2022 2.8 (L)  ? ?Phosphorus (mg/dL)  ?Date Value  ?02/06/2022 1.9 (L)  ? ?Sodium (mmol/L)  ?Date Value  ?02/06/2022 138  ? ?Corrected Ca: 8.76 mg/dL ? ?Assessment: ?Patient is a 61 y/o M with medical history including COPD / asthma and tobacco abuse who presented to the ED 2/24 with acute respiratory failure secondary to asthma / COPD exacerbation. Patient ultimately required intubation. Disposition is the ICU. Pharmacy consulted to assist with electrolyte monitoring and replacement as indicated. On amiodarone.  ? ?Nutrition:  ?Vital HP at 60>74ml/hr + ProSource TF 49ml TID via tube  ? Stopped Free water flushes 30>38mL q4 hours ? ?Diuretics: ?Lasix stopped.  ? ?Goal of Therapy:  ?Potassium 4.0 - 5.1 mmol/L ?Magnesium 2.0 - 2.4 mg/dL ?All Other Electrolytes within normal limits ? ?Plan:  ?[place] 30 mmol IV x1  ?Mg 2 g IV x 1  ?F/u with AM labs.  ? ?Paschal Dopp, PharmD, BCPS ?Clinical Pharmacist ?02/06/2022 8:14 AM ? ? ? ? ?

## 2022-02-06 NOTE — Assessment & Plan Note (Addendum)
-    Set off by rhinovirus/enterovirus.  Continue nebulizers.  Tapering steroids

## 2022-02-06 NOTE — Assessment & Plan Note (Addendum)
-   Slowly improving after extubation

## 2022-02-07 ENCOUNTER — Encounter: Payer: Self-pay | Admitting: Anesthesiology

## 2022-02-07 ENCOUNTER — Inpatient Hospital Stay: Payer: Self-pay

## 2022-02-07 DIAGNOSIS — R5381 Other malaise: Secondary | ICD-10-CM | POA: Diagnosis not present

## 2022-02-07 DIAGNOSIS — B370 Candidal stomatitis: Secondary | ICD-10-CM | POA: Diagnosis not present

## 2022-02-07 LAB — CBC
HCT: 41.9 % (ref 39.0–52.0)
Hemoglobin: 14.2 g/dL (ref 13.0–17.0)
MCH: 31.5 pg (ref 26.0–34.0)
MCHC: 33.9 g/dL (ref 30.0–36.0)
MCV: 92.9 fL (ref 80.0–100.0)
Platelets: 297 10*3/uL (ref 150–400)
RBC: 4.51 MIL/uL (ref 4.22–5.81)
RDW: 13.6 % (ref 11.5–15.5)
WBC: 10.5 10*3/uL (ref 4.0–10.5)
nRBC: 0 % (ref 0.0–0.2)

## 2022-02-07 LAB — BASIC METABOLIC PANEL
Anion gap: 6 (ref 5–15)
BUN: 28 mg/dL — ABNORMAL HIGH (ref 6–20)
CO2: 27 mmol/L (ref 22–32)
Calcium: 7.9 mg/dL — ABNORMAL LOW (ref 8.9–10.3)
Chloride: 104 mmol/L (ref 98–111)
Creatinine, Ser: 0.57 mg/dL — ABNORMAL LOW (ref 0.61–1.24)
GFR, Estimated: >60 mL/min (ref >60)
Glucose, Bld: 90 mg/dL (ref 70–99)
Potassium: 3.5 mmol/L (ref 3.5–5.1)
Sodium: 137 mmol/L (ref 135–145)

## 2022-02-07 LAB — PHOSPHORUS: Phosphorus: 2.3 mg/dL — ABNORMAL LOW (ref 2.5–4.6)

## 2022-02-07 LAB — MAGNESIUM: Magnesium: 2.1 mg/dL (ref 1.7–2.4)

## 2022-02-07 LAB — GLUCOSE, CAPILLARY
Glucose-Capillary: 102 mg/dL — ABNORMAL HIGH (ref 70–99)
Glucose-Capillary: 115 mg/dL — ABNORMAL HIGH (ref 70–99)
Glucose-Capillary: 123 mg/dL — ABNORMAL HIGH (ref 70–99)
Glucose-Capillary: 130 mg/dL — ABNORMAL HIGH (ref 70–99)
Glucose-Capillary: 132 mg/dL — ABNORMAL HIGH (ref 70–99)
Glucose-Capillary: 92 mg/dL (ref 70–99)
Glucose-Capillary: 93 mg/dL (ref 70–99)
Glucose-Capillary: 94 mg/dL (ref 70–99)

## 2022-02-07 MED ORDER — ENSURE ENLIVE PO LIQD
237.0000 mL | Freq: Two times a day (BID) | ORAL | Status: DC
  Administered 2022-02-08 – 2022-02-13 (×11): 237 mL via ORAL

## 2022-02-07 MED ORDER — POTASSIUM PHOSPHATES 15 MMOLE/5ML IV SOLN
15.0000 mmol | Freq: Once | INTRAVENOUS | Status: AC
  Administered 2022-02-07: 15 mmol via INTRAVENOUS
  Filled 2022-02-07: qty 5

## 2022-02-07 MED ORDER — FLUCONAZOLE 100 MG PO TABS
100.0000 mg | ORAL_TABLET | Freq: Every day | ORAL | Status: AC
  Administered 2022-02-08 – 2022-02-09 (×2): 100 mg via ORAL
  Filled 2022-02-07 (×2): qty 1

## 2022-02-07 MED ORDER — PREDNISONE 20 MG PO TABS
40.0000 mg | ORAL_TABLET | Freq: Every day | ORAL | Status: DC
  Administered 2022-02-08: 40 mg via ORAL
  Filled 2022-02-07: qty 2

## 2022-02-07 MED ORDER — ADULT MULTIVITAMIN W/MINERALS CH
1.0000 | ORAL_TABLET | Freq: Every day | ORAL | Status: DC
  Administered 2022-02-07 – 2022-02-13 (×7): 1 via ORAL
  Filled 2022-02-07 (×7): qty 1

## 2022-02-07 NOTE — TOC Initial Note (Signed)
Transition of Care (TOC) - Initial/Assessment Note  ? ? ?Patient Details  ?Name: Kenneth Benson ?MRN: [id number] ?Date of Birth: [birth year] ? ?Transition of Care (TOC) CM/SW Contact:    ?Esbeidy Mclaine A Emrey Thornley, LCSW ?Phone Number: ?02/07/2022, 11:21 AM ? ?Clinical Narrative:   CSW spoke with pt at beside. Pt alert and oriented. CSW explained reason for visit, to discuss dc plans. Pt states the MD was talking to him about the inpatient rehab. CSW explained that pt would have to do 3 hours of therapy a day and pt states he would be ok with that. CSW inquired about dc plans from rehab and he states he lives alone. CSW explained that this may be a barrier for CIR as they require support at home at d/c. Pt is understanding the process and is agreeable to SNF fax out in the case CIR will not accept him. Pt did not have a facility preference instead would prefer [place] area. CSW will send referral for SNF backup.              ? ? ?Expected Discharge Plan: IP Rehab Facility (CIR vs SNF) ?Barriers to Discharge: Continued Medical Work up ? ? ?Patient Goals and CMS Choice ?Patient states their goals for this hospitalization and ongoing recovery are:: to get better, to go to inpatient rehab ?  ?Choice offered to / list presented to : Patient ? ?Expected Discharge Plan and Services ?Expected Discharge Plan: IP Rehab Facility (CIR vs SNF) ?  ?  ?Post Acute Care Choice: Skilled Nursing Facility, IP Rehab ?Living arrangements for the past 2 months: Single Family Home ?                ?  ?  ?  ?  ?  ?  ?  ?  ?  ?  ? ?Prior Living Arrangements/Services ?Living arrangements for the past 2 months: Single Family Home ?Lives with:: Self ?Patient language and need for interpreter reviewed:: Yes ?Do you feel safe going back to the place where you live?: Yes      ?Need for Family Participation in Patient Care: Yes (Comment) ?Care giver support system in place?: Yes (comment) ?  ?Criminal Activity/Legal Involvement Pertinent to Current  Situation/Hospitalization: No - Comment as needed ? ?Activities of Daily Living ?Home Assistive Devices/Equipment: None ?ADL Screening (condition at time of admission) ?Patient's cognitive ability adequate to safely complete daily activities?: Yes ?Is the patient deaf or have difficulty hearing?: No ?Does the patient have difficulty seeing, even when wearing glasses/contacts?: No ?Does the patient have difficulty concentrating, remembering, or making decisions?: No ?Patient able to express need for assistance with ADLs?: Yes ?Does the patient have difficulty dressing or bathing?: Yes ?Independently performs ADLs?: No ?Communication: Independent ?Is this a change from baseline?: Pre-admission baseline ?Dressing (OT): Dependent ?Is this a change from baseline?: Change from baseline, expected to last >3 days ?Grooming: Dependent ?Is this a change from baseline?: Change from baseline, expected to last >3 days ?Feeding: Needs assistance ?Is this a change from baseline?: Change from baseline, expected to last >3 days ?Bathing: Dependent ?Is this a change from baseline?: Change from baseline, expected to last >3 days ?Toileting: Dependent ?Is this a change from baseline?: Change from baseline, expected to last >3days ?In/Out Bed: Dependent ?Is this a change from baseline?: Change from baseline, expected to last >3 days ?Walks in Home: Independent ?Is this a change from baseline?: Change from baseline, expected to last >3 days ?Does the patient have difficulty walking  or climbing stairs?: Yes ?Weakness of Legs: Both ?Weakness of Arms/Hands: Both ? ?Permission Sought/Granted ?Permission sought to share information with : Family Supports ?Permission granted to share information with : Yes, Verbal Permission Granted ? Share Information with NAME: Raynelle Fanning ?   ? Permission granted to share info w Relationship: sister ?   ? ?Emotional Assessment ?Appearance:: Appears stated age ?Attitude/Demeanor/Rapport: Engaged ?Affect (typically  observed): Accepting ?Orientation: : Oriented to Self, Oriented to Place, Oriented to  Time, Oriented to Situation ?Alcohol / Substance Use: Not Applicable ?Psych Involvement: No (comment) ? ?Admission diagnosis:  COPD exacerbation (HCC) [J44.1] ?Asthma exacerbation [J45.901] ?Acute on chronic respiratory failure with hypercapnia (HCC) [J96.22] ?Acute respiratory failure with hypoxia and hypercapnia (HCC) [J96.01, J96.02] ?Patient Active Problem List  ? Diagnosis Date Noted  ? Overweight (BMI 25.0-29.9) 02/06/2022  ? A-fib (HCC) 02/06/2022  ? Dysphagia 02/06/2022  ? Arterial line in place   ? COPD exacerbation (HCC) 01/21/2022  ? Asthma exacerbation 01/21/2022  ? Tobacco abuse 01/21/2022  ? Acute respiratory failure with hypoxia and hypercapnia (HCC)   ? ?PCP:  Pcp, No ?Pharmacy:   ?[id number] - [place] ([place]), [place] - [address] ?[address] ?[place] [address] ?Phone: [phone] Fax: [phone] ? ? ? ? ?Social Determinants of Health (SDOH) Interventions ?  ? ?Readmission Risk Interventions ?No flowsheet data found. ? ? ?

## 2022-02-07 NOTE — Progress Notes (Signed)
Palliative: ?[name], is sitting up in bed.  He greets me, making and mostly keeping eye contact.  He appears acutely ill and weak.  He is alert and oriented, able to make his basic needs known.  Although there is no family present, bedside nursing staff is present attending to needs. ? ?[name] and I talk about his acute health concerns.  He tells me that he does not remember his time on life support.  He tells me that he is ready to make some serious changes in his life, he will not return to smoking.  We talked about time for rehab.  Kenneth Benson shares that a doctor earlier spoke with him about CIR, short-term intensive rehab. ? ?We talked about healthcare power of attorney and CODE STATUS.  He tells me that he is unsure if he would want to be on life support again. ? ?Conference with attending, bedside nursing staff, transition of care team related to patient condition, needs, goals of care, disposition. ? ?Plan:    At this point full scope/full code, awaiting eval for CIR. ? ?50 minutes  ?Kenneth Carmel, NP ?Palliative medicine team ?Team phone [phone] ?Greater than 50% of this time was spent counseling and coordinating care related to the above assessment and plan. ?

## 2022-02-07 NOTE — Progress Notes (Signed)
[facility] Progress Note ? ?Patient: Kenneth Benson    [id number]  DOA: 01/21/2022    ?Date of Service: the patient was seen and examined on 02/07/2022 ? ?Brief hospital course: ?Patient is a 61 year old male past medical history of COPD, asthma and ongoing tobacco abuse who presented to the emergency room on 2/24 with shortness of breath and found to be in significant respiratory distress.  Initially placed on BiPAP but but started to decline regardless and ended up being placed on a ventilator and admitted to the critical care service. ? ?Hospital course complicated by development of atrial fibrillation.  Cardiology consulted and patient placed on Cardizem and eventually amiodarone.  Required diuresis.  Finally after multiple attempts to wean off of ventilator, able to be fully extubated by 3/11.  Transferred to hospitalist service starting 3/12. ? ?Assessment and Plan: ?Assessment and Plan: ?* Asthma exacerbation ?As above.  Set off by rhinovirus/enterovirus.  Continue nebulizers plus oxygen plus steroids. ? ?Acute respiratory failure with hypoxia and hypercapnia (HCC) ?Secondary COPD and asthma exacerbations.  Able to be weaned off of ventilator.  Solu-Medrol changed over to p.o. prednisone.  Continue nebulizers.  Currently on 2 L nasal cannula, but trying to wean to room air.  Patient normally not on any oxygenation. ? ?COPD exacerbation ([place]) ?As above.  Set off by rhinovirus/enterovirus.  Steroids plus nebs plus oxygen.  Procalcitonin during this hospitalization normal.  No antibiotics.  Sepsis ruled out. ? ?A-fib ([place]) ?New onset.  Being seen by cardiology.  Goes in and out.  On Cardizem and amiodarone and Lovenox for stroke prevention.  Given some Lasix earlier.  Echocardiogram from 3/4 notes no atrial enlargement.  Heart rate overall controlled ? ?Dysphagia ?No evidence of any neurological deficit.  Patient has been on a ventilator for some time and therefore, still weak.  Seen by speech therapy  and will remain n.p.o. at risk for silent aspiration.  Should improve over the next few days. ? ?Oral thrush ?Secondary to being on ventilator.  On IV Diflucan. ? ?Overweight (BMI 25.0-29.9) ?Meets criteria with BMI greater than 25 ? ?Tobacco abuse ?Counseled.  Declines nicotine patch ? ?Physical deconditioning ?Secondary to prolonged illness and being on the ventilator.  Seen by physical and Occupational Therapy who recommended inpatient rehab although inpatient rehab has concerns that patient will not be able to participate very much.  Patient encouraged to do so and hopefully he will participate and be excepted.  He has no insurance which would make skilled nursing difficult for him to place, but inpatient rehab would be able to take him. ? ? ? ? ? ? ?Body mass index is 29.48 kg/m?Marland Kitchen  ?Nutrition Problem: Inadequate oral intake ?Etiology: inability to eat (pt sedated and ventilated) ?   ? ?Consultants: ?Critical care ?Cardiology ? ?Procedures: ?Ventilator support 2/24 - 3/11 ?Echocardiogram: Preserved ejection fraction, no evidence of diastolic dysfunction. ? ?Antimicrobials: ?None ? ?Code Status: Full code ? ? ?Subjective: Left message for sister ? ?Objective: ?Vital signs were reviewed and unremarkable. ?Vitals:  ? 02/07/22 0805 02/07/22 1150  ?BP: (!) 103/59 107/66  ?Pulse: 81 85  ?Resp: 16 18  ?Temp: 98.2 ?F (36.8 ?C) 98.7 ?F (37.1 ?C)  ?SpO2: 95% 93%  ? ? ?Intake/Output Summary (Last 24 hours) at 02/07/2022 1423 ?Last data filed at 02/07/2022 [id number] ?Gross per 24 hour  ?Intake 146.32 ml  ?Output 1100 ml  ?Net -953.68 ml  ? ? ?Filed Weights  ? 01/28/22 0456 01/31/22 0500 02/03/22 0825  ?Weight: 102.2  kg 103 kg 98.6 kg  ? ?Body mass index is 29.48 kg/m?. ? ?Exam: ? ?General: Alert and oriented x3, no acute distress, fatigued ?HEENT: Normocephalic and atraumatic, mucous membranes slightly dry ?Cardiovascular: Irregular rhythm, rate controlled ?Respiratory: Decreased breath sounds throughout, prolonged expiratory  phase-improving from previous day ?Abdomen: Soft, nontender, nondistended, positive bowel sounds ?Musculoskeletal: No clubbing or cyanosis, trace pitting edema ?Skin: No skin breaks, tears or lesions ?Psychiatry: Appropriate, no evidence of psychoses ?Neurology: No focal deficits ? ?Data Reviewed: ?Labs for today are unremarkable ? ?Disposition:  ?Status is: Inpatient ?Remains inpatient appropriate because: Hopefully placed by inpatient rehab ?  ? ?Family Communication: Left message for sister ?DVT Prophylaxis: ?SCDs Start: 01/21/22 0901 ? ? ? ?Author: ?Annita Brod ,MD ?02/07/2022 2:23 PM ? ?To reach On-call, see care teams to locate the attending and reach out via [email or web address]. ?Between 7PM-7AM, please contact night-coverage ?If you still have difficulty reaching the attending provider, please page the [facility] (Director on Call) for [facility] on amion for assistance. ? ?

## 2022-02-07 NOTE — Procedures (Signed)
Objective Swallowing Evaluation: Type of Study: MBS-Modified Barium Swallow Study ?  ?Patient Details  ?Name: Kenneth Benson ?MRN: [id number] ?Date of Birth: 01-Jan-1961 ? ?Today's Date: 02/07/2022 ?Time: SLP Start Time (ACUTE ONLY): 0800 ?-SLP Stop Time (ACUTE ONLY): 0835 ? ?SLP Time Calculation (min) (ACUTE ONLY): 35 min ? ? ?Past Medical History:  ?Past Medical History:  ?Diagnosis Date  ? Asthma   ? Tobacco abuse   ? ?Past Surgical History: History reviewed. No pertinent surgical history. ?HPI: Per Physician's H&P on admission "This is a 61 yo male who presented to [facility] ER via EMS on 02/24 with c/o shortness of breath, cough, wheezing, and congestion onset of symptoms several days prior to presentation. He reported using his inhaler without relief of symptoms.  EMS reported pt severely hypoxic with O2 sats in the 60's.  He received duoneb's x2 and 125 mg of iv solumedrol en route to the ER.  All hx obtained from chart review pt currently sedated and mechanically intubated with no family at bedside." Pt intubated 2/24-3/11. ENT consulted for tracheostomy during this admission. ?  ?Subjective: Pt alert, pleasant, and cooperative. Mildly hoarse/hypophonic vocal quality observed. On 2L/min O2 via [place]. ? ? ? Recommendations for follow up therapy are one component of a multi-disciplinary discharge planning process, led by the attending physician.  Recommendations may be updated based on patient status, additional functional criteria and insurance authorization. ? ?Assessment / Plan / Recommendation ? ?Clinical Impressions 02/07/2022  ?Clinical Impression Pt seen for MBSS. Marked improvement in pt's vocal quality appreciated. Mildly hoarse/hypophonic vocal quality noted.  ? ?Pt given trials of thin liquids (via tsp, cup, and straw sip), pudding, and solid. ? ?Pt with a mild oropharyngeal dysphagia. Orally, pt with mildly prolonged mastication of solids and trace oral residual across textures like due to edentulism and  exacerbated by overall deconditioned state. Pharyngeal phase characterized by: swallow initiation at the vallecula with pudding, solid, and thin liquids via tsp and swallow initiation at the pyriform sinus with thin liquids via cup and straw; reduced base of tongue retraction resulting in mild vallecular stasis with pudding and solid which reduced/cleared with liquid wash; transient during the swallow penetration with thin liquids via cup and straw which cleared with laryngeal vestibule closure and is [facility] for age.  ? ?Recommend initiation of a mech soft diet with thin liquids and safe swallowing strategies/aspiration precautions as outlined below.  ? ?SLP to f/u with pt and care team with results, recommendations, and SLP POC.   ?SLP Visit Diagnosis Dysphagia, oropharyngeal phase (R13.12)  ?Attention and concentration deficit following --  ?Frontal lobe and executive function deficit following --  ?Impact on safety and function --  ? ?   ?Treatment Recommendations 02/07/2022  ?Treatment Recommendations Therapy as outlined in treatment plan below  ?   ?Prognosis 02/07/2022  ?Prognosis for Safe Diet Advancement Good  ?Barriers to Reach Goals Severity of deficits  ?Barriers/Prognosis Comment --  ? ? ?Diet Recommendations 02/07/2022  ?SLP Diet Recommendations Dysphagia 3 (Mech soft) solids;Thin liquid  ?Liquid Administration via --  ?Medication Administration Crushed with puree  ?Compensations Minimize environmental distractions;Slow rate;Small sips/bites;Follow solids with liquid  ?Postural Changes Remain semi-upright after after feeds/meals (Comment);Seated upright at 90 degrees  ?   ? ?Other Recommendations 02/07/2022  ?Recommended Consults Consider ENT evaluation  ?Oral Care Recommendations Oral care QID  ?Other Recommendations --  ?Follow Up Recommendations (No Data)  ?Assistance recommended at discharge Frequent or constant Supervision/Assistance  ?Functional Status Assessment Patient has had  a recent decline in their  functional status and demonstrates the ability to make significant improvements in function in a reasonable and predictable amount of time.  ? ? ?Frequency and Duration  02/07/2022  ?Speech Therapy Frequency (ACUTE ONLY) min 2x/week  ?Treatment Duration 2 weeks  ?   ? ?Oral Phase 02/07/2022  ?Oral Phase Impaired  ?Oral - Pudding Teaspoon --  ?Oral - Pudding Cup --  ?Oral - Honey Teaspoon --  ?Oral - Honey Cup --  ?Oral - Nectar Teaspoon --  ?Oral - Nectar Cup --  ?Oral - Nectar Straw --  ?Oral - Thin Teaspoon --  ?Oral - Thin Cup --  ?Oral - Thin Straw --  ?Oral - Puree --  ?Oral - Mech Soft --  ?Oral - Regular Impaired mastication  ?Oral - Multi-Consistency --  ?Oral - Pill --  ?Oral Phase - Comment --  ?  ?Pharyngeal Phase 02/07/2022  ?Pharyngeal Phase Impaired  ?Pharyngeal- Pudding Teaspoon --  ?Pharyngeal --  ?Pharyngeal- Pudding Cup --  ?Pharyngeal --  ?Pharyngeal- Honey Teaspoon --  ?Pharyngeal --  ?Pharyngeal- Honey Cup --  ?Pharyngeal --  ?Pharyngeal- Nectar Teaspoon --  ?Pharyngeal --  ?Pharyngeal- Nectar Cup --  ?Pharyngeal --  ?Pharyngeal- Nectar Straw --  ?Pharyngeal --  ?Pharyngeal- Thin Teaspoon Delayed swallow initiation-vallecula  ?Pharyngeal Material does not enter airway  ?Pharyngeal- Thin Cup Delayed swallow initiation-pyriform sinuses;Penetration/Aspiration during swallow  ?Pharyngeal Material enters airway, remains ABOVE vocal cords then ejected out;Material does not enter airway  ?Pharyngeal- Thin Straw Delayed swallow initiation-pyriform sinuses  ?Pharyngeal Material enters airway, remains ABOVE vocal cords then ejected out;Material does not enter airway  ?Pharyngeal- Puree Delayed swallow initiation-vallecula;Reduced tongue base retraction;Pharyngeal residue - valleculae  ?Pharyngeal Material does not enter airway  ?Pharyngeal- Mechanical Soft --  ?Pharyngeal --  ?Pharyngeal- Regular Delayed swallow initiation-vallecula;Reduced laryngeal elevation;Pharyngeal residue - valleculae  ?Pharyngeal --   ?Pharyngeal- Multi-consistency --  ?Pharyngeal --  ?Pharyngeal- Pill --  ?Pharyngeal --  ?Pharyngeal Comment --  ?  ? ?Cervical Esophageal Phase  02/07/2022  ?Cervical Esophageal Phase WFL  ?Pudding Teaspoon --  ?Pudding Cup --  ?Honey Teaspoon --  ?Honey Cup --  ?Nectar Teaspoon --  ?Nectar Cup --  ?Nectar Straw --  ?Thin Teaspoon --  ?Thin Cup --  ?Thin Straw --  ?Puree --  ?Mechanical Soft --  ?Regular --  ?Multi-consistency --  ?Pill --  ?Cervical Esophageal Comment --  ? ? ?Clyde Canterbury, M.S., CCC-SLP ?Speech-Language Pathologist ?[place] [facility] ?[phone])  ? ?Kenneth Benson ?02/07/2022, 9:24 AM ?  ? ?   ?   ?   ?   ?   ? ?  ? ?  ?  ?   ?  ? ? ?  ? ? ?

## 2022-02-07 NOTE — NC FL2 (Signed)
?[place] MEDICAID FL2 LEVEL OF CARE SCREENING TOOL  ?  ? ?IDENTIFICATION  ?Patient Name: ?Kenneth Benson Birthdate: 1961-05-20 Sex: male Admission Date (Current Location): ?01/21/2022  ?[place] and [place] Number: ? [place] ?  Facility and Address:  ?[facility], [address] [place], [place] [address] ?     Provider Number: ?[id number]  ?Attending Physician Name and Address:  ?Annita Brod, MD ? Relative Name and Phone Number:  ?  ?   ?Current Level of Care: ?Hospital Recommended Level of Care: ?[place] Prior Approval Number: ?  ? ?Date Approved/Denied: ?  PASRR Number: ?[id number] A ? ?Discharge Plan: ?SNF ?  ? ?Current Diagnoses: ?Patient Active Problem List  ? Diagnosis Date Noted  ? Overweight (BMI 25.0-29.9) 02/06/2022  ? A-fib ([place]) 02/06/2022  ? Dysphagia 02/06/2022  ? Arterial line in place   ? COPD exacerbation ([place]) 01/21/2022  ? Asthma exacerbation 01/21/2022  ? Tobacco abuse 01/21/2022  ? Acute respiratory failure with hypoxia and hypercapnia (HCC)   ? ? ?Orientation RESPIRATION BLADDER Height & Weight   ?  ?Self, Time, Situation, Place ? O2 ([place] 2L) Incontinent Weight: 217 lb 6 oz (98.6 kg) ?Height:  6' (182.9 cm)  ?BEHAVIORAL SYMPTOMS/MOOD NEUROLOGICAL BOWEL NUTRITION STATUS  ?    Incontinent Diet (DYS 3 diet)  ?AMBULATORY STATUS COMMUNICATION OF NEEDS Skin   ?Limited Assist Verbally Normal ?  ?  ?  ?    ?     ?     ? ? ?Personal Care Assistance Level of Assistance  ?Bathing, Feeding, Dressing Bathing Assistance: Limited assistance ?Feeding assistance: Limited assistance ?Dressing Assistance: Limited assistance ?   ? ?Functional Limitations Info  ?Sight, Hearing, Speech Sight Info: Adequate ?Hearing Info: Adequate ?Speech Info: Adequate  ? ? ?SPECIAL CARE FACTORS FREQUENCY  ?PT (By licensed PT), OT (By licensed OT)   ?  ?PT Frequency: 5x ?OT Frequency: 5x ?  ?  ?  ?   ? ? ?Contractures Contractures Info: Not present  ? ? ?Additional Factors Info   ?Code Status, Allergies Code Status Info: full code ?Allergies Info: no known allergies ?  ?  ?  ?   ? ?Current Medications (02/07/2022):  This is the current hospital active medication list ?Current Facility-Administered Medications  ?Medication Dose Route Frequency Provider Last Rate Last Admin  ? 0.9 %  sodium chloride infusion  250 mL Intravenous Continuous Annita Brod, MD 10 mL/hr at 02/07/22 0655 Infusion Verify at 02/07/22 0655  ? acetaminophen (TYLENOL) 160 MG/5ML solution 650 mg  650 mg Oral Q6H PRN Annita Brod, MD      ? albuterol (PROVENTIL) (2.5 MG/3ML) 0.083% nebulizer solution 2.5 mg  2.5 mg Nebulization Q4H PRN Annita Brod, MD   2.5 mg at 02/07/22 0054  ? amiodarone (PACERONE) tablet 200 mg  200 mg Oral BID Annita Brod, MD   200 mg at 02/07/22 0948  ? arformoterol (BROVANA) nebulizer solution 15 mcg  15 mcg Nebulization BID Annita Brod, MD   15 mcg at 02/06/22 1944  ? budesonide (PULMICORT) nebulizer solution 0.25 mg  0.25 mg Nebulization BID Annita Brod, MD   0.25 mg at 02/07/22 2637  ? chlorhexidine gluconate (MEDLINE KIT) (PERIDEX) 0.12 % solution 15 mL  15 mL Mouth Rinse BID Annita Brod, MD   15 mL at 02/07/22 0929  ? Chlorhexidine Gluconate Cloth 2 % PADS 6 each  6 each Topical Q0600 Annita Brod, MD  6 each at 02/07/22 7195  ? diltiazem (CARDIZEM) tablet 90 mg  90 mg Oral Q6H Annita Brod, MD   90 mg at 02/07/22 0547  ? docusate (COLACE) 50 MG/5ML liquid 100 mg  100 mg Oral BID PRN Annita Brod, MD      ? enoxaparin (LOVENOX) injection 100 mg  1 mg/kg Subcutaneous BID Annita Brod, MD   100 mg at 02/07/22 0948  ? feeding supplement (ENSURE ENLIVE / ENSURE PLUS) liquid 237 mL  237 mL Oral BID BM Annita Brod, MD      ? fluconazole (DIFLUCAN) IVPB 100 mg  100 mg Intravenous Q24H Annita Brod, MD 50 mL/hr at 02/07/22 0950 100 mg at 02/07/22 0950  ? glycopyrrolate (ROBINUL) injection 0.2 mg  0.2 mg Intravenous TID  Annita Brod, MD   0.2 mg at 02/07/22 9747  ? insulin aspart (novoLOG) injection 0-15 Units  0-15 Units Subcutaneous Q4H Annita Brod, MD   2 Units at 02/06/22 1645  ? ipratropium-albuterol (DUONEB) 0.5-2.5 (3) MG/3ML nebulizer solution 3 mL  3 mL Nebulization TID Annita Brod, MD   3 mL at 02/07/22 0711  ? metoprolol tartrate (LOPRESSOR) tablet 12.5 mg  12.5 mg Oral Q6H Annita Brod, MD   12.5 mg at 02/07/22 1855  ? multivitamin with minerals tablet 1 tablet  1 tablet Oral Daily Annita Brod, MD      ? nystatin (MYCOSTATIN) 100000 UNIT/ML suspension 500,000 Units  5 mL Mouth/Throat QID Annita Brod, MD   500,000 Units at 02/07/22 0158  ? ondansetron (ZOFRAN) injection 4 mg  4 mg Intravenous Q8H PRN Annita Brod, MD      ? potassium PHOSPHATE 15 mmol in dextrose 5 % 250 mL infusion  15 mmol Intravenous Once Darnelle Bos, RPH 43 mL/hr at 02/07/22 1054 15 mmol at 02/07/22 1054  ? predniSONE (DELTASONE) tablet 50 mg  50 mg Oral Q breakfast Annita Brod, MD   50 mg at 02/07/22 6825  ? sodium chloride flush (NS) 0.9 % injection 10-40 mL  10-40 mL Intracatheter Q12H Annita Brod, MD   10 mL at 02/07/22 7493  ? sodium chloride flush (NS) 0.9 % injection 10-40 mL  10-40 mL Intracatheter PRN Annita Brod, MD      ? sodium chloride flush (NS) 0.9 % injection 10-40 mL  10-40 mL Intracatheter Q12H Annita Brod, MD   10 mL at 02/07/22 5521  ? sodium chloride flush (NS) 0.9 % injection 10-40 mL  10-40 mL Intracatheter PRN Annita Brod, MD      ? ? ? ?Discharge Medications: ?Please see discharge summary for a list of discharge medications. ? ?Relevant Imaging Results: ? ?Relevant Lab Results: ? ? ?Additional Information ?SSN: [id number] ? ?Cristal Qadir A Matisse Roskelley, LCSW ? ? ? ? ?

## 2022-02-07 NOTE — Progress Notes (Signed)
[facility] ? ?Patient ID: ?Kenneth Benson ?MRN: [id number] ?DOB/AGE: 03/08/1961 61 y.o. ?  ?Admit date: 01/21/2022 ?Referring Physician Dr. Valora Piccolo ?Primary Physician  ?Primary Cardiologist  ?Reason for Consultation EKG changes ?  ?HPI: The patient is a 61 year old male with a past medical history notable for asthma, COPD, and current tobacco use who presented to [facility] ED in the early morning hours complaining of shortness of breath with worsening cough and congestion.  Per EMS reporting the patient's O2 sats were in the 60s and he was given 2 DuoNebs and Solu-Medrol on route.  He presented to the ED in moderate respiratory distress with diffuse wheezing and had chest tightness without chest pain.  He was given DuoNebs x3 and 2 g of IV mag and was subsequently placed on BiPAP and eventually required intubation due to worsening respiratory status.  Cardiology was consulted because of ventricular bigeminy, and then now persistent new onset atrial fibrillation.  He remains intubated on the ventilator. ? ?Interval History: ?- on room air this morning ?- in sinus with premature beats in a pattern of bigeminy.  ?- Denies chest pain. Still short of breath and continue to feel weak.  ? ? ?Vitals:  ? 02/07/22 0444 02/07/22 0711 02/07/22 0805 02/07/22 1150  ?BP: 115/67  (!) 103/59 107/66  ?Pulse: (!) 56  81 85  ?Resp: 18  16 18   ?Temp: 98.2 ?F (36.8 ?C)  98.2 ?F (36.8 ?C) 98.7 ?F (37.1 ?C)  ?TempSrc:      ?SpO2: 95% 95% 95% 93%  ?Weight:      ?Height:      ? ? ? ?Intake/Output Summary (Last 24 hours) at 02/07/2022 1232 ?Last data filed at 02/07/2022 [id number] ?Gross per 24 hour  ?Intake 305.51 ml  ?Output 1400 ml  ?Net -1094.49 ml  ? ? ? ?PHYSICAL EXAM ?General: ill-appearing middle-aged Caucasian male, sitting upright in hospital bed ?HEENT:  Normocephalic and atraumatic. ?Neck:   No JVD.  ?Lungs: normal work of breathing on room air, no crackles or wheezes anteriorly  ?Heart: tachy with frequent premature beats Normal S1 and  S2 without gallops or murmurs. Radial & DP pulses 2+ bilaterally. ?Abdomen: Obese appearing ?Msk: Normal tone for age. ?Extremities: Warm and well perfused. No clubbing, cyanosis.   ?Neuro: Alert and oriented x 3  ?Psych: mood appropriate for situation.  ? ? ?LABS: ?Basic Metabolic Panel: ?Recent Labs  ?  02/06/22 ?0400 02/07/22 ?[id number]  ?NA 138 137  ?K 3.6 3.5  ?CL 104 104  ?CO2 27 27  ?GLUCOSE 112* 90  ?BUN 30* 28*  ?CREATININE 0.45* 0.57*  ?CALCIUM 8.1* 7.9*  ?MG 1.8 2.1  ?PHOS 1.9* 2.3*  ? ? ?Liver Function Tests: ?No results for input(s): AST, ALT, ALKPHOS, BILITOT, PROT, ALBUMIN in the last 72 hours. ? ?No results for input(s): LIPASE, AMYLASE in the last 72 hours. ?CBC: ?Recent Labs  ?  02/05/22 ?0505 02/06/22 ?0400 02/07/22 ?[id number]  ?WBC 10.1 11.1* 10.5  ?NEUTROABS 7.7 9.0*  --   ?HGB 13.8 13.8 14.2  ?HCT 44.6 43.7 41.9  ?MCV 96.7 94.8 92.9  ?PLT 205 232 297  ? ? ?Cardiac Enzymes: ?No results for input(s): CKTOTAL, CKMB, CKMBINDEX, TROPONINI in the last 72 hours. ?BNP: ?Invalid input(s): POCBNP ?D-Dimer: ?No results for input(s): DDIMER in the last 72 hours. ?Hemoglobin A1C: ?No results for input(s): HGBA1C in the last 72 hours. ?Fasting Lipid Panel: ?No results for input(s): CHOL, HDL, LDLCALC, TRIG, CHOLHDL, LDLDIRECT in the last 72 hours. ? ?Thyroid  Function Tests: ?No results for input(s): TSH, T4TOTAL, T3FREE, THYROIDAB in the last 72 hours. ? ?Invalid input(s): FREET3 ? ?Anemia Panel: ?No results for input(s): VITAMINB12, FOLATE, FERRITIN, TIBC, IRON, RETICCTPCT in the last 72 hours. ? ?No results found. ? ? ?Echo LVEF 60-65% ? ?TELEMETRY: sinus rhythm with ventricular bigeminy, rate 100-110s ? ?ASSESSMENT AND PLAN: ? ?Principal Problem: ?  Asthma exacerbation ?Active Problems: ?  COPD exacerbation ([place]) ?  Tobacco abuse ?  Acute respiratory failure with hypoxia and hypercapnia (HCC) ?  Overweight (BMI 25.0-29.9) ?  A-fib ([place]) ?  Dysphagia ?  ? ?1. Atrial fibrillation with RVR, compensatory for underlying  respiratory failure, on Lovenox for stroke prevention. In NSR 3/9, but recurrent on 3/10. ?2.  Frequent PVCs of uncertain clinical significance, normal left ventricular function ?3.  Respiratory failure / asthma / COPD exacerbation / severe ARDS / pansinusitis extubated 02/05/22.  ? ?Recommendations ?  ?1.  Agree with current therapy per PCCM ?2.  received roughly 9g of amiodarone IV since admission, continue 200mg  BID, continue diltiazem 90mg  q6h. metoprolol 12.5 q6h; uptitration limited by low SBP (low 100s)  ?3.  Continue Lovenox for stroke prevention ?4.  Hold on lasix. None given since 02/03/22.  ?5.  Defer further cardiac diagnostics at this time ? ?This patient's plan of care was discussed and created with Dr. Serafina Royals and he is in agreement.   ? ?Tristan Schroeder, PA-C ?02/07/2022 ?12:32 PM ? ? ? ?  ?

## 2022-02-07 NOTE — Progress Notes (Signed)
Physical Therapy Treatment ?Patient Details ?Name: Kenneth Benson ?MRN: [id number] ?DOB: 05-Feb-1961 ?Today's Date: 02/07/2022 ? ? ?History of Present Illness Pt admitted for asthma exacerbation with complaints of SOB and cough. Pt with complicated hospital stay with intubation 2/24-3/11. ? ?  ?PT Comments  ? ? Pt is making gradual progress towards goals with ability to sit at EOB for prolonged session this date. Pt very motivated and recalls HEP given by OT over the weekend. He has a goal to feed himself, updated CNA notifying them that he will need assist as breakfast tray is untouched. While seated at EOB, pt able to perform seated there-ex with varying levels of physical assist. O2 removed at beginning of session with sats at 88%, 2L reapplied and sats improved to 92%. Noted R foot drop, positioned at end of session and heavy education to patient given. Pt still present with foley and rectal tubes. Pt has decreased distal strength vs proximal. Updating recs to CIR as pt was very indep prior to admission and is now showing he can tolerate and make progress quickly. Will update care team. Will continue to follow.  ?Recommendations for follow up therapy are one component of a multi-disciplinary discharge planning process, led by the attending physician.  Recommendations may be updated based on patient status, additional functional criteria and insurance authorization. ? ?Follow Up Recommendations ? Acute inpatient rehab (3hours/day) ?  ?  ?Assistance Recommended at Discharge Frequent or constant Supervision/Assistance  ?Patient can return home with the following Two people to help with walking and/or transfers;Two people to help with bathing/dressing/bathroom ?  ?Equipment Recommendations ?  (TBD)  ?  ?Recommendations for Other Services   ? ? ?  ?Precautions / Restrictions Precautions ?Precautions: Fall ?Restrictions ?Weight Bearing Restrictions: No  ?  ? ?Mobility ? Bed Mobility ?Overal bed mobility: Needs  Assistance ?Bed Mobility: Supine to Sit, Sit to Supine ?  ?  ?Supine to sit: Max assist ?Sit to supine: Max assist ?  ?General bed mobility comments: able to initiate sliding B LE off bed, however needs max assist for trunkal elevation. Once seated, needs min assist to remain upright with periods of static sitting with supervision. With dual tasking, needs more physical assist. Able to maintain seated balance for majority of treatment time prior to fatigue and requesting to return back supine. Max assist performed and assist for repositioning. Left with bed in chair position. ?  ? ?Transfers ?  ?  ?  ?  ?  ?  ?  ?  ?  ?General transfer comment: unable to perform this date. Anticipate need for +2 assist for further mobility ?  ? ?Ambulation/Gait ?  ?  ?  ?  ?  ?  ?  ?General Gait Details: unable ? ? ?Stairs ?  ?  ?  ?  ?  ? ? ?Wheelchair Mobility ?  ? ?Modified Rankin (Stroke Patients Only) ?  ? ? ?  ?Balance Overall balance assessment: Needs assistance ?Sitting-balance support: Bilateral upper extremity supported ?Sitting balance-Leahy Scale: Poor ?  ?  ?  ?  ?  ?  ?  ?  ?  ?  ?  ?  ?  ?  ?  ?  ?  ? ?  ?Cognition Arousal/Alertness: Awake/alert ?Behavior During Therapy: Flat affect ?Overall Cognitive Status: Impaired/Different from baseline ?  ?  ?  ?  ?  ?  ?  ?  ?  ?  ?  ?  ?  ?  ?  ?  ?  General Comments: pleasant, very soft spoken. more alert this date, however still remains confused to situation. Able to follow commands ?  ?  ? ?  ?Exercises Other Exercises ?Other Exercises: supine ther-ex on B LE including LAQ, SLRs, AP, hip add squeezes, and scap squeezes. 10 reps performed with min/mod assist. Attempted B UE reaching forward and above shoulder and unable to perform without max assist. Unable to perform seated marching. ?Other Exercises: Able to sit at EOB and attempt to bring cup of cranberry juice up to his mouth. Mod/max assist. ? ?  ?General Comments   ?  ?  ? ?Pertinent Vitals/Pain Pain Assessment ?Pain  Assessment: No/denies pain  ? ? ?Home Living   ?  ?  ?  ?  ?  ?  ?  ?  ?  ?   ?  ?Prior Function    ?  ?  ?   ? ?PT Goals (current goals can now be found in the care plan section) Acute Rehab PT Goals ?Patient Stated Goal: to get stronger and feed himself ?PT Goal Formulation: With patient ?Time For Goal Achievement: 02/19/22 ?Potential to Achieve Goals: Fair ?Progress towards PT goals: Progressing toward goals ? ?  ?Frequency ? ? ? Min 2X/week ? ? ? ?  ?PT Plan Discharge plan needs to be updated  ? ? ?Co-evaluation   ?  ?  ?  ?  ? ?  ?AM-PAC PT "6 Clicks" Mobility   ?Outcome Measure ? Help needed turning from your back to your side while in a flat bed without using bedrails?: A Lot ?Help needed moving from lying on your back to sitting on the side of a flat bed without using bedrails?: A Lot ?Help needed moving to and from a bed to a chair (including a wheelchair)?: Total ?Help needed standing up from a chair using your arms (e.g., wheelchair or bedside chair)?: Total ?Help needed to walk in hospital room?: Total ?Help needed climbing 3-5 steps with a railing? : Total ?6 Click Score: 8 ? ?  ?End of Session Equipment Utilized During Treatment: Oxygen ?Activity Tolerance: Patient tolerated treatment well ?Patient left: in bed;with bed alarm set ?Nurse Communication: Mobility status ?PT Visit Diagnosis: Muscle weakness (generalized) (M62.81);Difficulty in walking, not elsewhere classified (R26.2) ?  ? ? ?Time: 1130-1200 ?PT Time Calculation (min) (ACUTE ONLY): 30 min ? ?Charges:  $Therapeutic Exercise: 8-22 mins ?$Therapeutic Activity: 8-22 mins          ?          ? ?Kenneth Benson, PT, DPT, GCS ?[phone] ? ? ? ?Kenneth Benson ?02/07/2022, 1:23 PM ? ?

## 2022-02-07 NOTE — Assessment & Plan Note (Signed)
Secondary to prolonged illness and being on the ventilator.  Seen by physical and Occupational Therapy who recommended inpatient rehab although inpatient rehab has concerns that patient will not be able to participate very much.  Patient encouraged to do so and hopefully he will participate and be excepted.  He has no insurance which would make skilled nursing difficult for him to place, but inpatient rehab would be able to take him.

## 2022-02-07 NOTE — Assessment & Plan Note (Addendum)
Secondary to being on ventilator.  On IV Diflucan.

## 2022-02-07 NOTE — Progress Notes (Addendum)
Speech Language Pathology Treatment:    ?Patient Details ?Name: Kenneth Benson ?MRN: [id number] ?DOB: 04/18/1961 ?Today's Date: 02/07/2022 ?Time: 2595-6387 ?SLP Time Calculation (min) (ACUTE ONLY): 15 min ? ?Assessment / Plan / Recommendation ?Clinical Impression ? Pt seen for follow up education following MBSS. MBSS completed today which revealed pt with a mild oropharyngeal dysphagia.  ? ?Based on MBSS results, recommend initiation of a mech soft diet with thin liquids and safe swallowing strategies/aspiration precautions outlined below: ?Minimize environmental distractions ?Slow rate ?Small sips/bites ?Follow solids with liquid ?Out of bed for meals, if possible  ?Seated upright 90 degrees ?Upright 30-60 min after meal ?1:1 assistance with feeding due to BUE weakness ?Meds crushed with applesauce/pudding ? ?Reviewed results of MBSS, diet recommendations, safe swallowing strategies/aspiration precautions, rationale for diet recommendations/safe swallowing strategies/aspiration precautions, and SLP POC with pt. Pt verbalized understanding/agreement. Communication board in pt's room updated with diet recommendations and safe swallowing strategies/aspiration precautions.  ? ?RN and MD made aware of results, recommendations, and SLP POC.  ? ?SLP to f/u per POC for dysphagia management including diet tolerance and trials of upgraded textures, as appropraite.  ?  ?HPI HPI: Per Physician's H&P on admission "This is a 61 yo male who presented to [facility] ER via EMS on 02/24 with c/o shortness of breath, cough, wheezing, and congestion onset of symptoms several days prior to presentation. He reported using his inhaler without relief of symptoms.  EMS reported pt severely hypoxic with O2 sats in the 60's.  He received duoneb's x2 and 125 mg of iv solumedrol en route to the ER.  All hx obtained from chart review pt currently sedated and mechanically intubated with no family at bedside." Pt intubated 2/24-3/11. ENT consulted for  tracheostomy during this admission. ?  ?   ?SLP Plan ? Continue with current plan of care ? ?  ?  ?Recommendations for follow up therapy are one component of a multi-disciplinary discharge planning process, led by the attending physician.  Recommendations may be updated based on patient status, additional functional criteria and insurance authorization. ?  ? ?Recommendations  ?Diet recommendations: Dysphagia 3 (mechanical soft);Thin liquid ?Medication Administration: Crushed with puree ?Supervision: Trained caregiver to feed patient;Intermittent supervision to cue for compensatory strategies ?Compensations: Minimize environmental distractions;Slow rate;Small sips/bites;Follow solids with liquid ?Postural Changes and/or Swallow Maneuvers: Out of bed for meals;Seated upright 90 degrees;Upright 30-60 min after meal  ?   ?    ?   ? ? ? ? General recommendations:  (consider ENT consult closer to d/c due to dysphonia) ?Oral Care Recommendations: Oral care QID ?Follow Up Recommendations:  (TBD; likely no f/u for swallowing) ?Assistance recommended at discharge: Frequent or constant Supervision/Assistance ?SLP Visit Diagnosis: Dysphagia, oropharyngeal phase (R13.12) ?Plan: Continue with current plan of care ? ? ? ? ?  ?  ? ?Clyde Canterbury, M.S., CCC-SLP ?Speech-Language Pathologist ?[facility] ?[phone] ([phone])  ? ?Kenneth Benson ? ?02/07/2022, 9:33 AM ?

## 2022-02-07 NOTE — Progress Notes (Signed)
Inpatient Rehabilitation Admissions Coordinator  ? ?Rehab consult received. Patient currently not at a functional level for CIR admit. He will have to have improved tolerance for more intensive therapies to be considered. I contacted SW, Clarisse Gouge and discussed. I will follow his progress over the next few days to see if his activity tolerance improves. Please call me with any questions. ? ?Ottie Glazier, RN, MSN ?Rehab Admissions Coordinator ?[phone] ?02/07/2022 12:24 PM ? ?

## 2022-02-07 NOTE — Progress Notes (Signed)
Nutrition Follow-up ? ?DOCUMENTATION CODES:  ? ?Obesity unspecified ? ?INTERVENTION:  ? ?-D/c nutrisource fiber ?-Ensure Enlive po BID, each supplement provides 350 kcal and 20 grams of protein.  ?-MVI with minerals daily ? ?NUTRITION DIAGNOSIS:  ? ?Inadequate oral intake related to inability to eat (pt sedated and ventilated) as evidenced by NPO status. ? ?Progressing; advanced to PO diet on 02/07/22 ? ?GOAL:  ? ?Patient will meet greater than or equal to 90% of their needs ? ?Progressing  ? ?MONITOR:  ? ?PO intake, Supplement acceptance, Diet advancement, Labs, Weight trends, Skin, I & O's ? ?REASON FOR ASSESSMENT:  ? ?Ventilator ?  ? ?ASSESSMENT:  ? ?61 y/o male with h/o asthma, COPD and currently smoker who is admitted with severe COPD exacerbation. ? ?3/11- extubated ?3/12- s/p BSE- recommend continued NPO ?3/13- s/p MBSS- advanced to dysphagia 3 diet with thin liquids ? ?Reviewed I/O's: -979 ml x 24 hours and -6.6 L since 01/24/22 ? ?UOP: 1.7 L x 24 hours ? ?Rectal tube output: 100 ml x 24 hours ? ?Pt unavailable at time of visit. Attempted to speak with pt via call to hospital room phone, however, unable to reach.   ? ?Pt just advanced to dysphagia 3 diet with thin liquids per MBSS this AM. No meal completion data currently available to assess at this time.   ? ?Medications reviewed and include cardizem.  ? ?Labs reviewed: CBGS: 92-123 (inpatient orders for glycemic control are 0-15 units insulin aspart every 4 hours).   ? ?Diet Order:   ?Diet Order   ? ?       ?  DIET DYS 3 Room service appropriate? Yes; Fluid consistency: Thin  Diet effective now       ?  ? ?  ?  ? ?  ? ? ?EDUCATION NEEDS:  ? ?No education needs have been identified at this time ? ?Skin:  Skin Assessment: Reviewed RN Assessment ? ?Last BM:  02/06/22 (via rectal tube) ? ?Height:  ? ?Ht Readings from Last 1 Encounters:  ?02/03/22 6' (1.829 m)  ? ? ?Weight:  ? ?Wt Readings from Last 1 Encounters:  ?02/03/22 98.6 kg  ? ? ?Ideal Body Weight:  80.9  kg ? ?BMI:  Body mass index is 29.48 kg/m?. ? ?Estimated Nutritional Needs:  ? ?Kcal:  2250-2450 ? ?Protein:  120-135 grams ? ?Fluid:  > 2 L ? ? ? ?Levada Schilling, RD, LDN, CDCES ?Registered Dietitian II ?Certified Diabetes Care and Education Specialist ?Please refer to [facility] for RD and/or RD on-call/weekend/after hours pager  ?

## 2022-02-07 NOTE — Consult Note (Signed)
PHARMACY CONSULT NOTE ? ?Pharmacy Consult for Electrolyte Monitoring and Replacement  ? ?Recent Labs: ?Potassium (mmol/L)  ?Date Value  ?02/07/2022 3.5  ? ?Magnesium (mg/dL)  ?Date Value  ?02/07/2022 2.1  ? ?Calcium (mg/dL)  ?Date Value  ?02/07/2022 7.9 (L)  ? ?Albumin (g/dL)  ?Date Value  ?01/29/2022 2.8 (L)  ? ?Phosphorus (mg/dL)  ?Date Value  ?02/07/2022 2.3 (L)  ? ?Sodium (mmol/L)  ?Date Value  ?02/07/2022 137  ? ?Corrected Ca: 8.9 mg/dL ? ?Assessment: ?Patient is a 61 y/o M with medical history including COPD / asthma and tobacco abuse who presented to the ED 2/24 with acute respiratory failure secondary to asthma / COPD exacerbation. Patient ultimately required intubation. Patient extubated on 3/11. Pharmacy consulted to assist with electrolyte monitoring and replacement as indicated. On amiodarone.  ? ?Goal of Therapy:  ?Potassium 4.0 - 5.1 mmol/L ?Magnesium 2.0 - 2.4 mg/dL ?All Other Electrolytes within normal limits ? ?Plan:  ?Kphos 15 mmol IV x1  ?F/u with AM labs  ? ?Derrek Gu, PharmD  ?Clinical Pharmacist ?02/07/2022 8:13 AM ? ? ? ? ?

## 2022-02-08 ENCOUNTER — Encounter
Admission: EM | Disposition: A | Discharge status: Home / Self Care | Payer: Self-pay | Source: Home / Self Care | Attending: Pulmonary Disease

## 2022-02-08 LAB — BASIC METABOLIC PANEL
Anion gap: 5 (ref 5–15)
BUN: 24 mg/dL — ABNORMAL HIGH (ref 6–20)
CO2: 25 mmol/L (ref 22–32)
Calcium: 8.1 mg/dL — ABNORMAL LOW (ref 8.9–10.3)
Chloride: 107 mmol/L (ref 98–111)
Creatinine, Ser: 0.6 mg/dL — ABNORMAL LOW (ref 0.61–1.24)
GFR, Estimated: >60 mL/min (ref >60)
Glucose, Bld: 87 mg/dL (ref 70–99)
Potassium: 3.5 mmol/L (ref 3.5–5.1)
Sodium: 137 mmol/L (ref 135–145)

## 2022-02-08 LAB — GLUCOSE, CAPILLARY
Glucose-Capillary: 81 mg/dL (ref 70–99)
Glucose-Capillary: 86 mg/dL (ref 70–99)

## 2022-02-08 LAB — MAGNESIUM: Magnesium: 2 mg/dL (ref 1.7–2.4)

## 2022-02-08 LAB — PHOSPHORUS: Phosphorus: 2.4 mg/dL — ABNORMAL LOW (ref 2.5–4.6)

## 2022-02-08 SURGERY — CREATION, TRACHEOSTOMY
Anesthesia: General

## 2022-02-08 MED ORDER — IPRATROPIUM-ALBUTEROL 0.5-2.5 (3) MG/3ML IN SOLN
3.0000 mL | Freq: Two times a day (BID) | RESPIRATORY_TRACT | Status: DC
  Administered 2022-02-08 – 2022-02-12 (×7): 3 mL via RESPIRATORY_TRACT
  Filled 2022-02-08 (×7): qty 3

## 2022-02-08 MED ORDER — METOPROLOL TARTRATE 50 MG PO TABS
50.0000 mg | ORAL_TABLET | Freq: Two times a day (BID) | ORAL | Status: DC
Start: 2022-02-08 — End: 2022-02-10
  Administered 2022-02-08 – 2022-02-09 (×3): 50 mg via ORAL
  Filled 2022-02-08 (×4): qty 1

## 2022-02-08 MED ORDER — PREDNISONE 20 MG PO TABS
30.0000 mg | ORAL_TABLET | Freq: Every day | ORAL | Status: DC
  Administered 2022-02-09 – 2022-02-11 (×3): 30 mg via ORAL
  Filled 2022-02-08 (×3): qty 1

## 2022-02-08 NOTE — Progress Notes (Signed)
Notified Bishop Limbo, NP of heart rate: 48. Instructed by this provider to hold pending dose of metoprolol, and upcoming dose of diltiazem.  ?

## 2022-02-08 NOTE — Progress Notes (Signed)
Physical Therapy Treatment ?Patient Details ?Name: Kenneth Benson ?MRN: [id number] ?DOB: 10-02-1961 ?Today's Date: 02/08/2022 ? ? ?History of Present Illness Pt admitted for asthma exacerbation with complaints of SOB and cough. Pt with complicated hospital stay with intubation 2/24-3/11. ? ?  ?PT Comments  ? ? Pt is making good progress towards goals and is very motivated to participate. Able to tolerate 2 separate extended sessions of therapy this date. +2 for standing at EOB with heavy use of B UE on RW. Not safe for gait, however pre gait activities performed. Good tolerance for HEP in supine/seated position. Cues for continued performance when therapist not in room. Will continue to progress as able. Great canidiate for CIR.  ?Recommendations for follow up therapy are one component of a multi-disciplinary discharge planning process, led by the attending physician.  Recommendations may be updated based on patient status, additional functional criteria and insurance authorization. ? ?Follow Up Recommendations ? Acute inpatient rehab (3hours/day) ?  ?  ?Assistance Recommended at Discharge Frequent or constant Supervision/Assistance  ?Patient can return home with the following Two people to help with walking and/or transfers;Two people to help with bathing/dressing/bathroom ?  ?Equipment Recommendations ?  (TBD)  ?  ?Recommendations for Other Services   ? ? ?  ?Precautions / Restrictions Precautions ?Precautions: Fall ?Restrictions ?Weight Bearing Restrictions: No  ?  ? ?Mobility ? Bed Mobility ?Overal bed mobility: Needs Assistance ?Bed Mobility: Supine to Sit ?  ?  ?Supine to sit: Mod assist ?Sit to supine: Mod assist ?  ?General bed mobility comments: needs assist for B LEs and trunkal elevation. Once seated at EOB, able to prop with B UE and maintain seated balance with cga. Able to maintain seated balance for extended time this session ?  ? ?Transfers ?Overall transfer level: Needs assistance ?Equipment used:  Rolling walker (2 wheels) ?Transfers: Sit to/from Stand ?Sit to Stand: Max assist, +2 physical assistance, From elevated surface ?  ?  ?  ?  ?  ?General transfer comment: able to perform 3 reps of sit<>Stand beginning with max A +2 and improving to mod A +2. Tolerated standing x 10-20 seconds each rep ?  ? ?Ambulation/Gait ?  ?  ?  ?  ?  ?  ?  ?General Gait Details: unsafe/unable at this time ? ? ?Stairs ?  ?  ?  ?  ?  ? ? ?Wheelchair Mobility ?  ? ?Modified Rankin (Stroke Patients Only) ?  ? ? ?  ?Balance Overall balance assessment: Needs assistance ?Sitting-balance support: Bilateral upper extremity supported ?Sitting balance-Leahy Scale: Fair ?  ?  ?Standing balance support: Bilateral upper extremity supported ?Standing balance-Leahy Scale: Fair ?  ?  ?  ?  ?  ?  ?  ?  ?  ?  ?  ?  ?  ? ?  ?Cognition Arousal/Alertness: Awake/alert ?Behavior During Therapy: [facility] for tasks assessed/performed ?Overall Cognitive Status: Within Functional Limits for tasks assessed ?  ?  ?  ?  ?  ?  ?  ?  ?  ?  ?  ?  ?  ?  ?  ?  ?General Comments: alert, oriented and motivated to participate ?  ?  ? ?  ?Exercises Other Exercises ?Other Exercises: supine/seated ther-ex on B UE/LE including AP, gastroc stretch, SLRs, hip abd/add, heel slides, LAQ, resisted hamstring, and B UE forward reaching for walker touches. 10 reps with rest breaks as needed. RR increased to 35 with exercise ? ?  ?General Comments   ?  ?  ? ?  Pertinent Vitals/Pain Pain Assessment ?Pain Assessment: No/denies pain  ? ? ?Home Living   ?  ?  ?  ?  ?  ?  ?  ?  ?  ?   ?  ?Prior Function    ?  ?  ?   ? ?PT Goals (current goals can now be found in the care plan section) Acute Rehab PT Goals ?Patient Stated Goal: to get stronger and feed himself ?PT Goal Formulation: With patient ?Time For Goal Achievement: 02/19/22 ?Potential to Achieve Goals: Fair ?Progress towards PT goals: Progressing toward goals ? ?  ?Frequency ? ? ? Min 2X/week ? ? ? ?  ?PT Plan Current plan remains  appropriate  ? ? ?Co-evaluation   ?  ?  ?  ?  ? ?  ?AM-PAC PT "6 Clicks" Mobility   ?Outcome Measure ? Help needed turning from your back to your side while in a flat bed without using bedrails?: A Lot ?Help needed moving from lying on your back to sitting on the side of a flat bed without using bedrails?: A Lot ?Help needed moving to and from a bed to a chair (including a wheelchair)?: Total ?Help needed standing up from a chair using your arms (e.g., wheelchair or bedside chair)?: A Lot ?Help needed to walk in hospital room?: Total ?Help needed climbing 3-5 steps with a railing? : Total ?6 Click Score: 9 ? ?  ?End of Session Equipment Utilized During Treatment: Gait belt ?Activity Tolerance: Patient tolerated treatment well ?Patient left: in bed;with bed alarm set ?Nurse Communication: Mobility status ?PT Visit Diagnosis: Muscle weakness (generalized) (M62.81);Difficulty in walking, not elsewhere classified (R26.2) ?  ? ? ?Time: 8466-5993 ?PT Time Calculation (min) (ACUTE ONLY): 40 min ? ?Charges:  $Therapeutic Exercise: 23-37 mins ?$Therapeutic Activity: 8-22 mins          ?          ? ?Elizabeth Palau, PT, DPT, GCS ?[phone] ? ? ? ?Giannina Bartolome ?02/08/2022, 4:52 PM ? ?

## 2022-02-08 NOTE — Progress Notes (Signed)
SLP Contact Note ? ?Patient Details ?Name: Kenneth Benson ?MRN: [id number] ?DOB: [birth year] ? ? ?Discussed pt with nsg; she reports he is tolerating current diet of dysphagia 3/thin liquids, refused to take pills with puree this morning but tolerated them whole with liquid without difficulty. Voice improving but remains mildly hoarse. Continue current diet, with SLP to follow up for possible advancement of solids Thursday, or reconsult SLP if difficulties noted prior to reassessment. ? ?Rondel Baton, MS, CCC-SLP ?Speech-Language Pathologist ?[phone] ? ? ?Arlana Lindau ?02/08/2022, 5:09 PM ? ? ?

## 2022-02-08 NOTE — Progress Notes (Signed)
[facility] Progress Note ? ?Patient: Kenneth Benson    WGY:[id number]  DOA: 01/21/2022    ?Date of Service: the patient was seen and examined on 02/08/2022 ? ?Brief hospital course: ?Patient is a 61 year old male past medical history of COPD, asthma and ongoing tobacco abuse who presented to the emergency room on 2/24 with shortness of breath and found to be in significant respiratory distress.  Initially placed on BiPAP but but started to decline regardless and ended up being placed on a ventilator and admitted to the critical care service. ? ?Hospital course complicated by development of atrial fibrillation.  Cardiology consulted and patient placed on Cardizem and eventually amiodarone.  Required diuresis.  Finally after multiple attempts to wean off of ventilator, able to be fully extubated by 3/11.  Transferred to hospitalist service starting 3/12. ? ?Assessment and Plan: ?Assessment and Plan: ?* Asthma exacerbation ?As above.  Set off by rhinovirus/enterovirus.  Continue nebulizers.  Tapering steroids ? ?Acute respiratory failure with hypoxia and hypercapnia (HCC) ?Secondary COPD and asthma exacerbations.  Able to be weaned off of ventilator.  Solu-Medrol changed over to p.o. prednisone and tapering.  Continue nebulizers.  Able to be weaned to room air.  Patient normally not on any oxygenation. ? ?COPD exacerbation (HCC) ?As above.  Set off by rhinovirus/enterovirus.  Steroids plus nebs plus oxygen.  Procalcitonin during this hospitalization normal.  No antibiotics.  Sepsis ruled out. ? ?A-fib (HCC) ?New onset.  Being seen by cardiology.  Goes in and out.  On Cardizem and amiodarone and Lovenox for stroke prevention.  Given some Lasix earlier.  Echocardiogram from 3/4 notes no atrial enlargement.  Frequent episodes of bradycardia.  We will DC Cardizem and change metoprolol to twice daily with an increased dose. ? ?Dysphagia ?No evidence of any neurological deficit.  Patient has been on a ventilator for some  time and therefore, still weak.  Seen by speech therapy and will remain n.p.o. at risk for silent aspiration.  Should improve over the next few days. ? ?Oral thrush ?Secondary to being on ventilator.  On IV Diflucan. ? ?Physical deconditioning ?Secondary to prolonged illness and being on the ventilator.  Seen by physical and Occupational Therapy who recommended inpatient rehab although inpatient rehab has concerns that patient will not be able to participate very much.  Patient encouraged to do so and hopefully he will participate and be excepted.  He has no insurance which would make skilled nursing difficult for him to place, but inpatient rehab would be able to take him. ? ?Overweight (BMI 25.0-29.9) ?Meets criteria with BMI greater than 25 ? ?Tobacco abuse ?Counseled.  Declines nicotine patch ? ? ? ? ? ? ?Body mass index is 29.48 kg/m?Marland Kitchen  ?Nutrition Problem: Inadequate oral intake ?Etiology: inability to eat (pt sedated and ventilated) ?   ? ?Consultants: ?Critical care ?Cardiology ? ?Procedures: ?Ventilator support 2/24 - 3/11 ?Echocardiogram: Preserved ejection fraction, no evidence of diastolic dysfunction. ? ?Antimicrobials: ?None ? ?Code Status: Full code ? ? ?Subjective: Left message for sister ? ?Objective: ?Vital signs were reviewed and unremarkable. ?Vitals:  ? 02/08/22 0837 02/08/22 0911  ?BP:  101/60  ?Pulse: 81 (!) 47  ?Resp: (!) 22 19  ?Temp:  97.8 ?F (36.6 ?C)  ?SpO2:  94%  ? ? ?Intake/Output Summary (Last 24 hours) at 02/08/2022 1353 ?Last data filed at 02/08/2022 0137 ?Gross per 24 hour  ?Intake --  ?Output 1000 ml  ?Net -1000 ml  ? ? ?Filed Weights  ? 01/28/22 0456  01/31/22 0500 02/03/22 0825  ?Weight: 102.2 kg 103 kg 98.6 kg  ? ?Body mass index is 29.48 kg/m?. ? ?Exam: ? ?General: Alert and oriented x3, no acute distress, fatigued ?HEENT: Normocephalic and atraumatic, mucous membranes slightly dry ?Cardiovascular: Irregular rhythm, rate controlled ?Respiratory: Decreased breath sounds throughout,  prolonged expiratory phase-improving from previous day ?Abdomen: Soft, nontender, nondistended, positive bowel sounds ?Musculoskeletal: No clubbing or cyanosis, trace pitting edema ?Skin: No skin breaks, tears or lesions ?Psychiatry: Appropriate, no evidence of psychoses ?Neurology: No focal deficits ? ?Data Reviewed: ?Labs for today are unremarkable ? ?Disposition:  ?Status is: Inpatient ?Remains inpatient appropriate because: Hopefully placed by inpatient rehab ?  ? ?Family Communication: Left message for sister ?DVT Prophylaxis: ?SCDs Start: 01/21/22 0901 ? ? ? ?Author: ?Hollice Espy ,MD ?02/08/2022 1:53 PM ? ?To reach On-call, see care teams to locate the attending and reach out via [email or web address]. ?Between 7PM-7AM, please contact night-coverage ?If you still have difficulty reaching the attending provider, please page the [facility] (Director on Call) for [facility] on amion for assistance. ? ?

## 2022-02-08 NOTE — Progress Notes (Signed)
[facility] Cardiology ? ?Patient ID: ?Kenneth Benson ?MRN: [id number] ?DOB/AGE: 01/18/1961 61 y.o. ?  ?Admit date: 01/21/2022 ?Referring Physician Dr. Donna Bernard ?Primary Physician  ?Primary Cardiologist  ?Reason for Consultation EKG changes ?  ?HPI: The patient is a 61 year old male with a past medical history notable for asthma, COPD, and current tobacco use who presented to [facility] ED in the early morning hours complaining of shortness of breath with worsening cough and congestion.  Per EMS reporting the patient's O2 sats were in the 60s and he was given 2 DuoNebs and Solu-Medrol on route.  He presented to the ED in moderate respiratory distress with diffuse wheezing and had chest tightness without chest pain.  He was given DuoNebs x3 and 2 g of IV mag and was subsequently placed on BiPAP and eventually required intubation due to worsening respiratory status.  Cardiology was consulted because of ventricular bigeminy, and then now persistent new onset atrial fibrillation.  He remains intubated on the ventilator. ? ?Interval History: ?-Examined lying flat in bed.  No chest pain, palpitations, still feels weak and is eager to get stronger and get out of the hospital. ?-Review of telemetry shows patient in sinus rhythm with ventricular bigeminy, heart rate has been sustained between 71-110 with activity.  Pulse ox is likely not capturing the premature beats. ? ? ?Vitals:  ? 02/08/22 0444 02/08/22 0450 02/08/22 0837 02/08/22 0911  ?BP: 112/67   101/60  ?Pulse: (!) 44 (!) 48 81 (!) 47  ?Resp: 20 16 (!) 22 19  ?Temp: 98.1 ?F (36.7 ?C)   97.8 ?F (36.6 ?C)  ?TempSrc: Oral   Oral  ?SpO2: 90%   94%  ?Weight:      ?Height:      ? ? ? ?Intake/Output Summary (Last 24 hours) at 02/08/2022 1155 ?Last data filed at 02/08/2022 0137 ?Gross per 24 hour  ?Intake --  ?Output 1000 ml  ?Net -1000 ml  ? ? ? ?PHYSICAL EXAM ?General: Frail appearing middle-aged Caucasian male, lying flat in hospital bed ?HEENT:  Normocephalic and atraumatic. ?Neck:   No  JVD.  ?Lungs: normal work of breathing on room air, no crackles or wheezes anteriorly  ?Heart: Regular rate frequent premature beats Normal S1 and S2 without gallops or murmurs. Radial & DP pulses 2+ bilaterally. ?Abdomen: Obese appearing ?Msk: Normal tone for age. ?Extremities: Warm and well perfused. No clubbing, cyanosis.   ?Neuro: Alert and oriented x 3  ?Psych: mood appropriate for situation.  ? ? ?LABS: ?Basic Metabolic Panel: ?Recent Labs  ?  02/07/22 ?0962 02/08/22 ?8366  ?NA 137 137  ?K 3.5 3.5  ?CL 104 107  ?CO2 27 25  ?GLUCOSE 90 87  ?BUN 28* 24*  ?CREATININE 0.57* 0.60*  ?CALCIUM 7.9* 8.1*  ?MG 2.1 2.0  ?PHOS 2.3* 2.4*  ? ? ?Liver Function Tests: ?No results for input(s): AST, ALT, ALKPHOS, BILITOT, PROT, ALBUMIN in the last 72 hours. ? ?No results for input(s): LIPASE, AMYLASE in the last 72 hours. ?CBC: ?Recent Labs  ?  02/06/22 ?0400 02/07/22 ?2947  ?WBC 11.1* 10.5  ?NEUTROABS 9.0*  --   ?HGB 13.8 14.2  ?HCT 43.7 41.9  ?MCV 94.8 92.9  ?PLT 232 297  ? ? ?Cardiac Enzymes: ?No results for input(s): CKTOTAL, CKMB, CKMBINDEX, TROPONINI in the last 72 hours. ?BNP: ?Invalid input(s): POCBNP ?D-Dimer: ?No results for input(s): DDIMER in the last 72 hours. ?Hemoglobin A1C: ?No results for input(s): HGBA1C in the last 72 hours. ?Fasting Lipid Panel: ?No results for  input(s): CHOL, HDL, LDLCALC, TRIG, CHOLHDL, LDLDIRECT in the last 72 hours. ? ?Thyroid Function Tests: ?No results for input(s): TSH, T4TOTAL, T3FREE, THYROIDAB in the last 72 hours. ? ?Invalid input(s): FREET3 ? ?Anemia Panel: ?No results for input(s): VITAMINB12, FOLATE, FERRITIN, TIBC, IRON, RETICCTPCT in the last 72 hours. ? ?No results found. ? ?Echo LVEF 60-65% ? ?TELEMETRY: sinus rhythm with ventricular bigeminy, rate 71-110 ? ?ASSESSMENT AND PLAN: ? ?Principal Problem: ?  Asthma exacerbation ?Active Problems: ?  COPD exacerbation (HCC) ?  Tobacco abuse ?  Acute respiratory failure with hypoxia and hypercapnia (HCC) ?  Overweight (BMI  25.0-29.9) ?  A-fib (HCC) ?  Dysphagia ?  Oral thrush ?  Physical deconditioning ? ? ?1. Atrial fibrillation with RVR, compensatory for underlying respiratory failure, on Lovenox for stroke prevention. In NSR 3/9, but recurrent on 3/10. ?2.  Frequent PVCs of uncertain clinical significance, normal left ventricular function ?3.  Respiratory failure / asthma / COPD exacerbation / severe ARDS / pansinusitis extubated 02/05/22.  ? ?Recommendations ?  ?1.  Agree with current therapy per PCCM ?2.  Received roughly 9g of amiodarone IV since admission ?- continue amiodarone 200mg  BID  ?- continue diltiazem 90mg  q6h --consolidate to 240 mg CD at discharge ?- continue metoprolol 12.5 q6h --consolidate to 25 mg twice daily at discharge ?3.  Continue Lovenox for stroke prevention - transition to Eliquis 5 mg twice daily at discharge for stroke prevention. ?4.  Hold on lasix. None given since 02/03/22.  ?5.  Defer further cardiac diagnostics at this time ?6. Outpatient follow up with cardiology 1-2 weeks after discharge.  ? ?This patient's plan of care was discussed and created with Dr. and he is in agreement.   ? ?04/05/22, PA-C ?02/08/2022 ?11:55 AM ? ? ? ?  ?

## 2022-02-08 NOTE — Progress Notes (Signed)
Occupational Therapy Treatment ?Patient Details ?Name: Kenneth Benson ?MRN: [id number] ?DOB: 06/23/1961 ?Today's Date: 02/08/2022 ? ? ?History of present illness Pt admitted for asthma exacerbation with complaints of SOB and cough. Pt with complicated hospital stay with intubation 2/24-3/11. ?  ?OT comments ? Pt seen for OT tx this date. Pt alert, oriented, motivated, and pleasant throughout. Pt instructed in ways to grade the BUE exercises he was previously given to increased the challenge and pt able to return demo. Pt instructed in heel slides 2 sets x10 with brief rest break, and instructed in trunk rotation ex x10, and modified pull ups using bed rails 2 sets x10. Pt able to reposition to sidelying in bed at end of session without assist preparing for nap. Eager to work with PT later today. Pt demonstrating progress towards goals. Continue to recommend CIR.   ? ?Recommendations for follow up therapy are one component of a multi-disciplinary discharge planning process, led by the attending physician.  Recommendations may be updated based on patient status, additional functional criteria and insurance authorization. ?   ?Follow Up Recommendations ? Acute inpatient rehab (3hours/day)  ?  ?Assistance Recommended at Discharge Frequent or constant Supervision/Assistance  ?Patient can return home with the following ? Two people to help with walking and/or transfers;Assist for transportation;Help with stairs or ramp for entrance;Direct supervision/assist for medications management;Direct supervision/assist for financial management;Assistance with cooking/housework;A lot of help with bathing/dressing/bathroom ?  ?Equipment Recommendations ? BSC/3in1  ?  ?Recommendations for Other Services   ? ?  ?Precautions / Restrictions Precautions ?Precautions: Fall ?Restrictions ?Weight Bearing Restrictions: No  ? ? ?  ? ?Mobility Bed Mobility ?Overal bed mobility: Needs Assistance ?Bed Mobility: Rolling ?Rolling: Modified independent  (Device/Increase time) ?  ?  ?  ?  ?General bed mobility comments: pt able to roll to his side for comfort with mod indep ?  ? ?Transfers ?  ?  ?  ?  ?  ?  ?  ?  ?  ?  ?  ?  ?Balance   ?  ?  ?  ?  ?  ?  ?  ?  ?  ?  ?  ?  ?  ?  ?  ?  ?  ?  ?   ? ?ADL either performed or assessed with clinical judgement  ? ?ADL Overall ADL's : Needs assistance/impaired ?Eating/Feeding: Bed level;Set up ?Eating/Feeding Details (indicate cue type and reason): long sitting in bed with repositioning for improved access and safety, pt able to use plastic utensils to self feed, noting mild difficulty bringing utensil fulling to mouth, requiring him to perform cervical protraction ?Grooming: Bed level;Wash/dry face;Oral care;Brushing hair;Moderate assistance;Minimal assistance ?Grooming Details (indicate cue type and reason): Pt required MIN A to wash his face and MOD A for combing hair due to BUE weakness reaching his forehead and top/back of head against gravity ?  ?  ?  ?  ?  ?  ?  ?  ?  ?  ?  ?  ?  ?  ?  ?  ?  ? ?Extremity/Trunk Assessment   ?  ?  ?  ?  ?  ? ?Vision   ?  ?  ?Perception   ?  ?Praxis   ?  ? ?Cognition Arousal/Alertness: Awake/alert ?Behavior During Therapy: [facility] for tasks assessed/performed ?Overall Cognitive Status: Within Functional Limits for tasks assessed ?  ?  ?  ?  ?  ?  ?  ?  ?  ?  ?  ?  ?  ?  ?  ?  ?  General Comments: Pt alert, oriented, and pleasant and motivated throughout session, demonstrating excellent carryover/recall of previous instruction in exercises ?  ?  ?   ?Exercises Other Exercises ?Other Exercises: Pt instructed in ways to grade the BUE exercises he was previously given to increased the challenge and pt able to return demo; pt instructed in heel slides 2 sets x10 with brief rest break; pt instructed in trunk rotation ex x10, and modified pull ups using bed rails 2 sets x10. ? ?  ?Shoulder Instructions   ? ? ?  ?General Comments    ? ? ?Pertinent Vitals/ Pain       Pain Assessment ?Pain Assessment:  No/denies pain ? ?Home Living   ?  ?  ?  ?  ?  ?  ?  ?  ?  ?  ?  ?  ?  ?  ?  ?  ?  ?  ? ?  ?Prior Functioning/Environment    ?  ?  ?  ?   ? ?Frequency ? Min 3X/week  ? ? ? ? ?  ?Progress Toward Goals ? ?OT Goals(current goals can now be found in the care plan section) ? Progress towards OT goals: Progressing toward goals ? ?Acute Rehab OT Goals ?Patient Stated Goal: feel better ?OT Goal Formulation: With patient ?Time For Goal Achievement: 02/20/22 ?Potential to Achieve Goals: Good  ?Plan Discharge plan remains appropriate;Frequency remains appropriate   ? ?Co-evaluation ? ? ?   ?  ?  ?  ?  ? ?  ?AM-PAC OT "6 Clicks" Daily Activity     ?Outcome Measure ? ? Help from another person eating meals?: A Little ?Help from another person taking care of personal grooming?: A Lot ?Help from another person toileting, which includes using toliet, bedpan, or urinal?: A Lot ?Help from another person bathing (including washing, rinsing, drying)?: A Lot ?Help from another person to put on and taking off regular upper body clothing?: A Lot ?Help from another person to put on and taking off regular lower body clothing?: A Lot ?6 Click Score: 13 ? ?  ?End of Session   ? ?OT Visit Diagnosis: Muscle weakness (generalized) (M62.81) ?  ?Activity Tolerance Patient tolerated treatment well ?  ?Patient Left in bed;with call bell/phone within reach;with bed alarm set ?  ?Nurse Communication   ?  ? ?   ? ?Time: [id number] ?OT Time Calculation (min): 39 min ? ?Charges: OT General Charges ?$OT Visit: 1 Visit ?OT Treatments ?$Self Care/Home Management : 8-22 mins ?$Therapeutic Exercise: 23-37 mins ? ?Ardeth Perfect., MPH, MS, OTR/L ?ascom [phone] ?02/08/22, 1:00 PM ? ?

## 2022-02-09 DIAGNOSIS — R1312 Dysphagia, oropharyngeal phase: Secondary | ICD-10-CM

## 2022-02-09 LAB — BLOOD GAS, ARTERIAL
Acid-Base Excess: 17.9 mmol/L — ABNORMAL HIGH (ref 0.0–2.0)
Bicarbonate: 43.9 mmol/L — ABNORMAL HIGH (ref 20.0–28.0)
FIO2: 30 %
MECHVT: 470 mL
O2 Saturation: 92.9 %
PEEP: 5 cmH2O
Patient temperature: 37
RATE: 18 resp/min
pCO2 arterial: 55 mmHg — ABNORMAL HIGH (ref 32–48)
pH, Arterial: 7.51 — ABNORMAL HIGH (ref 7.35–7.45)
pO2, Arterial: 59 mmHg — ABNORMAL LOW (ref 83–108)

## 2022-02-09 LAB — PHOSPHORUS: Phosphorus: 2.5 mg/dL (ref 2.5–4.6)

## 2022-02-09 LAB — MAGNESIUM: Magnesium: 2 mg/dL (ref 1.7–2.4)

## 2022-02-09 NOTE — TOC Progression Note (Signed)
Transition of Care (TOC) - Progression Note  ? ? ?Patient Details  ?Name: Kenneth Benson ?MRN: [id number] ?Date of Birth: 02/10/1961 ? ?Transition of Care (TOC) CM/SW Contact  ?Mozella Rexrode A Shantoria Ellwood, LCSW ?Phone Number: ?02/09/2022, 9:20 AM ? ?Clinical Narrative:   CIR to assess today. ? ? ? ?Expected Discharge Plan: IP Rehab Facility (CIR vs SNF) ?Barriers to Discharge: Continued Medical Work up ? ?Expected Discharge Plan and Services ?Expected Discharge Plan: IP Rehab Facility (CIR vs SNF) ?  ?  ?Post Acute Care Choice: Skilled Nursing Facility, IP Rehab ?Living arrangements for the past 2 months: Single Family Home ?                ?  ?  ?  ?  ?  ?  ?  ?  ?  ?  ? ? ?Social Determinants of Health (SDOH) Interventions ?  ? ?Readmission Risk Interventions ?No flowsheet data found. ? ?

## 2022-02-09 NOTE — Progress Notes (Signed)
?Progress Note ? ? ? ?Kenneth Benson   ?[id number]  ?DOB: [birth year]  ?DOA: 01/21/2022     19 ?PCP: Pcp, No ? ?Initial CC: SOB ? ?Hospital Course: ?[name] is a 61 year old male with PMH COPD, asthma, and ongoing tobacco abuse who presented to the emergency room on 2/24 with shortness of breath and found to be in significant respiratory distress.  Initially placed on BiPAP but started to decline and required mechanical ventilation.  ?Hospital course complicated by development of atrial fibrillation.  Cardiology consulted and patient placed on Cardizem and eventually amiodarone.  Required diuresis.  Finally after multiple attempts to wean off of ventilator, able to be fully extubated by 3/11. Transferred to hospitalist service starting 3/12. ? ?Interval History:  ?No events overnight.  Sitting up in chair this morning.  Breathing has continued to improve.  He also continues to work with physical therapy and is hoping to continue to improve as time goes. He is willing and anxious to get better enough to go home again.  ? ?Assessment and Plan: ?* Asthma exacerbation ?-  Set off by rhinovirus/enterovirus.  Continue nebulizers.  Tapering steroids ? ?Acute respiratory failure with hypoxia and hypercapnia (HCC)-resolved as of 02/09/2022 ?- Secondary COPD and asthma exacerbations.  Able to be weaned off of ventilator.   ?- Solu-Medrol changed over to p.o. prednisone and tapering.  - Continue nebulizers.  ?- Able to be weaned to room air ? ?COPD exacerbation (HCC) ?- Considered precipitated by rhinovirus infection initially.  Later developed haemophilus as well ?-Continue steroids and breathing treatments ? ?A-fib (HCC) ?- New onset.  Cardiology following, appreciate assistance ?- Previously on Cardizem and amiodarone ?- Now maintained on Lopressor only with mild borderline bradycardia, continue monitoring for further medication adjustment as needed ?- Continue Lovenox for anticoagulation.  When approaching discharge,  will transition to Eliquis ? ?Dysphagia ?No evidence of any neurological deficit.  Patient has been on a ventilator for some time and therefore, still weak.  Seen by speech therapy and will remain n.p.o. at risk for silent aspiration.  Should improve over the next few days. ? ?Oral thrush-resolved as of 02/09/2022 ?- Treated with Diflucan ? ?Physical deconditioning ?- Secondary to prolonged illness and being on the ventilator.   ?- followed by PT/OT ?- tentative plan is for CIR if able to be approved ? ?Overweight (BMI 25.0-29.9) ?Meets criteria with BMI greater than 25 ? ?Tobacco abuse ?Counseled.  Declines nicotine patch ? ? ?Old records reviewed in assessment of this patient ? ?Antimicrobials: ? ? ?DVT prophylaxis:  ?SCDs Start: 01/21/22 0901 ? ? ?Code Status:   Code Status: Full Code ? ?Disposition Plan:  Possibly CIR ?Status is: Inpt ? ?Objective: ?Blood pressure 103/73, pulse 66, temperature 98 ?F (36.7 ?C), resp. rate 17, height 6' (1.829 m), weight 96.6 kg, SpO2 93 %.  ?Examination:  ?Physical Exam ?Constitutional:   ?   Appearance: He is well-developed.  ?HENT:  ?   Head: Normocephalic and atraumatic.  ?Eyes:  ?   Extraocular Movements: Extraocular movements intact.  ?Cardiovascular:  ?   Rate and Rhythm: Normal rate and regular rhythm.  ?Pulmonary:  ?   Effort: Pulmonary effort is normal.  ?   Breath sounds: Normal breath sounds.  ?Abdominal:  ?   General: Bowel sounds are normal. There is no distension.  ?   Palpations: Abdomen is soft.  ?   Tenderness: There is no abdominal tenderness.  ?Musculoskeletal:     ?   General: Normal  range of motion.  ?   Cervical back: Normal range of motion and neck supple.  ?Skin: ?   General: Skin is warm and dry.  ?Neurological:  ?   General: No focal deficit present.  ?   Mental Status: He is alert and oriented to person, place, and time.  ?Psychiatric:     ?   Mood and Affect: Mood normal.     ?   Behavior: Behavior normal.  ?  ? ?Consultants:  ?Cardiology ? ?Procedures:   ? ? ?Data Reviewed: ?Results for orders placed or performed during the hospital encounter of 01/21/22 (from the past 24 hour(s))  ?Magnesium     Status: None  ? Collection Time: 02/09/22  4:44 AM  ?Result Value Ref Range  ? Magnesium 2.0 1.7 - 2.4 mg/dL  ?Phosphorus     Status: None  ? Collection Time: 02/09/22  4:44 AM  ?Result Value Ref Range  ? Phosphorus 2.5 2.5 - 4.6 mg/dL  ?  ?I have Reviewed nursing notes, Vitals, and Lab results since pt's last encounter. Pertinent lab results : see above ?I have ordered test including BMP, CBC, Mg ?I have reviewed the last note from staff over past 24 hours ?I have discussed pt's care plan and test results with nursing staff, case manager ? ? LOS: 19 days  ? ?Lewie Chamber, MD ?[facility] ?02/09/2022, 2:56 PM ? ?

## 2022-02-09 NOTE — Progress Notes (Signed)
Physical Therapy Treatment ?Patient Details ?Name: Kenneth Benson ?MRN: [id number] ?DOB: 11/07/1961 ?Today's Date: 02/09/2022 ? ? ?History of Present Illness Pt admitted for asthma exacerbation with complaints of SOB and cough. Pt with complicated hospital stay with intubation 2/24-3/11. ? ?  ?PT Comments  ? ? Pt is making excellent progress towards goals, performed combined PT/OT session this date as CIR admission coordinator coming in PM to do assessment. Pt asleep upon arrival, however very agreeable and motivated to participate once awakened. Pt with urgent need for BM. Needs +2 assist and demonstrates ability to transfer on/of BSC and between surfaces. Bed->BSC>recliner. Once in recliner, performed seated there-ex and continued education given on HEP. Pt is happy to be out of bed, however does demonstrate SOB symptoms rating 8/10 on RPE. O2 sats ranging from 91-93% with exertion on RA. Pt left in recliner with feet on ground in slight gastroc stretch to improve ROM as he is still exhibiting slight foot drop. Will continue to progress as able.  ?Recommendations for follow up therapy are one component of a multi-disciplinary discharge planning process, led by the attending physician.  Recommendations may be updated based on patient status, additional functional criteria and insurance authorization. ? ?Follow Up Recommendations ? Acute inpatient rehab (3hours/day) ?  ?  ?Assistance Recommended at Discharge Frequent or constant Supervision/Assistance  ?Patient can return home with the following Two people to help with walking and/or transfers;Two people to help with bathing/dressing/bathroom ?  ?Equipment Recommendations ?  (TBD)  ?  ?Recommendations for Other Services   ? ? ?  ?Precautions / Restrictions Precautions ?Precautions: Fall ?Restrictions ?Weight Bearing Restrictions: No  ?  ? ?Mobility ? Bed Mobility ?Overal bed mobility: Needs Assistance ?Bed Mobility: Supine to Sit ?  ?  ?Supine to sit: Min assist ?  ?   ?General bed mobility comments: improved technique this date. Cues given for sliding B LE off bed and trunkal elevation. Once seated at EOB, uses B UE to prop and maintain balance. ?  ? ?Transfers ?Overall transfer level: Needs assistance ?Equipment used: Rolling walker (2 wheels) ?Transfers: Sit to/from Stand, Bed to chair/wheelchair/BSC ?Sit to Stand: Mod assist, +2 physical assistance ?Stand pivot transfers: Min assist, +2 physical assistance ?  ?  ?  ?  ?General transfer comment: able to stand from multiple height surfaces with min<>mod assist depending on surface height. Once standing, heavy B UE WBing noted through RW. Cues for safe hand placement ?  ? ?Ambulation/Gait ?  ?  ?  ?  ?  ?  ?  ?General Gait Details: unsafe/unable at this time ? ? ?Stairs ?  ?  ?  ?  ?  ? ? ?Wheelchair Mobility ?  ? ?Modified Rankin (Stroke Patients Only) ?  ? ? ?  ?Balance Overall balance assessment: Needs assistance ?Sitting-balance support: Bilateral upper extremity supported ?Sitting balance-Leahy Scale: Fair ?  ?  ?Standing balance support: Bilateral upper extremity supported, During functional activity, Reliant on assistive device for balance ?Standing balance-Leahy Scale: Fair ?  ?  ?  ?  ?  ?  ?  ?  ?  ?  ?  ?  ?  ? ?  ?Cognition Arousal/Alertness: Awake/alert ?Behavior During Therapy: [facility] for tasks assessed/performed ?Overall Cognitive Status: Within Functional Limits for tasks assessed ?  ?  ?  ?  ?  ?  ?  ?  ?  ?  ?  ?  ?  ?  ?  ?  ?General Comments: motivated to  participate ?  ?  ? ?  ?Exercises Other Exercises ?Other Exercises: seated ther-ex performed on B LE including AP, alt marching (low foot clearance), modified abdominal crunches. All ther-ex performed with cga/supervision and 10 reps. ?Other Exercises: performed SPT to [facility] with min assist +2. Heavy mod assist to stand for endurance while additional person needed to perform max assist for hygiene. Pt then transferred from BSC->recliner ? ?  ?General Comments  General comments (skin integrity, edema, etc.): vss throughout ?  ?  ? ?Pertinent Vitals/Pain Pain Assessment ?Pain Assessment: No/denies pain  ? ? ?Home Living   ?  ?  ?  ?  ?  ?  ?  ?  ?  ?   ?  ?Prior Function    ?  ?  ?   ? ?PT Goals (current goals can now be found in the care plan section) Acute Rehab PT Goals ?Patient Stated Goal: to get stronger and feed himself ?PT Goal Formulation: With patient ?Time For Goal Achievement: 02/19/22 ?Potential to Achieve Goals: Fair ?Progress towards PT goals: Progressing toward goals ? ?  ?Frequency ? ? ? Min 2X/week ? ? ? ?  ?PT Plan Current plan remains appropriate  ? ? ?Co-evaluation PT/OT/SLP Co-Evaluation/Treatment: Yes ?Reason for Co-Treatment: For patient/therapist safety;To address functional/ADL transfers ?PT goals addressed during session: Strengthening/ROM;Mobility/safety with mobility ?OT goals addressed during session: ADL's and self-care;Strengthening/ROM ?  ? ?  ?AM-PAC PT "6 Clicks" Mobility   ?Outcome Measure ? Help needed turning from your back to your side while in a flat bed without using bedrails?: A Little ?Help needed moving from lying on your back to sitting on the side of a flat bed without using bedrails?: A Little ?Help needed moving to and from a bed to a chair (including a wheelchair)?: A Lot ?Help needed standing up from a chair using your arms (e.g., wheelchair or bedside chair)?: A Lot ?Help needed to walk in hospital room?: Total ?Help needed climbing 3-5 steps with a railing? : Total ?6 Click Score: 12 ? ?  ?End of Session Equipment Utilized During Treatment: Gait belt ?Activity Tolerance: Patient tolerated treatment well ?Patient left: in chair;with chair alarm set ?Nurse Communication: Mobility status ?PT Visit Diagnosis: Muscle weakness (generalized) (M62.81);Difficulty in walking, not elsewhere classified (R26.2) ?  ? ? ?Time: 7564-3329 ?PT Time Calculation (min) (ACUTE ONLY): 37 min ? ?Charges:  $Therapeutic Exercise: 8-22 mins           ?          ? ?Elizabeth Palau, PT, DPT, GCS ?[phone] ? ? ? ?Christiana Gurevich ?02/09/2022, 1:22 PM ? ?

## 2022-02-09 NOTE — Progress Notes (Signed)
Occupational Therapy Treatment ?Patient Details ?Name: Kenneth Benson ?MRN: [id number] ?DOB: [birth year] ?Today's Date: 02/09/2022 ? ? ?History of present illness Pt admitted for asthma exacerbation with complaints of SOB and cough. Pt with complicated hospital stay with intubation 2/24-3/11. ?  ?OT comments ? Chart reviewed to date, pt greeted semi supine in bed agreeable to tx session. Co tx completed on this date for pt/therapist safety. Pt is making progress towards all goals set fourth as evidenced by requiring decreased assistance for bed mobility STS, SPT to [facility]. Pt also demonstrates improvements in ADL performance. Pt with improvements in BUE function. Despite progress pt continues to perform ADL/functional mobility below PLOF and will continue to benefit from AIR to address functional deficits. Pt is left in bedside chair, NAD, all needs met. Pt provided built up handle and education on use for trial during meals times today. OT will continue to follow while admitted.   ? ?Recommendations for follow up therapy are one component of a multi-disciplinary discharge planning process, led by the attending physician.  Recommendations may be updated based on patient status, additional functional criteria and insurance authorization. ?   ?Follow Up Recommendations ? Acute inpatient rehab (3hours/day)  ?  ?Assistance Recommended at Discharge Frequent or constant Supervision/Assistance  ?Patient can return home with the following ? Two people to help with walking and/or transfers;Assist for transportation;Help with stairs or ramp for entrance;Direct supervision/assist for medications management;Direct supervision/assist for financial management;Assistance with cooking/housework;A lot of help with bathing/dressing/bathroom ?  ?Equipment Recommendations ? BSC/3in1  ?  ?Recommendations for Other Services   ? ?  ?Precautions / Restrictions Precautions ?Precautions: Fall ?Restrictions ?Weight Bearing Restrictions: No  ? ? ?   ? ?Mobility Bed Mobility ?Overal bed mobility: Needs Assistance ?Bed Mobility: Supine to Sit ?Rolling:  (HOB down) ?  ?Supine to sit: Min assist (HOB lowered) ?  ?  ?  ?  ? ?Transfers ?Overall transfer level: Needs assistance ?Equipment used: Rolling walker (2 wheels) ?Transfers: Sit to/from Stand, Bed to chair/wheelchair/BSC ?Sit to Stand: Min assist, Mod assist, +2 physical assistance ?Stand pivot transfers: Min assist, +2 physical assistance ?  ?  ?  ?  ?  ?  ?  ?Balance Overall balance assessment: Needs assistance ?Sitting-balance support: Bilateral upper extremity supported ?Sitting balance-Leahy Scale: Fair ?  ?  ?Standing balance support: Bilateral upper extremity supported, During functional activity, Reliant on assistive device for balance ?Standing balance-Leahy Scale: Fair ?  ?  ?  ?  ?  ?  ?  ?  ?  ?  ?  ?  ?   ? ?ADL either performed or assessed with clinical judgement  ? ?ADL Overall ADL's : Needs assistance/impaired ?Eating/Feeding: Sitting;Minimal assistance ?  ?Grooming: Wash/dry hands;Wash/dry face;Sitting;Minimal assistance ?Grooming Details (indicate cue type and reason): seated in chair ?Upper Body Bathing: Minimal assistance;Sitting ?Upper Body Bathing Details (indicate cue type and reason): washing under arm pits ?Lower Body Bathing: Maximal assistance ?  ?Upper Body Dressing : Moderate assistance ?Upper Body Dressing Details (indicate cue type and reason): gown, seated in chair ?Lower Body Dressing: Maximal assistance;Bed level ?Lower Body Dressing Details (indicate cue type and reason): socks ?Toilet Transfer: Minimal assistance;+2 for physical assistance;BSC/3in1;Stand-pivot;Rolling walker (2 wheels) ?  ?Toileting- Clothing Manipulation and Hygiene: Maximal assistance;Sit to/from stand ?  ?  ?  ?Functional mobility during ADLs: Minimal assistance;Moderate assistance;+2 for physical assistance;Rolling walker (2 wheels) ?  ?  ? ?Extremity/Trunk Assessment Upper Extremity Assessment ?Upper  Extremity Assessment: RUE deficits/detail;LUE deficits/detail ?RUE  Deficits / Details: BUE shoulders to approx 90 degrees AROM on thsi date, 3/4 AROM in elbow, wrists.Pt is able to bring hand to mouth to simulate feeding tasks. ?  ?  ?  ?  ?  ? ?Vision Patient Visual Report: No change from baseline ?  ?  ?Perception   ?  ?Praxis   ?  ? ?Cognition Arousal/Alertness: Awake/alert ?Behavior During Therapy: [facility] for tasks assessed/performed ?Overall Cognitive Status: Within Functional Limits for tasks assessed ?  ?  ?  ?  ?  ?  ?  ?  ?  ?  ?  ?  ?  ?  ?  ?  ?General Comments: significantly improved from evaluation ?  ?  ?   ?Exercises Other Exercises ?Other Exercises: education re: use of built up handle, simulated feeding tasks ? ?  ?Shoulder Instructions   ? ? ?  ?General Comments vss throughout  ? ? ?Pertinent Vitals/ Pain       Pain Assessment ?Pain Assessment: No/denies pain ? ?Home Living   ?  ?  ?  ?  ?  ?  ?  ?  ?  ?  ?  ?  ?  ?  ?  ?  ?  ?  ? ?  ?Prior Functioning/Environment    ?  ?  ?  ?   ? ?Frequency ? Min 3X/week  ? ? ? ? ?  ?Progress Toward Goals ? ?OT Goals(current goals can now be found in the care plan section) ? Progress towards OT goals: Progressing toward goals ? ?Acute Rehab OT Goals ?Patient Stated Goal: get stronger ?OT Goal Formulation: With patient ?Time For Goal Achievement: 02/23/22 ?Potential to Achieve Goals: Good  ?Plan Discharge plan remains appropriate;Frequency remains appropriate   ? ?Co-evaluation ? ? ?   ?  ?  ?  ?  ? ?  ?AM-PAC OT "6 Clicks" Daily Activity     ?Outcome Measure ? ? Help from another person eating meals?: A Little ?Help from another person taking care of personal grooming?: A Little ?Help from another person toileting, which includes using toliet, bedpan, or urinal?: A Lot ?Help from another person bathing (including washing, rinsing, drying)?: A Lot ?Help from another person to put on and taking off regular upper body clothing?: A Little ?Help from another person to  put on and taking off regular lower body clothing?: A Lot ?6 Click Score: 15 ? ?  ?End of Session Equipment Utilized During Treatment: Gait belt;Rolling walker (2 wheels) ? ?OT Visit Diagnosis: Muscle weakness (generalized) (M62.81) ?  ?Activity Tolerance Patient tolerated treatment well ?  ?Patient Left in chair;with call bell/phone within reach;with chair alarm set ?  ?Nurse Communication   ?  ? ?   ? ?Time: 1855-0158 ?OT Time Calculation (min): 32 min ? ?Charges: OT General Charges ?$OT Visit: 1 Visit ?OT Treatments ?$Self Care/Home Management : 8-22 mins ? ?Shanon Payor, OTD OTR/L  ?02/09/22, 11:08 AM  ?

## 2022-02-09 NOTE — Progress Notes (Signed)
Nutrition Follow-up ? ?DOCUMENTATION CODES:  ? ?Obesity unspecified ? ?INTERVENTION:  ? ?-Continue Ensure Enlive po BID, each supplement provides 350 kcal and 20 grams of protein.  ?-Continue MVI with minerals daily ? ?NUTRITION DIAGNOSIS:  ? ?Inadequate oral intake related to inability to eat (pt sedated and ventilated) as evidenced by NPO status. ? ?Progressing; advanced to PO diet on 02/07/22 ? ?GOAL:  ? ?Patient will meet greater than or equal to 90% of their needs ? ?Progressing  ? ?MONITOR:  ? ?PO intake, Supplement acceptance, Diet advancement, Labs, Weight trends, Skin, I & O's ? ?REASON FOR ASSESSMENT:  ? ?Ventilator ?  ? ?ASSESSMENT:  ? ?61 y/o male with h/o asthma, COPD and currently smoker who is admitted with severe COPD exacerbation. ? ?3/11- extubated ?3/12- s/p BSE- recommend continued NPO ?3/13- s/p MBSS- advanced to dysphagia 3 diet with thin liquids, rectal tube d/c ? ?Reviewed I/O's: -770 ml x 24 hours and -9.3 L since 01/26/22 ? ?UOP: 820 ml x 24 hours ? ?Pt working with therapy at time of visit.  ? ?Noted pt tolerating current diet texture well. No meal completion data available to assess at time of visit. Per RN, pt is eating well. Pt is also consuming Ensure supplements- noted empty container on bedside table.  ?  ?Per TOC notes, plan for CIR to assess pt today for possible admission.  ? ?Medications reviewed and include prednisone.  ? ?Labs reviewed: CBGS: 86-115.  ? ?Diet Order:   ?Diet Order   ? ?       ?  DIET DYS 3 Room service appropriate? Yes; Fluid consistency: Thin  Diet effective now       ?  ? ?  ?  ? ?  ? ? ?EDUCATION NEEDS:  ? ?No education needs have been identified at this time ? ?Skin:  Skin Assessment: Reviewed RN Assessment ? ?Last BM:  02/07/22 ? ?Height:  ? ?Ht Readings from Last 1 Encounters:  ?02/03/22 6' (1.829 m)  ? ? ?Weight:  ? ?Wt Readings from Last 1 Encounters:  ?02/09/22 96.6 kg  ? ? ?Ideal Body Weight:  80.9 kg ? ?BMI:  Body mass index is 28.89 kg/m?. ? ?Estimated  Nutritional Needs:  ? ?Kcal:  2250-2450 ? ?Protein:  120-135 grams ? ?Fluid:  > 2 L ? ? ? ?Levada Schilling, RD, LDN, CDCES ?Registered Dietitian II ?Certified Diabetes Care and Education Specialist ?Please refer to [facility] for RD and/or RD on-call/weekend/after hours pager  ?

## 2022-02-09 NOTE — Progress Notes (Addendum)
Inpatient Rehabilitation Admissions Coordinator  ? ?I met with patient at bedside for rehab assessment. He requests that [name], his girlfriend be his NOK contact, not his sister. He has not seen Almyra Free in over two years and does not want me to contact her. 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 They lived there for one month with his new job. Prior to that, they were homeless for over a year. He is uninsured and requesting assistance to apply for disability and medicaid. I reviewed an Estimated cost of care for CIR admit if it is determined that he is a candidate. His current address is [address], [place]. Not [place] as listed currently. I am unable to reach [name] by cell at [phone] to leave a voicemail so I have texted her and await reply.Discharge disposition determination pending. I have alerted acute team and TOC, Elena. ? ?Danne Baxter, RN, MSN ?Rehab Admissions Coordinator ?([phone] ?02/09/2022 3:03 PM ? ? ?I spoke with [name] by phone and discussed need to clarify home support and home address before I could pursue a possible CIR admit. I will follow up with her tomorrow. ?

## 2022-02-09 NOTE — Progress Notes (Signed)
Palliative: ?Chart review completed.  It seems that [name], Kenneth Benson, is working well with ST/PT/OT.  He continues to be enthusiastic in participating.  He is being considered for CIR. ? ?Conference with attending, bedside nursing staff, CIR, transition of care team related to patient condition, needs, goals of care, disposition. ? ?Plan: At this point continue full scope/full code, continuing to treat the treatable.  Encouraged Duane to consider healthcare power of attorney and CODE STATUS.  Awaiting evaluation from CIR. ? ?No charge ?[name], NP ?Palliative medicine team ?Team phone [phone] ?Greater than 50% of this time was spent counseling and coordinating care related to the above assessment and plan. ?

## 2022-02-09 NOTE — Progress Notes (Signed)
Inpatient Rehabilitation Admissions Coordinator  ? ?I have notified acute team that I will do bedside assessment of patient today between 1430 and 1500 today. ? ?Ottie Glazier, RN, MSN ?Rehab Admissions Coordinator ?[phone] ?02/09/2022 9:36 AM ? ?

## 2022-02-10 LAB — BASIC METABOLIC PANEL
Anion gap: 6 (ref 5–15)
BUN: 21 mg/dL — ABNORMAL HIGH (ref 6–20)
CO2: 29 mmol/L (ref 22–32)
Calcium: 8.2 mg/dL — ABNORMAL LOW (ref 8.9–10.3)
Chloride: 103 mmol/L (ref 98–111)
Creatinine, Ser: 0.75 mg/dL (ref 0.61–1.24)
GFR, Estimated: >60 mL/min (ref >60)
Glucose, Bld: 86 mg/dL (ref 70–99)
Potassium: 3.4 mmol/L — ABNORMAL LOW (ref 3.5–5.1)
Sodium: 138 mmol/L (ref 135–145)

## 2022-02-10 LAB — CBC WITH DIFFERENTIAL/PLATELET
Abs Immature Granulocytes: 0.06 10*3/uL (ref 0.00–0.07)
Basophils Absolute: 0 10*3/uL (ref 0.0–0.1)
Basophils Relative: 0 %
Eosinophils Absolute: 0 10*3/uL (ref 0.0–0.5)
Eosinophils Relative: 1 %
HCT: 40.4 % (ref 39.0–52.0)
Hemoglobin: 13.3 g/dL (ref 13.0–17.0)
Immature Granulocytes: 1 %
Lymphocytes Relative: 33 %
Lymphs Abs: 2.2 10*3/uL (ref 0.7–4.0)
MCH: 30.3 pg (ref 26.0–34.0)
MCHC: 32.9 g/dL (ref 30.0–36.0)
MCV: 92 fL (ref 80.0–100.0)
Monocytes Absolute: 0.4 10*3/uL (ref 0.1–1.0)
Monocytes Relative: 6 %
Neutro Abs: 4 10*3/uL (ref 1.7–7.7)
Neutrophils Relative %: 59 %
Platelets: 343 10*3/uL (ref 150–400)
RBC: 4.39 MIL/uL (ref 4.22–5.81)
RDW: 13.7 % (ref 11.5–15.5)
WBC: 6.7 10*3/uL (ref 4.0–10.5)
nRBC: 0 % (ref 0.0–0.2)

## 2022-02-10 LAB — MAGNESIUM: Magnesium: 1.9 mg/dL (ref 1.7–2.4)

## 2022-02-10 LAB — PHOSPHORUS: Phosphorus: 2 mg/dL — ABNORMAL LOW (ref 2.5–4.6)

## 2022-02-10 MED ORDER — POTASSIUM PHOSPHATES 15 MMOLE/5ML IV SOLN
30.0000 mmol | Freq: Once | INTRAVENOUS | Status: AC
  Administered 2022-02-10: 30 mmol via INTRAVENOUS
  Filled 2022-02-10: qty 10

## 2022-02-10 MED ORDER — MAGNESIUM SULFATE 2 GM/50ML IV SOLN
2.0000 g | Freq: Once | INTRAVENOUS | Status: AC
  Administered 2022-02-10: 2 g via INTRAVENOUS
  Filled 2022-02-10: qty 50

## 2022-02-10 MED ORDER — METOPROLOL TARTRATE 25 MG PO TABS
25.0000 mg | ORAL_TABLET | Freq: Two times a day (BID) | ORAL | Status: DC
Start: 2022-02-10 — End: 2022-02-13
  Administered 2022-02-10 – 2022-02-13 (×6): 25 mg via ORAL
  Filled 2022-02-10 (×6): qty 1

## 2022-02-10 NOTE — Progress Notes (Signed)
Inpatient Rehabilitation Admissions Coordinator  ? ?Noted patient progress with therapies at min guard assist level with 3 bouts of 10 ft or 30 feet total (limited by Isolation status) and min assist with adls. Sat in chair for a few hours today. I spoke with patient to encourage his mobility at [facility].He is progressing well and may not require a CIR level rehab if continues to progress over the next couple of days. I have attempted to contact his landlord to verify ability to return to previous address. ? ?Ottie Glazier, RN, MSN ?Rehab Admissions Coordinator ?[phone] ?02/10/2022 3:53 PM ? ?

## 2022-02-10 NOTE — Progress Notes (Signed)
?Progress Note ? ? ? ?Maryella Shivers   ?HA:[id number]  ?DOB: 1961-06-23  ?DOA: 01/21/2022     20 ?PCP: Pcp, No ? ?Initial CC: SOB ? ?Hospital Course: ?Mr. Creager is a 61 year old male with PMH COPD, asthma, and ongoing tobacco abuse who presented to the emergency room on 2/24 with shortness of breath and found to be in significant respiratory distress.  Initially placed on BiPAP but started to decline and required mechanical ventilation.  ?Hospital course complicated by development of atrial fibrillation.  Cardiology consulted and patient placed on Cardizem and eventually amiodarone.  Required diuresis.  Finally after multiple attempts to wean off of ventilator, able to be fully extubated by 3/11. Transferred to hospitalist service starting 3/12. ? ?Interval History:  ?No events overnight.  Resting in bed comfortably this morning.  Continues to work well with therapy. ? ?Assessment and Plan: ?* Asthma exacerbation ?-  Set off by rhinovirus/enterovirus.  Continue nebulizers.  Tapering steroids ? ?Acute respiratory failure with hypoxia and hypercapnia (HCC)-resolved as of 02/09/2022 ?- Secondary COPD and asthma exacerbations.  Able to be weaned off of ventilator.   ?- Solu-Medrol changed over to p.o. prednisone and tapering.  - Continue nebulizers.  ?- Able to be weaned to room air ? ?COPD exacerbation ([place]) ?- Considered precipitated by rhinovirus infection initially.  Later developed haemophilus as well ?-Continue steroids and breathing treatments ? ?A-fib ([place]) ?- New onset.  Cardiology following, appreciate assistance ?- Previously on Cardizem and amiodarone ?- Now maintained on Lopressor only with mild borderline bradycardia, continue monitoring for further medication adjustment as needed ?-Lopressor adjusted on 02/10/2022 due to worsening bradycardia.  Continue amiodarone.  Discussed with cardiology ? ?Dysphagia ?- Slowly improving after extubation ? ?Oral thrush-resolved as of 02/09/2022 ?- Treated with  Diflucan ? ?Physical deconditioning ?- Secondary to prolonged illness and being on the ventilator.   ?- followed by PT/OT ?- tentative plan is for CIR if able to be approved ? ?Overweight (BMI 25.0-29.9) ?Meets criteria with BMI greater than 25 ? ?Tobacco abuse ?Counseled.  Declines nicotine patch ? ? ?Old records reviewed in assessment of this patient ? ?Antimicrobials: ? ? ?DVT prophylaxis:  ?SCDs Start: 01/21/22 0901 ? ? ?Code Status:   Code Status: Full Code ? ?Disposition Plan:  Possibly CIR versus home with home health ?Status is: Inpt ? ?Objective: ?Blood pressure 107/68, pulse (!) 59, temperature 98 ?F (36.7 ?C), temperature source Oral, resp. rate 18, height 6' (1.829 m), weight 96.6 kg, SpO2 94 %.  ?Examination:  ?Physical Exam ?Constitutional:   ?   Appearance: He is well-developed.  ?HENT:  ?   Head: Normocephalic and atraumatic.  ?Eyes:  ?   Extraocular Movements: Extraocular movements intact.  ?Cardiovascular:  ?   Rate and Rhythm: Normal rate and regular rhythm.  ?Pulmonary:  ?   Effort: Pulmonary effort is normal.  ?   Breath sounds: Normal breath sounds.  ?Abdominal:  ?   General: Bowel sounds are normal. There is no distension.  ?   Palpations: Abdomen is soft.  ?   Tenderness: There is no abdominal tenderness.  ?Musculoskeletal:     ?   General: Normal range of motion.  ?   Cervical back: Normal range of motion and neck supple.  ?Skin: ?   General: Skin is warm and dry.  ?Neurological:  ?   General: No focal deficit present.  ?   Mental Status: He is alert and oriented to person, place, and time.  ?Psychiatric:     ?  Mood and Affect: Mood normal.     ?   Behavior: Behavior normal.  ?  ? ?Consultants:  ?Cardiology ? ?Procedures:  ? ? ?Data Reviewed: ?Results for orders placed or performed during the hospital encounter of 01/21/22 (from the past 24 hour(s))  ?Magnesium     Status: None  ? Collection Time: 02/10/22  4:28 AM  ?Result Value Ref Range  ? Magnesium 1.9 1.7 - 2.4 mg/dL  ?Phosphorus      Status: Abnormal  ? Collection Time: 02/10/22  4:28 AM  ?Result Value Ref Range  ? Phosphorus 2.0 (L) 2.5 - 4.6 mg/dL  ?Basic metabolic panel     Status: Abnormal  ? Collection Time: 02/10/22  4:28 AM  ?Result Value Ref Range  ? Sodium 138 135 - 145 mmol/L  ? Potassium 3.4 (L) 3.5 - 5.1 mmol/L  ? Chloride 103 98 - 111 mmol/L  ? CO2 29 22 - 32 mmol/L  ? Glucose, Bld 86 70 - 99 mg/dL  ? BUN 21 (H) 6 - 20 mg/dL  ? Creatinine, Ser 0.75 0.61 - 1.24 mg/dL  ? Calcium 8.2 (L) 8.9 - 10.3 mg/dL  ? GFR, Estimated >60 >60 mL/min  ? Anion gap 6 5 - 15  ?CBC with Differential/Platelet     Status: None  ? Collection Time: 02/10/22  4:28 AM  ?Result Value Ref Range  ? WBC 6.7 4.0 - 10.5 K/uL  ? RBC 4.39 4.22 - 5.81 MIL/uL  ? Hemoglobin 13.3 13.0 - 17.0 g/dL  ? HCT 40.4 39.0 - 52.0 %  ? MCV 92.0 80.0 - 100.0 fL  ? MCH 30.3 26.0 - 34.0 pg  ? MCHC 32.9 30.0 - 36.0 g/dL  ? RDW 13.7 11.5 - 15.5 %  ? Platelets 343 150 - 400 K/uL  ? nRBC 0.0 0.0 - 0.2 %  ? Neutrophils Relative % 59 %  ? Neutro Abs 4.0 1.7 - 7.7 K/uL  ? Lymphocytes Relative 33 %  ? Lymphs Abs 2.2 0.7 - 4.0 K/uL  ? Monocytes Relative 6 %  ? Monocytes Absolute 0.4 0.1 - 1.0 K/uL  ? Eosinophils Relative 1 %  ? Eosinophils Absolute 0.0 0.0 - 0.5 K/uL  ? Basophils Relative 0 %  ? Basophils Absolute 0.0 0.0 - 0.1 K/uL  ? Immature Granulocytes 1 %  ? Abs Immature Granulocytes 0.06 0.00 - 0.07 K/uL  ?  ?I have Reviewed nursing notes, Vitals, and Lab results since pt's last encounter. Pertinent lab results : see above ?I have ordered test including BMP, CBC, Mg ?I have reviewed the last note from staff over past 24 hours ?I have discussed pt's care plan and test results with nursing staff, case manager ? ? LOS: 20 days  ? ?Dwyane Dee, MD ?[facility] ?02/10/2022, 5:54 PM ? ?

## 2022-02-10 NOTE — Progress Notes (Signed)
Physical Therapy Treatment ?Patient Details ?Name: Kenneth Benson ?MRN: [id number] ?DOB: 10-13-1961 ?Today's Date: 02/10/2022 ? ? ?History of Present Illness Pt is a 61 y.o. male who presented to the ED on 2/24 w/ SOB and found to be in significant respiratory distress. Intubated on 2/24 due to decline, extuabted on 3/11. Developed A-Fib during hospital stay. PmHx: COPD, Asthma, Ongoing Tobacco Abuse. ? ?  ?PT Comments  ? ? Pt awake and alert resting in bed upon PT entrance into room for today's session. Very agreeable to PT today and denies any c/o pain at rest. He is able to perform all bed mobility w/ SUPERVISION to sit EOB to don socks prior to further mobility. Sit to stand was performed w/ CGA using RW from elevated bed height to take side steps to transfer from recliner. Once seated in recliner Pt was able to complete 3 bouts of ambulation (~64ft) w/ CGA using RW; Pt requested rest breaks due to increase in fatigue, SpO2 stayed in 90-93s throughout session. Pt will benefit from continued skilled PT in order to increase LE strength, improve mobility/gait/endurance, and restore PLOF. Current discharge recommendation remains appropriate due to the level of assistance required by the patient to ensure safety and improve overall function. ?  ?Recommendations for follow up therapy are one component of a multi-disciplinary discharge planning process, led by the attending physician.  Recommendations may be updated based on patient status, additional functional criteria and insurance authorization. ? ?Follow Up Recommendations ? Acute inpatient rehab (3hours/day) ?  ?  ?Assistance Recommended at Discharge Frequent or constant Supervision/Assistance  ?Patient can return home with the following A lot of help with walking and/or transfers;A lot of help with bathing/dressing/bathroom;Assistance with cooking/housework;Help with stairs or ramp for entrance;Assist for transportation ?  ?Equipment Recommendations ?  (TBD)  ?   ?Recommendations for Other Services   ? ? ?  ?Precautions / Restrictions Precautions ?Precautions: Fall ?Restrictions ?Weight Bearing Restrictions: No  ?  ? ?Mobility ? Bed Mobility ?Overal bed mobility: Needs Assistance ?Bed Mobility: Supine to Sit ?  ?  ?Supine to sit: Supervision ?  ?  ?General bed mobility comments: was able to sit EOB to don socks prior to continuing mobility ?  ? ?Transfers ?Overall transfer level: Needs assistance ?Equipment used: Rolling walker (2 wheels) ?Transfers: Sit to/from Stand, Bed to chair/wheelchair/BSC ?Sit to Stand: Min guard ?Stand pivot transfers: Min guard ?  ?  ?  ?  ?General transfer comment: able to stand from elevated bed w/ CGA using RW ?  ? ?Ambulation/Gait ?Ambulation/Gait assistance: Min guard ?Gait Distance (Feet): 30 Feet (3 bouts of 32ft) ?Assistive device: Rolling walker (2 wheels) ?Gait Pattern/deviations: Decreased step length - right, Decreased step length - left, Step-to pattern, Decreased stride length ?Gait velocity: decreased ?  ?  ?  ? ? ?Stairs ?  ?  ?  ?  ?  ? ? ?Wheelchair Mobility ?  ? ?Modified Rankin (Stroke Patients Only) ?  ? ? ?  ?Balance Overall balance assessment: Needs assistance ?Sitting-balance support: Bilateral upper extremity supported, Feet supported ?Sitting balance-Leahy Scale: Fair ?  ?  ?Standing balance support: Bilateral upper extremity supported, During functional activity, Reliant on assistive device for balance ?Standing balance-Leahy Scale: Fair ?  ?  ?  ?  ?  ?  ?  ?  ?  ?  ?  ?  ?  ? ?  ?Cognition Arousal/Alertness: Awake/alert ?Behavior During Therapy: [facility] for tasks assessed/performed ?Overall Cognitive Status: Within Functional Limits  for tasks assessed ?  ?  ?  ?  ?  ?  ?  ?  ?  ?  ?  ?  ?  ?  ?  ?  ?  ?  ?  ? ?  ?Exercises   ? ?  ?General Comments   ?  ?  ? ?Pertinent Vitals/Pain Pain Assessment ?Pain Assessment: No/denies pain  ? ? ?Home Living   ?  ?  ?  ?  ?  ?  ?  ?  ?  ?   ?  ?Prior Function    ?  ?  ?   ? ?PT Goals  (current goals can now be found in the care plan section) Progress towards PT goals: Progressing toward goals ? ?  ?Frequency ? ? ? Min 2X/week ? ? ? ?  ?PT Plan Current plan remains appropriate  ? ? ?Co-evaluation   ?  ?  ?  ?  ? ?  ?AM-PAC PT "6 Clicks" Mobility   ?Outcome Measure ? Help needed turning from your back to your side while in a flat bed without using bedrails?: A Little ?Help needed moving from lying on your back to sitting on the side of a flat bed without using bedrails?: A Little ?Help needed moving to and from a bed to a chair (including a wheelchair)?: A Little ?Help needed standing up from a chair using your arms (e.g., wheelchair or bedside chair)?: A Little ?Help needed to walk in hospital room?: A Lot ?Help needed climbing 3-5 steps with a railing? : A Lot ?6 Click Score: 16 ? ?  ?End of Session Equipment Utilized During Treatment: Gait belt ?Activity Tolerance: Patient tolerated treatment well ?Patient left: in chair;with chair alarm set;with call bell/phone within reach ?Nurse Communication: Mobility status ?PT Visit Diagnosis: Muscle weakness (generalized) (M62.81);Difficulty in walking, not elsewhere classified (R26.2);Unsteadiness on feet (R26.81) ?  ? ? ?Time: 1104-1130 ?PT Time Calculation (min) (ACUTE ONLY): 26 min ? ?Charges:             ?          ? ?Jeralyn Bennett, SPT ?02/10/2022, 11:58 AM ? ?

## 2022-02-10 NOTE — TOC Progression Note (Signed)
Transition of Care (TOC) - Progression Note  ? ? ?Patient Details  ?Name: ASHKAN CHAMBERLAND ?MRN: [id number] ?Date of Birth: 06/25/1961 ? ?Transition of Care (TOC) CM/SW Contact  ?Allyse Fregeau A Alissia Lory, LCSW ?Phone Number: ?02/10/2022, 1:31 PM ? ?Clinical Narrative:   CSW spoke with pt and pt states he has talked to his girlfriend and Britta Mccreedy with CIR. Per Pt's his girlfriend had to get some information regarding the trailer to provide to [place] with [facility]. ? ? ? ?Expected Discharge Plan: IP Rehab Facility (CIR vs SNF) ?Barriers to Discharge: Continued Medical Work up ? ?Expected Discharge Plan and Services ?Expected Discharge Plan: IP Rehab Facility (CIR vs SNF) ?  ?  ?Post Acute Care Choice: Skilled Nursing Facility, IP Rehab ?Living arrangements for the past 2 months: Single Family Home ?                ?  ?  ?  ?  ?  ?  ?  ?  ?  ?  ? ? ?Social Determinants of Health (SDOH) Interventions ?  ? ?Readmission Risk Interventions ?No flowsheet data found. ? ?

## 2022-02-10 NOTE — Progress Notes (Signed)
Speech Language Pathology Treatment:    ?Patient Details ?Name: Kenneth Benson ?MRN: [id number] ?DOB: 01/19/1961 ?Today's Date: 02/10/2022 ?Time: 0925-0935 ?SLP Time Calculation (min) (ACUTE ONLY): 10 min ? ?Assessment / Plan / Recommendation ?Clinical Impression ? Pt seen for diet tolerance/trials of upgraded textures. Per SLP observation, pt report, and RN report, pt tolerating mech soft diet with thin liquids without overt s/sx pharyngeal dysphagia and adequate oral efficient. Difficulty noted with self-feeding given BUE weakness.  ? ?Per chart review, temp and WBC WNL. No recent chest imaging. ? ?Pt given trials of regular solid. Pt with mildly prolonged, but functional, mastication of solids. Likely baseline given edentulism. Adequate oral clearance achieved. Pt observed with sips of water via straw. Again, oral phase was functional.  No overt s/sx noted with solids or thin liquids.  ? ?From SLP perspective, pt OK to upgrade to a regular diet with thin liquids (meats chopped); however, pt politely declined upgrade stating he would prefer mech soft diet with thin liquids for ease of self-feeding given BUE weakness.  ? ?Given pt's preference, recommend continuation of mech soft diet with thin liquids and safe swallowing strategies/aspiration precautions as outlined below.  ? ?Pt and RN made aware of results, recommendations, and SLP POC. Pt verbalized understanding/agreement.  ? ?SLP to sign off as pt likely at or near swallowing baseline.  ? ?Of note, mild dysphonia persists with intermittently hoarse/hypophonic vocal quality. Pt may benefit from ENT consult if vocal changes persist. ?  ?HPI HPI: Per Physician's H&P on admission "This is a 60 yo male who presented to [facility] ER via EMS on 02/24 with c/o shortness of breath, cough, wheezing, and congestion onset of symptoms several days prior to presentation. He reported using his inhaler without relief of symptoms.  EMS reported pt severely hypoxic with O2 sats in the  60's.  He received duoneb's x2 and 125 mg of iv solumedrol en route to the ER.  All hx obtained from chart review pt currently sedated and mechanically intubated with no family at bedside." Pt intubated 2/24-3/11. ENT consulted for tracheostomy during this admission. ?  ?   ?SLP Plan ? All goals met ? ?  ?  ?Recommendations for follow up therapy are one component of a multi-disciplinary discharge planning process, led by the attending physician.  Recommendations may be updated based on patient status, additional functional criteria and insurance authorization. ?  ? ?Recommendations  ?Diet recommendations: Dysphagia 3 (mechanical soft);Thin liquid (per pt preference, given inability to cut food himself; Pt OK for upgrade to regular diet with thin liquids from SLP services) ?Medication Administration:  (as tolerated) ?Supervision: Trained caregiver to feed patient;Intermittent supervision to cue for compensatory strategies;Staff to assist with self feeding ?Compensations: Minimize environmental distractions;Slow rate;Small sips/bites;Follow solids with liquid ?Postural Changes and/or Swallow Maneuvers: Out of bed for meals;Seated upright 90 degrees;Upright 30-60 min after meal  ?   ?   ?   ? ? ? ? Oral Care Recommendations: Oral care QID ?Follow Up Recommendations:  (no f/u for swallow function) ?Assistance recommended at discharge: Frequent or constant Supervision/Assistance ?SLP Visit Diagnosis: Dysphagia, oropharyngeal phase (R13.12) ?Plan: All goals met ? ? ? ? ?  ?  ? ?Jennifer Mattia, M.S., CCC-SLP ?Speech-Language Pathologist ?[facility] ?[phone] (ASCOM)  ? ?Jennifer M Mattia ? ?02/10/2022, 11:12 AM ?

## 2022-02-10 NOTE — Progress Notes (Signed)
Occupational Therapy Treatment ?Patient Details ?Name: Kenneth Benson ?MRN: [id number] ?DOB: 12-Oct-1961 ?Today's Date: 02/10/2022 ? ? ?History of present illness Pt is a 61 y.o. male who presented to the ED on 2/24 w/ SOB and found to be in significant respiratory distress. Intubated on 2/24 due to decline, extuabted on 3/11. Developed A-Fib during hospital stay. PmHx: COPD, Asthma, Ongoing Tobacco Abuse. ?  ?OT comments ? Chart reviewed to date, pt greeted in chair agreeable to OT tx session. Tx session targeted progressing functional mobility and indep ADL completion. Pt demonstrates progress in LB dressing, all bathing tasks. Improved STS noted requiring sup-CGA with RW on 5 attempts. Pt is left as reiceved, NAD, all needs met. Pt continues to be a good candidate for AIR, OT will continue to follow acutely.   ? ?Recommendations for follow up therapy are one component of a multi-disciplinary discharge planning process, led by the attending physician.  Recommendations may be updated based on patient status, additional functional criteria and insurance authorization. ?   ?Follow Up Recommendations ? Acute inpatient rehab (3hours/day)  ?  ?Assistance Recommended at Discharge Frequent or constant Supervision/Assistance  ?Patient can return home with the following ? Two people to help with walking and/or transfers;Assist for transportation;Help with stairs or ramp for entrance;Direct supervision/assist for medications management;Direct supervision/assist for financial management;Assistance with cooking/housework;A lot of help with bathing/dressing/bathroom ?  ?Equipment Recommendations ? BSC/3in1  ?  ?Recommendations for Other Services   ? ?  ?Precautions / Restrictions Precautions ?Precautions: Fall ?Restrictions ?Weight Bearing Restrictions: No  ? ? ?  ? ?Mobility Bed Mobility ?  ?  ?  ?  ?  ?  ?  ?General bed mobility comments: in chair ?  ? ?Transfers ?Overall transfer level: Needs assistance ?Equipment used: Rolling  walker (2 wheels) ?Transfers: Sit to/from Stand ?Sit to Stand: Min guard, Supervision ?  ?  ?  ?  ?  ?General transfer comment: STS 5x with SUP-CGA, vcs for pacing ?  ?  ?Balance   ?  ?  ?  ?  ?  ?  ?  ?  ?  ?  ?  ?  ?  ?  ?  ?  ?  ?  ?   ? ?ADL either performed or assessed with clinical judgement  ? ?ADL Overall ADL's : Needs assistance/impaired ?  ?  ?  ?  ?  ?  ?  ?  ?  ?  ?  ?  ?  ?  ?  ?  ?  ?  ?  ?General ADL Comments: MIN A UB dressing, SET UP LB dressing (socks),MIN A for peri care in standing, MIN A for UB bathing, MIN A for LB bathing; ?  ? ?Extremity/Trunk Assessment   ?  ?  ?  ?  ?  ? ?Vision   ?  ?  ?Perception   ?  ?Praxis   ?  ? ?Cognition Arousal/Alertness: Awake/alert ?Behavior During Therapy: [facility] for tasks assessed/performed ?Overall Cognitive Status: Within Functional Limits for tasks assessed ?  ?  ?  ?  ?  ?  ?  ?  ?  ?  ?  ?  ?  ?  ?  ?  ?  ?  ?  ?   ?Exercises   ? ?  ?Shoulder Instructions   ? ? ?  ?General Comments HR up to 105 with activity, SPO2 >90% throughout  ? ? ?Pertinent Vitals/ Pain  Pain Assessment ?Pain Assessment: No/denies pain ? ?Home Living   ?  ?  ?  ?  ?  ?  ?  ?  ?  ?  ?  ?  ?  ?  ?  ?  ?  ?  ? ?  ?Prior Functioning/Environment    ?  ?  ?  ?   ? ?Frequency ? Min 3X/week  ? ? ? ? ?  ?Progress Toward Goals ? ?OT Goals(current goals can now be found in the care plan section) ? Progress towards OT goals: Progressing toward goals ? ?Acute Rehab OT Goals ?OT Goal Formulation: With patient ?Time For Goal Achievement: 02/24/22 ?Potential to Achieve Goals: Good  ?Plan Discharge plan remains appropriate;Frequency remains appropriate   ? ?Co-evaluation ? ? ?   ?  ?  ?  ?  ? ?  ?AM-PAC OT "6 Clicks" Daily Activity     ?Outcome Measure ? ? Help from another person eating meals?: A Little ?Help from another person taking care of personal grooming?: A Little ?Help from another person toileting, which includes using toliet, bedpan, or urinal?: A Little ?Help from another person bathing  (including washing, rinsing, drying)?: A Lot ?Help from another person to put on and taking off regular upper body clothing?: A Little ?Help from another person to put on and taking off regular lower body clothing?: A Little ?6 Click Score: 17 ? ?  ?End of Session Equipment Utilized During Treatment: Rolling walker (2 wheels) ? ?OT Visit Diagnosis: Muscle weakness (generalized) (M62.81) ?  ?Activity Tolerance Patient tolerated treatment well ?  ?Patient Left in chair;with call bell/phone within reach;with chair alarm set ?  ?Nurse Communication Mobility status ?  ? ?   ? ?Time: 2542-7062 ?OT Time Calculation (min): 23 min ? ?Charges: OT General Charges ?$OT Visit: 1 Visit ?OT Treatments ?$Self Care/Home Management : 8-22 mins ?$Therapeutic Activity: 8-22 mins ? ?Shanon Payor, OTD OTR/L  ?02/10/22, 1:32 PM  ?

## 2022-02-11 ENCOUNTER — Other Ambulatory Visit: Payer: Self-pay

## 2022-02-11 LAB — BASIC METABOLIC PANEL
Anion gap: 5 (ref 5–15)
BUN: 16 mg/dL (ref 6–20)
CO2: 29 mmol/L (ref 22–32)
Calcium: 8.3 mg/dL — ABNORMAL LOW (ref 8.9–10.3)
Chloride: 102 mmol/L (ref 98–111)
Creatinine, Ser: 0.65 mg/dL (ref 0.61–1.24)
GFR, Estimated: >60 mL/min (ref >60)
Glucose, Bld: 83 mg/dL (ref 70–99)
Potassium: 3.8 mmol/L (ref 3.5–5.1)
Sodium: 136 mmol/L (ref 135–145)

## 2022-02-11 LAB — CBC WITH DIFFERENTIAL/PLATELET
Abs Immature Granulocytes: 0.07 10*3/uL (ref 0.00–0.07)
Basophils Absolute: 0 10*3/uL (ref 0.0–0.1)
Basophils Relative: 0 %
Eosinophils Absolute: 0.1 10*3/uL (ref 0.0–0.5)
Eosinophils Relative: 2 %
HCT: 41.3 % (ref 39.0–52.0)
Hemoglobin: 13.6 g/dL (ref 13.0–17.0)
Immature Granulocytes: 1 %
Lymphocytes Relative: 34 %
Lymphs Abs: 2.3 10*3/uL (ref 0.7–4.0)
MCH: 30.4 pg (ref 26.0–34.0)
MCHC: 32.9 g/dL (ref 30.0–36.0)
MCV: 92.2 fL (ref 80.0–100.0)
Monocytes Absolute: 0.4 10*3/uL (ref 0.1–1.0)
Monocytes Relative: 6 %
Neutro Abs: 3.9 10*3/uL (ref 1.7–7.7)
Neutrophils Relative %: 57 %
Platelets: 333 10*3/uL (ref 150–400)
RBC: 4.48 MIL/uL (ref 4.22–5.81)
RDW: 13.7 % (ref 11.5–15.5)
WBC: 6.9 10*3/uL (ref 4.0–10.5)
nRBC: 0 % (ref 0.0–0.2)

## 2022-02-11 LAB — PHOSPHORUS: Phosphorus: 2.4 mg/dL — ABNORMAL LOW (ref 2.5–4.6)

## 2022-02-11 LAB — MAGNESIUM: Magnesium: 2.1 mg/dL (ref 1.7–2.4)

## 2022-02-11 MED ORDER — POTASSIUM & SODIUM PHOSPHATES 280-160-250 MG PO PACK
2.0000 | PACK | ORAL | Status: AC
  Administered 2022-02-11 (×2): 2 via ORAL
  Filled 2022-02-11 (×2): qty 2

## 2022-02-11 MED ORDER — AMIODARONE HCL 200 MG PO TABS
200.0000 mg | ORAL_TABLET | Freq: Two times a day (BID) | ORAL | 3 refills | Status: DC
Start: 2022-02-11 — End: 2022-03-15
  Filled 2022-02-11: qty 60, 30d supply, fill #0
  Filled 2022-03-14: qty 60, 30d supply, fill #1

## 2022-02-11 MED ORDER — METOPROLOL TARTRATE 25 MG PO TABS
25.0000 mg | ORAL_TABLET | Freq: Two times a day (BID) | ORAL | 3 refills | Status: DC
  Filled 2022-02-11: qty 60, 30d supply, fill #0
  Filled 2022-03-14: qty 60, 30d supply, fill #1
  Filled 2022-04-11: qty 60, 30d supply, fill #2
  Filled 2022-04-14: qty 54, 27d supply, fill #2
  Filled 2022-05-18: qty 60, 30d supply, fill #0

## 2022-02-11 MED ORDER — PREDNISONE 20 MG PO TABS
20.0000 mg | ORAL_TABLET | Freq: Every day | ORAL | Status: AC
  Administered 2022-02-13: 20 mg via ORAL
  Filled 2022-02-11: qty 1

## 2022-02-11 MED ORDER — APIXABAN 5 MG PO TABS
5.0000 mg | ORAL_TABLET | Freq: Two times a day (BID) | ORAL | Status: DC
Start: 2022-02-11 — End: 2022-02-13
  Administered 2022-02-11 – 2022-02-13 (×5): 5 mg via ORAL
  Filled 2022-02-11 (×5): qty 1

## 2022-02-11 MED ORDER — APIXABAN 5 MG PO TABS
5.0000 mg | ORAL_TABLET | Freq: Two times a day (BID) | ORAL | 3 refills | Status: DC
  Filled 2022-02-11: qty 60, 30d supply, fill #0
  Filled 2022-03-14: qty 60, 30d supply, fill #1
  Filled 2022-04-11 – 2022-04-14 (×2): qty 60, 30d supply, fill #2
  Filled 2022-05-18: qty 60, 30d supply, fill #0

## 2022-02-11 MED ORDER — PREDNISONE 50 MG PO TABS
25.0000 mg | ORAL_TABLET | Freq: Every day | ORAL | Status: AC
  Administered 2022-02-12: 25 mg via ORAL
  Filled 2022-02-11: qty 1

## 2022-02-11 MED ORDER — PREDNISONE 5 MG PO TABS
ORAL_TABLET | ORAL | 0 refills | Status: DC
  Filled 2022-02-11: qty 6, 3d supply, fill #0

## 2022-02-11 MED ORDER — ALBUTEROL SULFATE HFA 108 (90 BASE) MCG/ACT IN AERS
2.0000 | INHALATION_SPRAY | Freq: Four times a day (QID) | RESPIRATORY_TRACT | 3 refills | Status: DC | PRN
  Filled 2022-02-11: qty 6.7, 25d supply, fill #0
  Filled 2022-03-14: qty 6.7, 25d supply, fill #1
  Filled 2022-04-11 – 2022-04-14 (×2): qty 6.7, 25d supply, fill #2
  Filled 2022-05-18: qty 6.7, 25d supply, fill #0

## 2022-02-11 NOTE — Progress Notes (Signed)
Occupational Therapy Treatment ?Patient Details ?Name: Kenneth Benson ?MRN: [id number] ?DOB: 14-Dec-1960 ?Today's Date: 02/11/2022 ? ? ?History of present illness Pt is a 61 y.o. male who presented to the ED on 2/24 w/ SOB and found to be in significant respiratory distress. Intubated on 2/24 due to decline, extuabted on 3/11. Developed A-Fib during hospital stay. PmHx: COPD, Asthma, Ongoing Tobacco Abuse. ?  ?OT comments ? Pt seen for OT tx focused on preparing for upcoming discharge, anticipated for over the weekend. Pt instructed in home/routines modifications for ADL/IADL to support safe participation and minimize over exertion or falls including activity pacing, work simplification, and AE/DME. Pt requiring supervision for bed mobility, CGA for ADL transfers, and set up for seated LB ADL tasks. Pt making excellent progress towards goals. Discharge recommendation updated to reflect progress.   ? ?Recommendations for follow up therapy are one component of a multi-disciplinary discharge planning process, led by the attending physician.  Recommendations may be updated based on patient status, additional functional criteria and insurance authorization. ?   ?Follow Up Recommendations ? Home health OT  ?  ?Assistance Recommended at Discharge Frequent or constant Supervision/Assistance  ?Patient can return home with the following ? Assist for transportation;Help with stairs or ramp for entrance;Direct supervision/assist for medications management;Direct supervision/assist for financial management;Assistance with cooking/housework;A little help with walking and/or transfers;A little help with bathing/dressing/bathroom ?  ?Equipment Recommendations ? BSC/3in1  ?  ?Recommendations for Other Services   ? ?  ?Precautions / Restrictions Precautions ?Precautions: Fall ?Restrictions ?Weight Bearing Restrictions: No  ? ? ?  ? ?Mobility Bed Mobility ?Overal bed mobility: Needs Assistance, Modified Independent ?  ?  ?  ?  ?  ?   ?General bed mobility comments: supervision ?  ? ?Transfers ?  ?  ?  ?  ?  ?  ?  ?  ?  ?  ?  ?  ?Balance   ?  ?  ?  ?  ?  ?  ?  ?  ?  ?  ?  ?  ?  ?  ?  ?  ?  ?  ?   ? ?ADL either performed or assessed with clinical judgement  ? ?ADL Overall ADL's : Needs assistance/impaired ?  ?  ?  ?  ?  ?  ?  ?  ?  ?  ?  ?  ?  ?  ?  ?  ?  ?  ?  ?General ADL Comments: Pt requires CGA for ADL transfers, set up for LB ADL from seated position, PRN VC for incorporating learned ECS ?  ? ?Extremity/Trunk Assessment   ?  ?  ?  ?  ?  ? ?Vision   ?  ?  ?Perception   ?  ?Praxis   ?  ? ?Cognition Arousal/Alertness: Awake/alert ?Behavior During Therapy: [facility] for tasks assessed/performed ?Overall Cognitive Status: Within Functional Limits for tasks assessed ?  ?  ?  ?  ?  ?  ?  ?  ?  ?  ?  ?  ?  ?  ?  ?  ?  ?  ?  ?   ?Exercises Other Exercises ?Other Exercises: Pt instructed in home/routines modifications for ADL/IADL to support safe participation and minimize over exertion or falls including activity pacing, work simplification, and AE/DME. ? ?  ?Shoulder Instructions   ? ? ?  ?General Comments    ? ? ?Pertinent Vitals/ Pain         ? ?  Home Living   ?  ?  ?  ?  ?  ?  ?  ?  ?  ?  ?  ?  ?  ?  ?  ?  ?  ?  ? ?  ?Prior Functioning/Environment    ?  ?  ?  ?   ? ?Frequency ? Min 3X/week  ? ? ? ? ?  ?Progress Toward Goals ? ?OT Goals(current goals can now be found in the care plan section) ? Progress towards OT goals: Progressing toward goals ? ?Acute Rehab OT Goals ?Patient Stated Goal: get stronger ?OT Goal Formulation: With patient ?Time For Goal Achievement: 02/24/22 ?Potential to Achieve Goals: Good  ?Plan Discharge plan needs to be updated;Frequency remains appropriate   ? ?Co-evaluation ? ? ?   ?  ?  ?  ?  ? ?  ?AM-PAC OT "6 Clicks" Daily Activity     ?Outcome Measure ? ? Help from another person eating meals?: None ?Help from another person taking care of personal grooming?: A Little ?Help from another person toileting, which includes using  toliet, bedpan, or urinal?: A Little ?Help from another person bathing (including washing, rinsing, drying)?: A Little ?Help from another person to put on and taking off regular upper body clothing?: A Little ?Help from another person to put on and taking off regular lower body clothing?: A Little ?6 Click Score: 19 ? ?  ?End of Session   ? ?OT Visit Diagnosis: Muscle weakness (generalized) (M62.81) ?  ?Activity Tolerance Patient tolerated treatment well ?  ?Patient Left in bed;with call bell/phone within reach;with bed alarm set ?  ?Nurse Communication   ?  ? ?   ? ?Time: 0981-1914 ?OT Time Calculation (min): 11 min ? ?Charges: OT General Charges ?$OT Visit: 1 Visit ?OT Treatments ?$Self Care/Home Management : 8-22 mins ? ?Arman Filter., MPH, MS, OTR/L ?ascom [phone] ?02/11/22, 5:05 PM ?

## 2022-02-11 NOTE — Progress Notes (Signed)
Inpatient Rehabilitation Admissions Coordinator  ? ?Case discussed with Dr Riley Kill and Dr Carlis Abbott. Patient has progressed well with therapy and no longer in need of CIR level therapies before discharge home. I contacted TOC SW, Bridgett, and alerted acute team. We will sign off at this time. ? ?Ottie Glazier, RN, MSN ?Rehab Admissions Coordinator ?[phone] ?02/11/2022 10:32 AM ? ?

## 2022-02-11 NOTE — TOC Progression Note (Signed)
Transition of Care (TOC) - Progression Note  ? ? ?Patient Details  ?Name: Kenneth Benson ?MRN: [id number] ?Date of Birth: 1961-06-14 ? ?Transition of Care (TOC) CM/SW Contact  ?Izzak Fries A Brelee Renk, LCSW ?Phone Number: ?02/11/2022, 4:02 PM ? ?Clinical Narrative:   Medications provided to patient at bedside in preporation for dc this weekend. [facility] Pre-Eligibility Attestation form completed and signed by pt and faxed to [facility].  ? ? ? ?Expected Discharge Plan: IP Rehab Facility (CIR vs SNF) ?Barriers to Discharge: Continued Medical Work up ? ?Expected Discharge Plan and Services ?Expected Discharge Plan: IP Rehab Facility (CIR vs SNF) ?  ?  ?Post Acute Care Choice: Skilled Nursing Facility, IP Rehab ?Living arrangements for the past 2 months: Single Family Home ?                ?  ?  ?  ?  ?  ?  ?  ?  ?  ?  ? ? ?Social Determinants of Health (SDOH) Interventions ?  ? ?Readmission Risk Interventions ?No flowsheet data found. ? ?

## 2022-02-11 NOTE — Progress Notes (Addendum)
Physical Therapy Treatment ?Patient Details ?Name: Kenneth Benson ?MRN: [id number] ?DOB: 09/28/1961 ?Today's Date: 02/11/2022 ? ? ?History of Present Illness Pt is a 61 y.o. male who presented to the ED on 2/24 w/ SOB and found to be in significant respiratory distress. Intubated on 2/24 due to decline, extuabted on 3/11. Developed A-Fib during hospital stay. PmHx: COPD, Asthma, Ongoing Tobacco Abuse. ? ?  ?PT Comments  ? ? Pt awake and alert resting in bed upon PT entrance into room today. Pt denies any c/o pain and is willing to work w/ PT today. He is able to perform all bed mobility w/ SUPERVISION and is able to progress to sit to stand w/ CGA using RW and transfer into recliner.  He was able to ambulate ~138ft w/ CGA using RW over 3 bouts; PT led rest breaks due to focus on increase strength/endurance gradually and minimizing overall fatigue. Pt will benefit from continued skilled PT in order to increase LE strength/endurance, improve mobility/gait/balance, and restore PLOF. Current discharge recommendation remains appropriate due to the level of assistance required by the patient to ensure safety and improve overall function. ?  ?Recommendations for follow up therapy are one component of a multi-disciplinary discharge planning process, led by the attending physician.  Recommendations may be updated based on patient status, additional functional criteria and insurance authorization. ? ?Follow Up Recommendations ? Home health PT ?  ?  ?Assistance Recommended at Discharge Frequent or constant Supervision/Assistance  ?Patient can return home with the following A little help with walking and/or transfers;A little help with bathing/dressing/bathroom;Assistance with cooking/housework;Assist for transportation;Help with stairs or ramp for entrance ?  ?Equipment Recommendations ? Rolling walker (2 wheels)  ?  ?Recommendations for Other Services   ? ? ?  ?Precautions / Restrictions Precautions ?Precautions:  Fall ?Restrictions ?Weight Bearing Restrictions: No  ?  ? ?Mobility ? Bed Mobility ?Overal bed mobility: Needs Assistance ?Bed Mobility: Supine to Sit, Sit to Supine ?  ?  ?Supine to sit: Supervision ?Sit to supine: Supervision ?  ?  ?  ? ?Transfers ?Overall transfer level: Needs assistance ?Equipment used: Rolling walker (2 wheels) ?Transfers: Sit to/from Stand ?Sit to Stand: Min guard ?  ?  ?  ?  ?  ?  ?  ? ?Ambulation/Gait ?Ambulation/Gait assistance: Min guard ?Gait Distance (Feet): 120 Feet (3 bouts of 39ft) ?Assistive device: Rolling walker (2 wheels) ?Gait Pattern/deviations: Decreased step length - right, Decreased step length - left, Step-to pattern, Decreased stride length ?Gait velocity: decreased ?  ?  ?General Gait Details: narrow BOS; not scissoring but required verbal cues about keeping base wider ? ? ?Stairs ?  ?  ?  ?  ?  ? ? ?Wheelchair Mobility ?  ? ?Modified Rankin (Stroke Patients Only) ?  ? ? ?  ?Balance Overall balance assessment: Needs assistance ?Sitting-balance support: Bilateral upper extremity supported, Feet supported ?Sitting balance-Leahy Scale: Good ?  ?  ?Standing balance support: Bilateral upper extremity supported, During functional activity, Reliant on assistive device for balance ?Standing balance-Leahy Scale: Fair ?  ?  ?  ?  ?  ?  ?  ?  ?  ?  ?  ?  ?  ? ?  ?Cognition Arousal/Alertness: Awake/alert ?Behavior During Therapy: [facility] for tasks assessed/performed ?Overall Cognitive Status: Within Functional Limits for tasks assessed ?  ?  ?  ?  ?  ?  ?  ?  ?  ?  ?  ?  ?  ?  ?  ?  ?  ?  ?  ? ?  ?  Exercises   ? ?  ?General Comments   ?  ?  ? ?Pertinent Vitals/Pain Pain Assessment ?Pain Assessment: No/denies pain  ? ? ?Home Living   ?  ?  ?  ?  ?  ?  ?  ?  ?  ?   ?  ?Prior Function    ?  ?  ?   ? ?PT Goals (current goals can now be found in the care plan section) Progress towards PT goals: Progressing toward goals ? ?  ?Frequency ? ? ? Min 2X/week ? ? ? ?  ?PT Plan Discharge plan needs to  be updated  ? ? ?Co-evaluation   ?  ?  ?  ?  ? ?  ?AM-PAC PT "6 Clicks" Mobility   ?Outcome Measure ? Help needed turning from your back to your side while in a flat bed without using bedrails?: A Little ?Help needed moving from lying on your back to sitting on the side of a flat bed without using bedrails?: A Little ?Help needed moving to and from a bed to a chair (including a wheelchair)?: A Little ?Help needed standing up from a chair using your arms (e.g., wheelchair or bedside chair)?: A Little ?Help needed to walk in hospital room?: A Little ?Help needed climbing 3-5 steps with a railing? : A Lot ?6 Click Score: 17 ? ?  ?End of Session Equipment Utilized During Treatment: Gait belt ?Activity Tolerance: Patient tolerated treatment well ?Patient left: in bed;with call bell/phone within reach;with bed alarm set ?Nurse Communication: Mobility status ?PT Visit Diagnosis: Muscle weakness (generalized) (M62.81);Difficulty in walking, not elsewhere classified (R26.2);Unsteadiness on feet (R26.81) ?  ? ? ?Time: [id number] ?PT Time Calculation (min) (ACUTE ONLY): 27 min ? ?Charges:             ?          ? ? ?Jonnie Kind, SPT ?02/11/2022, 4:06 PM ? ?

## 2022-02-11 NOTE — Progress Notes (Signed)
Mobility Specialist - Progress Note ? ? 02/11/22 1100  ?Mobility  ?Activity Ambulated with assistance in room;Stood at bedside;Dangled on edge of bed  ?Level of Assistance Standby assist, set-up cues, supervision of patient - no hands on  ?Assistive Device Front wheel walker  ?Distance Ambulated (ft) 40 ft  ?Activity Response Tolerated well  ?$Mobility charge 1 Mobility  ? ? ?During mobility: 86-100 HR, BP, 97% SpO2 ? ?Pt supine upon arrival using RA. Pt completes bed mobility to sit EOB with supervision + extra time ---- HR at 100 sitting EOB and mild SOB noted. Completes STS with supervision and ambulates 2 bouts of 1ft before returning to bed. Completes 3 more STS -- tolerates well. Left with alarm set and needs in reach. ? ?Clarisa Schools ?Mobility Specialist ?02/11/22, 11:52 AM ? ? ?

## 2022-02-11 NOTE — Progress Notes (Signed)
?Progress Note ? ? ? ?Kenneth Benson   ?NID:[id number]  ?DOB: 02-19-1961  ?DOA: 01/21/2022     21 ?PCP: Pcp, No ? ?Initial CC: SOB ? ?Hospital Course: ?Kenneth Benson is a 61 year old male with PMH COPD, asthma, and ongoing tobacco abuse who presented to the emergency room on 2/24 with shortness of breath and found to be in significant respiratory distress.  Initially placed on BiPAP but started to decline and required mechanical ventilation.  ?Hospital course complicated by development of atrial fibrillation.  Cardiology consulted and patient placed on Cardizem and eventually amiodarone.  Required diuresis.  Finally after multiple attempts to wean off of ventilator, able to be fully extubated by 3/11. Transferred to hospitalist service starting 3/12. ? ?Interval History:  ?No events overnight.  Continues to progress well with physical therapy.  We discussed tentative discharge home on Sunday which he is amenable with. ? ?Assessment and Plan: ?* Asthma exacerbation ?-  Set off by rhinovirus/enterovirus.  Continue nebulizers.  Tapering steroids ? ?Acute respiratory failure with hypoxia and hypercapnia (HCC)-resolved as of 02/09/2022 ?- Secondary COPD and asthma exacerbations.  Able to be weaned off of ventilator.   ?- Solu-Medrol changed over to p.o. prednisone and tapering.  - Continue nebulizers.  ?- Able to be weaned to room air ? ?COPD exacerbation (HCC) ?- Considered precipitated by rhinovirus infection initially.  Later developed haemophilus as well ?-Continue steroids and breathing treatments ? ?A-fib (HCC) ?- New onset.  Cardiology following, appreciate assistance ?- Previously on Cardizem and amiodarone ?- Now maintained on Lopressor only with mild borderline bradycardia, continue monitoring for further medication adjustment as needed ?-Lopressor adjusted on 02/10/2022 due to worsening bradycardia.  Continue amiodarone.  Discussed with cardiology ? ?Dysphagia ?- Slowly improving after extubation ?-Currently on  dysphagia level 3 diet ? ?Oral thrush-resolved as of 02/09/2022 ?- Treated with Diflucan ? ?Physical deconditioning ?- Secondary to prolonged illness and being on the ventilator.   ?- followed by PT/OT ?-Has had progressive improvement over the past several days.  He may be too high functioning for CIR and we are now pursuing discharging home with home health if able ? ?Overweight (BMI 25.0-29.9) ?Meets criteria with BMI greater than 25 ? ?Tobacco abuse ?Counseled.  Declines nicotine patch ? ? ?Old records reviewed in assessment of this patient ? ?Antimicrobials: ? ? ?DVT prophylaxis:  ?SCDs Start: 01/21/22 0901 ? ? ?Code Status:   Code Status: Full Code ? ?Disposition Plan:  Home with [facility] 3/19 ?Status is: Inpt ? ?Objective: ?Blood pressure 106/67, pulse 83, temperature 97.6 ?F (36.4 ?C), temperature source Oral, resp. rate 16, height 6' (1.829 m), weight 96.6 kg, SpO2 95 %.  ?Examination:  ?Physical Exam ?Constitutional:   ?   Appearance: He is well-developed.  ?HENT:  ?   Head: Normocephalic and atraumatic.  ?Eyes:  ?   Extraocular Movements: Extraocular movements intact.  ?Cardiovascular:  ?   Rate and Rhythm: Normal rate and regular rhythm.  ?Pulmonary:  ?   Effort: Pulmonary effort is normal.  ?   Breath sounds: Normal breath sounds.  ?Abdominal:  ?   General: Bowel sounds are normal. There is no distension.  ?   Palpations: Abdomen is soft.  ?   Tenderness: There is no abdominal tenderness.  ?Musculoskeletal:     ?   General: Normal range of motion.  ?   Cervical back: Normal range of motion and neck supple.  ?Skin: ?   General: Skin is warm and dry.  ?Neurological:  ?  General: No focal deficit present.  ?   Mental Status: He is alert and oriented to person, place, and time.  ?Psychiatric:     ?   Mood and Affect: Mood normal.     ?   Behavior: Behavior normal.  ?  ? ?Consultants:  ?Cardiology ? ?Procedures:  ? ? ?Data Reviewed: ?Results for orders placed or performed during the hospital encounter of 01/21/22  (from the past 24 hour(s))  ?Magnesium     Status: None  ? Collection Time: 02/11/22  4:37 AM  ?Result Value Ref Range  ? Magnesium 2.1 1.7 - 2.4 mg/dL  ?Phosphorus     Status: Abnormal  ? Collection Time: 02/11/22  4:37 AM  ?Result Value Ref Range  ? Phosphorus 2.4 (L) 2.5 - 4.6 mg/dL  ?Basic metabolic panel     Status: Abnormal  ? Collection Time: 02/11/22  4:37 AM  ?Result Value Ref Range  ? Sodium 136 135 - 145 mmol/L  ? Potassium 3.8 3.5 - 5.1 mmol/L  ? Chloride 102 98 - 111 mmol/L  ? CO2 29 22 - 32 mmol/L  ? Glucose, Bld 83 70 - 99 mg/dL  ? BUN 16 6 - 20 mg/dL  ? Creatinine, Ser 0.65 0.61 - 1.24 mg/dL  ? Calcium 8.3 (L) 8.9 - 10.3 mg/dL  ? GFR, Estimated >60 >60 mL/min  ? Anion gap 5 5 - 15  ?CBC with Differential/Platelet     Status: None  ? Collection Time: 02/11/22  4:37 AM  ?Result Value Ref Range  ? WBC 6.9 4.0 - 10.5 K/uL  ? RBC 4.48 4.22 - 5.81 MIL/uL  ? Hemoglobin 13.6 13.0 - 17.0 g/dL  ? HCT 41.3 39.0 - 52.0 %  ? MCV 92.2 80.0 - 100.0 fL  ? MCH 30.4 26.0 - 34.0 pg  ? MCHC 32.9 30.0 - 36.0 g/dL  ? RDW 13.7 11.5 - 15.5 %  ? Platelets 333 150 - 400 K/uL  ? nRBC 0.0 0.0 - 0.2 %  ? Neutrophils Relative % 57 %  ? Neutro Abs 3.9 1.7 - 7.7 K/uL  ? Lymphocytes Relative 34 %  ? Lymphs Abs 2.3 0.7 - 4.0 K/uL  ? Monocytes Relative 6 %  ? Monocytes Absolute 0.4 0.1 - 1.0 K/uL  ? Eosinophils Relative 2 %  ? Eosinophils Absolute 0.1 0.0 - 0.5 K/uL  ? Basophils Relative 0 %  ? Basophils Absolute 0.0 0.0 - 0.1 K/uL  ? Immature Granulocytes 1 %  ? Abs Immature Granulocytes 0.07 0.00 - 0.07 K/uL  ?  ?I have Reviewed nursing notes, Vitals, and Lab results since pt's last encounter. Pertinent lab results : see above ?I have ordered test including BMP, CBC, Mg ?I have reviewed the last note from staff over past 24 hours ?I have discussed pt's care plan and test results with nursing staff, case manager ? ? LOS: 21 days  ? ?Kenneth Chamber, MD ?[facility] ?02/11/2022, 12:34 PM ? ?

## 2022-02-11 NOTE — TOC Progression Note (Signed)
Transition of Care (TOC) - Progression Note  ? ? ?Patient Details  ?Name: Kenneth Benson ?MRN: [id number] ?Date of Birth: [birth year] ? ?Transition of Care (TOC) CM/SW Contact  ?Taraann Olthoff A Barron Vanloan, LCSW ?Phone Number: ?02/11/2022, 10:08 AM ? ?Clinical Narrative:   Pt is progressing well with therapy. CIR is working on getting in touch with landlord to be sure pt has secured housing. Pt has no bed offers at this time.  ? ? ? ?Expected Discharge Plan: IP Rehab Facility (CIR vs SNF) ?Barriers to Discharge: Continued Medical Work up ? ?Expected Discharge Plan and Services ?Expected Discharge Plan: IP Rehab Facility (CIR vs SNF) ?  ?  ?Post Acute Care Choice: Skilled Nursing Facility, IP Rehab ?Living arrangements for the past 2 months: Single Family Home ?                ?  ?  ?  ?  ?  ?  ?  ?  ?  ?  ? ? ?Social Determinants of Health (SDOH) Interventions ?  ? ?Readmission Risk Interventions ?No flowsheet data found. ? ?

## 2022-02-12 LAB — BASIC METABOLIC PANEL
Anion gap: 6 (ref 5–15)
BUN: 17 mg/dL (ref 6–20)
CO2: 30 mmol/L (ref 22–32)
Calcium: 8.7 mg/dL — ABNORMAL LOW (ref 8.9–10.3)
Chloride: 101 mmol/L (ref 98–111)
Creatinine, Ser: 0.74 mg/dL (ref 0.61–1.24)
GFR, Estimated: >60 mL/min (ref >60)
Glucose, Bld: 129 mg/dL — ABNORMAL HIGH (ref 70–99)
Potassium: 5 mmol/L (ref 3.5–5.1)
Sodium: 137 mmol/L (ref 135–145)

## 2022-02-12 LAB — CBC WITH DIFFERENTIAL/PLATELET
Abs Immature Granulocytes: 0.07 10*3/uL (ref 0.00–0.07)
Basophils Absolute: 0 10*3/uL (ref 0.0–0.1)
Basophils Relative: 0 %
Eosinophils Absolute: 0.2 10*3/uL (ref 0.0–0.5)
Eosinophils Relative: 2 %
HCT: 42.5 % (ref 39.0–52.0)
Hemoglobin: 13.8 g/dL (ref 13.0–17.0)
Immature Granulocytes: 1 %
Lymphocytes Relative: 34 %
Lymphs Abs: 2.5 10*3/uL (ref 0.7–4.0)
MCH: 30.2 pg (ref 26.0–34.0)
MCHC: 32.5 g/dL (ref 30.0–36.0)
MCV: 93 fL (ref 80.0–100.0)
Monocytes Absolute: 0.4 10*3/uL (ref 0.1–1.0)
Monocytes Relative: 6 %
Neutro Abs: 4.1 10*3/uL (ref 1.7–7.7)
Neutrophils Relative %: 57 %
Platelets: 313 10*3/uL (ref 150–400)
RBC: 4.57 MIL/uL (ref 4.22–5.81)
RDW: 13.8 % (ref 11.5–15.5)
WBC: 7.4 10*3/uL (ref 4.0–10.5)
nRBC: 0 % (ref 0.0–0.2)

## 2022-02-12 LAB — MAGNESIUM: Magnesium: 2.1 mg/dL (ref 1.7–2.4)

## 2022-02-12 LAB — PHOSPHORUS: Phosphorus: 2.8 mg/dL (ref 2.5–4.6)

## 2022-02-12 NOTE — Progress Notes (Signed)
Physical Therapy Treatment ?Patient Details ?Name: Kenneth Benson ?MRN: [id number] ?DOB: May 02, 1961 ?Today's Date: 02/12/2022 ? ? ?History of Present Illness Pt is a 61 y.o. male who presented to the ED on 2/24 w/ SOB and found to be in significant respiratory distress. Intubated on 2/24 due to decline, extuabted on 3/11. Developed A-Fib during hospital stay. PmHx: COPD, Asthma, Ongoing Tobacco Abuse. ? ?  ?PT Comments  ? ? Pt was long sitting in bed upon arriving. He is A and O x 4 and agreeable to session. Easily able to exit bed, stand, and ambulate with use of RW. Pt was able to ambulate 200 ft without rest or safety concern/ Vitals were stable without desaturation or HR concerns. He does have R foot drop noted more so in RLE than L. Especially visible with stair training. MD ordered AFO for RLE. Author contacted brace company and stat order placed for brace to be delivered today. He has to excessively hip hike RLE to step up on stairs. Overall pt is doing well from a PT standpoint. Recommend DC home with HHPT to follow.  ? ?   ?Recommendations for follow up therapy are one component of a multi-disciplinary discharge planning process, led by the attending physician.  Recommendations may be updated based on patient status, additional functional criteria and insurance authorization. ? ?Follow Up Recommendations ? Home health PT ?  ?  ?Assistance Recommended at Discharge Frequent or constant Supervision/Assistance  ?Patient can return home with the following A little help with walking and/or transfers;A little help with bathing/dressing/bathroom;Assistance with cooking/housework;Assist for transportation;Help with stairs or ramp for entrance ?  ?Equipment Recommendations ? Rolling walker (2 wheels) ( AFO being delivered 02/12/22 per [facility] [employer])  ?  ?   ?Precautions / Restrictions Precautions ?Precautions: Fall ?Restrictions ?Weight Bearing Restrictions: No  ?  ? ?Mobility ? Bed Mobility ?Overal bed mobility:  Modified Independent ?Bed Mobility: Supine to Sit, Sit to Supine ?Rolling: Modified independent (Device/Increase time) ?  ?Supine to sit: Modified independent (Device/Increase time) ?Sit to supine: Modified independent (Device/Increase time) ?General bed mobility comments: no physical assistance required to exit bed. Bed surface was flat. ?  ? ?Transfers ?Overall transfer level: Needs assistance ?Equipment used: Rolling walker (2 wheels) ?Transfers: Sit to/from Stand ?Sit to Stand: Supervision ? General transfer comment: Pt was easily and safely able to stand to RW without LOB or safety concern ?  ? ?Ambulation/Gait ?Ambulation/Gait assistance: Supervision ?Gait Distance (Feet): 200 Feet ?Assistive device: Rolling walker (2 wheels) ?Gait Pattern/deviations: Step-through pattern, Trunk flexed, Narrow base of support ?Gait velocity: decreased ?  ?  ?General Gait Details: pt has no LOB but does have narrow BOS ? ? ?Stairs ?Stairs: Yes ?Stairs assistance: Supervision ?Stair Management: Two rails, Forwards ?Number of Stairs: 4 ?General stair comments: pt was able to ascend/descend FOS 2 x without difficulty opr safety concern. ? ?  ?Balance Overall balance assessment: Needs assistance ?Sitting-balance support: Bilateral upper extremity supported, Feet supported ?Sitting balance-Leahy Scale: Good ?  ?  ?Standing balance support: Bilateral upper extremity supported, During functional activity, Reliant on assistive device for balance ?Standing balance-Leahy Scale: Good ?Standing balance comment: no LOB with BUE support on RW ?  ?   ?Cognition Arousal/Alertness: Awake/alert ?Behavior During Therapy: [facility] for tasks assessed/performed ?Overall Cognitive Status: Within Functional Limits for tasks assessed ?  ? Safety/Judgement: Decreased awareness of safety, Decreased awareness of deficits ?  ?Problem Solving: Slow processing, Requires verbal cues, Requires tactile cues ?General Comments: Pt is A and  O x 4 ?  ?  ? ?  ?    ?General Comments General comments (skin integrity, edema, etc.): discussed need for AFO for additional safety. MD placed order for AFO. brace commpany contacted and planning to do stat order ?  ?  ? ?Pertinent Vitals/Pain Pain Assessment ?Pain Assessment: No/denies pain  ? ? ? ?PT Goals (current goals can now be found in the care plan section) Acute Rehab PT Goals ?Patient Stated Goal: go home ?Progress towards PT goals: Progressing toward goals ? ?  ?Frequency ? ? ? Min 2X/week ? ? ? ?  ?PT Plan Current plan remains appropriate  ? ? ?Co-evaluation   ?  ?PT goals addressed during session: Mobility/safety with mobility;Balance;Proper use of DME ?  ?  ? ?  ?AM-PAC PT "6 Clicks" Mobility   ?Outcome Measure ? Help needed turning from your back to your side while in a flat bed without using bedrails?: A Little ?Help needed moving from lying on your back to sitting on the side of a flat bed without using bedrails?: A Little ?Help needed moving to and from a bed to a chair (including a wheelchair)?: A Little ?Help needed standing up from a chair using your arms (e.g., wheelchair or bedside chair)?: A Little ?Help needed to walk in hospital room?: A Little ?Help needed climbing 3-5 steps with a railing? : A Little ?6 Click Score: 18 ? ?  ?End of Session Equipment Utilized During Treatment: Gait belt ?Activity Tolerance: Patient tolerated treatment well ?Patient left: in bed;with call bell/phone within reach;with bed alarm set ?Nurse Communication: Mobility status ?PT Visit Diagnosis: Muscle weakness (generalized) (M62.81);Difficulty in walking, not elsewhere classified (R26.2);Unsteadiness on feet (R26.81) ?  ? ? ?Time: 1607-3710 ?PT Time Calculation (min) (ACUTE ONLY): 25 min ? ?Charges:  $Gait Training: 23-37 mins          ?          ?Jetta Lout PTA ?02/12/22, 11:53 AM  ? ?

## 2022-02-12 NOTE — Progress Notes (Signed)
?Progress Note ? ? ? ?Eliezer Champagne   ?XBD:[id number]  ?DOB: March 10, 1961  ?DOA: 01/21/2022     22 ?PCP: Pcp, No ? ?Initial CC: SOB ? ?Hospital Course: ?Mr. Swift is a 61 year old male with PMH COPD, asthma, and ongoing tobacco abuse who presented to the emergency room on 2/24 with shortness of breath and found to be in significant respiratory distress.  Initially placed on BiPAP but started to decline and required mechanical ventilation.  ?Hospital course complicated by development of atrial fibrillation.  Cardiology consulted and patient placed on Cardizem and eventually amiodarone.  Required diuresis.  Finally after multiple attempts to wean off of ventilator, able to be fully extubated by 3/11. Transferred to hospitalist service starting 3/12. ? ?Interval History:  ?No events overnight.  Progressing well with therapy.  Received his medications yesterday from med management which I reviewed bedside this morning.  Plan is still for discharging home with home health tomorrow. ? ?Assessment and Plan: ?* Asthma exacerbation ?-  Set off by rhinovirus/enterovirus.  Continue nebulizers.  Tapering steroids ? ?Acute respiratory failure with hypoxia and hypercapnia (HCC)-resolved as of 02/09/2022 ?- Secondary COPD and asthma exacerbations.  Able to be weaned off of ventilator.   ?- Solu-Medrol changed over to p.o. prednisone and tapering.  - Continue nebulizers.  ?- Able to be weaned to room air ? ?COPD exacerbation (HCC) ?- Considered precipitated by rhinovirus infection initially.  Later developed haemophilus as well ?-Continue steroids and breathing treatments ? ?A-fib (HCC) ?- New onset.  Cardiology following, appreciate assistance ?- Previously on Cardizem and amiodarone ?- Now maintained on Lopressor only with mild borderline bradycardia, continue monitoring for further medication adjustment as needed ?-Lopressor adjusted on 02/10/2022 due to worsening bradycardia.  Continue amiodarone.  Discussed with cardiology ?-Will  continue amiodarone and Lopressor at discharge ?- Eliquis also started ? ?Dysphagia ?- Slowly improving after extubation ?-Currently on dysphagia level 3 diet ? ?Oral thrush-resolved as of 02/09/2022 ?- Treated with Diflucan ? ?Physical deconditioning ?- Secondary to prolonged illness and being on the ventilator.   ?- followed by PT/OT ?-Has had progressive improvement over the past several days.  He may be too high functioning for CIR and we are now pursuing discharging home with home health if able; plan is for home on Sunday with home health ? ?Overweight (BMI 25.0-29.9) ?Meets criteria with BMI greater than 25 ? ?Tobacco abuse ?Counseled.  Declines nicotine patch ? ? ?Old records reviewed in assessment of this patient ? ?Antimicrobials: ? ? ?DVT prophylaxis:  ?SCDs Start: 01/21/22 0901 ?apixaban (ELIQUIS) tablet 5 mg  ? ?Code Status:   Code Status: Full Code ? ?Disposition Plan:  Home with [facility] 3/19 ?Status is: Inpt ? ?Objective: ?Blood pressure 104/70, pulse 73, temperature 98.3 ?F (36.8 ?C), temperature source Oral, resp. rate 18, height 6' (1.829 m), weight 96.6 kg, SpO2 98 %.  ?Examination:  ?Physical Exam ?Constitutional:   ?   Appearance: He is well-developed.  ?HENT:  ?   Head: Normocephalic and atraumatic.  ?Eyes:  ?   Extraocular Movements: Extraocular movements intact.  ?Cardiovascular:  ?   Rate and Rhythm: Normal rate and regular rhythm.  ?Pulmonary:  ?   Effort: Pulmonary effort is normal.  ?   Breath sounds: Normal breath sounds.  ?Abdominal:  ?   General: Bowel sounds are normal. There is no distension.  ?   Palpations: Abdomen is soft.  ?   Tenderness: There is no abdominal tenderness.  ?Musculoskeletal:     ?  General: Normal range of motion.  ?   Cervical back: Normal range of motion and neck supple.  ?Skin: ?   General: Skin is warm and dry.  ?Neurological:  ?   General: No focal deficit present.  ?   Mental Status: He is alert and oriented to person, place, and time.  ?Psychiatric:     ?   Mood  and Affect: Mood normal.     ?   Behavior: Behavior normal.  ?  ? ?Consultants:  ?Cardiology ? ?Procedures:  ? ? ?Data Reviewed: ?Results for orders placed or performed during the hospital encounter of 01/21/22 (from the past 24 hour(s))  ?Magnesium     Status: None  ? Collection Time: 02/12/22  6:00 AM  ?Result Value Ref Range  ? Magnesium 2.1 1.7 - 2.4 mg/dL  ?Phosphorus     Status: None  ? Collection Time: 02/12/22  6:00 AM  ?Result Value Ref Range  ? Phosphorus 2.8 2.5 - 4.6 mg/dL  ?Basic metabolic panel     Status: Abnormal  ? Collection Time: 02/12/22  6:00 AM  ?Result Value Ref Range  ? Sodium 137 135 - 145 mmol/L  ? Potassium 5.0 3.5 - 5.1 mmol/L  ? Chloride 101 98 - 111 mmol/L  ? CO2 30 22 - 32 mmol/L  ? Glucose, Bld 129 (H) 70 - 99 mg/dL  ? BUN 17 6 - 20 mg/dL  ? Creatinine, Ser 0.74 0.61 - 1.24 mg/dL  ? Calcium 8.7 (L) 8.9 - 10.3 mg/dL  ? GFR, Estimated >60 >60 mL/min  ? Anion gap 6 5 - 15  ?CBC with Differential/Platelet     Status: None  ? Collection Time: 02/12/22  6:00 AM  ?Result Value Ref Range  ? WBC 7.4 4.0 - 10.5 K/uL  ? RBC 4.57 4.22 - 5.81 MIL/uL  ? Hemoglobin 13.8 13.0 - 17.0 g/dL  ? HCT 42.5 39.0 - 52.0 %  ? MCV 93.0 80.0 - 100.0 fL  ? MCH 30.2 26.0 - 34.0 pg  ? MCHC 32.5 30.0 - 36.0 g/dL  ? RDW 13.8 11.5 - 15.5 %  ? Platelets 313 150 - 400 K/uL  ? nRBC 0.0 0.0 - 0.2 %  ? Neutrophils Relative % 57 %  ? Neutro Abs 4.1 1.7 - 7.7 K/uL  ? Lymphocytes Relative 34 %  ? Lymphs Abs 2.5 0.7 - 4.0 K/uL  ? Monocytes Relative 6 %  ? Monocytes Absolute 0.4 0.1 - 1.0 K/uL  ? Eosinophils Relative 2 %  ? Eosinophils Absolute 0.2 0.0 - 0.5 K/uL  ? Basophils Relative 0 %  ? Basophils Absolute 0.0 0.0 - 0.1 K/uL  ? Immature Granulocytes 1 %  ? Abs Immature Granulocytes 0.07 0.00 - 0.07 K/uL  ?  ?I have Reviewed nursing notes, Vitals, and Lab results since pt's last encounter. Pertinent lab results : see above ?I have ordered test including BMP, CBC, Mg ?I have reviewed the last note from staff over past 24  hours ?I have discussed pt's care plan and test results with nursing staff, case manager ? ? LOS: 22 days  ? ?Lewie Chamber, MD ?[facility] ?02/12/2022, 2:07 PM ? ?

## 2022-02-13 NOTE — Discharge Summary (Signed)
?Physician Discharge Summary ?  ?Patient: DUKE WEISENSEL MRN: [id number] DOB: Apr 07, 1961  ?Admit date:     01/21/2022  ?Discharge date: 02/13/22  ?Discharge Physician: Lewie Chamber  ? ?PCP: Pcp, No  ? ?Recommendations at discharge:  ? ? Follow up with cardiology  ? ?Discharge Diagnoses: ?Principal Problem: ?  Asthma exacerbation ?Active Problems: ?  COPD exacerbation (HCC) ?  A-fib (HCC) ?  Dysphagia ?  Physical deconditioning ?  Overweight (BMI 25.0-29.9) ?  Tobacco abuse ? ?Resolved Problems: ?  Acute respiratory failure with hypoxia and hypercapnia (HCC) ?  Oral thrush ? ?Hospital Course: ?Mr. Raska is a 61 year old male with PMH COPD, asthma, and ongoing tobacco abuse who presented to the emergency room on 2/24 with shortness of breath and found to be in significant respiratory distress.  Initially placed on BiPAP but started to decline and required mechanical ventilation.  ?Hospital course complicated by development of atrial fibrillation.  Cardiology consulted and patient placed on Cardizem and eventually amiodarone.  Required diuresis.  Finally after multiple attempts to wean off of ventilator, able to be fully extubated by 3/11. ? ?Assessment and Plan: ?* Asthma exacerbation ?-  Set off by rhinovirus/enterovirus.  Continue nebulizers.  Tapering steroids ? ?Acute respiratory failure with hypoxia and hypercapnia (HCC)-resolved as of 02/09/2022 ?- Secondary COPD and asthma exacerbations.  Able to be weaned off of ventilator.   ?- Solu-Medrol changed over to p.o. prednisone and tapering ?- Able to be weaned to room air ? ?COPD exacerbation (HCC) ?- Considered precipitated by rhinovirus infection initially.  Later developed haemophilus as well ?-Continue steroids and breathing treatments ? ?A-fib (HCC) ?- New onset.  Cardiology following, appreciate assistance ?- Previously on Cardizem and amiodarone ?- Now maintained on Lopressor only with mild borderline bradycardia, continue monitoring for further medication  adjustment as needed ?-Lopressor adjusted on 02/10/2022 due to worsening bradycardia.  Continue amiodarone.  Discussed with cardiology ?-Will continue amiodarone and Lopressor at discharge ?- Eliquis also started ? ?Dysphagia ?- Slowly improving after extubation ? ?Oral thrush-resolved as of 02/09/2022 ?- Treated with Diflucan ? ?Physical deconditioning ?- Secondary to prolonged illness and being on the ventilator.   ?- followed by PT/OT ?-Has had progressive improvement over the past several days.  He may be too high functioning for CIR and we are now pursuing discharging home with home health if able; plan is for home on Sunday with home health ? ?Overweight (BMI 25.0-29.9) ?Meets criteria with BMI greater than 25 ? ?Tobacco abuse ?Counseled.  Declines nicotine patch ? ? ? ? ?  ? ?Consultants: Pulmonology, Cardiology ?Procedures performed:   ?Disposition: Home ?DISCHARGE MEDICATION: ?Allergies as of 02/13/2022   ?No Known Allergies ?  ? ?  ?Medication List  ?  ? ?STOP taking these medications   ? ?ibuprofen 600 MG tablet ?Commonly known as: ADVIL ?  ? ?  ? ?TAKE these medications   ? ?albuterol 108 (90 Base) MCG/ACT inhaler ?Commonly known as: Proventil HFA ?Inhale 2 puffs into the lungs every 6 (six) hours as needed for wheezing. ?  ?amiodarone 200 MG tablet ?Commonly known as: PACERONE ?Take 1 tablet (200 mg total) by mouth 2 (two) times daily. ?  ?Eliquis 5 MG Tabs tablet ?Generic drug: apixaban ?Take 1 tablet (5 mg total) by mouth 2 (two) times daily. ?  ?metoprolol tartrate 25 MG tablet ?Commonly known as: LOPRESSOR ?Take 1 tablet (25 mg total) by mouth 2 (two) times daily. ?  ?predniSONE 5 MG tablet ?Commonly known as: DELTASONE ?  Take 3 tablets once in the morning on Day 1, then 2 tablets once in the morning on Day 2, then 1 tablet in the morning on day 3. ?  ? ?  ? ?  ?  ? ? ?  ?Durable Medical Equipment  ?(From admission, onward)  ?  ? ? ?  ? ?  Start     Ordered  ? 02/13/22 0941  For home use only DME Walker  rolling  Once       ?Question Answer Comment  ?Walker: With 5 Inch Wheels   ?Patient needs a walker to treat with the following condition Generalized muscle weakness   ?  ? 02/13/22 0940  ? 02/13/22 0941  For home use only DME 3 n 1  Once       ? 02/13/22 0940  ? ?  ?  ? ?  ? ? Follow-up Information   ? ? Armando Reichertrgel, Ryan Matthew, MD. Go on 03/02/2022.   ?Specialty: Cardiology ?Why: 9 am ?Contact information: ?[address] ?[place] [address] ?[phone] ? ? ?  ?  ? ?  ?  ? ?  ? ?Discharge Exam: ?Filed Weights  ? 02/03/22 0825 02/09/22 0500 02/13/22 0500  ?Weight: 98.6 kg 96.6 kg 96.7 kg  ? ?Physical Exam ?Constitutional:   ?   Appearance: He is well-developed.  ?HENT:  ?   Head: Normocephalic and atraumatic.  ?Eyes:  ?   Extraocular Movements: Extraocular movements intact.  ?Cardiovascular:  ?   Rate and Rhythm: Normal rate and regular rhythm.  ?Pulmonary:  ?   Effort: Pulmonary effort is normal.  ?   Breath sounds: Normal breath sounds.  ?Abdominal:  ?   General: Bowel sounds are normal. There is no distension.  ?   Palpations: Abdomen is soft.  ?   Tenderness: There is no abdominal tenderness.  ?Musculoskeletal:     ?   General: Normal range of motion.  ?   Cervical back: Normal range of motion and neck supple.  ?Skin: ?   General: Skin is warm and dry.  ?Neurological:  ?   General: No focal deficit present.  ?   Mental Status: He is alert and oriented to person, place, and time.  ?Psychiatric:     ?   Mood and Affect: Mood normal.     ?   Behavior: Behavior normal.  ? ? ? ?Condition at discharge: stable ? ?The results of significant diagnostics from this hospitalization (including imaging, microbiology, ancillary and laboratory) are listed below for reference.  ? ?Imaging Studies: ?DG Chest 1 View ? ?Result Date: 01/21/2022 ?CLINICAL DATA:  Intubation EXAM: CHEST  1 VIEW COMPARISON:  None. FINDINGS: Endotracheal tube 4.7 cm from carina in good position. NG tube extends the stomach. Lungs are clear.  Normal  cardiac silhouette.  No pneumothorax. IMPRESSION: 1. Support apparatus in good position. 2. No acute cardiopulmonary findings. Electronically Signed   By: Genevive BiStewart  Edmunds M.D.   On: 01/21/2022 09:19  ? ?DG Abdomen 1 View ? ?Result Date: 01/21/2022 ?CLINICAL DATA:  A 61 year old male presents for evaluation of OG tube placement. EXAM: ABDOMEN - 1 VIEW COMPARISON:  Chest x-ray January 21, 2021 FINDINGS: EKG leads project over the patient's chest and abdomen. Bowel gas pattern not well assessed, abdomen incompletely imaged. Imaged focused over the lower chest and upper abdomen with OG tube tip in the gastric fundus. Side port not well delineated, potentially obscured by overlying EKG lead wires. IMPRESSION: OG tube tip  in the gastric fundus. Side port not well delineated on current imaging based on overlying EKG lead wires. The tube is approximately 7-8 cm into the stomach based on projection. Electronically Signed   By: Donzetta Kohut M.D.   On: 01/21/2022 09:20  ? ?CT HEAD WO CONTRAST ( ) ? ?Result Date: 01/30/2022 ?CLINICAL DATA:  61 year old male with shortness of breath, altered mental status. EXAM: CT HEAD WITHOUT CONTRAST TECHNIQUE: Contiguous axial images were obtained from the base of the skull through the vertex without intravenous contrast. RADIATION DOSE REDUCTION: This exam was performed according to the departmental dose-optimization program which includes automated exposure control, adjustment of the mA and/or kV according to patient size and/or use of iterative reconstruction technique. COMPARISON:  Head CT 11/21/2004. FINDINGS: Brain: Cerebral volume has not significantly changed since 2005, normal for age. No midline shift, ventriculomegaly, mass effect, evidence of mass lesion, intracranial hemorrhage or evidence of cortically based acute infarction. Gray-white matter differentiation is within normal limits throughout the brain. Vascular: No suspicious intracranial vascular hyperdensity. Skull: No  acute osseous abnormality identified. Sinuses/Orbits: Left tympanic cavity partially opacified. Right tympanic cavity is clear. Mild bilateral mastoid effusions. Sphenoid sinus fluid levels and bubbly opac

## 2022-02-13 NOTE — Plan of Care (Signed)

## 2022-02-13 NOTE — Discharge Instructions (Signed)
3 in 1 bedside commode and rolling walker delivered to the patient's room on 02/13/2022 ?

## 2022-02-13 NOTE — Progress Notes (Signed)
MD order received in [facility] to discharge pt home today; pt's ride present to take him home; TOC previously delivered DME including BSC 3n1 and rolling walker; TOC also had Medication Management fill pt's Rxs on Friday, 02/11/2022 and these were given to the pt to take home; verbally reviewed AVS with pt, no questions voiced at this time; pt discharged via wheelchair by this nurse to the medical mall entrance ?

## 2022-02-13 NOTE — TOC Transition Note (Addendum)
Transition of Care (TOC) - CM/SW Discharge Note ? ? ?Patient Details  ?Name: Kenneth Benson ?MRN: [id number] ?Date of Birth: Aug 10, 1961 ? ?Transition of Care (TOC) CM/SW Contact:  ?Bing Quarry, RN ?Phone Number: ?02/13/2022, 11:12 AM ? ? ?Clinical Narrative:  3/19: Patient has discharge to home/self care orders for today.  DME orders for 3:1 and RW. Patient does not have current insurance. DME provider per [facility] RW and 3:1, to be delivered to room by RN CM. Gabriel Cirri RN CM  ? ? ? ?Final next level of care: Home/Self Care ?Barriers to Discharge: Barriers Resolved ? ? ?Patient Goals and CMS Choice ?Patient states their goals for this hospitalization and ongoing recovery are:: to get better, to go to inpatient rehab ?  ?Choice offered to / list presented to : Patient ? ?Discharge Placement ?  ?           ?  ?  ?  ?  ? ?Discharge Plan and Services ?  ?  ?Post Acute Care Choice: Skilled Nursing Facility, IP Rehab          ?DME Arranged: 3-N-1, Walker rolling ?DME Agency: NA ([facility] to bill to [facility]. No insurance.) ?  ?  ?  ?HH Arranged: NA ?HH Agency: NA ?  ?  ?  ? ?Social Determinants of Health (SDOH) Interventions ?  ? ? ?Readmission Risk Interventions ?No flowsheet data found. ? ? ? ? ?

## 2022-02-25 ENCOUNTER — Other Ambulatory Visit: Payer: Self-pay

## 2022-02-25 ENCOUNTER — Emergency Department
Admission: EM | Admit: 2022-02-25 | Discharge: 2022-02-25 | Disposition: A | Discharge status: Home / Self Care | Payer: Self-pay | Attending: Emergency Medicine | Admitting: Emergency Medicine

## 2022-02-25 DIAGNOSIS — Z7901 Long term (current) use of anticoagulants: Secondary | ICD-10-CM | POA: Insufficient documentation

## 2022-02-25 DIAGNOSIS — J449 Chronic obstructive pulmonary disease, unspecified: Secondary | ICD-10-CM | POA: Insufficient documentation

## 2022-02-25 DIAGNOSIS — N3000 Acute cystitis without hematuria: Secondary | ICD-10-CM | POA: Insufficient documentation

## 2022-02-25 LAB — URINALYSIS, ROUTINE W REFLEX MICROSCOPIC
Bilirubin Urine: NEGATIVE
Glucose, UA: NEGATIVE mg/dL
Ketones, ur: NEGATIVE mg/dL
Nitrite: NEGATIVE
Protein, ur: 100 mg/dL — AB
RBC / HPF: >50 RBC/hpf — ABNORMAL HIGH (ref 0–5)
Specific Gravity, Urine: 1.013 (ref 1.005–1.030)
Squamous Epithelial / HPF: NONE SEEN (ref 0–5)
WBC, UA: >50 WBC/hpf — ABNORMAL HIGH (ref 0–5)
pH: 8 (ref 5.0–8.0)

## 2022-02-25 MED ORDER — CIPROFLOXACIN HCL 500 MG PO TABS
500.0000 mg | ORAL_TABLET | Freq: Once | ORAL | Status: AC
  Administered 2022-02-25: 500 mg via ORAL
  Filled 2022-02-25: qty 1

## 2022-02-25 MED ORDER — CIPROFLOXACIN HCL 500 MG PO TABS: 500.0000 mg | ORAL_TABLET | Freq: Two times a day (BID) | ORAL | 0 refills | Status: AC

## 2022-02-25 NOTE — ED Triage Notes (Addendum)
Patient to ER via [facility]. 2-3 weeks ago diagnosed with pneumonia. Patient states has been having several weeks of dark urine and dysuria since discharge. Patient states he had been intubated when admitted with pneumonia and had a foley catheter in place. Denies other symptoms.  ? ?EMS VSS.  ?

## 2022-02-25 NOTE — ED Provider Notes (Signed)
? ?[facility] ?Provider Note ? ? ? Event Date/Time  ? First MD Initiated Contact with Patient 02/25/22 1456   ?  (approximate) ? ? ?History  ? ?Dysuria ? ? ?HPI ? ?Kenneth Benson is a 61 y.o. male  with recent new onset Afib, started on Amiodarone, Metoprolol and Eliquis presents to the ER today via EMS with complaint of dysuria and dark urine. He reports this started almost 2 weeks after being discharged from this hospital for COPD exacerbation, requiring mechanical ventilation. He reports he had a Foley catheter and not sure if that caused his issues. He reports some associated right flank pain but denies abdominal pain, urinary urgency, frequency, blood in his urine, penile discharge, penile lesion or testicular pain or swelling.  He denies fever, chills or body aches.  He has not taken anything OTC for the symptoms.  He does feel like his respiratory symptoms have completely resolved.  He reports he has stopped smoking.  He reports he has not followed up with his PCP at this point because he does not have one. ? ?  ? ? ?Physical Exam  ? ?Triage Vital Signs: ?ED Triage Vitals  ?Enc Vitals Group  ?   BP 02/25/22 1434 123/64  ?   Pulse Rate 02/25/22 1434 100  ?   Resp 02/25/22 1434 18  ?   Temp 02/25/22 1434 98.1 ?F (36.7 ?C)  ?   Temp Source 02/25/22 1434 Oral  ?   SpO2 02/25/22 1434 95 %  ?   Weight 02/25/22 1448 213 lb 3 oz (96.7 kg)  ?   Height 02/25/22 1448 6' (1.829 m)  ?   Head Circumference --   ?   Peak Flow --   ?   Pain Score 02/25/22 1434 1  ?   Pain Loc --   ?   Pain Edu? --   ?   Excl. in GC? --   ? ? ?Most recent vital signs: ?Vitals:  ? 02/25/22 1434  ?BP: 123/64  ?Pulse: 100  ?Resp: 18  ?Temp: 98.1 ?F (36.7 ?C)  ?SpO2: 95%  ? ? ?General: Awake, no distress.  ?CV:  Irregular rhythm, tachycardic. No murmur noted. ?Resp:  Normal effort. CTA bilaterally with intermittent expiratory wheeze noted in the RLL. ?Abd:  Soft, non-tender. No CVA tenderness noted. ? ? ? ?ED Results /  Procedures / Treatments  ? ?Labs ? ?Labs Reviewed  ?URINALYSIS, ROUTINE W REFLEX MICROSCOPIC - Abnormal; Notable for the following components:  ?    Result Value  ? Color, Urine AMBER (*)   ? APPearance CLOUDY (*)   ? Hgb urine dipstick MODERATE (*)   ? Protein, ur 100 (*)   ? Leukocytes,Ua MODERATE (*)   ? RBC / HPF >50 (*)   ? WBC, UA >50 (*)   ? Bacteria, UA MANY (*)   ? All other components within normal limits  ? ? ? ? ?MEDICATIONS ORDERED IN ED: ?Meds ordered this encounter  ?Medications  ? ciprofloxacin (CIPRO) tablet 500 mg  ? ciprofloxacin (CIPRO) 500 MG tablet  ?  Sig: Take 1 tablet (500 mg total) by mouth 2 (two) times daily for 7 days.  ?  Dispense:  14 tablet  ?  Refill:  0  ?  Order Specific Question:   Supervising Provider  ?  AnswerJene Every [[id number]]  ? ? ? ?IMPRESSION / MDM / ASSESSMENT AND PLAN / ED COURSE  ?I reviewed the  triage vital signs and the nursing notes. ? ?Dark Urine and Dysuria: ? ?Differential diagnosis includes, but is not limited to, acute cystitis, interstitial cystitis, medication adverse effect, acute kidney stone. ? ?Prior hospital notes, labs and imaging reviewed ?Urinalysis consistent with UTI ?Cipro 500 mg PO x 1 (he reports he has no money at this time to pick up his RX) ?RX for Cipro 500 mg BID x 7 days ?Encouraged him to push fluids ?Information given to [facility] for followup ? ?FINAL CLINICAL IMPRESSION(S) / ED DIAGNOSES  ? ?Final diagnoses:  ?Acute cystitis without hematuria  ? ? ? ?Rx / DC Orders  ? ?ED Discharge Orders   ? ?      Ordered  ?  ciprofloxacin (CIPRO) 500 MG tablet  2 times daily       ? 02/25/22 1619  ? ?  ?  ? ?  ? ? ? ?Note:  This document was prepared using Dragon voice recognition software and may include unintentional dictation errors. ? ?  ?Lorre Munroe, NP ?02/25/22 1620 ? ?  ?Phineas Semen, MD ?02/25/22 1736 ? ?

## 2022-02-25 NOTE — Discharge Instructions (Addendum)
You were seen today for dysuria and dark urine.  Your urinalysis is consistent with a urinary tract infection.  We gave you your first dose of antibiotics in the ER.  I have given you a prescription for antibiotics twice daily for the next 7 days.  Please drink plenty of water to help flush out your urine.  [facility] for follow-up/establish care as a new primary care patient. ?

## 2022-03-01 ENCOUNTER — Other Ambulatory Visit: Payer: Self-pay

## 2022-03-14 ENCOUNTER — Other Ambulatory Visit: Payer: Self-pay

## 2022-03-15 ENCOUNTER — Other Ambulatory Visit: Payer: Self-pay

## 2022-03-16 ENCOUNTER — Other Ambulatory Visit: Payer: Self-pay

## 2022-03-18 ENCOUNTER — Other Ambulatory Visit: Payer: Self-pay

## 2022-03-28 ENCOUNTER — Ambulatory Visit: Payer: Self-pay | Admitting: Pharmacy Technician

## 2022-03-28 DIAGNOSIS — Z79899 Other long term (current) drug therapy: Secondary | ICD-10-CM

## 2022-03-28 NOTE — Progress Notes (Signed)
Completed [facility] application and contract.  Patient agreed to all terms of the [facility] contract.   ° °Patient approved to receive medication assistance at [facility] until time for re-certification in 2024, and as long as eligibility criteria continues to be met.   ° °Provided patient with community resource material based on his particular needs.   ° °Kairy Folsom J. Nefertari Rebman °Care Manager °[facility] °

## 2022-04-01 ENCOUNTER — Other Ambulatory Visit: Payer: Self-pay

## 2022-04-12 ENCOUNTER — Other Ambulatory Visit: Payer: Self-pay

## 2022-04-14 ENCOUNTER — Other Ambulatory Visit: Payer: Self-pay

## 2022-05-18 ENCOUNTER — Other Ambulatory Visit: Payer: Self-pay

## 2022-05-19 ENCOUNTER — Other Ambulatory Visit: Payer: Self-pay

## 2022-05-23 ENCOUNTER — Other Ambulatory Visit: Payer: Self-pay

## 2022-06-13 ENCOUNTER — Other Ambulatory Visit: Payer: Self-pay

## 2022-12-16 ENCOUNTER — Emergency Department: Payer: Medicaid Other

## 2022-12-16 ENCOUNTER — Other Ambulatory Visit: Payer: Self-pay

## 2022-12-16 ENCOUNTER — Inpatient Hospital Stay
Admission: EM | Admit: 2022-12-16 | Discharge: 2022-12-24 | DRG: 308 | Disposition: A | Discharge status: Home / Self Care | Payer: Medicaid Other | Attending: Internal Medicine | Admitting: Internal Medicine

## 2022-12-16 DIAGNOSIS — E875 Hyperkalemia: Secondary | ICD-10-CM | POA: Diagnosis present

## 2022-12-16 DIAGNOSIS — I5033 Acute on chronic diastolic (congestive) heart failure: Secondary | ICD-10-CM | POA: Diagnosis present

## 2022-12-16 DIAGNOSIS — Z7901 Long term (current) use of anticoagulants: Secondary | ICD-10-CM

## 2022-12-16 DIAGNOSIS — I4891 Unspecified atrial fibrillation: Secondary | ICD-10-CM

## 2022-12-16 DIAGNOSIS — N170 Acute kidney failure with tubular necrosis: Secondary | ICD-10-CM | POA: Diagnosis present

## 2022-12-16 DIAGNOSIS — I480 Paroxysmal atrial fibrillation: Principal | ICD-10-CM | POA: Diagnosis present

## 2022-12-16 DIAGNOSIS — E871 Hypo-osmolality and hyponatremia: Secondary | ICD-10-CM | POA: Diagnosis not present

## 2022-12-16 DIAGNOSIS — J9621 Acute and chronic respiratory failure with hypoxia: Secondary | ICD-10-CM | POA: Diagnosis present

## 2022-12-16 DIAGNOSIS — I4892 Unspecified atrial flutter: Secondary | ICD-10-CM | POA: Diagnosis present

## 2022-12-16 DIAGNOSIS — J9622 Acute and chronic respiratory failure with hypercapnia: Secondary | ICD-10-CM | POA: Diagnosis present

## 2022-12-16 DIAGNOSIS — T45516A Underdosing of anticoagulants, initial encounter: Secondary | ICD-10-CM | POA: Diagnosis present

## 2022-12-16 DIAGNOSIS — E119 Type 2 diabetes mellitus without complications: Secondary | ICD-10-CM | POA: Diagnosis present

## 2022-12-16 DIAGNOSIS — R350 Frequency of micturition: Secondary | ICD-10-CM | POA: Diagnosis present

## 2022-12-16 DIAGNOSIS — F17200 Nicotine dependence, unspecified, uncomplicated: Secondary | ICD-10-CM | POA: Diagnosis present

## 2022-12-16 DIAGNOSIS — Z79899 Other long term (current) drug therapy: Secondary | ICD-10-CM | POA: Diagnosis not present

## 2022-12-16 DIAGNOSIS — N179 Acute kidney failure, unspecified: Secondary | ICD-10-CM | POA: Diagnosis present

## 2022-12-16 DIAGNOSIS — J441 Chronic obstructive pulmonary disease with (acute) exacerbation: Secondary | ICD-10-CM | POA: Diagnosis present

## 2022-12-16 DIAGNOSIS — Z6836 Body mass index (BMI) 36.0-36.9, adult: Secondary | ICD-10-CM

## 2022-12-16 DIAGNOSIS — K4020 Bilateral inguinal hernia, without obstruction or gangrene, not specified as recurrent: Secondary | ICD-10-CM | POA: Diagnosis present

## 2022-12-16 DIAGNOSIS — Z1152 Encounter for screening for COVID-19: Secondary | ICD-10-CM | POA: Diagnosis not present

## 2022-12-16 DIAGNOSIS — J9601 Acute respiratory failure with hypoxia: Secondary | ICD-10-CM | POA: Diagnosis present

## 2022-12-16 DIAGNOSIS — R109 Unspecified abdominal pain: Secondary | ICD-10-CM

## 2022-12-16 DIAGNOSIS — Z91128 Patient's intentional underdosing of medication regimen for other reason: Secondary | ICD-10-CM

## 2022-12-16 DIAGNOSIS — G4733 Obstructive sleep apnea (adult) (pediatric): Secondary | ICD-10-CM | POA: Diagnosis present

## 2022-12-16 DIAGNOSIS — I483 Typical atrial flutter: Secondary | ICD-10-CM | POA: Diagnosis not present

## 2022-12-16 DIAGNOSIS — E663 Overweight: Secondary | ICD-10-CM | POA: Diagnosis present

## 2022-12-16 DIAGNOSIS — J449 Chronic obstructive pulmonary disease, unspecified: Secondary | ICD-10-CM | POA: Diagnosis present

## 2022-12-16 DIAGNOSIS — I5043 Acute on chronic combined systolic (congestive) and diastolic (congestive) heart failure: Secondary | ICD-10-CM | POA: Diagnosis present

## 2022-12-16 DIAGNOSIS — I071 Rheumatic tricuspid insufficiency: Secondary | ICD-10-CM | POA: Insufficient documentation

## 2022-12-16 DIAGNOSIS — I5042 Chronic combined systolic (congestive) and diastolic (congestive) heart failure: Secondary | ICD-10-CM | POA: Diagnosis present

## 2022-12-16 HISTORY — DX: Chronic obstructive pulmonary disease, unspecified: J44.9

## 2022-12-16 LAB — COMPREHENSIVE METABOLIC PANEL
ALT: 29 U/L (ref 0–44)
AST: 22 U/L (ref 15–41)
Albumin: 3.8 g/dL (ref 3.5–5.0)
Alkaline Phosphatase: 44 U/L (ref 38–126)
Anion gap: 8 (ref 5–15)
BUN: 21 mg/dL — ABNORMAL HIGH (ref 6–20)
CO2: 29 mmol/L (ref 22–32)
Calcium: 8.3 mg/dL — ABNORMAL LOW (ref 8.9–10.3)
Chloride: 105 mmol/L (ref 98–111)
Creatinine, Ser: 0.91 mg/dL (ref 0.61–1.24)
GFR, Estimated: >60 mL/min (ref >60)
Glucose, Bld: 111 mg/dL — ABNORMAL HIGH (ref 70–99)
Potassium: 3.9 mmol/L (ref 3.5–5.1)
Sodium: 142 mmol/L (ref 135–145)
Total Bilirubin: 1.3 mg/dL — ABNORMAL HIGH (ref 0.3–1.2)
Total Protein: 7 g/dL (ref 6.5–8.1)

## 2022-12-16 LAB — TROPONIN I (HIGH SENSITIVITY)
Troponin I (High Sensitivity): 30 ng/L — ABNORMAL HIGH (ref <18)
Troponin I (High Sensitivity): 33 ng/L — ABNORMAL HIGH (ref <18)

## 2022-12-16 LAB — CBC WITH DIFFERENTIAL/PLATELET
Abs Immature Granulocytes: 0.01 10*3/uL (ref 0.00–0.07)
Basophils Absolute: 0 10*3/uL (ref 0.0–0.1)
Basophils Relative: 0 %
Eosinophils Absolute: 0 10*3/uL (ref 0.0–0.5)
Eosinophils Relative: 0 %
HCT: 44.2 % (ref 39.0–52.0)
Hemoglobin: 13.7 g/dL (ref 13.0–17.0)
Immature Granulocytes: 0 %
Lymphocytes Relative: 10 %
Lymphs Abs: 0.9 10*3/uL (ref 0.7–4.0)
MCH: 29.5 pg (ref 26.0–34.0)
MCHC: 31 g/dL (ref 30.0–36.0)
MCV: 95.1 fL (ref 80.0–100.0)
Monocytes Absolute: 0.7 10*3/uL (ref 0.1–1.0)
Monocytes Relative: 8 %
Neutro Abs: 6.8 10*3/uL (ref 1.7–7.7)
Neutrophils Relative %: 82 %
Platelets: 178 10*3/uL (ref 150–400)
RBC: 4.65 MIL/uL (ref 4.22–5.81)
RDW: 16.4 % — ABNORMAL HIGH (ref 11.5–15.5)
WBC: 8.4 10*3/uL (ref 4.0–10.5)
nRBC: 0 % (ref 0.0–0.2)

## 2022-12-16 LAB — URINALYSIS, ROUTINE W REFLEX MICROSCOPIC
Bacteria, UA: NONE SEEN
Bilirubin Urine: NEGATIVE
Glucose, UA: NEGATIVE mg/dL
Hgb urine dipstick: NEGATIVE
Ketones, ur: NEGATIVE mg/dL
Leukocytes,Ua: NEGATIVE
Nitrite: NEGATIVE
Protein, ur: 30 mg/dL — AB
Specific Gravity, Urine: 1.026 (ref 1.005–1.030)
pH: 7 (ref 5.0–8.0)

## 2022-12-16 LAB — RESP PANEL BY RT-PCR (RSV, FLU A&B, COVID)  RVPGX2
Influenza A by PCR: NEGATIVE
Influenza B by PCR: NEGATIVE
Resp Syncytial Virus by PCR: NEGATIVE
SARS Coronavirus 2 by RT PCR: NEGATIVE

## 2022-12-16 LAB — BLOOD GAS, VENOUS
Acid-Base Excess: 1.3 mmol/L (ref 0.0–2.0)
Bicarbonate: 31.6 mmol/L — ABNORMAL HIGH (ref 20.0–28.0)
O2 Saturation: 52.1 %
Patient temperature: 37
pCO2, Ven: 79 mmHg (ref 44–60)
pH, Ven: 7.21 — ABNORMAL LOW (ref 7.25–7.43)
pO2, Ven: 39 mmHg (ref 32–45)

## 2022-12-16 LAB — LIPASE, BLOOD: Lipase: 30 U/L (ref 11–51)

## 2022-12-16 LAB — TSH: TSH: 1.505 u[IU]/mL (ref 0.350–4.500)

## 2022-12-16 LAB — LACTIC ACID, PLASMA: Lactic Acid, Venous: 1.3 mmol/L (ref 0.5–1.9)

## 2022-12-16 MED ORDER — DILTIAZEM HCL 25 MG/5ML IV SOLN
15.0000 mg | Freq: Once | INTRAVENOUS | Status: AC
  Administered 2022-12-16: 15 mg via INTRAVENOUS
  Filled 2022-12-16: qty 5

## 2022-12-16 MED ORDER — ONDANSETRON HCL 4 MG/2ML IJ SOLN: 4.0000 mg | Freq: Four times a day (QID) | INTRAMUSCULAR | Status: DC | PRN

## 2022-12-16 MED ORDER — IOHEXOL 350 MG/ML SOLN
100.0000 mL | Freq: Once | INTRAVENOUS | Status: AC | PRN
  Administered 2022-12-16: 100 mL via INTRAVENOUS

## 2022-12-16 MED ORDER — IPRATROPIUM-ALBUTEROL 0.5-2.5 (3) MG/3ML IN SOLN
3.0000 mL | Freq: Once | RESPIRATORY_TRACT | Status: AC
  Administered 2022-12-16: 3 mL via RESPIRATORY_TRACT
  Filled 2022-12-16: qty 3

## 2022-12-16 MED ORDER — DILTIAZEM HCL 25 MG/5ML IV SOLN
25.0000 mg | Freq: Once | INTRAVENOUS | Status: AC
  Administered 2022-12-16: 25 mg via INTRAVENOUS
  Filled 2022-12-16: qty 5

## 2022-12-16 MED ORDER — PREDNISONE 20 MG PO TABS
60.0000 mg | ORAL_TABLET | Freq: Once | ORAL | Status: AC
  Administered 2022-12-16: 60 mg via ORAL
  Filled 2022-12-16: qty 3

## 2022-12-16 MED ORDER — FUROSEMIDE 10 MG/ML IJ SOLN
40.0000 mg | Freq: Once | INTRAMUSCULAR | Status: AC
  Administered 2022-12-16: 40 mg via INTRAVENOUS
  Filled 2022-12-16: qty 4

## 2022-12-16 MED ORDER — SODIUM CHLORIDE 0.9 % IV BOLUS
1000.0000 mL | Freq: Once | INTRAVENOUS | Status: AC
  Administered 2022-12-16: 1000 mL via INTRAVENOUS

## 2022-12-16 MED ORDER — VANCOMYCIN HCL 2000 MG/400ML IV SOLN
2000.0000 mg | Freq: Once | INTRAVENOUS | Status: AC
  Administered 2022-12-16: 2000 mg via INTRAVENOUS
  Filled 2022-12-16: qty 400

## 2022-12-16 MED ORDER — ONDANSETRON HCL 4 MG/2ML IJ SOLN
4.0000 mg | Freq: Once | INTRAMUSCULAR | Status: AC
  Administered 2022-12-16: 4 mg via INTRAVENOUS
  Filled 2022-12-16: qty 2

## 2022-12-16 MED ORDER — DILTIAZEM HCL 25 MG/5ML IV SOLN
20.0000 mg | Freq: Once | INTRAVENOUS | Status: AC
  Administered 2022-12-16: 20 mg via INTRAVENOUS
  Filled 2022-12-16: qty 5

## 2022-12-16 MED ORDER — DILTIAZEM HCL-DEXTROSE 125-5 MG/125ML-% IV SOLN (PREMIX)
5.0000 mg/h | INTRAVENOUS | Status: DC
  Administered 2022-12-16: 5 mg/h via INTRAVENOUS
  Administered 2022-12-17: 7.5 mg/h via INTRAVENOUS
  Filled 2022-12-16 (×2): qty 125

## 2022-12-16 MED ORDER — APIXABAN 5 MG PO TABS
5.0000 mg | ORAL_TABLET | Freq: Two times a day (BID) | ORAL | Status: DC
  Administered 2022-12-16 – 2022-12-24 (×16): 5 mg via ORAL
  Filled 2022-12-16 (×16): qty 1

## 2022-12-16 MED ORDER — PIPERACILLIN-TAZOBACTAM 3.375 G IVPB 30 MIN
3.3750 g | Freq: Once | INTRAVENOUS | Status: AC
  Administered 2022-12-16: 3.375 g via INTRAVENOUS
  Filled 2022-12-16: qty 50

## 2022-12-16 MED ORDER — MORPHINE SULFATE (PF) 4 MG/ML IV SOLN
4.0000 mg | Freq: Once | INTRAVENOUS | Status: AC
  Administered 2022-12-16: 4 mg via INTRAVENOUS
  Filled 2022-12-16: qty 1

## 2022-12-16 MED ORDER — METOPROLOL TARTRATE 25 MG PO TABS
25.0000 mg | ORAL_TABLET | Freq: Two times a day (BID) | ORAL | Status: DC
  Administered 2022-12-17: 25 mg via ORAL
  Filled 2022-12-16: qty 1

## 2022-12-16 MED ORDER — METHYLPREDNISOLONE SODIUM SUCC 40 MG IJ SOLR
40.0000 mg | Freq: Two times a day (BID) | INTRAMUSCULAR | Status: DC
  Administered 2022-12-16 – 2022-12-18 (×4): 40 mg via INTRAVENOUS
  Filled 2022-12-16 (×4): qty 1

## 2022-12-16 MED ORDER — METOPROLOL TARTRATE 5 MG/5ML IV SOLN
2.5000 mg | INTRAVENOUS | Status: AC
  Filled 2022-12-16 (×2): qty 5

## 2022-12-16 MED ORDER — IPRATROPIUM-ALBUTEROL 0.5-2.5 (3) MG/3ML IN SOLN
3.0000 mL | Freq: Four times a day (QID) | RESPIRATORY_TRACT | Status: DC | PRN
  Administered 2022-12-18 – 2022-12-19 (×2): 3 mL via RESPIRATORY_TRACT
  Filled 2022-12-16 (×2): qty 3

## 2022-12-16 MED ORDER — ACETAMINOPHEN 325 MG PO TABS
650.0000 mg | ORAL_TABLET | ORAL | Status: DC | PRN
  Administered 2022-12-18 – 2022-12-21 (×2): 650 mg via ORAL
  Filled 2022-12-16 (×2): qty 2

## 2022-12-16 MED ORDER — FUROSEMIDE 10 MG/ML IJ SOLN
40.0000 mg | Freq: Two times a day (BID) | INTRAMUSCULAR | Status: DC
  Administered 2022-12-16 – 2022-12-17 (×2): 40 mg via INTRAVENOUS
  Filled 2022-12-16 (×2): qty 4

## 2022-12-16 MED ORDER — ACETAMINOPHEN 500 MG PO TABS
1000.0000 mg | ORAL_TABLET | Freq: Once | ORAL | Status: AC
  Administered 2022-12-16: 1000 mg via ORAL
  Filled 2022-12-16: qty 2

## 2022-12-16 NOTE — Hospital Course (Addendum)
62 year old male with a known history of COPD, asthma, ongoing tobacco abuse, A-fib is admitted for rapid A-fib/flutter and acute hypoxic respiratory failure requiring BiPAP Patient was seen cardiology and intensivist, was placed on Cardizem and metoprolol for rate control.  He also developed acute kidney injury and hyperkalemia.  Renal function so far has normalized. 1/24.  Patient appears to be significantly volume overloaded, renal function has normalized.  Restart IV Lasix 40 mg twice a day.

## 2022-12-16 NOTE — ED Notes (Signed)
Pt brought some OJ and is currently resting in no obvious distress. Pt denies any other needs at this time.

## 2022-12-16 NOTE — Consult Note (Signed)
PHARMACY -  BRIEF ANTIBIOTIC NOTE   Pharmacy has received consult(s) for Vancomycin from an ED provider.  The patient's profile has been reviewed for ht/wt/allergies/indication/available labs.    One time order(s) placed for Vancomycin 2g IV x 1 dose.  Further antibiotics/pharmacy consults should be ordered by admitting physician if indicated.                       Thank you, Pearla Dubonnet 12/16/2022  2:12 PM

## 2022-12-16 NOTE — ED Notes (Signed)
Pt given some soda to drink at this time.

## 2022-12-16 NOTE — Assessment & Plan Note (Addendum)
Likely due to COPD exacerbation and some fluid overload, received bolus of fluid in the ED. initially required BiPAP so was admitted for stepdown but later weaned off BiPAP to high flow nasal cannula so changed to PCU.  PCCM following Chest x-ray Cardiomegaly with pulmonary interstitial edema and likely trace effusions. Findings may reflect CHF.

## 2022-12-16 NOTE — ED Notes (Signed)
Pt wanting to go to sleep, RT called to place pt back on bipap.

## 2022-12-16 NOTE — Assessment & Plan Note (Addendum)
He was first diagnosed with A-fib in February 2023 after a COPD flareup.  He had follow-up with Dr. Donnelly Angelica at [facility] in April 2023 -He had lost follow-up since then. His heart rate here was 120-130 and has been weaned off Cardizem drip and started on oral metoprolol and Cardizem for rate control Continue Eliquis 5 mg p.o. twice daily -2D echo pending Cardio following

## 2022-12-16 NOTE — ED Provider Notes (Signed)
5:23 PM Assumed care for off going team.   Blood pressure 105/83, pulse (!) 125, temperature 98.1 F (36.7 C), temperature source Oral, resp. rate (!) 28, SpO2 91 %.  See their HPI for full report but in brief pending CT imaging.  Patient with a flutter with RVR.  Patient given a dill drip and placed on a Dilt infusion but even with a Dilt infusion his heart rates have gone back up to 150.  I reevaluated patient he looks much more comfortable on the BiPAP.  He denies any abdominal pain however heart rates are consistently 150 with some borderline blood pressures.  I give another 20 of IV diltiazem and heart rates came down to 120 we will try to increase his diltiazem drip.  CT imaging was reassuring.  Discussed with patient and his abdomen is soft and nontender at this time.  He reports that he thinks he just ate something bad from a restaurant.  We discussed that his lymph nodes that are enlarged needing follow-up for CT chest.  He expressed understanding.    After the dilt bolus his heart rates have come down and we were able to go up on his diltiazem drip.  He seems more comfortable on the BiPAP.  I have discussed with the hospital team for admission.  Marland Kitchen1-3 Lead EKG Interpretation  Performed by: Vanessa [place], MD Authorized by: Vanessa [place], MD     Interpretation: abnormal     ECG rate:  150   ECG rate assessment: tachycardic     Rhythm: atrial flutter     Ectopy: none     Conduction: normal   .Critical Care  Performed by: Vanessa [place], MD Authorized by: Vanessa , MD   Critical care provider statement:    Critical care time (minutes):  30   Critical care was necessary to treat or prevent imminent or life-threatening deterioration of the following conditions:  Cardiac failure   Critical care was time spent personally by me on the following activities:  Development of treatment plan with patient or surrogate, discussions with consultants, evaluation of patient's response to  treatment, examination of patient, ordering and review of laboratory studies, ordering and review of radiographic studies, ordering and performing treatments and interventions, pulse oximetry, re-evaluation of patient's condition and review of old charts           Vanessa , MD 12/16/22 1757

## 2022-12-16 NOTE — Assessment & Plan Note (Addendum)
Chest x-ray showing cardiomegaly with pulmonary interstitial edema and likely trace effusions. Findings may reflect CHF.  Received Lasix yesterday but now Holding Lasix as his kidney function worsened Strict I's and O's Daily weight Pending 2D echo Cardiology following Waiting for PCU bed

## 2022-12-16 NOTE — Assessment & Plan Note (Addendum)
Continue IV Solu-Medrol Weaned off BiPAP to high flow nasal cannula Continue inhalers and nebulizers COVID and influenza negative PCCM following

## 2022-12-16 NOTE — ED Provider Notes (Signed)
[facility] Provider Note    Event Date/Time   First MD Initiated Contact with Patient 12/16/22 1234     (approximate)   History   Tachycardia (Patient at work site today, c/o right flank pain and tachycardia starting today. Also c/o wheezing/difficulty breathing with hx COPD.) and Flank Pain   HPI  Kenneth Benson is a 62 y.o. male   Past medical history of COPD, tobacco use, asthma, elevated BMI, atrial fibrillation supposed to be on anticoagulation but self discontinued for approximately 6 months due to inability to obtain, who presents to the emergency department with acute onset right-sided abdominal pain while at his worksite today.  He works as a Nature conservation officer.  Nausea but no vomiting.  Denies fever or chills.  Has had urinary frequency.  No changes in bowel movements or GI bleeding.  He has chronic shortness of breath that is exacerbated by workplace irritations including dust, and has some shortness of breath today.  Independent Historian contributed to assessment above: EMS  External Medical Documents Reviewed: Cardiology visit in 2023 for new onset atrial fibrillation for which he was prescribed Eliquis, plan was to discontinue amiodarone because anticipated lost to follow-up      Physical Exam   Triage Vital Signs: ED Triage Vitals  Enc Vitals Group     BP 12/16/22 1259 123/74     Pulse Rate 12/16/22 1258 (!) 155     Resp 12/16/22 1258 (!) 22     Temp 12/16/22 1258 98.1 F (36.7 C)     Temp Source 12/16/22 1258 Oral     SpO2 12/16/22 1258 (!) 88 %     Weight --      Height --      Head Circumference --      Peak Flow --      Pain Score 12/16/22 1248 7     Pain Loc --      Pain Edu? --      Excl. in [place]? --     Most recent vital signs: Vitals:   12/16/22 1500 12/16/22 1522  BP: 107/83   Pulse: (!) 157 (!) 128  Resp: 20   Temp:    SpO2: (!) 89%     General: Awake, no distress.  CV:  Good peripheral perfusion.   Resp:  Normal effort. Abd:  No distention.  Other:  Soft nondistended abdomen but exquisitely tender to palpation mostly on the right side right upper quadrant and lower quadrant.  Skin appears warm well-perfused patient is awake alert and oriented and is comfortable enough to stand up at bedside to change his clothes.  Lungs with diffuse wheezing throughout, increased respiratory rate at 22.  No hypoxemia on room air.  He is tachycardic to 150 but normotensive.   ED Results / Procedures / Treatments   Labs (all labs ordered are listed, but only abnormal results are displayed) Labs Reviewed  CBC WITH DIFFERENTIAL/PLATELET - Abnormal; Notable for the following components:      Result Value   RDW 16.4 (*)    All other components within normal limits  COMPREHENSIVE METABOLIC PANEL - Abnormal; Notable for the following components:   Glucose, Bld 111 (*)    BUN 21 (*)    Calcium 8.3 (*)    Total Bilirubin 1.3 (*)    All other components within normal limits  URINALYSIS, ROUTINE W REFLEX MICROSCOPIC - Abnormal; Notable for the following components:   Color, Urine YELLOW (*)  APPearance CLEAR (*)    Protein, ur 30 (*)    All other components within normal limits  BLOOD GAS, VENOUS - Abnormal; Notable for the following components:   pH, Ven 7.21 (*)    pCO2, Ven 79 (*)    Bicarbonate 31.6 (*)    All other components within normal limits  TROPONIN I (HIGH SENSITIVITY) - Abnormal; Notable for the following components:   Troponin I (High Sensitivity) 30 (*)    All other components within normal limits  RESP PANEL BY RT-PCR (RSV, FLU A&B, COVID)  RVPGX2  LIPASE, BLOOD  LACTIC ACID, PLASMA  TROPONIN I (HIGH SENSITIVITY)     I ordered and reviewed the above labs they are notable for WBC wnl, Cr wnl, neg viral panel, No UTI on UA  EKG  ED ECG REPORT I, Pilar Jarvis, the attending physician, personally viewed and interpreted this ECG.   Date: 12/16/2022  EKG Time: 1257  Rate: 156   Rhythm: Sinus tachycardia vs AF, unclear P waves but seems regular  Axis: nl  Intervals:none  ST&T Change: No acute ischemic changes    RADIOLOGY I independently reviewed and interpreted chest x-ray and see diffuse opacities   PROCEDURES:  Critical Care performed: Yes, see critical care procedure note(s)  .Critical Care  Performed by: Pilar Jarvis, MD Authorized by: Pilar Jarvis, MD   Critical care provider statement:    Critical care time (minutes):  30   Critical care was necessary to treat or prevent imminent or life-threatening deterioration of the following conditions:  Respiratory failure (-year-old fibrillation RVR requiring IV medications)   Critical care was time spent personally by me on the following activities:  Development of treatment plan with patient or surrogate, discussions with consultants, evaluation of patient's response to treatment, examination of patient, ordering and review of laboratory studies, ordering and review of radiographic studies, ordering and performing treatments and interventions, pulse oximetry, re-evaluation of patient's condition and review of old charts    MEDICATIONS ORDERED IN ED: Medications  vancomycin (VANCOREADY) IVPB 2000 mg/400 mL (2,000 mg Intravenous New Bag/Given 12/16/22 1456)  ipratropium-albuterol (DUONEB) 0.5-2.5 (3) MG/3ML nebulizer solution 3 mL (3 mLs Nebulization Given 12/16/22 1312)  morphine (PF) 4 MG/ML injection 4 mg (4 mg Intravenous Given 12/16/22 1312)  sodium chloride 0.9 % bolus 1,000 mL (0 mLs Intravenous Stopped 12/16/22 1417)  ondansetron (ZOFRAN) injection 4 mg (4 mg Intravenous Given 12/16/22 1312)  predniSONE (DELTASONE) tablet 60 mg (60 mg Oral Given 12/16/22 1321)  piperacillin-tazobactam (ZOSYN) IVPB 3.375 g (0 g Intravenous Stopped 12/16/22 1524)  diltiazem (CARDIZEM) injection 15 mg (15 mg Intravenous Given 12/16/22 1417)  acetaminophen (TYLENOL) tablet 1,000 mg (1,000 mg Oral Given 12/16/22 1417)  iohexol  (OMNIPAQUE) 350 MG/ML injection 100 mL (100 mLs Intravenous Contrast Given 12/16/22 1432)  sodium chloride 0.9 % bolus 1,000 mL (0 mLs Intravenous Stopped 12/16/22 1520)  diltiazem (CARDIZEM) injection 25 mg (25 mg Intravenous Given 12/16/22 1514)  furosemide (LASIX) injection 40 mg (40 mg Intravenous Given 12/16/22 1514)     IMPRESSION / MDM / ASSESSMENT AND PLAN / ED COURSE  I reviewed the triage vital signs and the nursing notes.                                Patient's presentation is most consistent with acute presentation with potential threat to life or bodily function.  Differential diagnosis includes, but is not limited to, acute  mesenteric ischemia, intra-abdominal infection, obstruction, urinary tract infection.  As to his respiratory complaints COPD exacerbation, CHF exacerbation, asthma exacerbation, respiratory infection, dehydration or pain related tachycardia versus atrial fibrillation with RVR.   The patient is on the cardiac monitor to evaluate for evidence of arrhythmia and/or significant heart rate changes.  MDM:    Concern for mesenteric ischemia versus intra-abdominal infection or obstruction given tenderness to palpation of the abdomen, increased risk for mesenteric ischemia given atrial fibrillation no longer on anticoagulation.  Getting a CT angiogram of the abdomen and pelvis to further assess.  Had some urinary frequency as well check a urinalysis.  Respiratory complaints with wheezing consistent with COPD exacerbation will treat with DuoNebs and prednisone.  Check chest x-ray and viral swabs.  His tachycardia may be due to pain, dehydration, infection, COPD exacerbation or atrial fibrillation with RVR.  Unclear P waves on his initial EKG and it seems regular, will treat pain with morphine and give IV fluids as well as treat his COPD exacerbation and then reassess heart rate fortunately he is normotensive.  If rate does not slow or otherwise appears to be atrial  fibrillation repeat EKG can then consider pharmacologic rate control.  Meds  at this time is includes IV crystalloid bolus, IV antiemetic & IV morphine.   3:35 PM HR remains 150+ rpt EKG looks like AF RVR and rate refractory to morphine for pain control (pain much improved now) and 1L crystalloid. Will give 15mg  IV diltizaem for rate control.   Pain minimal after morphine. CTA pending.   Now hypoxia 86-88% on increasing [place] reqs so transitioned to bipap given COPD exacerbation, VBG pending. Speaking full sentences and mentation is still very good awake alert.   He started to rigor and have chills. No fever. Give tylenol and started broad abx given concern for intraabdominal infx, tachy, rigors.   CXR ?pulmonary edema but he doesn't appear clinically fluid overloaded, no orthopnea and his dyspnea more likely related to obvious COPD exacerbation - he did get more hypoxic after fluids so will try to diurese now   Increased dose of diltiazem 25mg  w response HR now in 120-130s will give another IV dose 30mg  and infusion for RVR   Con't doing well on bipap/       FINAL CLINICAL IMPRESSION(S) / ED DIAGNOSES   Final diagnoses:  COPD with acute exacerbation (HCC)  Abdominal pain, acute  Atrial fibrillation with rapid ventricular response ([place])     Rx / DC Orders   ED Discharge Orders     None        Note:  This document was prepared using Dragon voice recognition software and may include unintentional dictation errors.    Lucillie Garfinkel, MD 12/16/22 1535

## 2022-12-16 NOTE — H&P (Addendum)
History and Physical    Patient: Kenneth Benson MWU:[id number] DOB: Sep 30, 1961 DOA: 12/16/2022 DOS: the patient was seen and examined on 12/16/2022 PCP: Pcp, No  Patient coming from: Home  Chief Complaint:  Chief Complaint  Patient presents with   Tachycardia    Patient at work site today, c/o right flank pain and tachycardia starting today. Also c/o wheezing/difficulty breathing with hx COPD.   Flank Pain   HPI: [name] is a 62 y.o. male with medical history significant of asthma, COPD, ongoing tobacco abuse, atrial fibrillation came in for right-sided abdominal pain.  While he was at work he started having tachycardia shortness of breath and difficulty breathing.  Decided to come to the emergency department while in the ED he underwent CT abdomen as CDP was concerned about mesenteric ischemia but was ruled out.  It did show large bilateral inguinal hernia.  He does have mediastinal lymphadenopathy as well.  He was found to be in rapid A-fib/flutter with a heart rate in 150 and was given Cardizem bolus and started on Cardizem drip.  He also became hypoxic with oxygen saturations in mid 80s requiring BiPAP while in the ED.  Chest x-ray was showing pulmonary edema and CHF pattern so was given Lasix.  He is being admitted for further evaluation management. Review of Systems: As mentioned in the history of present illness. All other systems reviewed and are negative. Past Medical History:  Diagnosis Date   Asthma    COPD (chronic obstructive pulmonary disease) ([place])    Tobacco abuse    History reviewed. No pertinent surgical history. Social History:  reports that he has been smoking. He does not have any smokeless tobacco history on file. He reports that he does not drink alcohol and does not use drugs.  No Known Allergies  History reviewed. No pertinent family history.  Prior to Admission medications   Medication Sig Start Date End Date Taking? Authorizing Provider  albuterol  (PROVENTIL HFA) 108 (90 Base) MCG/ACT inhaler Inhale 2 puffs into the lungs every 6 (six) hours as needed for wheezing. 02/11/22   Dwyane Dee, MD  apixaban (ELIQUIS) 5 MG TABS tablet Take 1 tablet (5 mg total) by mouth 2 (two) times daily. 02/11/22 06/18/22  Dwyane Dee, MD  metoprolol tartrate (LOPRESSOR) 25 MG tablet Take 1 tablet (25 mg total) by mouth 2 (two) times daily. 02/11/22   Dwyane Dee, MD  predniSONE (DELTASONE) 5 MG tablet Take 3 tablets once in the morning on Day 1, then 2 tablets once in the morning on Day 2, then 1 tablet in the morning on day 3. 02/11/22   Dwyane Dee, MD  amiodarone (PACERONE) 200 MG tablet Take 1 tablet (200 mg total) by mouth 2 (two) times daily. 02/11/22 03/15/22  Dwyane Dee, MD    Physical Exam: Vitals:   12/16/22 1600 12/16/22 1630 12/16/22 1657 12/16/22 1726  BP: 108/79 105/83  115/72  Pulse: 81 (!) 153 (!) 125 (!) 119  Resp: 14 (!) 28  (!) 25  Temp:      TempSrc:      SpO2: 93% 42%  47%   62 year old disheveled looking man laying in the bed in no acute respiratory distress Lungs decreased breath sounds at the bases, bilateral expiratory wheezing and rales.  Using accessory muscles of respirations while on BiPAP Cardiovascular irregularly irregular heart rate, tachycardic Abdomen soft, obese, benign Neuro alert and awake, nonfocal Psych normal mood and affect Skin no obvious rash or lesion Extremities 2-3 +  Pedal edema Data Reviewed:  CT scan of the abdomen showing mesenteric adenopathy and bilateral inguinal hernia  Assessment and Plan: * Atrial flutter ([place]) He was first diagnosed with A-fib/flutter in February 2023 after a COPD flareup.  He had follow-up with Dr. Donnelly Angelica at [facility] in April 2023 -He had lost follow-up since then. His heart rate here was 140-150 and has been started on Cardizem drip which we will continue Start Eliquis 5 mg p.o. twice daily -Start metoprolol 25 mg twice daily -Cardiology consultation with  [place] 2D echo   Acute hypoxic respiratory failure ([employer]) Likely due to COPD exacerbation and some fluid overload, received bolus of fluid in the ED.  Currently BiPAP will admit him to stepdown and wean off BiPAP.  Consult intensivist Chest x-ray Cardiomegaly with pulmonary interstitial edema and likely trace effusions. Findings may reflect CHF.   COPD exacerbation (HCC) Start IV Solu-Medrol Wean off BiPAP to nasal cannula oxygen And inhalers and nebulizers COVID and influenza negative  Acute on chronic diastolic CHF (congestive heart failure) (HCC) Chest x-ray showing cardiomegaly with pulmonary interstitial edema and likely trace effusions. Findings may reflect CHF. Will start Lasix 40 mg IV twice daily Strict I's and O's Daily weight Obtain 2D echo Cardiology consult Monitor on telemetry while in stepdown      Advance Care Planning:   Code Status: Full Code   Consults: Cardiology, intensivist  Family Communication: None at bedside  Severity of Illness: The appropriate patient status for this patient is OBSERVATION. Observation status is judged to be reasonable and necessary in order to provide the required intensity of service to ensure the patient's safety. The patient's presenting symptoms, physical exam findings, and initial radiographic and laboratory data in the context of their medical condition is felt to place them at decreased risk for further clinical deterioration. Furthermore, it is anticipated that the patient will be medically stable for discharge from the hospital within 2 midnights of admission.   Author: Max Sane, MD 12/16/2022 5:51 PM  For on call review [email or web address].

## 2022-12-17 ENCOUNTER — Inpatient Hospital Stay: Payer: Medicaid Other

## 2022-12-17 DIAGNOSIS — J9601 Acute respiratory failure with hypoxia: Secondary | ICD-10-CM | POA: Diagnosis not present

## 2022-12-17 DIAGNOSIS — N179 Acute kidney failure, unspecified: Secondary | ICD-10-CM

## 2022-12-17 DIAGNOSIS — J441 Chronic obstructive pulmonary disease with (acute) exacerbation: Secondary | ICD-10-CM | POA: Diagnosis not present

## 2022-12-17 DIAGNOSIS — E875 Hyperkalemia: Secondary | ICD-10-CM | POA: Diagnosis present

## 2022-12-17 DIAGNOSIS — I4891 Unspecified atrial fibrillation: Secondary | ICD-10-CM

## 2022-12-17 DIAGNOSIS — E663 Overweight: Secondary | ICD-10-CM | POA: Diagnosis not present

## 2022-12-17 HISTORY — DX: Hyperkalemia: E87.5

## 2022-12-17 HISTORY — DX: Acute kidney failure, unspecified: N17.9

## 2022-12-17 LAB — CBC
HCT: 47.5 % (ref 39.0–52.0)
Hemoglobin: 14.1 g/dL (ref 13.0–17.0)
MCH: 29.4 pg (ref 26.0–34.0)
MCHC: 29.7 g/dL — ABNORMAL LOW (ref 30.0–36.0)
MCV: 99 fL (ref 80.0–100.0)
Platelets: 199 10*3/uL (ref 150–400)
RBC: 4.8 MIL/uL (ref 4.22–5.81)
RDW: 16.7 % — ABNORMAL HIGH (ref 11.5–15.5)
WBC: 10.3 10*3/uL (ref 4.0–10.5)
nRBC: 0 % (ref 0.0–0.2)

## 2022-12-17 LAB — BASIC METABOLIC PANEL
Anion gap: 6 (ref 5–15)
Anion gap: 7 (ref 5–15)
BUN: 30 mg/dL — ABNORMAL HIGH (ref 6–20)
BUN: 34 mg/dL — ABNORMAL HIGH (ref 6–20)
CO2: 28 mmol/L (ref 22–32)
CO2: 28 mmol/L (ref 22–32)
Calcium: 7.7 mg/dL — ABNORMAL LOW (ref 8.9–10.3)
Calcium: 7.4 mg/dL — ABNORMAL LOW (ref 8.9–10.3)
Chloride: 102 mmol/L (ref 98–111)
Chloride: 97 mmol/L — ABNORMAL LOW (ref 98–111)
Creatinine, Ser: 1.56 mg/dL — ABNORMAL HIGH (ref 0.61–1.24)
Creatinine, Ser: 1.57 mg/dL — ABNORMAL HIGH (ref 0.61–1.24)
GFR, Estimated: 51 mL/min — ABNORMAL LOW (ref >60)
GFR, Estimated: 50 mL/min — ABNORMAL LOW (ref >60)
Glucose, Bld: 189 mg/dL — ABNORMAL HIGH (ref 70–99)
Glucose, Bld: 171 mg/dL — ABNORMAL HIGH (ref 70–99)
Potassium: 5.9 mmol/L — ABNORMAL HIGH (ref 3.5–5.1)
Potassium: 5.9 mmol/L — ABNORMAL HIGH (ref 3.5–5.1)
Sodium: 136 mmol/L (ref 135–145)
Sodium: 132 mmol/L — ABNORMAL LOW (ref 135–145)

## 2022-12-17 LAB — LIPID PANEL
Cholesterol: 150 mg/dL (ref 0–200)
HDL: 66 mg/dL (ref >40)
LDL Cholesterol: 79 mg/dL (ref 0–99)
Total CHOL/HDL Ratio: 2.3 RATIO
Triglycerides: 24 mg/dL (ref <150)
VLDL: 5 mg/dL (ref 0–40)

## 2022-12-17 LAB — MAGNESIUM: Magnesium: 2.2 mg/dL (ref 1.7–2.4)

## 2022-12-17 MED ORDER — BUDESONIDE 0.5 MG/2ML IN SUSP
0.5000 mg | Freq: Two times a day (BID) | RESPIRATORY_TRACT | Status: DC
  Administered 2022-12-17 – 2022-12-19 (×5): 0.5 mg via RESPIRATORY_TRACT
  Filled 2022-12-17 (×5): qty 2

## 2022-12-17 MED ORDER — METHYLPREDNISOLONE SODIUM SUCC 40 MG IJ SOLR: 40.0000 mg | Freq: Two times a day (BID) | INTRAMUSCULAR | Status: DC

## 2022-12-17 MED ORDER — METOPROLOL TARTRATE 50 MG PO TABS
50.0000 mg | ORAL_TABLET | Freq: Two times a day (BID) | ORAL | Status: DC
  Administered 2022-12-17 – 2022-12-21 (×8): 50 mg via ORAL
  Filled 2022-12-17 (×8): qty 1

## 2022-12-17 MED ORDER — METHYLPREDNISOLONE SODIUM SUCC 40 MG IJ SOLR
40.0000 mg | Freq: Once | INTRAMUSCULAR | Status: AC
  Administered 2022-12-17: 40 mg via INTRAVENOUS
  Filled 2022-12-17: qty 1

## 2022-12-17 MED ORDER — IPRATROPIUM-ALBUTEROL 0.5-2.5 (3) MG/3ML IN SOLN
3.0000 mL | RESPIRATORY_TRACT | Status: DC
  Administered 2022-12-17 – 2022-12-18 (×7): 3 mL via RESPIRATORY_TRACT
  Filled 2022-12-17 (×8): qty 3

## 2022-12-17 MED ORDER — DILTIAZEM HCL 30 MG PO TABS
60.0000 mg | ORAL_TABLET | Freq: Four times a day (QID) | ORAL | Status: DC
  Administered 2022-12-17 – 2022-12-20 (×11): 60 mg via ORAL
  Filled 2022-12-17: qty 1
  Filled 2022-12-17: qty 2
  Filled 2022-12-17 (×2): qty 1
  Filled 2022-12-17: qty 2
  Filled 2022-12-17 (×6): qty 1
  Filled 2022-12-17: qty 2

## 2022-12-17 MED ORDER — SODIUM ZIRCONIUM CYCLOSILICATE 5 G PO PACK
10.0000 g | PACK | Freq: Every day | ORAL | Status: DC
  Administered 2022-12-17 – 2022-12-18 (×2): 10 g via ORAL
  Filled 2022-12-17 (×3): qty 1

## 2022-12-17 NOTE — Assessment & Plan Note (Signed)
Complicates overall prognosis. 

## 2022-12-17 NOTE — Progress Notes (Signed)
PCCM asked to evaluate patient 1/19. Patient weaned off BiPAP late evening alert and awake, following commands and eating dinner without issues. Patient deemed candidate for PCU admission.   This AM, patient was placed on BiPap for presumed OSA. Patient has been weaned off BiPap, eating breakfast now. No Resp distress. Will obtain CXR and wait for cardiology assessment for afib. Unable to give lasix as patient kidney function wors ening.

## 2022-12-17 NOTE — ED Notes (Signed)
NP notified of pt occasional O2 saturation of 88%.  Incentive spirometry ordered.  Incentive spirometry successful and pt O2 saturation increased to 93%.

## 2022-12-17 NOTE — ED Notes (Signed)
MD's made aware of pts increase in HR.

## 2022-12-17 NOTE — Assessment & Plan Note (Signed)
Likely multifactorial from possible ATN due to hypotension, contrast-induced nephropathy, overdiuresis with Lasix .  Hold Lasix for now.  Nephrology consult

## 2022-12-17 NOTE — Consult Note (Signed)
NAME:  Kenneth Benson, MRN:  [id number], DOB:  August 07, 1961, LOS: 1  CHIEF COMPLAINT:  SOB  BRIEF SYNOPSIS +SMOKER, COPD, AFIB WITH RVR  History of Present Illness:   62 y.o. male with medical history significant of asthma, COPD, ongoing tobacco abuse, atrial fibrillation came in for right-sided abdominal pain.    While he was at work he started having tachycardia shortness of breath and difficulty breathing.    Decided to come to the emergency department while in the ED he underwent CT abdomen as CDP was concerned about mesenteric ischemia but was ruled out.    It did show large bilateral inguinal hernia.   He does have mediastinal lymphadenopathy as well.    He was found to be in rapid A-fib/flutter with a heart rate in 150 and was given Cardizem bolus and started on Cardizem drip.  He also became hypoxic with oxygen saturations in mid 80s requiring BiPAP while in the ED.  Chest x-ray was showing pulmonary edema and CHF pattern so was given Lasix.    PCCM consulted for bIPAP, last night patient was weaned off BiPAP and eating dinner, no major complaints last night 1/19.  Patient was seen this AM, after recommendation of bIPAP for presumed OSA This AM, patient is more SOB, creatinine worsening, on Cardizem infusion  +WHEEZING THIS AM WILL START STEROIDS AND NEB THERAPY CARDIOLOGY CONSULT PENDING      Significant Hospital Events: Including procedures, antibiotic start and stop dates in addition to other pertinent events   1/19 admitted for acute afib with RVR 1/20 wheezing and COPD exacerbation     Micro Data:  COVID/FLU/RSV NEG  Antimicrobials:   Antibiotics Given (last 72 hours)     Date/Time Action Medication Dose Rate   12/16/22 1418 New Bag/Given   piperacillin-tazobactam (ZOSYN) IVPB 3.375 g 3.375 g 100 mL/hr   12/16/22 1456 New Bag/Given   vancomycin (VANCOREADY) IVPB 2000 mg/400 mL 2,000 mg 200 mL/hr        Interim History / Subjective:  Alert and  awake SOB wheezing Ate breakfast no issues RT flank pain resolved    Objective   Blood pressure 125/76, pulse 88, temperature 98.6 F (37 C), resp. rate (!) 22, SpO2 95 %.    FiO2 (%):  [40 %-70 %] 70 %   Intake/Output Summary (Last 24 hours) at 12/17/2022 6213 Last data filed at 12/17/2022 0201 Gross per 24 hour  Intake 221.11 ml  Output 800 ml  Net -578.89 ml   There were no vitals filed for this visit.    Review of Systems: Gen:  Denies  fever, sweats, chills weight loss  HEENT: Denies blurred vision, double vision, ear pain, eye pain, hearing loss, nose bleeds, sore throat Cardiac:  No dizziness, chest pain or heaviness, chest tightness,edema, No JVD Resp:   No cough, -sputum production, +shortness of breath,+wheezing, -hemoptysis,  Other:  All other systems negative    Physical Examination:   General Appearance:mild resp distress  EYES PERRLA, EOM intact.   NECK Supple, No JVD Pulmonary: normal breath sounds,+ wheezing.  CardiovascularNormal S1,S2.  No m/r/g.   Abdomen: Benign, Soft, non-tender. ALL OTHER ROS ARE NEGATIVE    Labs/imaging that I havepersonally reviewed  (right click and "Reselect all SmartList Selections" daily)      ASSESSMENT AND PLAN SYNOPSIS  62 yo obese white male with acute SOB due to acute Afib with RVR and acute COPD exacerbation  Severe ACUTE Hypoxic and Hypercapnic Respiratory Failure bIPAP as needed  Oxygen as needed   COPD EXACERBATION -continue IV steroids as prescribed -continue NEB THERAPY as prescribed -morphine as needed -wean fio2 as needed and tolerated   CARDIAC FAILURE due to AFib with RVR on Cardizem infusion -oxygen as needed -Lasix as tolerated -follow up cardiac enzymes as indicated   CARDIAC ICU monitoring  ACUTE KIDNEY INJURY/Renal Failure -continue Foley Catheter-assess need -Avoid nephrotoxic agents -Follow urine output, BMP -Ensure adequate renal perfusion, optimize oxygenation -Renal  dose medications   Intake/Output Summary (Last 24 hours) at 12/17/2022 4268 Last data filed at 12/17/2022 0201 Gross per 24 hour  Intake 221.11 ml  Output 800 ml  Net -578.89 ml    ENDO - ICU hypoglycemic\Hyperglycemia protocol -check FSBS per protocol   GI GI PROPHYLAXIS as indicated  NUTRITIONAL STATUS DIET--> as tolerated Constipation protocol as indicated   ELECTROLYTES -follow labs as needed -replace as needed -pharmacy consultation and following   Labs   CBC: Recent Labs  Lab 12/16/22 1305 12/17/22 0615  WBC 8.4 10.3  NEUTROABS 6.8  --   HGB 13.7 14.1  HCT 44.2 47.5  MCV 95.1 99.0  PLT 178 341    Basic Metabolic Panel: Recent Labs  Lab 12/16/22 1305 12/17/22 0615  NA 142 136  K 3.9 5.9*  CL 105 102  CO2 29 28  GLUCOSE 111* 189*  BUN 21* 30*  CREATININE 0.91 1.56*  CALCIUM 8.3* 7.7*  MG  --  2.2   GFR: CrCl cannot be calculated (Unknown ideal weight.). Recent Labs  Lab 12/16/22 1305 12/17/22 0615  WBC 8.4 10.3  LATICACIDVEN 1.3  --     Liver Function Tests: Recent Labs  Lab 12/16/22 1305  AST 22  ALT 29  ALKPHOS 44  BILITOT 1.3*  PROT 7.0  ALBUMIN 3.8   Recent Labs  Lab 12/16/22 1305  LIPASE 30   No results for input(s): "AMMONIA" in the last 168 hours.  ABG    Component Value Date/Time   PHART 7.51 (H) 02/04/2022 0500   PCO2ART 55 (H) 02/04/2022 0500   PO2ART 59 (L) 02/04/2022 0500   HCO3 31.6 (H) 12/16/2022 1413   O2SAT 52.1 12/16/2022 1413      HbA1C: Hgb A1c MFr Bld  Date/Time Value Ref Range Status  01/21/2022 02:36 PM 4.9 4.8 - 5.6 % Final    Comment:    (NOTE) Pre diabetes:          5.7%-6.4%  Diabetes:              >6.4%  Glycemic control for   <7.0% adults with diabetes     Home Medications  Prior to Admission medications   Medication Sig Start Date End Date Taking? Authorizing Provider  albuterol (PROVENTIL HFA) 108 (90 Base) MCG/ACT inhaler Inhale 2 puffs into the lungs every 6 (six)  hours as needed for wheezing. 02/11/22  Yes Dwyane Dee, MD  apixaban (ELIQUIS) 5 MG TABS tablet Take 1 tablet (5 mg total) by mouth 2 (two) times daily. 02/11/22 06/18/22  Dwyane Dee, MD  metoprolol tartrate (LOPRESSOR) 25 MG tablet Take 1 tablet (25 mg total) by mouth 2 (two) times daily. Patient not taking: Reported on 12/16/2022 02/11/22   Dwyane Dee, MD  predniSONE (DELTASONE) 5 MG tablet Take 3 tablets once in the morning on Day 1, then 2 tablets once in the morning on Day 2, then 1 tablet in the morning on day 3. Patient not taking: Reported on 12/16/2022 02/11/22   Dwyane Dee, MD  amiodarone (  PACERONE) 200 MG tablet Take 1 tablet (200 mg total) by mouth 2 (two) times daily. 02/11/22 03/15/22  Dwyane Dee, MD       Corrin Parker, M.D.  [facility]  Medical Director [place] Director [facility] Cardio-Pulmonary Department

## 2022-12-17 NOTE — Progress Notes (Signed)
  Progress Note   Patient: Kenneth Benson HER:[id number] DOB: 1961/05/08 DOA: 12/16/2022     1 DOS: the patient was seen and examined on 12/17/2022   Brief hospital course: 62 year old male with a known history of COPD, asthma, ongoing tobacco abuse, A-fib is admitted for rapid A-fib/flutter and acute hypoxic respiratory failure requiring BiPAP  1/19: cardiology, intensivist consult 1/20: On oral Cardizem and metoprolol for rate control.  Off BiPAP, waiting for PCU bed  Assessment and Plan: * Atrial fibrillation with RVR ([place]) He was first diagnosed with A-fib in February 2023 after a COPD flareup.  He had follow-up with Dr. Donnelly Angelica at [facility] in April 2023 -He had lost follow-up since then. His heart rate here was 120-130 and has been weaned off Cardizem drip and started on oral metoprolol and Cardizem for rate control Continue Eliquis 5 mg p.o. twice daily -2D echo pending Cardio following   Acute hypoxic respiratory failure (HCC) Likely due to COPD exacerbation and some fluid overload, received bolus of fluid in the ED. initially required BiPAP so was admitted for stepdown but later weaned off BiPAP to high flow nasal cannula so changed to PCU.  PCCM following Chest x-ray Cardiomegaly with pulmonary interstitial edema and likely trace effusions. Findings may reflect CHF.   COPD exacerbation (HCC) Continue IV Solu-Medrol Weaned off BiPAP to high flow nasal cannula Continue inhalers and nebulizers COVID and influenza negative PCCM following  Overweight (BMI 25.0-29.9) Complicates overall prognosis  Hyperkalemia K 5.9 likely due to AKI 1 dose of Lokelma given by nephrology Will recheck potassium  AKI (acute kidney injury) ([place]) Likely multifactorial from possible ATN due to hypotension, contrast-induced nephropathy, overdiuresis with Lasix .  Hold Lasix for now.  Nephrology consult  Acute on chronic diastolic CHF (congestive heart failure) (HCC) Chest x-ray showing  cardiomegaly with pulmonary interstitial edema and likely trace effusions. Findings may reflect CHF.  Received Lasix yesterday but now Holding Lasix as his kidney function worsened Strict I's and O's Daily weight Pending 2D echo Cardiology following Waiting for PCU bed        Subjective: Continues to feel short of breath, just got off the BiPAP as he wanted to eat breakfast  Physical Exam: Vitals:   12/17/22 1200 12/17/22 1300 12/17/22 1330 12/17/22 1345  BP: 115/83 102/81    Pulse: 96 62 (!) 122 (!) 130  Resp: (!) 24 (!) 25    Temp:      TempSrc:      SpO2: 21%      62 year old disheveled looking man laying in the bed in mild acute respiratory distress Lungs decreased breath sounds at the bases, bilateral expiratory wheezing.  No rales or rhonchi Cardiovascular irregularly irregular heart rate, tachycardic Abdomen soft, obese, benign Neuro alert and awake, nonfocal Psych normal mood and affect Skin no obvious rash or lesion Extremities 2 + Pedal edema Data Reviewed:  K5.9, creatinine 1.56  Family Communication: None  Disposition: Status is: Inpatient Remains inpatient appropriate because: He is critically sick and high risk for cardiorespiratory failure, multiorgan failure and death  Planned Discharge Destination: Home with Home Health   DVT prophylaxis-Eliquis Time spent (critical care): 35 minutes  Author: Max Sane, MD 12/17/2022 2:53 PM  For on call review [email or web address].

## 2022-12-17 NOTE — Consult Note (Signed)
[facility] Cardiology  CARDIOLOGY CONSULT NOTE  Patient ID: JOSEMIGUEL GRIES MRN: [id number] DOB/AGE: [birth year] 62 y.o.  Admit date: 12/16/2022 Referring Physician Manuella Ghazi Primary Physician none Primary Cardiologist Corky Sox Reason for Consultation atrial fibrillation  HPI: 61 year old gentleman referred for evaluation of atrial fibrillation with rapid ventricular rate.  Patient has a history of atrial fibrillation 01/21/2022 at which time he was admitted with COPD exacerbation, discharged home on Eliquis and metoprolol tartrate.  Presents to [facility] emergency room on 12/16/2022 with chief complaint of right-sided abdominal pain.  There was concern for mesenteric ischemia however CT was unremarkable with exception of large bilateral inguinal hernia.  The patient was noted to be in atrial fibrillation with rapid ventricular rate at 153 bpm.  Patient was treated with Eliquis, metoprolol tartrate 25 mg twice daily, and Cardizem bolus and infusion.  The patient remains in atrial fibrillation with controlled rate at 106 bpm.  He denies chest pain, shortness of breath, palpitations or heart racing.  Review of systems complete and found to be negative unless listed above     Past Medical History:  Diagnosis Date   Asthma    COPD (chronic obstructive pulmonary disease) ([place])    Tobacco abuse     History reviewed. No pertinent surgical history.  (Not in a hospital admission)  Social History   Socioeconomic History   Marital status: Single    Spouse name: Not on file   Number of children: Not on file   Years of education: Not on file   Highest education level: Not on file  Occupational History   Not on file  Tobacco Use   Smoking status: Every Day   Smokeless tobacco: Not on file  Vaping Use   Vaping Use: Never used  Substance and Sexual Activity   Alcohol use: No   Drug use: No   Sexual activity: Not on file  Other Topics Concern   Not on file  Social History Narrative   Not on file   Social  Determinants of Health   Financial Resource Strain: Not on file  Food Insecurity: Not on file  Transportation Needs: Not on file  Physical Activity: Not on file  Stress: Not on file  Social Connections: Not on file  Intimate Partner Violence: Not on file    History reviewed. No pertinent family history.    Review of systems complete and found to be negative unless listed above      PHYSICAL EXAM  General: Well developed, well nourished, in no acute distress HEENT:  Normocephalic and atramatic Neck:  No JVD.  Lungs: Clear bilaterally to auscultation and percussion. Heart: HRRR . Normal S1 and S2 without gallops or murmurs.  Abdomen: Bowel sounds are positive, abdomen soft and non-tender  Msk:  Back normal, normal gait. Normal strength and tone for age. Extremities: No clubbing, cyanosis or edema.   Neuro: Alert and oriented X 3. Psych:  Good affect, responds appropriately  Labs:   Lab Results  Component Value Date   WBC 10.3 12/17/2022   HGB 14.1 12/17/2022   HCT 47.5 12/17/2022   MCV 99.0 12/17/2022   PLT 199 12/17/2022    Recent Labs  Lab 12/16/22 1305 12/17/22 0615  NA 142 136  K 3.9 5.9*  CL 105 102  CO2 29 28  BUN 21* 30*  CREATININE 0.91 1.56*  CALCIUM 8.3* 7.7*  PROT 7.0  --   BILITOT 1.3*  --   ALKPHOS 44  --   ALT 29  --  AST 22  --   GLUCOSE 111* 189*   No results found for: "CKTOTAL", "CKMB", "CKMBINDEX", "TROPONINI"  Lab Results  Component Value Date   CHOL 150 12/17/2022   Lab Results  Component Value Date   HDL 66 12/17/2022   Lab Results  Component Value Date   LDLCALC 79 12/17/2022   Lab Results  Component Value Date   TRIG 24 12/17/2022   TRIG 110 02/03/2022   TRIG 2,338 (H) 02/03/2022   Lab Results  Component Value Date   CHOLHDL 2.3 12/17/2022   No results found for: "LDLDIRECT"    Radiology: [facility] Chest Port 1 View  Result Date: 12/17/2022 CLINICAL DATA:  Respiratory failure EXAM: PORTABLE CHEST 1 VIEW COMPARISON:   Yesterday FINDINGS: Cardiomegaly and vascular pedicle widening. Central vascular congestion and diffuse interstitial opacity. Trace pleural fluid is possible. No pneumothorax. IMPRESSION: CHF. Electronically Signed   By: Tiburcio Pea M.D.   On: 12/17/2022 08:39   CT Angio Abd/Pel W and/or Wo Contrast  Result Date: 12/16/2022 CLINICAL DATA:  Mesenteric ischemia. EXAM: CTA ABDOMEN AND PELVIS WITHOUT AND WITH CONTRAST TECHNIQUE: Multidetector CT imaging of the abdomen and pelvis was performed using the standard protocol during bolus administration of intravenous contrast. Multiplanar reconstructed images and MIPs were obtained and reviewed to evaluate the vascular anatomy. RADIATION DOSE REDUCTION: This exam was performed according to the departmental dose-optimization program which includes automated exposure control, adjustment of the mA and/or kV according to patient size and/or use of iterative reconstruction technique. CONTRAST:  OMNIPAQUE IOHEXOL 350 MG/ML SOLN COMPARISON:  Abdominal x-ray 02/18/2022 FINDINGS: VASCULAR Aorta: Overall normal course and caliber of the abdominal aorta with minimal atherosclerotic disease. No dissection or aneurysm formation. Celiac: Standard branching pattern to the celiac axis. No ostial stenosis. Tortuous course of the splenic artery. SMA: Patent without evidence of aneurysm, dissection, vasculitis or significant stenosis. Renals: 2 left and single right main renal arteries. No significant stenosis. IMA: Patent. Inflow: No significant stenosis.  Minimal atherosclerotic disease. Proximal Outflow: Grossly preserved.  No significant stenosis. Veins: Somewhat distended IVC and central iliac vessels. Timing of the contrast is arterial. Review of the MIP images confirms the above findings. NON-VASCULAR Lower chest: Heart is enlarged. Small pericardial effusion. Tiny pleural effusions. There patchy lung base opacities with breathing motion. Possible enlarged node seen in the  lower posterior mediastinum measured on series 6, image 1 measuring 2.2 x 1.6 cm. On prior chest CT of 01/30/2022 this node would have measured 17 x 12 mm. Hepatobiliary: Fatty liver infiltration. No obvious space-occupying liver lesion with the limits of the early phase of the arterial bolus. The gallbladder is contracted with stones. Patent portal vein. Pancreas: Mild global pancreatic atrophy without obvious mass. Spleen: Spleen is nonenlarged. Adrenals/Urinary Tract: Adrenal glands are preserved. No enhancing renal mass. Right kidney is slightly malrotated. There is a Bosniak 1 cyst extending inferior from the mid right kidney measuring diameter approaching 4.4 cm with Hounsfield unit of 8. No aggressive features. Bosniak 1 lesion. No specific imaging follow-up recommended. No collecting system dilatation. The ureters have normal course and caliber down to the bladder. Preserved contours of the urinary bladder. Stomach/Bowel: On this non oral contrast exam large bowel has a normal course and caliber with scattered stool. The cecum resides in the central pelvis. Normal retrocecal appendix. The stomach has some luminal debris but is nondilated. Small bowel is nondilated. No free air. Lymphatic: Few small less than 1 cm in size in short axis nodes are  seen along the retroperitoneum and mesentery, not pathologic by size criteria. Reproductive: Prominent prostate. Other: Large bilateral inguinal hernias. The right contains fat and fluid with stranding. The left similar but also contains the sigmoid colon. Due to the size of the hernia the caudal aspect of the bowel and hernias not included in the imaging field. What are seen of the colon is nondilated. Musculoskeletal: Patient is tilted in the scanner. Scattered degenerative changes of the spine. There is some mild compression of the superior endplate of L3 but the margins are sclerotic and favor a chronic process. IMPRESSION: VASCULAR Minimal atherosclerotic  disease. No significant stenosis or vessel occlusion of the central vessels of the abdomen or the aorta or iliac vessels. NON-VASCULAR Large bilateral inguinal hernias. The right containing fat, fluid and stranding. The left containing the sigmoid colon. This is a wide mouth hernia with no signs of obstruction Note the entirety of the sigmoid colon is not completely included in the imaging field due to the size of the hernia and extension caudal. Mild ascites with anasarca. Trace pleural fluid. The heart is enlarged. Slightly enlarged lower posterior right-sided mediastinal lymph node at the edge of the imaging field compared to an older study. Recommend dedicated chest workup when appropriate. Lung base parenchymal opacities. Electronically Signed   By: Jill Side M.D.   On: 12/16/2022 17:11   DG Chest 1 View  Result Date: 12/16/2022 CLINICAL DATA:  Dyspnea EXAM: CHEST  1 VIEW COMPARISON:  Chest radiograph 05/23/2022 FINDINGS: The heart is enlarged. The upper mediastinal contours are within normal limits There is prominence of the pulmonary vasculature with diffuse increased interstitial markings likely reflecting pulmonary interstitial edema. There are suspected small bilateral pleural effusions. There is no pneumothorax There is no acute osseous abnormality. IMPRESSION: Cardiomegaly with pulmonary interstitial edema and likely trace effusions. Findings may reflect CHF. Electronically Signed   By: Valetta Mole M.D.   On: 12/16/2022 12:56    EKG: Atrial fibrillation at 124 bpm  ASSESSMENT AND PLAN:   1.  Atrial fibrillation with rapid ventricular rate, started on Eliquis for stroke prevention, and metoprolol to tartrate and Cardizem bolus and infusion for rate and rhythm control.  Remains in atrial fibrillation with controlled rate of 106 bpm.  Patient currently asymptomatic. 2.  Acute hypoxic respiratory failure, secondary to COPD exacerbation 3.  Chronic diastolic congestive heart failure, chest  x-ray revealing pulmonary interstitial edema, 2D echocardiogram 01/21/2022 revealed LVEF 60-65%  Recommendations  1.  Agree with current therapy 2.  Continue Eliquis for stroke prevention 3.  Transition Cardizem IV to diltiazem 60 mg p.o. every 6 hours 4.  Continue metoprolol tartrate 25 mg twice daily 5.  Reviewed 2D echocardiogram  Signed: Isaias Cowman MD,PhD, [facility] 12/17/2022, 10:36 AM

## 2022-12-17 NOTE — Consult Note (Signed)
[facility]  CONSULT NOTE    Date: 12/17/2022                  Patient Name:  Kenneth Benson  MRN: [id number]  DOB: 06-25-1961  Age / Sex: 62 y.o., male         PCP: Pcp, No                 Service Requesting Consult: TRH                 Reason for Consult: AKI             History of Present Illness: Kenneth Benson is a 62 y.o.  male with , who was admitted to [facility] on 12/16/2022 for Atrial flutter (HCC) [I48.92] . Patient has a history of asthma, COPD, tobacco abuse who originally presented for right sided abdominal pain was found to be in rapid A-fib/flutter.   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 Patient received CT with contrast for abdominal pain workup. Mesenteric ischemia was a concern but eventually ruled out. Patient became hypoxic requiring BiPAP. Eventually given IV lasix 40 mg twice a day.    Patient creatinine was originally 0.91 but increased to 1.56. Potassium also increased from 3.9 to 5.9.   Patient reports that he has had decent urine output but that his urine color has become darker.    Medications: Outpatient medications: (Not in a hospital admission)   Current medications: Current Facility-Administered Medications  Medication Dose Route Frequency Provider Last Rate Last Admin   acetaminophen (TYLENOL) tablet 650 mg  650 mg Oral Q4H PRN Delfino Lovett, MD       apixaban (ELIQUIS) tablet 5 mg  5 mg Oral BID Delfino Lovett, MD   5 mg at 12/17/22 0953   budesonide (PULMICORT) nebulizer solution 0.5 mg  0.5 mg Nebulization BID Erin Fulling, MD   0.5 mg at 12/17/22 0841   diltiazem (CARDIZEM) tablet 60 mg  60 mg Oral Q6H Paraschos, Alexander, MD   60 mg at 12/17/22 1108   ipratropium-albuterol (DUONEB) 0.5-2.5 (3) MG/3ML nebulizer solution 3 mL  3 mL Nebulization Q6H PRN Delfino Lovett, MD       ipratropium-albuterol (DUONEB) 0.5-2.5 (3)  MG/3ML nebulizer solution 3 mL  3 mL Nebulization Q4H Erin Fulling, MD   3 mL at 12/17/22 1108   methylPREDNISolone sodium succinate (SOLU-MEDROL) 40 mg/mL injection 40 mg  40 mg Intravenous Q12H Delfino Lovett, MD   40 mg at 12/16/22 2350   metoprolol tartrate (LOPRESSOR) tablet 25 mg  25 mg Oral BID Delfino Lovett, MD       ondansetron (ZOFRAN) injection 4 mg  4 mg Intravenous Q6H PRN Delfino Lovett, MD       sodium zirconium cyclosilicate (LOKELMA) packet 10 g  10 g Oral Daily Mosetta Pigeon, MD       Current Outpatient Medications  Medication Sig Dispense Refill   albuterol (PROVENTIL HFA) 108 (90 Base) MCG/ACT inhaler Inhale 2 puffs into the lungs every 6 (six) hours as needed for wheezing. 6.7 g 3   apixaban (ELIQUIS) 5 MG TABS tablet Take 1 tablet (5 mg total) by mouth 2 (two) times daily. 60 tablet 3   metoprolol tartrate (LOPRESSOR) 25 MG tablet Take 1 tablet (25 mg total) by mouth 2 (two) times daily. (Patient not taking: Reported on 12/16/2022)  60 tablet 3   predniSONE (DELTASONE) 5 MG tablet Take 3 tablets once in the morning on Day 1, then 2 tablets once in the morning on Day 2, then 1 tablet in the morning on day 3. (Patient not taking: Reported on 12/16/2022) 6 tablet 0      Allergies: No Known Allergies    Past Medical History: Past Medical History:  Diagnosis Date   Asthma    COPD (chronic obstructive pulmonary disease) (HCC)    Tobacco abuse      Past Surgical History: History reviewed. No pertinent surgical history.   Family History: History reviewed. No pertinent family history.   Social History: Social History   Socioeconomic History   Marital status: Single    Spouse name: Not on file   Number of children: Not on file   Years of education: Not on file   Highest education level: Not on file  Occupational History   Not on file  Tobacco Use   Smoking status: Every Day   Smokeless tobacco: Not on file  Vaping Use   Vaping Use: Never used  Substance and  Sexual Activity   Alcohol use: No   Drug use: No   Sexual activity: Not on file  Other Topics Concern   Not on file  Social History Narrative   Not on file   Social Determinants of Health   Financial Resource Strain: Not on file  Food Insecurity: Not on file  Transportation Needs: Not on file  Physical Activity: Not on file  Stress: Not on file  Social Connections: Not on file  Intimate Partner Violence: Not on file     Review of Systems: Review of Systems  Respiratory:  Positive for wheezing.   Cardiovascular:  Positive for leg swelling.  All other systems reviewed and are negative.   Vital Signs: Blood pressure 105/76, pulse 96, temperature 98.6 F (37 C), resp. rate (!) 24, SpO2 92 %.  Weight trends: There were no vitals filed for this visit.  Physical Exam: General: NAD,   Head: Normocephalic, atraumatic. Moist oral mucosal membranes  Eyes: Anicteric, PERRL  Neck: Supple, trachea midline  Lungs:  +wheezing throughout, no crackles on exam  . On [place]   Heart: Irregular rate, tachycardic   Abdomen:  Soft, nontender,   Extremities:  Trace peripheral edema.  Neurologic: Nonfocal, moving all four extremities  Skin: No lesions        Lab results: Basic Metabolic Panel: Recent Labs  Lab 12/16/22 1305 12/17/22 0615  NA 142 136  K 3.9 5.9*  CL 105 102  CO2 29 28  GLUCOSE 111* 189*  BUN 21* 30*  CREATININE 0.91 1.56*  CALCIUM 8.3* 7.7*  MG  --  2.2    Liver Function Tests: Recent Labs  Lab 12/16/22 1305  AST 22  ALT 29  ALKPHOS 44  BILITOT 1.3*  PROT 7.0  ALBUMIN 3.8   Recent Labs  Lab 12/16/22 1305  LIPASE 30   No results for input(s): "AMMONIA" in the last 168 hours.  CBC: Recent Labs  Lab 12/16/22 1305 12/17/22 0615  WBC 8.4 10.3  NEUTROABS 6.8  --   HGB 13.7 14.1  HCT 44.2 47.5  MCV 95.1 99.0  PLT 178 199    Cardiac Enzymes: No results for input(s): "CKTOTAL", "CKMB", "CKMBINDEX", "TROPONINI" in the last 168  hours.  BNP: Invalid input(s): "POCBNP"  CBG: No results for input(s): "GLUCAP" in the last 168 hours.  Microbiology: Results for orders placed  or performed during the hospital encounter of 12/16/22  Resp panel by RT-PCR (RSV, Flu A&B, Covid) Anterior Nasal Swab     Status: None   Collection Time: 12/16/22  1:18 PM   Specimen: Anterior Nasal Swab  Result Value Ref Range Status   SARS Coronavirus 2 by RT PCR NEGATIVE NEGATIVE Final    Comment: (NOTE) SARS-CoV-2 target nucleic acids are NOT DETECTED.  The SARS-CoV-2 RNA is generally detectable in upper respiratory specimens during the acute phase of infection. The lowest concentration of SARS-CoV-2 viral copies this assay can detect is 138 copies/mL. A negative result does not preclude SARS-Cov-2 infection and should not be used as the sole basis for treatment or other patient management decisions. A negative result may occur with  improper specimen collection/handling, submission of specimen other than nasopharyngeal swab, presence of viral mutation(s) within the areas targeted by this assay, and inadequate number of viral copies(<138 copies/mL). A negative result must be combined with clinical observations, patient history, and epidemiological information. The expected result is Negative.  Fact Sheet for Patients:  BloggerCourse.com  Fact Sheet for Healthcare Providers:  SeriousBroker.it  This test is no t yet approved or cleared by the [place] FDA and  has been authorized for detection and/or diagnosis of SARS-CoV-2 by FDA under an Emergency Use Authorization (EUA). This EUA will remain  in effect (meaning this test can be used) for the duration of the COVID-19 declaration under Section 564(b)(1) of the Act, 21 U.S.C.section 360bbb-3(b)(1), unless the authorization is terminated  or revoked sooner.       Influenza A by PCR NEGATIVE NEGATIVE Final   Influenza B by  PCR NEGATIVE NEGATIVE Final    Comment: (NOTE) The Xpert Xpress SARS-CoV-2/FLU/RSV plus assay is intended as an aid in the diagnosis of influenza from Nasopharyngeal swab specimens and should not be used as a sole basis for treatment. Nasal washings and aspirates are unacceptable for Xpert Xpress SARS-CoV-2/FLU/RSV testing.  Fact Sheet for Patients: BloggerCourse.com  Fact Sheet for Healthcare Providers: SeriousBroker.it  This test is not yet approved or cleared by the [place] FDA and has been authorized for detection and/or diagnosis of SARS-CoV-2 by FDA under an Emergency Use Authorization (EUA). This EUA will remain in effect (meaning this test can be used) for the duration of the COVID-19 declaration under Section 564(b)(1) of the Act, 21 U.S.C. section 360bbb-3(b)(1), unless the authorization is terminated or revoked.     Resp Syncytial Virus by PCR NEGATIVE NEGATIVE Final    Comment: (NOTE) Fact Sheet for Patients: BloggerCourse.com  Fact Sheet for Healthcare Providers: SeriousBroker.it  This test is not yet approved or cleared by the [place] FDA and has been authorized for detection and/or diagnosis of SARS-CoV-2 by FDA under an Emergency Use Authorization (EUA). This EUA will remain in effect (meaning this test can be used) for the duration of the COVID-19 declaration under Section 564(b)(1) of the Act, 21 U.S.C. section 360bbb-3(b)(1), unless the authorization is terminated or revoked.  Performed at [facility], [address] [place], [place] [address]     Coagulation Studies: No results for input(s): "LABPROT", "INR" in the last 72 hours.  Urinalysis: Recent Labs    12/16/22 1305  COLORURINE YELLOW*  LABSPEC 1.026  PHURINE 7.0  GLUCOSEU NEGATIVE  HGBUR NEGATIVE  BILIRUBINUR NEGATIVE  KETONESUR NEGATIVE  PROTEINUR 30*  NITRITE  NEGATIVE  LEUKOCYTESUR NEGATIVE      Imaging: DG Chest Port 1 View  Result Date: 12/17/2022 CLINICAL DATA:  Respiratory failure EXAM: PORTABLE CHEST 1 VIEW COMPARISON:  Yesterday FINDINGS: Cardiomegaly and vascular pedicle widening. Central vascular congestion and diffuse interstitial opacity. Trace pleural fluid is possible. No pneumothorax. IMPRESSION: CHF. Electronically Signed   By: Jorje Guild M.D.   On: 12/17/2022 08:39   CT Angio Abd/Pel W and/or Wo Contrast  Result Date: 12/16/2022 CLINICAL DATA:  Mesenteric ischemia. EXAM: CTA ABDOMEN AND PELVIS WITHOUT AND WITH CONTRAST TECHNIQUE: Multidetector CT imaging of the abdomen and pelvis was performed using the standard protocol during bolus administration of intravenous contrast. Multiplanar reconstructed images and MIPs were obtained and reviewed to evaluate the vascular anatomy. RADIATION DOSE REDUCTION: This exam was performed according to the departmental dose-optimization program which includes automated exposure control, adjustment of the mA and/or kV according to patient size and/or use of iterative reconstruction technique. CONTRAST:  176mL OMNIPAQUE IOHEXOL 350 MG/ML SOLN COMPARISON:  Abdominal x-ray 02/18/2022 FINDINGS: VASCULAR Aorta: Overall normal course and caliber of the abdominal aorta with minimal atherosclerotic disease. No dissection or aneurysm formation. Celiac: Standard branching pattern to the celiac axis. No ostial stenosis. Tortuous course of the splenic artery. SMA: Patent without evidence of aneurysm, dissection, vasculitis or significant stenosis. Renals: 2 left and single right main renal arteries. No significant stenosis. IMA: Patent. Inflow: No significant stenosis.  Minimal atherosclerotic disease. Proximal Outflow: Grossly preserved.  No significant stenosis. Veins: Somewhat distended IVC and central iliac vessels. Timing of the contrast is arterial. Review of the MIP images confirms the above findings.  NON-VASCULAR Lower chest: Heart is enlarged. Small pericardial effusion. Tiny pleural effusions. There patchy lung base opacities with breathing motion. Possible enlarged node seen in the lower posterior mediastinum measured on series 6, image 1 measuring 2.2 x 1.6 cm. On prior chest CT of 01/30/2022 this node would have measured 17 x 12 mm. Hepatobiliary: Fatty liver infiltration. No obvious space-occupying liver lesion with the limits of the early phase of the arterial bolus. The gallbladder is contracted with stones. Patent portal vein. Pancreas: Mild global pancreatic atrophy without obvious mass. Spleen: Spleen is nonenlarged. Adrenals/Urinary Tract: Adrenal glands are preserved. No enhancing renal mass. Right kidney is slightly malrotated. There is a Bosniak 1 cyst extending inferior from the mid right kidney measuring diameter approaching 4.4 cm with Hounsfield unit of 8. No aggressive features. Bosniak 1 lesion. No specific imaging follow-up recommended. No collecting system dilatation. The ureters have normal course and caliber down to the bladder. Preserved contours of the urinary bladder. Stomach/Bowel: On this non oral contrast exam large bowel has a normal course and caliber with scattered stool. The cecum resides in the central pelvis. Normal retrocecal appendix. The stomach has some luminal debris but is nondilated. Small bowel is nondilated. No free air. Lymphatic: Few small less than 1 cm in size in short axis nodes are seen along the retroperitoneum and mesentery, not pathologic by size criteria. Reproductive: Prominent prostate. Other: Large bilateral inguinal hernias. The right contains fat and fluid with stranding. The left similar but also contains the sigmoid colon. Due to the size of the hernia the caudal aspect of the bowel and hernias not included in the imaging field. What are seen of the colon is nondilated. Musculoskeletal: Patient is tilted in the scanner. Scattered degenerative changes  of the spine. There is some mild compression of the superior endplate of L3 but the margins are sclerotic and favor a chronic process. IMPRESSION: VASCULAR Minimal atherosclerotic disease. No significant stenosis or vessel occlusion of the central vessels of the abdomen  or the aorta or iliac vessels. NON-VASCULAR Large bilateral inguinal hernias. The right containing fat, fluid and stranding. The left containing the sigmoid colon. This is a wide mouth hernia with no signs of obstruction Note the entirety of the sigmoid colon is not completely included in the imaging field due to the size of the hernia and extension caudal. Mild ascites with anasarca. Trace pleural fluid. The heart is enlarged. Slightly enlarged lower posterior right-sided mediastinal lymph node at the edge of the imaging field compared to an older study. Recommend dedicated chest workup when appropriate. Lung base parenchymal opacities. Electronically Signed   By: Jill Side M.D.   On: 12/16/2022 17:11   DG Chest 1 View  Result Date: 12/16/2022 CLINICAL DATA:  Dyspnea EXAM: CHEST  1 VIEW COMPARISON:  Chest radiograph 05/23/2022 FINDINGS: The heart is enlarged. The upper mediastinal contours are within normal limits There is prominence of the pulmonary vasculature with diffuse increased interstitial markings likely reflecting pulmonary interstitial edema. There are suspected small bilateral pleural effusions. There is no pneumothorax There is no acute osseous abnormality. IMPRESSION: Cardiomegaly with pulmonary interstitial edema and likely trace effusions. Findings may reflect CHF. Electronically Signed   By: Valetta Mole M.D.   On: 12/16/2022 12:56     Assessment & Plan: Kenneth Benson is a 62 y.o.  male with , who was admitted to [facility] on 12/16/2022 for Atrial flutter ([place]) [I48.92] . Patient has a history of asthma, COPD, tobacco abuse who originally presented for right sided abdominal pain was found to be in rapid A-fib/flutter.     Acute Kidney Injury      - likely secondary to IV contrast exposure. May be a component of over diuresis as well.     - continue to monitor serum creatinine     - agree to hold IV lasix, lower extremity edema improved, no crackles on exam. Appears Euvolemic        2. Hyperkalemia    - patient serum K increased to 5.9 , initially 3.9 on admission    - give lokelma 10g orally once a day.      LOS: [place] 1/20/202411:28 AM

## 2022-12-17 NOTE — ED Notes (Signed)
Patient denies pain and is resting comfortably.  

## 2022-12-17 NOTE — Assessment & Plan Note (Signed)
K 5.9 likely due to AKI 1 dose of Lokelma given by nephrology Will recheck potassium

## 2022-12-18 ENCOUNTER — Inpatient Hospital Stay
Admit: 2022-12-18 | Discharge: 2022-12-18 | Disposition: A | Discharge status: Home / Self Care | Payer: Medicaid Other | Attending: Internal Medicine | Admitting: Internal Medicine

## 2022-12-18 ENCOUNTER — Encounter: Payer: Self-pay | Admitting: Internal Medicine

## 2022-12-18 DIAGNOSIS — N179 Acute kidney failure, unspecified: Secondary | ICD-10-CM | POA: Diagnosis not present

## 2022-12-18 DIAGNOSIS — I5033 Acute on chronic diastolic (congestive) heart failure: Secondary | ICD-10-CM | POA: Diagnosis not present

## 2022-12-18 DIAGNOSIS — E875 Hyperkalemia: Secondary | ICD-10-CM | POA: Diagnosis not present

## 2022-12-18 DIAGNOSIS — I4891 Unspecified atrial fibrillation: Secondary | ICD-10-CM | POA: Diagnosis not present

## 2022-12-18 LAB — BASIC METABOLIC PANEL
Anion gap: 8 (ref 5–15)
Anion gap: 7 (ref 5–15)
BUN: 47 mg/dL — ABNORMAL HIGH (ref 6–20)
BUN: 48 mg/dL — ABNORMAL HIGH (ref 6–20)
CO2: 27 mmol/L (ref 22–32)
CO2: 26 mmol/L (ref 22–32)
Calcium: 7.7 mg/dL — ABNORMAL LOW (ref 8.9–10.3)
Calcium: 7.5 mg/dL — ABNORMAL LOW (ref 8.9–10.3)
Chloride: 97 mmol/L — ABNORMAL LOW (ref 98–111)
Chloride: 98 mmol/L (ref 98–111)
Creatinine, Ser: 1.83 mg/dL — ABNORMAL HIGH (ref 0.61–1.24)
Creatinine, Ser: 1.82 mg/dL — ABNORMAL HIGH (ref 0.61–1.24)
GFR, Estimated: 42 mL/min — ABNORMAL LOW (ref >60)
GFR, Estimated: 42 mL/min — ABNORMAL LOW (ref >60)
Glucose, Bld: 154 mg/dL — ABNORMAL HIGH (ref 70–99)
Glucose, Bld: 146 mg/dL — ABNORMAL HIGH (ref 70–99)
Potassium: 6 mmol/L — ABNORMAL HIGH (ref 3.5–5.1)
Potassium: 5.8 mmol/L — ABNORMAL HIGH (ref 3.5–5.1)
Sodium: 132 mmol/L — ABNORMAL LOW (ref 135–145)
Sodium: 131 mmol/L — ABNORMAL LOW (ref 135–145)

## 2022-12-18 LAB — CBC
HCT: 45.6 % (ref 39.0–52.0)
Hemoglobin: 13.8 g/dL (ref 13.0–17.0)
MCH: 29.4 pg (ref 26.0–34.0)
MCHC: 30.3 g/dL (ref 30.0–36.0)
MCV: 97.2 fL (ref 80.0–100.0)
Platelets: 189 10*3/uL (ref 150–400)
RBC: 4.69 MIL/uL (ref 4.22–5.81)
RDW: 16.4 % — ABNORMAL HIGH (ref 11.5–15.5)
WBC: 10.9 10*3/uL — ABNORMAL HIGH (ref 4.0–10.5)
nRBC: 0 % (ref 0.0–0.2)

## 2022-12-18 LAB — GLUCOSE, RANDOM: Glucose, Bld: 114 mg/dL — ABNORMAL HIGH (ref 70–99)

## 2022-12-18 LAB — MRSA NEXT GEN BY PCR, NASAL: MRSA by PCR Next Gen: NOT DETECTED

## 2022-12-18 LAB — POTASSIUM
Potassium: 5.7 mmol/L — ABNORMAL HIGH (ref 3.5–5.1)
Potassium: 5.5 mmol/L — ABNORMAL HIGH (ref 3.5–5.1)

## 2022-12-18 MED ORDER — SODIUM ZIRCONIUM CYCLOSILICATE 10 G PO PACK
10.0000 g | PACK | Freq: Once | ORAL | Status: AC
  Filled 2022-12-18: qty 1

## 2022-12-18 MED ORDER — DEXTROSE 10 % IV SOLN: Freq: Once | INTRAVENOUS | Status: AC

## 2022-12-18 MED ORDER — IPRATROPIUM-ALBUTEROL 0.5-2.5 (3) MG/3ML IN SOLN
3.0000 mL | Freq: Three times a day (TID) | RESPIRATORY_TRACT | Status: DC
  Administered 2022-12-18 – 2022-12-19 (×3): 3 mL via RESPIRATORY_TRACT
  Filled 2022-12-18 (×3): qty 3

## 2022-12-18 MED ORDER — DEXTROSE 50 % IV SOLN
1.0000 | Freq: Once | INTRAVENOUS | Status: AC
  Administered 2022-12-18: 50 mL via INTRAVENOUS
  Filled 2022-12-18: qty 50

## 2022-12-18 MED ORDER — SODIUM CHLORIDE 0.9 % IV BOLUS
500.0000 mL | Freq: Once | INTRAVENOUS | Status: AC
  Administered 2022-12-18: 500 mL via INTRAVENOUS

## 2022-12-18 MED ORDER — CHLORHEXIDINE GLUCONATE CLOTH 2 % EX PADS
6.0000 | MEDICATED_PAD | Freq: Every day | CUTANEOUS | Status: DC
  Administered 2022-12-18 – 2022-12-23 (×2): 6 via TOPICAL

## 2022-12-18 MED ORDER — INSULIN ASPART 100 UNIT/ML IV SOLN
5.0000 [IU] | Freq: Once | INTRAVENOUS | Status: AC
  Administered 2022-12-18: 5 [IU] via INTRAVENOUS
  Filled 2022-12-18: qty 0.05

## 2022-12-18 MED ORDER — ALBUTEROL SULFATE (2.5 MG/3ML) 0.083% IN NEBU
10.0000 mg | INHALATION_SOLUTION | Freq: Once | RESPIRATORY_TRACT | Status: AC
  Administered 2022-12-18: 10 mg via RESPIRATORY_TRACT
  Filled 2022-12-18: qty 12

## 2022-12-18 MED ORDER — SODIUM ZIRCONIUM CYCLOSILICATE 10 G PO PACK
10.0000 g | PACK | Freq: Two times a day (BID) | ORAL | Status: DC
  Administered 2022-12-18 – 2022-12-19 (×3): 10 g via ORAL
  Filled 2022-12-18: qty 2
  Filled 2022-12-18 (×2): qty 1
  Filled 2022-12-18: qty 2

## 2022-12-18 NOTE — Assessment & Plan Note (Signed)
Likely multifactorial from possible ATN due to hypotension, contrast-induced nephropathy, overdiuresis with Lasix .  Hold Lasix for now.  Nephrology following  Lab Results  Component Value Date   CREATININE 1.82 (H) 12/18/2022   CREATININE 1.83 (H) 12/18/2022   CREATININE 1.57 (H) 12/17/2022

## 2022-12-18 NOTE — Progress Notes (Addendum)
Progress Note   Patient: Kenneth Benson WUJ:[id number] DOB: [birth year] DOA: 12/16/2022     2 DOS: the patient was seen and examined on 12/18/2022   Brief hospital course: 62 year old male with a known history of COPD, asthma, ongoing tobacco abuse, A-fib is admitted for rapid A-fib/flutter and acute hypoxic respiratory failure requiring BiPAP  1/19: cardiology, intensivist consult 1/20: On oral Cardizem and metoprolol for rate control.  Off BiPAP 1/21: Nephrology consult for AKI and hyperkalemia, transferred to stepdown for close monitoring  Assessment and Plan: * Atrial fibrillation with RVR ([place]) He was first diagnosed with A-fib in February 2023 after a COPD flareup.  He had follow-up with Dr. Donnelly Angelica at [facility] in April 2023 -He had lost follow-up since then. Continue oral metoprolol and Cardizem for rate control Continue Eliquis 5 mg p.o. twice daily -2D echo pending Cardio following   Acute hypoxic respiratory failure (HCC) Likely due to COPD exacerbation and some fluid overload, received bolus of fluid in the ED. initially required BiPAP so was admitted for stepdown but later weaned off BiPAP to high flow nasal cannula -transfer to stepdown this morning.  PCCM following Chest x-ray Cardiomegaly with pulmonary interstitial edema and likely trace effusions. Findings may reflect CHF.   COPD exacerbation (HCC) Continue IV Solu-Medrol Weaned off BiPAP to high flow nasal cannula Continue inhalers and nebulizers COVID and influenza negative PCCM following, transfer to stepdown this morning for close monitoring  Overweight (BMI 25.0-29.9) Complicates overall prognosis.  Hyperkalemia Followed by nephrology.  On Lokelma twice daily Will check potassium every 4 hours for now till normal and monitor in stepdown  AKI (acute kidney injury) ([place]) Likely multifactorial from possible ATN due to hypotension, contrast-induced nephropathy, overdiuresis with Lasix .  Hold Lasix for  now.  Nephrology following  Lab Results  Component Value Date   CREATININE 1.82 (H) 12/18/2022   CREATININE 1.83 (H) 12/18/2022   CREATININE 1.57 (H) 12/17/2022     Acute on chronic diastolic CHF (congestive heart failure) (HCC) Chest x-ray showing cardiomegaly with pulmonary interstitial edema and likely trace effusions. Findings may reflect CHF.   Holding Lasix as his kidney function worsened Strict I's and O's Daily weight Pending 2D echo Cardiology following Net IO Since Admission: -744.33 mL [12/18/22 1633]           Subjective: Short of breath, was on BiPAP when I saw him  Physical Exam: Vitals:   12/18/22 1010 12/18/22 1128 12/18/22 1200 12/18/22 1430  BP: 130/84  113/83   Pulse: 69  (!) 59 (!) 102  Resp: (!) 25  (!) 24 (!) 28  Temp: 98 F (36.7 C)     TempSrc:      SpO2: (!) 89% 94% 94% 93%  Weight: 126.3 kg     Height:       62 year old disheveled looking man laying in the bed in mild acute respiratory distress Lungs decreased breath sounds at the bases, no wheezing, rales or rhonchi Cardiovascular irregularly irregular heart rate, tachycardic Abdomen soft, obese, benign Neuro alert and awake, nonfocal Psych normal mood and affect Skin no obvious rash or lesion Extremities:no Pedal edema Data Reviewed:  K5.7, creatinine 1.82  Family Communication: None  Disposition: Status is: Inpatient Remains inpatient appropriate because: Management of hyperkalemia, AKI, respiratory failure He remains critically sick with high risk for acute cardiorespiratory failure, multiorgan failure and death.  Will transfer him to stepdown due to hyperkalemia and his respiratory status for close monitoring   Planned Discharge Destination:  Home with Home Health   DVT prophylaxis-Eliquis Time spent (critical care): 35 minutes  Author: Max Sane, MD 12/18/2022 4:33 PM  For on call review [email or web address].

## 2022-12-18 NOTE — Assessment & Plan Note (Signed)
Continue IV Solu-Medrol Weaned off BiPAP to high flow nasal cannula Continue inhalers and nebulizers COVID and influenza negative PCCM following, transfer to stepdown this morning for close monitoring

## 2022-12-18 NOTE — Plan of Care (Signed)
Patient placed on bipap at 2100 and pt fell asleep, at 2210 patient woke up and pulled off bipap and stated he wanted it off, high flow nasal canula placed at 10L, patient had brief moment of confusion during this occurrence, kept repeating Valentine's day to the question of where are you? Patient now Alert and oriented x4, moves all extremities GCS 15. Provider notified via secure chat

## 2022-12-18 NOTE — Assessment & Plan Note (Signed)
Likely due to COPD exacerbation and some fluid overload, received bolus of fluid in the ED. initially required BiPAP so was admitted for stepdown but later weaned off BiPAP to high flow nasal cannula -transfer to stepdown this morning.  PCCM following Chest x-ray Cardiomegaly with pulmonary interstitial edema and likely trace effusions. Findings may reflect CHF.

## 2022-12-18 NOTE — Assessment & Plan Note (Signed)
He was first diagnosed with A-fib in February 2023 after a COPD flareup.  He had follow-up with Dr. Donnelly Angelica at [facility] in April 2023 -He had lost follow-up since then. Continue oral metoprolol and Cardizem for rate control Continue Eliquis 5 mg p.o. twice daily -2D echo pending Cardio following

## 2022-12-18 NOTE — Progress Notes (Signed)
[facility]  ROUNDING NOTE   Subjective:   [name] is a 62 y.o.  male with , who was admitted to [facility] on 12/16/2022 for Atrial flutter (HCC) [I48.92] . Patient has a history of asthma, COPD, tobacco abuse who originally presented for right sided abdominal pain was found to be in rapid A-fib/flutter.    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 Patient received CT with contrast for abdominal pain workup. Mesenteric ischemia was a concern but eventually ruled out. Patient became hypoxic requiring BiPAP. Eventually given IV lasix 40 mg twice a day.    Patient reports he continues to have some flank pain   States he always has lower leg swelling   Objective:  Vital signs in last 24 hours:  Temp:  [97.7 F (36.5 C)-98.4 F (36.9 C)] 98 F (36.7 C) (01/21 1010) Pulse Rate:  [34-145] 69 (01/21 1010) Resp:  [20-30] 25 (01/21 1010) BP: (93-133)/(60-95) 130/84 (01/21 1010) SpO2:  [88 %-96 %] 94 % (01/21 1128) FiO2 (%):  [60 %-70 %] 60 % (01/21 0833) Weight:  [99.8 kg-126.3 kg] 126.3 kg (01/21 1010)  Weight change:  Filed Weights   12/17/22 1746 12/18/22 1010  Weight: 99.8 kg 126.3 kg    Intake/Output: I/O last 3 completed shifts: In: 137 [I.V.:137] Out: 650 [Urine:650]   Intake/Output this shift:  Total I/O In: 518.7 [I.V.:15; IV Piggyback:503.7] Out: 100 [Urine:100]  Physical Exam: General: NAD,   Head: Normocephalic, atraumatic. Moist oral mucosal membranes  Eyes: Anicteric, PERRL  Neck: Supple, trachea midline  Lungs:  Clear to auscultation  Heart: Regular rate and rhythm  Abdomen:  Soft, nontender,   Extremities:  trace peripheral edema.  Neurologic: Nonfocal, moving all four extremities  Skin: No lesions  Access: None     Basic Metabolic Panel: Recent Labs  Lab 12/16/22 1305 12/17/22 0615 12/17/22 1510 12/18/22 0520 12/18/22 0844  NA 142  136 132* 132* 131*  K 3.9 5.9* 5.9* 6.0* 5.8*  CL 105 102 97* 97* 98  CO2 29 28 28 27 26   GLUCOSE 111* 189* 171* 154* 146*  BUN 21* 30* 34* 47* 48*  CREATININE 0.91 1.56* 1.57* 1.83* 1.82*  CALCIUM 8.3* 7.7* 7.4* 7.7* 7.5*  MG  --  2.2  --   --   --     Liver Function Tests: Recent Labs  Lab 12/16/22 1305  AST 22  ALT 29  ALKPHOS 44  BILITOT 1.3*  PROT 7.0  ALBUMIN 3.8   Recent Labs  Lab 12/16/22 1305  LIPASE 30   No results for input(s): "AMMONIA" in the last 168 hours.  CBC: Recent Labs  Lab 12/16/22 1305 12/17/22 0615 12/18/22 0520  WBC 8.4 10.3 10.9*  NEUTROABS 6.8  --   --   HGB 13.7 14.1 13.8  HCT 44.2 47.5 45.6  MCV 95.1 99.0 97.2  PLT 178 199 189    Cardiac Enzymes: No results for input(s): "CKTOTAL", "CKMB", "CKMBINDEX", "TROPONINI" in the last 168 hours.  BNP: Invalid input(s): "POCBNP"  CBG: No results for input(s): "GLUCAP" in the last 168 hours.  Microbiology: Results for orders placed or performed during the hospital encounter of 12/16/22  Resp panel by RT-PCR (RSV, Flu A&B, Covid) Anterior Nasal Swab     Status: None   Collection Time: 12/16/22  1:18 PM   Specimen: Anterior Nasal Swab  Result Value Ref  Range Status   SARS Coronavirus 2 by RT PCR NEGATIVE NEGATIVE Final    Comment: (NOTE) SARS-CoV-2 target nucleic acids are NOT DETECTED.  The SARS-CoV-2 RNA is generally detectable in upper respiratory specimens during the acute phase of infection. The lowest concentration of SARS-CoV-2 viral copies this assay can detect is 138 copies/mL. A negative result does not preclude SARS-Cov-2 infection and should not be used as the sole basis for treatment or other patient management decisions. A negative result may occur with  improper specimen collection/handling, submission of specimen other than nasopharyngeal swab, presence of viral mutation(s) within the areas targeted by this assay, and inadequate number of viral copies(<138  copies/mL). A negative result must be combined with clinical observations, patient history, and epidemiological information. The expected result is Negative.  Fact Sheet for Patients:  EntrepreneurPulse.com.au  Fact Sheet for Healthcare Providers:  IncredibleEmployment.be  This test is no t yet approved or cleared by the [place] FDA and  has been authorized for detection and/or diagnosis of SARS-CoV-2 by FDA under an Emergency Use Authorization (EUA). This EUA will remain  in effect (meaning this test can be used) for the duration of the COVID-19 declaration under Section 564(b)(1) of the Act, 21 U.S.C.section 360bbb-3(b)(1), unless the authorization is terminated  or revoked sooner.       Influenza A by PCR NEGATIVE NEGATIVE Final   Influenza B by PCR NEGATIVE NEGATIVE Final    Comment: (NOTE) The Xpert Xpress SARS-CoV-2/FLU/RSV plus assay is intended as an aid in the diagnosis of influenza from Nasopharyngeal swab specimens and should not be used as a sole basis for treatment. Nasal washings and aspirates are unacceptable for Xpert Xpress SARS-CoV-2/FLU/RSV testing.  Fact Sheet for Patients: EntrepreneurPulse.com.au  Fact Sheet for Healthcare Providers: IncredibleEmployment.be  This test is not yet approved or cleared by the [place] FDA and has been authorized for detection and/or diagnosis of SARS-CoV-2 by FDA under an Emergency Use Authorization (EUA). This EUA will remain in effect (meaning this test can be used) for the duration of the COVID-19 declaration under Section 564(b)(1) of the Act, 21 U.S.C. section 360bbb-3(b)(1), unless the authorization is terminated or revoked.     Resp Syncytial Virus by PCR NEGATIVE NEGATIVE Final    Comment: (NOTE) Fact Sheet for Patients: EntrepreneurPulse.com.au  Fact Sheet for Healthcare  Providers: IncredibleEmployment.be  This test is not yet approved or cleared by the [place] FDA and has been authorized for detection and/or diagnosis of SARS-CoV-2 by FDA under an Emergency Use Authorization (EUA). This EUA will remain in effect (meaning this test can be used) for the duration of the COVID-19 declaration under Section 564(b)(1) of the Act, 21 U.S.C. section 360bbb-3(b)(1), unless the authorization is terminated or revoked.  Performed at [facility], [address] [place], [place] [address]   MRSA Next Gen by PCR, Nasal     Status: None   Collection Time: 12/18/22 10:15 AM   Specimen: Nasal Mucosa; Nasal Swab  Result Value Ref Range Status   MRSA by PCR Next Gen NOT DETECTED NOT DETECTED Final    Comment: (NOTE) The GeneXpert MRSA Assay (FDA approved for NASAL specimens only), is one component of a comprehensive MRSA colonization surveillance program. It is not intended to diagnose MRSA infection nor to guide or monitor treatment for MRSA infections. Test performance is not FDA approved in patients less than 6 years old. Performed at [facility], [address] [place], [place] [address]  Coagulation Studies: No results for input(s): "LABPROT", "INR" in the last 72 hours.  Urinalysis: Recent Labs    12/16/22 1305  COLORURINE YELLOW*  LABSPEC 1.026  PHURINE 7.0  GLUCOSEU NEGATIVE  HGBUR NEGATIVE  BILIRUBINUR NEGATIVE  KETONESUR NEGATIVE  PROTEINUR 30*  NITRITE NEGATIVE  LEUKOCYTESUR NEGATIVE      Imaging: DG Chest Port 1 View  Result Date: 12/17/2022 CLINICAL DATA:  Respiratory failure EXAM: PORTABLE CHEST 1 VIEW COMPARISON:  Yesterday FINDINGS: Cardiomegaly and vascular pedicle widening. Central vascular congestion and diffuse interstitial opacity. Trace pleural fluid is possible. No pneumothorax. IMPRESSION: CHF. Electronically Signed   By: Tiburcio Pea M.D.   On: 12/17/2022 08:39   CT  Angio Abd/Pel W and/or Wo Contrast  Result Date: 12/16/2022 CLINICAL DATA:  Mesenteric ischemia. EXAM: CTA ABDOMEN AND PELVIS WITHOUT AND WITH CONTRAST TECHNIQUE: Multidetector CT imaging of the abdomen and pelvis was performed using the standard protocol during bolus administration of intravenous contrast. Multiplanar reconstructed images and MIPs were obtained and reviewed to evaluate the vascular anatomy. RADIATION DOSE REDUCTION: This exam was performed according to the departmental dose-optimization program which includes automated exposure control, adjustment of the mA and/or kV according to patient size and/or use of iterative reconstruction technique. CONTRAST:  OMNIPAQUE IOHEXOL 350 MG/ML SOLN COMPARISON:  Abdominal x-ray 02/18/2022 FINDINGS: VASCULAR Aorta: Overall normal course and caliber of the abdominal aorta with minimal atherosclerotic disease. No dissection or aneurysm formation. Celiac: Standard branching pattern to the celiac axis. No ostial stenosis. Tortuous course of the splenic artery. SMA: Patent without evidence of aneurysm, dissection, vasculitis or significant stenosis. Renals: 2 left and single right main renal arteries. No significant stenosis. IMA: Patent. Inflow: No significant stenosis.  Minimal atherosclerotic disease. Proximal Outflow: Grossly preserved.  No significant stenosis. Veins: Somewhat distended IVC and central iliac vessels. Timing of the contrast is arterial. Review of the MIP images confirms the above findings. NON-VASCULAR Lower chest: Heart is enlarged. Small pericardial effusion. Tiny pleural effusions. There patchy lung base opacities with breathing motion. Possible enlarged node seen in the lower posterior mediastinum measured on series 6, image 1 measuring 2.2 x 1.6 cm. On prior chest CT of 01/30/2022 this node would have measured 17 x 12 mm. Hepatobiliary: Fatty liver infiltration. No obvious space-occupying liver lesion with the limits of the early phase  of the arterial bolus. The gallbladder is contracted with stones. Patent portal vein. Pancreas: Mild global pancreatic atrophy without obvious mass. Spleen: Spleen is nonenlarged. Adrenals/Urinary Tract: Adrenal glands are preserved. No enhancing renal mass. Right kidney is slightly malrotated. There is a Bosniak 1 cyst extending inferior from the mid right kidney measuring diameter approaching 4.4 cm with Hounsfield unit of 8. No aggressive features. Bosniak 1 lesion. No specific imaging follow-up recommended. No collecting system dilatation. The ureters have normal course and caliber down to the bladder. Preserved contours of the urinary bladder. Stomach/Bowel: On this non oral contrast exam large bowel has a normal course and caliber with scattered stool. The cecum resides in the central pelvis. Normal retrocecal appendix. The stomach has some luminal debris but is nondilated. Small bowel is nondilated. No free air. Lymphatic: Few small less than 1 cm in size in short axis nodes are seen along the retroperitoneum and mesentery, not pathologic by size criteria. Reproductive: Prominent prostate. Other: Large bilateral inguinal hernias. The right contains fat and fluid with stranding. The left similar but also contains the sigmoid colon. Due to the size of the hernia the caudal aspect of  the bowel and hernias not included in the imaging field. What are seen of the colon is nondilated. Musculoskeletal: Patient is tilted in the scanner. Scattered degenerative changes of the spine. There is some mild compression of the superior endplate of L3 but the margins are sclerotic and favor a chronic process. IMPRESSION: VASCULAR Minimal atherosclerotic disease. No significant stenosis or vessel occlusion of the central vessels of the abdomen or the aorta or iliac vessels. NON-VASCULAR Large bilateral inguinal hernias. The right containing fat, fluid and stranding. The left containing the sigmoid colon. This is a wide mouth  hernia with no signs of obstruction Note the entirety of the sigmoid colon is not completely included in the imaging field due to the size of the hernia and extension caudal. Mild ascites with anasarca. Trace pleural fluid. The heart is enlarged. Slightly enlarged lower posterior right-sided mediastinal lymph node at the edge of the imaging field compared to an older study. Recommend dedicated chest workup when appropriate. Lung base parenchymal opacities. Electronically Signed   By: Karen Kays M.D.   On: 12/16/2022 17:11   DG Chest 1 View  Result Date: 12/16/2022 CLINICAL DATA:  Dyspnea EXAM: CHEST  1 VIEW COMPARISON:  Chest radiograph 05/23/2022 FINDINGS: The heart is enlarged. The upper mediastinal contours are within normal limits There is prominence of the pulmonary vasculature with diffuse increased interstitial markings likely reflecting pulmonary interstitial edema. There are suspected small bilateral pleural effusions. There is no pneumothorax There is no acute osseous abnormality. IMPRESSION: Cardiomegaly with pulmonary interstitial edema and likely trace effusions. Findings may reflect CHF. Electronically Signed   By: Lesia Hausen M.D.   On: 12/16/2022 12:56     Medications:     apixaban  5 mg Oral BID   budesonide (PULMICORT) nebulizer solution  0.5 mg Nebulization BID   Chlorhexidine Gluconate Cloth  6 each Topical Daily   diltiazem  60 mg Oral Q6H   ipratropium-albuterol  3 mL Nebulization TID   methylPREDNISolone (SOLU-MEDROL) injection  40 mg Intravenous Q12H   metoprolol tartrate  50 mg Oral BID   sodium zirconium cyclosilicate  10 g Oral Daily   acetaminophen, ipratropium-albuterol, ondansetron (ZOFRAN) IV  Assessment/ Plan:  [name] is a 62 y.o.  male  who was admitted to [facility] on 12/16/2022 for Atrial flutter (HCC) [I48.92] . Patient has a history of asthma, COPD, tobacco abuse who originally presented for right sided abdominal pain was found to be in rapid  A-fib/flutter.    Acute Kidney Injury      - likely secondary to IV contrast exposure. May be a component of over diuresis as well.     - continue to monitor serum creatinine, creatinine continues to trend upward.     - agree to hold IV lasix, lower extremity edema improved, no crackles on exam.     - urine output remains low at 650 cc   Lab Results  Component Value Date   CREATININE 1.82 (H) 12/18/2022   CREATININE 1.83 (H) 12/18/2022   CREATININE 1.57 (H) 12/17/2022          2. Hyperkalemia    - patient serum K increased to 6.0, lokelma 10g administered yesterday, initially potassium was 3.9 on admission    - continue lokelma 10g orally once a day.    - discussed restricting potassium from the diet with the patient. He voiced understanding.      Latest Ref Rng & Units 12/18/2022    8:44 AM 12/18/2022  5:20 AM 12/17/2022    3:10 PM  BMP  Glucose 70 - 99 mg/dL 146  154  171   BUN 6 - 20 mg/dL 48  47  34   Creatinine 0.61 - 1.24 mg/dL 1.82  1.83  1.57   Sodium 135 - 145 mmol/L 131  132  132   Potassium 3.5 - 5.1 mmol/L 5.8  6.0  5.9   Chloride 98 - 111 mmol/L 98  97  97   CO2 22 - 32 mmol/L 26  27  28    Calcium 8.9 - 10.3 mg/dL 7.5  7.7  7.4         LOS: 2 Helaina Stefano P Amorita Vanrossum 1/21/202411:37 AM

## 2022-12-18 NOTE — Assessment & Plan Note (Signed)
Complicates overall prognosis. 

## 2022-12-18 NOTE — Consult Note (Signed)
NAME:  Kenneth Benson, MRN:  [id number], DOB:  10-29-1961, LOS: 2  CHIEF COMPLAINT:  SOB  BRIEF SYNOPSIS +SMOKER, COPD, AFIB WITH RVR  History of Present Illness:   62 y.o. male with medical history significant of asthma, COPD, ongoing tobacco abuse, atrial fibrillation came in for right-sided abdominal pain.    While he was at work he started having tachycardia shortness of breath and difficulty breathing.    Decided to come to the emergency department while in the ED he underwent CT abdomen as CDP was concerned about mesenteric ischemia but was ruled out.    It did show large bilateral inguinal hernia.   He does have mediastinal lymphadenopathy as well.    He was found to be in rapid A-fib/flutter with a heart rate in 150 and was given Cardizem bolus and started on Cardizem drip.  He also became hypoxic with oxygen saturations in mid 80s requiring BiPAP while in the ED.  Chest x-ray was showing pulmonary edema and CHF pattern so was given Lasix.    PCCM consulted for bIPAP, last night patient was weaned off BiPAP and eating dinner, no major complaints last night 1/19.  Patient was seen this AM, after recommendation of bIPAP for presumed OSA This AM, patient is more SOB, creatinine worsening, on Cardizem infusion  +WHEEZING THIS AM WILL START STEROIDS AND NEB THERAPY CARDIOLOGY CONSULT PENDING      Significant Hospital Events: Including procedures, antibiotic start and stop dates in addition to other pertinent events   1/19 admitted for acute afib with RVR 1/20 wheezing and COPD exacerbation 1/21 tolerated nocturnal biPAP, wheezing resolved     Micro Data:  COVID/FLU/RSV NEG  Antimicrobials:   Antibiotics Given (last 72 hours)     Date/Time Action Medication Dose Rate   12/16/22 1418 New Bag/Given   piperacillin-tazobactam (ZOSYN) IVPB 3.375 g 3.375 g 100 mL/hr   12/16/22 1456 New Bag/Given   vancomycin (VANCOREADY) IVPB 2000 mg/400 mL 2,000 mg 200 mL/hr         Interim History / Subjective:  Alert and awake LESS SOB WHEEZING RESOLVED TOLERATED BIPAP NO SIGNIFICANT RESP DISTRESS WANTS BREAKFAST ASKING TO GO HOME     Objective   Blood pressure 101/86, pulse 72, temperature 97.8 F (36.6 C), resp. rate (!) 28, height 6\' 1"  (1.854 m), weight 99.8 kg, SpO2 94 %.    FiO2 (%):  [60 %-70 %] 60 %   Intake/Output Summary (Last 24 hours) at 12/18/2022 0728 Last data filed at 12/17/2022 1700 Gross per 24 hour  Intake 65.87 ml  Output 650 ml  Net -584.13 ml    Filed Weights   12/17/22 1746  Weight: 99.8 kg       Review of Systems: Gen:  Denies  fever, sweats, chills weight loss  HEENT: Denies blurred vision, double vision, ear pain, eye pain, hearing loss, nose bleeds, sore throat Cardiac:  No dizziness, chest pain or heaviness, chest tightness,edema, No JVD Resp:   +shortness of breath,-wheezing, -hemoptysis,  Other:  All other systems negative    Physical Examination:   General Appearance: No distress  EYES PERRLA, EOM intact.   NECK Supple, No JVD Pulmonary: normal breath sounds, minimal  wheezing.  CardiovascularNormal S1,S2.  No m/r/g.   Abdomen: Benign, Soft, non-tender. ALL OTHER ROS ARE NEGATIVE     Labs/imaging that I havepersonally reviewed  (right click and "Reselect all SmartList Selections" daily)      ASSESSMENT AND PLAN SYNOPSIS  62 yo obese  white male with acute SOB due to acute Afib with RVR and acute COPD exacerbation  COPD/presumed OSA bIPAP as needed Oxygen as needed   COPD EXACERBATION -continue IV steroids as prescribed -continue NEB THERAPY as prescribed  CARDIAC FAILURE due to AFib with RVR on Cardizem infusion -oxygen as needed -Lasix as tolerated -follow up cardiac enzymes as indicated Follow up cardiology recs  CARDIAC ICU monitoring  ACUTE KIDNEY INJURY/Renal Failure -continue Foley Catheter-assess need -Avoid nephrotoxic agents -Follow urine output, BMP -Ensure  adequate renal perfusion, optimize oxygenation -Renal dose medications   Intake/Output Summary (Last 24 hours) at 12/18/2022 0728 Last data filed at 12/17/2022 1700 Gross per 24 hour  Intake 65.87 ml  Output 650 ml  Net -584.13 ml       Latest Ref Rng & Units 12/18/2022    5:20 AM 12/17/2022    3:10 PM 12/17/2022    6:15 AM  BMP  Glucose 70 - 99 mg/dL 154  171  189   BUN 6 - 20 mg/dL 47  34  30   Creatinine 0.61 - 1.24 mg/dL 1.83  1.57  1.56   Sodium 135 - 145 mmol/L 132  132  136   Potassium 3.5 - 5.1 mmol/L 6.0  5.9  5.9   Chloride 98 - 111 mmol/L 97  97  102   CO2 22 - 32 mmol/L 27  28  28    Calcium 8.9 - 10.3 mg/dL 7.7  7.4  7.7   Follow up nephrology recs  ENDO - ICU hypoglycemic\Hyperglycemia protocol -check FSBS per protocol   GI GI PROPHYLAXIS as indicated  NUTRITIONAL STATUS DIET--> as tolerated Constipation protocol as indicated   ELECTROLYTES -follow labs as needed -replace as needed -pharmacy consultation and following    Labs   CBC: Recent Labs  Lab 12/16/22 1305 12/17/22 0615 12/18/22 0520  WBC 8.4 10.3 10.9*  NEUTROABS 6.8  --   --   HGB 13.7 14.1 13.8  HCT 44.2 47.5 45.6  MCV 95.1 99.0 97.2  PLT 178 199 189     Basic Metabolic Panel: Recent Labs  Lab 12/16/22 1305 12/17/22 0615 12/17/22 1510 12/18/22 0520  NA 142 136 132* 132*  K 3.9 5.9* 5.9* 6.0*  CL 105 102 97* 97*  CO2 29 28 28 27   GLUCOSE 111* 189* 171* 154*  BUN 21* 30* 34* 47*  CREATININE 0.91 1.56* 1.57* 1.83*  CALCIUM 8.3* 7.7* 7.4* 7.7*  MG  --  2.2  --   --     GFR: Estimated Creatinine Clearance: 53.4 mL/min (A) (by C-G formula based on SCr of 1.83 mg/dL (H)). Recent Labs  Lab 12/16/22 1305 12/17/22 0615 12/18/22 0520  WBC 8.4 10.3 10.9*  LATICACIDVEN 1.3  --   --      Liver Function Tests: Recent Labs  Lab 12/16/22 1305  AST 22  ALT 29  ALKPHOS 44  BILITOT 1.3*  PROT 7.0  ALBUMIN 3.8    Recent Labs  Lab 12/16/22 1305  LIPASE 30     No results for input(s): "AMMONIA" in the last 168 hours.  ABG    Component Value Date/Time   PHART 7.51 (H) 02/04/2022 0500   PCO2ART 55 (H) 02/04/2022 0500   PO2ART 59 (L) 02/04/2022 0500   HCO3 31.6 (H) 12/16/2022 1413   O2SAT 52.1 12/16/2022 1413      HbA1C: Hgb A1c MFr Bld  Date/Time Value Ref Range Status  01/21/2022 02:36 PM 4.9 4.8 - 5.6 % Final  Comment:    (NOTE) Pre diabetes:          5.7%-6.4%  Diabetes:              >6.4%  Glycemic control for   <7.0% adults with diabetes     Home Medications  Prior to Admission medications   Medication Sig Start Date End Date Taking? Authorizing Provider  albuterol (PROVENTIL HFA) 108 (90 Base) MCG/ACT inhaler Inhale 2 puffs into the lungs every 6 (six) hours as needed for wheezing. 02/11/22  Yes Dwyane Dee, MD  apixaban (ELIQUIS) 5 MG TABS tablet Take 1 tablet (5 mg total) by mouth 2 (two) times daily. 02/11/22 06/18/22  Dwyane Dee, MD  metoprolol tartrate (LOPRESSOR) 25 MG tablet Take 1 tablet (25 mg total) by mouth 2 (two) times daily. Patient not taking: Reported on 12/16/2022 02/11/22   Dwyane Dee, MD  predniSONE (DELTASONE) 5 MG tablet Take 3 tablets once in the morning on Day 1, then 2 tablets once in the morning on Day 2, then 1 tablet in the morning on day 3. Patient not taking: Reported on 12/16/2022 02/11/22   Dwyane Dee, MD  amiodarone (PACERONE) 200 MG tablet Take 1 tablet (200 mg total) by mouth 2 (two) times daily. 02/11/22 03/15/22  Dwyane Dee, MD       Corrin Parker, M.D.  [facility]  Medical Director Grill Director [facility] Cardio-Pulmonary Department

## 2022-12-18 NOTE — Assessment & Plan Note (Signed)
Chest x-ray showing cardiomegaly with pulmonary interstitial edema and likely trace effusions. Findings may reflect CHF.   Holding Lasix as his kidney function worsened Strict I's and O's Daily weight Pending 2D echo Cardiology following Net IO Since Admission: -744.33 mL [12/18/22 1633]

## 2022-12-18 NOTE — Assessment & Plan Note (Signed)
Followed by nephrology.  On Lokelma twice daily Will check potassium every 4 hours for now till normal and monitor in stepdown

## 2022-12-18 NOTE — Progress Notes (Signed)
[facility] Cardiology  SUBJECTIVE: Patient laying in bed, denies chest pain or palpitations   Vitals:   12/18/22 0506 12/18/22 0808 12/18/22 0830 12/18/22 0833  BP:  106/82    Pulse:  (!) 114  (!) 138  Resp:      Temp:      TempSrc:      SpO2: 94% 96% 96% 95%  Weight:      Height:         Intake/Output Summary (Last 24 hours) at 12/18/2022 0843 Last data filed at 12/17/2022 1700 Gross per 24 hour  Intake 65.87 ml  Output 650 ml  Net -584.13 ml      PHYSICAL EXAM  General: Well developed, well nourished, in no acute distress HEENT:  Normocephalic and atramatic Neck:  No JVD.  Lungs: Clear bilaterally to auscultation and percussion. Heart: HRRR . Normal S1 and S2 without gallops or murmurs.  Abdomen: Bowel sounds are positive, abdomen soft and non-tender  Msk:  Back normal, normal gait. Normal strength and tone for age. Extremities: No clubbing, cyanosis or edema.   Neuro: Alert and oriented X 3. Psych:  Good affect, responds appropriately   LABS: Basic Metabolic Panel: Recent Labs    12/17/22 0615 12/17/22 1510 12/18/22 0520  NA 136 132* 132*  K 5.9* 5.9* 6.0*  CL 102 97* 97*  CO2 28 28 27   GLUCOSE 189* 171* 154*  BUN 30* 34* 47*  CREATININE 1.56* 1.57* 1.83*  CALCIUM 7.7* 7.4* 7.7*  MG 2.2  --   --    Liver Function Tests: Recent Labs    12/16/22 1305  AST 22  ALT 29  ALKPHOS 44  BILITOT 1.3*  PROT 7.0  ALBUMIN 3.8   Recent Labs    12/16/22 1305  LIPASE 30   CBC: Recent Labs    12/16/22 1305 12/17/22 0615 12/18/22 0520  WBC 8.4 10.3 10.9*  NEUTROABS 6.8  --   --   HGB 13.7 14.1 13.8  HCT 44.2 47.5 45.6  MCV 95.1 99.0 97.2  PLT 178 199 189   Cardiac Enzymes: No results for input(s): "CKTOTAL", "CKMB", "CKMBINDEX", "TROPONINI" in the last 72 hours. BNP: Invalid input(s): "POCBNP" D-Dimer: No results for input(s): "DDIMER" in the last 72 hours. Hemoglobin A1C: No results for input(s): "HGBA1C" in the last 72 hours. Fasting Lipid  Panel: Recent Labs    12/17/22 0615  CHOL 150  HDL 66  LDLCALC 79  TRIG 24  CHOLHDL 2.3   Thyroid Function Tests: Recent Labs    12/16/22 1454  TSH 1.505   Anemia Panel: No results for input(s): "VITAMINB12", "FOLATE", "FERRITIN", "TIBC", "IRON", "RETICCTPCT" in the last 72 hours.  DG Chest Port 1 View  Result Date: 12/17/2022 CLINICAL DATA:  Respiratory failure EXAM: PORTABLE CHEST 1 VIEW COMPARISON:  Yesterday FINDINGS: Cardiomegaly and vascular pedicle widening. Central vascular congestion and diffuse interstitial opacity. Trace pleural fluid is possible. No pneumothorax. IMPRESSION: CHF. Electronically Signed   By: 12/19/2022 M.D.   On: 12/17/2022 08:39   CT Angio Abd/Pel W and/or Wo Contrast  Result Date: 12/16/2022 CLINICAL DATA:  Mesenteric ischemia. EXAM: CTA ABDOMEN AND PELVIS WITHOUT AND WITH CONTRAST TECHNIQUE: Multidetector CT imaging of the abdomen and pelvis was performed using the standard protocol during bolus administration of intravenous contrast. Multiplanar reconstructed images and MIPs were obtained and reviewed to evaluate the vascular anatomy. RADIATION DOSE REDUCTION: This exam was performed according to the departmental dose-optimization program which includes automated exposure control, adjustment of the  mA and/or kV according to patient size and/or use of iterative reconstruction technique. CONTRAST:  177mL OMNIPAQUE IOHEXOL 350 MG/ML SOLN COMPARISON:  Abdominal x-ray 02/18/2022 FINDINGS: VASCULAR Aorta: Overall normal course and caliber of the abdominal aorta with minimal atherosclerotic disease. No dissection or aneurysm formation. Celiac: Standard branching pattern to the celiac axis. No ostial stenosis. Tortuous course of the splenic artery. SMA: Patent without evidence of aneurysm, dissection, vasculitis or significant stenosis. Renals: 2 left and single right main renal arteries. No significant stenosis. IMA: Patent. Inflow: No significant stenosis.   Minimal atherosclerotic disease. Proximal Outflow: Grossly preserved.  No significant stenosis. Veins: Somewhat distended IVC and central iliac vessels. Timing of the contrast is arterial. Review of the MIP images confirms the above findings. NON-VASCULAR Lower chest: Heart is enlarged. Small pericardial effusion. Tiny pleural effusions. There patchy lung base opacities with breathing motion. Possible enlarged node seen in the lower posterior mediastinum measured on series 6, image 1 measuring 2.2 x 1.6 cm. On prior chest CT of 01/30/2022 this node would have measured 17 x 12 mm. Hepatobiliary: Fatty liver infiltration. No obvious space-occupying liver lesion with the limits of the early phase of the arterial bolus. The gallbladder is contracted with stones. Patent portal vein. Pancreas: Mild global pancreatic atrophy without obvious mass. Spleen: Spleen is nonenlarged. Adrenals/Urinary Tract: Adrenal glands are preserved. No enhancing renal mass. Right kidney is slightly malrotated. There is a Bosniak 1 cyst extending inferior from the mid right kidney measuring diameter approaching 4.4 cm with Hounsfield unit of 8. No aggressive features. Bosniak 1 lesion. No specific imaging follow-up recommended. No collecting system dilatation. The ureters have normal course and caliber down to the bladder. Preserved contours of the urinary bladder. Stomach/Bowel: On this non oral contrast exam large bowel has a normal course and caliber with scattered stool. The cecum resides in the central pelvis. Normal retrocecal appendix. The stomach has some luminal debris but is nondilated. Small bowel is nondilated. No free air. Lymphatic: Few small less than 1 cm in size in short axis nodes are seen along the retroperitoneum and mesentery, not pathologic by size criteria. Reproductive: Prominent prostate. Other: Large bilateral inguinal hernias. The right contains fat and fluid with stranding. The left similar but also contains the  sigmoid colon. Due to the size of the hernia the caudal aspect of the bowel and hernias not included in the imaging field. What are seen of the colon is nondilated. Musculoskeletal: Patient is tilted in the scanner. Scattered degenerative changes of the spine. There is some mild compression of the superior endplate of L3 but the margins are sclerotic and favor a chronic process. IMPRESSION: VASCULAR Minimal atherosclerotic disease. No significant stenosis or vessel occlusion of the central vessels of the abdomen or the aorta or iliac vessels. NON-VASCULAR Large bilateral inguinal hernias. The right containing fat, fluid and stranding. The left containing the sigmoid colon. This is a wide mouth hernia with no signs of obstruction Note the entirety of the sigmoid colon is not completely included in the imaging field due to the size of the hernia and extension caudal. Mild ascites with anasarca. Trace pleural fluid. The heart is enlarged. Slightly enlarged lower posterior right-sided mediastinal lymph node at the edge of the imaging field compared to an older study. Recommend dedicated chest workup when appropriate. Lung base parenchymal opacities. Electronically Signed   By: Jill Side M.D.   On: 12/16/2022 17:11   DG Chest 1 View  Result Date: 12/16/2022 CLINICAL DATA:  Dyspnea  EXAM: CHEST  1 VIEW COMPARISON:  Chest radiograph 05/23/2022 FINDINGS: The heart is enlarged. The upper mediastinal contours are within normal limits There is prominence of the pulmonary vasculature with diffuse increased interstitial markings likely reflecting pulmonary interstitial edema. There are suspected small bilateral pleural effusions. There is no pneumothorax There is no acute osseous abnormality. IMPRESSION: Cardiomegaly with pulmonary interstitial edema and likely trace effusions. Findings may reflect CHF. Electronically Signed   By: Valetta Mole M.D.   On: 12/16/2022 12:56     Echo MD  TELEMETRY: Fibrillation at 122  bpm:  ASSESSMENT AND PLAN:  Principal Problem:   Atrial fibrillation with RVR (HCC) Active Problems:   COPD exacerbation (HCC)   Acute hypoxic respiratory failure (HCC)   Overweight (BMI 25.0-29.9)   Acute on chronic diastolic CHF (congestive heart failure) (HCC)   AKI (acute kidney injury) (HCC)   Hyperkalemia    1. Atrial fibrillation with rapid ventricular rate, started on Eliquis for stroke prevention, and metoprolol  tartrate and Cardizem bolus and infusion for rate and rhythm control.  Cardizem infusion transition to diltiazem 60 mg p.o. every 6 hours.  Heart rate remains elevated though patient currently asymptomatic. 2.  Acute hypoxic respiratory failure, secondary to COPD exacerbation 3.  Chronic diastolic congestive heart failure, chest x-ray revealing pulmonary interstitial edema, 2D echocardiogram 01/21/2022 revealed LVEF 60-65%   Recommendations   1.  Agree with current therapy 2.  Continue Eliquis for stroke prevention 3.  Continue diltiazem 60 mg p.o. every 6 hours 4.  Continue metoprolol tartrate 50 mg twice daily 5.  Review 2D echocardiogram   Isaias Cowman, MD, PhD, [facility] 12/18/2022 8:43 AM

## 2022-12-19 DIAGNOSIS — J9601 Acute respiratory failure with hypoxia: Secondary | ICD-10-CM | POA: Diagnosis not present

## 2022-12-19 DIAGNOSIS — J441 Chronic obstructive pulmonary disease with (acute) exacerbation: Secondary | ICD-10-CM | POA: Diagnosis not present

## 2022-12-19 DIAGNOSIS — I4891 Unspecified atrial fibrillation: Secondary | ICD-10-CM | POA: Diagnosis not present

## 2022-12-19 DIAGNOSIS — E663 Overweight: Secondary | ICD-10-CM | POA: Diagnosis not present

## 2022-12-19 LAB — BASIC METABOLIC PANEL
Anion gap: 7 (ref 5–15)
BUN: 61 mg/dL — ABNORMAL HIGH (ref 6–20)
CO2: 29 mmol/L (ref 22–32)
Calcium: 8 mg/dL — ABNORMAL LOW (ref 8.9–10.3)
Chloride: 95 mmol/L — ABNORMAL LOW (ref 98–111)
Creatinine, Ser: 1.74 mg/dL — ABNORMAL HIGH (ref 0.61–1.24)
GFR, Estimated: 44 mL/min — ABNORMAL LOW (ref >60)
Glucose, Bld: 154 mg/dL — ABNORMAL HIGH (ref 70–99)
Potassium: 5.8 mmol/L — ABNORMAL HIGH (ref 3.5–5.1)
Sodium: 131 mmol/L — ABNORMAL LOW (ref 135–145)

## 2022-12-19 LAB — ECHOCARDIOGRAM COMPLETE
AR max vel: 2.63 cm2
AV Area VTI: 2.66 cm2
AV Area mean vel: 2.61 cm2
AV Mean grad: 4 mmHg
AV Peak grad: 7.2 mmHg
Ao pk vel: 1.34 m/s
Area-P 1/2: 3.78 cm2
Calc EF: 25.2 %
Height: 73 in
MV M vel: 3.58 m/s
MV Peak grad: 51.4 mmHg
MV VTI: 3.64 cm2
Radius: 0.3 cm
S' Lateral: 5.3 cm
Single Plane A2C EF: 24.5 %
Single Plane A4C EF: 24.7 %
Weight: 4455.06 oz

## 2022-12-19 LAB — POTASSIUM
Potassium: 5.6 mmol/L — ABNORMAL HIGH (ref 3.5–5.1)
Potassium: 5.3 mmol/L — ABNORMAL HIGH (ref 3.5–5.1)
Potassium: 5.3 mmol/L — ABNORMAL HIGH (ref 3.5–5.1)
Potassium: 5.4 mmol/L — ABNORMAL HIGH (ref 3.5–5.1)
Potassium: 5.6 mmol/L — ABNORMAL HIGH (ref 3.5–5.1)

## 2022-12-19 LAB — CBC
HCT: 46.6 % (ref 39.0–52.0)
Hemoglobin: 14.2 g/dL (ref 13.0–17.0)
MCH: 29.6 pg (ref 26.0–34.0)
MCHC: 30.5 g/dL (ref 30.0–36.0)
MCV: 97.1 fL (ref 80.0–100.0)
Platelets: 201 10*3/uL (ref 150–400)
RBC: 4.8 MIL/uL (ref 4.22–5.81)
RDW: 15.9 % — ABNORMAL HIGH (ref 11.5–15.5)
WBC: 9.4 10*3/uL (ref 4.0–10.5)
nRBC: 0.2 % (ref 0.0–0.2)

## 2022-12-19 LAB — BLOOD GAS, VENOUS: Patient temperature: 37

## 2022-12-19 LAB — GLUCOSE, CAPILLARY: Glucose-Capillary: 155 mg/dL — ABNORMAL HIGH (ref 70–99)

## 2022-12-19 MED ORDER — METHYLPREDNISOLONE SODIUM SUCC 40 MG IJ SOLR
20.0000 mg | Freq: Two times a day (BID) | INTRAMUSCULAR | Status: DC
  Administered 2022-12-19: 20 mg via INTRAVENOUS
  Filled 2022-12-19: qty 1

## 2022-12-19 MED ORDER — POLYETHYLENE GLYCOL 3350 17 G PO PACK
17.0000 g | PACK | Freq: Every day | ORAL | Status: DC
  Administered 2022-12-19 – 2022-12-24 (×6): 17 g via ORAL
  Filled 2022-12-19 (×6): qty 1

## 2022-12-19 MED ORDER — DOCUSATE SODIUM 100 MG PO CAPS: 100.0000 mg | ORAL_CAPSULE | Freq: Every day | ORAL | Status: DC | PRN

## 2022-12-19 MED ORDER — MOMETASONE FURO-FORMOTEROL FUM 200-5 MCG/ACT IN AERO
2.0000 | INHALATION_SPRAY | Freq: Two times a day (BID) | RESPIRATORY_TRACT | Status: DC
  Administered 2022-12-19 – 2022-12-24 (×10): 2 via RESPIRATORY_TRACT
  Filled 2022-12-19: qty 8.8

## 2022-12-19 MED ORDER — DOCUSATE SODIUM 100 MG PO CAPS
100.0000 mg | ORAL_CAPSULE | Freq: Two times a day (BID) | ORAL | Status: DC
  Administered 2022-12-19 – 2022-12-24 (×11): 100 mg via ORAL
  Filled 2022-12-19 (×11): qty 1

## 2022-12-19 MED ORDER — PREDNISONE 20 MG PO TABS
20.0000 mg | ORAL_TABLET | Freq: Every day | ORAL | Status: DC
  Administered 2022-12-20 – 2022-12-23 (×4): 20 mg via ORAL
  Filled 2022-12-19 (×4): qty 1

## 2022-12-19 MED ORDER — FAMOTIDINE 20 MG PO TABS
40.0000 mg | ORAL_TABLET | Freq: Every day | ORAL | Status: DC
  Administered 2022-12-19 – 2022-12-24 (×6): 40 mg via ORAL
  Filled 2022-12-19 (×6): qty 2

## 2022-12-19 MED ORDER — POLYETHYLENE GLYCOL 3350 17 G PO PACK: 17.0000 g | PACK | Freq: Every day | ORAL | Status: DC | PRN

## 2022-12-19 NOTE — Assessment & Plan Note (Signed)
Likely multifactorial from possible ATN due to hypotension, contrast-induced nephropathy, overdiuresis with Lasix .  Hold Lasix for now.  Nephrology following  Lab Results  Component Value Date   CREATININE 1.74 (H) 12/19/2022   CREATININE 1.82 (H) 12/18/2022   CREATININE 1.83 (H) 12/18/2022

## 2022-12-19 NOTE — Progress Notes (Signed)
[facility] Cardiology  Patient ID: NICKOLAI RINKS MRN: [id number] DOB/AGE: Sep 17, 1961 62 y.o.   Admit date: 12/16/2022 Referring Physician Manuella Ghazi Primary Physician none Primary Cardiologist Corky Sox Reason for Consultation atrial fibrillation  HPI: Jaskirat Schwieger. Keadle is a 73 YOM with a PMH of asthma, COPD, ongoing tobacco use, paroxysmal AF on Eliquis who presented to [facility] ED 12/16/2022 with a chief complaint of right-sided abdominal pain.  There was concern for mesenteric ischemia however CT was unremarkable with exception of large bilateral inguinal hernia.  The patient was noted to be in atrial fibrillation with rapid ventricular rate at 153 bpm.  Patient was treated with Eliquis, metoprolol tartrate 25 mg twice daily, and Cardizem bolus and infusion.  Initially required BiPAP, now weaned to [facility] and is being treated for a COPD exacerbation.  Nephrology also following for AKI and hyperkalemia.  Interval history: - feels "fine," no more short of breath than usual. No chest pain or heart racing. No flank pain. Admits to dependent peripheral edema at home  - still coughing. Remains in AF rates 100-120s with frequent PVCs, self discontinued eliquis prior to this admission    Vitals:   12/19/22 0631 12/19/22 0700 12/19/22 0800 12/19/22 0809  BP:  117/87    Pulse: (!) 54 (!) 50    Resp: (!) 27 (!) 26    Temp:   98.6 F (37 C)   TempSrc:   Oral   SpO2: 100% 92%  94%  Weight:      Height:         Intake/Output Summary (Last 24 hours) at 12/19/2022 0912 Last data filed at 12/19/2022 0900 Gross per 24 hour  Intake 758.69 ml  Output 1450 ml  Net -691.31 ml       PHYSICAL EXAM  General: ill appearing caucasian male, sitting upright in ICU bed, finished breakfast tray at bedside HEENT:  Normocephalic and atraumatic Neck:  No JVD.  Lungs: slight conversational dyspnea on HFNC, diffuse expiratory wheezing and cough with deep inspiration Heart: irregularly irregular with mostly controlled rate .  Normal S1 and S2 without gallops or murmurs.  Abdomen: nondistended appearing with excess adiposity  Msk:   Normal strength and tone for age. Extremities: No clubbing, cyanosis. Trace bilateral  edema.   Neuro: Alert and oriented X 3. Psych:  Good affect, responds appropriately   LABS: Basic Metabolic Panel: Recent Labs    12/17/22 0615 12/17/22 1510 12/18/22 0844 12/18/22 1149 12/18/22 2014 12/19/22 0431 12/19/22 0844  NA 136   < > 131*  --   --  131*  --   K 5.9*   < > 5.8* 5.7*   < > 5.8* 5.3*  CL 102   < > 98  --   --  95*  --   CO2 28   < > 26  --   --  29  --   GLUCOSE 189*   < > 146* 114*  --  154*  --   BUN 30*   < > 48*  --   --  61*  --   CREATININE 1.56*   < > 1.82*  --   --  1.74*  --   CALCIUM 7.7*   < > 7.5*  --   --  8.0*  --   MG 2.2  --   --   --   --   --   --    < > = values in this interval not displayed.    Liver  Function Tests: Recent Labs    12/16/22 1305  AST 22  ALT 29  ALKPHOS 44  BILITOT 1.3*  PROT 7.0  ALBUMIN 3.8    Recent Labs    12/16/22 1305  LIPASE 30    CBC: Recent Labs    12/16/22 1305 12/17/22 0615 12/18/22 0520 12/19/22 0431  WBC 8.4   < > 10.9* 9.4  NEUTROABS 6.8  --   --   --   HGB 13.7   < > 13.8 14.2  HCT 44.2   < > 45.6 46.6  MCV 95.1   < > 97.2 97.1  PLT 178   < > 189 201   < > = values in this interval not displayed.    Cardiac Enzymes: No results for input(s): "CKTOTAL", "CKMB", "CKMBINDEX", "TROPONINI" in the last 72 hours. BNP: Invalid input(s): "POCBNP" D-Dimer: No results for input(s): "DDIMER" in the last 72 hours. Hemoglobin A1C: No results for input(s): "HGBA1C" in the last 72 hours. Fasting Lipid Panel: Recent Labs    12/17/22 0615  CHOL 150  HDL 66  LDLCALC 79  TRIG 24  CHOLHDL 2.3    Thyroid Function Tests: Recent Labs    12/16/22 1454  TSH 1.505    Anemia Panel: No results for input(s): "VITAMINB12", "FOLATE", "FERRITIN", "TIBC", "IRON", "RETICCTPCT" in the last 72  hours.  No results found.   Echo 01/29/2022: EF 50-55%, trivial MR  TELEMETRY: Fibrillation at 122 bpm:  ASSESSMENT AND PLAN:  Principal Problem:   Atrial fibrillation with RVR (HCC) Active Problems:   COPD exacerbation (HCC)   Acute hypoxic respiratory failure (HCC)   Overweight (BMI 25.0-29.9)   Acute on chronic diastolic CHF (congestive heart failure) (HCC)   AKI (acute kidney injury) (HCC)   Hyperkalemia   1. Atrial fibrillation with rapid ventricular rate, started on Eliquis for stroke prevention, and metoprolol  tartrate and Cardizem bolus and infusion for rate and rhythm control.  Cardizem infusion transition to diltiazem 60 mg p.o. every 6 hours.  Heart rate remains elevated though patient currently asymptomatic. 2.  Acute hypoxic respiratory failure, secondary to COPD exacerbation 3.  Chronic diastolic congestive heart failure, chest x-ray revealing pulmonary interstitial edema, 2D echocardiogram 01/21/2022 revealed LVEF 60-65% 4.   AKI, felt to be from IV contrast vs overdiuresis, nephrology following 5.  Hyperkalemia, s/p lokelma  6.  Tobacco abuse   Recommendations   1.  Agree with current therapy 2.  Continue Eliquis for stroke prevention 3.  Continue diltiazem 60 mg p.o. every 6 hours 4.  Continue metoprolol tartrate 50 mg twice daily 5.  2D echocardiogram, pending read 6.  Strongly encouraged complete smoking cessation  This patient's plan of care was discussed and created with Dr. Saralyn Pilar and he is in agreement.    Tristan Schroeder, PA-C 12/19/2022 9:12 AM

## 2022-12-19 NOTE — Assessment & Plan Note (Signed)
Initial Chest x-ray showing cardiomegaly with pulmonary interstitial edema and likely trace effusions. Findings may reflect CHF.   Holding Lasix as his kidney function worsened Strict I's and O's Daily weight Pending 2D echo Cardiology following Net IO Since Admission: -1,854.33 mL [12/19/22 1958]

## 2022-12-19 NOTE — Assessment & Plan Note (Signed)
change Solu-Medrol to Prednisone Weaned off BiPAP to high flow nasal cannula Continue inhalers and nebulizers COVID and influenza negative PCCM signed off.

## 2022-12-19 NOTE — Plan of Care (Signed)
Patient respiratory  status much improved, transfer back to gen med floor Weaning off oxygen, tolerating oral meds for afib and oral AC No indications for HD at this time, K is improving I switched to oral prednisone, will start Dulera therapy  INHALED THERAPY ADJUSTED RECOMMEND AMBULATING PULSE OX PRIOR TO DISCHARGE  WILL SET UP FOLLOW APPOINTMENT FOR [facility] AS OUTPATIENT  Will sign off at this time    Corrin Parker, M.D.  [facility]  Medical Director [place] Director [facility] Cardio-Pulmonary Department

## 2022-12-19 NOTE — Assessment & Plan Note (Signed)
Complicates overall prognosis. 

## 2022-12-19 NOTE — Progress Notes (Signed)
[facility]  ROUNDING NOTE   Subjective:   [name] is a 61 y.o.  male with , who was admitted to [facility] on 12/16/2022 for Atrial flutter ([place]) [I48.92] . Patient has a history of asthma, COPD, tobacco abuse who originally presented for right sided abdominal pain was found to be in rapid A-fib/flutter.    Patient seen and evaluated at bedside in ICU Sitting up in bed Alert and oriented Awaiting breakfast, asking appropriate questions about low potassium foods and hydration No lower extremity edema Remains on high flow nasal cannula  Objective:  Vital signs in last 24 hours:  Temp:  [98.6 F (37 C)-99 F (37.2 C)] 98.6 F (37 C) (01/22 0800) Pulse Rate:  [31-149] 50 (01/22 0700) Resp:  [18-31] 26 (01/22 0700) BP: (97-118)/(61-93) 117/87 (01/22 0700) SpO2:  [73 %-100 %] 94 % (01/22 0809) FiO2 (%):  [60 %] 60 % (01/22 0200)  Weight change: 26.5 kg Filed Weights   12/17/22 1746 12/18/22 1010  Weight: 99.8 kg 126.3 kg    Intake/Output: I/O last 3 completed shifts: In: 758.7 [P.O.:240; I.V.:15; IV Piggyback:503.7] Out: 850 [Urine:850]   Intake/Output this shift:  Total I/O In: -  Out: 600 [Urine:600]  Physical Exam: General: NAD  Head: Normocephalic, atraumatic. Moist oral mucosal membranes  Eyes: Anicteric  Lungs:  Clear to auscultation, normal effort, HFNC  Heart: Regular rate and rhythm  Abdomen:  Soft, nontender  Extremities: No peripheral edema.  Neurologic: Nonfocal, moving all four extremities  Skin: No lesions  Access: None     Basic Metabolic Panel: Recent Labs  Lab 12/17/22 0615 12/17/22 1510 12/18/22 0520 12/18/22 0844 12/18/22 1149 12/18/22 2014 12/19/22 0030 12/19/22 0431 12/19/22 0844  NA 136 132* 132* 131*  --   --   --  131*  --   K 5.9* 5.9* 6.0* 5.8* 5.7* 5.5* 5.6* 5.8* 5.3*  CL 102 97* 97* 98  --   --   --  95*  --   CO2 28 28 27 26   --   --   --  29  --   GLUCOSE 189* 171* 154* 146* 114*  --   --  154*  --   BUN  30* 34* 47* 48*  --   --   --  61*  --   CREATININE 1.56* 1.57* 1.83* 1.82*  --   --   --  1.74*  --   CALCIUM 7.7* 7.4* 7.7* 7.5*  --   --   --  8.0*  --   MG 2.2  --   --   --   --   --   --   --   --      Liver Function Tests: Recent Labs  Lab 12/16/22 1305  AST 22  ALT 29  ALKPHOS 44  BILITOT 1.3*  PROT 7.0  ALBUMIN 3.8    Recent Labs  Lab 12/16/22 1305  LIPASE 30    No results for input(s): "AMMONIA" in the last 168 hours.  CBC: Recent Labs  Lab 12/16/22 1305 12/17/22 0615 12/18/22 0520 12/19/22 0431  WBC 8.4 10.3 10.9* 9.4  NEUTROABS 6.8  --   --   --   HGB 13.7 14.1 13.8 14.2  HCT 44.2 47.5 45.6 46.6  MCV 95.1 99.0 97.2 97.1  PLT 178 199 189 201     Cardiac Enzymes: No results for input(s): "CKTOTAL", "CKMB", "CKMBINDEX", "TROPONINI" in the last 168 hours.  BNP: Invalid input(s): "  POCBNP"  CBG: No results for input(s): "GLUCAP" in the last 168 hours.  Microbiology: Results for orders placed or performed during the hospital encounter of 12/16/22  Resp panel by RT-PCR (RSV, Flu A&B, Covid) Anterior Nasal Swab     Status: None   Collection Time: 12/16/22  1:18 PM   Specimen: Anterior Nasal Swab  Result Value Ref Range Status   SARS Coronavirus 2 by RT PCR NEGATIVE NEGATIVE Final    Comment: (NOTE) SARS-CoV-2 target nucleic acids are NOT DETECTED.  The SARS-CoV-2 RNA is generally detectable in upper respiratory specimens during the acute phase of infection. The lowest concentration of SARS-CoV-2 viral copies this assay can detect is 138 copies/mL. A negative result does not preclude SARS-Cov-2 infection and should not be used as the sole basis for treatment or other patient management decisions. A negative result may occur with  improper specimen collection/handling, submission of specimen other than nasopharyngeal swab, presence of viral mutation(s) within the areas targeted by this assay, and inadequate number of viral copies(<138  copies/mL). A negative result must be combined with clinical observations, patient history, and epidemiological information. The expected result is Negative.  Fact Sheet for Patients:  BloggerCourse.com  Fact Sheet for Healthcare Providers:  SeriousBroker.it  This test is no t yet approved or cleared by the [place] FDA and  has been authorized for detection and/or diagnosis of SARS-CoV-2 by FDA under an Emergency Use Authorization (EUA). This EUA will remain  in effect (meaning this test can be used) for the duration of the COVID-19 declaration under Section 564(b)(1) of the Act, 21 U.S.C.section 360bbb-3(b)(1), unless the authorization is terminated  or revoked sooner.       Influenza A by PCR NEGATIVE NEGATIVE Final   Influenza B by PCR NEGATIVE NEGATIVE Final    Comment: (NOTE) The Xpert Xpress SARS-CoV-2/FLU/RSV plus assay is intended as an aid in the diagnosis of influenza from Nasopharyngeal swab specimens and should not be used as a sole basis for treatment. Nasal washings and aspirates are unacceptable for Xpert Xpress SARS-CoV-2/FLU/RSV testing.  Fact Sheet for Patients: BloggerCourse.com  Fact Sheet for Healthcare Providers: SeriousBroker.it  This test is not yet approved or cleared by the [place] FDA and has been authorized for detection and/or diagnosis of SARS-CoV-2 by FDA under an Emergency Use Authorization (EUA). This EUA will remain in effect (meaning this test can be used) for the duration of the COVID-19 declaration under Section 564(b)(1) of the Act, 21 U.S.C. section 360bbb-3(b)(1), unless the authorization is terminated or revoked.     Resp Syncytial Virus by PCR NEGATIVE NEGATIVE Final    Comment: (NOTE) Fact Sheet for Patients: BloggerCourse.com  Fact Sheet for Healthcare  Providers: SeriousBroker.it  This test is not yet approved or cleared by the [place] FDA and has been authorized for detection and/or diagnosis of SARS-CoV-2 by FDA under an Emergency Use Authorization (EUA). This EUA will remain in effect (meaning this test can be used) for the duration of the COVID-19 declaration under Section 564(b)(1) of the Act, 21 U.S.C. section 360bbb-3(b)(1), unless the authorization is terminated or revoked.  Performed at [facility], [address] [name], [place] [address]   MRSA Next Gen by PCR, Nasal     Status: None   Collection Time: 12/18/22 10:15 AM   Specimen: Nasal Mucosa; Nasal Swab  Result Value Ref Range Status   MRSA by PCR Next Gen NOT DETECTED NOT DETECTED Final    Comment: (NOTE) The GeneXpert MRSA  Assay (FDA approved for NASAL specimens only), is one component of a comprehensive MRSA colonization surveillance program. It is not intended to diagnose MRSA infection nor to guide or monitor treatment for MRSA infections. Test performance is not FDA approved in patients less than 44 years old. Performed at [facility], [address] [place], [place] [address]     Coagulation Studies: No results for input(s): "LABPROT", "INR" in the last 72 hours.  Urinalysis: Recent Labs    12/16/22 1305  COLORURINE YELLOW*  LABSPEC 1.026  PHURINE 7.0  GLUCOSEU NEGATIVE  HGBUR NEGATIVE  BILIRUBINUR NEGATIVE  KETONESUR NEGATIVE  PROTEINUR 30*  NITRITE NEGATIVE  LEUKOCYTESUR NEGATIVE       Imaging: No results found.   Medications:     apixaban  5 mg Oral BID   budesonide (PULMICORT) nebulizer solution  0.5 mg Nebulization BID   Chlorhexidine Gluconate Cloth  6 each Topical Daily   diltiazem  60 mg Oral Q6H   ipratropium-albuterol  3 mL Nebulization TID   methylPREDNISolone (SOLU-MEDROL) injection  40 mg Intravenous Q12H   metoprolol tartrate  50 mg Oral BID   sodium zirconium  cyclosilicate  10 g Oral BID   acetaminophen, ipratropium-albuterol, ondansetron (ZOFRAN) IV  Assessment/ Plan:  [name] is a 62 y.o.  male  who was admitted to [facility] on 12/16/2022 for Atrial flutter ([place]) [I48.92] . Patient has a history of asthma, COPD, tobacco abuse who originally presented for right sided abdominal pain was found to be in rapid A-fib/flutter.    Acute Kidney Injury      - likely secondary to IV contrast exposure. May be a component of over diuresis as well.     - Creatinine improved today, urine output 834ml No acute need for dialysis. Continue to hold diuretics. Avoid nephrotoxic agents and therapies, including hypotension.    Lab Results  Component Value Date   CREATININE 1.74 (H) 12/19/2022   CREATININE 1.82 (H) 12/18/2022   CREATININE 1.83 (H) 12/18/2022          2. Hyperkalemia    - likely secondary to kidney injury and diet. Potassium 5.3 today. Continue Lokelma 10g twice a day for today. Discussed low potassium dietary options with patient and encouraged fluid intake.      Latest Ref Rng & Units 12/19/2022    8:44 AM 12/19/2022    4:31 AM 12/19/2022   12:30 AM  BMP  Glucose 70 - 99 mg/dL  154    BUN 6 - 20 mg/dL  61    Creatinine 0.61 - 1.24 mg/dL  1.74    Sodium 135 - 145 mmol/L  131    Potassium 3.5 - 5.1 mmol/L 5.3  5.8  5.6   Chloride 98 - 111 mmol/L  95    CO2 22 - 32 mmol/L  29    Calcium 8.9 - 10.3 mg/dL  8.0          LOS: 3 Cassidy Tabet 1/22/202410:16 AM

## 2022-12-19 NOTE — Progress Notes (Signed)
  Progress Note   Patient: Kenneth Benson ZOX:[id number] DOB: June 22, 1961 DOA: 12/16/2022     3 DOS: the patient was seen and examined on 12/19/2022   Brief hospital course: 62 year old male with a known history of COPD, asthma, ongoing tobacco abuse, A-fib is admitted for rapid A-fib/flutter and acute hypoxic respiratory failure requiring BiPAP  1/19: cardiology, intensivist consult 1/20: On oral Cardizem and metoprolol for rate control.  Off BiPAP 1/21: Nephrology consult for AKI and hyperkalemia, transferred to stepdown for close monitoring 1/22: transfer to PCU. K improving (still high), on HFNC.  Assessment and Plan: * Atrial fibrillation with RVR ([place]) He was first diagnosed with A-fib in February 2023 after a COPD flareup.  He had follow-up with Dr. Donnelly Angelica at [facility] in April 2023 -He had lost follow-up since then. Continue oral metoprolol and Cardizem for rate control Continue Eliquis 5 mg p.o. twice daily -2D echo shows EF 45-50% Cardio seen   Acute hypoxic respiratory failure (HCC) Likely due to COPD exacerbation and some fluid overload, received bolus of fluid in the ED. initially required BiPAP so was admitted for stepdown but later weaned off BiPAP to high flow nasal cannula -transfer out of stepdown to PCU.  PCCM signed off    COPD exacerbation ([place]) change Solu-Medrol to Prednisone Weaned off BiPAP to high flow nasal cannula Continue inhalers and nebulizers COVID and influenza negative PCCM signed off.  Overweight (BMI 25.0-29.9) Complicates overall prognosis  Hyperkalemia Followed by nephrology.  On Lokelma twice daily K 5.4  AKI (acute kidney injury) ([place]) Likely multifactorial from possible ATN due to hypotension, contrast-induced nephropathy, overdiuresis with Lasix .  Hold Lasix for now.  Nephrology following  Lab Results  Component Value Date   CREATININE 1.74 (H) 12/19/2022   CREATININE 1.82 (H) 12/18/2022   CREATININE 1.83 (H) 12/18/2022      Acute on chronic diastolic CHF (congestive heart failure) (HCC) Initial Chest x-ray showing cardiomegaly with pulmonary interstitial edema and likely trace effusions. Findings may reflect CHF.   Holding Lasix as his kidney function worsened Strict I's and O's Daily weight Pending 2D echo Cardiology following Net IO Since Admission: -1,854.33 mL [12/19/22 1958]           Subjective: feels better - eating breakfast still on HFNC  Physical Exam: Vitals:   12/19/22 1300 12/19/22 1344 12/19/22 1400 12/19/22 1947  BP: 108/75  92/71 112/80  Pulse: (!) 106  93 (!) 57  Resp: (!) 32  (!) 27 18  Temp:    97.7 F (36.5 C)  TempSrc:      SpO2: 93% 92% (!) 89% 95%  Weight:      Height:       62 year old disheveled looking man laying in the bed in mild acute respiratory distress Lungs decreased breath sounds at the bases, no wheezing, rales or rhonchi Cardiovascular irregularly irregular heart rate, Abdomen soft, obese, benign Neuro alert and awake, nonfocal Psych normal mood and affect Skin no obvious rash or lesion Extremities:no Pedal edema Data Reviewed:  K 5.4  Family Communication: None  Disposition: Status is: Inpatient Remains inpatient appropriate because: respi failure mgmt  Planned Discharge Destination: Home with Home Health   DVT prophylaxis - Eliquis Time spent: 35 minutes  Author: Max Sane, MD 12/19/2022 7:59 PM  For on call review [email or web address].

## 2022-12-19 NOTE — Consult Note (Signed)
NAME:  Kenneth Benson, MRN:  [id number], DOB:  Jun 02, 1961, LOS: 3  CHIEF COMPLAINT:  SOB  BRIEF SYNOPSIS +SMOKER, COPD, AFIB WITH RVR  History of Present Illness:   62 y.o. male with medical history significant of asthma, COPD, ongoing tobacco abuse, atrial fibrillation came in for right-sided abdominal pain.    While he was at work he started having tachycardia shortness of breath and difficulty breathing.    Decided to come to the emergency department while in the ED he underwent CT abdomen as CDP was concerned about mesenteric ischemia but was ruled out.    It did show large bilateral inguinal hernia.   He does have mediastinal lymphadenopathy as well.    He was found to be in rapid A-fib/flutter with a heart rate in 150 and was given Cardizem bolus and started on Cardizem drip.  He also became hypoxic with oxygen saturations in mid 80s requiring BiPAP while in the ED.  Chest x-ray was showing pulmonary edema and CHF pattern so was given Lasix.    PCCM consulted for bIPAP, last night patient was weaned off BiPAP and eating dinner, no major complaints last night 1/19.  Patient was seen this AM, after recommendation of bIPAP for presumed OSA This AM, patient is more SOB, creatinine worsening, on Cardizem infusion   Significant Hospital Events: Including procedures, antibiotic start and stop dates in addition to other pertinent events   1/19 admitted for acute afib with RVR 1/20 wheezing and COPD exacerbation 1/21 tolerated nocturnal biPAP, wheezing resolved 1/22 NAD, minimal oxygen elevated K     Micro Data:  COVID/FLU/RSV NEG  Antimicrobials:   Antibiotics Given (last 72 hours)     Date/Time Action Medication Dose Rate   12/16/22 1418 New Bag/Given   piperacillin-tazobactam (ZOSYN) IVPB 3.375 g 3.375 g 100 mL/hr   12/16/22 1456 New Bag/Given   vancomycin (VANCOREADY) IVPB 2000 mg/400 mL 2,000 mg 200 mL/hr        Interim History / Subjective:  Alert and  awake LESS SOB WHEEZING RESOLVED NO SIGNIFICANT RESP DISTRESS Elevated K s/p Lokelma therapy      Objective   Blood pressure 117/87, pulse (!) 50, temperature 98.7 F (37.1 C), resp. rate (!) 26, height 6\' 1"  (1.854 m), weight 126.3 kg, SpO2 92 %.    FiO2 (%):  [60 %] 60 %   Intake/Output Summary (Last 24 hours) at 12/19/2022 0747 Last data filed at 12/19/2022 0200 Gross per 24 hour  Intake 758.69 ml  Output 850 ml  Net -91.31 ml    Filed Weights   12/17/22 1746 12/18/22 1010  Weight: 99.8 kg 126.3 kg         Review of Systems: Gen:  Denies  fever, sweats, chills weight loss  HEENT: Denies blurred vision, double vision, ear pain, eye pain, hearing loss, nose bleeds, sore throat Cardiac:  No dizziness, chest pain or heaviness, chest tightness,edema, No JVD Resp:   No cough, -sputum production, +shortness of breath,-wheezing, -hemoptysis,  Other:  All other systems negative    Physical Examination:   General Appearance: No distress  EYES PERRLA, EOM intact.   NECK Supple, No JVD Pulmonary: normal breath sounds, No wheezing.  CardiovascularNormal S1,S2.  No m/r/g.   Abdomen: Benign, Soft, non-tender. ALL OTHER ROS ARE NEGATIVE      Labs/imaging that I havepersonally reviewed  (right click and "Reselect all SmartList Selections" daily)      ASSESSMENT AND PLAN SYNOPSIS  62 yo obese white male  with acute SOB due to acute Afib with RVR and acute COPD exacerbation  CARDIAC FAILURE due to AFib with RVR on Cardizem infusion -oxygen as needed -Lasix as tolerated -follow up cardiac enzymes as indicated Follow up cardiology recs  COPD/presumed OSA bIPAP as needed Oxygen as needed   COPD EXACERBATION -continue IV steroids as prescribed -continue NEB THERAPY as prescribed   CARDIAC ICU monitoring  ACUTE KIDNEY INJURY/Renal Failure -continue Foley Catheter-assess need -Avoid nephrotoxic agents -Follow urine output, BMP -Ensure adequate renal  perfusion, optimize oxygenation -Renal dose medications Follow up nephrology recs   Intake/Output Summary (Last 24 hours) at 12/19/2022 0749 Last data filed at 12/19/2022 0200 Gross per 24 hour  Intake 758.69 ml  Output 850 ml  Net -91.31 ml        Latest Ref Rng & Units 12/19/2022    4:31 AM 12/19/2022   12:30 AM 12/18/2022    8:14 PM  BMP  Glucose 70 - 99 mg/dL 154     BUN 6 - 20 mg/dL 61     Creatinine 0.61 - 1.24 mg/dL 1.74     Sodium 135 - 145 mmol/L 131     Potassium 3.5 - 5.1 mmol/L 5.8  5.6  5.5   Chloride 98 - 111 mmol/L 95     CO2 22 - 32 mmol/L 29     Calcium 8.9 - 10.3 mg/dL 8.0     Follow up nephrology recs   ENDO - ICU hypoglycemic\Hyperglycemia protocol -check FSBS per protocol   GI GI PROPHYLAXIS as indicated  NUTRITIONAL STATUS DIET--> as tolerated Constipation protocol as indicated   ELECTROLYTES -follow labs as needed -replace as needed -pharmacy consultation and following    Labs   CBC: Recent Labs  Lab 12/16/22 1305 12/17/22 0615 12/18/22 0520 12/19/22 0431  WBC 8.4 10.3 10.9* 9.4  NEUTROABS 6.8  --   --   --   HGB 13.7 14.1 13.8 14.2  HCT 44.2 47.5 45.6 46.6  MCV 95.1 99.0 97.2 97.1  PLT 178 199 189 201     Basic Metabolic Panel: Recent Labs  Lab 12/17/22 0615 12/17/22 1510 12/18/22 0520 12/18/22 0844 12/18/22 1149 12/18/22 2014 12/19/22 0030 12/19/22 0431  NA 136 132* 132* 131*  --   --   --  131*  K 5.9* 5.9* 6.0* 5.8* 5.7* 5.5* 5.6* 5.8*  CL 102 97* 97* 98  --   --   --  95*  CO2 28 28 27 26   --   --   --  29  GLUCOSE 189* 171* 154* 146* 114*  --   --  154*  BUN 30* 34* 47* 48*  --   --   --  61*  CREATININE 1.56* 1.57* 1.83* 1.82*  --   --   --  1.74*  CALCIUM 7.7* 7.4* 7.7* 7.5*  --   --   --  8.0*  MG 2.2  --   --   --   --   --   --   --     GFR: Estimated Creatinine Clearance: 62.9 mL/min (A) (by C-G formula based on SCr of 1.74 mg/dL (H)). Recent Labs  Lab 12/16/22 1305 12/17/22 0615  12/18/22 0520 12/19/22 0431  WBC 8.4 10.3 10.9* 9.4  LATICACIDVEN 1.3  --   --   --      Liver Function Tests: Recent Labs  Lab 12/16/22 1305  AST 22  ALT 29  ALKPHOS 44  BILITOT 1.3*  PROT  7.0  ALBUMIN 3.8    Recent Labs  Lab 12/16/22 1305  LIPASE 30    No results for input(s): "AMMONIA" in the last 168 hours.  ABG    Component Value Date/Time   PHART 7.51 (H) 02/04/2022 0500   PCO2ART 55 (H) 02/04/2022 0500   PO2ART 59 (L) 02/04/2022 0500   HCO3 33.1 (H) 12/19/2022 0432   O2SAT PENDING 12/19/2022 0432      HbA1C: Hgb A1c MFr Bld  Date/Time Value Ref Range Status  01/21/2022 02:36 PM 4.9 4.8 - 5.6 % Final    Comment:    (NOTE) Pre diabetes:          5.7%-6.4%  Diabetes:              >6.4%  Glycemic control for   <7.0% adults with diabetes     Home Medications  Prior to Admission medications   Medication Sig Start Date End Date Taking? Authorizing Provider  albuterol (PROVENTIL HFA) 108 (90 Base) MCG/ACT inhaler Inhale 2 puffs into the lungs every 6 (six) hours as needed for wheezing. 02/11/22  Yes Lewie Chamber, MD  apixaban (ELIQUIS) 5 MG TABS tablet Take 1 tablet (5 mg total) by mouth 2 (two) times daily. 02/11/22 06/18/22  Lewie Chamber, MD  metoprolol tartrate (LOPRESSOR) 25 MG tablet Take 1 tablet (25 mg total) by mouth 2 (two) times daily. Patient not taking: Reported on 12/16/2022 02/11/22   Lewie Chamber, MD  predniSONE (DELTASONE) 5 MG tablet Take 3 tablets once in the morning on Day 1, then 2 tablets once in the morning on Day 2, then 1 tablet in the morning on day 3. Patient not taking: Reported on 12/16/2022 02/11/22   Lewie Chamber, MD  amiodarone (PACERONE) 200 MG tablet Take 1 tablet (200 mg total) by mouth 2 (two) times daily. 02/11/22 03/15/22  Lewie Chamber, MD       Lucie Leather, M.D.  [facility]  Medical Director [facility] Medical Director [facility] Cardio-Pulmonary Department

## 2022-12-19 NOTE — Assessment & Plan Note (Signed)
Likely due to COPD exacerbation and some fluid overload, received bolus of fluid in the ED. initially required BiPAP so was admitted for stepdown but later weaned off BiPAP to high flow nasal cannula -transfer out of stepdown to PCU.  PCCM signed off

## 2022-12-19 NOTE — Assessment & Plan Note (Signed)
Followed by nephrology.  On Lokelma twice daily K 5.4

## 2022-12-19 NOTE — Evaluation (Addendum)
Occupational Therapy Evaluation Patient Details Name: Kenneth Benson MRN: [id number] DOB: June 08, 1961 Today's Date: 12/19/2022   History of Present Illness 62 y.o.  male with , who was admitted to [facility] on 12/16/2022 for Atrial flutter ([place]) (I48.92) . Patient has a history of asthma, COPD, tobacco abuse who originally presented for right sided abdominal pain was found to be in rapid A-fib/flutter.   Clinical Impression   Patient presenting with decreased Ind in self care,balance, functional mobility/transfers, endurance, and safety awareness. Patient reports being Ind at baseline and living at home with girlfriend. 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 He does not endorse use of oxygen or AD at baseline. Pt does have electricity but does not have running water at home and uses wipes for bathing at this time. Patient currently functioning at mod I for bed mobility and stands initially with B knee buckle but catches self and takes several side steps along EOB without further buckling with min guard progressing to close supervision without use of AD. Pt on 8Ls via HFNC and remains at or above mid 80's with mobility.  Patient will benefit from acute OT to increase overall independence in the areas of ADLs, functional mobility, and safety awareness in order to safely discharge home with family.      Recommendations for follow up therapy are one component of a multi-disciplinary discharge planning process, led by the attending physician.  Recommendations may be updated based on patient status, additional functional criteria and insurance authorization.   Follow Up Recommendations  No OT follow up     Assistance Recommended at Discharge Intermittent Supervision/Assistance  Patient can return home with the following A little help with walking and/or transfers;A little help with bathing/dressing/bathroom;Help with stairs or ramp for  entrance;Assist for transportation;Assistance with cooking/housework    Functional Status Assessment  Patient has had a recent decline in their functional status and demonstrates the ability to make significant improvements in function in a reasonable and predictable amount of time.  Equipment Recommendations  None recommended by OT       Precautions / Restrictions Precautions Precautions: Fall      Mobility Bed Mobility Overal bed mobility: Modified Independent             General bed mobility comments: HOB elevated but no physical assistance    Transfers Overall transfer level: Needs assistance Equipment used: None, 1 person hand held assist Transfers: Sit to/from Stand, Bed to chair/wheelchair/BSC Sit to Stand: Supervision     Step pivot transfers: Min guard            Balance Overall balance assessment: Needs assistance Sitting-balance support: Feet supported Sitting balance-Leahy Scale: Good     Standing balance support: During functional activity, No upper extremity supported Standing balance-Leahy Scale: Fair                             ADL either performed or assessed with clinical judgement   ADL Overall ADL's : Needs assistance/impaired                     Lower Body Dressing: Minimal assistance;Sit to/from stand   Toilet Transfer: Magazine features editor Details (indicate cue type and reason): simulated                 Vision Patient Visual Report: No change from baseline  Pertinent Vitals/Pain Pain Assessment Pain Assessment: No/denies pain     Hand Dominance Right   Extremity/Trunk Assessment Upper Extremity Assessment Upper Extremity Assessment: Overall WFL for tasks assessed   Lower Extremity Assessment Lower Extremity Assessment: Generalized weakness;Overall WFL for tasks assessed       Communication Communication Communication: No difficulties   Cognition Arousal/Alertness:  Awake/alert Behavior During Therapy: WFL for tasks assessed/performed Overall Cognitive Status: Within Functional Limits for tasks assessed                                 General Comments: Pt is pleasant and agreeable                Home Living Family/patient expects to be discharged to:: Private residence Living Arrangements: Spouse/significant other;Non-relatives/Friends Available Help at Discharge: Family;Friend(s);Other (Comment) (gf available 24/7 per pt) Type of Home: Mobile home Home Access: Stairs to enter Entrance Stairs-Number of Steps: 5 Entrance Stairs-Rails: Left;Right;Can reach both Home Layout: One level     Bathroom Shower/Tub: Teacher, early years/pre: Standard                Prior Functioning/Environment Prior Level of Function : Independent/Modified Independent;Working/employed               ADLs Comments: Pt reports he is Ind in self care and IADLs. He was working in Architect until coming into the hospital. Lives with gf, [name], and they share IADL tasks. She drives him to work.        OT Problem List: Decreased strength;Decreased activity tolerance;Impaired balance (sitting and/or standing);Decreased safety awareness      OT Treatment/Interventions: Self-care/ADL training;Therapeutic exercise;Balance training;Patient/family education    OT Goals(Current goals can be found in the care plan section) Acute Rehab OT Goals Patient Stated Goal: to return home and work OT Goal Formulation: With patient Time For Goal Achievement: 01/02/23 Potential to Achieve Goals: Fair ADL Goals Pt Will Perform Grooming: with modified independence Pt Will Perform Lower Body Dressing: with modified independence Pt Will Transfer to Toilet: with modified independence Pt Will Perform Toileting - Clothing Manipulation and hygiene: with modified independence  OT Frequency: Min 2X/week       AM-PAC OT "6 Clicks" Daily Activity      Outcome Measure Help from another person eating meals?: None Help from another person taking care of personal grooming?: None Help from another person toileting, which includes using toliet, bedpan, or urinal?: A Little Help from another person bathing (including washing, rinsing, drying)?: A Little Help from another person to put on and taking off regular upper body clothing?: None Help from another person to put on and taking off regular lower body clothing?: A Little 6 Click Score: 21   End of Session Nurse Communication: Mobility status  Activity Tolerance: Patient tolerated treatment well Patient left: in bed;with call bell/phone within reach;with bed alarm set  OT Visit Diagnosis: Unsteadiness on feet (R26.81);Muscle weakness (generalized) (M62.81)                Time: 5732-2025 OT Time Calculation (min): 20 min Charges:  OT General Charges $OT Visit: 1 Visit OT Evaluation $OT Eval Moderate Complexity: 1 Mod OT Treatments $Therapeutic Activity: 8-22 mins Darleen Crocker, MS, OTR/L , CBIS ascom [phone]  12/19/22, 3:10 PM

## 2022-12-19 NOTE — Assessment & Plan Note (Signed)
He was first diagnosed with A-fib in February 2023 after a COPD flareup.  He had follow-up with Dr. Donnelly Angelica at [facility] in April 2023 -He had lost follow-up since then. Continue oral metoprolol and Cardizem for rate control Continue Eliquis 5 mg p.o. twice daily -2D echo shows EF 45-50% Cardio seen

## 2022-12-20 ENCOUNTER — Other Ambulatory Visit (HOSPITAL_COMMUNITY): Payer: Self-pay

## 2022-12-20 DIAGNOSIS — J441 Chronic obstructive pulmonary disease with (acute) exacerbation: Secondary | ICD-10-CM | POA: Diagnosis not present

## 2022-12-20 DIAGNOSIS — J9601 Acute respiratory failure with hypoxia: Secondary | ICD-10-CM | POA: Diagnosis not present

## 2022-12-20 DIAGNOSIS — I5033 Acute on chronic diastolic (congestive) heart failure: Secondary | ICD-10-CM | POA: Diagnosis not present

## 2022-12-20 DIAGNOSIS — I4891 Unspecified atrial fibrillation: Secondary | ICD-10-CM | POA: Diagnosis not present

## 2022-12-20 LAB — CBC
HCT: 42.5 % (ref 39.0–52.0)
Hemoglobin: 13.3 g/dL (ref 13.0–17.0)
MCH: 29.6 pg (ref 26.0–34.0)
MCHC: 31.3 g/dL (ref 30.0–36.0)
MCV: 94.4 fL (ref 80.0–100.0)
Platelets: 206 10*3/uL (ref 150–400)
RBC: 4.5 MIL/uL (ref 4.22–5.81)
RDW: 15.8 % — ABNORMAL HIGH (ref 11.5–15.5)
WBC: 9.2 10*3/uL (ref 4.0–10.5)
nRBC: 0 % (ref 0.0–0.2)

## 2022-12-20 LAB — POTASSIUM
Potassium: 4.6 mmol/L (ref 3.5–5.1)
Potassium: 4.6 mmol/L (ref 3.5–5.1)
Potassium: 4.6 mmol/L (ref 3.5–5.1)
Potassium: 5 mmol/L (ref 3.5–5.1)

## 2022-12-20 LAB — BLOOD GAS, VENOUS
Acid-Base Excess: 3.9 mmol/L — ABNORMAL HIGH (ref 0.0–2.0)
Bicarbonate: 33.1 mmol/L — ABNORMAL HIGH (ref 20.0–28.0)
Patient temperature: 37
pH, Ven: 7.27 (ref 7.25–7.43)
pO2, Ven: 40 mmHg (ref 32–45)

## 2022-12-20 LAB — BASIC METABOLIC PANEL
Anion gap: 6 (ref 5–15)
BUN: 58 mg/dL — ABNORMAL HIGH (ref 6–20)
CO2: 30 mmol/L (ref 22–32)
Calcium: 8.3 mg/dL — ABNORMAL LOW (ref 8.9–10.3)
Chloride: 98 mmol/L (ref 98–111)
Creatinine, Ser: 1.12 mg/dL (ref 0.61–1.24)
GFR, Estimated: >60 mL/min (ref >60)
Glucose, Bld: 127 mg/dL — ABNORMAL HIGH (ref 70–99)
Potassium: 4.8 mmol/L (ref 3.5–5.1)
Sodium: 134 mmol/L — ABNORMAL LOW (ref 135–145)

## 2022-12-20 MED ORDER — DILTIAZEM HCL ER COATED BEADS 180 MG PO CP24: 180.0000 mg | ORAL_CAPSULE | Freq: Every day | ORAL | Status: DC

## 2022-12-20 MED ORDER — BUDESONIDE 0.5 MG/2ML IN SUSP
0.5000 mg | Freq: Two times a day (BID) | RESPIRATORY_TRACT | Status: DC
  Administered 2022-12-20 – 2022-12-24 (×8): 0.5 mg via RESPIRATORY_TRACT
  Filled 2022-12-20 (×8): qty 2

## 2022-12-20 MED ORDER — DILTIAZEM HCL 30 MG PO TABS
30.0000 mg | ORAL_TABLET | Freq: Four times a day (QID) | ORAL | Status: AC
  Administered 2022-12-20: 30 mg via ORAL
  Filled 2022-12-20: qty 1

## 2022-12-20 MED ORDER — IPRATROPIUM-ALBUTEROL 0.5-2.5 (3) MG/3ML IN SOLN
3.0000 mL | RESPIRATORY_TRACT | Status: DC
  Administered 2022-12-20 – 2022-12-21 (×4): 3 mL via RESPIRATORY_TRACT
  Filled 2022-12-20 (×4): qty 3

## 2022-12-20 MED ORDER — SODIUM ZIRCONIUM CYCLOSILICATE 10 G PO PACK
10.0000 g | PACK | Freq: Every day | ORAL | Status: DC
  Administered 2022-12-20: 10 g via ORAL
  Filled 2022-12-20 (×2): qty 1

## 2022-12-20 NOTE — TOC Benefit Eligibility Note (Signed)
Patient Advocate Encounter  Insurance verification completed.    The patient is currently admitted and upon discharge could be taking Eliquis 5 mg.  The current 30 day co-pay is $4.00.   The patient is insured through [employer]   Lillia Lengel, CPHT Pharmacy Patient Advocate Specialist [facility] Patient Advocate Team Direct Number: [phone]  Fax: [phone]       

## 2022-12-20 NOTE — Progress Notes (Signed)
   12/20/22 2008  Assess: MEWS Score  Temp 98.2 F (36.8 C)  BP 119/78  MAP (mmHg) 92  Pulse Rate (!) 137  ECG Heart Rate (!) 137  Resp 20  SpO2 96 %  O2 Device HFNC  Assess: MEWS Score  MEWS Temp 0  MEWS Systolic 0  MEWS Pulse 3  MEWS RR 0  MEWS LOC 0  MEWS Score 3  MEWS Score Color Yellow  Assess: if the MEWS score is Yellow or Red  Were vital signs taken at a resting state? Yes  Focused Assessment No change from prior assessment  Does the patient meet 2 or more of the SIRS criteria? No  MEWS guidelines implemented *See Row Information* No, previously yellow, continue vital signs every 4 hours  Treat  MEWS Interventions Administered scheduled meds/treatments  Pain Scale 0-10  Pain Score 0  Take Vital Signs  Increase Vital Sign Frequency  Yellow: Q 2hr X 2 then Q 4hr X 2, if remains yellow, continue Q 4hrs  Notify: Charge Nurse/RN  Name of Charge Nurse/RN Notified Edvin Albus  Date Charge Nurse/RN Notified 12/20/22  Time Charge Nurse/RN Notified 2008  Provider Notification  Provider Name/Title Neomia Glass NP  Date Provider Notified 12/20/22  Time Provider Notified 2011  Method of Notification Page  Notification Reason Other (Comment) (yellow mews)  Provider response No new orders;Other (Comment) (ordered to adminster metropolol early)  Date of Provider Response 12/20/22  Time of Provider Response 2029  Assess: SIRS CRITERIA  SIRS Temperature  0  SIRS Pulse 1  SIRS Respirations  0  SIRS WBC 0  SIRS Score Sum  1

## 2022-12-20 NOTE — TOC Initial Note (Addendum)
Transition of Care [facility]) - Initial/Assessment Note    Patient Details  Name: Kenneth Benson MRN: [id number] Date of Birth: 03/26/1961  Transition of Care [facility]) CM/SW Contact:    Laurena Slimmer, RN Phone Number: 12/20/2022, 11:03 AM  Clinical Narrative:                  Spoke with patient regarding needed resources for transportation and utilities. Resources for transportation, and utilities added to AVS. Patient advised to contact DSS for more information on his assigned PCP via Medicaid. His friend will transport him home at discharge.         Patient Goals and CMS Choice            Expected Discharge Plan and Services                                              Prior Living Arrangements/Services                       Activities of Daily Living Home Assistive Devices/Equipment: None ADL Screening (condition at time of admission) Patient's cognitive ability adequate to safely complete daily activities?: Yes Is the patient deaf or have difficulty hearing?: No Does the patient have difficulty seeing, even when wearing glasses/contacts?: No Does the patient have difficulty concentrating, remembering, or making decisions?: No Patient able to express need for assistance with ADLs?: Yes Does the patient have difficulty dressing or bathing?: No Independently performs ADLs?: Yes (appropriate for developmental age) Does the patient have difficulty walking or climbing stairs?: No Weakness of Legs: None Weakness of Arms/Hands: None  Permission Sought/Granted                  Emotional Assessment              Admission diagnosis:  Atrial flutter ([place]) [I48.92] Abdominal pain, acute [R10.9] Atrial fibrillation with rapid ventricular response ([place]) [I48.91] COPD with acute exacerbation ([place]) [J44.1] Patient Active Problem List   Diagnosis Date Noted   AKI (acute kidney injury) ([place]) 12/17/2022   Hyperkalemia 12/17/2022   Atrial fibrillation  with RVR ([place]) 12/16/2022   Acute on chronic diastolic CHF (congestive heart failure) ([place]) 12/16/2022   Physical deconditioning 02/07/2022   Overweight (BMI 25.0-29.9) 02/06/2022   A-fib ([place]) 02/06/2022   Dysphagia 02/06/2022   Arterial line in place    COPD exacerbation ([place]) 01/21/2022   Asthma exacerbation 01/21/2022   Tobacco abuse 01/21/2022   Acute hypoxic respiratory failure ([place])    PCP:  Pcp, No Pharmacy:   [facility] [address]), [place] [address] [place]) [place] [address] Phone: [phone] Fax: [place] [address] Phone: [phone] Fax: [phone]     Social Determinants of Health (SDOH) Social History: SDOH Screenings   Food Insecurity: No Food Insecurity (12/18/2022)  Housing: Low Risk  (12/18/2022)  Transportation Needs: Unmet Transportation Needs (12/18/2022)  Utilities: Not At Risk (12/18/2022)  Tobacco Use: High Risk (12/18/2022)   SDOH Interventions:     Readmission Risk Interventions     No data to display

## 2022-12-20 NOTE — Progress Notes (Signed)
[facility] Cardiology  Patient ID: [name] MRN: [id number] DOB/AGE: Apr 07, 1961 62 y.o.   Admit date: 12/16/2022 Referring Physician Manuella Ghazi Primary Physician none Primary Cardiologist Corky Sox Reason for Consultation atrial fibrillation  HPI: Kenneth Benson is a 62 YOM with a PMH of asthma, COPD, ongoing tobacco use, paroxysmal AF on Eliquis who presented to [facility] ED 12/16/2022 with a chief complaint of right-sided abdominal pain.  There was concern for mesenteric ischemia however CT was unremarkable with exception of large bilateral inguinal hernia.  The patient was noted to be in atrial fibrillation with rapid ventricular rate at 153 bpm.  Patient was treated with Eliquis, metoprolol tartrate 25 mg twice daily, and Cardizem bolus and infusion.  Initially required BiPAP, now weaned to [facility] and is being treated for a COPD exacerbation.  Nephrology also following for AKI and hyperkalemia.  Interval history: -States he feels good and does not feel short of breath, although still requiring 8 L by HFNC -No chest pain, heart racing, still with trace-1+ peripheral edema -Discussed echo results with the patient, revealed EF 45-50% -Remains in atrial fibrillation, although rate better controlled in the 90s-low 100s today   Vitals:   12/20/22 0512 12/20/22 0758 12/20/22 0957 12/20/22 1212  BP: 109/86 103/83  107/69  Pulse: (!) 112 (!) 43 77 99  Resp: 18 20  16   Temp: 97.9 F (36.6 C) 98.5 F (36.9 C)  97.8 F (36.6 C)  TempSrc:  Oral    SpO2: 96% 93%  93%  Weight:      Height:         Intake/Output Summary (Last 24 hours) at 12/20/2022 1342 Last data filed at 12/20/2022 1209 Gross per 24 hour  Intake --  Output 1000 ml  Net -1000 ml      PHYSICAL EXAM  General: ill appearing caucasian male, sitting upright in recliner in PCU  HEENT:  Normocephalic and atraumatic Neck:  No JVD.  Lungs: slight conversational dyspnea on HFNC, decreased breathdiffuse expiratory wheezing  Heart: irregularly  irregular with mostly controlled rate . Normal S1 and S2 without gallops or murmurs.  Abdomen: nondistended appearing with excess adiposity  Msk:   Normal strength and tone for age. Extremities: No clubbing, cyanosis. Trace bilateral  edema.   Neuro: Alert and oriented X 3. Psych:  Good affect, responds appropriately   LABS: Basic Metabolic Panel: Recent Labs    12/19/22 0431 12/19/22 0844 12/20/22 0343 12/20/22 0808 12/20/22 1223  NA 131*  --  134*  --   --   K 5.8*   < > 4.8 4.6 4.6  CL 95*  --  98  --   --   CO2 29  --  30  --   --   GLUCOSE 154*  --  127*  --   --   BUN 61*  --  58*  --   --   CREATININE 1.74*  --  1.12  --   --   CALCIUM 8.0*  --  8.3*  --   --    < > = values in this interval not displayed.   Liver Function Tests: No results for input(s): "AST", "ALT", "ALKPHOS", "BILITOT", "PROT", "ALBUMIN" in the last 72 hours.  No results for input(s): "LIPASE", "AMYLASE" in the last 72 hours.  CBC: Recent Labs    12/19/22 0431 12/20/22 0343  WBC 9.4 9.2  HGB 14.2 13.3  HCT 46.6 42.5  MCV 97.1 94.4  PLT 201 206   Cardiac Enzymes:  No results for input(s): "CKTOTAL", "CKMB", "CKMBINDEX", "TROPONINI" in the last 72 hours. BNP: Invalid input(s): "POCBNP" D-Dimer: No results for input(s): "DDIMER" in the last 72 hours. Hemoglobin A1C: No results for input(s): "HGBA1C" in the last 72 hours. Fasting Lipid Panel: No results for input(s): "CHOL", "HDL", "LDLCALC", "TRIG", "CHOLHDL", "LDLDIRECT" in the last 72 hours.  Thyroid Function Tests: No results for input(s): "TSH", "T4TOTAL", "T3FREE", "THYROIDAB" in the last 72 hours.  Invalid input(s): "FREET3"  Anemia Panel: No results for input(s): "VITAMINB12", "FOLATE", "FERRITIN", "TIBC", "IRON", "RETICCTPCT" in the last 72 hours.  No results found.   Echo 01/29/2022: EF 50-55%, trivial MR  TELEMETRY: Fibrillation at 122 bpm:  ASSESSMENT AND PLAN:  Principal Problem:   Atrial fibrillation with RVR  (HCC) Active Problems:   COPD exacerbation (HCC)   Acute hypoxic respiratory failure (HCC)   Overweight (BMI 25.0-29.9)   Acute on chronic diastolic CHF (congestive heart failure) (HCC)   AKI (acute kidney injury) (HCC)   Hyperkalemia   1. Atrial fibrillation with rapid ventricular rate, started on Eliquis for stroke prevention, and metoprolol  tartrate and Cardizem bolus and infusion for rate and rhythm control.  Cardizem infusion transition to diltiazem 60 mg p.o. every 6 hours.  Heart rate remains elevated though patient currently asymptomatic. 2.  Acute hypoxic respiratory failure, secondary to COPD exacerbation 3.  new HFmrEF , chest x-ray revealing pulmonary interstitial edema, echo 12/18/2022 revealed EF 45-50% without RWMA's, moderate MR, moderate TR 4.   AKI, felt to be from IV contrast vs overdiuresis, nephrology following 5.  Hyperkalemia, s/p lokelma  6.  Tobacco abuse   Recommendations   1.  Agree with current therapy 2.  Continue Eliquis for stroke prevention 3.  consolidate diltiazem from 60 mg p.o. every 6 hours to cardizem 180mg  CD starting tomorrow AM  4.  Continue metoprolol tartrate 50 mg twice daily 5.  initiation of GDMT limited by hyperkalemia (MRA, ARNi/ARB), patient is hesitant to add additional medications as well (SGT2i) 6.  Strongly encouraged complete smoking cessation. He is motivated to quit  This patient's plan of care was discussed and created with Dr. Saralyn Pilar and he is in agreement.    Tristan Schroeder, PA-C 12/20/2022 1:42 PM

## 2022-12-20 NOTE — Progress Notes (Signed)
Progress Note   Patient: Kenneth Benson:[id number] DOB: 22-Nov-1961 DOA: 12/16/2022     4 DOS: the patient was seen and examined on 12/20/2022   Brief hospital course: 62 year old male with a known history of COPD, asthma, ongoing tobacco abuse, A-fib is admitted for rapid A-fib/flutter and acute hypoxic respiratory failure requiring BiPAP  1/19: cardiology, intensivist consult 1/20: On oral Cardizem and metoprolol for rate control.  Off BiPAP 1/21: Nephrology consult for AKI and hyperkalemia, transferred to stepdown for close monitoring 1/22: transfer to PCU. K improving (still high), on HFNC. 1/23: Potassium normalized.  Still on 8 L HFNC  Assessment and Plan: * Atrial fibrillation with RVR ([place]) He was first diagnosed with A-fib in February 2023 after a COPD flareup.  He had follow-up with Dr. Donnelly Benson at [facility] in April 2023 -He had lost follow-up since then. Continue oral metoprolol and Cardizem for rate control Cardiology starting Cardizem CD from tomorrow Continue Eliquis 5 mg p.o. twice daily -2D echo shows EF 45-50% Cardio following   Acute hypoxic respiratory failure (HCC) Likely due to COPD exacerbation and some fluid overload, received bolus of fluid in the ED. initially required BiPAP so was admitted for stepdown but later weaned off BiPAP to high flow nasal cannula -transfer out of stepdown to PCU.  PCCM signed off.  Currently on 8 L HFNC.  Wean as able    COPD exacerbation (HCC) Continue prednisone 20 mg daily Weaned off BiPAP to high flow nasal cannula 8 L Continue inhalers and nebulizers COVID and influenza negative PCCM signed off.  Overweight (BMI 25.0-29.9) Complicates overall prognosis.  Hyperkalemia Now resolved.  Required several doses of Lokelma.  Followed by nephrology  AKI (acute kidney injury) ([place]) Likely multifactorial from possible ATN due to hypotension, contrast-induced nephropathy, overdiuresis with Lasix.  AKI has resolved now .   Hold Lasix for now.  Nephrology following  Lab Results  Component Value Date   CREATININE 1.12 12/20/2022   CREATININE 1.74 (H) 12/19/2022   CREATININE 1.82 (H) 12/18/2022     Acute on chronic diastolic CHF (congestive heart failure) (HCC) Initial Chest x-ray showing cardiomegaly with pulmonary interstitial edema and likely trace effusions. Findings may reflect CHF.   Holding Lasix as his kidney function worsened Strict I's and O's Daily weight 2D echo showed EF 45 to 50% Cardiology following Net IO Since Admission: -2,854.33 mL [12/20/22 1714]           Subjective: Sitting in the chair.  Working with therapy, feeling somewhat better.  Less short of breath  Physical Exam: Vitals:   12/20/22 0758 12/20/22 0957 12/20/22 1212 12/20/22 1600  BP: 103/83  107/69 (!) 119/93  Pulse: (!) 43 77 99 (!) 55  Resp: 20  16 20   Temp: 98.5 F (36.9 C)  97.8 F (36.6 C) 98.2 F (36.8 C)  TempSrc: Oral     SpO2: 93%  93% 96%  Weight:      Height:       62 year old disheveled looking man sitting in the chair in no respiratory distress Lungs decreased breath sounds at the bases, no wheezing, rales or rhonchi Cardiovascular irregularly irregular heart rate, Abdomen soft, obese, benign Neuro alert and awake, nonfocal Psych normal mood and affect Skin no obvious rash or lesion Extremities:no Pedal edema Data Reviewed:  There are no new results to review at this time.  Family Communication: Updated friend/Kenneth Benson over phone  Disposition: Status is: Inpatient Remains inpatient appropriate because: Management of respiratory failure  Planned Discharge Destination: Home   DVT prophylaxis-Eliquis Time spent: 35 minutes  Author: Max Sane, MD 12/20/2022 5:17 PM  For on call review [email or web address].

## 2022-12-20 NOTE — Assessment & Plan Note (Signed)
Now resolved.  Required several doses of Lokelma.  Followed by nephrology

## 2022-12-20 NOTE — Assessment & Plan Note (Signed)
Initial Chest x-ray showing cardiomegaly with pulmonary interstitial edema and likely trace effusions. Findings may reflect CHF.   Holding Lasix as his kidney function worsened Strict I's and O's Daily weight 2D echo showed EF 45 to 50% Cardiology following Net IO Since Admission: -2,854.33 mL [12/20/22 1714]

## 2022-12-20 NOTE — Progress Notes (Signed)
[facility]  ROUNDING NOTE   Subjective:   [name] is a 62 y.o.  male with , who was admitted to [facility] on 12/16/2022 for Atrial flutter (HCC) [I48.92] . Patient has a history of asthma, COPD, tobacco abuse who originally presented for right sided abdominal pain was found to be in rapid A-fib/flutter.    Patient seen sitting up in bed Alert and oriented Appetite remains appropriate Denies nausea and vomiting  Objective:  Vital signs in last 24 hours:  Temp:  [97.6 F (36.4 C)-98.5 F (36.9 C)] 98.5 F (36.9 C) (01/23 0758) Pulse Rate:  [43-112] 77 (01/23 0957) Resp:  [18-34] 20 (01/23 0758) BP: (92-131)/(71-93) 103/83 (01/23 0758) SpO2:  [89 %-96 %] 93 % (01/23 0758) FiO2 (%):  [60 %] 60 % (01/22 2133)  Weight change:  Filed Weights   12/17/22 1746 12/18/22 1010  Weight: 99.8 kg 126.3 kg    Intake/Output: I/O last 3 completed shifts: In: 240 [P.O.:240] Out: 1600 [Urine:1600]   Intake/Output this shift:  No intake/output data recorded.  Physical Exam: General: NAD  Head: Normocephalic, atraumatic. Moist oral mucosal membranes  Eyes: Anicteric  Lungs:  Clear to auscultation, normal effort, [place]  Heart: Regular rate and rhythm  Abdomen:  Soft, nontender  Extremities: No peripheral edema.  Neurologic: Nonfocal, moving all four extremities  Skin: No lesions  Access: None     Basic Metabolic Panel: Recent Labs  Lab 12/17/22 0615 12/17/22 1510 12/18/22 0520 12/18/22 0844 12/18/22 1149 12/18/22 2014 12/19/22 0431 12/19/22 0844 12/19/22 1625 12/19/22 2026 12/20/22 0009 12/20/22 0343 12/20/22 0808  NA 136 132* 132* 131*  --   --  131*  --   --   --   --  134*  --   K 5.9* 5.9* 6.0* 5.8* 5.7*   < > 5.8*   < > 5.4* 5.6* 5.0 4.8 4.6  CL 102 97* 97* 98  --   --  95*  --   --   --   --  98  --   CO2 28 28 27 26   --   --  29  --   --   --   --  30  --   GLUCOSE 189* 171* 154* 146* 114*  --  154*  --   --   --   --  127*  --   BUN 30* 34* 47*  48*  --   --  61*  --   --   --   --  58*  --   CREATININE 1.56* 1.57* 1.83* 1.82*  --   --  1.74*  --   --   --   --  1.12  --   CALCIUM 7.7* 7.4* 7.7* 7.5*  --   --  8.0*  --   --   --   --  8.3*  --   MG 2.2  --   --   --   --   --   --   --   --   --   --   --   --    < > = values in this interval not displayed.     Liver Function Tests: Recent Labs  Lab 12/16/22 1305  AST 22  ALT 29  ALKPHOS 44  BILITOT 1.3*  PROT 7.0  ALBUMIN 3.8    Recent Labs  Lab 12/16/22 1305  LIPASE 30    No results for input(s): "AMMONIA" in  the last 168 hours.  CBC: Recent Labs  Lab 12/16/22 1305 12/17/22 0615 12/18/22 0520 12/19/22 0431 12/20/22 0343  WBC 8.4 10.3 10.9* 9.4 9.2  NEUTROABS 6.8  --   --   --   --   HGB 13.7 14.1 13.8 14.2 13.3  HCT 44.2 47.5 45.6 46.6 42.5  MCV 95.1 99.0 97.2 97.1 94.4  PLT 178 199 189 201 206     Cardiac Enzymes: No results for input(s): "CKTOTAL", "CKMB", "CKMBINDEX", "TROPONINI" in the last 168 hours.  BNP: Invalid input(s): "POCBNP"  CBG: Recent Labs  Lab 12/18/22 1008  GLUCAP 155*    Microbiology: Results for orders placed or performed during the hospital encounter of 12/16/22  Resp panel by RT-PCR (RSV, Flu A&B, Covid) Anterior Nasal Swab     Status: None   Collection Time: 12/16/22  1:18 PM   Specimen: Anterior Nasal Swab  Result Value Ref Range Status   SARS Coronavirus 2 by RT PCR NEGATIVE NEGATIVE Final    Comment: (NOTE) SARS-CoV-2 target nucleic acids are NOT DETECTED.  The SARS-CoV-2 RNA is generally detectable in upper respiratory specimens during the acute phase of infection. The lowest concentration of SARS-CoV-2 viral copies this assay can detect is 138 copies/mL. A negative result does not preclude SARS-Cov-2 infection and should not be used as the sole basis for treatment or other patient management decisions. A negative result may occur with  improper specimen collection/handling, submission of specimen  other than nasopharyngeal swab, presence of viral mutation(s) within the areas targeted by this assay, and inadequate number of viral copies(<138 copies/mL). A negative result must be combined with clinical observations, patient history, and epidemiological information. The expected result is Negative.  Fact Sheet for Patients:  BloggerCourse.com  Fact Sheet for Healthcare Providers:  SeriousBroker.it  This test is no t yet approved or cleared by the [place] FDA and  has been authorized for detection and/or diagnosis of SARS-CoV-2 by FDA under an Emergency Use Authorization (EUA). This EUA will remain  in effect (meaning this test can be used) for the duration of the COVID-19 declaration under Section 564(b)(1) of the Act, 21 U.S.C.section 360bbb-3(b)(1), unless the authorization is terminated  or revoked sooner.       Influenza A by PCR NEGATIVE NEGATIVE Final   Influenza B by PCR NEGATIVE NEGATIVE Final    Comment: (NOTE) The Xpert Xpress SARS-CoV-2/FLU/RSV plus assay is intended as an aid in the diagnosis of influenza from Nasopharyngeal swab specimens and should not be used as a sole basis for treatment. Nasal washings and aspirates are unacceptable for Xpert Xpress SARS-CoV-2/FLU/RSV testing.  Fact Sheet for Patients: BloggerCourse.com  Fact Sheet for Healthcare Providers: SeriousBroker.it  This test is not yet approved or cleared by the [place] FDA and has been authorized for detection and/or diagnosis of SARS-CoV-2 by FDA under an Emergency Use Authorization (EUA). This EUA will remain in effect (meaning this test can be used) for the duration of the COVID-19 declaration under Section 564(b)(1) of the Act, 21 U.S.C. section 360bbb-3(b)(1), unless the authorization is terminated or revoked.     Resp Syncytial Virus by PCR NEGATIVE NEGATIVE Final     Comment: (NOTE) Fact Sheet for Patients: BloggerCourse.com  Fact Sheet for Healthcare Providers: SeriousBroker.it  This test is not yet approved or cleared by the [place] FDA and has been authorized for detection and/or diagnosis of SARS-CoV-2 by FDA under an Emergency Use Authorization (EUA). This EUA will remain in effect (meaning this test  can be used) for the duration of the COVID-19 declaration under Section 564(b)(1) of the Act, 21 U.S.C. section 360bbb-3(b)(1), unless the authorization is terminated or revoked.  Performed at [facility], [address] [place], [place] [address]   MRSA Next Gen by PCR, Nasal     Status: None   Collection Time: 12/18/22 10:15 AM   Specimen: Nasal Mucosa; Nasal Swab  Result Value Ref Range Status   MRSA by PCR Next Gen NOT DETECTED NOT DETECTED Final    Comment: (NOTE) The GeneXpert MRSA Assay (FDA approved for NASAL specimens only), is one component of a comprehensive MRSA colonization surveillance program. It is not intended to diagnose MRSA infection nor to guide or monitor treatment for MRSA infections. Test performance is not FDA approved in patients less than 23 years old. Performed at [facility], [address] [place], [place] [address]     Coagulation Studies: No results for input(s): "LABPROT", "INR" in the last 72 hours.  Urinalysis: No results for input(s): "COLORURINE", "LABSPEC", "PHURINE", "GLUCOSEU", "HGBUR", "BILIRUBINUR", "KETONESUR", "PROTEINUR", "UROBILINOGEN", "NITRITE", "LEUKOCYTESUR" in the last 72 hours.  Invalid input(s): "APPERANCEUR"     Imaging: ECHOCARDIOGRAM COMPLETE  Result Date: 12/19/2022    ECHOCARDIOGRAM REPORT   Patient [facility]   [facility] Date of Exam: 12/18/2022 Medical Rec #:  [id number]     Height:       73.0 in Accession #:    [id number]    Weight:       220.0 lb Date of Birth:  1960/12/01    BSA:          2.241  m Patient Age:    62 years      BP:           118/73 mmHg Patient Gender: M             HR:           115 bpm. Exam Location:  [facility] Procedure: 2D Echo, Color Doppler and Cardiac Doppler Indications:     Dyspnea  History:         Patient has prior history of Echocardiogram examinations. COPD;                  Risk Factors:Current Smoker.  Sonographer:     L. Thornton-Maynard Referring Phys:  [id number] [facility] Diagnosing Phys: Isaias Cowman MD IMPRESSIONS  1. Left ventricular ejection fraction, by estimation, is 45 to 50%. The left ventricle has mildly decreased function. The left ventricle has no regional wall motion abnormalities. The left ventricular internal cavity size was mildly dilated. Left ventricular diastolic parameters were normal.  2. Right ventricular systolic function is normal. The right ventricular size is normal. There is mildly elevated pulmonary artery systolic pressure.  3. The mitral valve is normal in structure. Moderate mitral valve regurgitation. No evidence of mitral stenosis.  4. Tricuspid valve regurgitation is moderate.  5. The aortic valve is normal in structure. Aortic valve regurgitation is not visualized. No aortic stenosis is present.  6. The inferior vena cava is normal in size with greater than 50% respiratory variability, suggesting right atrial pressure of 3 mmHg. FINDINGS  Left Ventricle: Left ventricular ejection fraction, by estimation, is 45 to 50%. The left ventricle has mildly decreased function. The left ventricle has no regional wall motion abnormalities. The left ventricular internal cavity size was mildly dilated. There is borderline left ventricular hypertrophy. Left ventricular diastolic parameters were normal.  LV Wall Scoring: The  mid inferoseptal segment and apical septal segment are hypokinetic. Right Ventricle: The right ventricular size is normal. No increase in right ventricular wall thickness. Right ventricular systolic function is normal. There is  mildly elevated pulmonary artery systolic pressure. The tricuspid regurgitant velocity is 2.73  m/s, and with an assumed right atrial pressure of 15 mmHg, the estimated right ventricular systolic pressure is 60.7 mmHg. Left Atrium: Left atrial size was normal in size. Right Atrium: Right atrial size was normal in size. Pericardium: There is no evidence of pericardial effusion. Mitral Valve: The mitral valve is normal in structure. Moderate mitral valve regurgitation. No evidence of mitral valve stenosis. MV peak gradient, 5.4 mmHg. The mean mitral valve gradient is 3.7 mmHg. Tricuspid Valve: The tricuspid valve is normal in structure. Tricuspid valve regurgitation is moderate . No evidence of tricuspid stenosis. Aortic Valve: The aortic valve is normal in structure. Aortic valve regurgitation is not visualized. No aortic stenosis is present. Aortic valve mean gradient measures 4.0 mmHg. Aortic valve peak gradient measures 7.2 mmHg. Aortic valve area, by VTI measures 2.66 cm. Pulmonic Valve: The pulmonic valve was normal in structure. Pulmonic valve regurgitation is not visualized. No evidence of pulmonic stenosis. Aorta: The aortic root is normal in size and structure. Venous: The inferior vena cava is normal in size with greater than 50% respiratory variability, suggesting right atrial pressure of 3 mmHg. IAS/Shunts: No atrial level shunt detected by color flow Doppler.  LEFT VENTRICLE PLAX 2D LVIDd:         6.10 cm      Diastology LVIDs:         5.30 cm      LV e' medial:    7.72 cm/s LV PW:         1.00 cm      LV E/e' medial:  14.1 LV IVS:        0.90 cm      LV e' lateral:   12.28 cm/s LVOT diam:     2.20 cm      LV E/e' lateral: 8.9 LV SV:         57 LV SV Index:   25 LVOT Area:     3.80 cm  LV Volumes (MOD) LV vol d, MOD A2C: 192.0 ml LV vol d, MOD A4C: 154.0 ml LV vol s, MOD A2C: 145.0 ml LV vol s, MOD A4C: 116.0 ml LV SV MOD A2C:     47.0 ml LV SV MOD A4C:     154.0 ml LV SV MOD BP:      44.1 ml RIGHT  VENTRICLE            IVC RV Basal diam:  4.40 cm    IVC diam: 3.20 cm RV S prime:     7.94 cm/s TAPSE (M-mode): 1.3 cm LEFT ATRIUM              Index        RIGHT ATRIUM           Index LA diam:        5.00 cm  2.23 cm/m   RA Area:     28.90 cm LA Vol (A2C):   81.0 ml  36.15 ml/m  RA Volume:   96.30 ml  42.98 ml/m LA Vol (A4C):   122.0 ml 54.45 ml/m LA Biplane Vol: 101.0 ml 45.08 ml/m  AORTIC VALVE  PULMONIC VALVE AV Area (Vmax):    2.63 cm     PV Vmax:       0.91 m/s AV Area (Vmean):   2.61 cm     PV Peak grad:  3.3 mmHg AV Area (VTI):     2.66 cm AV Vmax:           134.00 cm/s AV Vmean:          93.950 cm/s AV VTI:            0.213 m AV Peak Grad:      7.2 mmHg AV Mean Grad:      4.0 mmHg LVOT Vmax:         92.80 cm/s LVOT Vmean:        64.450 cm/s LVOT VTI:          0.149 m LVOT/AV VTI ratio: 0.70  AORTA Ao Root diam: 3.50 cm Ao Asc diam:  3.30 cm MITRAL VALVE                  TRICUSPID VALVE MV Area (PHT): 3.78 cm       TR Peak grad:   29.8 mmHg MV Area VTI:   3.64 cm       TR Vmax:        273.00 cm/s MV Peak grad:  5.4 mmHg MV Mean grad:  3.7 mmHg       SHUNTS MV Vmax:       1.16 m/s       Systemic VTI:  0.15 m MV Vmean:      86.9 cm/s      Systemic Diam: 2.20 cm MV Decel Time: 201 msec MR Peak grad:    51.4 mmHg MR Mean grad:    34.7 mmHg MR Vmax:         358.33 cm/s MR Vmean:        277.7 cm/s MR PISA:         0.57 cm MR PISA Eff ROA: 6 mm MR PISA Radius:  0.30 cm MV E velocity: 109.00 cm/s Marcina Millard MD Electronically signed by Marcina Millard MD Signature Date/Time: 12/19/2022/1:52:47 PM    Final      Medications:     apixaban  5 mg Oral BID   Chlorhexidine Gluconate Cloth  6 each Topical Daily   diltiazem  60 mg Oral Q6H   docusate sodium  100 mg Oral BID   famotidine  40 mg Oral Daily   metoprolol tartrate  50 mg Oral BID   mometasone-formoterol  2 puff Inhalation BID   polyethylene glycol  17 g Oral Daily   predniSONE  20 mg Oral Q breakfast    sodium zirconium cyclosilicate  10 g Oral Daily   acetaminophen, docusate sodium, ipratropium-albuterol, ondansetron (ZOFRAN) IV, polyethylene glycol  Assessment/ Plan:  Mr. SILVESTRE MINES is a 62 y.o.  male  who was admitted to [facility] on 12/16/2022 for Atrial flutter (HCC) [I48.92] . Patient has a history of asthma, COPD, tobacco abuse who originally presented for right sided abdominal pain was found to be in rapid A-fib/flutter.    Acute Kidney Injury      - likely secondary to IV contrast exposure. May be a component of over diuresis as well.     - Renal function has returned to baseline. UOP 1.1L recorded.    Lab Results  Component Value Date   CREATININE 1.12 12/20/2022   CREATININE 1.74 (H) 12/19/2022   CREATININE  1.82 (H) 12/18/2022          2. Hyperkalemia    - likely secondary to kidney injury and diet. Potassium 4.6 today. Will decrease Lokelma to daily.      Latest Ref Rng & Units 12/20/2022    8:08 AM 12/20/2022    3:43 AM 12/20/2022   12:09 AM  BMP  Glucose 70 - 99 mg/dL  638    BUN 6 - 20 mg/dL  58    Creatinine 4.66 - 1.24 mg/dL  5.99    Sodium 357 - 017 mmol/L  134    Potassium 3.5 - 5.1 mmol/L 4.6  4.8  5.0   Chloride 98 - 111 mmol/L  98    CO2 22 - 32 mmol/L  30    Calcium 8.9 - 10.3 mg/dL  8.3          LOS: 4 Zhuri Krass 1/23/202411:02 AM

## 2022-12-20 NOTE — Evaluation (Signed)
Physical Therapy Evaluation Patient Details Name: Kenneth Benson MRN: [id number] DOB: 1961-11-08 Today's Date: 12/20/2022  History of Present Illness  Pt is a 62 y.o. 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  Pt noted with tachycardia and wheezing/difficulty breathing.  Imaging showing large B inguinal hernias.  Pt admitted with atrial flutter, acute hypoxic respiratory failure, COPD exacerbation (initially on BiPAP), and acute on chronic diastolic CHF.  PMH includes COPD, tobacco use, asthma, elevated BMI, a-fib.  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  Pt has electricity at home but no running water.  No c/o pain during session.  Currently pt is modified independent semi-supine to sitting edge of bed; CGA with transfers; and CGA to take x10 steps in place with B LE's in standing (increased SOB noted with activity but O2 sats 88% or greater on 8 L O2 via HFNC during sessions activities; O2 sats 92% on 8L HFNC at rest end of session).  Pt would benefit from skilled PT to address noted impairments and functional limitations (see below for any additional details).  Upon hospital discharge, anticipate no further PT needs.    Recommendations for follow up therapy are one component of a multi-disciplinary discharge planning process, led by the attending physician.  Recommendations may be updated based on patient status, additional functional criteria and insurance authorization.  Follow Up Recommendations No PT follow up      Assistance Recommended at Discharge PRN  Patient can return home with the following  A little help with bathing/dressing/bathroom;Assistance with cooking/housework;Assist for transportation;Help with stairs or ramp for entrance    Equipment  Recommendations None recommended by PT  Recommendations for Other Services       Functional Status Assessment Patient has had a recent decline in their functional status and demonstrates the ability to make significant improvements in function in a reasonable and predictable amount of time.     Precautions / Restrictions Precautions Precautions: Fall Restrictions Weight Bearing Restrictions: No      Mobility  Bed Mobility Overal bed mobility: Modified Independent             General bed mobility comments: HOB elevated but no physical assistance required    Transfers Overall transfer level: Needs assistance Equipment used: None Transfers: Sit to/from Stand, Bed to chair/wheelchair/BSC Sit to Stand: Min guard   Step pivot transfers: Min guard       General transfer comment: steady standing and taking steps to bed    Ambulation/Gait Ambulation/Gait assistance: Min guard Gait Distance (Feet):  (pt took x10 steps in place B LE's) Assistive device: None   Gait velocity: decreased     General Gait Details: steady taking steps in place  Stairs            Wheelchair Mobility    Modified Rankin (Stroke Patients Only)       Balance Overall balance assessment: Needs assistance Sitting-balance support: No upper extremity supported, Feet supported Sitting balance-Leahy Scale: Normal Sitting balance - Comments: steady sitting reaching outside BOS   Standing balance support: During functional activity, No upper extremity supported Standing balance-Leahy Scale: Good Standing balance comment: steady with standing activities  Pertinent Vitals/Pain Pain Assessment Pain Assessment: No/denies pain HR 76 bpm to 105 bpm during sessions activities.    Home Living Family/patient expects to be discharged to:: Private residence Living Arrangements: Spouse/significant other (girlfriend [name]) Available Help at Discharge:  Friend(s);Available 24 hours/day (girlfriend available 24/7 per pt) Type of Home: Mobile home Home Access: Stairs to enter Entrance Stairs-Rails: Left;Right;Can reach both Entrance Stairs-Number of Steps: 5   Home Layout: One level        Prior Function Prior Level of Function : Independent/Modified Independent;Working/employed             Mobility Comments: Independent with ambulation; Nature conservation officer. ADLs Comments: Independent with ADL's and IADL's.  Pt's girlfriend drives pt to work.     Hand Dominance   Dominant Hand: Right    Extremity/Trunk Assessment   Upper Extremity Assessment Upper Extremity Assessment: Overall WFL for tasks assessed    Lower Extremity Assessment Lower Extremity Assessment: Generalized weakness       Communication   Communication: No difficulties  Cognition Arousal/Alertness: Awake/alert Behavior During Therapy: WFL for tasks assessed/performed Overall Cognitive Status: Within Functional Limits for tasks assessed                                 General Comments: Pleasant and participatory.        General Comments  Nursing cleared pt for participation in physical therapy.  Pt agreeable to PT session.    Exercises     Assessment/Plan    PT Assessment Patient needs continued PT services  PT Problem List Decreased strength;Decreased activity tolerance;Decreased balance;Decreased mobility;Decreased knowledge of use of DME;Decreased knowledge of precautions;Cardiopulmonary status limiting activity       PT Treatment Interventions DME instruction;Gait training;Stair training;Functional mobility training;Therapeutic activities;Therapeutic exercise;Balance training;Patient/family education    PT Goals (Current goals can be found in the Care Plan section)  Acute Rehab PT Goals Patient Stated Goal: to go home PT Goal Formulation: With patient Time For Goal Achievement: 01/03/23 Potential to Achieve Goals: Good     Frequency Min 2X/week     Co-evaluation               AM-PAC PT "6 Clicks" Mobility  Outcome Measure Help needed turning from your back to your side while in a flat bed without using bedrails?: None Help needed moving from lying on your back to sitting on the side of a flat bed without using bedrails?: None Help needed moving to and from a bed to a chair (including a wheelchair)?: A Little Help needed standing up from a chair using your arms (e.g., wheelchair or bedside chair)?: A Little Help needed to walk in hospital room?: A Little Help needed climbing 3-5 steps with a railing? : A Little 6 Click Score: 20    End of Session Equipment Utilized During Treatment: Gait belt;Oxygen (8 L via HFNC) Activity Tolerance: Other (comment) (Limited d/t SOB) Patient left: in chair;with call bell/phone within reach;with chair alarm set;Other (comment) (MD present) Nurse Communication: Mobility status;Precautions PT Visit Diagnosis: Other abnormalities of gait and mobility (R26.89);Muscle weakness (generalized) (M62.81)    Time: 5397-6734 PT Time Calculation (min) (ACUTE ONLY): 24 min   Charges:   PT Evaluation $PT Eval Low Complexity: 1 Low PT Treatments $Therapeutic Activity: 8-22 mins       Daniqua Campoy, PT 12/20/22, 11:13 AM

## 2022-12-20 NOTE — Assessment & Plan Note (Signed)
Likely due to COPD exacerbation and some fluid overload, received bolus of fluid in the ED. initially required BiPAP so was admitted for stepdown but later weaned off BiPAP to high flow nasal cannula -transfer out of stepdown to PCU.  PCCM signed off.  Currently on 8 L HFNC.  Wean as able

## 2022-12-20 NOTE — Assessment & Plan Note (Signed)
Likely multifactorial from possible ATN due to hypotension, contrast-induced nephropathy, overdiuresis with Lasix.  AKI has resolved now .  Hold Lasix for now.  Nephrology following  Lab Results  Component Value Date   CREATININE 1.12 12/20/2022   CREATININE 1.74 (H) 12/19/2022   CREATININE 1.82 (H) 12/18/2022

## 2022-12-20 NOTE — Assessment & Plan Note (Signed)
He was first diagnosed with A-fib in February 2023 after a COPD flareup.  He had follow-up with Dr. Donnelly Angelica at [facility] in April 2023 -He had lost follow-up since then. Continue oral metoprolol and Cardizem for rate control Cardiology starting Cardizem CD from tomorrow Continue Eliquis 5 mg p.o. twice daily -2D echo shows EF 45-50% Cardio following

## 2022-12-20 NOTE — Assessment & Plan Note (Signed)
Complicates overall prognosis. 

## 2022-12-20 NOTE — Plan of Care (Signed)
  Problem: Education: ?Goal: Knowledge of General Education information will improve ?Description: Including pain rating scale, medication(s)/side effects and non-pharmacologic comfort measures ?Outcome: Progressing ?  ?Problem: Clinical Measurements: ?Goal: Will remain free from infection ?Outcome: Progressing ?  ?Problem: Clinical Measurements: ?Goal: Respiratory complications will improve ?Outcome: Progressing ?  ?Problem: Clinical Measurements: ?Goal: Cardiovascular complication will be avoided ?Outcome: Progressing ?  ?Problem: Activity: ?Goal: Risk for activity intolerance will decrease ?Outcome: Progressing ?  ?Problem: Pain Managment: ?Goal: General experience of comfort will improve ?Outcome: Progressing ?  ?Problem: Safety: ?Goal: Ability to remain free from injury will improve ?Outcome: Progressing ?  ?

## 2022-12-20 NOTE — Progress Notes (Addendum)
Pt HR is sustaining from 130'-140's. Pt asymptomatic at this time. NP Foust made aware. Will continue to monitor.  Update 2029: Per NP Foust to go ahead and give metropolol 50 mg tablet schedule now and will re assessed HR after an hour. Will continue to monitor.  Update 2304: Pt HR 119-128 non sustain. NP Foust made aware but no new order place.Will continue to monitor.

## 2022-12-20 NOTE — Progress Notes (Signed)
Occupational Therapy Treatment Patient Details Name: Kenneth Benson MRN: [id number] DOB: March 10, 1961 Today's Date: 12/20/2022   History of present illness Pt is a 62 y.o. 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  Pt noted with tachycardia and wheezing/difficulty breathing.  Imaging showing large B inguinal hernias.  Pt admitted with atrial flutter, acute hypoxic respiratory failure, COPD exacerbation (initially on BiPAP), and acute on chronic diastolic CHF.  PMH includes COPD, tobacco use, asthma, elevated BMI, a-fib.   OT comments  Upon entering the room, pt seated in recliner chair and agreeable to OT intervention. Pt reporting need for BM. Pt stands from recliner chair with supervision and ambulates to bathroom with min guard while on 8Ls via HFNC. Pt seated on commode with supervision and has BM with continues cues for pursed lip breathing. O2 saturation remains above 90% during session. Pt performs hygiene while seated and stands at sink for hand hygiene. Pt clearly becoming more fatigues as he needs cues for upright positioning. Pt returning to bed in same manner as above. Pt does endorse use of pulse ox at home and it being "82-94" and " It was in the 90's when I was sitting down and not doing anything". Call bell and all needed items within reach upon exiting the room.    Recommendations for follow up therapy are one component of a multi-disciplinary discharge planning process, led by the attending physician.  Recommendations may be updated based on patient status, additional functional criteria and insurance authorization.    Follow Up Recommendations  No OT follow up     Assistance Recommended at Discharge Intermittent Supervision/Assistance  Patient can return home with the following  A little help with walking and/or transfers;A little help with bathing/dressing/bathroom;Help with  stairs or ramp for entrance;Assist for transportation;Assistance with cooking/housework   Equipment Recommendations  None recommended by OT       Precautions / Restrictions Precautions Precautions: Fall Restrictions Weight Bearing Restrictions: No       Mobility Bed Mobility Overal bed mobility: Modified Independent                  Transfers Overall transfer level: Needs assistance Equipment used: None Transfers: Sit to/from Stand Sit to Stand: Supervision                 Balance Overall balance assessment: Needs assistance Sitting-balance support: No upper extremity supported, Feet supported Sitting balance-Leahy Scale: Normal     Standing balance support: During functional activity, No upper extremity supported Standing balance-Leahy Scale: Good Standing balance comment: steady with standing activities                           ADL either performed or assessed with clinical judgement   ADL Overall ADL's : Needs assistance/impaired                         Toilet Transfer: Supervision/safety;Regular Museum/gallery exhibitions officer and Hygiene: Supervision/safety;Sit to/from stand              Extremity/Trunk Assessment Upper Extremity Assessment Upper Extremity Assessment: Overall WFL for tasks assessed   Lower Extremity Assessment Lower Extremity Assessment: Generalized weakness        Vision Patient Visual Report: No change from baseline            Cognition  Arousal/Alertness: Awake/alert Behavior During Therapy: WFL for tasks assessed/performed Overall Cognitive Status: Within Functional Limits for tasks assessed                                 General Comments: Pleasant and participatory.                   Pertinent Vitals/ Pain       Pain Assessment Pain Assessment: No/denies pain  Home Living Family/patient expects to be discharged to:: Private residence Living  Arrangements: Spouse/significant other (girlfriend [name]) Available Help at Discharge: Friend(s);Available 24 hours/day (girlfriend available 24/7 per pt) Type of Home: Mobile home Home Access: Stairs to enter Entrance Stairs-Number of Steps: 5 Entrance Stairs-Rails: Left;Right;Can reach both Home Layout: One level     Bathroom Shower/Tub: Teacher, early years/pre: Standard                    Frequency  Min 2X/week        Progress Toward Goals  OT Goals(current goals can now be found in the care plan section)  Progress towards OT goals: Progressing toward goals  Acute Rehab OT Goals Patient Stated Goal: to return home OT Goal Formulation: With patient Time For Goal Achievement: 01/02/23 Potential to Achieve Goals: [place] Discharge plan remains appropriate;Frequency remains appropriate       AM-PAC OT "6 Clicks" Daily Activity     Outcome Measure   Help from another person eating meals?: None Help from another person taking care of personal grooming?: None Help from another person toileting, which includes using toliet, bedpan, or urinal?: None Help from another person bathing (including washing, rinsing, drying)?: None Help from another person to put on and taking off regular upper body clothing?: None Help from another person to put on and taking off regular lower body clothing?: None 6 Click Score: 24    End of Session    OT Visit Diagnosis: Unsteadiness on feet (R26.81);Muscle weakness (generalized) (M62.81)   Activity Tolerance Patient tolerated treatment well   Patient Left in bed;with call bell/phone within reach   Nurse Communication Mobility status        Time: 9604-5409 OT Time Calculation (min): 34 min  Charges: OT General Charges $OT Visit: 1 Visit OT Treatments $Self Care/Home Management : 23-37 mins  Darleen Crocker, MS, OTR/L , CBIS ascom [phone]  12/20/22, 12:45 PM

## 2022-12-20 NOTE — Assessment & Plan Note (Signed)
Continue prednisone 20 mg daily Weaned off BiPAP to high flow nasal cannula 8 L Continue inhalers and nebulizers COVID and influenza negative PCCM signed off.

## 2022-12-21 ENCOUNTER — Other Ambulatory Visit (HOSPITAL_COMMUNITY): Payer: Self-pay

## 2022-12-21 ENCOUNTER — Telehealth (HOSPITAL_COMMUNITY): Payer: Self-pay | Admitting: Pharmacy Technician

## 2022-12-21 DIAGNOSIS — I480 Paroxysmal atrial fibrillation: Secondary | ICD-10-CM

## 2022-12-21 DIAGNOSIS — N179 Acute kidney failure, unspecified: Secondary | ICD-10-CM | POA: Diagnosis not present

## 2022-12-21 DIAGNOSIS — I071 Rheumatic tricuspid insufficiency: Secondary | ICD-10-CM | POA: Insufficient documentation

## 2022-12-21 DIAGNOSIS — J9601 Acute respiratory failure with hypoxia: Secondary | ICD-10-CM | POA: Diagnosis not present

## 2022-12-21 DIAGNOSIS — I5043 Acute on chronic combined systolic (congestive) and diastolic (congestive) heart failure: Secondary | ICD-10-CM | POA: Diagnosis not present

## 2022-12-21 LAB — BASIC METABOLIC PANEL
Anion gap: 3 — ABNORMAL LOW (ref 5–15)
BUN: 41 mg/dL — ABNORMAL HIGH (ref 6–20)
CO2: 36 mmol/L — ABNORMAL HIGH (ref 22–32)
Calcium: 8.5 mg/dL — ABNORMAL LOW (ref 8.9–10.3)
Chloride: 100 mmol/L (ref 98–111)
Creatinine, Ser: 0.84 mg/dL (ref 0.61–1.24)
GFR, Estimated: >60 mL/min (ref >60)
Glucose, Bld: 107 mg/dL — ABNORMAL HIGH (ref 70–99)
Potassium: 4.5 mmol/L (ref 3.5–5.1)
Sodium: 139 mmol/L (ref 135–145)

## 2022-12-21 LAB — CBC
HCT: 44.2 % (ref 39.0–52.0)
Hemoglobin: 13.5 g/dL (ref 13.0–17.0)
MCH: 29.5 pg (ref 26.0–34.0)
MCHC: 30.5 g/dL (ref 30.0–36.0)
MCV: 96.5 fL (ref 80.0–100.0)
Platelets: 199 10*3/uL (ref 150–400)
RBC: 4.58 MIL/uL (ref 4.22–5.81)
RDW: 15.7 % — ABNORMAL HIGH (ref 11.5–15.5)
WBC: 6.2 10*3/uL (ref 4.0–10.5)
nRBC: 0 % (ref 0.0–0.2)

## 2022-12-21 LAB — MAGNESIUM: Magnesium: 2.2 mg/dL (ref 1.7–2.4)

## 2022-12-21 LAB — POTASSIUM: Potassium: 4.4 mmol/L (ref 3.5–5.1)

## 2022-12-21 LAB — BRAIN NATRIURETIC PEPTIDE: B Natriuretic Peptide: 1985.5 pg/mL — ABNORMAL HIGH (ref 0.0–100.0)

## 2022-12-21 MED ORDER — FUROSEMIDE 10 MG/ML IJ SOLN
40.0000 mg | Freq: Two times a day (BID) | INTRAMUSCULAR | Status: DC
  Administered 2022-12-21 – 2022-12-22 (×2): 40 mg via INTRAVENOUS
  Filled 2022-12-21 (×2): qty 4

## 2022-12-21 MED ORDER — EMPAGLIFLOZIN 10 MG PO TABS
10.0000 mg | ORAL_TABLET | Freq: Every day | ORAL | Status: DC
  Administered 2022-12-21 – 2022-12-24 (×4): 10 mg via ORAL
  Filled 2022-12-21 (×4): qty 1

## 2022-12-21 MED ORDER — DILTIAZEM HCL 30 MG PO TABS
90.0000 mg | ORAL_TABLET | Freq: Four times a day (QID) | ORAL | Status: DC
  Administered 2022-12-21 – 2022-12-23 (×7): 90 mg via ORAL
  Filled 2022-12-21 (×7): qty 3

## 2022-12-21 MED ORDER — DIGOXIN 0.25 MG/ML IJ SOLN
0.2500 mg | Freq: Once | INTRAMUSCULAR | Status: AC
  Administered 2022-12-21: 0.25 mg via INTRAVENOUS
  Filled 2022-12-21: qty 2

## 2022-12-21 MED ORDER — METOPROLOL TARTRATE 5 MG/5ML IV SOLN
5.0000 mg | Freq: Once | INTRAVENOUS | Status: AC
  Administered 2022-12-21: 5 mg via INTRAVENOUS
  Filled 2022-12-21: qty 5

## 2022-12-21 MED ORDER — DILTIAZEM HCL ER COATED BEADS 120 MG PO CP24
240.0000 mg | ORAL_CAPSULE | Freq: Every day | ORAL | Status: DC
  Administered 2022-12-21: 240 mg via ORAL
  Filled 2022-12-21: qty 2

## 2022-12-21 MED ORDER — METOPROLOL TARTRATE 50 MG PO TABS
50.0000 mg | ORAL_TABLET | Freq: Three times a day (TID) | ORAL | Status: DC
  Administered 2022-12-21 – 2022-12-22 (×3): 50 mg via ORAL
  Filled 2022-12-21 (×3): qty 1

## 2022-12-21 MED ORDER — IPRATROPIUM-ALBUTEROL 0.5-2.5 (3) MG/3ML IN SOLN
3.0000 mL | Freq: Two times a day (BID) | RESPIRATORY_TRACT | Status: DC
  Administered 2022-12-21 – 2022-12-24 (×6): 3 mL via RESPIRATORY_TRACT
  Filled 2022-12-21 (×4): qty 3
  Filled 2022-12-21: qty 36
  Filled 2022-12-21: qty 3

## 2022-12-21 MED ORDER — DILTIAZEM HCL ER COATED BEADS 120 MG PO CP24: 240.0000 mg | ORAL_CAPSULE | Freq: Every day | ORAL | Status: DC

## 2022-12-21 NOTE — Progress Notes (Signed)
Physical Therapy Treatment Patient Details Name: Kenneth Benson MRN: [id number] DOB: 03-07-1961 Today's Date: 12/21/2022   History of Present Illness Pt is a 62 y.o. 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  Pt noted with tachycardia and wheezing/difficulty breathing.  Imaging showing large B inguinal hernias.  Pt admitted with atrial flutter, acute hypoxic respiratory failure, COPD exacerbation (initially on BiPAP), and acute on chronic diastolic CHF.  PMH includes COPD, tobacco use, asthma, elevated BMI, a-fib.    PT Comments    Pt pleasant and eager to work with PT.  He was on 4L O2 t/o the session, SpO2 in the low 90s on arrival, HR variable 90s-120s at rest.  Pt eager to see how he would do with prolonged bout of ambulation, per discussion with RN attempted ambulation on 4L, utilized walker for energy conservation.  Ultimately he was able to slowly but safety walk 200 ft around the nurses' station with consistent cuing for focused breathing and a few brief standing rest breaks to check vitals; O2 did drop slowly from ~90 to 86, HR variable but rarely >130 and pt tired with the effort but did not endorse excessive fatigue or display SOB.     Recommendations for follow up therapy are one component of a multi-disciplinary discharge planning process, led by the attending physician.  Recommendations may be updated based on patient status, additional functional criteria and insurance authorization.  Follow Up Recommendations  No PT follow up     Assistance Recommended at Discharge PRN  Patient can return home with the following     Equipment Recommendations  None recommended by PT    Recommendations for Other Services       Precautions / Restrictions Precautions Precautions: Fall Restrictions Weight Bearing Restrictions: No     Mobility  Bed Mobility Overal bed mobility: Modified  Independent                  Transfers Overall transfer level: Needs assistance Equipment used: Rolling walker (2 wheels) Transfers: Sit to/from Stand Sit to Stand: Supervision           General transfer comment: able to rise w/o assist from lowest bed setting, did need cues for hand placement after unsuccessful first attempt w/ UEs on walker    Ambulation/Gait Ambulation/Gait assistance: Min guard Gait Distance (Feet): 200 Feet Assistive device: Rolling walker (2 wheels)         General Gait Details: Pt on 4L O2 t/o the effort, used walker for energy conservation.  He was able to slowly but steadily circumambulate the nurses' station with HR staying in the 100-120s most of the time, O2 slowly dropping from low 90s to 86% by the end. No LOBs or overt safety issues or excessive reliance on the walker.   Stairs             Wheelchair Mobility    Modified Rankin (Stroke Patients Only)       Balance Overall balance assessment: Needs assistance Sitting-balance support: No upper extremity supported, Feet supported Sitting balance-Leahy Scale: Normal       Standing balance-Leahy Scale: Good Standing balance comment: steady with standing activities - utilized walker for ambulation due to energy conservation planning                            Cognition Arousal/Alertness: Awake/alert Behavior During  Therapy: WFL for tasks assessed/performed Overall Cognitive Status: Within Functional Limits for tasks assessed                                          Exercises      General Comments        Pertinent Vitals/Pain Pain Assessment Pain Assessment: No/denies pain    Home Living                          Prior Function            PT Goals (current goals can now be found in the care plan section) Progress towards PT goals: Progressing toward goals    Frequency    Min 2X/week      PT Plan Current plan  remains appropriate    Co-evaluation              AM-PAC PT "6 Clicks" Mobility   Outcome Measure  Help needed turning from your back to your side while in a flat bed without using bedrails?: None Help needed moving from lying on your back to sitting on the side of a flat bed without using bedrails?: None Help needed moving to and from a bed to a chair (including a wheelchair)?: None Help needed standing up from a chair using your arms (e.g., wheelchair or bedside chair)?: None Help needed to walk in hospital room?: A Little Help needed climbing 3-5 steps with a railing? : A Little 6 Click Score: 22    End of Session Equipment Utilized During Treatment: Gait belt;Oxygen (4L) Activity Tolerance: Patient limited by fatigue;Patient tolerated treatment well Patient left: with bed alarm set;with call bell/phone within reach Nurse Communication: Mobility status (SpO2 on 4L with prolonged ambulation) PT Visit Diagnosis: Other abnormalities of gait and mobility (R26.89);Muscle weakness (generalized) (M62.81)     Time: 6433-2951 PT Time Calculation (min) (ACUTE ONLY): 25 min  Charges:  $Gait Training: 8-22 mins $Therapeutic Exercise: 8-22 mins                     Kreg Shropshire, DPT 12/21/2022, 5:51 PM

## 2022-12-21 NOTE — Telephone Encounter (Signed)
Patient Advocate Encounter  Prior Authorization for Jardiance 10 mg has been approved.     Effective dates: 12/21/2022 through 12/22/2023  Patients co-pay is $4.00.     Lyndel Safe, [place] Patient Advocate Specialist [place] Patient Advocate Team Direct Number: [phone]  Fax: [phone]

## 2022-12-21 NOTE — Progress Notes (Addendum)
[facility]  Patient ID: Kenneth Benson MRN: [id number] DOB/AGE: March 24, 1961 62 y.o.   Admit date: 12/16/2022 Referring Physician Kenneth Benson Primary Physician none Primary Cardiologist Kenneth Benson Reason for Consultation atrial fibrillation  HPI: Kenneth Benson is a 62 YOM with a PMH of asthma, COPD, ongoing tobacco use, paroxysmal AF on Eliquis who presented to [facility] ED 12/16/2022 with a chief complaint of right-sided abdominal pain.  There was concern for mesenteric ischemia however CT was unremarkable with exception of large bilateral inguinal hernia.  The patient was noted to be in atrial fibrillation with rapid ventricular rate at 153 bpm.  Patient was treated with Eliquis, metoprolol tartrate 25 mg twice daily, and Cardizem bolus and infusion.  Initially required BiPAP, now weaned to [facility] and is being treated for a COPD exacerbation.  Nephrology also following for AKI and hyperkalemia.  Interval history: -States he feels good, denies shortness of breath above his baseline -No chest pain, heart racing, still with trace-1+ peripheral edema -Remains in atrial fibrillation, rate up to 130s-140s overnight,     Vitals:   12/21/22 0422 12/21/22 0621 12/21/22 0809 12/21/22 1123  BP: 106/76 116/78 120/69 109/76  Pulse: (!) 113 83 61 (!) 107  Resp: (!) 24 (!) 21 (!) 22 20  Temp: 97.8 F (36.6 C) 97.8 F (36.6 C) 98.6 F (37 C) 97.8 F (36.6 C)  TempSrc:      SpO2: 98% 97% 94% 93%  Weight:      Height:         Intake/Output Summary (Last 24 hours) at 12/21/2022 1204 Last data filed at 12/21/2022 1026 Gross per 24 hour  Intake 120 ml  Output 2045 ml  Net -1925 ml       PHYSICAL EXAM  General: ill appearing caucasian male, sitting upright in recliner in PCU  HEENT:  Normocephalic and atraumatic Neck:  No JVD.  Lungs: slight conversational dyspnea on HFNC, decreased breathdiffuse expiratory wheezing  Heart: irregularly irregular with mostly controlled rate . Normal S1 and S2 without  gallops or murmurs.  Abdomen: nondistended appearing with excess adiposity  Msk:   Normal strength and tone for age. Extremities: No clubbing, cyanosis. Trace bilateral  edema.   Neuro: Alert and oriented X 3. Psych:  Good affect, responds appropriately   LABS: Basic Metabolic Panel: Recent Labs    12/20/22 0343 12/20/22 0808 12/20/22 1624 12/21/22 0322  NA 134*  --   --  139  K 4.8   < > 4.6 4.5  CL 98  --   --  100  CO2 30  --   --  36*  GLUCOSE 127*  --   --  107*  BUN 58*  --   --  41*  CREATININE 1.12  --   --  0.84  CALCIUM 8.3*  --   --  8.5*   < > = values in this interval not displayed.    Liver Function Tests: No results for input(s): "AST", "ALT", "ALKPHOS", "BILITOT", "PROT", "ALBUMIN" in the last 72 hours.  No results for input(s): "LIPASE", "AMYLASE" in the last 72 hours.  CBC: Recent Labs    12/20/22 0343 12/21/22 0322  WBC 9.2 6.2  HGB 13.3 13.5  HCT 42.5 44.2  MCV 94.4 96.5  PLT 206 199    Cardiac Enzymes: No results for input(s): "CKTOTAL", "CKMB", "CKMBINDEX", "TROPONINI" in the last 72 hours. BNP: Invalid input(s): "POCBNP" D-Dimer: No results for input(s): "DDIMER" in the last 72 hours. Hemoglobin A1C: No results  for input(s): "HGBA1C" in the last 72 hours. Fasting Lipid Panel: No results for input(s): "CHOL", "HDL", "LDLCALC", "TRIG", "CHOLHDL", "LDLDIRECT" in the last 72 hours.  Thyroid Function Tests: No results for input(s): "TSH", "T4TOTAL", "T3FREE", "THYROIDAB" in the last 72 hours.  Invalid input(s): "FREET3"  Anemia Panel: No results for input(s): "VITAMINB12", "FOLATE", "FERRITIN", "TIBC", "IRON", "RETICCTPCT" in the last 72 hours.  No results found.   Echo 01/29/2022: EF 50-55%, trivial MR  TELEMETRY: atrial Fibrillation at 110s-130s this am  ASSESSMENT AND PLAN:  Principal Problem:   Atrial fibrillation with RVR (HCC) Active Problems:   COPD exacerbation (HCC)   Acute hypoxic respiratory failure (HCC)    Overweight (BMI 25.0-29.9)   Acute on chronic diastolic CHF (congestive heart failure) (HCC)   AKI (acute kidney injury) (HCC)   Hyperkalemia   1. Atrial fibrillation with rapid ventricular rate, started on Eliquis for stroke prevention, and metoprolol  tartrate and Cardizem bolus and infusion for rate and rhythm control.  Cardizem infusion transition to diltiazem 60 mg p.o. every 6 hours.  Heart rate remains elevated though patient currently asymptomatic. 2.  Acute hypoxic respiratory failure, secondary to COPD exacerbation 3.  new HFmrEF , chest x-ray revealing pulmonary interstitial edema, echo 12/18/2022 revealed EF 45-50% without RWMA's, moderate MR, moderate TR. Somewhat hypervolemic appearing, BNP up 1/24 at 1985 4.   AKI, felt to be from IV contrast vs overdiuresis, nephrology following 5.  Hyperkalemia, s/p lokelma  6.  Tobacco abuse   Recommendations   1.  Agree with current therapy 2.  Continue Eliquis for stroke prevention 3.  consolidate diltiazem to 240 CD today 4.  Continue metoprolol tartrate 50 mg twice daily. Can increase if HR remains up  5.  initiation of GDMT limited by hyperkalemia (MRA, ARNi/ARB), agreeable to start jardiance 10mg  daily (appreciate pharmacy working on prior [place])  6.  Agree with diuresis, closely monitor renal function 7.  Strongly encouraged complete smoking cessation. He is motivated to quit  Addendum 2:59PM: rate still up in AF RVR 120s-140s, hospitalist added dig IV x1, I changed diltiazem to PO 90mg  q6h and increased metoprolol to 50mg  three times daily   This patient's plan of care was discussed and created with Dr. Saralyn Pilar and he is in agreement.    Kenneth Schroeder, PA-C 12/21/2022 12:04 PM

## 2022-12-21 NOTE — TOC Benefit Eligibility Note (Signed)
Patient Teacher, English as a foreign language completed.    The patient is currently admitted and upon discharge could be taking Jardiance 10 mg.  Requires Prior Authorization  The patient is insured through [place], [place] Patient Advocate Specialist [place] Patient Advocate Team Direct Number: [phone]  Fax: [phone]

## 2022-12-21 NOTE — Progress Notes (Signed)
[facility]  ROUNDING NOTE   Subjective:   [name] is a 62 y.o.  male with , who was admitted to [facility] on 12/16/2022 for Atrial flutter (HCC) [I48.92] . Patient has a history of asthma, COPD, tobacco abuse who originally presented for right sided abdominal pain was found to be in rapid A-fib/flutter.    Patient seen sitting up in bed Alert and oriented  Room air Denies nausea and vomiting  Objective:  Vital signs in last 24 hours:  Temp:  [97.6 F (36.4 C)-98.6 F (37 C)] 97.8 F (36.6 C) (01/24 1123) Pulse Rate:  [55-137] 107 (01/24 1123) Resp:  [16-24] 20 (01/24 1123) BP: (101-152)/(69-93) 109/76 (01/24 1123) SpO2:  [93 %-100 %] 93 % (01/24 1123) FiO2 (%):  [60 %-93 %] 93 % (01/24 0707)  Weight change:  Filed Weights   12/17/22 1746 12/18/22 1010  Weight: 99.8 kg 126.3 kg    Intake/Output: I/O last 3 completed shifts: In: 120 [P.O.:120] Out: 2245 [Urine:2245]   Intake/Output this shift:  Total I/O In: -  Out: 300 [Urine:300]  Physical Exam: General: NAD  Head: Normocephalic, atraumatic. Moist oral mucosal membranes  Eyes: Anicteric  Lungs:  Clear to auscultation, normal effort, [place]  Heart: Regular rate and rhythm  Abdomen:  Soft, nontender  Extremities: No peripheral edema.  Neurologic: Nonfocal, moving all four extremities  Skin: No lesions  Access: None     Basic Metabolic Panel: Recent Labs  Lab 12/17/22 0615 12/17/22 1510 12/18/22 0520 12/18/22 0844 12/18/22 1149 12/18/22 2014 12/19/22 0431 12/19/22 0844 12/20/22 0343 12/20/22 0808 12/20/22 1223 12/20/22 1624 12/21/22 0322  NA 136   < > 132* 131*  --   --  131*  --  134*  --   --   --  139  K 5.9*   < > 6.0* 5.8* 5.7*   < > 5.8*   < > 4.8 4.6 4.6 4.6 4.5  CL 102   < > 97* 98  --   --  95*  --  98  --   --   --  100  CO2 28   < > 27 26  --   --  29  --  30  --   --   --  36*  GLUCOSE 189*   < > 154* 146* 114*  --  154*  --  127*  --   --   --  107*  BUN 30*   < > 47*  48*  --   --  61*  --  58*  --   --   --  41*  CREATININE 1.56*   < > 1.83* 1.82*  --   --  1.74*  --  1.12  --   --   --  0.84  CALCIUM 7.7*   < > 7.7* 7.5*  --   --  8.0*  --  8.3*  --   --   --  8.5*  MG 2.2  --   --   --   --   --   --   --   --   --   --   --   --    < > = values in this interval not displayed.     Liver Function Tests: Recent Labs  Lab 12/16/22 1305  AST 22  ALT 29  ALKPHOS 44  BILITOT 1.3*  PROT 7.0  ALBUMIN 3.8    Recent Labs  Lab 12/16/22 1305  LIPASE 30    No results for input(s): "AMMONIA" in the last 168 hours.  CBC: Recent Labs  Lab 12/16/22 1305 12/17/22 0615 12/18/22 0520 12/19/22 0431 12/20/22 0343 12/21/22 0322  WBC 8.4 10.3 10.9* 9.4 9.2 6.2  NEUTROABS 6.8  --   --   --   --   --   HGB 13.7 14.1 13.8 14.2 13.3 13.5  HCT 44.2 47.5 45.6 46.6 42.5 44.2  MCV 95.1 99.0 97.2 97.1 94.4 96.5  PLT 178 199 189 201 206 199     Cardiac Enzymes: No results for input(s): "CKTOTAL", "CKMB", "CKMBINDEX", "TROPONINI" in the last 168 hours.  BNP: Invalid input(s): "POCBNP"  CBG: Recent Labs  Lab 12/18/22 1008  [place]*     Microbiology: Results for orders placed or performed during the hospital encounter of 12/16/22  Resp panel by RT-PCR (RSV, Flu A&B, Covid) Anterior Nasal Swab     Status: None   Collection Time: 12/16/22  1:18 PM   Specimen: Anterior Nasal Swab  Result Value Ref Range Status   SARS Coronavirus 2 by RT PCR NEGATIVE NEGATIVE Final    Comment: (NOTE) SARS-CoV-2 target nucleic acids are NOT DETECTED.  The SARS-CoV-2 RNA is generally detectable in upper respiratory specimens during the acute phase of infection. The lowest concentration of SARS-CoV-2 viral copies this assay can detect is 138 copies/mL. A negative result does not preclude SARS-Cov-2 infection and should not be used as the sole basis for treatment or other patient management decisions. A negative result may occur with  improper specimen  collection/handling, submission of specimen other than nasopharyngeal swab, presence of viral mutation(s) within the areas targeted by this assay, and inadequate number of viral copies(<138 copies/mL). A negative result must be combined with clinical observations, patient history, and epidemiological information. The expected result is Negative.  Fact Sheet for Patients:  EntrepreneurPulse.com.au  Fact Sheet for Healthcare Providers:  IncredibleEmployment.be  This test is no t yet approved or cleared by the [place] FDA and  has been authorized for detection and/or diagnosis of SARS-CoV-2 by FDA under an Emergency Use Authorization (EUA). This EUA will remain  in effect (meaning this test can be used) for the duration of the COVID-19 declaration under Section 564(b)(1) of the Act, 21 U.S.C.section 360bbb-3(b)(1), unless the authorization is terminated  or revoked sooner.       Influenza A by PCR NEGATIVE NEGATIVE Final   Influenza B by PCR NEGATIVE NEGATIVE Final    Comment: (NOTE) The Xpert Xpress SARS-CoV-2/FLU/RSV plus assay is intended as an aid in the diagnosis of influenza from Nasopharyngeal swab specimens and should not be used as a sole basis for treatment. Nasal washings and aspirates are unacceptable for Xpert Xpress SARS-CoV-2/FLU/RSV testing.  Fact Sheet for Patients: EntrepreneurPulse.com.au  Fact Sheet for Healthcare Providers: IncredibleEmployment.be  This test is not yet approved or cleared by the [place] FDA and has been authorized for detection and/or diagnosis of SARS-CoV-2 by FDA under an Emergency Use Authorization (EUA). This EUA will remain in effect (meaning this test can be used) for the duration of the COVID-19 declaration under Section 564(b)(1) of the Act, 21 U.S.C. section 360bbb-3(b)(1), unless the authorization is terminated or revoked.     Resp Syncytial  Virus by PCR NEGATIVE NEGATIVE Final    Comment: (NOTE) Fact Sheet for Patients: EntrepreneurPulse.com.au  Fact Sheet for Healthcare Providers: IncredibleEmployment.be  This test is not yet approved or cleared by the [place] and  has been authorized for detection and/or diagnosis of SARS-CoV-2 by FDA under an Emergency Use Authorization (EUA). This EUA will remain in effect (meaning this test can be used) for the duration of the COVID-19 declaration under Section 564(b)(1) of the Act, 21 U.S.C. section 360bbb-3(b)(1), unless the authorization is terminated or revoked.  Performed at [facility], [address] [place], [place] [address]   MRSA Next Gen by PCR, Nasal     Status: None   Collection Time: 12/18/22 10:15 AM   Specimen: Nasal Mucosa; Nasal Swab  Result Value Ref Range Status   MRSA by PCR Next Gen NOT DETECTED NOT DETECTED Final    Comment: (NOTE) The GeneXpert MRSA Assay (FDA approved for NASAL specimens only), is one component of a comprehensive MRSA colonization surveillance program. It is not intended to diagnose MRSA infection nor to guide or monitor treatment for MRSA infections. Test performance is not FDA approved in patients less than 4 years old. Performed at [facility], [address] [place], [place] [address]     Coagulation Studies: No results for input(s): "LABPROT", "INR" in the last 72 hours.  Urinalysis: No results for input(s): "COLORURINE", "LABSPEC", "PHURINE", "GLUCOSEU", "HGBUR", "BILIRUBINUR", "KETONESUR", "PROTEINUR", "UROBILINOGEN", "NITRITE", "LEUKOCYTESUR" in the last 72 hours.  Invalid input(s): "APPERANCEUR"     Imaging: No results found.   Medications:     apixaban  5 mg Oral BID   budesonide (PULMICORT) nebulizer solution  0.5 mg Nebulization BID   Chlorhexidine Gluconate Cloth  6 each Topical Daily   diltiazem  240 mg Oral Daily   docusate sodium   100 mg Oral BID   empagliflozin  10 mg Oral Daily   famotidine  40 mg Oral Daily   furosemide  40 mg Intravenous Q12H   ipratropium-albuterol  3 mL Nebulization BID   metoprolol tartrate  50 mg Oral BID   mometasone-formoterol  2 puff Inhalation BID   polyethylene glycol  17 g Oral Daily   predniSONE  20 mg Oral Q breakfast   acetaminophen, docusate sodium, ipratropium-albuterol, ondansetron (ZOFRAN) IV, polyethylene glycol  Assessment/ Plan:  [name] is a 62 y.o.  male  who was admitted to [facility] on 12/16/2022 for Atrial flutter (HCC) [I48.92] . Patient has a history of asthma, COPD, tobacco abuse who originally presented for right sided abdominal pain was found to be in rapid A-fib/flutter.    Acute Kidney Injury      - likely secondary to IV contrast exposure. May be a component of over diuresis as well.     - Creatinine remains at baseline. Great urine output   Lab Results  Component Value Date   CREATININE 0.84 12/21/2022   CREATININE 1.12 12/20/2022   CREATININE 1.74 (H) 12/19/2022          2. Hyperkalemia    - likely secondary to kidney injury and diet. Potassium 4.6 today. Will decrease Lokelma to daily.      Latest Ref Rng & Units 12/21/2022    3:22 AM 12/20/2022    4:24 PM 12/20/2022   12:23 PM  BMP  Glucose 70 - 99 mg/dL 080     BUN 6 - 20 mg/dL 41     Creatinine 2.23 - 1.24 mg/dL 3.61     Sodium 224 - 497 mmol/L 139     Potassium 3.5 - 5.1 mmol/L 4.5  4.6  4.6   Chloride 98 - 111 mmol/L 100     CO2 22 -  32 mmol/L 36     Calcium 8.9 - 10.3 mg/dL 8.5       Due to renal recovery, we will sign off at this time. Feel free to reconsult if needed.      LOS: 5 Lennox Leikam 1/24/202411:50 AM

## 2022-12-21 NOTE — Progress Notes (Signed)
  Progress Note   Patient: Kenneth Benson RXV:[id number] DOB: [birth year] DOA: 12/16/2022     5 DOS: the patient was seen and examined on 12/21/2022   Brief hospital course: 62 year old male with a known history of COPD, asthma, ongoing tobacco abuse, A-fib is admitted for rapid A-fib/flutter and acute hypoxic respiratory failure requiring BiPAP Patient was seen cardiology and intensivist, was placed on Cardizem and metoprolol for rate control.  He also developed acute kidney injury and hyperkalemia.  Renal function so far has normalized. 1/24.  Patient appears to be significantly volume overloaded, renal function has normalized.  Restart IV Lasix 40 mg twice a day.  Assessment and Plan:  *Paroxysmal atrial fibrillation with RVR ([place]) Patient is a followed by cardiology, on diltiazem and metoprolol, dose increased today.  Continue Eliquis for stroke prevention.     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 Moderate tricuspid regurgitation. Patient oxygenation is better, however, still has significant shortness of breath with minimal exertion.  Recheck BMP significant elevated.  Since patient has normalized in renal function, I will restart IV Lasix.  Monitor renal function closely.    COPD exacerbation (HCC) Continue oral steroids, condition seem to be improving.  Hyperkalemia AKI (acute kidney injury) ([place]) secondary to ATN. Acute kidney injury probably secondary to contrast and ATN.  Renal function is overall has normalized.  Potassium normalized.  Morbid obesity. Patient BMI is 36.74, patient has morbid obesity due to comorbidities with congestive heart failure.     Subjective:  Patient still has significant short of breath with minimal exertion.  Cough, nonproductive. He also has some wheezing.   Physical Exam: Vitals:   12/21/22 0422 12/21/22 0621 12/21/22 0809 12/21/22 1123  BP: 106/76 116/78 120/69  109/76  Pulse: (!) 113 83 61 (!) 107  Resp: (!) 24 (!) 21 (!) 22 20  Temp: 97.8 F (36.6 C) 97.8 F (36.6 C) 98.6 F (37 C) 97.8 F (36.6 C)  TempSrc:      SpO2: 98% 97% 94% 93%  Weight:      Height:       General exam: Appears calm and comfortable  Respiratory system: Significant decreased breathing sounds with wheezes.Marland Kitchen Respiratory effort normal. Cardiovascular system: S1 & S2 heard, RRR. No JVD, murmurs, rubs, gallops or clicks.  Gastrointestinal system: Abdomen is nondistended, soft and nontender. No organomegaly or masses felt. Normal bowel sounds heard. Central nervous system: Alert and oriented. No focal neurological deficits. Extremities: 2+ leg edema Skin: No rashes, lesions or ulcers Psychiatry: Judgement and insight appear normal. Mood & affect appropriate.   Data Reviewed:  Reviewed echo results, lab results.  Also reviewed recent chest x-ray results.  Family Communication: None  Disposition: Status is: Inpatient Remains inpatient appropriate because: Severity of disease, IV treatment.  Planned Discharge Destination: Home with Home Health    Time spent: 50 minutes  Author: Sharen Hones, MD 12/21/2022 2:07 PM  For on call review [email or web address].

## 2022-12-21 NOTE — Telephone Encounter (Signed)
Patient Advocate Encounter   Received notification that prior authorization for Jardiance 10MG  tablets is required.   PA submitted on 12/21/2022 Key [id number] Status is pending       Lyndel Safe, [place] Patient Advocate Specialist [place] Patient Advocate Team Direct Number: [phone]  Fax: [phone]

## 2022-12-22 DIAGNOSIS — I480 Paroxysmal atrial fibrillation: Secondary | ICD-10-CM | POA: Diagnosis not present

## 2022-12-22 DIAGNOSIS — J441 Chronic obstructive pulmonary disease with (acute) exacerbation: Secondary | ICD-10-CM | POA: Diagnosis not present

## 2022-12-22 DIAGNOSIS — J9601 Acute respiratory failure with hypoxia: Secondary | ICD-10-CM | POA: Diagnosis not present

## 2022-12-22 DIAGNOSIS — I5043 Acute on chronic combined systolic (congestive) and diastolic (congestive) heart failure: Secondary | ICD-10-CM | POA: Diagnosis not present

## 2022-12-22 LAB — LEGIONELLA PNEUMOPHILA SEROGP 1 UR AG: L. pneumophila Serogp 1 Ur Ag: NEGATIVE

## 2022-12-22 LAB — CBC
HCT: 45.1 % (ref 39.0–52.0)
Hemoglobin: 14 g/dL (ref 13.0–17.0)
MCH: 29.1 pg (ref 26.0–34.0)
MCHC: 31 g/dL (ref 30.0–36.0)
MCV: 93.8 fL (ref 80.0–100.0)
Platelets: 223 10*3/uL (ref 150–400)
RBC: 4.81 MIL/uL (ref 4.22–5.81)
RDW: 15.2 % (ref 11.5–15.5)
WBC: 7.3 10*3/uL (ref 4.0–10.5)
nRBC: 0 % (ref 0.0–0.2)

## 2022-12-22 LAB — BASIC METABOLIC PANEL
Anion gap: 8 (ref 5–15)
BUN: 36 mg/dL — ABNORMAL HIGH (ref 6–20)
CO2: 42 mmol/L — ABNORMAL HIGH (ref 22–32)
Calcium: 8.6 mg/dL — ABNORMAL LOW (ref 8.9–10.3)
Chloride: 90 mmol/L — ABNORMAL LOW (ref 98–111)
Creatinine, Ser: 1.1 mg/dL (ref 0.61–1.24)
GFR, Estimated: >60 mL/min (ref >60)
Glucose, Bld: 86 mg/dL (ref 70–99)
Potassium: 3.6 mmol/L (ref 3.5–5.1)
Sodium: 140 mmol/L (ref 135–145)

## 2022-12-22 LAB — MAGNESIUM: Magnesium: 2.3 mg/dL (ref 1.7–2.4)

## 2022-12-22 LAB — MYCOPLASMA PNEUMONIAE ANTIBODY, IGM: Mycoplasma pneumo IgM: <770 U/mL (ref 0–769)

## 2022-12-22 MED ORDER — FUROSEMIDE 10 MG/ML IJ SOLN
40.0000 mg | Freq: Every day | INTRAMUSCULAR | Status: DC
  Administered 2022-12-22 – 2022-12-24 (×3): 40 mg via INTRAVENOUS
  Filled 2022-12-22 (×3): qty 4

## 2022-12-22 MED ORDER — POTASSIUM CHLORIDE CRYS ER 20 MEQ PO TBCR
40.0000 meq | EXTENDED_RELEASE_TABLET | Freq: Once | ORAL | Status: AC
  Administered 2022-12-22: 40 meq via ORAL
  Filled 2022-12-22: qty 2

## 2022-12-22 MED ORDER — METOPROLOL TARTRATE 50 MG PO TABS
50.0000 mg | ORAL_TABLET | Freq: Two times a day (BID) | ORAL | Status: DC
  Administered 2022-12-22 – 2022-12-24 (×4): 50 mg via ORAL
  Filled 2022-12-22 (×4): qty 1

## 2022-12-22 NOTE — Progress Notes (Signed)
Patient placed on BIPAP for HS. The patient has a very large beard and it is difficult to keep the leak at a moderate level, RN made aware of possible constant alarm. Patient tolerating well at this time.

## 2022-12-22 NOTE — Plan of Care (Signed)
  Problem: Clinical Measurements: Goal: Respiratory complications will improve Outcome: Progressing   Problem: Clinical Measurements: Goal: Cardiovascular complication will be avoided Outcome: Progressing   Problem: Activity: Goal: Risk for activity intolerance will decrease Outcome: Progressing   Problem: Coping: Goal: Level of anxiety will decrease Outcome: Progressing   Problem: Pain Managment: Goal: General experience of comfort will improve Outcome: Progressing   Problem: Safety: Goal: Ability to remain free from injury will improve Outcome: Progressing

## 2022-12-22 NOTE — TOC Progression Note (Signed)
Transition of Care [facility]) - Progression Note    Patient Details  Name: Kenneth Benson MRN: [id number] Date of Birth: 03/23/1961  [facility] of [facility] CM/SW Contact  Laurena Slimmer, RN Phone Number: 12/22/2022, 1:10 PM  Clinical Narrative:    Case reviewed for needs and changes in disposition.         Expected Discharge Plan and Services                                               Social Determinants of Health (SDOH) Interventions SDOH Screenings   Food Insecurity: No Food Insecurity (12/18/2022)  Housing: Low Risk  (12/18/2022)  Transportation Needs: Unmet Transportation Needs (12/18/2022)  Utilities: Not At Risk (12/18/2022)  Tobacco Use: High Risk (12/18/2022)    Readmission Risk Interventions     No data to display

## 2022-12-22 NOTE — Progress Notes (Signed)
[facility] Cardiology  Patient ID: Kenneth Benson MRN: [id number] DOB/AGE: 1961-06-12 62 y.o.   Admit date: 12/16/2022 Referring Physician Manuella Ghazi Primary Physician none Primary Cardiologist Corky Sox Reason for Consultation atrial fibrillation  HPI: Kenneth Benson is a 62 YOM with a PMH of asthma, COPD, ongoing tobacco use, paroxysmal AF on Eliquis who presented to [facility] ED 12/16/2022 with a chief complaint of right-sided abdominal pain.  There was concern for mesenteric ischemia however CT was unremarkable with exception of large bilateral inguinal hernia.  The patient was noted to be in atrial fibrillation with rapid ventricular rate at 153 bpm.  Patient was treated with Eliquis, metoprolol tartrate 25 mg twice daily, and Cardizem bolus and infusion.  Initially required BiPAP, now weaned to [facility] and is being treated for a COPD exacerbation + diuresed for AoCHF.   Interval history: -feels good, eager to go home - no shortness of breath or chest pain, or palpitations - diuresing well, in AF with much better rate control in the 80s-90s    Vitals:   12/22/22 0508 12/22/22 0810 12/22/22 1155 12/22/22 1158  BP: 113/76 118/81 103/71 103/77  Pulse: 96 (!) 103  96  Resp: 20 20  16   Temp: 97.9 F (36.6 C) 98.3 F (36.8 C)  97.8 F (36.6 C)  TempSrc: Oral Oral  Oral  SpO2: 97% 94%  93%  Weight:      Height:         Intake/Output Summary (Last 24 hours) at 12/22/2022 1418 Last data filed at 12/22/2022 0857 Gross per 24 hour  Intake 600 ml  Output 8320 ml  Net -7720 ml       PHYSICAL EXAM  General: ill appearing caucasian male, sitting upright in recliner in PCU  HEENT:  Normocephalic and atraumatic Neck:  No JVD.  Lungs: normal respiratory effort on 4LNC, decreased breath sounds without appreciable wheezing Heart: irregularly irregular with mostly controlled rate . Normal S1 and S2 without gallops or murmurs.  Abdomen: nondistended appearing with excess adiposity  Msk:   Normal strength and  tone for age. Extremities: No clubbing, cyanosis. Trace bilateral  edema.   Neuro: Alert and oriented X 3. Psych:  Good affect, responds appropriately   LABS: Basic Metabolic Panel: Recent Labs    12/21/22 0322 12/21/22 1554 12/22/22 0504  NA 139  --  140  K 4.5 4.4 3.6  CL 100  --  90*  CO2 36*  --  42*  GLUCOSE 107*  --  86  BUN 41*  --  36*  CREATININE 0.84  --  1.10  CALCIUM 8.5*  --  8.6*  MG  --  2.2 2.3    Liver Function Tests: No results for input(s): "AST", "ALT", "ALKPHOS", "BILITOT", "PROT", "ALBUMIN" in the last 72 hours.  No results for input(s): "LIPASE", "AMYLASE" in the last 72 hours.  CBC: Recent Labs    12/21/22 0322 12/22/22 0504  WBC 6.2 7.3  HGB 13.5 14.0  HCT 44.2 45.1  MCV 96.5 93.8  PLT 199 223    Cardiac Enzymes: No results for input(s): "CKTOTAL", "CKMB", "CKMBINDEX", "TROPONINI" in the last 72 hours. BNP: Invalid input(s): "POCBNP" D-Dimer: No results for input(s): "DDIMER" in the last 72 hours. Hemoglobin A1C: No results for input(s): "HGBA1C" in the last 72 hours. Fasting Lipid Panel: No results for input(s): "CHOL", "HDL", "LDLCALC", "TRIG", "CHOLHDL", "LDLDIRECT" in the last 72 hours.  Thyroid Function Tests: No results for input(s): "TSH", "T4TOTAL", "T3FREE", "THYROIDAB" in the last 72  hours.  Invalid input(s): "FREET3"  Anemia Panel: No results for input(s): "VITAMINB12", "FOLATE", "FERRITIN", "TIBC", "IRON", "RETICCTPCT" in the last 72 hours.  No results found.   Echo 01/29/2022: EF 50-55%, trivial MR  TELEMETRY: atrial Fibrillation at 110s-130s this am  ASSESSMENT AND PLAN:  Principal Problem:   Paroxysmal atrial fibrillation with RVR (HCC) Active Problems:   COPD exacerbation (HCC)   Acute hypoxic respiratory failure (HCC)   Acute on chronic combined systolic and diastolic CHF (congestive heart failure) (HCC)   AKI (acute kidney injury) (HCC)   Hyperkalemia   Morbid obesity (HCC)   Moderate tricuspid  regurgitation   1. Atrial fibrillation with rapid ventricular rate, started on Eliquis for stroke prevention, and metoprolol  tartrate and Cardizem bolus and infusion for rate and rhythm control.  Cardizem infusion transitioned to short acting diltiazem  2.  Acute hypoxic respiratory failure, secondary to COPD exacerbation + AoCHF 3.  new HFmrEF , chest x-ray revealing pulmonary interstitial edema, echo 12/18/2022 revealed EF 45-50% without RWMA's, moderate MR, moderate TR. Somewhat hypervolemic appearing, BNP up 1/24 at 1985, diuresing well 4.   AKI, felt to be from IV contrast vs overdiuresis,  5.  Hyperkalemia, s/p lokelma  6.  Tobacco abuse   Recommendations   1.  Agree with current therapy 2.  Continue Eliquis for stroke prevention 3.  continue diltiazem 90mg  q6h today, hopefully consolidate to BID dosing tomorrow 4.  Continue metoprolol tartrate 50 mg twice daily.  5.  continue jardiance 10mg  daily (appreciate pharmacy working on prior [place]). Further GDMT with ARB/ARNI, MRA limited by soft BP 6.  Agree with diuresis, closely monitor renal function 7.  Strongly encouraged complete smoking cessation. He is motivated to quit  This patient's plan of care was discussed and created with Dr. Saralyn Pilar and he is in agreement.    Tristan Schroeder, PA-C 12/22/2022 2:18 PM

## 2022-12-22 NOTE — Plan of Care (Signed)
  Problem: Education: Goal: Knowledge of General Education information will improve Description: Including pain rating scale, medication(s)/side effects and non-pharmacologic comfort measures Outcome: Progressing   Problem: Clinical Measurements: Goal: Respiratory complications will improve Outcome: Progressing   Problem: Clinical Measurements: Goal: Cardiovascular complication will be avoided Outcome: Progressing   Problem: Coping: Goal: Level of anxiety will decrease Outcome: Progressing   Problem: Pain Managment: Goal: General experience of comfort will improve Outcome: Progressing   Problem: Safety: Goal: Ability to remain free from injury will improve Outcome: Progressing

## 2022-12-22 NOTE — Progress Notes (Signed)
  Progress Note   Patient: ABDURRAHMAN PETERSHEIM Benson:[id number] DOB: 03/06/1961 DOA: 12/16/2022     6 DOS: the patient was seen and examined on 12/22/2022   Brief hospital course: 62 year old male with a known history of COPD, asthma, ongoing tobacco abuse, A-fib is admitted for rapid A-fib/flutter and acute hypoxic respiratory failure requiring BiPAP Patient was seen cardiology and intensivist, was placed on Cardizem and metoprolol for rate control.  He also developed acute kidney injury and hyperkalemia.  Renal function so far has normalized. 1/24.  Patient appears to be significantly volume overloaded, renal function has normalized.  Restart IV Lasix 40 mg twice a day.  Assessment and Plan: *Paroxysmal atrial fibrillation with RVR (HCC) HR better controlled.  Continue beta-blocker and calcium channel blocker.  Continue Eliquis.     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 Moderate tricuspid regurgitation. Feels much better today, oxygenation is gradually improving.  Currently on 2 L oxygen. He had 8 L of urine after giving Lasix, renal function still normal.  I will decrease furosemide to once a day.  Follow-up electrolytes and renal function. Patient was still needing BiPAP at nighttime yesterday, will obtain overnight oximetry to determine if he needs BiPAP at home.    COPD exacerbation ([place]) Continue wean steroids.   Hyperkalemia AKI (acute kidney injury) ([place]) secondary to ATN. Acute kidney injury probably secondary to contrast and ATN.  Renal function is overall has normalized.  Potassium normalized.   Morbid obesity. Patient BMI is 36.74, patient has morbid obesity due to comorbidities with congestive heart failure.      Subjective:  Patient is feeling much better today, heart rate is now better controlled, oxygen on 2 L.  still has cough with white mucus.  Shortness of breath has improved.  Physical  Exam: Vitals:   12/22/22 0508 12/22/22 0810 12/22/22 1155 12/22/22 1158  BP: 113/76 118/81 103/71 103/77  Pulse: 96 (!) 103  96  Resp: 20 20  16   Temp: 97.9 F (36.6 C) 98.3 F (36.8 C)  97.8 F (36.6 C)  TempSrc: Oral Oral  Oral  SpO2: 97% 94%  93%  Weight:      Height:       General exam: Appears calm and comfortable  Respiratory system: Decreased breath sounds.Marland Kitchen Respiratory effort normal. Cardiovascular system: Irregular. No JVD, murmurs, rubs, gallops or clicks. Gastrointestinal system: Abdomen is nondistended, soft and nontender. No organomegaly or masses felt. Normal bowel sounds heard. Central nervous system: Alert and oriented. No focal neurological deficits. Extremities: Edema much improved. Skin: No rashes, lesions or ulcers Psychiatry: Judgement and insight appear normal. Mood & affect appropriate.   Data Reviewed:  Lab results reviewed.  Family Communication: None  Disposition: Status is: Inpatient Remains inpatient appropriate because: Severity of disease, IV treatment.  Planned Discharge Destination: Home    Time spent: 35 minutes  Author: Sharen Hones, MD 12/22/2022 1:53 PM  For on call review [email or web address].

## 2022-12-22 NOTE — Progress Notes (Signed)
Overnight pulse oximetry study initiated at this time, patient on 2L [place] with SAT 91%.

## 2022-12-22 NOTE — Progress Notes (Signed)
Occupational Therapy Treatment Patient Details Name: Kenneth Benson MRN: [id number] DOB: 06/10/1961 Today's Date: 12/22/2022   History of present illness Pt is a 62 y.o. 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  Pt noted with tachycardia and wheezing/difficulty breathing.  Imaging showing large B inguinal hernias.  Pt admitted with atrial flutter, acute hypoxic respiratory failure, COPD exacerbation (initially on BiPAP), and acute on chronic diastolic CHF.  PMH includes COPD, tobacco use, asthma, elevated BMI, a-fib.   OT comments  Upon entering the room, pt seated in recliner chair and agreeable to OT intervention. Pt's O2 saturation on 4Ls at rest was 96%. Pt endorses taking it off  during the day at times. O2 removed for ambulation and pt placed on RA. He ambulates 200' without use of AD and supervision for safety. O2 did drop to 84% at end of mobility and he was placed on 2Ls and able to quickly return to 93%. RN notified of pt being decreased to 2Ls during session. Pt did not feel SOB during tasks. He declines self care tasks this session and RN remains in room to take further vitals at this time. Pt continues to benefit from OT intervention.    Recommendations for follow up therapy are one component of a multi-disciplinary discharge planning process, led by the attending physician.  Recommendations may be updated based on patient status, additional functional criteria and insurance authorization.    Follow Up Recommendations  No OT follow up     Assistance Recommended at Discharge Intermittent Supervision/Assistance  Patient can return home with the following  A little help with walking and/or transfers;A little help with bathing/dressing/bathroom;Help with stairs or ramp for entrance;Assist for transportation;Assistance with cooking/housework   Equipment Recommendations  None  recommended by OT       Precautions / Restrictions Precautions Precautions: Fall       Mobility Bed Mobility               General bed mobility comments: seated in recliner chair at beginning and end of session    Transfers Overall transfer level: Needs assistance Equipment used: None Transfers: Sit to/from Stand Sit to Stand: Supervision                 Balance Overall balance assessment: Needs assistance Sitting-balance support: No upper extremity supported, Feet supported Sitting balance-Leahy Scale: Normal     Standing balance support: During functional activity, No upper extremity supported Standing balance-Leahy Scale: Good                             ADL either performed or assessed with clinical judgement    Extremity/Trunk Assessment Upper Extremity Assessment Upper Extremity Assessment: Overall WFL for tasks assessed   Lower Extremity Assessment Lower Extremity Assessment: Generalized weakness        Vision Patient Visual Report: No change from baseline            Cognition Arousal/Alertness: Awake/alert Behavior During Therapy: WFL for tasks assessed/performed Overall Cognitive Status: Within Functional Limits for tasks assessed                                                     Pertinent Vitals/ Pain  Pain Assessment Pain Assessment: No/denies pain         Frequency  Min 2X/week        Progress Toward Goals  OT Goals(current goals can now be found in the care plan section)  Progress towards OT goals: Progressing toward goals  Acute Rehab OT Goals Patient Stated Goal: to return home OT Goal Formulation: With patient Time For Goal Achievement: 01/02/23 Potential to Achieve Goals: [place] Discharge plan remains appropriate;Frequency remains appropriate       AM-PAC OT "6 Clicks" Daily Activity     Outcome Measure   Help from another person eating meals?: None Help from  another person taking care of personal grooming?: None Help from another person toileting, which includes using toliet, bedpan, or urinal?: None Help from another person bathing (including washing, rinsing, drying)?: None Help from another person to put on and taking off regular upper body clothing?: None Help from another person to put on and taking off regular lower body clothing?: None 6 Click Score: 24    End of Session Equipment Utilized During Treatment: Rolling walker (2 wheels)  OT Visit Diagnosis: Unsteadiness on feet (R26.81);Muscle weakness (generalized) (M62.81)   Activity Tolerance Patient tolerated treatment well   Patient Left in chair;with nursing/sitter in room;with call bell/phone within reach;with chair alarm set   Nurse Communication Mobility status;Other (comment) (O2 placed on 2Ls)        Time: 3762-8315 OT Time Calculation (min): 19 min  Charges: OT General Charges $OT Visit: 1 Visit OT Treatments $Therapeutic Activity: 8-22 mins  Darleen Crocker, MS, OTR/L , CBIS ascom [phone]  12/22/22, 2:22 PM

## 2022-12-23 DIAGNOSIS — I5043 Acute on chronic combined systolic (congestive) and diastolic (congestive) heart failure: Secondary | ICD-10-CM | POA: Diagnosis not present

## 2022-12-23 DIAGNOSIS — I480 Paroxysmal atrial fibrillation: Secondary | ICD-10-CM | POA: Diagnosis not present

## 2022-12-23 DIAGNOSIS — J9601 Acute respiratory failure with hypoxia: Secondary | ICD-10-CM | POA: Diagnosis not present

## 2022-12-23 LAB — BASIC METABOLIC PANEL
Anion gap: 4 — ABNORMAL LOW (ref 5–15)
BUN: 33 mg/dL — ABNORMAL HIGH (ref 6–20)
CO2: 42 mmol/L — ABNORMAL HIGH (ref 22–32)
Calcium: 8.3 mg/dL — ABNORMAL LOW (ref 8.9–10.3)
Chloride: 92 mmol/L — ABNORMAL LOW (ref 98–111)
Creatinine, Ser: 1.08 mg/dL (ref 0.61–1.24)
GFR, Estimated: >60 mL/min (ref >60)
Glucose, Bld: 153 mg/dL — ABNORMAL HIGH (ref 70–99)
Potassium: 3.6 mmol/L (ref 3.5–5.1)
Sodium: 138 mmol/L (ref 135–145)

## 2022-12-23 LAB — MAGNESIUM: Magnesium: 2.3 mg/dL (ref 1.7–2.4)

## 2022-12-23 MED ORDER — DILTIAZEM HCL ER COATED BEADS 180 MG PO CP24
360.0000 mg | ORAL_CAPSULE | Freq: Every day | ORAL | Status: DC
  Administered 2022-12-23 – 2022-12-24 (×2): 360 mg via ORAL
  Filled 2022-12-23 (×2): qty 2

## 2022-12-23 NOTE — Progress Notes (Signed)
[facility] Cardiology  Patient ID: Kenneth Benson MRN: [id number] DOB/AGE: [birth year] 61 y.o.   Admit date: 12/16/2022 Referring Physician Manuella Ghazi Primary Physician none Primary Cardiologist Corky Sox Reason for Consultation atrial fibrillation  HPI: Kenneth Benson is a 62 YOM with a PMH of asthma, COPD, ongoing tobacco use, paroxysmal AF on Eliquis who presented to [facility] ED 12/16/2022 with a chief complaint of right-sided abdominal pain.  There was concern for mesenteric ischemia however CT was unremarkable with exception of large bilateral inguinal hernia.  The patient was noted to be in atrial fibrillation with rapid ventricular rate at 153 bpm.  Patient was treated with Eliquis, metoprolol tartrate 25 mg twice daily, and Cardizem bolus and infusion.  Initially required BiPAP, now weaned to [facility] and is being treated for a COPD exacerbation + diuresed for AoCHF.   Interval history: - feels good, eager to go home - no shortness of breath or chest pain, or palpitations - rate controlled AF on tele in the 90s.     Vitals:   12/23/22 0025 12/23/22 0508 12/23/22 0631 12/23/22 0711  BP: 118/77 105/74 116/78   Pulse: (!) 110 94 91   Resp: 20 18 18    Temp: 97.8 F (36.6 C) 97.6 F (36.4 C) 97.7 F (36.5 C)   TempSrc:  Oral Oral   SpO2: 92% 92% 93% 91%  Weight:      Height:         Intake/Output Summary (Last 24 hours) at 12/23/2022 0810 Last data filed at 12/23/2022 0510 Gross per 24 hour  Intake 477 ml  Output 3825 ml  Net -3348 ml       PHYSICAL EXAM  General: ill appearing caucasian male, sitting upright in recliner in PCU  HEENT:  Normocephalic and atraumatic Neck:  No JVD.  Lungs: normal respiratory effort on 2LNC, decreased breath sounds with trace expiratory wheezing Heart: irregularly irregular controlled rate . Normal S1 and S2 without gallops or murmurs.  Abdomen: nondistended appearing with excess adiposity  Msk:   Normal strength and tone for age. Extremities: No clubbing,  cyanosis. Trace left lower extremity edema.   Neuro: Alert and oriented X 3. Psych:  Good affect, responds appropriately   LABS: Basic Metabolic Panel: Recent Labs    12/22/22 0504 12/23/22 0441  NA 140 138  K 3.6 3.6  CL 90* 92*  CO2 42* 42*  GLUCOSE 86 153*  BUN 36* 33*  CREATININE 1.10 1.08  CALCIUM 8.6* 8.3*  MG 2.3 2.3    Liver Function Tests: No results for input(s): "AST", "ALT", "ALKPHOS", "BILITOT", "PROT", "ALBUMIN" in the last 72 hours.  No results for input(s): "LIPASE", "AMYLASE" in the last 72 hours.  CBC: Recent Labs    12/21/22 0322 12/22/22 0504  WBC 6.2 7.3  HGB 13.5 14.0  HCT 44.2 45.1  MCV 96.5 93.8  PLT 199 223    Cardiac Enzymes: No results for input(s): "CKTOTAL", "CKMB", "CKMBINDEX", "TROPONINI" in the last 72 hours. BNP: Invalid input(s): "POCBNP" D-Dimer: No results for input(s): "DDIMER" in the last 72 hours. Hemoglobin A1C: No results for input(s): "HGBA1C" in the last 72 hours. Fasting Lipid Panel: No results for input(s): "CHOL", "HDL", "LDLCALC", "TRIG", "CHOLHDL", "LDLDIRECT" in the last 72 hours.  Thyroid Function Tests: No results for input(s): "TSH", "T4TOTAL", "T3FREE", "THYROIDAB" in the last 72 hours.  Invalid input(s): "FREET3"  Anemia Panel: No results for input(s): "VITAMINB12", "FOLATE", "FERRITIN", "TIBC", "IRON", "RETICCTPCT" in the last 72 hours.  No results found.  Echo 01/29/2022: EF 50-55%, trivial MR  TELEMETRY: atrial Fibrillation at 110s-130s this am  ASSESSMENT AND PLAN:  Principal Problem:   Paroxysmal atrial fibrillation with RVR (HCC) Active Problems:   COPD exacerbation (HCC)   Acute hypoxic respiratory failure (HCC)   Acute on chronic combined systolic and diastolic CHF (congestive heart failure) (HCC)   AKI (acute kidney injury) (HCC)   Hyperkalemia   Morbid obesity (HCC)   Moderate tricuspid regurgitation   1. Atrial fibrillation with rapid ventricular rate, restarted on Eliquis for  stroke prevention, and metoprolol  tartrate, s;p initial Cardizem bolus and infusion for rate and rhythm control.  2.  Acute hypoxic respiratory failure, secondary to COPD exacerbation + AoCHF 3.  new HFmrEF , chest x-ray revealing pulmonary interstitial edema, echo 12/18/2022 revealed EF 45-50% without RWMA's, moderate MR, moderate TR. BNP up 1/24 at 1985, euvolemic on exam today. 4.   AKI, felt to be from IV contrast vs overdiuresis,  5.  Hyperkalemia, s/p lokelma, resolved. 6.  Tobacco abuse   Recommendations   1.  Agree with current therapy 2.  Continue Eliquis for stroke prevention 3.  agree with consolidating cardizem to 360mg  CD 4.  Continue metoprolol tartrate 50 mg twice daily.  5.  continue jardiance 10mg  daily (appreciate pharmacy working on prior [place]). Further GDMT with ARB/ARNI, MRA limited by soft BP 6.  No further diuresis 7.  Strongly encouraged complete smoking cessation. He is motivated to quit  [facility] for discharge from a cardiac standpoint. Will arrange for follow up with Dr. Saralyn Pilar in 1-2 weeks.  This patient's plan of care was discussed and created with Dr. Clayborn Bigness and he is in agreement.    Tristan Schroeder, PA-C 12/23/2022 8:10 AM

## 2022-12-23 NOTE — Progress Notes (Signed)
Mobility Specialist - Progress Note  During mobility: SpO2(91) Post-mobility: MNO1(77-11)     12/23/22 1500  Mobility  Activity Ambulated with assistance in hallway;Stood at bedside;Dangled on edge of bed  Level of Assistance Standby assist, set-up cues, supervision of patient - no hands on  Assistive Device None  Distance Ambulated (ft) 320 ft  Activity Response Tolerated well  Mobility Referral Yes  $Mobility charge 1 Mobility   Pt resting in bed on 2L upon entry. Pt STS and ambulates around NS with no AD SBA. Pt returned to bed and left with needs in reach. Bed alarm activated.   Loma Sender Mobility Specialist 12/23/22, 3:28 PM

## 2022-12-23 NOTE — Progress Notes (Addendum)
  Progress Note   Patient: Kenneth Benson OQH:[id number] DOB: 1960/12/21 DOA: 12/16/2022     7 DOS: the patient was seen and examined on 12/23/2022   Brief hospital course: 62 year old male with a known history of COPD, asthma, ongoing tobacco abuse, A-fib is admitted for rapid A-fib/flutter and acute hypoxic respiratory failure requiring BiPAP Patient was seen cardiology and intensivist, was placed on Cardizem and metoprolol for rate control.  He also developed acute kidney injury and hyperkalemia.  Renal function so far has normalized. 1/24.  Patient appears to be significantly volume overloaded, renal function has normalized.  Restart IV Lasix 40 mg twice a day.  Assessment and Plan:  Paroxysmal atrial fibrillation with RVR (HCC) Heart rate much better, continue beta-blocker and calcium channel blocker, continue Eliquis.    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 Moderate tricuspid regurgitation. Patient is diuresing well, volume status much improved.  Will continue with oxygen. Performed overnight oximetry, patient had 29% of the time with desaturation even on 2 L oxygen.  He used the BiPAP again last night, will need home BiPAP.  Discussed with Education officer, museum, will try to set up before discharge. Since he is still in the hospital, I will diurese him more as his renal function is still normal.    COPD exacerbation ([place]) No longer has any bronchospasm, will discontinue steroids.   Hyperkalemia AKI (acute kidney injury) ([place]) secondary to ATN. Acute kidney injury probably secondary to contrast and ATN.  Renal function is overall has normalized.  Potassium normalized.   Morbid obesity. Patient BMI is 36.74, patient has morbid obesity due to comorbidities with congestive heart failure.   Patient has Chronic hypercapnic respiratory failure secondary to COPD. Bi-Level PAP has been tried and failed in clinical  setting. Patient is in need of Non-invasive ventilator for discharge from hospital. This will ensure targeted Tidal volume is achieved and subsequently reducing exacerbations and hospitalizations; ultimately sustaining life.   Subjective:  Patient was on BiPAP last night.  He feels better this morning, not sickly short of breath.  Still has a cough with white mucus.  Physical Exam: Vitals:   12/23/22 0508 12/23/22 0631 12/23/22 0711 12/23/22 0818  BP: 105/74 116/78  119/83  Pulse: 94 91  85  Resp: 18 18  20   Temp: 97.6 F (36.4 C) 97.7 F (36.5 C)  97.7 F (36.5 C)  TempSrc: Oral Oral    SpO2: 92% 93% 91% 93%  Weight:      Height:       General exam: Appears calm and comfortable  Respiratory system: Much improved, with good air movement. Respiratory effort normal. Cardiovascular system: S1 & S2 heard, RRR. No JVD, murmurs, rubs, gallops or clicks. No pedal edema. Gastrointestinal system: Abdomen is nondistended, soft and nontender. No organomegaly or masses felt. Normal bowel sounds heard. Central nervous system: Alert and oriented. No focal neurological deficits. Extremities: Leg edema resolved. Skin: No rashes, lesions or ulcers Psychiatry: Judgement and insight appear normal. Mood & affect appropriate.   Data Reviewed:  Lab results reviewed.  Family Communication: None  Disposition: Status is: Inpatient Remains inpatient appropriate because: Severity of disease, IV treatment.  Pending BiPAP.  Planned Discharge Destination: Home with Home Health    Time spent: 35 minutes  Author: Sharen Hones, MD 12/23/2022 9:58 AM  For on call review [email or web address].

## 2022-12-23 NOTE — Progress Notes (Signed)
Physical Therapy Treatment Patient Details Name: Kenneth Benson MRN: [id number] DOB: [birth year] Today's Date: 12/23/2022   History of Present Illness Pt is a 62 y.o. 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  Pt noted with tachycardia and wheezing/difficulty breathing.  Imaging showing large B inguinal hernias.  Pt admitted with atrial flutter, acute hypoxic respiratory failure, COPD exacerbation (initially on BiPAP), and acute on chronic diastolic CHF.  PMH includes COPD, tobacco use, asthma, elevated BMI, a-fib.    PT Comments    Pt resting in bed upon PT arrival; agreeable to walking (pt declined LE ex's though).  During session pt modified independent with bed mobility; modified independent with transfers; and CGA to ambulate 200 feet x2 (no AD use; sitting rest break between ambulation trials).  Pt noted with mild SOB with ambulation (vc's given for pursed lip breathing; pt reports being a mouth breather typically).  O2 sats 86-87% on 2 L O2 after 1st ambulation trial and O2 sats 89% on 3 L O2 after 2nd ambulation trial; O2 sats 92% on 2 L O2 at rest beginning/end of session.  Will continue to focus on endurance and progressive functional mobility during hospitalization.     Recommendations for follow up therapy are one component of a multi-disciplinary discharge planning process, led by the attending physician.  Recommendations may be updated based on patient status, additional functional criteria and insurance authorization.  Follow Up Recommendations  No PT follow up     Assistance Recommended at Discharge PRN  Patient can return home with the following A little help with bathing/dressing/bathroom;Assistance with cooking/housework;Assist for transportation;Help with stairs or ramp for entrance   Equipment Recommendations  None recommended by PT    Recommendations for Other Services        Precautions / Restrictions Precautions Precautions: Fall Restrictions Weight Bearing Restrictions: No     Mobility  Bed Mobility Overal bed mobility: Modified Independent             General bed mobility comments: Semi-supine to/from sitting without any noted difficulties    Transfers Overall transfer level: Modified independent Equipment used: None Transfers: Sit to/from Stand Sit to Stand: Modified independent (Device/Increase time)           General transfer comment: steady safe transfers standing from bed x2 trials (use of UE's to push off of bed)    Ambulation/Gait Ambulation/Gait assistance: Min guard Gait Distance (Feet):  (200 feet x2) Assistive device: None   Gait velocity: decreased     General Gait Details: steady step through gait pattern; vc's for pursed lip breathing   Stairs             Wheelchair Mobility    Modified Rankin (Stroke Patients Only)       Balance Overall balance assessment: Needs assistance Sitting-balance support: No upper extremity supported, Feet supported Sitting balance-Leahy Scale: Normal Sitting balance - Comments: steady sitting reaching outside BOS   Standing balance support: During functional activity, No upper extremity supported Standing balance-Leahy Scale: Good Standing balance comment: steady ambulating without UE support                            Cognition Arousal/Alertness: Awake/alert Behavior During Therapy: WFL for tasks assessed/performed Overall Cognitive Status: Within Functional Limits for tasks assessed  Exercises      General Comments  Nursing cleared pt for participation in physical therapy.  Pt agreeable to PT session.      Pertinent Vitals/Pain Pain Assessment Pain Assessment: No/denies pain HR WFL during sessions activities.    Home Living                          Prior Function             PT Goals (current goals can now be found in the care plan section) Acute Rehab PT Goals Patient Stated Goal: to go home PT Goal Formulation: With patient Time For Goal Achievement: 01/03/23 Potential to Achieve Goals: Good Progress towards PT goals: Progressing toward goals    Frequency    Min 2X/week      PT Plan Current plan remains appropriate    Co-evaluation              AM-PAC PT "6 Clicks" Mobility   Outcome Measure  Help needed turning from your back to your side while in a flat bed without using bedrails?: None Help needed moving from lying on your back to sitting on the side of a flat bed without using bedrails?: None Help needed moving to and from a bed to a chair (including a wheelchair)?: None Help needed standing up from a chair using your arms (e.g., wheelchair or bedside chair)?: None Help needed to walk in hospital room?: A Little Help needed climbing 3-5 steps with a railing? : A Little 6 Click Score: 22    End of Session Equipment Utilized During Treatment: Gait belt;Oxygen (2-3 L O2) Activity Tolerance: Patient tolerated treatment well Patient left: in bed;with call bell/phone within reach;with bed alarm set Nurse Communication: Mobility status;Precautions;Other (comment) (pt's O2 sats and O2 needs during session) PT Visit Diagnosis: Other abnormalities of gait and mobility (R26.89);Muscle weakness (generalized) (M62.81)     Time: 9702-6378 PT Time Calculation (min) (ACUTE ONLY): 19 min  Charges:  $Therapeutic Exercise: 8-22 mins                     Leitha Bleak, PT 12/23/22, 10:33 AM

## 2022-12-23 NOTE — TOC Progression Note (Addendum)
Transition of Care [facility]) - Progression Note    Patient Details  Name: Kenneth Benson MRN: [id number] Date of Birth: 08/15/1961  Transition of Care [facility]) CM/SW Contact  Laurena Slimmer, RN Phone Number: 12/23/2022, 9:58 AM  Clinical Narrative:    Per MD patient requires BIPAP.  Attempt to reach [place] from [place]. Left a message with patient's information and request for BIPAP.   Retrieved a call from [place]. Referral  received. Patient case been reviewed for processing.    Spoke with Caryl Pina from [place]. Due to patient having Medicaid processing make take 4-5 days. Caryl Pina reports based on oximetry and diagnosis patient may benefit more from NIV MD provided update and provided number for [facility] @ 252 [phone].        Expected Discharge Plan and Services                                               Social Determinants of Health (SDOH) Interventions SDOH Screenings   Food Insecurity: No Food Insecurity (12/18/2022)  Housing: Low Risk  (12/18/2022)  Transportation Needs: Unmet Transportation Needs (12/18/2022)  Utilities: Not At Risk (12/18/2022)  Tobacco Use: High Risk (12/18/2022)    Readmission Risk Interventions     No data to display

## 2022-12-24 DIAGNOSIS — N179 Acute kidney failure, unspecified: Secondary | ICD-10-CM | POA: Diagnosis not present

## 2022-12-24 DIAGNOSIS — J9601 Acute respiratory failure with hypoxia: Secondary | ICD-10-CM | POA: Diagnosis not present

## 2022-12-24 DIAGNOSIS — I480 Paroxysmal atrial fibrillation: Secondary | ICD-10-CM | POA: Diagnosis not present

## 2022-12-24 DIAGNOSIS — I5043 Acute on chronic combined systolic (congestive) and diastolic (congestive) heart failure: Secondary | ICD-10-CM | POA: Diagnosis not present

## 2022-12-24 LAB — BASIC METABOLIC PANEL
Anion gap: 8 (ref 5–15)
BUN: 27 mg/dL — ABNORMAL HIGH (ref 6–20)
CO2: 36 mmol/L — ABNORMAL HIGH (ref 22–32)
Calcium: 8.3 mg/dL — ABNORMAL LOW (ref 8.9–10.3)
Chloride: 95 mmol/L — ABNORMAL LOW (ref 98–111)
Creatinine, Ser: 1.02 mg/dL (ref 0.61–1.24)
GFR, Estimated: >60 mL/min (ref >60)
Glucose, Bld: 140 mg/dL — ABNORMAL HIGH (ref 70–99)
Potassium: 3.8 mmol/L (ref 3.5–5.1)
Sodium: 139 mmol/L (ref 135–145)

## 2022-12-24 LAB — MAGNESIUM: Magnesium: 2.2 mg/dL (ref 1.7–2.4)

## 2022-12-24 MED ORDER — METOPROLOL TARTRATE 50 MG PO TABS: 50.0000 mg | ORAL_TABLET | Freq: Two times a day (BID) | ORAL | 0 refills | Status: DC

## 2022-12-24 MED ORDER — EMPAGLIFLOZIN 10 MG PO TABS: 10.0000 mg | ORAL_TABLET | Freq: Every day | ORAL | 0 refills | Status: DC

## 2022-12-24 MED ORDER — TORSEMIDE 40 MG PO TABS: 40.0000 mg | ORAL_TABLET | Freq: Every day | ORAL | 0 refills | Status: DC

## 2022-12-24 MED ORDER — LACTULOSE 10 GM/15ML PO SOLN: 20.0000 g | Freq: Once | ORAL | Status: DC

## 2022-12-24 MED ORDER — DILTIAZEM HCL ER COATED BEADS 360 MG PO CP24: 360.0000 mg | ORAL_CAPSULE | Freq: Every day | ORAL | 0 refills | Status: DC

## 2022-12-24 MED ORDER — MOMETASONE FURO-FORMOTEROL FUM 200-5 MCG/ACT IN AERO: 2.0000 | INHALATION_SPRAY | Freq: Two times a day (BID) | RESPIRATORY_TRACT | 0 refills | Status: DC

## 2022-12-24 NOTE — TOC Progression Note (Addendum)
Transition of Care [facility]) - Progression Note    Patient Details  Name: Kenneth Benson MRN: [id number] Date of Birth: May 17, 1961  Transition of Care [facility]) CM/SW Contact  Izola Price, RN Phone Number: 12/24/2022, 11:19 AM  Clinical Narrative: 1/27: Georgina Quint with Caryl Pina at [place] for NIV set up availability. They can meet patient at his home later today if discharged. Currently showing on oxygen 2L/[place] and if so, [employer] will also need ambulation saturation test for any DME oxygen for home and transport, which also can be done today per [place] at [phone] (Cell, can secure text). Updated provider. Simmie Davies RN CM      Patient is now on [employer] per Unit RN and Saturation Qualifying test in progress note. Simmie Davies RN CM   Note: Per Unit RN patient does not have anyone that can pick him up and transport to his home in [place], [place]. Will attempt to secure cab or other transportation. Simmie Davies RN CM   Note: [employer] confirmed ride to [place] home address and will pick up at 1 pm at [facility]. Ride Waiver signed and on hard chart by Unit RN. Voucher with approximate price and possible pharmacy stop printed to unit to give to driver. Driver given unit phone number in case of issues. Attempting to fax a copy to [place] but it is not currently going through. [employer] notified to time of pick up for NIV set up/education. Barbie Darinda [place] RN CM   [id number] [place] ([address]), [place] - [place] [[id number]]  [facility] [place] [[id number]]        Expected Discharge Plan and Services                                               Social Determinants of Health (SDOH) Interventions SDOH Screenings   Food Insecurity: No Food Insecurity (12/18/2022)  Housing: Low Risk  (12/18/2022)  Transportation Needs: Unmet Transportation Needs (12/18/2022)  Utilities: Not At Risk (12/18/2022)  Tobacco Use:  High Risk (12/18/2022)    Readmission Risk Interventions     No data to display

## 2022-12-24 NOTE — Progress Notes (Signed)
SATURATION QUALIFICATIONS: (This note is used to comply with regulatory documentation for home oxygen)  Patient Saturations on Room Air at Rest = 93%  Patient Saturations on Room Air while Ambulating = 92%  Patient Saturations on 0 Liters of oxygen while Ambulating = 92%  Please briefly explain why patient needs home oxygen: Not applicable

## 2022-12-24 NOTE — Discharge Summary (Signed)
Physician Discharge Summary   Patient: Kenneth Benson MRN: [id number] DOB: Apr 25, 1961  Admit date:     12/16/2022  Discharge date: 12/24/22  Discharge Physician: Sharen Hones   PCP: Pcp, No   Recommendations at discharge:   B in the clinic in 1 week. Follow-up with heart failure clinic in 1 week. Follow-up with cardiology in 1 week.  Discharge Diagnoses: Principal Problem:   Paroxysmal atrial fibrillation with RVR (HCC) Active Problems:   Acute hypoxic respiratory failure (HCC)   COPD exacerbation (HCC)   Acute on chronic combined systolic and diastolic CHF (congestive heart failure) (HCC)   AKI (acute kidney injury) (HCC)   Hyperkalemia   Morbid obesity (HCC)   Moderate tricuspid regurgitation  Resolved Problems:   Overweight (BMI 25.0-29.9)  Hospital Course: 62 year old male with a known history of COPD, asthma, ongoing tobacco abuse, A-fib is admitted for rapid A-fib/flutter and acute hypoxic respiratory failure requiring BiPAP Patient was seen cardiology and intensivist, was placed on Cardizem and metoprolol for rate control.  He also developed acute kidney injury and hyperkalemia.  Renal function so far has normalized. 1/24.  Patient appears to be significantly volume overloaded, renal function has normalized.  Restart IV Lasix 40 mg twice a day.  Assessment and Plan: Paroxysmal atrial fibrillation with RVR (HCC) Heart rate much better, continue beta-blocker and calcium channel blocker, continue Eliquis.    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 Moderate tricuspid regurgitation. Patient is diuresing well, volume status much improved.   Echocardiogram showed ejection fraction 45 to 50% with moderate tricuspid regurgitation. Patient was giving IV Lasix, volume status much improved.  Currently patient does not have any hypoxemia anymore, home oxygen evaluation did not show the need for home  oxygen. Patient also was on BiPAP while in the hospital.  Trilogy has been set up, I spoke with the social worker, it will be delivered to his house today.    COPD exacerbation ([place]) No longer has any bronchospasm, antibiotic discontinued.   Hyperkalemia AKI (acute kidney injury) ([place]) secondary to ATN. Acute kidney injury probably secondary to contrast and ATN.  Renal function is overall has normalized.  Potassium normalized.   Morbid obesity. Patient BMI is 36.74, patient has morbid obesity due to comorbidities with congestive heart failure.       Consultants: Neurology, nephrology. Procedures performed: None  Disposition: Home Diet recommendation:  Discharge Diet Orders (From admission, onward)     Start     Ordered   12/24/22 0000  Diet - low sodium heart healthy        12/24/22 1128           Cardiac diet DISCHARGE MEDICATION: Allergies as of 12/24/2022   No Known Allergies      Medication List     STOP taking these medications    predniSONE 5 MG tablet Commonly known as: DELTASONE       TAKE these medications    albuterol 108 (90 Base) MCG/ACT inhaler Commonly known as: Proventil HFA Inhale 2 puffs into the lungs every 6 (six) hours as needed for wheezing.   diltiazem 360 MG 24 hr capsule Commonly known as: CARDIZEM CD Take 1 capsule (360 mg total) by mouth daily. Start taking on: December 25, 2022   Eliquis 5 MG Tabs tablet Generic drug: apixaban Take 1 tablet (5 mg total) by mouth 2 (two) times daily.   empagliflozin 10 MG Tabs tablet Commonly known as: JARDIANCE Take  1 tablet (10 mg total) by mouth daily. Start taking on: December 25, 2022   metoprolol tartrate 50 MG tablet Commonly known as: LOPRESSOR Take 1 tablet (50 mg total) by mouth 2 (two) times daily. What changed:  medication strength how much to take   mometasone-formoterol 200-5 MCG/ACT Aero Commonly known as: DULERA Inhale 2 puffs into the lungs 2 (two) times daily.    Torsemide 40 MG Tabs Take 40 mg by mouth daily at 6 (six) AM.        Follow-up Information     Paraschos, Alexander, MD. Go in 1 week(s).   Specialty: Cardiology Contact information: [facility] [place] [address] [phone]         [place] Follow up in 1 week(s).   Specialty: Primary Care Contact information: [address] [place]        [place] Follow up in 1 week(s).   Specialty: Cardiology Contact information: [address] [place] [phone]               Discharge Exam: Danley Danker Weights   12/17/22 1746 12/18/22 1010  Weight: 99.8 kg 126.3 kg   General exam: Appears calm and comfortable  Respiratory system: Clear to auscultation. Respiratory effort normal. Cardiovascular system: Irregular. No JVD, murmurs, rubs, gallops or clicks. No pedal edema. Gastrointestinal system: Abdomen is nondistended, soft and nontender. No organomegaly or masses felt. Normal bowel sounds heard. Central nervous system: Alert and oriented x3. No focal neurological deficits. Extremities: Symmetric 5 x 5 power. Skin: No rashes, lesions or ulcers Psychiatry: Judgement and insight appear normal. Mood & affect appropriate.    Condition at discharge: good  The results of significant diagnostics from this hospitalization (including imaging, microbiology, ancillary and laboratory) are listed below for reference.   Imaging Studies: ECHOCARDIOGRAM COMPLETE  Result Date: 12/19/2022    ECHOCARDIOGRAM REPORT   Patient Name:   Kenneth Benson [facility] Date of Exam: 12/18/2022 Medical Rec #:  [id number]     Height:       73.0 in Accession #:    [id number]    Weight:       220.0 lb Date of Birth:  December 11, 1960    BSA:          2.241 m Patient Age:    16 years      BP:           118/73 mmHg Patient  Gender: M             HR:           115 bpm. Exam Location:  [facility] Procedure: 2D Echo, Color Doppler and Cardiac Doppler Indications:     Dyspnea  History:         Patient has prior history of Echocardiogram examinations. COPD;                  Risk Factors:Current Smoker.  Sonographer:     L. Thornton-Maynard Referring Phys:  [id number] VIPUL [facility] Diagnosing Phys: Isaias Cowman MD IMPRESSIONS  1. Left ventricular ejection fraction, by estimation, is 45 to 50%. The left ventricle has mildly decreased function. The left ventricle has no regional wall motion abnormalities. The left ventricular internal cavity size was mildly dilated. Left ventricular diastolic parameters were normal.  2. Right ventricular systolic function is normal. The right ventricular size is normal. There  is mildly elevated pulmonary artery systolic pressure.  3. The mitral valve is normal in structure. Moderate mitral valve regurgitation. No evidence of mitral stenosis.  4. Tricuspid valve regurgitation is moderate.  5. The aortic valve is normal in structure. Aortic valve regurgitation is not visualized. No aortic stenosis is present.  6. The inferior vena cava is normal in size with greater than 50% respiratory variability, suggesting right atrial pressure of 3 mmHg. FINDINGS  Left Ventricle: Left ventricular ejection fraction, by estimation, is 45 to 50%. The left ventricle has mildly decreased function. The left ventricle has no regional wall motion abnormalities. The left ventricular internal cavity size was mildly dilated. There is borderline left ventricular hypertrophy. Left ventricular diastolic parameters were normal.  LV Wall Scoring: The mid inferoseptal segment and apical septal segment are hypokinetic. Right Ventricle: The right ventricular size is normal. No increase in right ventricular wall thickness. Right ventricular systolic function is normal. There is mildly elevated pulmonary artery systolic pressure. The tricuspid  regurgitant velocity is 2.73  m/s, and with an assumed right atrial pressure of 15 mmHg, the estimated right ventricular systolic pressure is 44.8 mmHg. Left Atrium: Left atrial size was normal in size. Right Atrium: Right atrial size was normal in size. Pericardium: There is no evidence of pericardial effusion. Mitral Valve: The mitral valve is normal in structure. Moderate mitral valve regurgitation. No evidence of mitral valve stenosis. MV peak gradient, 5.4 mmHg. The mean mitral valve gradient is 3.7 mmHg. Tricuspid Valve: The tricuspid valve is normal in structure. Tricuspid valve regurgitation is moderate . No evidence of tricuspid stenosis. Aortic Valve: The aortic valve is normal in structure. Aortic valve regurgitation is not visualized. No aortic stenosis is present. Aortic valve mean gradient measures 4.0 mmHg. Aortic valve peak gradient measures 7.2 mmHg. Aortic valve area, by VTI measures 2.66 cm. Pulmonic Valve: The pulmonic valve was normal in structure. Pulmonic valve regurgitation is not visualized. No evidence of pulmonic stenosis. Aorta: The aortic root is normal in size and structure. Venous: The inferior vena cava is normal in size with greater than 50% respiratory variability, suggesting right atrial pressure of 3 mmHg. IAS/Shunts: No atrial level shunt detected by color flow Doppler.  LEFT VENTRICLE PLAX 2D LVIDd:         6.10 cm      Diastology LVIDs:         5.30 cm      LV e' medial:    7.72 cm/s LV PW:         1.00 cm      LV E/e' medial:  14.1 LV IVS:        0.90 cm      LV e' lateral:   12.28 cm/s LVOT diam:     2.20 cm      LV E/e' lateral: 8.9 LV SV:         57 LV SV Index:   25 LVOT Area:     3.80 cm  LV Volumes (MOD) LV vol d, MOD A2C: 192.0 ml LV vol d, MOD A4C: 154.0 ml LV vol s, MOD A2C: 145.0 ml LV vol s, MOD A4C: 116.0 ml LV SV MOD A2C:     47.0 ml LV SV MOD A4C:     154.0 ml LV SV MOD BP:      44.1 ml RIGHT VENTRICLE            IVC RV Basal diam:  4.40 cm    IVC diam:  3.20 cm  RV S prime:     7.94 cm/s TAPSE (M-mode): 1.3 cm LEFT ATRIUM              Index        RIGHT ATRIUM           Index LA diam:        5.00 cm  2.23 cm/m   RA Area:     28.90 cm LA Vol (A2C):   81.0 ml  36.15 ml/m  RA Volume:   96.30 ml  42.98 ml/m LA Vol (A4C):   122.0 ml 54.45 ml/m LA Biplane Vol: 101.0 ml 45.08 ml/m  AORTIC VALVE                    PULMONIC VALVE AV Area (Vmax):    2.63 cm     PV Vmax:       0.91 m/s AV Area (Vmean):   2.61 cm     PV Peak grad:  3.3 mmHg AV Area (VTI):     2.66 cm AV Vmax:           134.00 cm/s AV Vmean:          93.950 cm/s AV VTI:            0.213 m AV Peak Grad:      7.2 mmHg AV Mean Grad:      4.0 mmHg LVOT Vmax:         92.80 cm/s LVOT Vmean:        64.450 cm/s LVOT VTI:          0.149 m LVOT/AV VTI ratio: 0.70  AORTA Ao Root diam: 3.50 cm Ao Asc diam:  3.30 cm MITRAL VALVE                  TRICUSPID VALVE MV Area (PHT): 3.78 cm       TR Peak grad:   29.8 mmHg MV Area VTI:   3.64 cm       TR Vmax:        273.00 cm/s MV Peak grad:  5.4 mmHg MV Mean grad:  3.7 mmHg       SHUNTS MV Vmax:       1.16 m/s       Systemic VTI:  0.15 m MV Vmean:      86.9 cm/s      Systemic Diam: 2.20 cm MV Decel Time: 201 msec MR Peak grad:    51.4 mmHg MR Mean grad:    34.7 mmHg MR Vmax:         358.33 cm/s MR Vmean:        277.7 cm/s MR PISA:         0.57 cm MR PISA Eff ROA: 6 mm MR PISA Radius:  0.30 cm MV E velocity: 109.00 cm/s Isaias Cowman MD Electronically signed by Isaias Cowman MD Signature Date/Time: 12/19/2022/1:52:47 PM    Final    DG Chest Port 1 View  Result Date: 12/17/2022 CLINICAL DATA:  Respiratory failure EXAM: PORTABLE CHEST 1 VIEW COMPARISON:  Yesterday FINDINGS: Cardiomegaly and vascular pedicle widening. Central vascular congestion and diffuse interstitial opacity. Trace pleural fluid is possible. No pneumothorax. IMPRESSION: CHF. Electronically Signed   By: Jorje Guild M.D.   On: 12/17/2022 08:39   CT Angio Abd/Pel W and/or Wo  Contrast  Result Date: 12/16/2022 CLINICAL DATA:  Mesenteric ischemia. EXAM: CTA ABDOMEN AND PELVIS WITHOUT AND WITH CONTRAST TECHNIQUE:  Multidetector CT imaging of the abdomen and pelvis was performed using the standard protocol during bolus administration of intravenous contrast. Multiplanar reconstructed images and MIPs were obtained and reviewed to evaluate the vascular anatomy. RADIATION DOSE REDUCTION: This exam was performed according to the departmental dose-optimization program which includes automated exposure control, adjustment of the mA and/or kV according to patient size and/or use of iterative reconstruction technique. CONTRAST:  168mL OMNIPAQUE IOHEXOL 350 MG/ML SOLN COMPARISON:  Abdominal x-ray 02/18/2022 FINDINGS: VASCULAR Aorta: Overall normal course and caliber of the abdominal aorta with minimal atherosclerotic disease. No dissection or aneurysm formation. Celiac: Standard branching pattern to the celiac axis. No ostial stenosis. Tortuous course of the splenic artery. SMA: Patent without evidence of aneurysm, dissection, vasculitis or significant stenosis. Renals: 2 left and single right main renal arteries. No significant stenosis. IMA: Patent. Inflow: No significant stenosis.  Minimal atherosclerotic disease. Proximal Outflow: Grossly preserved.  No significant stenosis. Veins: Somewhat distended IVC and central iliac vessels. Timing of the contrast is arterial. Review of the MIP images confirms the above findings. NON-VASCULAR Lower chest: Heart is enlarged. Small pericardial effusion. Tiny pleural effusions. There patchy lung base opacities with breathing motion. Possible enlarged node seen in the lower posterior mediastinum measured on series 6, image 1 measuring 2.2 x 1.6 cm. On prior chest CT of 01/30/2022 this node would have measured 17 x 12 mm. Hepatobiliary: Fatty liver infiltration. No obvious space-occupying liver lesion with the limits of the early phase of the arterial bolus.  The gallbladder is contracted with stones. Patent portal vein. Pancreas: Mild global pancreatic atrophy without obvious mass. Spleen: Spleen is nonenlarged. Adrenals/Urinary Tract: Adrenal glands are preserved. No enhancing renal mass. Right kidney is slightly malrotated. There is a Bosniak 1 cyst extending inferior from the mid right kidney measuring diameter approaching 4.4 cm with Hounsfield unit of 8. No aggressive features. Bosniak 1 lesion. No specific imaging follow-up recommended. No collecting system dilatation. The ureters have normal course and caliber down to the bladder. Preserved contours of the urinary bladder. Stomach/Bowel: On this non oral contrast exam large bowel has a normal course and caliber with scattered stool. The cecum resides in the central pelvis. Normal retrocecal appendix. The stomach has some luminal debris but is nondilated. Small bowel is nondilated. No free air. Lymphatic: Few small less than 1 cm in size in short axis nodes are seen along the retroperitoneum and mesentery, not pathologic by size criteria. Reproductive: Prominent prostate. Other: Large bilateral inguinal hernias. The right contains fat and fluid with stranding. The left similar but also contains the sigmoid colon. Due to the size of the hernia the caudal aspect of the bowel and hernias not included in the imaging field. What are seen of the colon is nondilated. Musculoskeletal: Patient is tilted in the scanner. Scattered degenerative changes of the spine. There is some mild compression of the superior endplate of L3 but the margins are sclerotic and favor a chronic process. IMPRESSION: VASCULAR Minimal atherosclerotic disease. No significant stenosis or vessel occlusion of the central vessels of the abdomen or the aorta or iliac vessels. NON-VASCULAR Large bilateral inguinal hernias. The right containing fat, fluid and stranding. The left containing the sigmoid colon. This is a wide mouth hernia with no signs of  obstruction Note the entirety of the sigmoid colon is not completely included in the imaging field due to the size of the hernia and extension caudal. Mild ascites with anasarca. Trace pleural fluid. The heart is enlarged. Slightly enlarged lower  posterior right-sided mediastinal lymph node at the edge of the imaging field compared to an older study. Recommend dedicated chest workup when appropriate. Lung base parenchymal opacities. Electronically Signed   By: Jill Side M.D.   On: 12/16/2022 17:11   DG Chest 1 View  Result Date: 12/16/2022 CLINICAL DATA:  Dyspnea EXAM: CHEST  1 VIEW COMPARISON:  Chest radiograph 05/23/2022 FINDINGS: The heart is enlarged. The upper mediastinal contours are within normal limits There is prominence of the pulmonary vasculature with diffuse increased interstitial markings likely reflecting pulmonary interstitial edema. There are suspected small bilateral pleural effusions. There is no pneumothorax There is no acute osseous abnormality. IMPRESSION: Cardiomegaly with pulmonary interstitial edema and likely trace effusions. Findings may reflect CHF. Electronically Signed   By: Valetta Mole M.D.   On: 12/16/2022 12:56    Microbiology: Results for orders placed or performed during the hospital encounter of 12/16/22  Resp panel by RT-PCR (RSV, Flu A&B, Covid) Anterior Nasal Swab     Status: None   Collection Time: 12/16/22  1:18 PM   Specimen: Anterior Nasal Swab  Result Value Ref Range Status   SARS Coronavirus 2 by RT PCR NEGATIVE NEGATIVE Final    Comment: (NOTE) SARS-CoV-2 target nucleic acids are NOT DETECTED.  The SARS-CoV-2 RNA is generally detectable in upper respiratory specimens during the acute phase of infection. The lowest concentration of SARS-CoV-2 viral copies this assay can detect is 138 copies/mL. A negative result does not preclude SARS-Cov-2 infection and should not be used as the sole basis for treatment or other patient management decisions. A  negative result may occur with  improper specimen collection/handling, submission of specimen other than nasopharyngeal swab, presence of viral mutation(s) within the areas targeted by this assay, and inadequate number of viral copies(<138 copies/mL). A negative result must be combined with clinical observations, patient history, and epidemiological information. The expected result is Negative.  Fact Sheet for Patients:  EntrepreneurPulse.com.au  Fact Sheet for Healthcare Providers:  IncredibleEmployment.be  This test is no t yet approved or cleared by the [place] FDA and  has been authorized for detection and/or diagnosis of SARS-CoV-2 by FDA under an Emergency Use Authorization (EUA). This EUA will remain  in effect (meaning this test can be used) for the duration of the COVID-19 declaration under Section 564(b)(1) of the Act, 21 U.S.C.section 360bbb-3(b)(1), unless the authorization is terminated  or revoked sooner.       Influenza A by PCR NEGATIVE NEGATIVE Final   Influenza B by PCR NEGATIVE NEGATIVE Final    Comment: (NOTE) The Xpert Xpress SARS-CoV-2/FLU/RSV plus assay is intended as an aid in the diagnosis of influenza from Nasopharyngeal swab specimens and should not be used as a sole basis for treatment. Nasal washings and aspirates are unacceptable for Xpert Xpress SARS-CoV-2/FLU/RSV testing.  Fact Sheet for Patients: EntrepreneurPulse.com.au  Fact Sheet for Healthcare Providers: IncredibleEmployment.be  This test is not yet approved or cleared by the [place] FDA and has been authorized for detection and/or diagnosis of SARS-CoV-2 by FDA under an Emergency Use Authorization (EUA). This EUA will remain in effect (meaning this test can be used) for the duration of the COVID-19 declaration under Section 564(b)(1) of the Act, 21 U.S.C. section 360bbb-3(b)(1), unless the authorization  is terminated or revoked.     Resp Syncytial Virus by PCR NEGATIVE NEGATIVE Final    Comment: (NOTE) Fact Sheet for Patients: EntrepreneurPulse.com.au  Fact Sheet for Healthcare Providers: IncredibleEmployment.be  This test is not yet  approved or cleared by the [place] and has been authorized for detection and/or diagnosis of SARS-CoV-2 by FDA under an Emergency Use Authorization (EUA). This EUA will remain in effect (meaning this test can be used) for the duration of the COVID-19 declaration under Section 564(b)(1) of the Act, 21 U.S.C. section 360bbb-3(b)(1), unless the authorization is terminated or revoked.  Performed at [facility], [address] [place], [place] [address]   MRSA Next Gen by PCR, Nasal     Status: None   Collection Time: 12/18/22 10:15 AM   Specimen: Nasal Mucosa; Nasal Swab  Result Value Ref Range Status   MRSA by PCR Next Gen NOT DETECTED NOT DETECTED Final    Comment: (NOTE) The GeneXpert MRSA Assay (FDA approved for NASAL specimens only), is one component of a comprehensive MRSA colonization surveillance program. It is not intended to diagnose MRSA infection nor to guide or monitor treatment for MRSA infections. Test performance is not FDA approved in patients less than 31 years old. Performed at [facility], [address] [place], [place] [address]     Labs: CBC: Recent Labs  Lab 12/18/22 [phone] 12/19/22 0431 12/20/22 0343 12/21/22 0322 12/22/22 0504  WBC 10.9* 9.4 9.2 6.2 7.3  HGB 13.8 14.2 13.3 13.5 14.0  HCT 45.6 46.6 42.5 44.2 45.1  MCV 97.2 97.1 94.4 96.5 93.8  PLT 189 201 206 199 [id number]   Basic Metabolic Panel: Recent Labs  Lab 12/20/22 0343 12/20/22 0808 12/21/22 0322 12/21/22 1554 12/22/22 0504 12/23/22 0441 12/24/22 0449  NA 134*  --  139  --  140 138 139  K 4.8   < > 4.5 4.4 3.6 3.6 3.8  CL 98  --  100  --  90* 92* 95*  CO2 30  --  36*  --  42* 42*  36*  GLUCOSE 127*  --  107*  --  86 153* 140*  BUN 58*  --  41*  --  36* 33* 27*  CREATININE 1.12  --  0.84  --  1.10 1.08 1.02  CALCIUM 8.3*  --  8.5*  --  8.6* 8.3* 8.3*  MG  --   --   --  2.2 2.3 2.3 2.2   < > = values in this interval not displayed.   Liver Function Tests: No results for input(s): "AST", "ALT", "ALKPHOS", "BILITOT", "PROT", "ALBUMIN" in the last 168 hours. CBG: Recent Labs  Lab 12/18/22 1008  GLUCAP 155*    Discharge time spent: greater than 30 minutes.  Signed: Sharen Hones, MD [facility] 12/24/2022

## 2022-12-24 NOTE — Progress Notes (Signed)
[facility] Cardiology    SUBJECTIVE: Patient states he feels much better no shortness of breath denies palpitations or tachycardia ready for discharge home   Vitals:   12/23/22 2359 12/24/22 0333 12/24/22 0751 12/24/22 0830  BP: (!) 149/135 117/77 101/81   Pulse: 85 80 97 (!) 103  Resp: 18 18 19 18   Temp: 98.1 F (36.7 C) 97.9 F (36.6 C) 98.1 F (36.7 C)   TempSrc:      SpO2: 93% 95% 93% 94%  Weight:      Height:         Intake/Output Summary (Last 24 hours) at 12/24/2022 1125 Last data filed at 12/24/2022 3244 Gross per 24 hour  Intake --  Output 5350 ml  Net -5350 ml      PHYSICAL EXAM  General: Well developed, well nourished, in no acute distress HEENT:  Normocephalic and atramatic Neck:  No JVD.  Lungs: Clear bilaterally to auscultation and percussion. Heart: Irregularly irregular. Normal S1 and S2 without gallops or murmurs.  Abdomen: Bowel sounds are positive, abdomen soft and non-tender  Msk:  Back normal, normal gait. Normal strength and tone for age. Extremities: No clubbing, cyanosis or edema.   Neuro: Alert and oriented X 3. Psych:  Good affect, responds appropriately   LABS: Basic Metabolic Panel: Recent Labs    12/23/22 0441 12/24/22 0449  NA 138 139  K 3.6 3.8  CL 92* 95*  CO2 42* 36*  GLUCOSE 153* 140*  BUN 33* 27*  CREATININE 1.08 1.02  CALCIUM 8.3* 8.3*  MG 2.3 2.2   Liver Function Tests: No results for input(s): "AST", "ALT", "ALKPHOS", "BILITOT", "PROT", "ALBUMIN" in the last 72 hours. No results for input(s): "LIPASE", "AMYLASE" in the last 72 hours. CBC: Recent Labs    12/22/22 0504  WBC 7.3  HGB 14.0  HCT 45.1  MCV 93.8  PLT 223   Cardiac Enzymes: No results for input(s): "CKTOTAL", "CKMB", "CKMBINDEX", "TROPONINI" in the last 72 hours. BNP: Invalid input(s): "POCBNP" Benson-Dimer: No results for input(s): "DDIMER" in the last 72 hours. Hemoglobin A1C: No results for input(s): "HGBA1C" in the last 72 hours. Fasting Lipid  Panel: No results for input(s): "CHOL", "HDL", "LDLCALC", "TRIG", "CHOLHDL", "LDLDIRECT" in the last 72 hours. Thyroid Function Tests: No results for input(s): "TSH", "T4TOTAL", "T3FREE", "THYROIDAB" in the last 72 hours.  Invalid input(s): "FREET3" Anemia Panel: No results for input(s): "VITAMINB12", "FOLATE", "FERRITIN", "TIBC", "IRON", "RETICCTPCT" in the last 72 hours.  No results found.   Echo mild reduced left ventricular function EF around 45 to 50%  TELEMETRY: Atrial fibrillation rate controlled at 90 nonspecific T2 changes:  ASSESSMENT AND PLAN:  Principal Problem:   Paroxysmal atrial fibrillation with RVR (HCC) Active Problems:   COPD exacerbation (HCC)   Acute hypoxic respiratory failure (HCC)   Acute on chronic combined systolic and diastolic CHF (congestive heart failure) (HCC)   AKI (acute kidney injury) (HCC)   Hyperkalemia   Morbid obesity (HCC)   Moderate tricuspid regurgitation    Plan Paroxysmal atrial fibrillation rate reasonably controlled continue current medical therapy including Eliquis for anticoagulation calcium and beta-blocker for rate Episode of hypoxic respiratory failure resolved continue current therapy advised patient refrain from tobacco abuse inhalers as necessary follow-up with pulmonary continue heart failure therapy Acute on chronic heart failure therapy systolic and diastolic function improved continue current management with torsemide metoprolol Diabetes type 2 continue Jardiance for diabetes management and control Mild obesity recommend weight loss exercise portion control   Kenneth Benson  Brennden Masten, MD 12/24/2022 11:25 AM

## 2022-12-28 NOTE — Progress Notes (Unsigned)
   Patient ID: Kenneth Benson, male    DOB: [birth year], 62 y.o.   MRN: [id number]  HPI  Kenneth Benson is a 62 y/o male with a history of  Echo 12/18/22 showed an EF of 45-50% along with mildly elevated PA pressure and moderate Kenneth/ TR  Admitted 12/16/22 due to AF/ HF exacerbation. Diuresed. Initially on bipap and trilogy to be delivered to patient's home.   He presents today for his initial visit with a chief complaint of   Review of Systems    Physical Exam    Assessment & Plan:  1: Chronic heart failure with reduced ejection fraction-  - NYHA class  2: Atrial fibrillation- - saw cardiology Kenneth Benson) 03/15/22  3: COPD- - needs [facility] if uninsured

## 2022-12-29 ENCOUNTER — Ambulatory Visit: Payer: Medicaid Other | Attending: Family | Admitting: Family

## 2022-12-29 ENCOUNTER — Encounter: Payer: Self-pay | Admitting: Family

## 2022-12-29 ENCOUNTER — Telehealth: Payer: Self-pay | Admitting: Family

## 2022-12-29 VITALS — BP 104/81 | HR 45 | Resp 18 | Wt 246.0 lb

## 2022-12-29 DIAGNOSIS — Z79899 Other long term (current) drug therapy: Secondary | ICD-10-CM | POA: Diagnosis not present

## 2022-12-29 DIAGNOSIS — R0789 Other chest pain: Secondary | ICD-10-CM | POA: Diagnosis not present

## 2022-12-29 DIAGNOSIS — Z7984 Long term (current) use of oral hypoglycemic drugs: Secondary | ICD-10-CM | POA: Insufficient documentation

## 2022-12-29 DIAGNOSIS — I509 Heart failure, unspecified: Secondary | ICD-10-CM | POA: Insufficient documentation

## 2022-12-29 DIAGNOSIS — Z716 Tobacco abuse counseling: Secondary | ICD-10-CM | POA: Diagnosis not present

## 2022-12-29 DIAGNOSIS — J449 Chronic obstructive pulmonary disease, unspecified: Secondary | ICD-10-CM | POA: Insufficient documentation

## 2022-12-29 DIAGNOSIS — R0602 Shortness of breath: Secondary | ICD-10-CM | POA: Diagnosis present

## 2022-12-29 DIAGNOSIS — Z720 Tobacco use: Secondary | ICD-10-CM | POA: Diagnosis not present

## 2022-12-29 DIAGNOSIS — Z7901 Long term (current) use of anticoagulants: Secondary | ICD-10-CM | POA: Diagnosis not present

## 2022-12-29 DIAGNOSIS — I480 Paroxysmal atrial fibrillation: Secondary | ICD-10-CM

## 2022-12-29 DIAGNOSIS — F17200 Nicotine dependence, unspecified, uncomplicated: Secondary | ICD-10-CM | POA: Diagnosis not present

## 2022-12-29 DIAGNOSIS — I4891 Unspecified atrial fibrillation: Secondary | ICD-10-CM | POA: Diagnosis not present

## 2022-12-29 DIAGNOSIS — R42 Dizziness and giddiness: Secondary | ICD-10-CM | POA: Diagnosis not present

## 2022-12-29 DIAGNOSIS — I5022 Chronic systolic (congestive) heart failure: Secondary | ICD-10-CM | POA: Diagnosis not present

## 2022-12-29 MED ORDER — EMPAGLIFLOZIN 10 MG PO TABS: 10.0000 mg | ORAL_TABLET | Freq: Every day | ORAL | 5 refills | Status: DC

## 2022-12-29 MED ORDER — APIXABAN 5 MG PO TABS: 5.0000 mg | ORAL_TABLET | Freq: Two times a day (BID) | ORAL | 5 refills | Status: DC

## 2022-12-29 MED ORDER — DILTIAZEM HCL ER COATED BEADS 360 MG PO CP24: 360.0000 mg | ORAL_CAPSULE | Freq: Every day | ORAL | 5 refills | Status: DC

## 2022-12-29 MED ORDER — TORSEMIDE 20 MG PO TABS: 40.0000 mg | ORAL_TABLET | Freq: Every day | ORAL | 5 refills | Status: DC

## 2022-12-29 MED ORDER — METOPROLOL TARTRATE 50 MG PO TABS: 50.0000 mg | ORAL_TABLET | Freq: Two times a day (BID) | ORAL | 5 refills | Status: DC

## 2022-12-29 NOTE — Patient Instructions (Addendum)
Begin weighing daily and call for an overnight weight gain of 3 pounds or more or a weekly weight gain of more than 5 pounds.   If you have voicemail, please make sure your mailbox is cleaned out so that we may leave a message and please make sure to listen to any voicemails.    I sent 5 prescriptions to [facility] for you to pick up. The 4 you currently and your eliquis (blood thinner)

## 2022-12-29 NOTE — Telephone Encounter (Signed)
LVM with patient notifying him that we scheduled him with a PCP and Pulmonary was already scheduled as he requested he needed appointments for both and notified him of the dates and times of appointments.   Tyree Vandruff, NT

## 2023-01-06 ENCOUNTER — Ambulatory Visit (INDEPENDENT_AMBULATORY_CARE_PROVIDER_SITE_OTHER): Payer: Medicaid Other | Admitting: Internal Medicine

## 2023-01-06 ENCOUNTER — Encounter: Payer: Self-pay | Admitting: Internal Medicine

## 2023-01-06 VITALS — BP 110/68 | HR 68 | Temp 97.8°F | Ht 73.0 in | Wt 248.2 lb

## 2023-01-06 DIAGNOSIS — J441 Chronic obstructive pulmonary disease with (acute) exacerbation: Secondary | ICD-10-CM

## 2023-01-06 DIAGNOSIS — R0602 Shortness of breath: Secondary | ICD-10-CM

## 2023-01-06 DIAGNOSIS — F17210 Nicotine dependence, cigarettes, uncomplicated: Secondary | ICD-10-CM | POA: Diagnosis not present

## 2023-01-06 NOTE — Progress Notes (Unsigned)
Kenneth Benson, male    DOB: 12-Jun-1961   MRN: [id number]   Brief patient profile:  6  yowm   active smoker/carpenter   referred to pulmonary clinic in [facility]  01/06/2023 by  [place] hospitalistsfor post hosp f/u.  Admit date:     12/16/2022  Discharge date: 12/24/22      Discharge Diagnoses: Principal Problem:   Paroxysmal atrial fibrillation with RVR (HCC)   Acute hypoxic respiratory failure (HCC)   COPD exacerbation (HCC)   Acute on chronic combined systolic and diastolic CHF (congestive heart failure) (HCC)   AKI (acute kidney injury) ([place])   Hyperkalemia   Morbid obesity (HCC)   Moderate tricuspid regurgitation   Hospital Course: 62 year old male with a known history of COPD, asthma, ongoing tobacco abuse, A-fib is admitted for rapid A-fib/flutter and acute hypoxic respiratory failure requiring BiPAP Patient was seen cardiology and intensivist, was placed on Cardizem and metoprolol for rate control.  He also developed acute kidney injury and hyperkalemia.  Renal function so far has normalized. 1/24.  Patient appears to be significantly volume overloaded, renal function has normalized.  Restart IV Lasix 40 mg twice a day.   Assessment and Plan: Paroxysmal atrial fibrillation with RVR (HCC) Heart rate much better, continue beta-blocker and calcium channel blocker, continue Eliquis.   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 Moderate tricuspid regurgitation. Patient is diuresing well, volume status much improved.   Echocardiogram showed ejection fraction 45 to 50% with moderate tricuspid regurgitation. Patient was giving IV Lasix, volume status much improved.  Currently patient does not have any hypoxemia anymore, home oxygen evaluation did not show the need for home oxygen. Patient also was on BiPAP while in the hospital.  Trilogy has been set up, I spoke with the social worker, it will be  delivered to his house today.   COPD exacerbation ([place]) No longer has any bronchospasm, antibiotic discontinued.   Hyperkalemia AKI (acute kidney injury) ([place]) secondary to ATN. Acute kidney injury probably secondary to contrast and ATN.  Renal function is overall has normalized.  Potassium normalized.   Morbid obesity. Patient BMI is 36.74, patient has morbid obesity due to comorbidities with congestive heart failure.       History of Present Illness  01/06/2023  Pulmonary/ 1st office eval/ Kenneth Benson / [facility]  Office maint on [employer] Complaint  Patient presents with   Consult    COPD Exacerbation. ED on 12/16/2022.  DOE. Wheezing comes and goes. Cough with clear sputum. Asthma as a child.   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 60 tablet 5   diltiazem (CARDIZEM CD) 360 MG 24 hr capsule Take 1 capsule (360 mg total) by mouth daily. 30 capsule 5   empagliflozin (JARDIANCE) 10 MG TABS tablet Take 1 tablet (10 mg  total) by mouth daily. 30 tablet 5   metoprolol tartrate (LOPRESSOR) 50 MG tablet Take 1 tablet (50 mg total) by mouth 2 (two) times daily. 60 tablet 5   mometasone-formoterol (DULERA) 200-5 MCG/ACT AERO Inhale 2 puffs into the lungs 2 (two) times daily. 13 g 0   torsemide (DEMADEX) 20 MG tablet Take 2 tablets (40 mg total) by mouth daily. 60 tablet 5       3      Objective:     BP 110/68 (BP Location: Left Arm, Cuff Size:  Large)   Pulse 68   Temp 97.8 F (36.6 C)   Ht 6' 1"$  (1.854 m)   Wt 248 lb 3.2 oz (112.6 kg)   SpO2 94%   BMI 32.75 kg/m   SpO2: 94 % nad   Amb wm nad     HEENT : Oropharynx  clear / edentulous   Nasal turbinates nl    NECK :  without  apparent JVD/ palpable Nodes/TM    LUNGS: no acc muscle use,  Min barrel  contour chest wall with bilateral  slightly decreased bs s audible wheeze and  without cough on insp or exp maneuvers and min  Hyperresonant  to  percussion bilaterally    CV:  IRIR  no s3 or murmur or increase in P2, and no edema   ABD:  soft and nontender with pos end  insp Hoover's  in the supine position.  No bruits or organomegaly appreciated   MS:  Nl gait/ ext warm without deformities Or obvious joint restrictions  calf tenderness, cyanosis or clubbing     SKIN: warm and dry without lesions    NEURO:  alert, approp, nl sensorium with  no motor or cerebellar deficits apparent.            I personally reviewed images and agree with radiology impression as follows:  CXR:   portable 12/17/22 CHF.  Assessment   No problem-specific Assessment & Plan notes found for this encounter.     Christinia Gully, MD 01/06/2023

## 2023-01-06 NOTE — Patient Instructions (Addendum)
My office will be contacting you by phone for referral to pfts  - ok to use your dulera before your PFTs - if you don't hear back from my office within one week please call us back or notify us thru MyChart and we'll address it right away.   We will set you  up here with a sleep specialist for your next visit - in the meantime continue your machine  Work on inhaler technique:  relax and gently blow all the way out then take a nice smooth full deep breath back in, triggering the inhaler at same time you start breathing in.  Hold breath in for at least  5 seconds if you can. Blow out [employer] out  thru nose. Rinse and gargle with water when done.  If mouth or throat bother you at all,  try brushing teeth/gums/tongue with arm and hammer toothpaste/ make a slurry and gargle and spit out.

## 2023-01-07 ENCOUNTER — Encounter: Payer: Self-pay | Admitting: Internal Medicine

## 2023-01-07 NOTE — Assessment & Plan Note (Addendum)
Active smoker - see admit 12/16/22 d/c on ? Bipap or Trilogy?  - 01/06/2023  After extensive coaching inhaler device,  effectiveness =    75% from a baseline of < 25% so rec continue dulera 200 2bid and f/u with Dr Jenell Milliner (seen during admit)  - 01/06/2023   Walked on RA  x  3  lap(s) =  approx 525  ft  @ mod pace, stopped due to end of study with lowest 02 sats 92%    DDX of  difficult airways management almost all start with A and  include Adherence, Ace Inhibitors, Acid Reflux, Active Sinus Disease, Alpha 1 Antitripsin deficiency, Anxiety masquerading as Airways dz,  ABPA,  Allergy(esp in young), Aspiration (esp in elderly), Adverse effects of meds,  Active smoking or vaping, A bunch of PE's (a small clot burden can't cause this syndrome unless there is already severe underlying pulm or vascular dz with poor reserve) plus two Bs  = Bronchiectasis and Beta blocker use..and one C= CHF - Adherence is always the initial "prime suspect" and is a multilayered concern that requires a "trust but verify" approach in every patient - starting with knowing how to use medications, especially inhalers, correctly, keeping up with refills and understanding the fundamental difference between maintenance and prns vs those medications only taken for a very short course and then stopped and not refilled.  - see hfa teaching   Active smoking  > see sep a/p   ? Allergy / poor control of AB due to suboptimal hfa > continue dulera 200 2bid pending pfts ? Needs lama added   ? Alpha one >  does not appear to have much emphysema on prior CT but needs phenotype if w/u indicates it is evolving   ? A bunch of PE's > on eliquis for Afib  ? CHF/ cardiac asthma >  BNP nearly 2 k  and  cxr with chf on admit so this is the most likely trigger for admit   >>> PFTs and  f/u with Dr Jenell Milliner who saw pt during recent admit          Each maintenance medication was reviewed in detail including emphasizing most importantly the difference  between maintenance and prns and under what circumstances the prns are to be triggered using an action plan format where appropriate.  Total time for H and P, chart review, counseling, reviewing hfa device(s) , directly observing portions of ambulatory 02 saturation study/ and generating customized AVS unique to this office visit / same day charting =  40 min with post hosp pt new to me

## 2023-01-07 NOTE — Assessment & Plan Note (Addendum)
4-5 min discussion re active cigarette smoking in addition to office E&M  Ask about tobacco use: ongoing Advise quitting    I took an extended  opportunity with this patient to outline the consequences of continued cigarette use  in airway disorders based on all the data we have from the multiple national lung health studies (perfomed over decades at millions of dollars in cost)  indicating that smoking cessation, not choice of inhalers or physicians, is the most important aspect of his care.   Assess willingness:  Not committed at this point Assist in quit attempt:  Per PCP when ready - in meantime can try nicotine patches otc  Arrange follow up:   Follow up per Primary Care advised

## 2023-01-12 ENCOUNTER — Encounter: Payer: Medicaid Other | Admitting: Family

## 2023-01-12 ENCOUNTER — Telehealth: Payer: Self-pay | Admitting: Family

## 2023-01-12 NOTE — Progress Notes (Deleted)
Patient ID: Kenneth Benson, male    DOB: Jul 03, 1961, 62 y.o.   MRN: [id number]  HPI  Mr Nardini is a 62 y/o male with a history of atrial fibrillation, asthma, COPD, current tobacco use and chronic heart failure.   Echo 12/18/22 showed an EF of 45-50% along with mildly elevated PA pressure and moderate MR/ TR  Admitted 12/16/22 due to AF/ HF exacerbation. Diuresed. Initially on bipap and trilogy to be delivered to patient's home.   He presents today for a follow-up visit with a chief complaint of   Now wearing trilogy and says that he sleeps "so much better".   Past Medical History:  Diagnosis Date   Arrhythmia    atrial fibrillation   Asthma    CHF (congestive heart failure) (HCC)    COPD (chronic obstructive pulmonary disease) (HCC)    Tobacco abuse    No past surgical history on file. No family history on file. Social History   Tobacco Use   Smoking status: Every Day    Packs/day: 1.50    Years: 27.00    Total pack years: 40.50    Types: Cigarettes   Smokeless tobacco: Not on file   Tobacco comments:    0.5 PPD 01/06/2023 khj  Substance Use Topics   Alcohol use: No   No Known Allergies  Review of Systems  Constitutional:  Positive for fatigue. Negative for appetite change.  HENT:  Negative for congestion, postnasal drip and sore throat.   Eyes: Negative.   Respiratory:  Positive for cough, shortness of breath and wheezing. Negative for chest tightness.   Cardiovascular:  Positive for chest pain (at times) and leg swelling. Negative for palpitations.  Gastrointestinal:  Negative for abdominal distention and abdominal pain.  Endocrine: Negative.   Genitourinary: Negative.   Musculoskeletal:  Negative for back pain.  Skin: Negative.   Allergic/Immunologic: Negative.   Neurological:  Positive for light-headedness (at times). Negative for dizziness.  Hematological:  Negative for adenopathy. Does not bruise/bleed easily.  Psychiatric/Behavioral:  Negative for dysphoric  mood and sleep disturbance (sleeping on 1-2 pillows; wearing trilogy). The patient is nervous/anxious.      Physical Exam Vitals and nursing note reviewed.  Constitutional:      Appearance: He is well-developed.  HENT:     Head: Normocephalic and atraumatic.  Neck:     Vascular: No JVD.  Cardiovascular:     Rate and Rhythm: Bradycardia present. Rhythm irregular.  Pulmonary:     Effort: Pulmonary effort is normal.     Breath sounds: No wheezing, rhonchi or rales.  Abdominal:     Palpations: Abdomen is soft.     Tenderness: There is no abdominal tenderness.  Musculoskeletal:     Cervical back: Normal range of motion and neck supple.     Right lower leg: No tenderness. Edema (trace pitting) present.     Left lower leg: No tenderness. Edema (trace pitting) present.  Skin:    General: Skin is warm and dry.  Neurological:     General: No focal deficit present.     Mental Status: He is alert and oriented to person, place, and time.  Psychiatric:        Mood and Affect: Mood is anxious.        Behavior: Behavior normal.    Assessment & Plan:  1: Chronic heart failure with mildly reduced ejection fraction- - NYHA class II - euvolemic - has scales and just has to get batteries; instructed to  weigh daily and call for an overnight weight gain of > 2 pounds or a weekly weight gain of > 5 pounds - weight 246 pounds from last visit here 2 weeks ago - GDMT of jardiance and metoprolol (although tartrate) - consider adding 12.34m losartan  - BMP today - BNP 12/21/22 was 1985.5  2: Atrial fibrillation- - saw cardiology (Corky Sox 03/15/22 - cardizem 3632m not indicated in reduced EF so may need to consider managing with metoprolol succinate if able - apixaban 72m49mID   3: COPD- - saw pulmonology (WeMelvyn Novasn 01/06/23 - trilogy at bedtime - BMP 12/24/22 showed sodium 139, potassium 3.8, creatinine 1.02 & GFR >60  4: Tobacco use- - smokes daily - cessation discussed - [facility]   Medication bottles reviewed.

## 2023-01-12 NOTE — Telephone Encounter (Signed)
Patient did not show for his Heart Failure Clinic appointment on 01/12/23.

## 2023-01-16 ENCOUNTER — Telehealth: Payer: Self-pay | Admitting: Family

## 2023-01-16 NOTE — Telephone Encounter (Signed)
Called and rescheduled patient after missing his follow up with Otila Kluver. Patient has been rescheduled for 2/26    Avalyn Molino, NT

## 2023-01-23 ENCOUNTER — Encounter: Payer: Self-pay | Admitting: Pharmacist

## 2023-01-23 ENCOUNTER — Encounter: Payer: Self-pay | Admitting: Family

## 2023-01-23 ENCOUNTER — Ambulatory Visit: Payer: Medicaid Other | Attending: Family | Admitting: Family

## 2023-01-23 VITALS — BP 106/65 | HR 96 | Wt 256.4 lb

## 2023-01-23 DIAGNOSIS — Z7984 Long term (current) use of oral hypoglycemic drugs: Secondary | ICD-10-CM | POA: Diagnosis not present

## 2023-01-23 DIAGNOSIS — I480 Paroxysmal atrial fibrillation: Secondary | ICD-10-CM | POA: Diagnosis not present

## 2023-01-23 DIAGNOSIS — I4891 Unspecified atrial fibrillation: Secondary | ICD-10-CM | POA: Diagnosis not present

## 2023-01-23 DIAGNOSIS — J449 Chronic obstructive pulmonary disease, unspecified: Secondary | ICD-10-CM | POA: Diagnosis not present

## 2023-01-23 DIAGNOSIS — F17210 Nicotine dependence, cigarettes, uncomplicated: Secondary | ICD-10-CM | POA: Insufficient documentation

## 2023-01-23 DIAGNOSIS — I5022 Chronic systolic (congestive) heart failure: Secondary | ICD-10-CM

## 2023-01-23 DIAGNOSIS — Z79899 Other long term (current) drug therapy: Secondary | ICD-10-CM | POA: Insufficient documentation

## 2023-01-23 DIAGNOSIS — R5383 Other fatigue: Secondary | ICD-10-CM | POA: Diagnosis present

## 2023-01-23 DIAGNOSIS — I509 Heart failure, unspecified: Secondary | ICD-10-CM | POA: Diagnosis not present

## 2023-01-23 DIAGNOSIS — J4489 Other specified chronic obstructive pulmonary disease: Secondary | ICD-10-CM | POA: Diagnosis not present

## 2023-01-23 DIAGNOSIS — Z7901 Long term (current) use of anticoagulants: Secondary | ICD-10-CM | POA: Insufficient documentation

## 2023-01-23 DIAGNOSIS — R0602 Shortness of breath: Secondary | ICD-10-CM | POA: Diagnosis not present

## 2023-01-23 DIAGNOSIS — F419 Anxiety disorder, unspecified: Secondary | ICD-10-CM | POA: Diagnosis not present

## 2023-01-23 DIAGNOSIS — Z720 Tobacco use: Secondary | ICD-10-CM

## 2023-01-23 MED ORDER — LOSARTAN POTASSIUM 25 MG PO TABS: 12.5000 mg | ORAL_TABLET | Freq: Every day | ORAL | 5 refills | Status: DC

## 2023-01-23 MED ORDER — ALBUTEROL SULFATE HFA 108 (90 BASE) MCG/ACT IN AERS: 2.0000 | INHALATION_SPRAY | Freq: Four times a day (QID) | RESPIRATORY_TRACT | 2 refills | Status: DC | PRN

## 2023-01-23 NOTE — Patient Instructions (Signed)
Start taking losartan as 1/2 tablet every day. This will be in addition to what you are taking now.

## 2023-01-23 NOTE — Progress Notes (Signed)
ReDS Vest / Clip - 01/23/23 1100       ReDS Vest / Clip   Station Marker D    Ruler Value 33    ReDS Value Range Low volume    ReDS Actual Value 31            Kevan Rosebush, RN, BSN, [facility] Specialty Coordinator [facility]

## 2023-01-23 NOTE — Progress Notes (Signed)
Patient ID: Kenneth Benson, male    DOB: 07-25-1961, 62 y.o.   MRN: [id number]  HPI  Kenneth Benson is a 62 y/o male with a history of atrial fibrillation, asthma, COPD, current tobacco use and chronic heart failure.   Echo 12/18/22 showed an EF of 45-50% along with mildly elevated PA pressure and moderate Kenneth/ TR  Admitted 12/16/22 due to AF/ HF exacerbation. Diuresed. Initially on bipap and trilogy to be delivered to patient's home.   He presents today for a follow-up visit with a chief complaint of minimal fatigue with moderate exertion. Describes this as chronic in nature. Has associated cough, SOB, wheezing, pedal edema, anxiety and gradual weight gain along with this. Denies any difficulty sleeping, abdominal distention, palpitations, chest pain or dizziness.   Feels like his wheezing is worse and would like a refill on his albuterol inhaler for PRN use. Weighing daily and notes a gradual weight gain as his appetite has increased greatly. Denies any 3 pound or more overnight weight gain.   Past Medical History:  Diagnosis Date   Arrhythmia    atrial fibrillation   Asthma    CHF (congestive heart failure) (HCC)    COPD (chronic obstructive pulmonary disease) (HCC)    Tobacco abuse    No past surgical history on file. No family history on file. Social History   Tobacco Use   Smoking status: Every Day    Packs/day: 1.50    Years: 27.00    Total pack years: 40.50    Types: Cigarettes   Smokeless tobacco: Not on file   Tobacco comments:    0.5 PPD 01/06/2023 khj  Substance Use Topics   Alcohol use: No   No Known Allergies  Prior to Admission medications   Medication Sig Start Date End Date Taking? Authorizing Provider  albuterol (VENTOLIN HFA) 108 (90 Base) MCG/ACT inhaler Inhale 2 puffs into the lungs every 6 (six) hours as needed for wheezing or shortness of breath. 01/23/23  Yes Darylene Price A, FNP  apixaban (ELIQUIS) 5 MG TABS tablet Take 1 tablet (5 mg total) by mouth 2 (two)  times daily. 12/29/22  Yes Darylene Price A, FNP  diltiazem (CARDIZEM CD) 360 MG 24 hr capsule Take 1 capsule (360 mg total) by mouth daily. 12/29/22  Yes Kush Farabee, Otila Kluver A, FNP  empagliflozin (JARDIANCE) 10 MG TABS tablet Take 1 tablet (10 mg total) by mouth daily. 12/29/22  Yes Darylene Price A, FNP  losartan (COZAAR) 25 MG tablet Take 0.5 tablets (12.5 mg total) by mouth daily. 01/23/23  Yes Darylene Price A, FNP  metoprolol tartrate (LOPRESSOR) 50 MG tablet Take 1 tablet (50 mg total) by mouth 2 (two) times daily. 12/29/22  Yes Merritt Mccravy A, FNP  mometasone-formoterol (DULERA) 200-5 MCG/ACT AERO Inhale 2 puffs into the lungs 2 (two) times daily. 12/24/22  Yes Sharen Hones, MD  torsemide (DEMADEX) 20 MG tablet Take 2 tablets (40 mg total) by mouth daily. 12/29/22  Yes Emireth Cockerham, Otila Kluver A, FNP  albuterol (PROVENTIL HFA) 108 (90 Base) MCG/ACT inhaler Inhale 2 puffs into the lungs every 6 (six) hours as needed for wheezing. Patient not taking: Reported on 01/06/2023 02/11/22   Dwyane Dee, MD  amiodarone (PACERONE) 200 MG tablet Take 1 tablet (200 mg total) by mouth 2 (two) times daily. 02/11/22 03/15/22  Dwyane Dee, MD    Review of Systems  Constitutional:  Positive for fatigue. Negative for appetite change.  HENT:  Negative for congestion, postnasal drip and sore throat.  Eyes: Negative.   Respiratory:  Positive for cough, shortness of breath and wheezing. Negative for chest tightness.   Cardiovascular:  Positive for leg swelling. Negative for chest pain and palpitations.  Gastrointestinal:  Negative for abdominal distention and abdominal pain.  Endocrine: Negative.   Genitourinary: Negative.   Musculoskeletal:  Negative for back pain.  Skin: Negative.   Allergic/Immunologic: Negative.   Neurological:  Negative for dizziness and light-headedness.  Hematological:  Negative for adenopathy. Does not bruise/bleed easily.  Psychiatric/Behavioral:  Negative for dysphoric mood and sleep disturbance (sleeping  on 1-2 pillows; wearing trilogy). The patient is nervous/anxious.    Vitals:   01/23/23 1048  BP: 106/65  Pulse: 96  SpO2: (!) 88%  Weight: 256 lb 6 oz (116.3 kg)   Wt Readings from Last 3 Encounters:  01/23/23 256 lb 6 oz (116.3 kg)  01/06/23 248 lb 3.2 oz (112.6 kg)  12/29/22 246 lb (111.6 kg)   Lab Results  Component Value Date   CREATININE 1.02 12/24/2022   CREATININE 1.08 12/23/2022   CREATININE 1.10 12/22/2022   Physical Exam Vitals and nursing note reviewed.  Constitutional:      Appearance: He is well-developed.  HENT:     Head: Normocephalic and atraumatic.  Neck:     Vascular: No JVD.  Cardiovascular:     Rate and Rhythm: Normal rate. Rhythm irregular.  Pulmonary:     Effort: Pulmonary effort is normal.     Breath sounds: Wheezing (audible; ausculated throughout all lung fields) present. No rhonchi or rales.  Abdominal:     Palpations: Abdomen is soft.     Tenderness: There is no abdominal tenderness.  Musculoskeletal:     Cervical back: Normal range of motion and neck supple.     Right lower leg: No tenderness. Edema (trace pitting) present.     Left lower leg: No tenderness. Edema (trace pitting) present.  Skin:    General: Skin is warm and dry.  Neurological:     General: No focal deficit present.     Mental Status: He is alert and oriented to person, place, and time.  Psychiatric:        Mood and Affect: Mood is anxious.        Behavior: Behavior normal.    Assessment & Plan:  1: Chronic heart failure with mildly reduced ejection fraction- - NYHA class II - euvolemic - weighing daily & notes gradual weight gain; reminded to call for an overnight weight gain of > 2 pounds or a weekly weight gain of > 5 pounds - weight up 10 pounds from last visit here 1 month ago - jardiance '10mg'$  daily - metoprolol tartrate '50mg'$  BID - torsemide '40mg'$  daily - add 12.'5mg'$  losartan daily; current BP may not allow for entresto - BMP next visit - not adding salt to  his food - ReDs clip reading in clinic today was normal at 31% - BNP 12/21/22 was 1985.5 - PharmD reconciled meds w/ patient  2: Atrial fibrillation- - saw cardiology Corky Sox) 03/15/22 - cardizem '360mg'$ ; not indicated in reduced EF so may need to consider managing with metoprolol succinate if able - apixaban '5mg'$  BID  - diltiazem CD '360mg'$  daily  3: COPD- - saw pulmonology Melvyn Novas) on 01/06/23 - trilogy at bedtime - refilled albuterol for PRN use - PFT's scheduled for 02/09/23 - BMP 12/24/22 showed sodium 139, potassium 3.8, creatinine 1.02 & GFR >60  4: Tobacco use- - smokes daily - cessation discussed - appt at [facility] on 04/28/23   Medication bottles reviewed.   Return in 3 weeks, sooner if needed

## 2023-02-02 NOTE — Progress Notes (Signed)
Patient ID: Kenneth Benson, male   DOB: [birth year], 62 y.o.   MRN: [id number]  [place] - PHARMACIST COUNSELING NOTE  *HFmrEF*  Guideline-Directed Medical Therapy/Evidence Based Medicine  ACE/ARB/ARNI: none Beta Blocker: Metoprolol tartrate 50 mg twice daily Aldosterone Antagonist:  none Diuretic: Torsemide 40 mg daily SGLT2i: Empagliflozin 10 mg daily  Adherence Assessment  Do you ever forget to take your medication? '[]'$ Yes '[x]'$ No  Do you ever skip doses due to side effects? '[]'$ Yes '[x]'$ No  Do you have trouble affording your medicines? '[x]'$ Yes '[]'$ No  Are you ever unable to pick up your medication due to transportation difficulties? '[]'$ Yes '[x]'$ No  Do you ever stop taking your medications because you don't believe they are helping? '[]'$ Yes '[x]'$ No  Do you check your weight daily? '[x]'$ Yes '[]'$ No   Adherence strategy: med bottles (no pill box)  Barriers to obtaining medications: none  Vital signs: HR 96, BP 106/65, weight (pounds) 256 lbs  ECHO: Date 12/18/22,   ReDS vest 31 %      Latest Ref Rng & Units 12/24/2022    4:49 AM 12/23/2022    4:41 AM 12/22/2022    5:04 AM  BMP  Glucose 70 - 99 mg/dL 140  153  86   BUN 6 - 20 mg/dL 27  33  36   Creatinine 0.61 - 1.24 mg/dL 1.02  1.08  1.10   Sodium 135 - 145 mmol/L 139  138  140   Potassium 3.5 - 5.1 mmol/L 3.8  3.6  3.6   Chloride 98 - 111 mmol/L 95  92  90   CO2 22 - 32 mmol/L 36  42  42   Calcium 8.9 - 10.3 mg/dL 8.3  8.3  8.6     Past Medical History:  Diagnosis Date   Arrhythmia    atrial fibrillation   Asthma    CHF (congestive heart failure) (HCC)    COPD (chronic obstructive pulmonary disease) ([place])    Tobacco abuse     ASSESSMENT 62 year old male who presents to the HF clinic for follow up visit. PMH includes atrial fibrillation, asthma, COPD, current tobacco use and chronic heart failure. Last acute care admission was Jan/19/2024 for COPD exacerbation. Patient denies  increase SOB, but reports weight gain. He reports daily weight check only for the last 2 weeks.   Medication reconciliation completed during OV. Patient denies ADR to current therapy or issues to afford his medication.  ReDS vest today at 31% shows optimal volume control.  PLAN (recommendations) Start losartan 12.'5mg'$  daily Repeat BMET in 1-2 weeks   Time spent: 15 minutes  Dekisha Mesmer Rodriguez-Guzman PharmD, BCPS 02/02/2023 2:23 PM   Current Outpatient Medications:    albuterol (PROVENTIL HFA) 108 (90 Base) MCG/ACT inhaler, Inhale 2 puffs into the lungs every 6 (six) hours as needed for wheezing. (Patient not taking: Reported on 01/06/2023), Disp: 6.7 g, Rfl: 3   albuterol (VENTOLIN HFA) 108 (90 Base) MCG/ACT inhaler, Inhale 2 puffs into the lungs every 6 (six) hours as needed for wheezing or shortness of breath., Disp: 8 g, Rfl: 2   apixaban (ELIQUIS) 5 MG TABS tablet, Take 1 tablet (5 mg total) by mouth 2 (two) times daily., Disp: 60 tablet, Rfl: 5   diltiazem (CARDIZEM CD) 360 MG 24 hr capsule, Take 1 capsule (360 mg total) by mouth daily., Disp: 30 capsule, Rfl: 5   empagliflozin (JARDIANCE) 10 MG TABS tablet, Take 1 tablet (10 mg total) by mouth daily.,  Disp: 30 tablet, Rfl: 5   losartan (COZAAR) 25 MG tablet, Take 0.5 tablets (12.5 mg total) by mouth daily., Disp: 15 tablet, Rfl: 5   metoprolol tartrate (LOPRESSOR) 50 MG tablet, Take 1 tablet (50 mg total) by mouth 2 (two) times daily., Disp: 60 tablet, Rfl: 5   mometasone-formoterol (DULERA) 200-5 MCG/ACT AERO, Inhale 2 puffs into the lungs 2 (two) times daily., Disp: 13 g, Rfl: 0   torsemide (DEMADEX) 20 MG tablet, Take 2 tablets (40 mg total) by mouth daily., Disp: 60 tablet, Rfl: 5   MEDICATION ADHERENCES TIPS AND STRATEGIES Taking medication as prescribed improves patient outcomes in heart failure (reduces hospitalizations, improves symptoms, increases survival) Side effects of medications can be managed by decreasing doses, switching  agents, stopping drugs, or adding additional therapy. Please let someone in the [facility] know if you have having bothersome side effects so we can modify your regimen. Do not alter your medication regimen without talking to [place].  Medication reminders can help patients remember to take drugs on time. If you are missing or forgetting doses you can try linking behaviors, using pill boxes, or an electronic reminder like an alarm on your phone or an app. Some people can also get automated phone calls as medication reminders.

## 2023-02-09 ENCOUNTER — Ambulatory Visit: Payer: Medicaid Other | Attending: Internal Medicine

## 2023-02-09 DIAGNOSIS — Z7951 Long term (current) use of inhaled steroids: Secondary | ICD-10-CM | POA: Diagnosis not present

## 2023-02-09 DIAGNOSIS — R0602 Shortness of breath: Secondary | ICD-10-CM | POA: Insufficient documentation

## 2023-02-09 DIAGNOSIS — J449 Chronic obstructive pulmonary disease, unspecified: Secondary | ICD-10-CM | POA: Diagnosis not present

## 2023-02-09 DIAGNOSIS — F17210 Nicotine dependence, cigarettes, uncomplicated: Secondary | ICD-10-CM | POA: Insufficient documentation

## 2023-02-09 LAB — PULMONARY FUNCTION TEST ARMC ONLY
DL/VA % pred: 91 %
DL/VA: 3.83 ml/min/mmHg/L
DLCO unc % pred: 59 %
DLCO unc: 17.32 ml/min/mmHg
FEF 25-75 Post: 2.32 L/sec
FEF 25-75 Pre: 0.76 L/sec
FEF2575-%Change-Post: 207 %
FEF2575-%Pred-Post: 73 %
FEF2575-%Pred-Pre: 23 %
FEV1-%Change-Post: 43 %
FEV1-%Pred-Post: 57 %
FEV1-%Pred-Pre: 40 %
FEV1-Post: 2.24 L
FEV1-Pre: 1.57 L
FEV1FVC-%Change-Post: 17 %
FEV1FVC-%Pred-Pre: 78 %
FEV6-%Change-Post: 22 %
FEV6-%Pred-Post: 65 %
FEV6-%Pred-Pre: 53 %
FEV6-Post: 3.21 L
FEV6-Pre: 2.63 L
FEV6FVC-%Change-Post: 0 %
FEV6FVC-%Pred-Post: 104 %
FEV6FVC-%Pred-Pre: 104 %
FVC-%Change-Post: 21 %
FVC-%Pred-Post: 62 %
FVC-%Pred-Pre: 51 %
FVC-Post: 3.21 L
FVC-Pre: 2.64 L
Post FEV1/FVC ratio: 70 %
Post FEV6/FVC ratio: 100 %
Pre FEV1/FVC ratio: 59 %
Pre FEV6/FVC Ratio: 99 %
RV % pred: 154 %
RV: 3.63 L
TLC % pred: 86 %
TLC: 6.38 L

## 2023-02-09 MED ORDER — ALBUTEROL SULFATE (2.5 MG/3ML) 0.083% IN NEBU
2.5000 mg | INHALATION_SOLUTION | Freq: Once | RESPIRATORY_TRACT | Status: AC
  Administered 2023-02-09: 2.5 mg via RESPIRATORY_TRACT

## 2023-02-12 DIAGNOSIS — J4489 Other specified chronic obstructive pulmonary disease: Secondary | ICD-10-CM | POA: Insufficient documentation

## 2023-02-15 ENCOUNTER — Ambulatory Visit (INDEPENDENT_AMBULATORY_CARE_PROVIDER_SITE_OTHER): Payer: Medicaid Other | Admitting: Internal Medicine

## 2023-02-15 ENCOUNTER — Encounter: Payer: Medicaid Other | Admitting: Family

## 2023-02-15 ENCOUNTER — Telehealth: Payer: Self-pay

## 2023-02-15 ENCOUNTER — Encounter: Payer: Self-pay | Admitting: Internal Medicine

## 2023-02-15 ENCOUNTER — Telehealth: Payer: Self-pay | Admitting: Family

## 2023-02-15 VITALS — BP 118/78 | HR 76 | Temp 98.0°F | Ht 73.0 in | Wt 258.2 lb

## 2023-02-15 DIAGNOSIS — J9612 Chronic respiratory failure with hypercapnia: Secondary | ICD-10-CM

## 2023-02-15 DIAGNOSIS — J9611 Chronic respiratory failure with hypoxia: Secondary | ICD-10-CM

## 2023-02-15 DIAGNOSIS — F17210 Nicotine dependence, cigarettes, uncomplicated: Secondary | ICD-10-CM | POA: Diagnosis not present

## 2023-02-15 DIAGNOSIS — J449 Chronic obstructive pulmonary disease, unspecified: Secondary | ICD-10-CM | POA: Diagnosis not present

## 2023-02-15 MED ORDER — ARFORMOTEROL TARTRATE 15 MCG/2ML IN NEBU: 15.0000 ug | INHALATION_SOLUTION | Freq: Two times a day (BID) | RESPIRATORY_TRACT | 10 refills | Status: DC

## 2023-02-15 MED ORDER — ALBUTEROL SULFATE (2.5 MG/3ML) 0.083% IN NEBU: 2.5000 mg | INHALATION_SOLUTION | Freq: Four times a day (QID) | RESPIRATORY_TRACT | 12 refills | Status: DC | PRN

## 2023-02-15 MED ORDER — BUDESONIDE 0.5 MG/2ML IN SUSP: 0.5000 mg | Freq: Two times a day (BID) | RESPIRATORY_TRACT | 0 refills | Status: DC

## 2023-02-15 NOTE — Telephone Encounter (Signed)
Spoke to [employer]. Was advised that RT would need to go out to his home to obtain vent download.

## 2023-02-15 NOTE — Telephone Encounter (Signed)
Patient did not show for his Heart Failure Clinic appointment on 02/15/23

## 2023-02-15 NOTE — Patient Instructions (Addendum)
STOP DULERA  START PULMICORT NEBULIZER TWICE DAILY  START BROVANA NEBULIZER TWICE DAILY  USE ALBUTEROL NEBULIZER AS NEEDED EVERY 6 HRS  USE TRILOGY AS PRESCRIBED EVERY TIME YOU SLEEP  Avoid secondhand smoke Avoid SICK contacts Recommend  Masking  when appropriate Recommend Keep up-to-date with vaccinations   PLEASE STOP SMOKING

## 2023-02-15 NOTE — Progress Notes (Signed)
Kenneth Benson, male    DOB: [birth year]   MRN: [id number]   Discharge Diagnoses:   Paroxysmal atrial fibrillation with RVR (HCC)   Acute hypoxic respiratory failure (HCC)   COPD exacerbation (HCC)   Acute on chronic combined systolic and diastolic CHF   AKI (acute kidney injury) ([place])   Hyperkalemia   Morbid obesity (HCC)   Moderate tricuspid regurgitation       CC Assessment for End stage COPD   HPI 62 year old male with a known history of COPD, asthma, ongoing tobacco abuse, A-fib is recently admitted for rapid A-fib/flutter and acute hypoxic respiratory failure requiring BiPAP Patient was seen cardiology and intensivist,   Seen to today for progressive SOB and DOE Uses Trilogy needs this for survival EDUCATION PROVIDED THAT HE MUST USE THIS EVERY NIGHT  PATIENT UNABLE TO USE TRADITIONAL INHALER THERAPY AND WILL NEED NEBULIZER THERAPY AS MAINTENANCE          No exacerbation at this time No evidence of heart failure at this time No evidence or signs of infection at this time No respiratory distress No fevers, chills, nausea, vomiting, diarrhea No evidence of lower extremity edema No evidence hemoptysis   Smoking Assessment and Cessation Counseling Upon further questioning, Patient smokes 1/2 ppd I have advised patient to quit/stop smoking as soon as possible due to high risk for multiple medical problems  Patient is NOT willing to quit smoking  I have advised patient that we can assist and have options of Nicotine replacement therapy. I also advised patient on behavioral therapy and can provide oral medication therapy in conjunction with the other therapies Follow up next Office visit  for assessment of smoking cessation Smoking cessation counseling advised for 4 minutes   Past Medical History:  Diagnosis Date   Arrhythmia    atrial fibrillation   Asthma    CHF (congestive heart failure) ([place])    COPD (chronic obstructive pulmonary disease) ([place])     Tobacco abuse     Outpatient Medications Prior to Visit  Medication Sig Dispense Refill   apixaban (ELIQUIS) 5 MG TABS tablet Take 1 tablet (5 mg total) by mouth 2 (two) times daily. 60 tablet 5   diltiazem (CARDIZEM CD) 360 MG 24 hr capsule Take 1 capsule (360 mg total) by mouth daily. 30 capsule 5   empagliflozin (JARDIANCE) 10 MG TABS tablet Take 1 tablet (10 mg total) by mouth daily. 30 tablet 5   metoprolol tartrate (LOPRESSOR) 50 MG tablet Take 1 tablet (50 mg total) by mouth 2 (two) times daily. 60 tablet 5   mometasone-formoterol (DULERA) 200-5 MCG/ACT AERO Inhale 2 puffs into the lungs 2 (two) times daily. 13 g 0   torsemide (DEMADEX) 20 MG tablet Take 2 tablets (40 mg total) by mouth daily. 60 tablet 5       3    BP 118/78 (BP Location: Left Arm, Cuff Size: Normal)   Pulse 76   Temp 98 F (36.7 C) (Temporal)   Ht 6\' 1"  (1.854 m)   Wt 258 lb 3.2 oz (117.1 kg)   SpO2 92%   BMI 34.07 kg/m      Review of Systems: Gen:  Ill appearing  HEENT: Denies blurred vision, double vision, ear pain, eye pain, hearing loss, nose bleeds, sore throat Cardiac:  No dizziness, chest pain or heaviness, chest tightness,edema, No JVD Resp:   No cough, -sputum production, +shortness of breath,+wheezing, -hemoptysis,  Other:  All other systems negative   Physical  Examination:   General Appearance: No distress  EYES PERRLA, EOM intact.   NECK Supple, No JVD Pulmonary: normal breath sounds,+ wheezing.  CardiovascularNormal S1,S2.  No m/r/g.   Abdomen: Benign, Soft, non-tender. Neurology UE/LE 5/5 strength, no focal deficits Ext pulses intact, cap refill intact ALL OTHER ROS ARE NEGATIVE   CBC    Component Value Date/Time   WBC 7.3 12/22/2022 0504   RBC 4.81 12/22/2022 0504   HGB 14.0 12/22/2022 0504   HGB 15.3 03/19/2015 1020   HCT 45.1 12/22/2022 0504   HCT 46.7 03/19/2015 1020   PLT 223 12/22/2022 0504   PLT 228 03/19/2015 1020   MCV 93.8 12/22/2022 0504   MCV 92 03/19/2015  1020   MCH 29.1 12/22/2022 0504   MCHC 31.0 12/22/2022 0504   RDW 15.2 12/22/2022 0504   RDW 15.0 (H) 03/19/2015 1020   LYMPHSABS 0.9 12/16/2022 1305   MONOABS 0.7 12/16/2022 1305   EOSABS 0.0 12/16/2022 1305   BASOSABS 0.0 12/16/2022 1305      Latest Ref Rng & Units 12/24/2022    4:49 AM 12/23/2022    4:41 AM 12/22/2022    5:04 AM  BMP  Glucose 70 - 99 mg/dL 140  153  86   BUN 6 - 20 mg/dL 27  33  36   Creatinine 0.61 - 1.24 mg/dL 1.02  1.08  1.10   Sodium 135 - 145 mmol/L 139  138  140   Potassium 3.5 - 5.1 mmol/L 3.8  3.6  3.6   Chloride 98 - 111 mmol/L 95  92  90   CO2 22 - 32 mmol/L 36  42  42   Calcium 8.9 - 10.3 mg/dL 8.3  8.3  8.6      Assessment    Assessment and Plan:  62 year old white male seen today for end-stage COPD with progressive shortness of breath and dyspnea on exertion with a history of paroxysmal A-fib with RVR with acute on chronic combined systolic and diastolic congestive heart failure with moderate tricuspid regurg in the recent setting of hospitalization with morbid obesity respiratory insufficiency and deconditioned state   Respiratory failure Related to COPD and morbid obesity Patient uses trilogy vent at night Education provided that he needs this to survive  End-stage COPD Smoking cessation strongly advised Patient no longer can take traditional inhaler therapy therefore we will switch to Pulmicort nebs and Brovana nebs as maintenance therapy Patient is a high risk for hospitalization  Chronic respiratory insufficiency Continue Trilogy vent therapy and nebulized therapy as maintenance therapy  Paroxysmal atrial fibrillation with RVR ([place]) continue beta-blocker and calcium channel blocker, continue Eliquis.   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 Moderate tricuspid regurgitation. Echocardiogram showed ejection fraction 45 to 50% with moderate tricuspid  regurgitation. Patient  was on BiPAP while in the hospital.  Trilogy has been set up at home. Follow-up cardiology as scheduled    Hyperkalemia AKI (acute kidney injury) ([place]) secondary to ATN. Acute kidney injury probably secondary to contrast and ATN.  Renal function is overall has normalized.  Potassium normalized.   Morbid obesity. Patient BMI is 36.74, patient has morbid obesity due to comorbidities with congestive heart failure.    Smoking cessation strongly advised    MEDICATION ADJUSTMENTS/LABS AND TESTS ORDERED: STOP DULERA  START PULMICORT NEBULIZER TWICE DAILY  START BROVANA NEBULIZER TWICE DAILY  USE ALBUTEROL NEBULIZER AS NEEDED EVERY 6 HRS  USE TRILOGY AS PRESCRIBED EVERY TIME  YOU SLEEP  Avoid secondhand smoke Avoid SICK contacts Recommend  Masking  when appropriate Recommend Keep up-to-date with vaccinations   PLEASE STOP SMOKING   CURRENT MEDICATIONS REVIEWED AT LENGTH WITH PATIENT TODAY   Patient  satisfied with Plan of action and management. All questions answered  Follow up 3 months  Total Time Spent  [place], M.D.  [facility]  Medical Director [place] Director [facility] Cardio-Pulmonary Department

## 2023-02-15 NOTE — Progress Notes (Deleted)
Patient ID: VINT POLA, male    DOB: [birth year], 62 y.o.   MRN: [id number]  HPI  Mr Hetland is a 62 y/o male with a history of atrial fibrillation, asthma, COPD, current tobacco use and chronic heart failure.   Echo 12/18/22 showed an EF of 45-50% along with mildly elevated PA pressure and moderate MR/ TR  Admitted 12/16/22 due to AF/ HF exacerbation. Diuresed. Initially on bipap and trilogy to be delivered to patient's home.   He presents today for a HF follow-up visit with a chief complaint of    Losartan 12.5mg  was started at last visit.  Past Medical History:  Diagnosis Date   Arrhythmia    atrial fibrillation   Asthma    CHF (congestive heart failure) (HCC)    COPD (chronic obstructive pulmonary disease) (HCC)    Tobacco abuse    No past surgical history on file. No family history on file. Social History   Tobacco Use   Smoking status: Every Day    Packs/day: 1.50    Years: 27.00    Additional pack years: 0.00    Total pack years: 40.50    Types: Cigarettes   Smokeless tobacco: Not on file   Tobacco comments:    0.5 PPD 01/06/2023 khj  Substance Use Topics   Alcohol use: No   No Known Allergies    Review of Systems  Constitutional:  Positive for fatigue. Negative for appetite change.  HENT:  Negative for congestion, postnasal drip and sore throat.   Eyes: Negative.   Respiratory:  Positive for cough, shortness of breath and wheezing. Negative for chest tightness.   Cardiovascular:  Positive for leg swelling. Negative for chest pain and palpitations.  Gastrointestinal:  Negative for abdominal distention and abdominal pain.  Endocrine: Negative.   Genitourinary: Negative.   Musculoskeletal:  Negative for back pain.  Skin: Negative.   Allergic/Immunologic: Negative.   Neurological:  Negative for dizziness and light-headedness.  Hematological:  Negative for adenopathy. Does not bruise/bleed easily.  Psychiatric/Behavioral:  Negative for dysphoric mood and  sleep disturbance (sleeping on 1-2 pillows; wearing trilogy). The patient is nervous/anxious.      Physical Exam Vitals and nursing note reviewed.  Constitutional:      Appearance: He is well-developed.  HENT:     Head: Normocephalic and atraumatic.  Neck:     Vascular: No JVD.  Cardiovascular:     Rate and Rhythm: Normal rate. Rhythm irregular.  Pulmonary:     Effort: Pulmonary effort is normal.     Breath sounds: Wheezing (audible; ausculated throughout all lung fields) present. No rhonchi or rales.  Abdominal:     Palpations: Abdomen is soft.     Tenderness: There is no abdominal tenderness.  Musculoskeletal:     Cervical back: Normal range of motion and neck supple.     Right lower leg: No tenderness. Edema (trace pitting) present.     Left lower leg: No tenderness. Edema (trace pitting) present.  Skin:    General: Skin is warm and dry.  Neurological:     General: No focal deficit present.     Mental Status: He is alert and oriented to person, place, and time.  Psychiatric:        Mood and Affect: Mood is anxious.        Behavior: Behavior normal.    Assessment & Plan:  1: Chronic heart failure with mildly reduced ejection fraction- - NYHA class II - euvolemic - weighing daily;  reminded to call for an overnight weight gain of > 2 pounds or a weekly weight gain of > 5 pounds - weight 256.6 pounds from last visit here 3 weeks ago  - jardiance 10mg  daily - metoprolol tartrate 50mg  BID - torsemide 40mg  daily - losartan 12.5mg  daily - BMP today - not adding salt to his food  - BNP 12/21/22 was 1985.5   2: Atrial fibrillation- - saw cardiology Corky Sox) 03/15/22 - cardizem 360mg ; not indicated in reduced EF so may need to consider managing with metoprolol succinate if able - apixaban 5mg  BID  - diltiazem CD 360mg  daily  3: COPD- - saw pulmonology Melvyn Novas) on 01/06/23 - trilogy at bedtime - PFT's scheduled for 02/09/23 - BMP 12/24/22 showed sodium 139, potassium 3.8,  creatinine 1.02 & GFR >60  4: Tobacco use- - smokes daily - cessation discussed - appt at [facility] on 04/28/23

## 2023-02-16 NOTE — Progress Notes (Signed)
Called the pt and there was no answer- LMTCB    

## 2023-02-23 NOTE — Progress Notes (Signed)
Pt seen by Dr Mortimer Fries on 02/15/23

## 2023-02-27 ENCOUNTER — Encounter: Payer: Medicaid Other | Admitting: Family

## 2023-02-27 ENCOUNTER — Telehealth: Payer: Self-pay | Admitting: Family

## 2023-02-27 NOTE — Telephone Encounter (Signed)
Patient did not show for his Heart Failure Clinic appointment on 02/27/23.

## 2023-04-12 ENCOUNTER — Other Ambulatory Visit: Payer: Self-pay

## 2023-04-13 ENCOUNTER — Emergency Department: Payer: Medicaid Other

## 2023-04-13 ENCOUNTER — Other Ambulatory Visit: Payer: Self-pay

## 2023-04-13 ENCOUNTER — Encounter: Payer: Self-pay | Admitting: Internal Medicine

## 2023-04-13 ENCOUNTER — Inpatient Hospital Stay
Admission: EM | Admit: 2023-04-13 | Discharge: 2023-04-17 | DRG: 291 | Disposition: A | Discharge status: Home / Self Care | Payer: Medicaid Other | Attending: Internal Medicine | Admitting: Internal Medicine

## 2023-04-13 DIAGNOSIS — Z7951 Long term (current) use of inhaled steroids: Secondary | ICD-10-CM | POA: Diagnosis not present

## 2023-04-13 DIAGNOSIS — I110 Hypertensive heart disease with heart failure: Secondary | ICD-10-CM | POA: Diagnosis not present

## 2023-04-13 DIAGNOSIS — J441 Chronic obstructive pulmonary disease with (acute) exacerbation: Secondary | ICD-10-CM | POA: Diagnosis not present

## 2023-04-13 DIAGNOSIS — I5A Non-ischemic myocardial injury (non-traumatic): Secondary | ICD-10-CM | POA: Diagnosis present

## 2023-04-13 DIAGNOSIS — R911 Solitary pulmonary nodule: Secondary | ICD-10-CM | POA: Diagnosis present

## 2023-04-13 DIAGNOSIS — Z1152 Encounter for screening for COVID-19: Secondary | ICD-10-CM | POA: Diagnosis not present

## 2023-04-13 DIAGNOSIS — I509 Heart failure, unspecified: Secondary | ICD-10-CM

## 2023-04-13 DIAGNOSIS — Z6834 Body mass index (BMI) 34.0-34.9, adult: Secondary | ICD-10-CM

## 2023-04-13 DIAGNOSIS — Z7901 Long term (current) use of anticoagulants: Secondary | ICD-10-CM

## 2023-04-13 DIAGNOSIS — F17210 Nicotine dependence, cigarettes, uncomplicated: Secondary | ICD-10-CM | POA: Diagnosis present

## 2023-04-13 DIAGNOSIS — Z79899 Other long term (current) drug therapy: Secondary | ICD-10-CM

## 2023-04-13 DIAGNOSIS — Z7984 Long term (current) use of oral hypoglycemic drugs: Secondary | ICD-10-CM | POA: Diagnosis not present

## 2023-04-13 DIAGNOSIS — E876 Hypokalemia: Secondary | ICD-10-CM | POA: Diagnosis present

## 2023-04-13 DIAGNOSIS — E669 Obesity, unspecified: Secondary | ICD-10-CM | POA: Diagnosis present

## 2023-04-13 DIAGNOSIS — Z8249 Family history of ischemic heart disease and other diseases of the circulatory system: Secondary | ICD-10-CM

## 2023-04-13 DIAGNOSIS — I480 Paroxysmal atrial fibrillation: Secondary | ICD-10-CM | POA: Diagnosis present

## 2023-04-13 DIAGNOSIS — R0602 Shortness of breath: Secondary | ICD-10-CM | POA: Diagnosis not present

## 2023-04-13 DIAGNOSIS — J449 Chronic obstructive pulmonary disease, unspecified: Secondary | ICD-10-CM

## 2023-04-13 DIAGNOSIS — I5023 Acute on chronic systolic (congestive) heart failure: Secondary | ICD-10-CM | POA: Diagnosis present

## 2023-04-13 DIAGNOSIS — I081 Rheumatic disorders of both mitral and tricuspid valves: Secondary | ICD-10-CM | POA: Diagnosis present

## 2023-04-13 DIAGNOSIS — I10 Essential (primary) hypertension: Secondary | ICD-10-CM | POA: Diagnosis present

## 2023-04-13 HISTORY — DX: Hypokalemia: E87.6

## 2023-04-13 HISTORY — DX: Non-ischemic myocardial injury (non-traumatic): I5A

## 2023-04-13 LAB — TROPONIN I (HIGH SENSITIVITY)
Troponin I (High Sensitivity): 37 ng/L — ABNORMAL HIGH (ref <18)
Troponin I (High Sensitivity): 37 ng/L — ABNORMAL HIGH (ref <18)

## 2023-04-13 LAB — MAGNESIUM: Magnesium: 2.3 mg/dL (ref 1.7–2.4)

## 2023-04-13 LAB — CBC
HCT: 49.6 % (ref 39.0–52.0)
Hemoglobin: 15.1 g/dL (ref 13.0–17.0)
MCH: 29.4 pg (ref 26.0–34.0)
MCHC: 30.4 g/dL (ref 30.0–36.0)
MCV: 96.7 fL (ref 80.0–100.0)
Platelets: 215 10*3/uL (ref 150–400)
RBC: 5.13 MIL/uL (ref 4.22–5.81)
RDW: 15.4 % (ref 11.5–15.5)
WBC: 5.3 10*3/uL (ref 4.0–10.5)
nRBC: 0 % (ref 0.0–0.2)

## 2023-04-13 LAB — BASIC METABOLIC PANEL
Anion gap: 11 (ref 5–15)
BUN: 13 mg/dL (ref 6–20)
CO2: 34 mmol/L — ABNORMAL HIGH (ref 22–32)
Calcium: 8.6 mg/dL — ABNORMAL LOW (ref 8.9–10.3)
Chloride: 96 mmol/L — ABNORMAL LOW (ref 98–111)
Creatinine, Ser: 0.98 mg/dL (ref 0.61–1.24)
GFR, Estimated: >60 mL/min (ref >60)
Glucose, Bld: 121 mg/dL — ABNORMAL HIGH (ref 70–99)
Potassium: 3.3 mmol/L — ABNORMAL LOW (ref 3.5–5.1)
Sodium: 141 mmol/L (ref 135–145)

## 2023-04-13 LAB — HEMOGLOBIN A1C
Hgb A1c MFr Bld: 6 % — ABNORMAL HIGH (ref 4.8–5.6)
Mean Plasma Glucose: 125.5 mg/dL

## 2023-04-13 LAB — SARS CORONAVIRUS 2 BY RT PCR: SARS Coronavirus 2 by RT PCR: NEGATIVE

## 2023-04-13 LAB — BRAIN NATRIURETIC PEPTIDE: B Natriuretic Peptide: 1178.2 pg/mL — ABNORMAL HIGH (ref 0.0–100.0)

## 2023-04-13 MED ORDER — ACETAMINOPHEN 325 MG PO TABS: 650.0000 mg | ORAL_TABLET | Freq: Four times a day (QID) | ORAL | Status: DC | PRN

## 2023-04-13 MED ORDER — IPRATROPIUM-ALBUTEROL 0.5-2.5 (3) MG/3ML IN SOLN
3.0000 mL | Freq: Once | RESPIRATORY_TRACT | Status: AC
  Administered 2023-04-13: 3 mL via RESPIRATORY_TRACT
  Filled 2023-04-13: qty 3

## 2023-04-13 MED ORDER — EMPAGLIFLOZIN 10 MG PO TABS
10.0000 mg | ORAL_TABLET | Freq: Every day | ORAL | Status: DC
  Administered 2023-04-14 – 2023-04-17 (×4): 10 mg via ORAL
  Filled 2023-04-13 (×4): qty 1

## 2023-04-13 MED ORDER — POTASSIUM CHLORIDE CRYS ER 20 MEQ PO TBCR
60.0000 meq | EXTENDED_RELEASE_TABLET | Freq: Once | ORAL | Status: AC
  Administered 2023-04-13: 60 meq via ORAL
  Filled 2023-04-13: qty 3

## 2023-04-13 MED ORDER — ALBUTEROL SULFATE (2.5 MG/3ML) 0.083% IN NEBU
2.5000 mg | INHALATION_SOLUTION | RESPIRATORY_TRACT | Status: DC | PRN
  Administered 2023-04-13 – 2023-04-16 (×5): 2.5 mg via RESPIRATORY_TRACT
  Filled 2023-04-13 (×5): qty 3

## 2023-04-13 MED ORDER — DILTIAZEM HCL ER COATED BEADS 180 MG PO CP24
360.0000 mg | ORAL_CAPSULE | Freq: Every day | ORAL | Status: DC
  Administered 2023-04-15 – 2023-04-17 (×3): 360 mg via ORAL
  Filled 2023-04-13 (×3): qty 2

## 2023-04-13 MED ORDER — FUROSEMIDE 10 MG/ML IJ SOLN
60.0000 mg | Freq: Two times a day (BID) | INTRAMUSCULAR | Status: DC
  Administered 2023-04-13: 60 mg via INTRAVENOUS
  Filled 2023-04-13: qty 6

## 2023-04-13 MED ORDER — IPRATROPIUM-ALBUTEROL 0.5-2.5 (3) MG/3ML IN SOLN
3.0000 mL | RESPIRATORY_TRACT | Status: DC
  Administered 2023-04-13 (×2): 3 mL via RESPIRATORY_TRACT
  Filled 2023-04-13 (×2): qty 3

## 2023-04-13 MED ORDER — NICOTINE 21 MG/24HR TD PT24
21.0000 mg | MEDICATED_PATCH | Freq: Every day | TRANSDERMAL | Status: DC
  Filled 2023-04-13 (×3): qty 1

## 2023-04-13 MED ORDER — HYDRALAZINE HCL 20 MG/ML IJ SOLN: 5.0000 mg | INTRAMUSCULAR | Status: DC | PRN

## 2023-04-13 MED ORDER — METHYLPREDNISOLONE SODIUM SUCC 125 MG IJ SOLR
125.0000 mg | Freq: Once | INTRAMUSCULAR | Status: AC
  Administered 2023-04-13: 125 mg via INTRAVENOUS
  Filled 2023-04-13: qty 2

## 2023-04-13 MED ORDER — APIXABAN 5 MG PO TABS
5.0000 mg | ORAL_TABLET | Freq: Two times a day (BID) | ORAL | Status: DC
  Administered 2023-04-13 – 2023-04-17 (×8): 5 mg via ORAL
  Filled 2023-04-13 (×8): qty 1

## 2023-04-13 MED ORDER — IPRATROPIUM-ALBUTEROL 0.5-2.5 (3) MG/3ML IN SOLN
3.0000 mL | Freq: Four times a day (QID) | RESPIRATORY_TRACT | Status: DC
  Administered 2023-04-14 – 2023-04-16 (×10): 3 mL via RESPIRATORY_TRACT
  Filled 2023-04-13 (×10): qty 3

## 2023-04-13 MED ORDER — FUROSEMIDE 10 MG/ML IJ SOLN
60.0000 mg | Freq: Once | INTRAMUSCULAR | Status: AC
  Administered 2023-04-13: 60 mg via INTRAVENOUS
  Filled 2023-04-13: qty 8

## 2023-04-13 MED ORDER — METOPROLOL TARTRATE 50 MG PO TABS
50.0000 mg | ORAL_TABLET | Freq: Two times a day (BID) | ORAL | Status: DC
  Administered 2023-04-13 – 2023-04-17 (×6): 50 mg via ORAL
  Filled 2023-04-13 (×7): qty 1

## 2023-04-13 MED ORDER — AMIODARONE LOAD VIA INFUSION
150.0000 mg | Freq: Once | INTRAVENOUS | Status: AC
  Administered 2023-04-13: 150 mg via INTRAVENOUS
  Filled 2023-04-13: qty 83.34

## 2023-04-13 MED ORDER — AMIODARONE HCL IN DEXTROSE 360-4.14 MG/200ML-% IV SOLN
30.0000 mg/h | INTRAVENOUS | Status: DC
  Administered 2023-04-14 (×2): 30 mg/h via INTRAVENOUS
  Filled 2023-04-13 (×3): qty 200

## 2023-04-13 MED ORDER — AMIODARONE HCL IN DEXTROSE 360-4.14 MG/200ML-% IV SOLN
60.0000 mg/h | INTRAVENOUS | Status: AC
  Administered 2023-04-13 (×2): 60 mg/h via INTRAVENOUS
  Filled 2023-04-13 (×2): qty 200

## 2023-04-13 MED ORDER — DM-GUAIFENESIN ER 30-600 MG PO TB12: 1.0000 | ORAL_TABLET | Freq: Two times a day (BID) | ORAL | Status: DC | PRN

## 2023-04-13 MED ORDER — DIPHENHYDRAMINE HCL 50 MG/ML IJ SOLN: 12.5000 mg | Freq: Three times a day (TID) | INTRAMUSCULAR | Status: DC | PRN

## 2023-04-13 MED ORDER — DILTIAZEM HCL-DEXTROSE 125-5 MG/125ML-% IV SOLN (PREMIX): 5.0000 mg/h | INTRAVENOUS | Status: DC

## 2023-04-13 NOTE — ED Notes (Signed)
Pt's HR 120-140's sustained; in A-fib with frequent multifocal PVCs; attending X. Niu notified via secure chat as no current PRN's to address HR & hypotension.

## 2023-04-13 NOTE — ED Notes (Signed)
Pt was laying on his side during both BP readings.

## 2023-04-13 NOTE — ED Notes (Signed)
Pt reading 90% SpO2; to room and pt had  pulled to side of face at cheek; prompted to put back in nares; inc to 4L.

## 2023-04-13 NOTE — ED Notes (Signed)
Have adjusted pt's IV line multiple times as keeps setting off pump alarm.

## 2023-04-13 NOTE — ED Triage Notes (Signed)
Pt comes via [facility] from home with c/o sob. Pt took 2 albuterol treatment at home with no relief. Pt is on 2L currently with lungs clear. Pt is smoker per EMs.   118/90 O2- 90 on RA and was then placed on 2L by EMs  Pt does have hx of HTN and afib.

## 2023-04-13 NOTE — ED Notes (Signed)
Pt received dinner tray from dietary staff.

## 2023-04-13 NOTE — ED Provider Notes (Signed)
[facility] Provider Note    Event Date/Time   First MD Initiated Contact with Patient 04/13/23 1206     (approximate)  History   Chief Complaint: Shortness of Breath  HPI  Kenneth Benson is a 62 y.o. male with a past medical of asthma, CHF, COPD presents to the emergency department for worsening shortness of breath.  Patient states over the past 3 to 4 days he has had worsening shortness of breath occasional cough.  Patient is a daily smoker.  He is also noted some increased swelling in his feet.  Denies any chest pain.  No known fever.  Patient has been using breathing treatments at home without relief.  Patient currently satting 90% on room air with no baseline O2 requirement placed on 2 L satting in the low to mid 90s.  Physical Exam   Triage Vital Signs: ED Triage Vitals  Enc Vitals Group     BP 04/13/23 1033 (!) 137/95     Pulse Rate 04/13/23 1033 (!) 58     Resp 04/13/23 1033 19     Temp 04/13/23 1033 98.4 F (36.9 C)     Temp src --      SpO2 04/13/23 1033 97 %     Weight --      Height --      Head Circumference --      Peak Flow --      Pain Score 04/13/23 1028 0     Pain Loc --      Pain Edu? --      Excl. in GC? --     Most recent vital signs: Vitals:   04/13/23 1033 04/13/23 1200  BP: (!) 137/95 101/79  Pulse: (!) 58 (!) 104  Resp: 19 20  Temp: 98.4 F (36.9 C)   SpO2: 97% 94%    General: Awake, no distress.  CV:  Good peripheral perfusion.  Regular rate and rhythm  Resp:  Mild tachypnea with moderate expiratory wheeze in all lung fields. Abd:  No distention.  Soft, nontender.  No rebound or guarding.  ED Results / Procedures / Treatments   EKG  EKG viewed and interpreted by myself shows atrial fibrillation with a rapid ventricular response at 135 bpm with a narrow QRS, normal axis, normal intervals, nonspecific ST changes.  RADIOLOGY  I have reviewed and interpreted chest x-ray images.  No consolidation seen on my  evaluation. Radiology is read the chest x-ray is pulmonary vascular congestion as well as a hazy left lung opacity.   MEDICATIONS ORDERED IN ED: Medications  ipratropium-albuterol (DUONEB) 0.5-2.5 (3) MG/3ML nebulizer solution 3 mL (3 mLs Nebulization Given 04/13/23 1235)  ipratropium-albuterol (DUONEB) 0.5-2.5 (3) MG/3ML nebulizer solution 3 mL (3 mLs Nebulization Given 04/13/23 1235)  methylPREDNISolone sodium succinate (SOLU-MEDROL) 125 mg/2 mL injection 125 mg (125 mg Intravenous Given 04/13/23 1235)     IMPRESSION / MDM / ASSESSMENT AND PLAN / ED COURSE  I reviewed the triage vital signs and the nursing notes.  Patient's presentation is most consistent with acute presentation with potential threat to life or bodily function.  Patient presents to the emergency department for worsening shortness of breath cough over the last 3 to 4 days.  Patient has moderate expiratory wheeze in all lung fields on my exam, concerning for possible COPD exacerbation.  Patient also has a history of CHF and states increased lower extremity edema concerning for possible CHF exacerbation.  X-ray shows possible hazy opacity in  the left lung base could represent infection versus atelectasis as well as pulmonary vascular congestion.  Will obtain a CT scan of the chest to further evaluate for possible occult pneumonia versus CHF/pulmonary edema.  Patient's labs show reassuring chemistry besides elevated bicarb level, CBC is reassuring, troponin and BNP are pending.  We will dose DuoNebs given the diffuse expiratory wheeze, IV Solu-Medrol.  Will continue to closely monitor while awaiting results.  CT scan shows signs of pulmonary edema which appears mild.  Will dose IV Lasix.  Suspect the patient symptoms are more likely related to COPD exacerbation given the patient's moderate expiratory wheeze in all lung fields.  Patient received IV Solu-Medrol and DuoNebs with admit to the hospital service for ongoing workup and  treatment.  FINAL CLINICAL IMPRESSION(S) / ED DIAGNOSES   Dyspnea COPD exacerbation CHF exacerbation  Note:  This document was prepared using Dragon voice recognition software and may include unintentional dictation errors.   Minna Antis, MD 04/13/23 1444

## 2023-04-13 NOTE — ED Notes (Signed)
Pt keeps removing [place] while sleeping; adjusted again & pt educated; pt reports chest feels sore/tight and is experiencing mild wheezing.

## 2023-04-13 NOTE — H&P (Signed)
History and Physical    Kenneth Benson ZOX:[id number] DOB: 06-13-1961 DOA: 04/13/2023  Referring MD/NP/PA:   PCP: Pcp, No   Patient coming from:  The patient is coming from home.     Chief Complaint: Shortness of breath  HPI: Kenneth Benson is a 62 y.o. male with medical history significant of CHF with EF of 45-50%, hypertension, COPD/asthma, atrial fibrillation on Eliquis, obesity, tobacco abuse, who presents with shortness of breath.  Patient states that he has shortness of breath for more than 4 days, which has been progressively worsening.  He has dry cough, no chest pain, fever or chills.  He has worsening bilateral leg edema.  He has loose stool, no nausea, vomiting or abdominal pain.  No active diarrhea.  Denies symptoms of UTI.  Patient is not using oxygen normally, found to have oxygen saturation 90% on room air, which improved to 93% on 2 L oxygen.  Data reviewed independently and ED Course: pt was found to have BNP 1178, WBC 5.3, GFR> 60, trop 37 --> 37, potassium 3.3, temperature normal, blood pressure 113/87, heart rate 50, 104, RR 20, oxygen saturation 96% 2 L oxygen.  Chest x-ray showed cardiomegaly and vascular congestion and left basilar hazy opacity.   CT-chest  1. Cardiomegaly with trace LEFT pleural effusion, bronchial wall thickening and mild interlobular septal thickening. Findings are favored to reflect mild pulmonary edema. 2. Enlargement of the main pulmonary artery in relation to the ascending thoracic aorta, which can be seen in the setting of pulmonary arterial hypertension. 3. Mild mediastinal adenopathy, likely reactive. 4. Mildly lobular liver contours with separation of the fissures. This is nonspecific but can be seen in the setting of cirrhosis. 5. Small volume free fluid of the upper abdomen. 6. Predominately LEFT-sided coronary artery atherosclerotic calcifications. 7. Incidental 4 mm LEFT upper lobe pulmonary nodule. No follow-up needed if patient  is low-risk.This recommendation follows the consensus statement: Guidelines for Management of Incidental Pulmonary Nodules Detected on CT Images: From the Fleischner Society 2017; Radiology 2017; 284:228-243.     EKG: I have personally reviewed.  Atrial fibrillation, QTc 507, heart rate 135, frequent PVC, poor R wave progression   Review of Systems:   General: no fevers, chills, no body weight gain, has fatigue HEENT: no blurry vision, hearing changes or sore throat Respiratory: has dyspnea, coughing, no wheezing CV: no chest pain, no palpitations GI: no nausea, vomiting, abdominal pain, diarrhea, constipation GU: no dysuria, burning on urination, increased urinary frequency, hematuria  Ext: has leg edema Neuro: no unilateral weakness, numbness, or tingling, no vision change or hearing loss Skin: no rash, no skin tear. MSK: No muscle spasm, no deformity, no limitation of range of movement in spin Heme: No easy bruising.  Travel history: No recent long distant travel.   Allergy: No Known Allergies  Past Medical History:  Diagnosis Date   Arrhythmia    atrial fibrillation   Asthma    CHF (congestive heart failure) (HCC)    COPD (chronic obstructive pulmonary disease) (HCC)    Tobacco abuse     History reviewed. No pertinent surgical history.  Social History:  reports that he has been smoking cigarettes. He has a 40.50 pack-year smoking history. He does not have any smokeless tobacco history on file. He reports that he does not drink alcohol and does not use drugs.  Family History:  Family History  Problem Relation Age of Onset   Heart disease Mother    Atrial fibrillation Father  Prior to Admission medications   Medication Sig Start Date End Date Taking? Authorizing Provider  albuterol (PROVENTIL) (2.5 MG/3ML) 0.083% nebulizer solution Take 3 mLs (2.5 mg total) by nebulization every 6 (six) hours as needed for wheezing or shortness of breath. 02/15/23   Erin Fulling, MD  albuterol (VENTOLIN HFA) 108 (90 Base) MCG/ACT inhaler Inhale 2 puffs into the lungs every 6 (six) hours as needed for wheezing or shortness of breath. 01/23/23   Delma Freeze, FNP  apixaban (ELIQUIS) 5 MG TABS tablet Take 1 tablet (5 mg total) by mouth 2 (two) times daily. 12/29/22   Delma Freeze, FNP  arformoterol (BROVANA) 15 MCG/2ML NEBU Take 2 mLs (15 mcg total) by nebulization 2 (two) times daily. 02/15/23   Erin Fulling, MD  budesonide (PULMICORT) 0.5 MG/2ML nebulizer solution Take 2 mLs (0.5 mg total) by nebulization 2 (two) times daily. 02/15/23 02/15/24  Erin Fulling, MD  diltiazem (CARDIZEM CD) 360 MG 24 hr capsule Take 1 capsule (360 mg total) by mouth daily. 12/29/22   Delma Freeze, FNP  empagliflozin (JARDIANCE) 10 MG TABS tablet Take 1 tablet (10 mg total) by mouth daily. 12/29/22   Delma Freeze, FNP  losartan (COZAAR) 25 MG tablet Take 0.5 tablets (12.5 mg total) by mouth daily. 01/23/23   Delma Freeze, FNP  metoprolol tartrate (LOPRESSOR) 50 MG tablet Take 1 tablet (50 mg total) by mouth 2 (two) times daily. 12/29/22   Delma Freeze, FNP  mometasone-formoterol (DULERA) 200-5 MCG/ACT AERO Inhale 2 puffs into the lungs 2 (two) times daily. 12/24/22   Marrion Coy, MD  torsemide (DEMADEX) 20 MG tablet Take 2 tablets (40 mg total) by mouth daily. 12/29/22   Delma Freeze, FNP  amiodarone (PACERONE) 200 MG tablet Take 1 tablet (200 mg total) by mouth 2 (two) times daily. 02/11/22 03/15/22  Lewie Chamber, MD    Physical Exam: Vitals:   04/13/23 1602 04/13/23 1603 04/13/23 1605 04/13/23 1606  BP:  93/73    Pulse:   63   Resp:  20    Temp:      SpO2: 94%   100%   General: Not in acute distress HEENT:       Eyes: PERRL, EOMI, no scleral icterus.       ENT: No discharge from the ears and nose, no pharynx injection, no tonsillar enlargement.        Neck: positive JVD, no bruit, no mass felt. Heme: No neck lymph node enlargement. Cardiac: S1/S2, irregularly irregular  rhythm,, No gallops or rubs. Respiratory: Has decreased air movement bilaterally, has fine crackles bilaterally, no wheezing or rhonchi GI: Soft, nondistended, nontender, no rebound pain, no organomegaly, BS present. GU: No hematuria Ext: 2+ pitting leg edema bilaterally. 1+DP/PT pulse bilaterally. Musculoskeletal: No joint deformities, No joint redness or warmth, no limitation of ROM in spin. Skin: No rashes.  Neuro: Alert, oriented X3, cranial nerves II-XII grossly intact, moves all extremities normally Psych: Patient is not psychotic, no suicidal or hemocidal ideation.  Labs on Admission: I have personally reviewed following labs and imaging studies  CBC: Recent Labs  Lab 04/13/23 1037  WBC 5.3  HGB 15.1  HCT 49.6  MCV 96.7  PLT 215   Basic Metabolic Panel: Recent Labs  Lab 04/13/23 1037 04/13/23 1412  NA 141  --   K 3.3*  --   CL 96*  --   CO2 34*  --   GLUCOSE 121*  --   BUN  13  --   CREATININE 0.98  --   CALCIUM 8.6*  --   MG  --  2.3   GFR: CrCl cannot be calculated (Unknown ideal weight.). Liver Function Tests: No results for input(s): "AST", "ALT", "ALKPHOS", "BILITOT", "PROT", "ALBUMIN" in the last 168 hours. No results for input(s): "LIPASE", "AMYLASE" in the last 168 hours. No results for input(s): "AMMONIA" in the last 168 hours. Coagulation Profile: No results for input(s): "INR", "PROTIME" in the last 168 hours. Cardiac Enzymes: No results for input(s): "CKTOTAL", "CKMB", "CKMBINDEX", "TROPONINI" in the last 168 hours. BNP (last 3 results) No results for input(s): "PROBNP" in the last 8760 hours. HbA1C: No results for input(s): "HGBA1C" in the last 72 hours. CBG: No results for input(s): "GLUCAP" in the last 168 hours. Lipid Profile: No results for input(s): "CHOL", "HDL", "LDLCALC", "TRIG", "CHOLHDL", "LDLDIRECT" in the last 72 hours. Thyroid Function Tests: No results for input(s): "TSH", "T4TOTAL", "FREET4", "T3FREE", "THYROIDAB" in the last  72 hours. Anemia Panel: No results for input(s): "VITAMINB12", "FOLATE", "FERRITIN", "TIBC", "IRON", "RETICCTPCT" in the last 72 hours. Urine analysis:    Component Value Date/Time   COLORURINE YELLOW (A) 12/16/2022 1305   APPEARANCEUR CLEAR (A) 12/16/2022 1305   LABSPEC 1.026 12/16/2022 1305   PHURINE 7.0 12/16/2022 1305   GLUCOSEU NEGATIVE 12/16/2022 1305   HGBUR NEGATIVE 12/16/2022 1305   BILIRUBINUR NEGATIVE 12/16/2022 1305   KETONESUR NEGATIVE 12/16/2022 1305   PROTEINUR 30 (A) 12/16/2022 1305   NITRITE NEGATIVE 12/16/2022 1305   LEUKOCYTESUR NEGATIVE 12/16/2022 1305   Sepsis Labs: @LABRCNTIP (procalcitonin:4,lacticidven:4) )No results found for this or any previous visit (from the past 240 hour(s)).   Radiological Exams on Admission: CT CHEST WO CONTRAST  Result Date: 04/13/2023 CLINICAL DATA:  Respiratory illness, nondiagnostic xray EXAM: CT CHEST WITHOUT CONTRAST TECHNIQUE: Multidetector CT imaging of the chest was performed following the standard protocol without IV contrast. RADIATION DOSE REDUCTION: This exam was performed according to the departmental dose-optimization program which includes automated exposure control, adjustment of the mA and/or kV according to patient size and/or use of iterative reconstruction technique. COMPARISON:  January 30, 2022 FINDINGS: Evaluation is limited by respiratory motion. Cardiovascular: Cardiomegaly. Predominately LEFT-sided coronary artery atherosclerotic calcifications. Nonaneurysmal thoracic aorta. Enlargement of the main pulmonary artery in relation to the ascending thoracic aorta. Trace pericardial effusion. Mediastinum/Nodes: Visualized thyroid is unremarkable. No axillary adenopathy. There are multiple mildly enlarged mediastinal lymph nodes. Representative pretracheal lymph node measures 13 mm in the short axis, previously 9 mm (series 2, image 46). Subcarinal lymph node measures 16 mm in the short axis, previously 13 mm (series 2, image  71). Preaortic lymph node measures 9 mm in the short axis, previously 7 mm (series 2, image 22). Lungs/Pleura: Trace LEFT pleural effusion. Evaluation of the pulmonary parenchyma is limited secondary to respiratory motion. Bronchial wall thickening. Scattered atelectasis. Favored background of mild interlobular septal thickening. LEFT upper lobe nodule versus scar measures 4 mm, obscured on prior exam (series 4, image 38). Upper Abdomen: Mildly lobular liver contours with separation of the fissures. Calcification of the gallbladder wall versus cholelithiasis. Small volume free fluid of the upper abdomen. Musculoskeletal: No acute osseous abnormality. IMPRESSION: 1. Cardiomegaly with trace LEFT pleural effusion, bronchial wall thickening and mild interlobular septal thickening. Findings are favored to reflect mild pulmonary edema. 2. Enlargement of the main pulmonary artery in relation to the ascending thoracic aorta, which can be seen in the setting of pulmonary arterial hypertension. 3. Mild mediastinal adenopathy, likely reactive. 4. Mildly  lobular liver contours with separation of the fissures. This is nonspecific but can be seen in the setting of cirrhosis. 5. Small volume free fluid of the upper abdomen. 6. Predominately LEFT-sided coronary artery atherosclerotic calcifications. 7. Incidental 4 mm LEFT upper lobe pulmonary nodule. No follow-up needed if patient is low-risk.This recommendation follows the consensus statement: Guidelines for Management of Incidental Pulmonary Nodules Detected on CT Images: From the Fleischner Society 2017; Radiology 2017; 284:228-243. Electronically Signed   By: Meda Klinefelter M.D.   On: 04/13/2023 12:51   DG Chest 2 View  Result Date: 04/13/2023 CLINICAL DATA:  Shortness of breath EXAM: CHEST - 2 VIEW COMPARISON:  CXR 12/17/22 FINDINGS: No pleural effusion. No pneumothorax. Cardiomegaly. Hazy opacity at the left lung base could represent atelectasis or infection. There  are prominent bilateral interstitial opacities could represent pulmonary venous congestion. No radiographically apparent displaced rib fractures. Visualized upper abdomen is unremarkable. Vertebral body heights are maintained. IMPRESSION: 1.  Cardiomegaly and pulmonary venous congestion. 2. There is a hazy opacity at the left lung base, which could represent atelectasis or infection Electronically Signed   By: Lorenza Cambridge M.D.   On: 04/13/2023 10:55      Assessment/Plan Principal Problem:   Acute on chronic systolic CHF (congestive heart failure) (HCC) Active Problems:   COPD (chronic obstructive pulmonary disease) (HCC)   Myocardial injury   Paroxysmal atrial fibrillation with RVR (HCC)   HTN (hypertension)   Hypokalemia   Cigarette smoker   Lung nodule   Obesity (BMI 30-39.9)   Assessment and Plan:  Acute on chronic systolic CHF (congestive heart failure) (HCC): 2D echo 12/18/2022 showed EF of 45-50%.  Patient has 2+ leg edema, positive JVD, fine crackles on auscultation, significantly elevated BNP 1178, clinically consistent with CHF exacerbation.  -Will admit to PCU as inpatient -Lasix 60 mg bid by IV -Daily weights -strict I/O's -Low salt diet -Fluid restriction -Obtain REDs Vest reading -As needed bronchodilators for shortness of breath  COPD (chronic obstructive pulmonary disease) [facility]): Per ED physician, patient had wheezing initially, but no wheezing on my examination, likely resolved with initial treatment.  Does not seem to have COPD exacerbation now. -Patient received 125 mg of Solu-Medrol in ED, will not continue steroid -Bronchodilators, as needed Mucinex  Myocardial injury: trop 37 --> 37. No CP -check A1c and FLp -will give ASA since pt is on Eliquis  Paroxysmal atrial fibrillation with RVR (HCC): HR is up to 120-140 -Eliquis -metoprolol, Cardizem - start Amiodarone gtt ( bp is soft, can not use cardizeme gtt)  HTN (hypertension) -IV hydralazine as  needed -Metoprolol, Cardizem -hold [place] due to soft Bp -Patient is on IV Lasix  Hypokalemia: Potassium 3.3, magnesium 2.3 -Repleted potassium  Cigarette smoker -Did counseling about importance of quitting smoking -nicotine patch  Lung nodule: This is incidental finding by CT scan -Follow-up with PCP  Obesity (HCC): Body weight 117.1 kg, BMI 34.06 -Encouraged to losing weight -Exercise and healthy diet       DVT ppx: on Eliquis  Code Status: Full code  Family Communication: Yes, patient's girlfriend by phone  Disposition Plan:  Anticipate discharge back to previous environment  Consults called:  none  Admission status and Level of care: Telemetry Cardiac:  as inpt     Dispo: The patient is from: Home              Anticipated d/c is to: Home              Anticipated  d/c date is: 2 days              Patient currently is not medically stable to d/c.    Severity of Illness:  The appropriate patient status for this patient is INPATIENT. Inpatient status is judged to be reasonable and necessary in order to provide the required intensity of service to ensure the patient's safety. The patient's presenting symptoms, physical exam findings, and initial radiographic and laboratory data in the context of their chronic comorbidities is felt to place them at high risk for further clinical deterioration. Furthermore, it is not anticipated that the patient will be medically stable for discharge from the hospital within 2 midnights of admission.   * I certify that at the point of admission it is my clinical judgment that the patient will require inpatient hospital care spanning beyond 2 midnights from the point of admission due to high intensity of service, high risk for further deterioration and high frequency of surveillance required.*       Date of Service 04/13/2023    Lorretta Harp [facility]   If 7PM-7AM, please contact night-coverage [email or web address] 04/13/2023,  4:24 PM

## 2023-04-13 NOTE — ED Notes (Signed)
Pt given snack and drink as requested. 

## 2023-04-14 DIAGNOSIS — I5023 Acute on chronic systolic (congestive) heart failure: Secondary | ICD-10-CM | POA: Diagnosis not present

## 2023-04-14 LAB — BASIC METABOLIC PANEL
Anion gap: 9 (ref 5–15)
BUN: 18 mg/dL (ref 6–20)
CO2: 39 mmol/L — ABNORMAL HIGH (ref 22–32)
Calcium: 8.2 mg/dL — ABNORMAL LOW (ref 8.9–10.3)
Chloride: 95 mmol/L — ABNORMAL LOW (ref 98–111)
Creatinine, Ser: 1.24 mg/dL (ref 0.61–1.24)
GFR, Estimated: >60 mL/min (ref >60)
Glucose, Bld: 131 mg/dL — ABNORMAL HIGH (ref 70–99)
Potassium: 3.9 mmol/L (ref 3.5–5.1)
Sodium: 143 mmol/L (ref 135–145)

## 2023-04-14 LAB — HIV ANTIBODY (ROUTINE TESTING W REFLEX): HIV Screen 4th Generation wRfx: NONREACTIVE

## 2023-04-14 LAB — LIPID PANEL
Cholesterol: 150 mg/dL (ref 0–200)
HDL: 57 mg/dL (ref >40)
LDL Cholesterol: 85 mg/dL (ref 0–99)
Total CHOL/HDL Ratio: 2.6 RATIO
Triglycerides: 41 mg/dL (ref <150)
VLDL: 8 mg/dL (ref 0–40)

## 2023-04-14 LAB — MAGNESIUM: Magnesium: 2.2 mg/dL (ref 1.7–2.4)

## 2023-04-14 MED ORDER — FUROSEMIDE 10 MG/ML IJ SOLN: 40.0000 mg | Freq: Two times a day (BID) | INTRAMUSCULAR | Status: DC

## 2023-04-14 MED ORDER — FUROSEMIDE 10 MG/ML IJ SOLN
40.0000 mg | Freq: Two times a day (BID) | INTRAMUSCULAR | Status: AC
  Administered 2023-04-14 – 2023-04-16 (×6): 40 mg via INTRAVENOUS
  Filled 2023-04-14 (×6): qty 4

## 2023-04-14 NOTE — Discharge Instructions (Signed)

## 2023-04-14 NOTE — Progress Notes (Signed)
PROGRESS NOTE    Kenneth Benson  ZOX:[id number] DOB: 30-Nov-1960 DOA: 04/13/2023 PCP: Pcp, No    Brief Narrative:  62 y.o. male with medical history significant of CHF with EF of 45-50%, hypertension, COPD/asthma, atrial fibrillation on Eliquis, obesity, tobacco abuse, who presents with shortness of breath.   Patient states that he has shortness of breath for more than 4 days, which has been progressively worsening.  He has dry cough, no chest pain, fever or chills.  He has worsening bilateral leg edema.  He has loose stool, no nausea, vomiting or abdominal pain.  No active diarrhea.  Denies symptoms of UTI.  Patient is not using oxygen normally, found to have oxygen saturation 90% on room air, which improved to 93% on 2 L oxygen.   Assessment & Plan:   Principal Problem:   Acute on chronic systolic CHF (congestive heart failure) (HCC) Active Problems:   COPD (chronic obstructive pulmonary disease) (HCC)   Myocardial injury   Paroxysmal atrial fibrillation with RVR (HCC)   HTN (hypertension)   Hypokalemia   Cigarette smoker   Lung nodule   Obesity (BMI 30-39.9)  Acute on chronic systolic CHF (congestive heart failure) (HCC):  2D echo 12/18/2022 showed EF of 45-50%.  Patient has 2+ leg edema, positive JVD, fine crackles on auscultation, significantly elevated BNP 1178, clinically consistent with CHF exacerbation. Plan: Continue Lasix 40 mg IV twice daily Titrate dose to achieve net -1.5 to 2 L daily Daily weights, strict I's and O's, low-sodium diet, fluid restrict  COPD (chronic obstructive pulmonary disease) (HCC) Per ED physician, patient had wheezing initially, but no wheezing on my examination, likely resolved with initial treatment.  Does not seem to have COPD exacerbation now. -Patient received 125 mg of Solu-Medrol in ED, will not continue steroid -Bronchodilators, as needed Mucinex   Myocardial injury: trop 37 --> 37. No CP Suspect supply/demand ischemia in the setting  of acute illness   Paroxysmal atrial fibrillation with RVR (HCC): HR is up to 120-140 -Eliquis -metoprolol, Cardizem -Continue amiodarone gtt.  Wean as tolerated   HTN (hypertension) -IV hydralazine as needed -Metoprolol, Cardizem -hold Cozarr due to soft Bp -Patient is on IV Lasix   Hypokalemia: Potassium 3.3, magnesium 2.3 -Repleted potassium   Cigarette smoker -Did counseling about importance of quitting smoking -nicotine patch   Lung nodule: This is incidental finding by CT scan -Follow-up with PCP   Obesity (HCC): Body weight 117.1 kg, BMI 34.06 -Encouraged to losing weight -Exercise and healthy diet   DVT prophylaxis: Eliquis Code Status: Full Family Communication: None Disposition Plan: Status is: Inpatient Remains inpatient appropriate because: Decompensated CHF on IV diuresis.  Rapid atrial fibrillation on amiodarone gtt.   Level of care: Progressive  Consultants:  None  Procedures:  None  Antimicrobials: None   Subjective: Seen and examined.  Reports symptomatic improvement since admission.  No pain complaints.  Objective: Vitals:   04/14/23 0447 04/14/23 0715 04/14/23 0735 04/14/23 1032  BP:   (!) 89/66 90/67  Pulse:   (!) 104 98  Resp:   18 19  Temp:   (!) 96.4 F (35.8 C) (!) 97.4 F (36.3 C)  TempSrc:      SpO2: 92% 93% (!) 85% 93%  Weight:      Height:        Intake/Output Summary (Last 24 hours) at 04/14/2023 1129 Last data filed at 04/14/2023 0430 Gross per 24 hour  Intake 804.79 ml  Output 2450 ml  Net -1645.21 ml  Filed Weights   04/13/23 2020  Weight: 120 kg    Examination:  General exam: NAD Respiratory system: Lung sounds diminished.  Scattered crackles.  Normal work of breathing.  3 L Cardiovascular system: S1-S2, tachycardic, irregular rhythm, no murmurs, trace pedal edema Gastrointestinal system: Soft, NT/ND, normal bowel sounds Central nervous system: Alert and oriented. No focal neurological  deficits. Extremities: Symmetric 5 x 5 power. Skin: No rashes, lesions or ulcers Psychiatry: Judgement and insight appear normal. Mood & affect appropriate.     Data Reviewed: I have personally reviewed following labs and imaging studies  CBC: Recent Labs  Lab 04/13/23 1037  WBC 5.3  HGB 15.1  HCT 49.6  MCV 96.7  PLT 215   Basic Metabolic Panel: Recent Labs  Lab 04/13/23 1037 04/13/23 1412 04/14/23 0451  NA 141  --  143  K 3.3*  --  3.9  CL 96*  --  95*  CO2 34*  --  39*  GLUCOSE 121*  --  131*  BUN 13  --  18  CREATININE 0.98  --  1.24  CALCIUM 8.6*  --  8.2*  MG  --  2.3 2.2   GFR: Estimated Creatinine Clearance: 84.8 mL/min (by C-G formula based on SCr of 1.24 mg/dL). Liver Function Tests: No results for input(s): "AST", "ALT", "ALKPHOS", "BILITOT", "PROT", "ALBUMIN" in the last 168 hours. No results for input(s): "LIPASE", "AMYLASE" in the last 168 hours. No results for input(s): "AMMONIA" in the last 168 hours. Coagulation Profile: No results for input(s): "INR", "PROTIME" in the last 168 hours. Cardiac Enzymes: No results for input(s): "CKTOTAL", "CKMB", "CKMBINDEX", "TROPONINI" in the last 168 hours. BNP (last 3 results) No results for input(s): "PROBNP" in the last 8760 hours. HbA1C: Recent Labs    04/13/23 1412  HGBA1C 6.0*   CBG: No results for input(s): "GLUCAP" in the last 168 hours. Lipid Profile: Recent Labs    04/14/23 0451  CHOL 150  HDL 57  LDLCALC 85  TRIG 41  CHOLHDL 2.6   Thyroid Function Tests: No results for input(s): "TSH", "T4TOTAL", "FREET4", "T3FREE", "THYROIDAB" in the last 72 hours. Anemia Panel: No results for input(s): "VITAMINB12", "FOLATE", "FERRITIN", "TIBC", "IRON", "RETICCTPCT" in the last 72 hours. Sepsis Labs: No results for input(s): "PROCALCITON", "LATICACIDVEN" in the last 168 hours.  Recent Results (from the past 240 hour(s))  SARS Coronavirus 2 by RT PCR (hospital order, performed in [facility]  hospital lab) *cepheid single result test* Anterior Nasal Swab     Status: None   Collection Time: 04/13/23  2:12 PM   Specimen: Anterior Nasal Swab  Result Value Ref Range Status   SARS Coronavirus 2 by RT PCR NEGATIVE NEGATIVE Final    Comment: (NOTE) SARS-CoV-2 target nucleic acids are NOT DETECTED.  The SARS-CoV-2 RNA is generally detectable in upper and lower respiratory specimens during the acute phase of infection. The lowest concentration of SARS-CoV-2 viral copies this assay can detect is 250 copies / mL. A negative result does not preclude SARS-CoV-2 infection and should not be used as the sole basis for treatment or other patient management decisions.  A negative result may occur with improper specimen collection / handling, submission of specimen other than nasopharyngeal swab, presence of viral mutation(s) within the areas targeted by this assay, and inadequate number of viral copies (<250 copies / mL). A negative result must be combined with clinical observations, patient history, and epidemiological information.  Fact Sheet for Patients:   RoadLapTop.co.za  Fact Sheet  for Healthcare Providers: http://kim-miller.com/  This test is not yet approved or  cleared by the [place] and has been authorized for detection and/or diagnosis of SARS-CoV-2 by FDA under an Emergency Use Authorization (EUA).  This EUA will remain in effect (meaning this test can be used) for the duration of the COVID-19 declaration under Section 564(b)(1) of the Act, 21 U.S.C. section 360bbb-3(b)(1), unless the authorization is terminated or revoked sooner.  Performed at [facility], [address] [place], [place] [address]          Radiology Studies: CT CHEST WO CONTRAST  Result Date: 04/13/2023 CLINICAL DATA:  Respiratory illness, nondiagnostic xray EXAM: CT CHEST WITHOUT CONTRAST TECHNIQUE: Multidetector CT imaging of the  chest was performed following the standard protocol without IV contrast. RADIATION DOSE REDUCTION: This exam was performed according to the departmental dose-optimization program which includes automated exposure control, adjustment of the mA and/or kV according to patient size and/or use of iterative reconstruction technique. COMPARISON:  January 30, 2022 FINDINGS: Evaluation is limited by respiratory motion. Cardiovascular: Cardiomegaly. Predominately LEFT-sided coronary artery atherosclerotic calcifications. Nonaneurysmal thoracic aorta. Enlargement of the main pulmonary artery in relation to the ascending thoracic aorta. Trace pericardial effusion. Mediastinum/Nodes: Visualized thyroid is unremarkable. No axillary adenopathy. There are multiple mildly enlarged mediastinal lymph nodes. Representative pretracheal lymph node measures 13 mm in the short axis, previously 9 mm (series 2, image 46). Subcarinal lymph node measures 16 mm in the short axis, previously 13 mm (series 2, image 71). Preaortic lymph node measures 9 mm in the short axis, previously 7 mm (series 2, image 22). Lungs/Pleura: Trace LEFT pleural effusion. Evaluation of the pulmonary parenchyma is limited secondary to respiratory motion. Bronchial wall thickening. Scattered atelectasis. Favored background of mild interlobular septal thickening. LEFT upper lobe nodule versus scar measures 4 mm, obscured on prior exam (series 4, image 38). Upper Abdomen: Mildly lobular liver contours with separation of the fissures. Calcification of the gallbladder wall versus cholelithiasis. Small volume free fluid of the upper abdomen. Musculoskeletal: No acute osseous abnormality. IMPRESSION: 1. Cardiomegaly with trace LEFT pleural effusion, bronchial wall thickening and mild interlobular septal thickening. Findings are favored to reflect mild pulmonary edema. 2. Enlargement of the main pulmonary artery in relation to the ascending thoracic aorta, which can be seen in  the setting of pulmonary arterial hypertension. 3. Mild mediastinal adenopathy, likely reactive. 4. Mildly lobular liver contours with separation of the fissures. This is nonspecific but can be seen in the setting of cirrhosis. 5. Small volume free fluid of the upper abdomen. 6. Predominately LEFT-sided coronary artery atherosclerotic calcifications. 7. Incidental 4 mm LEFT upper lobe pulmonary nodule. No follow-up needed if patient is low-risk.This recommendation follows the consensus statement: Guidelines for Management of Incidental Pulmonary Nodules Detected on CT Images: From the Fleischner Society 2017; Radiology 2017; 284:228-243. Electronically Signed   By: Meda Klinefelter M.D.   On: 04/13/2023 12:51   DG Chest 2 View  Result Date: 04/13/2023 CLINICAL DATA:  Shortness of breath EXAM: CHEST - 2 VIEW COMPARISON:  CXR 12/17/22 FINDINGS: No pleural effusion. No pneumothorax. Cardiomegaly. Hazy opacity at the left lung base could represent atelectasis or infection. There are prominent bilateral interstitial opacities could represent pulmonary venous congestion. No radiographically apparent displaced rib fractures. Visualized upper abdomen is unremarkable. Vertebral body heights are maintained. IMPRESSION: 1.  Cardiomegaly and pulmonary venous congestion. 2. There is a hazy opacity at the left lung base, which could represent atelectasis or infection Electronically Signed  By: Lorenza Cambridge M.D.   On: 04/13/2023 10:55        Scheduled Meds:  apixaban  5 mg Oral BID   diltiazem  360 mg Oral Daily   empagliflozin  10 mg Oral Daily   furosemide  40 mg Intravenous Q12H   ipratropium-albuterol  3 mL Nebulization Q6H   metoprolol tartrate  50 mg Oral BID   nicotine  21 mg Transdermal Daily   Continuous Infusions:  amiodarone 30 mg/hr (04/14/23 0913)     LOS: 1 day     Tresa Moore, MD [facility]   If 7PM-7AM, please contact night-coverage  04/14/2023, 11:29 AM

## 2023-04-15 DIAGNOSIS — I5023 Acute on chronic systolic (congestive) heart failure: Secondary | ICD-10-CM | POA: Diagnosis not present

## 2023-04-15 LAB — BASIC METABOLIC PANEL
Anion gap: 10 (ref 5–15)
BUN: 23 mg/dL — ABNORMAL HIGH (ref 6–20)
CO2: 39 mmol/L — ABNORMAL HIGH (ref 22–32)
Calcium: 8.1 mg/dL — ABNORMAL LOW (ref 8.9–10.3)
Chloride: 92 mmol/L — ABNORMAL LOW (ref 98–111)
Creatinine, Ser: 1.16 mg/dL (ref 0.61–1.24)
GFR, Estimated: >60 mL/min (ref >60)
Glucose, Bld: 116 mg/dL — ABNORMAL HIGH (ref 70–99)
Potassium: 3.6 mmol/L (ref 3.5–5.1)
Sodium: 141 mmol/L (ref 135–145)

## 2023-04-15 MED ORDER — AMIODARONE HCL 200 MG PO TABS
400.0000 mg | ORAL_TABLET | Freq: Two times a day (BID) | ORAL | Status: DC
  Administered 2023-04-15 – 2023-04-16 (×3): 400 mg via ORAL
  Filled 2023-04-15 (×3): qty 2

## 2023-04-15 MED ORDER — METOPROLOL TARTRATE 5 MG/5ML IV SOLN
5.0000 mg | INTRAVENOUS | Status: DC | PRN
  Administered 2023-04-15: 5 mg via INTRAVENOUS
  Filled 2023-04-15: qty 5

## 2023-04-15 NOTE — Progress Notes (Signed)
PROGRESS NOTE    WILKENS PANCOAST  UEA:[id number] DOB: [birth year] DOA: 04/13/2023 PCP: Pcp, No    Brief Narrative:  62 y.o. male with medical history significant of CHF with EF of 45-50%, hypertension, COPD/asthma, atrial fibrillation on Eliquis, obesity, tobacco abuse, who presents with shortness of breath.   Patient states that he has shortness of breath for more than 4 days, which has been progressively worsening.  He has dry cough, no chest pain, fever or chills.  He has worsening bilateral leg edema.  He has loose stool, no nausea, vomiting or abdominal pain.  No active diarrhea.  Denies symptoms of UTI.  Patient is not using oxygen normally, found to have oxygen saturation 90% on room air, which improved to 93% on 2 L oxygen.   Assessment & Plan:   Principal Problem:   Acute on chronic systolic CHF (congestive heart failure) (HCC) Active Problems:   COPD (chronic obstructive pulmonary disease) (HCC)   Myocardial injury   Paroxysmal atrial fibrillation with RVR (HCC)   HTN (hypertension)   Hypokalemia   Cigarette smoker   Lung nodule   Obesity (BMI 30-39.9)  Acute on chronic systolic CHF (congestive heart failure) (HCC):  2D echo 12/18/2022 showed EF of 45-50%.  Patient has 2+ leg edema, positive JVD, fine crackles on auscultation, significantly elevated BNP 1178, clinically consistent with CHF exacerbation. Plan: Continue Lasix 40 mg IV twice daily Titrate dose to achieve net -1.5 to 2 L daily Daily weights, strict I's and O's, low-sodium diet, fluid restrict  COPD (chronic obstructive pulmonary disease) (HCC) Per ED physician, patient had wheezing initially, but no wheezing on my examination, likely resolved with initial treatment.  Does not seem to have COPD exacerbation now. -Patient received 125 mg of Solu-Medrol in ED, will not continue steroid -Bronchodilators, as needed Mucinex   Myocardial injury: trop 37 --> 37. No CP Suspect supply/demand ischemia in the setting  of acute illness Telemetry monitoring   Paroxysmal atrial fibrillation with RVR (HCC):  -Eliquis -metoprolol, Cardizem -Will attempt to discontinue amiodarone gtt. today.  Start p.o. amiodarone 400 mg twice daily   HTN (hypertension) -IV hydralazine as needed -Metoprolol, Cardizem -hold Cozarr due to soft Bp -Patient is on IV Lasix   Hypokalemia: Potassium 3.3, magnesium 2.3 -Repleted potassium   Cigarette smoker -Did counseling about importance of quitting smoking -nicotine patch   Lung nodule: This is incidental finding by CT scan -Follow-up with PCP   Obesity (HCC): Body weight 117.1 kg, BMI 34.06 -Encouraged to losing weight -Exercise and healthy diet   DVT prophylaxis: Eliquis Code Status: Full Family Communication: None Disposition Plan: Status is: Inpatient Remains inpatient appropriate because: Decompensated CHF on IV diuresis.  Rapid atrial fibrillation on amiodarone gtt.   Level of care: Progressive  Consultants:  None  Procedures:  None  Antimicrobials: None   Subjective: Seen and examined.  Feeling better today.  Remains on 2 L.  Objective: Vitals:   04/15/23 0719 04/15/23 0724 04/15/23 0750 04/15/23 1155  BP:  108/79  100/85  Pulse:  82  98  Resp:  18  18  Temp:  (!) 97.2 F (36.2 C)  98.3 F (36.8 C)  TempSrc:    Oral  SpO2: (!) 89% 98% 91% 94%  Weight:      Height:        Intake/Output Summary (Last 24 hours) at 04/15/2023 1216 Last data filed at 04/15/2023 0726 Gross per 24 hour  Intake 480 ml  Output 2300 ml  Net -  1820 ml   Filed Weights   04/13/23 2020 04/15/23 0702  Weight: 120 kg 122.2 kg    Examination:  General exam: No acute distress Respiratory system: Bibasilar crackles.  Normal work of breathing.  2 L Cardiovascular system: S1-S2, tachycardic, irregular rhythm, no murmurs, trace pedal edema Gastrointestinal system: Soft, NT/ND, normal bowel sounds Central nervous system: Alert and oriented. No focal  neurological deficits. Extremities: Symmetric 5 x 5 power. Skin: No rashes, lesions or ulcers Psychiatry: Judgement and insight appear normal. Mood & affect appropriate.     Data Reviewed: I have personally reviewed following labs and imaging studies  CBC: Recent Labs  Lab 04/13/23 1037  WBC 5.3  HGB 15.1  HCT 49.6  MCV 96.7  PLT 215   Basic Metabolic Panel: Recent Labs  Lab 04/13/23 1037 04/13/23 1412 04/14/23 0451 04/15/23 0831  NA 141  --  143 141  K 3.3*  --  3.9 3.6  CL 96*  --  95* 92*  CO2 34*  --  39* 39*  GLUCOSE 121*  --  131* 116*  BUN 13  --  18 23*  CREATININE 0.98  --  1.24 1.16  CALCIUM 8.6*  --  8.2* 8.1*  MG  --  2.3 2.2  --    GFR: Estimated Creatinine Clearance: 91.4 mL/min (by C-G formula based on SCr of 1.16 mg/dL). Liver Function Tests: No results for input(s): "AST", "ALT", "ALKPHOS", "BILITOT", "PROT", "ALBUMIN" in the last 168 hours. No results for input(s): "LIPASE", "AMYLASE" in the last 168 hours. No results for input(s): "AMMONIA" in the last 168 hours. Coagulation Profile: No results for input(s): "INR", "PROTIME" in the last 168 hours. Cardiac Enzymes: No results for input(s): "CKTOTAL", "CKMB", "CKMBINDEX", "TROPONINI" in the last 168 hours. BNP (last 3 results) No results for input(s): "PROBNP" in the last 8760 hours. HbA1C: Recent Labs    04/13/23 1412  HGBA1C 6.0*   CBG: No results for input(s): "GLUCAP" in the last 168 hours. Lipid Profile: Recent Labs    04/14/23 0451  CHOL 150  HDL 57  LDLCALC 85  TRIG 41  CHOLHDL 2.6   Thyroid Function Tests: No results for input(s): "TSH", "T4TOTAL", "FREET4", "T3FREE", "THYROIDAB" in the last 72 hours. Anemia Panel: No results for input(s): "VITAMINB12", "FOLATE", "FERRITIN", "TIBC", "IRON", "RETICCTPCT" in the last 72 hours. Sepsis Labs: No results for input(s): "PROCALCITON", "LATICACIDVEN" in the last 168 hours.  Recent Results (from the past 240 hour(s))  SARS  Coronavirus 2 by RT PCR (hospital order, performed in [facility] hospital lab) *cepheid single result test* Anterior Nasal Swab     Status: None   Collection Time: 04/13/23  2:12 PM   Specimen: Anterior Nasal Swab  Result Value Ref Range Status   SARS Coronavirus 2 by RT PCR NEGATIVE NEGATIVE Final    Comment: (NOTE) SARS-CoV-2 target nucleic acids are NOT DETECTED.  The SARS-CoV-2 RNA is generally detectable in upper and lower respiratory specimens during the acute phase of infection. The lowest concentration of SARS-CoV-2 viral copies this assay can detect is 250 copies / mL. A negative result does not preclude SARS-CoV-2 infection and should not be used as the sole basis for treatment or other patient management decisions.  A negative result may occur with improper specimen collection / handling, submission of specimen other than nasopharyngeal swab, presence of viral mutation(s) within the areas targeted by this assay, and inadequate number of viral copies (<250 copies / mL). A negative result must be combined  with clinical observations, patient history, and epidemiological information.  Fact Sheet for Patients:   RoadLapTop.co.za  Fact Sheet for Healthcare Providers: http://kim-miller.com/  This test is not yet approved or  cleared by the [place] FDA and has been authorized for detection and/or diagnosis of SARS-CoV-2 by FDA under an Emergency Use Authorization (EUA).  This EUA will remain in effect (meaning this test can be used) for the duration of the COVID-19 declaration under Section 564(b)(1) of the Act, 21 U.S.C. section 360bbb-3(b)(1), unless the authorization is terminated or revoked sooner.  Performed at [facility], [address] [place], [place] [address]          Radiology Studies: CT CHEST WO CONTRAST  Result Date: 04/13/2023 CLINICAL DATA:  Respiratory illness, nondiagnostic xray EXAM: CT  CHEST WITHOUT CONTRAST TECHNIQUE: Multidetector CT imaging of the chest was performed following the standard protocol without IV contrast. RADIATION DOSE REDUCTION: This exam was performed according to the departmental dose-optimization program which includes automated exposure control, adjustment of the mA and/or kV according to patient size and/or use of iterative reconstruction technique. COMPARISON:  January 30, 2022 FINDINGS: Evaluation is limited by respiratory motion. Cardiovascular: Cardiomegaly. Predominately LEFT-sided coronary artery atherosclerotic calcifications. Nonaneurysmal thoracic aorta. Enlargement of the main pulmonary artery in relation to the ascending thoracic aorta. Trace pericardial effusion. Mediastinum/Nodes: Visualized thyroid is unremarkable. No axillary adenopathy. There are multiple mildly enlarged mediastinal lymph nodes. Representative pretracheal lymph node measures 13 mm in the short axis, previously 9 mm (series 2, image 46). Subcarinal lymph node measures 16 mm in the short axis, previously 13 mm (series 2, image 71). Preaortic lymph node measures 9 mm in the short axis, previously 7 mm (series 2, image 22). Lungs/Pleura: Trace LEFT pleural effusion. Evaluation of the pulmonary parenchyma is limited secondary to respiratory motion. Bronchial wall thickening. Scattered atelectasis. Favored background of mild interlobular septal thickening. LEFT upper lobe nodule versus scar measures 4 mm, obscured on prior exam (series 4, image 38). Upper Abdomen: Mildly lobular liver contours with separation of the fissures. Calcification of the gallbladder wall versus cholelithiasis. Small volume free fluid of the upper abdomen. Musculoskeletal: No acute osseous abnormality. IMPRESSION: 1. Cardiomegaly with trace LEFT pleural effusion, bronchial wall thickening and mild interlobular septal thickening. Findings are favored to reflect mild pulmonary edema. 2. Enlargement of the main pulmonary artery  in relation to the ascending thoracic aorta, which can be seen in the setting of pulmonary arterial hypertension. 3. Mild mediastinal adenopathy, likely reactive. 4. Mildly lobular liver contours with separation of the fissures. This is nonspecific but can be seen in the setting of cirrhosis. 5. Small volume free fluid of the upper abdomen. 6. Predominately LEFT-sided coronary artery atherosclerotic calcifications. 7. Incidental 4 mm LEFT upper lobe pulmonary nodule. No follow-up needed if patient is low-risk.This recommendation follows the consensus statement: Guidelines for Management of Incidental Pulmonary Nodules Detected on CT Images: From the Fleischner Society 2017; Radiology 2017; 284:228-243. Electronically Signed   By: Meda Klinefelter M.D.   On: 04/13/2023 12:51        Scheduled Meds:  amiodarone  400 mg Oral BID   apixaban  5 mg Oral BID   diltiazem  360 mg Oral Daily   empagliflozin  10 mg Oral Daily   furosemide  40 mg Intravenous Q12H   ipratropium-albuterol  3 mL Nebulization Q6H   metoprolol tartrate  50 mg Oral BID   nicotine  21 mg Transdermal Daily   Continuous Infusions:  amiodarone Stopped (  04/15/23 1139)     LOS: 2 days     Tresa Moore, MD [facility]   If 7PM-7AM, please contact night-coverage  04/15/2023, 12:16 PM

## 2023-04-16 ENCOUNTER — Inpatient Hospital Stay: Payer: Medicaid Other

## 2023-04-16 DIAGNOSIS — I5023 Acute on chronic systolic (congestive) heart failure: Secondary | ICD-10-CM | POA: Diagnosis not present

## 2023-04-16 LAB — BASIC METABOLIC PANEL
Anion gap: 10 (ref 5–15)
BUN: 26 mg/dL — ABNORMAL HIGH (ref 6–20)
CO2: 38 mmol/L — ABNORMAL HIGH (ref 22–32)
Calcium: 7.9 mg/dL — ABNORMAL LOW (ref 8.9–10.3)
Chloride: 90 mmol/L — ABNORMAL LOW (ref 98–111)
Creatinine, Ser: 1.17 mg/dL (ref 0.61–1.24)
GFR, Estimated: >60 mL/min (ref >60)
Glucose, Bld: 88 mg/dL (ref 70–99)
Potassium: 3.9 mmol/L (ref 3.5–5.1)
Sodium: 138 mmol/L (ref 135–145)

## 2023-04-16 MED ORDER — BUDESONIDE 0.25 MG/2ML IN SUSP
0.2500 mg | Freq: Two times a day (BID) | RESPIRATORY_TRACT | Status: DC
  Administered 2023-04-16 – 2023-04-17 (×2): 0.25 mg via RESPIRATORY_TRACT
  Filled 2023-04-16 (×2): qty 2

## 2023-04-16 MED ORDER — FUROSEMIDE 40 MG PO TABS
40.0000 mg | ORAL_TABLET | Freq: Every day | ORAL | Status: DC
  Administered 2023-04-17: 40 mg via ORAL
  Filled 2023-04-16: qty 1

## 2023-04-16 MED ORDER — ARFORMOTEROL TARTRATE 15 MCG/2ML IN NEBU
15.0000 ug | INHALATION_SOLUTION | Freq: Two times a day (BID) | RESPIRATORY_TRACT | Status: DC
  Administered 2023-04-16 – 2023-04-17 (×2): 15 ug via RESPIRATORY_TRACT
  Filled 2023-04-16 (×3): qty 2

## 2023-04-16 MED ORDER — METHYLPREDNISOLONE SODIUM SUCC 125 MG IJ SOLR
60.0000 mg | Freq: Every day | INTRAMUSCULAR | Status: DC
  Administered 2023-04-16 – 2023-04-17 (×2): 60 mg via INTRAVENOUS
  Filled 2023-04-16 (×2): qty 2

## 2023-04-16 MED ORDER — IPRATROPIUM-ALBUTEROL 0.5-2.5 (3) MG/3ML IN SOLN
3.0000 mL | RESPIRATORY_TRACT | Status: DC
  Administered 2023-04-16 – 2023-04-17 (×5): 3 mL via RESPIRATORY_TRACT
  Filled 2023-04-16 (×6): qty 3

## 2023-04-16 NOTE — Progress Notes (Signed)
PROGRESS NOTE    Kenneth Benson  NFA:[id number] DOB: March 25, 1961 DOA: 04/13/2023 PCP: Pcp, No    Brief Narrative:  62 y.o. male with medical history significant of CHF with EF of 45-50%, hypertension, COPD/asthma, atrial fibrillation on Eliquis, obesity, tobacco abuse, who presents with shortness of breath.  Patient states that he has shortness of breath for more than 4 days, which has been progressively worsening.  He has dry cough, no chest pain, fever or chills.  He has worsening bilateral leg edema.  He has loose stool, no nausea, vomiting or abdominal pain.  No active diarrhea.  Denies symptoms of UTI.  Patient is not using oxygen normally, found to have oxygen saturation 90% on room air, which improved to 93% on 2 L oxygen.  5/19: Patient continues to make good urine.  5 L net negative.  Remains short of breath and coughing.  Suspect worsening COPD.   Assessment & Plan:   Principal Problem:   Acute on chronic systolic CHF (congestive heart failure) (HCC) Active Problems:   COPD (chronic obstructive pulmonary disease) (HCC)   Myocardial injury   Paroxysmal atrial fibrillation with RVR (HCC)   HTN (hypertension)   Hypokalemia   Cigarette smoker   Lung nodule   Obesity (BMI 30-39.9)  Acute on chronic systolic CHF (congestive heart failure) (HCC):  2D echo 12/18/2022 showed EF of 45-50%.  Patient has 2+ leg edema, positive JVD, fine crackles on auscultation, significantly elevated BNP 1178, clinically consistent with CHF exacerbation. Plan: Continue Lasix 40 mg IV twice daily for today Start p.o. Lasix 40 mg daily from 5/20 Titrate dose to achieve net -1.5 to 2 L daily Daily weights, strict I's and O's, low-sodium diet, fluid restrict  Acute exacerbation of COPD Initially noted to have wheezing in ED.  That had resolved.  Now recurred on 5/19.  Will treat as decompensated COPD. Plan: Solu-Medrol 60 mg IV daily Every 4 DuoNeb Twice daily Brovana Twice daily Pulmicort Wean  oxygen as tolerated  Myocardial injury: trop 37 --> 37. No CP Suspect supply/demand ischemia in the setting of acute illness Telemetry monitoring   Paroxysmal atrial fibrillation with RVR (HCC):  Rate control improved Plan: -Eliquis -metoprolol, Cardizem -Amiodarone discontinued   HTN (hypertension) -IV hydralazine as needed -Metoprolol, Cardizem -hold Cozarr due to soft Bp -Patient is on IV Lasix   Hypokalemia: Potassium 3.3, magnesium 2.3 -Repleted potassium   Cigarette smoker -Did counseling about importance of quitting smoking -nicotine patch   Lung nodule: This is incidental finding by CT scan -Follow-up with PCP   Obesity (HCC): Body weight 117.1 kg, BMI 34.06 -Encouraged to losing weight -Exercise and healthy diet   DVT prophylaxis: Eliquis Code Status: Full Family Communication: None Disposition Plan: Status is: Inpatient Remains inpatient appropriate because: Decompensated CHF on IV diuresis.  Decompensated COPD on IV steroids   Level of care: Progressive  Consultants:  None  Procedures:  None  Antimicrobials: None   Subjective: Seen and sent.  Coughing and short of breath today.  Wheezing.  Objective: Vitals:   04/16/23 1048 04/16/23 1140 04/16/23 1141 04/16/23 1225  BP:  100/74    Pulse:  94    Resp: 20 18 19    Temp:  98.3 F (36.8 C)    TempSrc:  Oral    SpO2: 95% 93%  93%  Weight:      Height:        Intake/Output Summary (Last 24 hours) at 04/16/2023 1414 Last data filed at 04/16/2023 1228 Gross  per 24 hour  Intake 1280 ml  Output 4725 ml  Net -3445 ml   Filed Weights   04/13/23 2020 04/15/23 0702  Weight: 120 kg 122.2 kg    Examination:  General exam: No acute distress Respiratory system: Scattered end expiratory wheeze.  Bibasilar crackles.  Normal work of breathing.  2 L Cardiovascular system: S1-S2, regular rate, irregular rhythm, no murmurs, trace pedal edema Gastrointestinal system: Soft, NT/ND, normal bowel  sounds Central nervous system: Alert and oriented. No focal neurological deficits. Extremities: Symmetric 5 x 5 power. Skin: No rashes, lesions or ulcers Psychiatry: Judgement and insight appear normal. Mood & affect appropriate.     Data Reviewed: I have personally reviewed following labs and imaging studies  CBC: Recent Labs  Lab 04/13/23 1037  WBC 5.3  HGB 15.1  HCT 49.6  MCV 96.7  PLT 215   Basic Metabolic Panel: Recent Labs  Lab 04/13/23 1037 04/13/23 1412 04/14/23 0451 04/15/23 0831 04/16/23 0414  NA 141  --  143 141 138  K 3.3*  --  3.9 3.6 3.9  CL 96*  --  95* 92* 90*  CO2 34*  --  39* 39* 38*  GLUCOSE 121*  --  131* 116* 88  BUN 13  --  18 23* 26*  CREATININE 0.98  --  1.24 1.16 1.17  CALCIUM 8.6*  --  8.2* 8.1* 7.9*  MG  --  2.3 2.2  --   --    GFR: Estimated Creatinine Clearance: 90.6 mL/min (by C-G formula based on SCr of 1.17 mg/dL). Liver Function Tests: No results for input(s): "AST", "ALT", "ALKPHOS", "BILITOT", "PROT", "ALBUMIN" in the last 168 hours. No results for input(s): "LIPASE", "AMYLASE" in the last 168 hours. No results for input(s): "AMMONIA" in the last 168 hours. Coagulation Profile: No results for input(s): "INR", "PROTIME" in the last 168 hours. Cardiac Enzymes: No results for input(s): "CKTOTAL", "CKMB", "CKMBINDEX", "TROPONINI" in the last 168 hours. BNP (last 3 results) No results for input(s): "PROBNP" in the last 8760 hours. HbA1C: No results for input(s): "HGBA1C" in the last 72 hours.  CBG: No results for input(s): "GLUCAP" in the last 168 hours. Lipid Profile: Recent Labs    04/14/23 0451  CHOL 150  HDL 57  LDLCALC 85  TRIG 41  CHOLHDL 2.6   Thyroid Function Tests: No results for input(s): "TSH", "T4TOTAL", "FREET4", "T3FREE", "THYROIDAB" in the last 72 hours. Anemia Panel: No results for input(s): "VITAMINB12", "FOLATE", "FERRITIN", "TIBC", "IRON", "RETICCTPCT" in the last 72 hours. Sepsis Labs: No results  for input(s): "PROCALCITON", "LATICACIDVEN" in the last 168 hours.  Recent Results (from the past 240 hour(s))  SARS Coronavirus 2 by RT PCR (hospital order, performed in [facility] hospital lab) *cepheid single result test* Anterior Nasal Swab     Status: None   Collection Time: 04/13/23  2:12 PM   Specimen: Anterior Nasal Swab  Result Value Ref Range Status   SARS Coronavirus 2 by RT PCR NEGATIVE NEGATIVE Final    Comment: (NOTE) SARS-CoV-2 target nucleic acids are NOT DETECTED.  The SARS-CoV-2 RNA is generally detectable in upper and lower respiratory specimens during the acute phase of infection. The lowest concentration of SARS-CoV-2 viral copies this assay can detect is 250 copies / mL. A negative result does not preclude SARS-CoV-2 infection and should not be used as the sole basis for treatment or other patient management decisions.  A negative result may occur with improper specimen collection / handling, submission of specimen  other than nasopharyngeal swab, presence of viral mutation(s) within the areas targeted by this assay, and inadequate number of viral copies (<250 copies / mL). A negative result must be combined with clinical observations, patient history, and epidemiological information.  Fact Sheet for Patients:   RoadLapTop.co.za  Fact Sheet for Healthcare Providers: http://kim-miller.com/  This test is not yet approved or  cleared by the [place] FDA and has been authorized for detection and/or diagnosis of SARS-CoV-2 by FDA under an Emergency Use Authorization (EUA).  This EUA will remain in effect (meaning this test can be used) for the duration of the COVID-19 declaration under Section 564(b)(1) of the Act, 21 U.S.C. section 360bbb-3(b)(1), unless the authorization is terminated or revoked sooner.  Performed at [facility], [address], [place], [place] [address]          Radiology  Studies: No results found.      Scheduled Meds:  apixaban  5 mg Oral BID   arformoterol  15 mcg Nebulization BID   budesonide (PULMICORT) nebulizer solution  0.25 mg Nebulization BID   diltiazem  360 mg Oral Daily   empagliflozin  10 mg Oral Daily   furosemide  40 mg Intravenous Q12H   [START ON 04/17/2023] furosemide  40 mg Oral Daily   ipratropium-albuterol  3 mL Nebulization Q4H   methylPREDNISolone (SOLU-MEDROL) injection  60 mg Intravenous Daily   metoprolol tartrate  50 mg Oral BID   nicotine  21 mg Transdermal Daily   Continuous Infusions:     LOS: 3 days     Tresa Moore, MD [facility]   If 7PM-7AM, please contact night-coverage  04/16/2023, 2:14 PM

## 2023-04-16 NOTE — Progress Notes (Signed)
Made Dr. Georgeann Oppenheim aware that patient is in bigeminy. MD acknowledged and gave no new orders.

## 2023-04-17 ENCOUNTER — Telehealth (HOSPITAL_COMMUNITY): Payer: Self-pay

## 2023-04-17 ENCOUNTER — Other Ambulatory Visit (HOSPITAL_COMMUNITY): Payer: Self-pay

## 2023-04-17 ENCOUNTER — Other Ambulatory Visit: Payer: Self-pay

## 2023-04-17 DIAGNOSIS — I5023 Acute on chronic systolic (congestive) heart failure: Secondary | ICD-10-CM | POA: Diagnosis not present

## 2023-04-17 LAB — BASIC METABOLIC PANEL
Anion gap: 11 (ref 5–15)
BUN: 31 mg/dL — ABNORMAL HIGH (ref 6–20)
CO2: 38 mmol/L — ABNORMAL HIGH (ref 22–32)
Calcium: 8.6 mg/dL — ABNORMAL LOW (ref 8.9–10.3)
Chloride: 92 mmol/L — ABNORMAL LOW (ref 98–111)
Creatinine, Ser: 1.09 mg/dL (ref 0.61–1.24)
GFR, Estimated: >60 mL/min (ref >60)
Glucose, Bld: 143 mg/dL — ABNORMAL HIGH (ref 70–99)
Potassium: 4.5 mmol/L (ref 3.5–5.1)
Sodium: 141 mmol/L (ref 135–145)

## 2023-04-17 LAB — CBC WITH DIFFERENTIAL/PLATELET
Abs Immature Granulocytes: 0.04 10*3/uL (ref 0.00–0.07)
Basophils Absolute: 0 10*3/uL (ref 0.0–0.1)
Basophils Relative: 0 %
Eosinophils Absolute: 0 10*3/uL (ref 0.0–0.5)
Eosinophils Relative: 0 %
HCT: 44.7 % (ref 39.0–52.0)
Hemoglobin: 13.9 g/dL (ref 13.0–17.0)
Immature Granulocytes: 1 %
Lymphocytes Relative: 6 %
Lymphs Abs: 0.5 10*3/uL — ABNORMAL LOW (ref 0.7–4.0)
MCH: 29.3 pg (ref 26.0–34.0)
MCHC: 31.1 g/dL (ref 30.0–36.0)
MCV: 94.1 fL (ref 80.0–100.0)
Monocytes Absolute: 0.2 10*3/uL (ref 0.1–1.0)
Monocytes Relative: 3 %
Neutro Abs: 7.3 10*3/uL (ref 1.7–7.7)
Neutrophils Relative %: 90 %
Platelets: 208 10*3/uL (ref 150–400)
RBC: 4.75 MIL/uL (ref 4.22–5.81)
RDW: 15.2 % (ref 11.5–15.5)
WBC: 8.1 10*3/uL (ref 4.0–10.5)
nRBC: 0 % (ref 0.0–0.2)

## 2023-04-17 LAB — PROCALCITONIN: Procalcitonin: <0.1 ng/mL

## 2023-04-17 MED ORDER — METOPROLOL TARTRATE 50 MG PO TABS
50.0000 mg | ORAL_TABLET | Freq: Two times a day (BID) | ORAL | 1 refills | Status: DC
  Filled 2023-04-17: qty 60, 30d supply, fill #0

## 2023-04-17 MED ORDER — IPRATROPIUM-ALBUTEROL 0.5-2.5 (3) MG/3ML IN SOLN: 3.0000 mL | Freq: Four times a day (QID) | RESPIRATORY_TRACT | Status: DC

## 2023-04-17 MED ORDER — TORSEMIDE 20 MG PO TABS
40.0000 mg | ORAL_TABLET | Freq: Every day | ORAL | 1 refills | Status: DC
  Filled 2023-04-17: qty 60, 30d supply, fill #0

## 2023-04-17 MED ORDER — ALBUTEROL SULFATE (2.5 MG/3ML) 0.083% IN NEBU
2.5000 mg | INHALATION_SOLUTION | Freq: Four times a day (QID) | RESPIRATORY_TRACT | 0 refills | Status: DC | PRN
Start: 2023-04-17 — End: 2023-07-10
  Filled 2023-04-17: qty 300, 25d supply, fill #0

## 2023-04-17 MED ORDER — ARFORMOTEROL TARTRATE 15 MCG/2ML IN NEBU
15.0000 ug | INHALATION_SOLUTION | Freq: Two times a day (BID) | RESPIRATORY_TRACT | 1 refills | Status: DC
Start: 2023-04-17 — End: 2023-07-10
  Filled 2023-04-17: qty 120, 30d supply, fill #0

## 2023-04-17 MED ORDER — PREDNISONE 20 MG PO TABS
40.0000 mg | ORAL_TABLET | Freq: Every day | ORAL | 0 refills | Status: AC
  Filled 2023-04-17: qty 10, 5d supply, fill #0

## 2023-04-17 MED ORDER — APIXABAN 5 MG PO TABS
5.0000 mg | ORAL_TABLET | Freq: Two times a day (BID) | ORAL | 1 refills | Status: DC
  Filled 2023-04-17: qty 60, 30d supply, fill #0

## 2023-04-17 MED ORDER — BUDESONIDE 0.5 MG/2ML IN SUSP
0.5000 mg | Freq: Two times a day (BID) | RESPIRATORY_TRACT | 1 refills | Status: DC
  Filled 2023-04-17: qty 120, 30d supply, fill #0

## 2023-04-17 MED ORDER — EMPAGLIFLOZIN 10 MG PO TABS
10.0000 mg | ORAL_TABLET | Freq: Every day | ORAL | 1 refills | Status: AC
  Filled 2023-04-17: qty 30, 30d supply, fill #0

## 2023-04-17 MED ORDER — LOSARTAN POTASSIUM 25 MG PO TABS
12.5000 mg | ORAL_TABLET | Freq: Every day | ORAL | 1 refills | Status: DC
  Filled 2023-04-17: qty 15, 30d supply, fill #0

## 2023-04-17 MED ORDER — ALBUTEROL SULFATE HFA 108 (90 BASE) MCG/ACT IN AERS
2.0000 | INHALATION_SPRAY | Freq: Four times a day (QID) | RESPIRATORY_TRACT | 2 refills | Status: DC | PRN
  Filled 2023-04-17: qty 6.7, 25d supply, fill #0

## 2023-04-17 MED ORDER — ORAL CARE MOUTH RINSE: 15.0000 mL | OROMUCOSAL | Status: DC | PRN

## 2023-04-17 MED ORDER — NICOTINE 21 MG/24HR TD PT24
21.0000 mg | MEDICATED_PATCH | Freq: Every day | TRANSDERMAL | 0 refills | Status: DC
  Filled 2023-04-17: qty 28, 28d supply, fill #0

## 2023-04-17 MED ORDER — DILTIAZEM HCL ER COATED BEADS 360 MG PO CP24
360.0000 mg | ORAL_CAPSULE | Freq: Every day | ORAL | 1 refills | Status: DC
  Filled 2023-04-17: qty 30, 30d supply, fill #0

## 2023-04-17 NOTE — Progress Notes (Signed)
Reviewed D/C instructions with pt, piv d/c, dressing applied. Pt's meds delivered, reviewed meds with pharmacist at bedside. No concerns noted.

## 2023-04-17 NOTE — TOC CM/SW Note (Addendum)
Per MD, patient gets his nebulizer medications through [facility] and has a machine that is getting old. CSW tried calling [employer] liaison but no answer. Sent secure email to see if he is eligible for a new machine and what he needs to refill medications. MD wants patient's sats rechecked this afternoon to see if he will need home oxygen or not.  Charlynn Court, CSW [phone]  4:12 pm: Patient will require a cab voucher to get home. Address on facesheet is correct. He has his key. Faxed cab voucher to [employer]. He will not require home oxygen. No further concerns. CSW signing off.  Charlynn Court, CSW [phone]

## 2023-04-17 NOTE — Consult Note (Signed)
   Heart Failure Nurse Navigator Note  HFmrEF 45-50%.  Mildly elevated pulmonary artery systolic pressures.  Moderate mitral valve regurgitation.  Moderate tricuspid valve regurgitation.  He presented to the emergency room complaints of worsening shortness of breath for greater than 4 days.  Son noted worsening bilateral leg edema.  BNP 1178.  Chest x-ray revealed cardiomegaly and vascular congestion.  Comorbidities:  Hypertension/asthma Atrial fibrillation on NOAC Obesity Continued tobacco abuse  Medications:  Apixaban 5 mg 2 times a day Diltiazem 360 mg daily Jardiance 10 mg daily Furosemide 40 mg by mouth daily Toprol tartrate 50 mg 2 times a day NicoDerm patch 21 mg daily  Labs:  Sodium 141, potassium 4.5, chloride 92, CO2 38, BUN 31, creatinine 1.09, estimated GFR greater than 60. Weight 122 mg Intake 700 mL Output 5100 mL  Initial meeting with patient who is lying quietly in bed in no acute distress.  There were no family members present.  Patient states he resides with his significant other and he is the one in charge of preparing the meals.  He states he does sometimes cook with salt and will add it at the table.  Explained the relationship between salt/ sodium and the reasoning for abstaining from using salt.  Also went over 64 ounce fluid restriction.  Is not sure how much that he drinks.  Went over the importance of daily weights and what to report.  Discussed being compliant with medications along with follow-up in the outpatient heart failure clinic for which she has an appointment on January 3 at 1 PM.  He has an 8% no-show ratio which is 3 out of 38 appointments.  Tresa Endo RN [facility]

## 2023-04-17 NOTE — TOC Benefit Eligibility Note (Signed)
Patient Product/process development scientist completed.    The patient is currently admitted and upon discharge could be taking Eliquis.  The current 30 day co-pay is $4.00.   The patient is currently admitted and upon discharge could be taking Jardiance.  The current 30 day co-pay is $4.00.   The patient is currently admitted and upon discharge could be taking Pulmicort Nebulizer.  The current 30 day co-pay is $4.00.   The patient is insured through [employer]   This test claim was processed through [facility]- copay amounts may vary at other pharmacies due to pharmacy/plan contracts, or as the patient moves through the different stages of their insurance plan.

## 2023-04-17 NOTE — Progress Notes (Signed)
Ambulated pt in the hallway, noticed some SOB, pt states this is his baseline, gets sob with activity, O2 sats remained in the 90s. HR in the 70s.

## 2023-04-17 NOTE — Discharge Summary (Signed)
Physician Discharge Summary  Kenneth Benson NFA:[id number] DOB: [birth year] DOA: 04/13/2023  PCP: Oneita Hurt, No  Admit date: 04/13/2023 Discharge date: 04/17/2023  Admitted From: Home Disposition:  Home  Recommendations for Outpatient Follow-up:  Follow up with PCP in 1-2 weeks   Home Health:No Equipment/Devices:none   Discharge Condition:Stable  CODE STATUS:FULL  Diet recommendation: Heart  Brief/Interim Summary:  62 y.o. male with medical history significant of CHF with EF of 45-50%, hypertension, COPD/asthma, atrial fibrillation on Eliquis, obesity, tobacco abuse, who presents with shortness of breath.   Patient states that he has shortness of breath for more than 4 days, which has been progressively worsening.  He has dry cough, no chest pain, fever or chills.  He has worsening bilateral leg edema.  He has loose stool, no nausea, vomiting or abdominal pain.  No active diarrhea.  Denies symptoms of UTI.  Patient is not using oxygen normally, found to have oxygen saturation 90% on room air, which improved to 93% on 2 L oxygen.   5/19: Patient continues to make good urine.  5 L net negative.  Remains short of breath and coughing.  Suspect worsening COPD.  5/20: Breath sounds improved.  Patient weaned to room air.  Remains somewhat short of breath but per patient this is baseline.  Discharge home.  All medications 57-month supply sent to [facility] medication management pharmacy.  5-day course of prednisone.  Aggressive nebulizer regimen.  Resume home diuretics and goal-directed medical therapy for heart failure.   Discharge Diagnoses:  Principal Problem:   Acute on chronic systolic CHF (congestive heart failure) (HCC) Active Problems:   COPD (chronic obstructive pulmonary disease) (HCC)   Myocardial injury   Paroxysmal atrial fibrillation with RVR (HCC)   HTN (hypertension)   Hypokalemia   Cigarette smoker   Lung nodule   Obesity (BMI 30-39.9)  Acute on chronic systolic CHF (congestive  heart failure) (HCC):  2D echo 12/18/2022 showed EF of 45-50%.  Patient has 2+ leg edema, positive JVD, fine crackles on auscultation, significantly elevated BNP 1178, clinically consistent with CHF exacerbation. Plan: Diuresed effectively.  Approaching euvolemia.  Resume home torsemide 40 mg daily   Acute exacerbation of COPD Initially noted to have wheezing in ED.  That had resolved.  Now recurred on 5/19.  Will treat as decompensated COPD. Plan: On room air at time of discharge.  Wheezing improved.  Still remains somewhat short of breath.  Per patient this is baseline.  Can resume home bronchodilator regimen.  5-day course of prednisone prescribed.  Discharge Instructions  Discharge Instructions     Ambulatory Referral for Lung Cancer Scre   Complete by: As directed    Diet - low sodium heart healthy   Complete by: As directed    Increase activity slowly   Complete by: As directed       Allergies as of 04/17/2023   No Known Allergies      Medication List     STOP taking these medications    mometasone-formoterol 200-5 MCG/ACT Aero Commonly known as: DULERA       TAKE these medications    albuterol (2.5 MG/3ML) 0.083% nebulizer solution Commonly known as: PROVENTIL Take 3 mLs (2.5 mg total) by nebulization every 6 (six) hours as needed for wheezing or shortness of breath.   albuterol 108 (90 Base) MCG/ACT inhaler Commonly known as: VENTOLIN HFA Inhale 2 puffs into the lungs every 6 (six) hours as needed for wheezing or shortness of breath.   apixaban 5 MG  Tabs tablet Commonly known as: ELIQUIS Take 1 tablet (5 mg total) by mouth 2 (two) times daily.   arformoterol 15 MCG/2ML Nebu Commonly known as: Brovana Take 2 mLs (15 mcg total) by nebulization 2 (two) times daily.   budesonide 0.5 MG/2ML nebulizer solution Commonly known as: PULMICORT Take 2 mLs (0.5 mg total) by nebulization 2 (two) times daily.   diltiazem 360 MG 24 hr capsule Commonly known as:  CARDIZEM CD Take 1 capsule (360 mg total) by mouth daily.   empagliflozin 10 MG Tabs tablet Commonly known as: JARDIANCE Take 1 tablet (10 mg total) by mouth daily.   losartan 25 MG tablet Commonly known as: COZAAR Take 0.5 tablets (12.5 mg total) by mouth daily.   metoprolol tartrate 50 MG tablet Commonly known as: LOPRESSOR Take 1 tablet (50 mg total) by mouth 2 (two) times daily.   nicotine 21 mg/24hr patch Commonly known as: NICODERM CQ - dosed in mg/24 hours Place 1 patch (21 mg total) onto the skin daily. Start taking on: Apr 18, 2023   predniSONE 20 MG tablet Commonly known as: DELTASONE Take 2 tablets (40 mg total) by mouth daily with breakfast for 5 days. Start taking on: Apr 18, 2023   torsemide 20 MG tablet Commonly known as: DEMADEX Take 2 tablets (40 mg total) by mouth daily.               Durable Medical Equipment  (From admission, onward)           Start     Ordered   04/17/23 1120  For home use only DME Nebulizer machine  Once       Question Answer Comment  Patient needs a nebulizer to treat with the following condition COPD exacerbation (HCC)   Length of Need Lifetime      04/17/23 1119            No Known Allergies  Consultations: None   Procedures/Studies: DG Chest Port 1 View  Result Date: 04/16/2023 CLINICAL DATA:  200808 Hypoxia [id number] EXAM: PORTABLE CHEST - 1 VIEW COMPARISON:  04/13/2023 FINDINGS: Low lung volumes. Worsening left basilar and right infrahilar airspace opacities. Stable mild cardiomegaly. Blunting of left lateral costophrenic angle suggesting small effusion. Visualized bones unremarkable. IMPRESSION: Worsening left basilar and right infrahilar airspace disease. Electronically Signed   By: Corlis Leak M.D.   On: 04/16/2023 15:53   CT CHEST WO CONTRAST  Result Date: 04/13/2023 CLINICAL DATA:  Respiratory illness, nondiagnostic xray EXAM: CT CHEST WITHOUT CONTRAST TECHNIQUE: Multidetector CT imaging of the chest  was performed following the standard protocol without IV contrast. RADIATION DOSE REDUCTION: This exam was performed according to the departmental dose-optimization program which includes automated exposure control, adjustment of the mA and/or kV according to patient size and/or use of iterative reconstruction technique. COMPARISON:  January 30, 2022 FINDINGS: Evaluation is limited by respiratory motion. Cardiovascular: Cardiomegaly. Predominately LEFT-sided coronary artery atherosclerotic calcifications. Nonaneurysmal thoracic aorta. Enlargement of the main pulmonary artery in relation to the ascending thoracic aorta. Trace pericardial effusion. Mediastinum/Nodes: Visualized thyroid is unremarkable. No axillary adenopathy. There are multiple mildly enlarged mediastinal lymph nodes. Representative pretracheal lymph node measures 13 mm in the short axis, previously 9 mm (series 2, image 46). Subcarinal lymph node measures 16 mm in the short axis, previously 13 mm (series 2, image 71). Preaortic lymph node measures 9 mm in the short axis, previously 7 mm (series 2, image 22). Lungs/Pleura: Trace LEFT pleural effusion. Evaluation of the pulmonary parenchyma  is limited secondary to respiratory motion. Bronchial wall thickening. Scattered atelectasis. Favored background of mild interlobular septal thickening. LEFT upper lobe nodule versus scar measures 4 mm, obscured on prior exam (series 4, image 38). Upper Abdomen: Mildly lobular liver contours with separation of the fissures. Calcification of the gallbladder wall versus cholelithiasis. Small volume free fluid of the upper abdomen. Musculoskeletal: No acute osseous abnormality. IMPRESSION: 1. Cardiomegaly with trace LEFT pleural effusion, bronchial wall thickening and mild interlobular septal thickening. Findings are favored to reflect mild pulmonary edema. 2. Enlargement of the main pulmonary artery in relation to the ascending thoracic aorta, which can be seen in the  setting of pulmonary arterial hypertension. 3. Mild mediastinal adenopathy, likely reactive. 4. Mildly lobular liver contours with separation of the fissures. This is nonspecific but can be seen in the setting of cirrhosis. 5. Small volume free fluid of the upper abdomen. 6. Predominately LEFT-sided coronary artery atherosclerotic calcifications. 7. Incidental 4 mm LEFT upper lobe pulmonary nodule. No follow-up needed if patient is low-risk.This recommendation follows the consensus statement: Guidelines for Management of Incidental Pulmonary Nodules Detected on CT Images: From the Fleischner Society 2017; Radiology 2017; 284:228-243. Electronically Signed   By: Meda Klinefelter M.D.   On: 04/13/2023 12:51   DG Chest 2 View  Result Date: 04/13/2023 CLINICAL DATA:  Shortness of breath EXAM: CHEST - 2 VIEW COMPARISON:  CXR 12/17/22 FINDINGS: No pleural effusion. No pneumothorax. Cardiomegaly. Hazy opacity at the left lung base could represent atelectasis or infection. There are prominent bilateral interstitial opacities could represent pulmonary venous congestion. No radiographically apparent displaced rib fractures. Visualized upper abdomen is unremarkable. Vertebral body heights are maintained. IMPRESSION: 1.  Cardiomegaly and pulmonary venous congestion. 2. There is a hazy opacity at the left lung base, which could represent atelectasis or infection Electronically Signed   By: Lorenza Cambridge M.D.   On: 04/13/2023 10:55      Subjective: Seen and examined on the day of discharge.  Stable no distress.  Appropriate for discharge home.  Discharge Exam: Vitals:   04/17/23 1300 04/17/23 1347  BP:    Pulse:    Resp: 20 20  Temp:    SpO2:  93%   Vitals:   04/17/23 1200 04/17/23 1256 04/17/23 1300 04/17/23 1347  BP: (!) 97/59     Pulse: 89     Resp: 20 20 20 20   Temp: 98.3 F (36.8 C)     TempSrc: Oral     SpO2: 92% 91%  93%  Weight:      Height:        General: Pt is alert, awake, not in  acute distress Cardiovascular: RRR, S1/S2 +, no rubs, no gallops Respiratory: CTA bilaterally, no wheezing, no rhonchi Abdominal: Soft, NT, ND, bowel sounds + Extremities: no edema, no cyanosis    The results of significant diagnostics from this hospitalization (including imaging, microbiology, ancillary and laboratory) are listed below for reference.     Microbiology: Recent Results (from the past 240 hour(s))  SARS Coronavirus 2 by RT PCR (hospital order, performed in [facility] hospital lab) *cepheid single result test* Anterior Nasal Swab     Status: None   Collection Time: 04/13/23  2:12 PM   Specimen: Anterior Nasal Swab  Result Value Ref Range Status   SARS Coronavirus 2 by RT PCR NEGATIVE NEGATIVE Final    Comment: (NOTE) SARS-CoV-2 target nucleic acids are NOT DETECTED.  The SARS-CoV-2 RNA is generally detectable in upper and lower respiratory specimens  during the acute phase of infection. The lowest concentration of SARS-CoV-2 viral copies this assay can detect is 250 copies / mL. A negative result does not preclude SARS-CoV-2 infection and should not be used as the sole basis for treatment or other patient management decisions.  A negative result may occur with improper specimen collection / handling, submission of specimen other than nasopharyngeal swab, presence of viral mutation(s) within the areas targeted by this assay, and inadequate number of viral copies (<250 copies / mL). A negative result must be combined with clinical observations, patient history, and epidemiological information.  Fact Sheet for Patients:   RoadLapTop.co.za  Fact Sheet for Healthcare Providers: http://kim-miller.com/  This test is not yet approved or  cleared by the [place] FDA and has been authorized for detection and/or diagnosis of SARS-CoV-2 by FDA under an Emergency Use Authorization (EUA).  This EUA will remain in effect  (meaning this test can be used) for the duration of the COVID-19 declaration under Section 564(b)(1) of the Act, 21 U.S.C. section 360bbb-3(b)(1), unless the authorization is terminated or revoked sooner.  Performed at [facility], [address] [place], [place] [address]      Labs: BNP (last 3 results) Recent Labs    12/21/22 0328 04/13/23 1037  BNP 1,985.5* 1,178.2*   Basic Metabolic Panel: Recent Labs  Lab 04/13/23 1037 04/13/23 1412 04/14/23 0451 04/15/23 0831 04/16/23 0414 04/17/23 0500  NA 141  --  143 141 138 141  K 3.3*  --  3.9 3.6 3.9 4.5  CL 96*  --  95* 92* 90* 92*  CO2 34*  --  39* 39* 38* 38*  GLUCOSE 121*  --  131* 116* 88 143*  BUN 13  --  18 23* 26* 31*  CREATININE 0.98  --  1.24 1.16 1.17 1.09  CALCIUM 8.6*  --  8.2* 8.1* 7.9* 8.6*  MG  --  2.3 2.2  --   --   --    Liver Function Tests: No results for input(s): "AST", "ALT", "ALKPHOS", "BILITOT", "PROT", "ALBUMIN" in the last 168 hours. No results for input(s): "LIPASE", "AMYLASE" in the last 168 hours. No results for input(s): "AMMONIA" in the last 168 hours. CBC: Recent Labs  Lab 04/13/23 1037 04/17/23 0500  WBC 5.3 8.1  NEUTROABS  --  7.3  HGB 15.1 13.9  HCT 49.6 44.7  MCV 96.7 94.1  PLT 215 208   Cardiac Enzymes: No results for input(s): "CKTOTAL", "CKMB", "CKMBINDEX", "TROPONINI" in the last 168 hours. BNP: Invalid input(s): "POCBNP" CBG: No results for input(s): "GLUCAP" in the last 168 hours. D-Dimer No results for input(s): "DDIMER" in the last 72 hours. Hgb A1c No results for input(s): "HGBA1C" in the last 72 hours. Lipid Profile No results for input(s): "CHOL", "HDL", "LDLCALC", "TRIG", "CHOLHDL", "LDLDIRECT" in the last 72 hours. Thyroid function studies No results for input(s): "TSH", "T4TOTAL", "T3FREE", "THYROIDAB" in the last 72 hours.  Invalid input(s): "FREET3" Anemia work up No results for input(s): "VITAMINB12", "FOLATE", "FERRITIN", "TIBC",  "IRON", "RETICCTPCT" in the last 72 hours. Urinalysis    Component Value Date/Time   COLORURINE YELLOW (A) 12/16/2022 1305   APPEARANCEUR CLEAR (A) 12/16/2022 1305   LABSPEC 1.026 12/16/2022 1305   PHURINE 7.0 12/16/2022 1305   GLUCOSEU NEGATIVE 12/16/2022 1305   HGBUR NEGATIVE 12/16/2022 1305   BILIRUBINUR NEGATIVE 12/16/2022 1305   KETONESUR NEGATIVE 12/16/2022 1305   PROTEINUR 30 (A) 12/16/2022 1305   NITRITE NEGATIVE 12/16/2022 1305   LEUKOCYTESUR  NEGATIVE 12/16/2022 1305   Sepsis Labs Recent Labs  Lab 04/13/23 1037 04/17/23 0500  WBC 5.3 8.1   Microbiology Recent Results (from the past 240 hour(s))  SARS Coronavirus 2 by RT PCR (hospital order, performed in [facility] hospital lab) *cepheid single result test* Anterior Nasal Swab     Status: None   Collection Time: 04/13/23  2:12 PM   Specimen: Anterior Nasal Swab  Result Value Ref Range Status   SARS Coronavirus 2 by RT PCR NEGATIVE NEGATIVE Final    Comment: (NOTE) SARS-CoV-2 target nucleic acids are NOT DETECTED.  The SARS-CoV-2 RNA is generally detectable in upper and lower respiratory specimens during the acute phase of infection. The lowest concentration of SARS-CoV-2 viral copies this assay can detect is 250 copies / mL. A negative result does not preclude SARS-CoV-2 infection and should not be used as the sole basis for treatment or other patient management decisions.  A negative result may occur with improper specimen collection / handling, submission of specimen other than nasopharyngeal swab, presence of viral mutation(s) within the areas targeted by this assay, and inadequate number of viral copies (<250 copies / mL). A negative result must be combined with clinical observations, patient history, and epidemiological information.  Fact Sheet for Patients:   RoadLapTop.co.za  Fact Sheet for Healthcare Providers: http://kim-miller.com/  This test is not yet  approved or  cleared by the [place] FDA and has been authorized for detection and/or diagnosis of SARS-CoV-2 by FDA under an Emergency Use Authorization (EUA).  This EUA will remain in effect (meaning this test can be used) for the duration of the COVID-19 declaration under Section 564(b)(1) of the Act, 21 U.S.C. section 360bbb-3(b)(1), unless the authorization is terminated or revoked sooner.  Performed at [facility], [address], [place], [place] [address]      Time coordinating discharge: Over 30 minutes  SIGNED:   Tresa Moore, MD  [facility] 04/17/2023, 2:45 PM Pager   If 7PM-7AM, please contact night-coverage

## 2023-04-17 NOTE — Telephone Encounter (Signed)
Pharmacy Patient Advocate Encounter  Insurance verification completed.    The patient is insured through [employer]   The patient is currently admitted and ran test claims for the following: Jardiance, Eliquis, Pulmicort.  Copays and coinsurance results were relayed to Inpatient clinical team.

## 2023-04-18 ENCOUNTER — Other Ambulatory Visit: Payer: Self-pay

## 2023-04-26 ENCOUNTER — Encounter: Payer: Medicaid Other | Admitting: Family

## 2023-04-28 ENCOUNTER — Ambulatory Visit: Payer: Self-pay | Admitting: Family Medicine

## 2023-04-28 ENCOUNTER — Ambulatory Visit (INDEPENDENT_AMBULATORY_CARE_PROVIDER_SITE_OTHER): Payer: Medicaid Other | Admitting: Family Medicine

## 2023-04-28 DIAGNOSIS — Z91199 Patient's noncompliance with other medical treatment and regimen due to unspecified reason: Secondary | ICD-10-CM

## 2023-04-28 NOTE — Progress Notes (Signed)
Patient was not seen for appt d/t no call, no show, or late arrival >10 mins past appt time. Of note, patient did not have his photo ID with him and did not match his MyChart appearance, no facial hair, shaved head.   Jacky Kindle, FNP  [facility] [address] [place], [place] [address] [phone] (phone) [phone] (fax) [facility]

## 2023-05-01 ENCOUNTER — Telehealth: Payer: Self-pay | Admitting: Family

## 2023-05-01 ENCOUNTER — Encounter: Payer: Medicaid Other | Admitting: Family

## 2023-05-01 ENCOUNTER — Other Ambulatory Visit: Payer: Self-pay

## 2023-05-01 NOTE — Progress Notes (Deleted)
PCP: Primary Cardiologist:  HPI:  Kenneth Benson is a 62 y/o male with a history of atrial fibrillation, asthma, COPD, current tobacco use and chronic heart failure.   Echo 12/18/22 showed an EF of 45-50% along with mildly elevated PA pressure and moderate Kenneth/ TR  Admitted 04/13/23 due to SOB , dry cough and worsening leg edema. Thought to be due to COPD/ HF exacerbation. Diuresed with improvement of symptoms. Weaned off oxygen to room air. Admitted 12/16/22 due to AF/ HF exacerbation. Diuresed. Initially on bipap and trilogy to be delivered to patient's home.   He presents today for a HF follow-up visit with a chief complaint of    Missed his NP PCP appt on 04/28/23     ROS: All systems negative except as listed in HPI, PMH and Problem List.  SH:  Social History   Socioeconomic History   Marital status: Single    Spouse name: Not on file   Number of children: Not on file   Years of education: Not on file   Highest education level: Not on file  Occupational History   Not on file  Tobacco Use   Smoking status: Every Day    Packs/day: 1.50    Years: 27.00    Additional pack years: 0.00    Total pack years: 40.50    Types: Cigarettes   Smokeless tobacco: Not on file   Tobacco comments:    0.5PPD 02/15/2023  Vaping Use   Vaping Use: Never used  Substance and Sexual Activity   Alcohol use: No   Drug use: No   Sexual activity: Not on file  Other Topics Concern   Not on file  Social History Narrative   Not on file   Social Determinants of Health   Financial Resource Strain: Not on file  Food Insecurity: No Food Insecurity (04/13/2023)   Hunger Vital Sign    Worried About Running Out of Food in the Last Year: Never true    Ran Out of Food in the Last Year: Never true  Transportation Needs: No Transportation Needs (04/13/2023)   PRAPARE - Administrator, Civil Service (Medical): No    Lack of Transportation (Non-Medical): No  Physical Activity: Not on file   Stress: Not on file  Social Connections: Not on file  Intimate Partner Violence: Not At Risk (04/13/2023)   Humiliation, Afraid, Rape, and Kick questionnaire    Fear of Current or Ex-Partner: No    Emotionally Abused: No    Physically Abused: No    Sexually Abused: No    FH:  Family History  Problem Relation Age of Onset   Heart disease Mother    Atrial fibrillation Father     Past Medical History:  Diagnosis Date   Arrhythmia    atrial fibrillation   Asthma    CHF (congestive heart failure) (HCC)    COPD (chronic obstructive pulmonary disease) (HCC)    Tobacco abuse     Current Outpatient Medications  Medication Sig Dispense Refill   albuterol (PROVENTIL) (2.5 MG/3ML) 0.083% nebulizer solution Take 3 mLs (2.5 mg total) by nebulization every 6 (six) hours as needed for wheezing or shortness of breath. 720 mL 0   albuterol (VENTOLIN HFA) 108 (90 Base) MCG/ACT inhaler Inhale 2 puffs into the lungs every 6 (six) hours as needed for wheezing or shortness of breath. 6.7 g 2   apixaban (ELIQUIS) 5 MG TABS tablet Take 1 tablet (5 mg total) by mouth 2 (two)  times daily. 60 tablet 1   arformoterol (BROVANA) 15 MCG/2ML NEBU Take 2 mLs (15 mcg total) by nebulization 2 (two) times daily. 120 mL 1   budesonide (PULMICORT) 0.5 MG/2ML nebulizer solution Take 2 mLs (0.5 mg total) by nebulization 2 (two) times daily. 120 mL 1   diltiazem (CARDIZEM CD) 360 MG 24 hr capsule Take 1 capsule (360 mg total) by mouth daily. 30 capsule 1   empagliflozin (JARDIANCE) 10 MG TABS tablet Take 1 tablet (10 mg total) by mouth daily. 30 tablet 1   losartan (COZAAR) 25 MG tablet Take 0.5 tablets (12.5 mg total) by mouth daily. 15 tablet 1   metoprolol tartrate (LOPRESSOR) 50 MG tablet Take 1 tablet (50 mg total) by mouth 2 (two) times daily. 60 tablet 1   nicotine (NICODERM CQ - DOSED IN MG/24 HOURS) 21 mg/24hr patch Place 1 patch (21 mg total) onto the skin daily. 28 patch 0   torsemide (DEMADEX) 20 MG tablet  Take 2 tablets (40 mg total) by mouth daily. 60 tablet 1   No current facility-administered medications for this visit.      PHYSICAL EXAM:  General:  Well appearing. No resp difficulty HEENT: normal Neck: supple. JVP flat. Carotids 2+ bilaterally; no bruits. No lymphadenopathy or thryomegaly appreciated. Cor: PMI normal. Regular rate & rhythm. No rubs, gallops or murmurs. Lungs: clear Abdomen: soft, nontender, nondistended. No hepatosplenomegaly. No bruits or masses. Good bowel sounds. Extremities: no cyanosis, clubbing, rash, edema Neuro: alert & orientedx3, cranial nerves grossly intact. Moves all 4 extremities w/o difficulty. Affect pleasant.   ECG:   ASSESSMENT & PLAN: 1: Chronic heart failure with mildly reduced ejection fraction- - NYHA class II - euvolemic - weighing daily & notes gradual weight gain; reminded to call for an overnight weight gain of > 2 pounds or a weekly weight gain of > 5 pounds - weight 256.6 pounds from last visit here 2 months ago - continue jardiance 10mg  daily - continue metoprolol tartrate 50mg  BID - continue torsemide 40mg  daily - continue losartan 12.5mg  daily - not adding salt to his food - BNP 04/13/23 was 1178.2   2: Atrial fibrillation- - saw cardiology Beatrix Fetters) 04/23 - cardizem 360mg ; not indicated in reduced EF so may need to consider managing with metoprolol succinate if able - apixaban 5mg  BID  - diltiazem CD 360mg  daily  3: COPD- - saw pulmonology (Kasa) 03/24 - trilogy at bedtime - PFT's  - BMP 04/17/23 showed sodium 141, potassium 4.5, creatinine 1.09 & GFR >60  4: Tobacco use- - smokes daily - cessation discussed - appt at [facility] was on 04/28/23 but he was not seen

## 2023-05-01 NOTE — Telephone Encounter (Signed)
Patient did not show for his Heart Failure Clinic appointment on 05/01/23

## 2023-05-05 ENCOUNTER — Other Ambulatory Visit: Payer: Self-pay

## 2023-05-11 ENCOUNTER — Ambulatory Visit (INDEPENDENT_AMBULATORY_CARE_PROVIDER_SITE_OTHER): Payer: Medicaid Other | Admitting: Internal Medicine

## 2023-05-11 ENCOUNTER — Encounter: Payer: Self-pay | Admitting: Internal Medicine

## 2023-05-11 VITALS — BP 94/60 | HR 75 | Temp 98.3°F | Ht 72.0 in | Wt 264.8 lb

## 2023-05-11 DIAGNOSIS — J9611 Chronic respiratory failure with hypoxia: Secondary | ICD-10-CM

## 2023-05-11 DIAGNOSIS — F17210 Nicotine dependence, cigarettes, uncomplicated: Secondary | ICD-10-CM | POA: Diagnosis not present

## 2023-05-11 DIAGNOSIS — Z720 Tobacco use: Secondary | ICD-10-CM

## 2023-05-11 DIAGNOSIS — J9612 Chronic respiratory failure with hypercapnia: Secondary | ICD-10-CM

## 2023-05-11 DIAGNOSIS — J449 Chronic obstructive pulmonary disease, unspecified: Secondary | ICD-10-CM | POA: Diagnosis not present

## 2023-05-11 NOTE — Patient Instructions (Signed)
PULMICORT NEBULIZER TWICE DAILY USE ALBUTEROL NEBULIZER AS NEEDED EVERY 6 HRS USE TRILOGY AS PRESCRIBED EVERY TIME YOU SLEEP Avoid secondhand smoke Avoid SICK contacts Recommend  Masking  when appropriate Recommend Keep up-to-date with vaccinations PLEASE STOP SMOKING   REFERRAL FOR PCP NEEDED

## 2023-05-11 NOTE — Progress Notes (Signed)
Kenneth Benson, male    DOB: Feb 20, 1961   MRN: [id number]   Discharge Diagnoses:   Paroxysmal atrial fibrillation with RVR (HCC)   Acute hypoxic respiratory failure (HCC)   COPD exacerbation (HCC)   Acute on chronic combined systolic and diastolic CHF   AKI (acute kidney injury) (HCC)   Hyperkalemia   Morbid obesity (HCC)   Moderate tricuspid regurgitation       CC Assessment for End stage COPD   HPI End-stage COPD Progressive shortness of breath and dyspnea on exertion with respiratory insufficiency Uses Trilogy needs this for survival EDUCATION PROVIDED THAT HE MUST USE THIS EVERY NIGHT    PATIENT UNABLE TO USE TRADITIONAL INHALER THERAPY AND WILL NEED NEBULIZER THERAPY AS MAINTENANCE   No exacerbation at this time No evidence of heart failure at this time No evidence or signs of infection at this time No respiratory distress No fevers, chills, nausea, vomiting, diarrhea No evidence of lower extremity edema No evidence hemoptysis     Smoking Assessment and Cessation Counseling Upon further questioning, Patient smokes 1/2 PPD I have advised patient to quit/stop smoking as soon as possible due to high risk for multiple medical problems  Patient  is NOT willing to quit smoking  I have advised patient that we can assist and have options of Nicotine replacement therapy. I also advised patient on behavioral therapy and can provide oral medication therapy in conjunction with the other therapies Follow up next Office visit  for assessment of smoking cessation Smoking cessation counseling advised for 4 minutes   Past Medical History:  Diagnosis Date   Arrhythmia    atrial fibrillation   Asthma    CHF (congestive heart failure) (HCC)    COPD (chronic obstructive pulmonary disease) (HCC)    Tobacco abuse     Outpatient Medications Prior to Visit  Medication Sig Dispense Refill   apixaban (ELIQUIS) 5 MG TABS tablet Take 1 tablet (5 mg total) by mouth 2 (two)  times daily. 60 tablet 5   diltiazem (CARDIZEM CD) 360 MG 24 hr capsule Take 1 capsule (360 mg total) by mouth daily. 30 capsule 5   empagliflozin (JARDIANCE) 10 MG TABS tablet Take 1 tablet (10 mg total) by mouth daily. 30 tablet 5   metoprolol tartrate (LOPRESSOR) 50 MG tablet Take 1 tablet (50 mg total) by mouth 2 (two) times daily. 60 tablet 5   mometasone-formoterol (DULERA) 200-5 MCG/ACT AERO Inhale 2 puffs into the lungs 2 (two) times daily. 13 g 0   torsemide (DEMADEX) 20 MG tablet Take 2 tablets (40 mg total) by mouth daily. 60 tablet 5       3    There were no vitals taken for this visit.       Review of Systems: Gen:  Denies  fever, sweats, chills weight loss  HEENT: Denies blurred vision, double vision, ear pain, eye pain, hearing loss, nose bleeds, sore throat Cardiac:  No dizziness, chest pain or heaviness, chest tightness,edema, No JVD Resp:   No cough, -sputum production, +shortness of breath,+  wheezing, -hemoptysis,  Other:  All other systems negative   Physical Examination:   General Appearance: No distress  EYES PERRLA, EOM intact.   NECK Supple, No JVD Pulmonary: normal breath sounds, No wheezing.  CardiovascularNormal S1,S2.  No m/r/g.   Abdomen: Benign, Soft, non-tender. Neurology UE/LE 5/5 strength, no focal deficits Ext pulses intact, cap refill intact ALL OTHER ROS ARE NEGATIVE    CBC  Component Value Date/Time   WBC 8.1 04/17/2023 0500   RBC 4.75 04/17/2023 0500   HGB 13.9 04/17/2023 0500   HGB 15.3 03/19/2015 1020   HCT 44.7 04/17/2023 0500   HCT 46.7 03/19/2015 1020   PLT 208 04/17/2023 0500   PLT 228 03/19/2015 1020   MCV 94.1 04/17/2023 0500   MCV 92 03/19/2015 1020   MCH 29.3 04/17/2023 0500   MCHC 31.1 04/17/2023 0500   RDW 15.2 04/17/2023 0500   RDW 15.0 (H) 03/19/2015 1020   LYMPHSABS 0.5 (L) 04/17/2023 0500   MONOABS 0.2 04/17/2023 0500   EOSABS 0.0 04/17/2023 0500   BASOSABS 0.0 04/17/2023 0500      Latest Ref Rng &  Units 04/17/2023    5:00 AM 04/16/2023    4:14 AM 04/15/2023    8:31 AM  BMP  Glucose 70 - 99 mg/dL 604  88  540   BUN 6 - 20 mg/dL 31  26  23    Creatinine 0.61 - 1.24 mg/dL 9.81  1.91  4.78   Sodium 135 - 145 mmol/L 141  138  141   Potassium 3.5 - 5.1 mmol/L 4.5  3.9  3.6   Chloride 98 - 111 mmol/L 92  90  92   CO2 22 - 32 mmol/L 38  38  39   Calcium 8.9 - 10.3 mg/dL 8.6  7.9  8.1      Assessment    Assessment and Plan:  62 year old white male seen today for follow-up assessment for end-stage COPD with progressive shortness of breath and dyspnea exertion with history of paroxysmal A-fib with RVR with acute on chronic combined systolic and diastolic heart failure with moderate tricuspid regurg in the setting of morbid obesity with respiratory insufficiency and deconditioned state    Chronic respiratory failure  Related to COPD and morbid obesity  Patient uses trilogy vent at night  Patient needs this for survival   End-stage COPD  Smoking cessation strongly advised  Patient was started on nebulized maintenance therapy last office visit  Currently on nebulized Pulmicort and albuterol nebs  Chronic respiratory insufficiency Continue Trilogy vent therapy and nebulized therapy as maintenance therapy  Paroxysmal atrial fibrillation with RVR (HCC) continue beta-blocker and calcium channel blocker, continue Eliquis. Follow-up cardiology  Acute on chronic combined diastolic and systolic congestive heart failure. Moderate tricuspid regurgitation. Echocardiogram showed ejection fraction 45 to 50% with moderate tricuspid regurgitation. Patient  was on BiPAP while in the hospital.  Trilogy has been set up at home. Follow-up cardiology as scheduled     Morbid obesity. Patient BMI is 36.74, patient has morbid obesity due to comorbidities with congestive heart failure.    Smoking cessation strongly advised  Obesity -recommend significant weight loss -recommend changing  diet  Deconditioned state -Recommend increased daily activity and exercise   MEDICATION ADJUSTMENTS/LABS AND TESTS ORDERED: PULMICORT NEBULIZER TWICE DAILY USE ALBUTEROL NEBULIZER AS NEEDED EVERY 6 HRS USE TRILOGY AS PRESCRIBED EVERY TIME YOU SLEEP Avoid secondhand smoke Avoid SICK contacts Recommend  Masking  when appropriate Recommend Keep up-to-date with vaccinations PLEASE STOP SMOKING   CURRENT MEDICATIONS REVIEWED AT LENGTH WITH PATIENT TODAY   Patient  satisfied with Plan of action and management. All questions answered  Follow up In 6 months  Total time spent 45 minutes   Lucie Leather, M.D.  [facility]  Medical Director [facility] Medical Director [facility] Cardio-Pulmonary Department

## 2023-05-16 ENCOUNTER — Other Ambulatory Visit (HOSPITAL_COMMUNITY): Payer: Self-pay

## 2023-07-02 ENCOUNTER — Encounter: Payer: Self-pay | Admitting: Internal Medicine

## 2023-07-02 ENCOUNTER — Inpatient Hospital Stay
Admission: EM | Admit: 2023-07-02 | Discharge: 2023-07-10 | DRG: 190 | Disposition: A | Discharge status: Home / Self Care | Payer: Medicaid Other | Attending: Internal Medicine | Admitting: Internal Medicine

## 2023-07-02 ENCOUNTER — Other Ambulatory Visit: Payer: Self-pay

## 2023-07-02 ENCOUNTER — Inpatient Hospital Stay: Payer: Medicaid Other

## 2023-07-02 ENCOUNTER — Emergency Department: Payer: Medicaid Other

## 2023-07-02 DIAGNOSIS — J9621 Acute and chronic respiratory failure with hypoxia: Secondary | ICD-10-CM | POA: Diagnosis present

## 2023-07-02 DIAGNOSIS — I4891 Unspecified atrial fibrillation: Secondary | ICD-10-CM | POA: Diagnosis not present

## 2023-07-02 DIAGNOSIS — J441 Chronic obstructive pulmonary disease with (acute) exacerbation: Secondary | ICD-10-CM | POA: Diagnosis present

## 2023-07-02 DIAGNOSIS — K4090 Unilateral inguinal hernia, without obstruction or gangrene, not specified as recurrent: Secondary | ICD-10-CM

## 2023-07-02 DIAGNOSIS — Z79899 Other long term (current) drug therapy: Secondary | ICD-10-CM | POA: Diagnosis not present

## 2023-07-02 DIAGNOSIS — I480 Paroxysmal atrial fibrillation: Secondary | ICD-10-CM | POA: Diagnosis present

## 2023-07-02 DIAGNOSIS — T461X5A Adverse effect of calcium-channel blockers, initial encounter: Secondary | ICD-10-CM | POA: Diagnosis not present

## 2023-07-02 DIAGNOSIS — Z6838 Body mass index (BMI) 38.0-38.9, adult: Secondary | ICD-10-CM

## 2023-07-02 DIAGNOSIS — I110 Hypertensive heart disease with heart failure: Secondary | ICD-10-CM | POA: Diagnosis present

## 2023-07-02 DIAGNOSIS — Z7984 Long term (current) use of oral hypoglycemic drugs: Secondary | ICD-10-CM

## 2023-07-02 DIAGNOSIS — I252 Old myocardial infarction: Secondary | ICD-10-CM

## 2023-07-02 DIAGNOSIS — I959 Hypotension, unspecified: Secondary | ICD-10-CM | POA: Diagnosis not present

## 2023-07-02 DIAGNOSIS — J449 Chronic obstructive pulmonary disease, unspecified: Secondary | ICD-10-CM | POA: Diagnosis not present

## 2023-07-02 DIAGNOSIS — I5042 Chronic combined systolic (congestive) and diastolic (congestive) heart failure: Secondary | ICD-10-CM | POA: Diagnosis present

## 2023-07-02 DIAGNOSIS — N5089 Other specified disorders of the male genital organs: Secondary | ICD-10-CM

## 2023-07-02 DIAGNOSIS — K4020 Bilateral inguinal hernia, without obstruction or gangrene, not specified as recurrent: Secondary | ICD-10-CM | POA: Diagnosis present

## 2023-07-02 DIAGNOSIS — J9601 Acute respiratory failure with hypoxia: Secondary | ICD-10-CM | POA: Diagnosis not present

## 2023-07-02 DIAGNOSIS — F17210 Nicotine dependence, cigarettes, uncomplicated: Secondary | ICD-10-CM | POA: Diagnosis present

## 2023-07-02 DIAGNOSIS — Z7901 Long term (current) use of anticoagulants: Secondary | ICD-10-CM | POA: Diagnosis not present

## 2023-07-02 DIAGNOSIS — Z8249 Family history of ischemic heart disease and other diseases of the circulatory system: Secondary | ICD-10-CM

## 2023-07-02 DIAGNOSIS — R0602 Shortness of breath: Secondary | ICD-10-CM | POA: Diagnosis present

## 2023-07-02 DIAGNOSIS — I5043 Acute on chronic combined systolic (congestive) and diastolic (congestive) heart failure: Secondary | ICD-10-CM | POA: Diagnosis present

## 2023-07-02 DIAGNOSIS — I10 Essential (primary) hypertension: Secondary | ICD-10-CM | POA: Diagnosis present

## 2023-07-02 DIAGNOSIS — Z7951 Long term (current) use of inhaled steroids: Secondary | ICD-10-CM

## 2023-07-02 DIAGNOSIS — Z1152 Encounter for screening for COVID-19: Secondary | ICD-10-CM | POA: Diagnosis not present

## 2023-07-02 DIAGNOSIS — Z91141 Patient's other noncompliance with medication regimen due to financial hardship: Secondary | ICD-10-CM

## 2023-07-02 DIAGNOSIS — R0902 Hypoxemia: Secondary | ICD-10-CM

## 2023-07-02 DIAGNOSIS — J9811 Atelectasis: Secondary | ICD-10-CM | POA: Diagnosis present

## 2023-07-02 DIAGNOSIS — R079 Chest pain, unspecified: Secondary | ICD-10-CM | POA: Diagnosis not present

## 2023-07-02 LAB — CBC
HCT: 50.6 % (ref 39.0–52.0)
Hemoglobin: 15.8 g/dL (ref 13.0–17.0)
MCH: 27.8 pg (ref 26.0–34.0)
MCHC: 31.2 g/dL (ref 30.0–36.0)
MCV: 88.9 fL (ref 80.0–100.0)
Platelets: 252 10*3/uL (ref 150–400)
RBC: 5.69 MIL/uL (ref 4.22–5.81)
RDW: 18.6 % — ABNORMAL HIGH (ref 11.5–15.5)
WBC: 6.8 10*3/uL (ref 4.0–10.5)
nRBC: 0 % (ref 0.0–0.2)

## 2023-07-02 LAB — HEPATIC FUNCTION PANEL
ALT: 17 U/L (ref 0–44)
AST: 16 U/L (ref 15–41)
Albumin: 3.9 g/dL (ref 3.5–5.0)
Alkaline Phosphatase: 40 U/L (ref 38–126)
Bilirubin, Direct: 0.2 mg/dL (ref 0.0–0.2)
Indirect Bilirubin: 0.7 mg/dL (ref 0.3–0.9)
Total Bilirubin: 0.9 mg/dL (ref 0.3–1.2)
Total Protein: 6.9 g/dL (ref 6.5–8.1)

## 2023-07-02 LAB — BRAIN NATRIURETIC PEPTIDE: B Natriuretic Peptide: 661 pg/mL — ABNORMAL HIGH (ref 0.0–100.0)

## 2023-07-02 LAB — BASIC METABOLIC PANEL
Anion gap: 13 (ref 5–15)
BUN: 23 mg/dL — ABNORMAL HIGH (ref 6–20)
CO2: 31 mmol/L (ref 22–32)
Calcium: 9 mg/dL (ref 8.9–10.3)
Chloride: 97 mmol/L — ABNORMAL LOW (ref 98–111)
Creatinine, Ser: 1.19 mg/dL (ref 0.61–1.24)
GFR, Estimated: >60 mL/min (ref >60)
Glucose, Bld: 146 mg/dL — ABNORMAL HIGH (ref 70–99)
Potassium: 3.7 mmol/L (ref 3.5–5.1)
Sodium: 141 mmol/L (ref 135–145)

## 2023-07-02 LAB — BLOOD GAS, VENOUS
Acid-Base Excess: 12.8 mmol/L — ABNORMAL HIGH (ref 0.0–2.0)
Bicarbonate: 43.3 mmol/L — ABNORMAL HIGH (ref 20.0–28.0)
O2 Saturation: 48.5 %
Patient temperature: 37
pCO2, Ven: 88 mmHg (ref 44–60)
pH, Ven: 7.3 (ref 7.25–7.43)
pO2, Ven: 32 mmHg (ref 32–45)

## 2023-07-02 LAB — RESP PANEL BY RT-PCR (FLU A&B, COVID) ARPGX2
Influenza A by PCR: NEGATIVE
Influenza B by PCR: NEGATIVE
SARS Coronavirus 2 by RT PCR: NEGATIVE

## 2023-07-02 LAB — TROPONIN I (HIGH SENSITIVITY)
Troponin I (High Sensitivity): 14 ng/L (ref <18)
Troponin I (High Sensitivity): 13 ng/L (ref <18)

## 2023-07-02 LAB — PROCALCITONIN: Procalcitonin: <0.1 ng/mL

## 2023-07-02 MED ORDER — METHYLPREDNISOLONE SODIUM SUCC 125 MG IJ SOLR
60.0000 mg | Freq: Two times a day (BID) | INTRAMUSCULAR | Status: AC
  Administered 2023-07-02 – 2023-07-03 (×2): 60 mg via INTRAVENOUS
  Filled 2023-07-02 (×2): qty 2

## 2023-07-02 MED ORDER — IPRATROPIUM-ALBUTEROL 0.5-2.5 (3) MG/3ML IN SOLN
3.0000 mL | Freq: Four times a day (QID) | RESPIRATORY_TRACT | Status: DC
  Administered 2023-07-02 – 2023-07-03 (×4): 3 mL via RESPIRATORY_TRACT
  Filled 2023-07-02 (×4): qty 3

## 2023-07-02 MED ORDER — DOCUSATE SODIUM 100 MG PO CAPS
100.0000 mg | ORAL_CAPSULE | Freq: Two times a day (BID) | ORAL | Status: DC
  Administered 2023-07-02 – 2023-07-10 (×13): 100 mg via ORAL
  Filled 2023-07-02 (×15): qty 1

## 2023-07-02 MED ORDER — SODIUM CHLORIDE 0.9 % IV BOLUS
500.0000 mL | Freq: Once | INTRAVENOUS | Status: AC
  Administered 2023-07-02: 500 mL via INTRAVENOUS

## 2023-07-02 MED ORDER — DILTIAZEM HCL-DEXTROSE 125-5 MG/125ML-% IV SOLN (PREMIX)
5.0000 mg/h | INTRAVENOUS | Status: DC
  Administered 2023-07-02: 5 mg/h via INTRAVENOUS
  Filled 2023-07-02: qty 125

## 2023-07-02 MED ORDER — ONDANSETRON HCL 4 MG/2ML IJ SOLN: 4.0000 mg | Freq: Four times a day (QID) | INTRAMUSCULAR | Status: DC | PRN

## 2023-07-02 MED ORDER — ALBUTEROL SULFATE (2.5 MG/3ML) 0.083% IN NEBU: 2.5000 mg | INHALATION_SOLUTION | RESPIRATORY_TRACT | Status: DC | PRN

## 2023-07-02 MED ORDER — ACETAMINOPHEN 325 MG PO TABS
650.0000 mg | ORAL_TABLET | Freq: Four times a day (QID) | ORAL | Status: DC | PRN
  Administered 2023-07-04: 650 mg via ORAL
  Filled 2023-07-02 (×2): qty 2

## 2023-07-02 MED ORDER — METHYLPREDNISOLONE SODIUM SUCC 125 MG IJ SOLR
125.0000 mg | INTRAMUSCULAR | Status: AC
  Administered 2023-07-02: 125 mg via INTRAVENOUS
  Filled 2023-07-02: qty 2

## 2023-07-02 MED ORDER — HYDRALAZINE HCL 20 MG/ML IJ SOLN: 5.0000 mg | INTRAMUSCULAR | Status: DC | PRN

## 2023-07-02 MED ORDER — HYDROCODONE-ACETAMINOPHEN 5-325 MG PO TABS
1.0000 | ORAL_TABLET | ORAL | Status: DC | PRN
  Administered 2023-07-09: 2 via ORAL
  Filled 2023-07-02: qty 2

## 2023-07-02 MED ORDER — LEVALBUTEROL HCL 1.25 MG/0.5ML IN NEBU
2.5000 mg | INHALATION_SOLUTION | Freq: Once | RESPIRATORY_TRACT | Status: AC
  Administered 2023-07-02: 2.5 mg via RESPIRATORY_TRACT
  Filled 2023-07-02: qty 1

## 2023-07-02 MED ORDER — BISACODYL 5 MG PO TBEC: 5.0000 mg | DELAYED_RELEASE_TABLET | Freq: Every day | ORAL | Status: DC | PRN

## 2023-07-02 MED ORDER — ONDANSETRON HCL 4 MG PO TABS: 4.0000 mg | ORAL_TABLET | Freq: Four times a day (QID) | ORAL | Status: DC | PRN

## 2023-07-02 MED ORDER — MORPHINE SULFATE (PF) 2 MG/ML IV SOLN: 2.0000 mg | INTRAVENOUS | Status: DC | PRN

## 2023-07-02 MED ORDER — SODIUM CHLORIDE 0.9% FLUSH
3.0000 mL | Freq: Two times a day (BID) | INTRAVENOUS | Status: DC
  Administered 2023-07-02 – 2023-07-09 (×15): 3 mL via INTRAVENOUS

## 2023-07-02 MED ORDER — POLYETHYLENE GLYCOL 3350 17 G PO PACK: 17.0000 g | PACK | Freq: Every day | ORAL | Status: DC | PRN

## 2023-07-02 MED ORDER — ALBUTEROL SULFATE HFA 108 (90 BASE) MCG/ACT IN AERS: 2.0000 | INHALATION_SPRAY | Freq: Four times a day (QID) | RESPIRATORY_TRACT | Status: DC | PRN

## 2023-07-02 MED ORDER — NICOTINE 14 MG/24HR TD PT24
14.0000 mg | MEDICATED_PATCH | Freq: Every day | TRANSDERMAL | Status: DC
  Administered 2023-07-02: 14 mg via TRANSDERMAL
  Filled 2023-07-02 (×7): qty 1

## 2023-07-02 MED ORDER — DILTIAZEM HCL 25 MG/5ML IV SOLN
INTRAVENOUS | Status: AC
  Administered 2023-07-02: 2.5 mg via INTRAVENOUS
  Filled 2023-07-02: qty 5

## 2023-07-02 MED ORDER — PREDNISONE 20 MG PO TABS: 40.0000 mg | ORAL_TABLET | Freq: Every day | ORAL | Status: DC

## 2023-07-02 MED ORDER — DILTIAZEM HCL 25 MG/5ML IV SOLN: 2.5000 mg | Freq: Once | INTRAVENOUS | Status: AC

## 2023-07-02 MED ORDER — GUAIFENESIN ER 600 MG PO TB12: 600.0000 mg | ORAL_TABLET | Freq: Two times a day (BID) | ORAL | Status: DC | PRN

## 2023-07-02 MED ORDER — APIXABAN 5 MG PO TABS
5.0000 mg | ORAL_TABLET | Freq: Two times a day (BID) | ORAL | Status: DC
  Administered 2023-07-02 – 2023-07-10 (×16): 5 mg via ORAL
  Filled 2023-07-02 (×16): qty 1

## 2023-07-02 MED ORDER — ALBUTEROL SULFATE (2.5 MG/3ML) 0.083% IN NEBU: 2.5000 mg | INHALATION_SOLUTION | Freq: Four times a day (QID) | RESPIRATORY_TRACT | Status: DC | PRN

## 2023-07-02 MED ORDER — IOHEXOL 350 MG/ML SOLN
100.0000 mL | Freq: Once | INTRAVENOUS | Status: AC | PRN
  Administered 2023-07-02: 100 mL via INTRAVENOUS

## 2023-07-02 MED ORDER — ACETAMINOPHEN 650 MG RE SUPP: 650.0000 mg | Freq: Four times a day (QID) | RECTAL | Status: DC | PRN

## 2023-07-02 NOTE — Assessment & Plan Note (Signed)
Per ED physician, patient had wheezing initially, but no wheezing on my examination, likely resolved with initial treatment.  Does not seem to have COPD exacerbation now. -Patient received 125 mg of Solu-Medrol in ED, will not continue steroid -Bronchodilators, as needed Mucinex

## 2023-07-02 NOTE — ED Provider Notes (Signed)
[facility] Provider Note    Event Date/Time   First MD Initiated Contact with Patient 07/02/23 1502     (approximate)   History   Shortness of Breath   HPI  Kenneth Benson is a 62 y.o. male history of COPD paroxysmal A-fib, congestive heart failure morbid obesity previous MI  For 1 week he has had progressively increasing shortness of breath with wheezing.  Reports has been using his albuterol numerous times.  He ran out of one of his heart pills he believes it is Cardizem.  He is still taking his blood thinner, but also ran out of his nebulizer solutions.  No chest pain reports his lots of wheezing slight dry cough no fevers or chills.  Still eating and drinking well.  No swelling or weight gain that he knows of  Reports has been ongoing for about a week     Physical Exam   Triage Vital Signs: ED Triage Vitals  Encounter Vitals Group     BP 07/02/23 1432 (!) 137/97     Systolic BP Percentile --      Diastolic BP Percentile --      Pulse Rate 07/02/23 1432 (!) 59     Resp 07/02/23 1432 (!) 26     Temp 07/02/23 1432 98.3 F (36.8 C)     Temp src --      SpO2 07/02/23 1432 91 %     Weight 07/02/23 1434 270 lb (122.5 kg)     Height 07/02/23 1434 6' (1.829 m)     Head Circumference --      Peak Flow --      Pain Score 07/02/23 1433 9     Pain Loc --      Pain Education --      Exclude from Growth Chart --    On my evaluation patient has good elastography reading demonstrates an oxygen saturation 85% with irregularity of pulse consistent with A-fib with mild RVR.  Placed patient on 2 L oxygen   Most recent vital signs: Vitals:   07/02/23 2148 07/02/23 2155  BP:  118/83  Pulse:  (!) 114  Resp: (!) 27 (!) 24  Temp:    SpO2: 92% 93%   Heart rate of 45 as documented is erroneous, patient is having heart rate of approximately 100-120 by pulse  General: Awake, dyspneic with mild tachypnea speaks in phrases.  Oxygen saturation 85% while  speaking CV:  Good peripheral perfusion.  Moderate tachycardia irregular Resp: Moderate tachypnea, wheezing, sitting upright with accessory muscle use.  No tripoding or obvious severe distress however Abd:  No distention.  Other:  No significant lower extremity except tenderness bilateral.  No calf or thigh tenderness   ED Results / Procedures / Treatments   Labs (all labs ordered are listed, but only abnormal results are displayed) Labs Reviewed  BASIC METABOLIC PANEL - Abnormal; Notable for the following components:      Result Value   Chloride 97 (*)    Glucose, Bld 146 (*)    BUN 23 (*)    All other components within normal limits  CBC - Abnormal; Notable for the following components:   RDW 18.6 (*)    All other components within normal limits  BRAIN NATRIURETIC PEPTIDE - Abnormal; Notable for the following components:   B Natriuretic Peptide 661.0 (*)    All other components within normal limits  BLOOD GAS, VENOUS - Abnormal; Notable for the following components:  pCO2, Ven 88 (*)    Bicarbonate 43.3 (*)    Acid-Base Excess 12.8 (*)    All other components within normal limits  RESP PANEL BY RT-PCR (FLU A&B, COVID) ARPGX2  PROCALCITONIN  HEPATIC FUNCTION PANEL  TROPONIN I (HIGH SENSITIVITY)  TROPONIN I (HIGH SENSITIVITY)   Labs interpreted as normal CBC, primarily normal metabolic panel, initial troponin normal.  EKG  And interpreted by me at 3 PM Heart rate 130 QRS 90 QTc 480 Atrial fibrillation rapid ventricular response nonspecific T wave abnormality   RADIOLOGY Chest x-ray interpreted by me as probable atelectasis, no obvious pulmonary edema or infiltrate      PROCEDURES:  Critical Care performed: Yes, see critical care procedure note(s)  CRITICAL CARE Performed by: Sharyn Creamer   Total critical care time: 30 minutes  Critical care time was exclusive of separately billable procedures and treating other patients.  Critical care was necessary to  treat or prevent imminent or life-threatening deterioration.  Critical care was time spent personally by me on the following activities: development of treatment plan with patient and/or surrogate as well as nursing, discussions with consultants, evaluation of patient's response to treatment, examination of patient, obtaining history from patient or surrogate, ordering and performing treatments and interventions, ordering and review of laboratory studies, ordering and review of radiographic studies, pulse oximetry and re-evaluation of patient's condition.  Patient with hypoxia, requiring multiple nebulizer treatments, felt to having moderate to severe exacerbation of underlying COPD as well as A-fib RVR.  Procedures   MEDICATIONS ORDERED IN ED: Medications  apixaban (ELIQUIS) tablet 5 mg (5 mg Oral Given 07/02/23 2133)  methylPREDNISolone sodium succinate (SOLU-MEDROL) 125 mg/2 mL injection 60 mg (60 mg Intravenous Given 07/02/23 2002)    Followed by  predniSONE (DELTASONE) tablet 40 mg (has no administration in time range)  ipratropium-albuterol (DUONEB) 0.5-2.5 (3) MG/3ML nebulizer solution 3 mL (3 mLs Nebulization Given 07/02/23 2012)  sodium chloride flush (NS) 0.9 % injection 3 mL (3 mLs Intravenous Given 07/02/23 2133)  acetaminophen (TYLENOL) tablet 650 mg (has no administration in time range)    Or  acetaminophen (TYLENOL) suppository 650 mg (has no administration in time range)  HYDROcodone-acetaminophen (NORCO/VICODIN) 5-325 MG per tablet 1-2 tablet (has no administration in time range)  morphine (PF) 2 MG/ML injection 2 mg (has no administration in time range)  docusate sodium (COLACE) capsule 100 mg (100 mg Oral Given 07/02/23 2133)  polyethylene glycol (MIRALAX / GLYCOLAX) packet 17 g (has no administration in time range)  bisacodyl (DULCOLAX) EC tablet 5 mg (has no administration in time range)  ondansetron (ZOFRAN) tablet 4 mg (has no administration in time range)    Or  ondansetron  (ZOFRAN) injection 4 mg (has no administration in time range)  guaiFENesin (MUCINEX) 12 hr tablet 600 mg (has no administration in time range)  nicotine (NICODERM CQ - dosed in mg/24 hours) patch 14 mg (14 mg Transdermal Patch Applied 07/02/23 2001)  hydrALAZINE (APRESOLINE) injection 5 mg (has no administration in time range)  albuterol (PROVENTIL) (2.5 MG/3ML) 0.083% nebulizer solution 2.5 mg (has no administration in time range)  diltiazem (CARDIZEM) 125 mg in dextrose 5% 125 mL (1 mg/mL) infusion (10 mg/hr Intravenous Rate/Dose Change 07/02/23 2032)  levalbuterol (XOPENEX) nebulizer solution 2.5 mg (2.5 mg Nebulization Given 07/02/23 1557)  sodium chloride 0.9 % bolus 500 mL (0 mLs Intravenous Stopped 07/02/23 1728)  methylPREDNISolone sodium succinate (SOLU-MEDROL) 125 mg/2 mL injection 125 mg (125 mg Intravenous Given 07/02/23 1557)  sodium chloride  0.9 % bolus 500 mL (0 mLs Intravenous Stopped 07/02/23 1952)  levalbuterol (XOPENEX) nebulizer solution 2.5 mg (2.5 mg Nebulization Given 07/02/23 1821)  diltiazem (CARDIZEM) injection 2.5 mg (2.5 mg Intravenous Given 07/02/23 1954)  iohexol (OMNIPAQUE) 350 MG/ML injection 100 mL (100 mLs Intravenous Contrast Given 07/02/23 2052)     IMPRESSION / MDM / ASSESSMENT AND PLAN / ED COURSE  I reviewed the triage vital signs and the nursing notes.                              Differential diagnosis includes, but is not limited to, COPD, pneumonia, emphysema, reactive airway disease, no clear evidence of acute ACS no associated chest pain, he does have atrial fibrillation with RVR but I suspect that this is likely somewhat related to medication compliance but also reactive to his acute respiratory distress.  He has a history of known COPD will treat aggressively with nebulizer treatment steroids.  Procalcitonin is normal, no evidence of infection on chest x-ray.  No associated chest pain or pleuritic discomfort and symptoms are slowly worsening over weeks time  unlikely represent acute thromboembolic phenomena  Patient's presentation is most consistent with acute presentation with potential threat to life or bodily function.   The patient is on the cardiac monitor to evaluate for evidence of arrhythmia and/or significant heart rate changes.  Hypotension improving after receiving fluids, suspect patient was likely mildly hypovolemic responding well to fluids.  Consulted with and patient accepted to hospitalist service by Dr. Renaldo Reel      FINAL CLINICAL IMPRESSION(S) / ED DIAGNOSES   Final diagnoses:  COPD exacerbation (HCC)  Hypoxia     Rx / DC Orders   ED Discharge Orders     None        Note:  This document was prepared using Dragon voice recognition software and may include unintentional dictation errors.   Sharyn Creamer, MD 07/02/23 [phone]

## 2023-07-02 NOTE — Assessment & Plan Note (Signed)
-  Nicotine patch 

## 2023-07-02 NOTE — Assessment & Plan Note (Addendum)
Vitals:   07/02/23 1432 07/02/23 1502 07/02/23 1530 07/02/23 1721  BP: (!) 137/97 (!) 94/58 97/72 (!) 89/72   07/02/23 1800  BP: (!) 115/94  -IV hydralazine as needed -Metoprolol, Cardizem -hold Cozarr due to soft Bp -Patient is on IV Lasix

## 2023-07-02 NOTE — ED Triage Notes (Addendum)
Pt states being out of medication for 2 weeks. Pt states history of a-fib. Audible wheezing noted. Pt states shortness of breath for the last week, but has been getting worse.    Pt states he does not use oxygen at home and normal oxygen is 88-94% on room air.   Blue top and grey top sent to lab as save labels

## 2023-07-02 NOTE — H&P (Signed)
History and Physical    Kenneth Benson UJW:[id number] DOB: January 18, 1961 DOA: 07/02/2023  Referring MD/NP/PA:  PCP: Pcp, No   Patient coming from:  Home   Chief Complaint: Shortness of breath  HPI: [name] is a 62 y.o. male with medical history significant of CHF with EF of 45-50%, hypertension, COPD/asthma, atrial fibrillation on Eliquis, obesity, tobacco abuse, who presents with shortness of breath.Patient states that he has shortness of breath which has been progressively worsening over past 2 weeks. He has mild bilateral leg edema.  Patient is not using oxygen normally, found to have oxygen saturation 91% on room air, which improved to 99% on 2 L oxygen.  Patient states that he has not been taking his diltiazem for the past few weeks as he has run out.  HPI is limited as patient is tachycardic in A-fib RVR movement and speaking heart rate is elevated.  Patient does continue to smoke.  Denies any chest pain currently. In the emergency room: Patient is alert awake oriented mild distress due to tachycardia.  Vitals show bradycardia initially with respirations of 26 and normal blood pressure which has eventually changed to heart rate of 144 A-fib RVR respirations of 26 blood pressure of 103/88 O2 sats of 95% on 3 L. Venous blood gas-ordered and pending. BMP shows glucose 146 normal kidney function. Hepatic function test normal. BNP of 661/troponin of 14/repeat troponin of 13. Procalcitonin of less than 10. CBC within normal limits 15.8 normal white count normal platelet count. Respiratory panel is negative for COVID and influenza.  EKG: I have personally reviewed.    Review of Systems  Respiratory:  Positive for cough and shortness of breath.   Cardiovascular:  Positive for leg swelling.   Allergy: No Known Allergies  Past Medical History:  Diagnosis Date   A-fib (HCC) 02/06/2022   AKI (acute kidney injury) (HCC) 12/17/2022   Arrhythmia    atrial fibrillation   Asthma    CHF  (congestive heart failure) (HCC)    COPD (chronic obstructive pulmonary disease) (HCC)    Hyperkalemia 12/17/2022   Hypokalemia 04/13/2023   Myocardial injury 04/13/2023   Tobacco abuse    Social History:  reports that he has been smoking cigarettes. He has a 40.5 pack-year smoking history. He does not have any smokeless tobacco history on file. He reports that he does not drink alcohol and does not use drugs.  Family History:  Family History  Problem Relation Age of Onset   Heart disease Mother    Atrial fibrillation Father      Prior to Admission medications   Medication Sig Start Date End Date Taking? Authorizing Provider  albuterol (PROVENTIL) (2.5 MG/3ML) 0.083% nebulizer solution Take 3 mLs (2.5 mg total) by nebulization every 6 (six) hours as needed for wheezing or shortness of breath. 02/15/23   Erin Fulling, MD  albuterol (VENTOLIN HFA) 108 (90 Base) MCG/ACT inhaler Inhale 2 puffs into the lungs every 6 (six) hours as needed for wheezing or shortness of breath. 01/23/23   Delma Freeze, FNP  apixaban (ELIQUIS) 5 MG TABS tablet Take 1 tablet (5 mg total) by mouth 2 (two) times daily. 12/29/22   Delma Freeze, FNP  arformoterol (BROVANA) 15 MCG/2ML NEBU Take 2 mLs (15 mcg total) by nebulization 2 (two) times daily. 02/15/23   Erin Fulling, MD  budesonide (PULMICORT) 0.5 MG/2ML nebulizer solution Take 2 mLs (0.5 mg total) by nebulization 2 (two) times daily. 02/15/23 02/15/24  Erin Fulling, MD  diltiazem (CARDIZEM CD) 360 MG 24 hr capsule Take 1 capsule (360 mg total) by mouth daily. 12/29/22   Delma Freeze, FNP  empagliflozin (JARDIANCE) 10 MG TABS tablet Take 1 tablet (10 mg total) by mouth daily. 12/29/22   Delma Freeze, FNP  losartan (COZAAR) 25 MG tablet Take 0.5 tablets (12.5 mg total) by mouth daily. 01/23/23   Delma Freeze, FNP  metoprolol tartrate (LOPRESSOR) 50 MG tablet Take 1 tablet (50 mg total) by mouth 2 (two) times daily. 12/29/22   Delma Freeze, FNP   mometasone-formoterol (DULERA) 200-5 MCG/ACT AERO Inhale 2 puffs into the lungs 2 (two) times daily. 12/24/22   Marrion Coy, MD  torsemide (DEMADEX) 20 MG tablet Take 2 tablets (40 mg total) by mouth daily. 12/29/22   Delma Freeze, FNP  amiodarone (PACERONE) 200 MG tablet Take 1 tablet (200 mg total) by mouth 2 (two) times daily. 02/11/22 03/15/22  Lewie Chamber, MD    Physical Exam: Vitals:   07/03/23 0730 07/03/23 0736 07/03/23 0800 07/03/23 0900  BP: 100/62  104/74 112/69  Pulse:  (!) 122 60 (!) 124  Resp:  15 (!) 29 20  Temp:    98.3 F (36.8 C)  TempSrc:    Oral  SpO2:  94% 90% 94%  Weight:      Height:       Physical Exam Vitals and nursing note reviewed.  Constitutional:      General: He is not in acute distress. HENT:     Head: Normocephalic and atraumatic.     Right Ear: Hearing normal.     Left Ear: Hearing normal.     Nose: Nose normal. No nasal deformity.     Mouth/Throat:     Lips: Pink.     Tongue: No lesions.     Pharynx: Oropharynx is clear.  Eyes:     General: Lids are normal.     Extraocular Movements: Extraocular movements intact.  Cardiovascular:     Rate and Rhythm: Tachycardia present. Rhythm irregular.     Heart sounds: Normal heart sounds.  Pulmonary:     Effort: Pulmonary effort is normal.     Breath sounds: Examination of the right-middle field reveals wheezing. Examination of the left-middle field reveals wheezing. Examination of the right-lower field reveals wheezing. Examination of the left-lower field reveals wheezing. Wheezing present.  Abdominal:     General: Bowel sounds are normal. There is no distension.     Palpations: Abdomen is soft. There is no mass.     Tenderness: There is no abdominal tenderness.  Musculoskeletal:     Right lower leg: Edema present.     Left lower leg: Edema present.     Comments: Trace pedal edema  Skin:    General: Skin is warm.  Neurological:     General: No focal deficit present.     Mental Status: He  is alert and oriented to person, place, and time.     Cranial Nerves: Cranial nerves 2-12 are intact.  Psychiatric:        Attention and Perception: Attention normal.        Mood and Affect: Mood normal.        Speech: Speech normal.        Behavior: Behavior normal. Behavior is cooperative.      CBC: Recent Labs  Lab 07/02/23 1436 07/03/23 0936  WBC 6.8 13.3*  HGB 15.8 15.0  HCT 50.6 48.0  MCV 88.9 89.1  PLT  252 238   Basic Metabolic Panel: Recent Labs  Lab 07/02/23 1436  NA 141  K 3.7  CL 97*  CO2 31  GLUCOSE 146*  BUN 23*  CREATININE 1.19  CALCIUM 9.0   GFR: Estimated Creatinine Clearance: 89.3 mL/min (by C-G formula based on SCr of 1.19 mg/dL).   Recent Results (from the past 240 hour(s))  Resp Panel by RT-PCR (Flu A&B, Covid) Anterior Nasal Swab     Status: None   Collection Time: 07/02/23  4:03 PM   Specimen: Anterior Nasal Swab  Result Value Ref Range Status   SARS Coronavirus 2 by RT PCR NEGATIVE NEGATIVE Final    Comment: (NOTE) SARS-CoV-2 target nucleic acids are NOT DETECTED.  The SARS-CoV-2 RNA is generally detectable in upper respiratory specimens during the acute phase of infection. The lowest concentration of SARS-CoV-2 viral copies this assay can detect is 138 copies/mL. A negative result does not preclude SARS-Cov-2 infection and should not be used as the sole basis for treatment or other patient management decisions. A negative result may occur with  improper specimen collection/handling, submission of specimen other than nasopharyngeal swab, presence of viral mutation(s) within the areas targeted by this assay, and inadequate number of viral copies(<138 copies/mL). A negative result must be combined with clinical observations, patient history, and epidemiological information. The expected result is Negative.  Fact Sheet for Patients:  BloggerCourse.com  Fact Sheet for Healthcare Providers:   SeriousBroker.it  This test is no t yet approved or cleared by the [place] FDA and  has been authorized for detection and/or diagnosis of SARS-CoV-2 by FDA under an Emergency Use Authorization (EUA). This EUA will remain  in effect (meaning this test can be used) for the duration of the COVID-19 declaration under Section 564(b)(1) of the Act, 21 U.S.C.section 360bbb-3(b)(1), unless the authorization is terminated  or revoked sooner.       Influenza A by PCR NEGATIVE NEGATIVE Final   Influenza B by PCR NEGATIVE NEGATIVE Final    Comment: (NOTE) The Xpert Xpress SARS-CoV-2/FLU/RSV plus assay is intended as an aid in the diagnosis of influenza from Nasopharyngeal swab specimens and should not be used as a sole basis for treatment. Nasal washings and aspirates are unacceptable for Xpert Xpress SARS-CoV-2/FLU/RSV testing.  Fact Sheet for Patients: BloggerCourse.com  Fact Sheet for Healthcare Providers: SeriousBroker.it  This test is not yet approved or cleared by the [place] FDA and has been authorized for detection and/or diagnosis of SARS-CoV-2 by FDA under an Emergency Use Authorization (EUA). This EUA will remain in effect (meaning this test can be used) for the duration of the COVID-19 declaration under Section 564(b)(1) of the Act, 21 U.S.C. section 360bbb-3(b)(1), unless the authorization is terminated or revoked.  Performed at [facility], [address], [place], [place] [address]      Radiological Exams on Admission: CT Angio Chest Pulmonary Embolism (PE) W or WO Contrast  Result Date: 07/02/2023 CLINICAL DATA:  Audible wheezing. EXAM: CT ANGIOGRAPHY CHEST WITH CONTRAST TECHNIQUE: Multidetector CT imaging of the chest was performed using the standard protocol during bolus administration of intravenous contrast. Multiplanar CT image reconstructions and MIPs were obtained to  evaluate the vascular anatomy. RADIATION DOSE REDUCTION: This exam was performed according to the departmental dose-optimization program which includes automated exposure control, adjustment of the mA and/or kV according to patient size and/or use of iterative reconstruction technique. CONTRAST:  OMNIPAQUE IOHEXOL 350 MG/ML SOLN COMPARISON:  Apr 13, 2023 FINDINGS: Cardiovascular: Thoracic  aorta is normal in appearance. Satisfactory opacification of the pulmonary arteries to the segmental level. No evidence of pulmonary embolism. There is mild cardiomegaly with mild coronary artery calcification. No pericardial effusion. Mediastinum/Nodes: No enlarged mediastinal, hilar, or axillary lymph nodes. Thyroid gland, trachea, and esophagus demonstrate no significant findings. Lungs/Pleura: Mild lingular, right middle lobe and bibasilar atelectasis is seen. A stable 4 mm left upper lobe lung nodule is noted. There is no evidence of acute infiltrate, pleural effusion or pneumothorax. Upper Abdomen: A punctate gallstone is seen within an otherwise normal-appearing gallbladder. Musculoskeletal: No chest wall abnormality. No acute or significant osseous findings. Review of the MIP images confirms the above findings. IMPRESSION: 1. No evidence of pulmonary embolism. 2. Mild lingular, right middle lobe and bibasilar atelectasis. 3. Stable 4 mm left upper lobe pulmonary nodule. No follow-up needed if patient is low-risk.This recommendation follows the consensus statement: Guidelines for Management of Incidental Pulmonary Nodules Detected on CT Images: From the Fleischner Society 2017; Radiology 2017; 284:228-243. 4. Cholelithiasis. Electronically Signed   By: Aram Candela M.D.   On: 07/02/2023 21:16   DG Chest 2 View  Result Date: 07/02/2023 CLINICAL DATA:  Shortness of breath. EXAM: CHEST - 2 VIEW COMPARISON:  Chest x-ray dated Apr 16, 2023. FINDINGS: Unchanged cardiomegaly. Normal pulmonary vascularity. Mild streaky  opacities at both lung bases consistent with atelectasis. No focal consolidation, pleural effusion, or pneumothorax. No acute osseous abnormality. IMPRESSION: Mild bibasilar atelectasis. Electronically Signed   By: Obie Dredge M.D.   On: 07/02/2023 15:00     Acute hypoxic respiratory failure (HCC) SpO2: 99 % O2 Flow Rate (L/min): 2 L/min 2/2 CHF vs COPD exacerbation.   Acute on chronic combined systolic and diastolic CHF (congestive heart failure) (HCC) 2D echo 12/18/2022 showed EF of 45-50%.  Patient has 2+ leg edema, positive JVD, fine crackles on auscultation, significantly elevated BNP 661 clinically consistent with CHF exacerbation + LE edema/ rales  -Will admit to  progressive as inpatient. -Lasix 20 mg bid by IV -Daily weights -strict I/O's -Low salt diet -Fluid restriction 1000 ml daily.  -As needed supplemental oxygen /bronchodilators for shortness of breath. We will hold torsemide and losartan/ metoprolol and cont low dose diltiazem to allow room for diuresis.   Paroxysmal atrial fibrillation with RVR (HCC) Diltiazem IV PRN. Diltiazem infusion.  Continue eliquis    Asthma: Based on PFT in march 2024 Per ED physician, patient had wheezing initially, but no wheezing on my examination, likely resolved with initial treatment.  Does not seem to have COPD exacerbation now. -Patient received 125 mg of Solu-Medrol in ED, will not continue steroid -Bronchodilators, as needed Mucinex.  HTN (hypertension) Vitals:   07/03/23 0730 07/03/23 0736 07/03/23 0800 07/03/23 0900  BP: 100/62  104/74 112/69  Pulse:  (!) 122 60 (!) 124  Temp:    98.3 F (36.8 C)  Resp:  15 (!) 29 20  Height:      Weight:      SpO2:  94% 90% 94%  TempSrc:    Oral  BMI (Calculated):      Diltiazem infusion currently.   Cigarette smoker Nicotine patch.   DVT ppx: on Eliquis Code Status: Full code Family Communication:none  Disposition Plan:  Anticipate discharge back to previous  environment Consults called:  none Admission status and Level of care: Progressive:  as inpt    Dispo: The patient is from: Home              Anticipated d/c is  to: Home              Anticipated d/c date is: 2 days              Patient currently is not medically stable to d/c.    Severity of Illness:  The appropriate patient status for this patient is INPATIENT. Inpatient status is judged to be reasonable and necessary in order to provide the required intensity of service to ensure the patient's safety. The patient's presenting symptoms, physical exam findings, and initial radiographic and laboratory data in the context of their chronic comorbidities is felt to place them at high risk for further clinical deterioration. Furthermore, it is not anticipated that the patient will be medically stable for discharge from the hospital within 2 midnights of admission.   * I certify that at the point of admission it is my clinical judgment that the patient will require inpatient hospital care spanning beyond 2 midnights from the point of admission due to high intensity of service, high risk for further deterioration and high frequency of surveillance required.*   Date of Service 07/03/2023  Gertha Calkin [facility]   If 7PM-7AM, please contact night-coverage [email or web address] 07/03/2023, 10:04 AM

## 2023-07-02 NOTE — Assessment & Plan Note (Addendum)
SpO2: 99 % O2 Flow Rate (L/min): 2 L/min 2/2 CHF vs COPD exacerbation.

## 2023-07-02 NOTE — Assessment & Plan Note (Addendum)
Diltiazem IV PRN. Diltiazem infusion.

## 2023-07-02 NOTE — Assessment & Plan Note (Addendum)
2D echo 12/18/2022 showed EF of 45-50%.  Patient has trace pedal  edema, positive JVD, fine crackles on auscultation, significantly elevated BNP 661 clinically consistent with  mild CHF exacerbation trace LE edema/ rales  -Will admit to  progressive as inpatient. -Daily weights -strict I/O's -Low salt diet -Fluid restriction 1000 ml daily.  -As needed supplemental oxygen /bronchodilators for shortness of breath. We will hold torsemide and losartan/ metoprolol and cont low dose diltiazem to allow room for diuresis.

## 2023-07-03 ENCOUNTER — Other Ambulatory Visit: Payer: Self-pay

## 2023-07-03 ENCOUNTER — Encounter: Payer: Self-pay | Admitting: Internal Medicine

## 2023-07-03 DIAGNOSIS — J9601 Acute respiratory failure with hypoxia: Secondary | ICD-10-CM | POA: Diagnosis not present

## 2023-07-03 LAB — BASIC METABOLIC PANEL
Anion gap: 12 (ref 5–15)
BUN: 29 mg/dL — ABNORMAL HIGH (ref 6–20)
CO2: 30 mmol/L (ref 22–32)
Calcium: 8.8 mg/dL — ABNORMAL LOW (ref 8.9–10.3)
Chloride: 95 mmol/L — ABNORMAL LOW (ref 98–111)
Creatinine, Ser: 1.16 mg/dL (ref 0.61–1.24)
GFR, Estimated: >60 mL/min (ref >60)
Glucose, Bld: 176 mg/dL — ABNORMAL HIGH (ref 70–99)
Potassium: 3.9 mmol/L (ref 3.5–5.1)
Sodium: 137 mmol/L (ref 135–145)

## 2023-07-03 LAB — CBC
HCT: 48 % (ref 39.0–52.0)
Hemoglobin: 15 g/dL (ref 13.0–17.0)
MCH: 27.8 pg (ref 26.0–34.0)
MCHC: 31.3 g/dL (ref 30.0–36.0)
MCV: 89.1 fL (ref 80.0–100.0)
Platelets: 238 10*3/uL (ref 150–400)
RBC: 5.39 MIL/uL (ref 4.22–5.81)
RDW: 18 % — ABNORMAL HIGH (ref 11.5–15.5)
WBC: 13.3 10*3/uL — ABNORMAL HIGH (ref 4.0–10.5)
nRBC: 0 % (ref 0.0–0.2)

## 2023-07-03 LAB — MAGNESIUM: Magnesium: 2 mg/dL (ref 1.7–2.4)

## 2023-07-03 MED ORDER — IPRATROPIUM BROMIDE 0.02 % IN SOLN
0.5000 mg | Freq: Four times a day (QID) | RESPIRATORY_TRACT | Status: DC
  Administered 2023-07-03 – 2023-07-10 (×28): 0.5 mg via RESPIRATORY_TRACT
  Filled 2023-07-03 (×28): qty 2.5

## 2023-07-03 MED ORDER — EMPAGLIFLOZIN 10 MG PO TABS
10.0000 mg | ORAL_TABLET | Freq: Every day | ORAL | Status: DC
  Administered 2023-07-03 – 2023-07-10 (×8): 10 mg via ORAL
  Filled 2023-07-03 (×8): qty 1

## 2023-07-03 MED ORDER — TORSEMIDE 20 MG PO TABS
40.0000 mg | ORAL_TABLET | Freq: Every day | ORAL | Status: DC
  Administered 2023-07-04 – 2023-07-10 (×7): 40 mg via ORAL
  Filled 2023-07-03 (×7): qty 2

## 2023-07-03 MED ORDER — AMIODARONE HCL IN DEXTROSE 360-4.14 MG/200ML-% IV SOLN
60.0000 mg/h | INTRAVENOUS | Status: DC
  Administered 2023-07-03 (×2): 60 mg/h via INTRAVENOUS
  Filled 2023-07-03 (×2): qty 200

## 2023-07-03 MED ORDER — POTASSIUM CHLORIDE CRYS ER 20 MEQ PO TBCR
40.0000 meq | EXTENDED_RELEASE_TABLET | Freq: Once | ORAL | Status: AC
  Administered 2023-07-03: 40 meq via ORAL
  Filled 2023-07-03: qty 2

## 2023-07-03 MED ORDER — AMIODARONE LOAD VIA INFUSION
150.0000 mg | Freq: Once | INTRAVENOUS | Status: AC
  Administered 2023-07-03: 150 mg via INTRAVENOUS
  Filled 2023-07-03: qty 83.34

## 2023-07-03 MED ORDER — FUROSEMIDE 10 MG/ML IJ SOLN
60.0000 mg | Freq: Once | INTRAMUSCULAR | Status: AC
  Administered 2023-07-03: 60 mg via INTRAVENOUS
  Filled 2023-07-03: qty 6

## 2023-07-03 MED ORDER — LEVALBUTEROL HCL 0.63 MG/3ML IN NEBU
0.6300 mg | INHALATION_SOLUTION | Freq: Four times a day (QID) | RESPIRATORY_TRACT | Status: DC
  Administered 2023-07-03 – 2023-07-10 (×28): 0.63 mg via RESPIRATORY_TRACT
  Filled 2023-07-03 (×28): qty 3

## 2023-07-03 MED ORDER — METHYLPREDNISOLONE SODIUM SUCC 40 MG IJ SOLR
40.0000 mg | Freq: Two times a day (BID) | INTRAMUSCULAR | Status: DC
  Administered 2023-07-03 – 2023-07-04 (×2): 40 mg via INTRAVENOUS
  Filled 2023-07-03 (×2): qty 1

## 2023-07-03 MED ORDER — METOPROLOL TARTRATE 25 MG PO TABS
12.5000 mg | ORAL_TABLET | Freq: Four times a day (QID) | ORAL | Status: DC
  Administered 2023-07-03 – 2023-07-04 (×4): 12.5 mg via ORAL
  Filled 2023-07-03 (×4): qty 1

## 2023-07-03 MED ORDER — LOSARTAN POTASSIUM 25 MG PO TABS
12.5000 mg | ORAL_TABLET | Freq: Every day | ORAL | Status: DC
  Administered 2023-07-03: 12.5 mg via ORAL
  Filled 2023-07-03: qty 1

## 2023-07-03 MED ORDER — AMIODARONE HCL IN DEXTROSE 360-4.14 MG/200ML-% IV SOLN
30.0000 mg/h | INTRAVENOUS | Status: DC
  Administered 2023-07-03 – 2023-07-04 (×4): 30 mg/h via INTRAVENOUS
  Filled 2023-07-03 (×4): qty 200

## 2023-07-03 NOTE — TOC Initial Note (Signed)
Transition of Care [facility]) - Initial/Assessment Note    Patient Details  Name: Kenneth Benson MRN: [id number] Date of Birth: 05-31-1961  Transition of Care [facility]) CM/SW Contact:    Kenneth Shropshire, RN Phone Number: 07/03/2023, 12:31 PM  Clinical Narrative:                  Pt arrived from ED from: Home Caregiver Support: wife DME at Home: None Transportation: Family Previous Services: None HH/SNF Preference: None First Person of Contact: Significant other Kenneth Benson PCP: None will add list to AVS   Cm will continue to follow for toc needs and d/c planning.    Barriers to Discharge: Continued Medical Work up   Patient Goals and CMS Choice   CMS Medicare.gov Compare Post Acute Care list provided to:: Patient Choice offered to / list presented to : Patient      Expected Discharge Plan and Services     Post Acute Care Choice: Home Health, Skilled Nursing Facility Living arrangements for the past 2 months: Single Family Home                                      Prior Living Arrangements/Services Living arrangements for the past 2 months: Single Family Home Lives with:: Self, Spouse          Need for Family Participation in Patient Care: Yes (Comment) Care giver support system in place?: Yes (comment)      Activities of Daily Living Home Assistive Devices/Equipment: Nebulizer ADL Screening (condition at time of admission) Patient's cognitive ability adequate to safely complete daily activities?: Yes Is the patient deaf or have difficulty hearing?: No Does the patient have difficulty seeing, even when wearing glasses/contacts?: No Does the patient have difficulty concentrating, remembering, or making decisions?: No Patient able to express need for assistance with ADLs?: Yes Does the patient have difficulty dressing or bathing?: No Independently performs ADLs?: Yes (appropriate for developmental age) Does the patient have difficulty walking or climbing stairs?:  No Weakness of Legs: None Weakness of Arms/Hands: None  Permission Sought/Granted                  Emotional Assessment   Attitude/Demeanor/Rapport: Engaged Affect (typically observed): Calm Orientation: : Oriented to Self, Oriented to Place, Oriented to  Time, Oriented to Situation      Admission diagnosis:  SOB (shortness of breath) [R06.02] Patient Active Problem List   Diagnosis Date Noted   SOB (shortness of breath) 07/02/2023   HTN (hypertension) 04/13/2023   Lung nodule 04/13/2023   Obesity (BMI 30-39.9) 04/13/2023   Asthmatic bronchitis , chronic 02/12/2023   Moderate tricuspid regurgitation 12/21/2022   Paroxysmal atrial fibrillation with RVR (HCC) 12/16/2022   Acute on chronic combined systolic and diastolic CHF (congestive heart failure) (HCC) 12/16/2022   Physical deconditioning 02/07/2022   Dysphagia 02/06/2022   COPD (chronic obstructive pulmonary disease) (HCC) 01/21/2022   Asthma exacerbation 01/21/2022   Cigarette smoker 01/21/2022   Acute hypoxic respiratory failure (HCC)    PCP:  Pcp, No Pharmacy:   [facility] [address] [place] [address] Phone: [phone] Fax: [phone]     Social Determinants of Health (SDOH) Social History: SDOH Screenings   Food Insecurity: No Food Insecurity (07/03/2023)  Housing: Low Risk  (07/03/2023)  Transportation Needs: No Transportation Needs (07/03/2023)  Utilities: Not At Risk (07/03/2023)  Tobacco  Use: High Risk (07/03/2023)   SDOH Interventions:     Readmission Risk Interventions     No data to display

## 2023-07-03 NOTE — ED Notes (Signed)
Pt having frequent runs of PVCs. Hospitalist informed. RT called to inform pt is off bipap.

## 2023-07-03 NOTE — Progress Notes (Signed)
PT Cancellation Note  Patient Details Name: THAREN CARFAGNO MRN: [id number] DOB: 09-05-1961   Cancelled Treatment:    Reason Eval/Treat Not Completed: Medical issues which prohibited therapy.  Care team working to better control HR with medication. Per therapy protocol, new amiodarone IV is contraindicated for therapy at this time. Will re-attempt PT evaluation following 24hrs after initiation and pending rate/rhythm normalized and controlled.   Olga Coaster PT, DPT 2:16 PM,07/03/23

## 2023-07-03 NOTE — Progress Notes (Addendum)
Nutrition Brief Note  RD consulted for assessment of nutrition needs/ status.   Wt Readings from Last 15 Encounters:  07/02/23 122.5 kg  05/11/23 120.1 kg  04/17/23 122 kg  02/15/23 117.1 kg  01/23/23 116.3 kg  01/06/23 112.6 kg  12/29/22 111.6 kg  12/18/22 126.3 kg  02/25/22 96.7 kg  02/13/22 96.7 kg  10/01/15 122.5 kg  01/26/13 117.9 kg   Pt with medical history significant of CHF with EF of 45-50%, hypertension, COPD/asthma, atrial fibrillation on Eliquis, obesity, tobacco abuse, who presents with shortness of breath  Pt admitted with CHF exacerbation.   Reviewed I/O's: +500 ml x 24 hours   Spoke with pt at bedside, who was pleasant and in good spirits at time of visit. Pt reports feeling hungry and was eating a boxed lunch at time of visit. Pt reports concern over restrictions of diet.   Pt shares that he has a good appetite, but has been struggling financially due to inability to work. 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 He is trying to obtain disability. Pt shares that he often goes without his medications as he has to pick and choose which medications he gets to keep up with his bills. Pt shares that he has been without lasix for 3 weeks because of this. Pt lives with his significant other and denies food insecurity, as she has food stamp benefits and other food assistance programs she can access.   Per pt, he recently got his medicaid reinstated, but does not currently has a PCP. He is unable to weigh daily due to a malfunctioning scale- pt shares that his scale continued to malfunction when he replaced the batteries recently. He reports being compliant with daily weights when his scale was in working order.   Pt denies any weight loss. Pt endorses wt gain, which he attributes to being less active as he is unable to work. Reviewed wt hx; wt has been  stable over the past 6 months. Noted mild edema in lower extremities.   RD provided emotional support. Reviewed importance of CHF self-management when able to access resources he needs. RD placed consult to [facility] to investigate if there are further resources available to him.   Nutrition-Focused physical exam completed. Findings are no fat depletion, no muscle depletion, and mild edema.    Medications reviewed and include colace and solu-medrol.   Lab Results  Component Value Date   HGBA1C 6.0 (H) 04/13/2023   PTA DM medications are none.   Labs reviewed: CBGS: 155 (inpatient orders for glycemic control are none).    Body mass index is 36.62 kg/m. Patient meets criteria for obesity, class II based on current BMI. Obesity is a complex, chronic medical condition that is optimally managed by a multidisciplinary care team. Weight loss is not an ideal goal for an acute inpatient hospitalization. However, if further work-up for obesity is warranted, consider outpatient referral to 's Nutrition and Diabetes Education Services.    Current diet order is Heart Healthy/ carb modified (liberalized to 2 gram sodium), patient is consuming approximately 100% of meals at this time. Labs and medications reviewed.   No nutrition interventions warranted at this time. If nutrition issues arise, please consult RD.   Levada Schilling, RD, LDN, CDCES Registered Dietitian II Certified Diabetes Care and Education Specialist Please refer to [facility] for RD and/or RD on-call/weekend/after hours pager

## 2023-07-03 NOTE — TOC CM/SW Note (Signed)
Cm received consult for SNF/HH placement. Cm awaiting PT/OT evaluations for recommendations. Cm will continue to follow.

## 2023-07-03 NOTE — Progress Notes (Signed)
OT Cancellation Note  Patient Details Name: Kenneth Benson MRN: [id number] DOB: November 01, 1961   Cancelled Treatment:    Reason Eval/Treat Not Completed: Medical issues which prohibited therapy. Consult received, chart reviewed. Care team working to better control HR with medication. Per therapy protocol, new amiodarone IV is contraindicated for therapy at this time. Will re-attempt OT evaluation following 24hrs after initiation and pending rate/rhythm normalized and controlled.    Arman Filter., MPH, MS, OTR/L ascom [phone] 07/03/23, 2:02 PM

## 2023-07-03 NOTE — Progress Notes (Signed)
Progress Note   Patient: Kenneth Benson YQM:[id number] DOB: 24-Jan-1961 DOA: 07/02/2023     1 DOS: the patient was seen and examined on 07/03/2023   Brief hospital course: GIVEN Kenneth Benson is a 62 y.o. male with medical history significant of CHF with EF of 45-50%, hypertension, COPD/asthma, atrial fibrillation on Eliquis, obesity, tobacco abuse, who presents with shortness of breath.Patient states that he has shortness of breath which has been progressively worsening over past 2 weeks. He has mild bilateral leg edema.  Patient is not using oxygen normally, found to have oxygen saturation 91% on room air, which improved to 99% on 2 L oxygen.  Patient states that he has not been taking his diltiazem for the past few weeks as he has run out.  HPI is limited as patient is tachycardic in A-fib RVR movement and speaking heart rate is elevated.  Patient does continue to smoke.  Denies any chest pain currently. In the emergency room: Patient is alert awake oriented mild distress due to tachycardia.  Vitals show bradycardia initially with respirations of 26 and normal blood pressure which has eventually changed to heart rate of 144 A-fib RVR respirations of 26 blood pressure of 103/88 O2 sats of 95% on 3 L. Venous blood gas-ordered and pending. BMP shows glucose 146 normal kidney function. Hepatic function test normal. BNP of 661/troponin of 14/repeat troponin of 13. Procalcitonin of less than 10. CBC within normal limits 15.8 normal white count normal platelet count. Respiratory panel is negative for COVID and influenza.      Assessment and Plan:  Acute hypoxic respiratory failure Secondary to acute COPD exacerbation Patient presented to the ER for evaluation of worsening shortness of breath from his baseline and had room air pulse oximetry of 91% requiring 2 L of oxygen to maintain pulse oximetry greater than 92% We will attempt to wean off oxygen as tolerated    Acute COPD exacerbation Continue as  needed as well as scheduled bronchodilator therapy Continue systemic steroids Continue oxygen supplementation to maintain pulse oximetry greater than 92%    Paroxysmal A-fib with rapid ventricular rate Patient was on Cardizem infusion which was discontinued due to hypotension Will switch patient amiodarone infusion Consult cardiology    Chronic combined systolic and diastolic dysfunction CHF Stable and not acutely exacerbated Last 2D echocardiogram from 01/24 showed an LVEF of 45 to 50% Continue Jardiance, losartan, metoprolol and torsemide    Nicotine dependence Smoking cessation was discussed with patient's Continue nicotine transdermal patch    Obesity (BMI 36) Complicates overall prognosis and care Lifestyle modification and exercise has been discussed with patient in detail        Subjective: Patient is seen and examined at the bedside.  Continues to complain of shortness of breath  Physical Exam: Vitals:   07/03/23 1215 07/03/23 1245 07/03/23 1419 07/03/23 1431  BP: 95/85 101/78 115/71   Pulse: (!) 47 (!) 118    Resp:  (!) 22 20   Temp:   (!) 97.5 F (36.4 C)   TempSrc:      SpO2: 95% 92% 96% 95%  Weight:      Height:       Physical Exam Vitals and nursing note reviewed.  Constitutional:      Appearance: He is well-developed.  HENT:     Head: Normocephalic and atraumatic.  Eyes:     Pupils: Pupils are equal, round, and reactive to light.  Cardiovascular:     Rate and Rhythm: Tachycardia present. Rhythm  irregular.  Pulmonary:     Effort: Tachypnea present.     Breath sounds: Examination of the right-upper field reveals wheezing. Examination of the left-upper field reveals wheezing. Examination of the right-middle field reveals wheezing. Examination of the left-middle field reveals wheezing. Examination of the right-lower field reveals wheezing. Examination of the left-lower field reveals wheezing. Wheezing present.  Abdominal:     General: Bowel  sounds are normal.     Palpations: Abdomen is soft.  Musculoskeletal:        General: Normal range of motion.     Cervical back: Normal range of motion and neck supple.  Skin:    General: Skin is warm and dry.  Neurological:     General: No focal deficit present.     Mental Status: He is alert.  Psychiatric:        Mood and Affect: Mood normal.        Behavior: Behavior normal.     Data Reviewed: Labs reviewed.  Compensated hypercapnia There are no new results to review at this time.  Family Communication: Plan of care discussed with the patient at the bedside.  Disposition: Status is: Inpatient Remains inpatient appropriate because: On IV amiodarone for rate control  Planned Discharge Destination: Home    Time spent: 36 minutes  Author: Lucile Shutters, MD 07/03/2023 2:46 PM  For on call review [email or web address].

## 2023-07-03 NOTE — ED Notes (Signed)
Amiodarone rate to be changed to 30mg /hr at 1600.

## 2023-07-03 NOTE — ED Notes (Signed)
HR back to 120s and 130s with frequent PVCs, hospitalist Agbata informed. Pt denies chest pain, SOB. Currently wearing 3L oxygen per [place].

## 2023-07-03 NOTE — ED Notes (Signed)
BP on the monitor at 7am 78/59. 2nd BP taken a few minutes later was 87/57. Paused dilt drip per order. MD made aware. Manual BP taken 100/62. Per MD Observation for now.

## 2023-07-03 NOTE — Consult Note (Signed)
[facility] CLINIC CARDIOLOGY CONSULT NOTE       Patient ID: SHRAGA KOWAL MRN: [id number] DOB/AGE: [birth year] 61 y.o.  Admit date: 07/02/2023 Referring Physician Dr. Joylene Igo Primary Physician none Primary Cardiologist previous Dr. Beatrix Fetters  Reason for Consultation AF RVR, CHF  HPI:  Delmas Kalkowski. 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  Cardiology is consulted for further assistance.  The patient is well-known to our service from multiple prior admissions.  He states that over the past month he has had worsening dyspnea on exertion and occasional ankle swelling.  Unfortunately, he ran out of his diltiazem and a couple other cardiac medications and nebulizer solutions a couple weeks ago had to pick and choose which ones to refill based on what he could afford.  He thinks he has been taking his Eliquis, but not his diltiazem.  He has been very short of breath and wheezing, using his albuterol inhaler multiple times a day to help with his breathing. He also admits to a dry cough, no fever or chills no chest pain, heart racing or palpitations.  In the emergency department he was found to be in AF RVR with rate in the 140s yesterday evening, O2 sat in the low 90s on room air, BP borderline in the 100s over 80s he was given 125 mg of Solu-Medrol, and bronchodilators.  Also started on a diltiazem infusion which was discontinued this morning due to hypotension.  At my time of evaluation this morning the patient says he feels okay as long as he is sitting at rest and not moving around in terms of his dyspnea.  Denies chest pain, heart racing or palpitations, lightheadedness or dizziness.   Review of systems complete and found to be negative unless listed above     Past Medical History:  Diagnosis Date   A-fib (HCC) 02/06/2022   AKI (acute kidney injury) (HCC)  12/17/2022   Arrhythmia    atrial fibrillation   Asthma    CHF (congestive heart failure) (HCC)    COPD (chronic obstructive pulmonary disease) (HCC)    Hyperkalemia 12/17/2022   Hypokalemia 04/13/2023   Myocardial injury 04/13/2023   Tobacco abuse     History reviewed. No pertinent surgical history.  (Not in a hospital admission)  Social History   Socioeconomic History   Marital status: Single    Spouse name: Not on file   Number of children: Not on file   Years of education: Not on file   Highest education level: Not on file  Occupational History   Not on file  Tobacco Use   Smoking status: Every Day    Current packs/day: 1.50    Average packs/day: 1.5 packs/day for 27.0 years (40.5 ttl pk-yrs)    Types: Cigarettes   Smokeless tobacco: Not on file   Tobacco comments:    0.5PPD at the most.  trying to quit.  05/11/2023 hfb  Vaping Use   Vaping status: Never Used  Substance and Sexual Activity   Alcohol use: No   Drug use: No   Sexual activity: Not on file  Other Topics Concern   Not on file  Social History Narrative   Not on file   Social Determinants of Health   Financial Resource Strain: Not on file  Food Insecurity: No Food Insecurity (07/03/2023)   Hunger Vital Sign  Worried About Programme researcher, broadcasting/film/video in the Last Year: Never true    Ran Out of Food in the Last Year: Never true  Transportation Needs: No Transportation Needs (07/03/2023)   PRAPARE - Administrator, Civil Service (Medical): No    Lack of Transportation (Non-Medical): No  Physical Activity: Not on file  Stress: Not on file  Social Connections: Not on file  Intimate Partner Violence: Not At Risk (07/03/2023)   Humiliation, Afraid, Rape, and Kick questionnaire    Fear of Current or Ex-Partner: No    Emotionally Abused: No    Physically Abused: No    Sexually Abused: No    Family History  Problem Relation Age of Onset   Heart disease Mother    Atrial fibrillation Father        Intake/Output Summary (Last 24 hours) at 07/03/2023 0925 Last data filed at 07/02/2023 1728 Gross per 24 hour  Intake 500 ml  Output --  Net 500 ml    Vitals:   07/03/23 0730 07/03/23 0736 07/03/23 0800 07/03/23 0900  BP: 100/62  104/74 112/69  Pulse:  (!) 122 60 (!) 124  Resp:  15 (!) 29 20  Temp:    98.3 F (36.8 C)  TempSrc:    Oral  SpO2:  94% 90% 94%  Weight:      Height:        PHYSICAL EXAM General: middle aged male , well nourished, in no acute distress. Sitting upright in ed stretcher HEENT:  Normocephalic and atraumatic. Neck:  No JVD.  Lungs: slight conversational dyspnea on 3L by [place]. Diffuse expiratory wheezing with prolonged expiratory phase, no appreciable crackles  Heart: tachy irregular irregular. Normal S1 and S2 without gallops or murmurs.  Abdomen: Non-distended appearing.  Msk: Normal strength and tone for age. Extremities: Warm and well perfused. No clubbing, cyanosis. Trace bilateral LE edema.  Neuro: Alert and oriented X 3. Psych:  Answers questions appropriately.   Labs: Basic Metabolic Panel: Recent Labs    07/02/23 1436  NA 141  K 3.7  CL 97*  CO2 31  GLUCOSE 146*  BUN 23*  CREATININE 1.19  CALCIUM 9.0   Liver Function Tests: Recent Labs    07/02/23 1821  AST 16  ALT 17  ALKPHOS 40  BILITOT 0.9  PROT 6.9  ALBUMIN 3.9   No results for input(s): "LIPASE", "AMYLASE" in the last 72 hours. CBC: Recent Labs    07/02/23 1436  WBC 6.8  HGB 15.8  HCT 50.6  MCV 88.9  PLT 252   Cardiac Enzymes: Recent Labs    07/02/23 1436 07/02/23 1730  TROPONINIHS 14 13   BNP: Recent Labs    07/02/23 1543  BNP 661.0*   D-Dimer: No results for input(s): "DDIMER" in the last 72 hours. Hemoglobin A1C: No results for input(s): "HGBA1C" in the last 72 hours. Fasting Lipid Panel: No results for input(s): "CHOL", "HDL", "LDLCALC", "TRIG", "CHOLHDL", "LDLDIRECT" in the last 72 hours. Thyroid Function Tests: No results for input(s):  "TSH", "T4TOTAL", "T3FREE", "THYROIDAB" in the last 72 hours.  Invalid input(s): "FREET3" Anemia Panel: No results for input(s): "VITAMINB12", "FOLATE", "FERRITIN", "TIBC", "IRON", "RETICCTPCT" in the last 72 hours.   Radiology: CT Angio Chest Pulmonary Embolism (PE) W or WO Contrast  Result Date: 07/02/2023 CLINICAL DATA:  Audible wheezing. EXAM: CT ANGIOGRAPHY CHEST WITH CONTRAST TECHNIQUE: Multidetector CT imaging of the chest was performed using the standard protocol during bolus administration of intravenous contrast. Multiplanar CT  image reconstructions and MIPs were obtained to evaluate the vascular anatomy. RADIATION DOSE REDUCTION: This exam was performed according to the departmental dose-optimization program which includes automated exposure control, adjustment of the mA and/or kV according to patient size and/or use of iterative reconstruction technique. CONTRAST:  OMNIPAQUE IOHEXOL 350 MG/ML SOLN COMPARISON:  Apr 13, 2023 FINDINGS: Cardiovascular: Thoracic aorta is normal in appearance. Satisfactory opacification of the pulmonary arteries to the segmental level. No evidence of pulmonary embolism. There is mild cardiomegaly with mild coronary artery calcification. No pericardial effusion. Mediastinum/Nodes: No enlarged mediastinal, hilar, or axillary lymph nodes. Thyroid gland, trachea, and esophagus demonstrate no significant findings. Lungs/Pleura: Mild lingular, right middle lobe and bibasilar atelectasis is seen. A stable 4 mm left upper lobe lung nodule is noted. There is no evidence of acute infiltrate, pleural effusion or pneumothorax. Upper Abdomen: A punctate gallstone is seen within an otherwise normal-appearing gallbladder. Musculoskeletal: No chest wall abnormality. No acute or significant osseous findings. Review of the MIP images confirms the above findings. IMPRESSION: 1. No evidence of pulmonary embolism. 2. Mild lingular, right middle lobe and bibasilar atelectasis. 3.  Stable 4 mm left upper lobe pulmonary nodule. No follow-up needed if patient is low-risk.This recommendation follows the consensus statement: Guidelines for Management of Incidental Pulmonary Nodules Detected on CT Images: From the Fleischner Society 2017; Radiology 2017; 284:228-243. 4. Cholelithiasis. Electronically Signed   By: Aram Candela M.D.   On: 07/02/2023 21:16   DG Chest 2 View  Result Date: 07/02/2023 CLINICAL DATA:  Shortness of breath. EXAM: CHEST - 2 VIEW COMPARISON:  Chest x-ray dated Apr 16, 2023. FINDINGS: Unchanged cardiomegaly. Normal pulmonary vascularity. Mild streaky opacities at both lung bases consistent with atelectasis. No focal consolidation, pleural effusion, or pneumothorax. No acute osseous abnormality. IMPRESSION: Mild bibasilar atelectasis. Electronically Signed   By: Obie Dredge M.D.   On: 07/02/2023 15:00    ECHO 12/18/2022  1. Left ventricular ejection fraction, by estimation, is 45 to 50%. The  left ventricle has mildly decreased function. The left ventricle has no  regional wall motion abnormalities. The left ventricular internal cavity  size was mildly dilated. Left  ventricular diastolic parameters were normal.   2. Right ventricular systolic function is normal. The right ventricular  size is normal. There is mildly elevated pulmonary artery systolic  pressure.   3. The mitral valve is normal in structure. Moderate mitral valve  regurgitation. No evidence of mitral stenosis.   4. Tricuspid valve regurgitation is moderate.   5. The aortic valve is normal in structure. Aortic valve regurgitation is  not visualized. No aortic stenosis is present.   6. The inferior vena cava is normal in size with greater than 50%  respiratory variability, suggesting right atrial pressure of 3 mmHg.    TELEMETRY reviewed by me (LT) 07/03/2023 : AF RVR rate 110s-120  EKG reviewed by me: AF frequent PVC rate 122  Data reviewed by me (LT) 07/03/2023: ed note, admission  H&P last 24h vitals tele labs imaging I/O    Principal Problem:   Acute hypoxic respiratory failure (HCC) Active Problems:   COPD (chronic obstructive pulmonary disease) (HCC)   Cigarette smoker   Paroxysmal atrial fibrillation with RVR (HCC)   Acute on chronic combined systolic and diastolic CHF (congestive heart failure) (HCC)   HTN (hypertension)   SOB (shortness of breath)    ASSESSMENT AND PLAN:  Grier Rocher. 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  Cardiology is consulted for further assistance.  # Acute hypoxic respiratory failure # COPD exacerbation Diffuse wheezing on exam today, on IV solumedrol and duonebs per primary team   # Paroxysmal atrial fibrillation with RVR Likely provoked in the setting of COPD exacerbation vs running out of home diltiazem and inability to afford refills.  Rate improving after IV diltiazem, currently on IV amiodarone & metoprolol tartrate 12.5 mg every 6 hours.  - stop diltizem with borderline low BP  - continue amiodarone IV for today. Not a great long term medication with underlying COPD/asthma - start metoprolol 12.5 mg q6h for rate control  - continue eliquis 5mg  BID   # acute on chronic HFmrEF Not significantly clinically hypervolemic on exam, BNP 600s, trace peripheral edema - check BMP this AM  - likely give 60mg  IV lasix x 1 and monitor response  # tobacco abuse Encouraged complete cessation.   This patient's plan of care was discussed and created with Dr. Melton Alar and she is in agreement.  Signed: Rebeca Allegra , PA-C 07/03/2023, 9:25 AM [facility] Cardiology

## 2023-07-03 NOTE — Discharge Instructions (Addendum)
If you are interested in filing for disability, please see website below or call Phone: [phone]  [email or web address]  Or Visit:  [address] [place], [place] [address]    Some PCP options in [place] area- not a comprehensive list  [facility]- [phone] [facility]- [phone] [facility]- [phone] [facility]- [phone] [facility]- [phone] [facility]- [phone]  or [facility] Physician Referral Line [phone]   You are encouraged [facility] and arrange an application appointment to be screened for service to get set up with a physician for primary care  You may print the application and take with you to the appointment. At this web address [email or web address] Please Call [facility] for appointment  [phone]  [address] [place], [place] [address]  Office Hours Monday: Closed Tuesday: 9:00am - 4:00pm Wednesday: 9:00am - 4:00pm Thursday: 9:00am - 8:00pm Friday: Closed  Endocrinology 2nd Thursday of the Month: 5:00pm - 8:00pm  Rheumatology/Orthopedic Clinic By appointment only.  Contact for availability.     Services Not Covered Obstetrics Gastrointestinal/Liver Disease   Low Sodium Nutrition Therapy  Eating less sodium can help you if you have high blood pressure, heart failure, or kidney or liver disease.   Your body needs a little sodium, but too much sodium can cause your body to hold onto extra water. This extra water will raise your blood pressure and can cause damage to your heart, kidneys, or liver as they are forced to work harder.   Sometimes you can see how the extra fluid affects you because your hands, legs, or belly swell. You may also hold water around your heart and lungs, which makes it hard to breathe.   Even if you take medication for blood  pressure or a water pill (diuretic) to remove fluid, it is still important to have less salt in your diet.   Check with your primary care provider before drinking alcohol since it may affect the amount of fluid in your body and how your heart, kidneys, or liver work. Sodium in Food A low-sodium meal plan limits the sodium that you get from food and beverages to 1,500-2,000 milligrams (mg) per day. Salt is the main source of sodium. Read the nutrition label on the package to find out how much sodium is in one serving of a food.  Select foods with 140 milligrams (mg) of sodium or less per serving.  You may be able to eat one or two servings of foods with a little more than 140 milligrams (mg) of sodium if you are closely watching how much sodium you eat in a day.  Check the serving size on the label. The amount of sodium listed on the label shows the amount in one serving of the food. So, if you eat more than one serving, you will get more sodium than the amount listed.  Tips Cutting Back on Sodium Eat more fresh foods.  Fresh fruits and vegetables are low in sodium, as well as frozen vegetables and fruits that have no added juices or sauces.  Fresh meats are lower in sodium than processed meats, such as bacon, sausage, and hotdogs.  Not all processed foods are unhealthy, but some processed foods may have too much sodium.  Eat less salt at the table and when cooking. One of the ingredients in salt is sodium.  One teaspoon of table salt has 2,300 milligrams of sodium.  Leave the salt out of recipes for pasta, casseroles, and soups. Be a Engineer, building services.  Food packages that say "Salt-free", sodium-free", "very low sodium," and "low sodium" have less than 140 milligrams of sodium per serving.  Beware of products identified as "Unsalted," "No Salt Added," "Reduced Sodium," or "Lower Sodium." These items may still be high in sodium. You should always check the nutrition label. Add flavors to your food  without adding sodium.  Try lemon juice, lime juice, or vinegar.  Dry or fresh herbs add flavor.  Buy a sodium-free seasoning blend or make your own at home. You can purchase salt-free or sodium-free condiments like barbeque sauce in stores and online. Ask your registered dietitian nutritionist for recommendations and where to find them.   Eating in Restaurants Choose foods carefully when you eat outside your home. Restaurant foods can be very high in sodium. Many restaurants provide nutrition facts on their menus or their websites. If you cannot find that information, ask your server. Let your server know that you want your food to be cooked without salt and that you would like your salad dressing and sauces to be served on the side.    Foods Recommended Food Group Foods Recommended  Grains Bread, bagels, rolls without salted tops Homemade bread made with reduced-sodium baking powder Cold cereals, especially shredded wheat and puffed rice Oats, grits, or cream of wheat Pastas, quinoa, and rice Popcorn, pretzels or crackers without salt Corn tortillas  Protein Foods Fresh meats and fish; [place] bacon (check the nutrition labels - make sure they are not packaged in a sodium solution) Canned or packed tuna (no more than 4 ounces at 1 serving) Beans and peas Soybeans) and tofu Eggs Nuts or nut butters without salt  Dairy Milk or milk powder Plant milks, such as rice and soy Yogurt, including Greek yogurt Small amounts of natural cheese (blocks of cheese) or reduced-sodium cheese can be used in moderation. (Swiss, ricotta, and fresh mozzarella cheese are lower in sodium than the others) Cream Cheese Low sodium cottage cheese  Vegetables Fresh and frozen vegetables without added sauces or salt Homemade soups (without salt) Low-sodium, salt-free or sodium-free canned vegetables and soups  Fruit Fresh and canned fruits Dried fruits, such as raisins, cranberries, and prunes  Oils Tub or  liquid margarine, regular or without salt [place]ola, corn, peanut, olive, safflower, or sunflower oils  Condiments Fresh or dried herbs such as basil, bay leaf, dill, mustard (dry), nutmeg, paprika, parsley, rosemary, sage, or thyme.  Low sodium ketchup Vinegar  Lemon or lime juice Pepper, red pepper flakes, and cayenne. Hot sauce contains sodium, but if you use just a drop or two, it will not add up to much.  Salt-free or sodium-free seasoning mixes and marinades Simple salad dressings: vinegar and oil   Foods Not Recommended Food Group Foods Not Recommended  Grains Breads or crackers topped with salt Cereals (hot/cold) with more than 300 mg sodium per serving Biscuits, cornbread, and other "quick" breads prepared with baking soda Pre-packaged bread crumbs Seasoned and packaged rice and pasta mixes Self-rising flours  Protein Foods Cured meats: Bacon, ham, sausage, pepperoni and hot dogs Canned meats (chili, vienna sausage, or sardines) Smoked fish and meats Frozen meals that have more than 600 mg of sodium per serving Egg substitute (with added sodium)  Dairy Buttermilk Processed cheese spreads Cottage cheese (1 cup may have over 500 mg of sodium; look for low-sodium.) American or feta cheese Shredded Cheese has more sodium than blocks of cheese String cheese  Vegetables Canned vegetables (unless they are salt-free, sodium-free or low sodium)  Frozen vegetables with seasoning and sauces Sauerkraut and pickled vegetables Canned or dried soups (unless they are salt-free, sodium-free, or low sodium) [place] fries and onion rings  Fruit Dried fruits preserved with additives that have sodium  Oils Salted butter or margarine, all types of olives  Condiments Salt, sea salt, kosher salt, onion salt, and garlic salt Seasoning mixes with salt Bouillon cubes Ketchup Barbeque sauce and Worcestershire sauce unless low sodium Soy sauce Salsa, pickles, olives, relish Salad dressings: ranch,  blue cheese, [place], and [place].   Low Sodium Sample 1-Day Menu  Breakfast 1 cup cooked oatmeal  1 slice whole wheat bread toast  1 tablespoon peanut butter without salt  1 banana  1 cup 1% milk  Lunch Tacos made with: 2 corn tortillas   cup black beans, low sodium   cup roasted or grilled chicken (without skin)   avocado  Squeeze of lime juice  1 cup salad greens  1 tablespoon low-sodium salad dressing   cup strawberries  1 orange  Afternoon Snack 1/3 cup grapes  6 ounces yogurt  Evening Meal 3 ounces herb-baked fish  1 baked potato  2 teaspoons olive oil   cup cooked carrots  2 thick slices tomatoes on:  2 lettuce leaves  1 teaspoon olive oil  1 teaspoon balsamic vinegar  1 cup 1% milk  Evening Snack 1 apple   cup almonds without salt   Low-Sodium Vegetarian (Lacto-Ovo) Sample 1-Day Menu  Breakfast 1 cup cooked oatmeal  1 slice whole wheat toast  1 tablespoon peanut butter without salt  1 banana  1 cup 1% milk  Lunch Tacos made with: 2 corn tortillas   cup black beans, low sodium   cup roasted or grilled chicken (without skin)   avocado  Squeeze of lime juice  1 cup salad greens  1 tablespoon low-sodium salad dressing   cup strawberries  1 orange  Evening Meal Stir fry made with:  cup tofu  1 cup brown rice   cup broccoli   cup green beans   cup peppers   tablespoon peanut oil  1 orange  1 cup 1% milk  Evening Snack 4 strips celery  2 tablespoons hummus  1 hard-boiled egg   Low-Sodium Vegan Sample 1-Day Menu  Breakfast 1 cup cooked oatmeal  1 tablespoon peanut butter without salt  1 cup blueberries  1 cup soymilk fortified with calcium, vitamin B12, and vitamin D  Lunch 1 small whole wheat pita   cup cooked lentils  2 tablespoons hummus  4 carrot sticks  1 medium apple  1 cup soymilk fortified with calcium, vitamin B12, and vitamin D  Evening Meal Stir fry made with:  cup tofu  1 cup brown rice   cup broccoli   cup green  beans   cup peppers   tablespoon peanut oil  1 cup cantaloupe  Evening Snack 1 cup soy yogurt   cup mixed nuts  Copyright 2020  Academy of Nutrition and Dietetics. All rights reserved  Sodium Free Flavoring Tips  When cooking, the following items may be used for flavoring instead of salt or seasonings that contain sodium. Remember: A little bit of spice goes a long way! Be careful not to overseason. Spice Blend Recipe (makes about ? cup) 5 teaspoons onion powder  2 teaspoons garlic powder  2 teaspoons paprika  2 teaspoon dry mustard  1 teaspoon crushed thyme leaves   teaspoon white pepper   teaspoon celery seed Food Item Flavorings  Beef  Basil, bay leaf, caraway, curry, dill, dry mustard, garlic, grape jelly, green pepper, mace, marjoram, mushrooms (fresh), nutmeg, onion or onion powder, parsley, pepper, rosemary, sage  Chicken Basil, cloves, cranberries, mace, mushrooms (fresh), nutmeg, oregano, paprika, parsley, pineapple, saffron, sage, savory, tarragon, thyme, tomato, turmeric  Egg Chervil, curry, dill, dry mustard, garlic or garlic powder, green pepper, jelly, mushrooms (fresh), nutmeg, onion powder, paprika, parsley, rosemary, tarragon, tomato  Fish Basil, bay leaf, chervil, curry, dill, dry mustard, green pepper, lemon juice, marjoram, mushrooms (fresh), paprika, pepper, tarragon, tomato, turmeric  Lamb Cloves, curry, dill, garlic or garlic powder, mace, mint, mint jelly, onion, oregano, parsley, pineapple, rosemary, tarragon, thyme  Pork Applesauce, basil, caraway, chives, cloves, garlic or garlic powder, onion or onion powder, rosemary, thyme  Veal Apricots, basil, bay leaf, currant jelly, curry, ginger, marjoram, mushrooms (fresh), oregano, paprika  Vegetables Basil, dill, garlic or garlic powder, ginger, lemon juice, mace, marjoram, nutmeg, onion or onion powder, tarragon, tomato, sugar or sugar substitute, salt-free salad dressing, vinegar  Desserts Allspice, anise,  cinnamon, cloves, ginger, mace, nutmeg, vanilla extract, other extracts   Copyright 2020  Academy of Nutrition and Dietetics. All rights reserved  Fluid Restricted Nutrition Therapy  You have been prescribed this diet because your condition affects how much fluid you can eat or drink. If your heart, liver, or kidneys aren't working properly, you may not be able to effectively eliminate fluids from the body and this may cause swelling (edema) in the legs, arms, and/or stomach. Drink no more than _________ liters or ________ ounces or ________cups of fluid per day.  You don't need to stop eating or drinking the same fluids you normally would, but you may need to eat or drink less than usual.  Your registered dietitian nutritionist will help you determine the correct amount of fluid to consume during the day Breakfast Include fluids taken with medications  Lunch Include fluids taken with medications  Dinner Include fluids taken with medications  Bedtime Snack Include fluids taken with medications     Tips What Are Fluids?  A fluid is anything that is liquid or anything that would melt if left at room temperature. You will need to count these foods and liquids--including any liquid used to take medication--as part of your daily fluid intake. Some examples are: Alcohol (drink only with your doctor's permission)  Coffee, tea, and other hot beverages  Gelatin (Jell-O)  Gravy  Ice cream, sherbet, sorbet  Ice cubes, ice chips  Milk, liquid creamer  Nutritional supplements  Popsicles  Vegetable and fruit juices; fluid in canned fruit  Watermelon  Yogurt  Soft drinks, lemonade, limeade  Soups  Syrup How Do I Measure My Fluid Intake? Record your fluid intake daily.  Tip: Every day, each time you eat or drink fluids, pour water in the same amount into an empty container that can hold the same amount of fluids you are allowed daily. This may help you keep track of how much fluid you are  taking in throughout the day.  To accurately keep track of how much liquid you take in, measure the size of the cups, glasses, and bowls you use. If you eat soup, measure how much of it is liquid and how much is solid (such as noodles, vegetables, meat). Conversions for Measuring Fluid Intake  Milliliters (mL) Liters (L) Ounces (oz) Cups (c)  1000 1 32 4  1200 1.2 40 5  1500 1.5 50 6 1/4  1800 1.8 60 7 1/2  2000 2  67 8 1/3  Tips to Reduce Your Thirst Chew gum or suck on hard candy.  Rinse or gargle with mouthwash. Do not swallow.  Ice chips or popsicles my help quench thirst, but this too needs to be calculated into the total restriction. Melt ice chips or cubes first to figure out how much fluid they produce (for example, experiment with melting  cup ice chips or 2 ice cubes).  Add a lemon wedge to your water.  Limit how much salt you take in. A high salt intake might make you thirstier.  Don't eat or drink all your allowed liquids at once. Space your liquids out through the day.  Use small glasses and cups and sip slowly. If allowed, take your medications with fluids you eat or drink during a meal.   Fluid-Restricted Nutrition Therapy Sample 1-Day Menu  Breakfast 1 slice wheat toast  1 tablespoon peanut butter  1/2 cup yogurt (120 milliliters)  1/2 cup blueberries  1 cup milk (240 milliliters)   Lunch 3 ounces sliced [place]  2 slices whole wheat bread  1/2 cup lettuce for sandwich  2 slices tomato for sandwich  1 ounce reduced-fat, reduced-sodium cheese  1/2 cup fresh carrot sticks  1 banana  1 cup unsweetened tea (240 milliliters)   Evening Meal 8 ounces soup (240 milliliters)  3 ounces salmon  1/2 cup quinoa  1 cup green beans  1 cup mixed greens salad  1 tablespoon olive oil  1 cup coffee (240 milliliters)  Evening Snack 1/2 cup sliced peaches  1/2 cup frozen yogurt (120 milliliters)  1 cup water (240 milliliters)  Copyright 2020  Academy of Nutrition and Dietetics.  All rights reserved   Use your nebulizer and oxygen 2L/min continously

## 2023-07-04 ENCOUNTER — Other Ambulatory Visit (HOSPITAL_COMMUNITY): Payer: Self-pay

## 2023-07-04 ENCOUNTER — Other Ambulatory Visit: Payer: Self-pay

## 2023-07-04 ENCOUNTER — Inpatient Hospital Stay: Payer: Medicaid Other

## 2023-07-04 DIAGNOSIS — J9601 Acute respiratory failure with hypoxia: Secondary | ICD-10-CM | POA: Diagnosis not present

## 2023-07-04 DIAGNOSIS — R079 Chest pain, unspecified: Secondary | ICD-10-CM

## 2023-07-04 LAB — GLUCOSE, CAPILLARY
Glucose-Capillary: 190 mg/dL — ABNORMAL HIGH (ref 70–99)
Glucose-Capillary: 183 mg/dL — ABNORMAL HIGH (ref 70–99)
Glucose-Capillary: 158 mg/dL — ABNORMAL HIGH (ref 70–99)
Glucose-Capillary: 124 mg/dL — ABNORMAL HIGH (ref 70–99)

## 2023-07-04 LAB — TROPONIN I (HIGH SENSITIVITY)
Troponin I (High Sensitivity): 12 ng/L (ref <18)
Troponin I (High Sensitivity): 11 ng/L (ref <18)

## 2023-07-04 MED ORDER — METOPROLOL TARTRATE 25 MG PO TABS
25.0000 mg | ORAL_TABLET | Freq: Four times a day (QID) | ORAL | Status: AC
  Administered 2023-07-04 – 2023-07-06 (×5): 25 mg via ORAL
  Filled 2023-07-04 (×7): qty 1

## 2023-07-04 MED ORDER — INSULIN ASPART 100 UNIT/ML IJ SOLN
0.0000 [IU] | Freq: Three times a day (TID) | INTRAMUSCULAR | Status: DC
  Administered 2023-07-04 (×2): 3 [IU] via SUBCUTANEOUS
  Administered 2023-07-05 – 2023-07-06 (×5): 2 [IU] via SUBCUTANEOUS
  Administered 2023-07-06: 3 [IU] via SUBCUTANEOUS
  Administered 2023-07-07 – 2023-07-09 (×6): 2 [IU] via SUBCUTANEOUS
  Administered 2023-07-09: 5 [IU] via SUBCUTANEOUS
  Administered 2023-07-09: 3 [IU] via SUBCUTANEOUS
  Administered 2023-07-10 (×2): 2 [IU] via SUBCUTANEOUS
  Filled 2023-07-04 (×14): qty 1

## 2023-07-04 MED ORDER — METHYLPREDNISOLONE SODIUM SUCC 40 MG IJ SOLR
40.0000 mg | INTRAMUSCULAR | Status: DC
  Administered 2023-07-05: 40 mg via INTRAVENOUS
  Filled 2023-07-04: qty 1

## 2023-07-04 NOTE — TOC Benefit Eligibility Note (Signed)
Patient Product/process development scientist completed.    The patient is insured through [facility] MEDICAID.     Ran test claim for Entresto 24-26 mg and the current 30 day co-pay is $4.00.   This test claim was processed through [facility]- copay amounts may vary at other pharmacies due to pharmacy/plan contracts, or as the patient moves through the different stages of their insurance plan.     Kenneth Benson, CPHT Pharmacy Patient Advocate Specialist [facility] Patient Advocate Team Direct Number: [phone]  Fax: [phone]

## 2023-07-04 NOTE — Consult Note (Signed)
PULMONOLOGY         Date: 07/04/2023,   MRN# [id number] Kenneth Benson Apr 20, 1961     AdmissionWeight: 122.5 kg                 CurrentWeight: 122.5 kg  Referring provider: Dr Joylene Igo   CHIEF COMPLAINT:   Severe acute exacerbation of COPD   HISTORY OF PRESENT ILLNESS   62 year old male with history of A-fib, AKI, dysrhythmia, chronic asthma, CHF, lifelong smoking history with COPD and chronic hypoxemia, previous MRI who came in with chief complaint of dyspnea and progressively worsening shortness of breath.  Patient admits to noncompliance with supplemental oxygen at home.  Was found to be in A-fib RVR on presentation.  Required 3 L of oxygen to reach normoxia.  Initial blood work showed elevation of BNP suggestive of cardiac stress.  He was tested for COVID-19 and influenza on admission which were both negative.  His most recent echo shows reduced ejection fraction of 45% PCCM consultation for further evaluation management.   PAST MEDICAL HISTORY   Past Medical History:  Diagnosis Date  . A-fib (HCC) 02/06/2022  . AKI (acute kidney injury) (HCC) 12/17/2022  . Arrhythmia    atrial fibrillation  . Asthma   . CHF (congestive heart failure) (HCC)   . COPD (chronic obstructive pulmonary disease) (HCC)   . Hyperkalemia 12/17/2022  . Hypokalemia 04/13/2023  . Myocardial injury 04/13/2023  . Tobacco abuse      SURGICAL HISTORY   History reviewed. No pertinent surgical history.   FAMILY HISTORY   Family History  Problem Relation Age of Onset  . Heart disease Mother   . Atrial fibrillation Father      SOCIAL HISTORY   Social History   Tobacco Use  . Smoking status: Every Day    Current packs/day: 1.50    Average packs/day: 1.5 packs/day for 27.0 years (40.5 ttl pk-yrs)    Types: Cigarettes  . Tobacco comments:    0.5PPD at the most.  trying to quit.  05/11/2023 hfb  Vaping Use  . Vaping status: Never Used  Substance Use Topics  . Alcohol use: No   . Drug use: No     MEDICATIONS    Home Medication:    Current Medication:  Current Facility-Administered Medications:  .  acetaminophen (TYLENOL) tablet 650 mg, 650 mg, Oral, Q6H PRN, 650 mg at 07/04/23 0833 **OR** acetaminophen (TYLENOL) suppository 650 mg, 650 mg, Rectal, Q6H PRN, [name], Ekta V, MD .  albuterol (PROVENTIL) (2.5 MG/3ML) 0.083% nebulizer solution 2.5 mg, 2.5 mg, Nebulization, Q2H PRN, Gertha Calkin, MD .  [address] amiodarone (NEXTERONE) 1.8 mg/mL load via infusion 150 mg, 150 mg, Intravenous, Once, 150 mg at 07/03/23 0936 **FOLLOWED BY** [EXPIRED] amiodarone (NEXTERONE PREMIX) 360-4.14 MG/200ML-% (1.8 mg/mL) IV infusion, 60 mg/hr, Intravenous, Continuous, Stopped at 07/03/23 1452 **FOLLOWED BY** amiodarone (NEXTERONE PREMIX) 360-4.14 MG/200ML-% (1.8 mg/mL) IV infusion, 30 mg/hr, Intravenous, Continuous, Agbata, Tochukwu, MD, Last Rate: 16.67 mL/hr at 07/04/23 1039, 30 mg/hr at 07/04/23 1039 .  apixaban (ELIQUIS) tablet 5 mg, 5 mg, Oral, BID, Gertha Calkin, MD, 5 mg at 07/04/23 [phone] .  bisacodyl (DULCOLAX) EC tablet 5 mg, 5 mg, Oral, Daily PRN, Irena Cords V, MD .  docusate sodium (COLACE) capsule 100 mg, 100 mg, Oral, BID, Irena Cords V, MD, 100 mg at 07/03/23 0946 .  empagliflozin (JARDIANCE) tablet 10 mg, 10 mg, Oral, Daily, Agbata, Tochukwu, MD, 10 mg at 07/04/23 0833 .  guaiFENesin (MUCINEX) 12 hr tablet 600 mg, 600 mg, Oral, BID PRN, Gertha Calkin, MD .  hydrALAZINE (APRESOLINE) injection 5 mg, 5 mg, Intravenous, Q4H PRN, Gertha Calkin, MD .  HYDROcodone-acetaminophen (NORCO/VICODIN) 5-325 MG per tablet 1-2 tablet, 1-2 tablet, Oral, Q4H PRN, Irena Cords V, MD .  insulin aspart (novoLOG) injection 0-15 Units, 0-15 Units, Subcutaneous, TID WC, Agbata, Tochukwu, MD .  ipratropium (ATROVENT) nebulizer solution 0.5 mg, 0.5 mg, Nebulization, Q6H, Agbata, Tochukwu, MD, 0.5 mg at 07/04/23 0806 .  levalbuterol (XOPENEX) nebulizer solution 0.63 mg, 0.63 mg, Nebulization, Q6H,  Agbata, Tochukwu, MD, 0.63 mg at 07/04/23 0806 .  methylPREDNISolone sodium succinate (SOLU-MEDROL) 40 mg/mL injection 40 mg, 40 mg, Intravenous, Q12H, Agbata, Tochukwu, MD, 40 mg at 07/04/23 0538 .  metoprolol tartrate (LOPRESSOR) tablet 25 mg, 25 mg, Oral, Q6H, Tang, Cheryln Manly, PA-C .  morphine (PF) 2 MG/ML injection 2 mg, 2 mg, Intravenous, Q2H PRN, Irena Cords V, MD .  nicotine (NICODERM CQ - dosed in mg/24 hours) patch 14 mg, 14 mg, Transdermal, Daily, Gertha Calkin, MD, 14 mg at 07/02/23 2001 .  ondansetron (ZOFRAN) tablet 4 mg, 4 mg, Oral, Q6H PRN **OR** ondansetron (ZOFRAN) injection 4 mg, 4 mg, Intravenous, Q6H PRN, [name], Ekta V, MD .  polyethylene glycol (MIRALAX / GLYCOLAX) packet 17 g, 17 g, Oral, Daily PRN, Irena Cords V, MD .  sodium chloride flush (NS) 0.9 % injection 3 mL, 3 mL, Intravenous, Q12H, Gertha Calkin, MD, 3 mL at 07/04/23 0834 .  torsemide (DEMADEX) tablet 40 mg, 40 mg, Oral, Daily, Agbata, Tochukwu, MD, 40 mg at 07/04/23 7564    ALLERGIES   Patient has no known allergies.     REVIEW OF SYSTEMS    Review of Systems:  Gen:  Denies  fever, sweats, chills weigh loss  HEENT: Denies blurred vision, double vision, ear pain, eye pain, hearing loss, nose bleeds, sore throat Cardiac:  No dizziness, chest pain or heaviness, chest tightness,edema Resp:   reports dyspnea chronically  Gi: Denies swallowing difficulty, stomach pain, nausea or vomiting, diarrhea, constipation, bowel incontinence Gu:  Denies bladder incontinence, burning urine Ext:   Denies Joint pain, stiffness or swelling Skin: Denies  skin rash, easy bruising or bleeding or hives Endoc:  Denies polyuria, polydipsia , polyphagia or weight change Psych:   Denies depression, insomnia or hallucinations   Other:  All other systems negative   VS: BP (!) 83/66 (BP Location: Right Arm)   Pulse (!) 108   Temp 98 F (36.7 C)   Resp (!) 23   Ht 6' (1.829 m)   Wt 122.5 kg   SpO2 95%   BMI 36.62  kg/m      PHYSICAL EXAM    GENERAL:NAD, no fevers, chills, no weakness no fatigue HEAD: Normocephalic, atraumatic.  EYES: Pupils equal, round, reactive to light. Extraocular muscles intact. No scleral icterus.  MOUTH: Moist mucosal membrane. Dentition intact. No abscess noted.  EAR, NOSE, THROAT: Clear without exudates. No external lesions.  NECK: Supple. No thyromegaly. No nodules. No JVD.  PULMONARY: decreased breath sounds with mild rhonchi worse at bases bilaterally.  CARDIOVASCULAR: S1 and S2. Regular rate and rhythm. No murmurs, rubs, or gallops. No edema. Pedal pulses 2+ bilaterally.  GASTROINTESTINAL: Soft, nontender, nondistended. No masses. Positive bowel sounds. No hepatosplenomegaly.  MUSCULOSKELETAL: No swelling, clubbing, or edema. Range of motion full in all extremities.  NEUROLOGIC: Cranial nerves II through XII are intact. No gross focal neurological deficits. Sensation intact.  Reflexes intact.  SKIN: No ulceration, lesions, rashes, or cyanosis. Skin warm and dry. Turgor intact.  PSYCHIATRIC: Mood, affect within normal limits. The patient is awake, alert and oriented x 3. Insight, judgment intact.       IMAGING      ASSESSMENT/PLAN   Acute on chronic hypoxemic respiratory failure -there is interstitial edema, atelectasis bilaterally and cardiomegaly.  -he reports being off his medications due to cost prohibitive pricing.  -patient states he requires financial assistance.  -he states he stopped taking his diuretics and cardiac medications -he appears to be in decompensated heart failure -would diurese first and recruit lungs with IS and metaneb -he needs PT/OT    Tobacco abuse   - patient continues to smoke actively   Acute decompensated systolic CHF with EF <45% - S/p lasix 60mg  and now on torsemide 40mg  -patient shares he is able to urinate adequately without issues -he has AF and is on metoprolol and this makes diuresis more complicated.         Thank you for allowing me to participate in the care of this patient.   Patient/Family are satisfied with care plan and all questions have been answered.    Provider disclosure: Patient with at least one acute or chronic illness or injury that poses a threat to life or bodily function and is being managed actively during this encounter.  All of the below services have been performed independently by signing provider:  review of prior documentation from internal and or external health records.  Review of previous and current lab results.  Interview and comprehensive assessment during patient visit today. Review of current and previous chest radiographs/CT scans. Discussion of management and test interpretation with health care team and patient/family.   This document was prepared using Dragon voice recognition software and may include unintentional dictation errors.     [name], M.D.  Division of Pulmonary & Critical Care Medicine

## 2023-07-04 NOTE — Progress Notes (Signed)
OT Cancellation Note  Patient Details Name: Kenneth Benson MRN: [id number] DOB: December 19, 1960   Cancelled Treatment:    Reason Eval/Treat Not Completed: Medical issues which prohibited therapy. Chart reviewed. HR still not well controlled. Will hold OT Evaluation this morning.   Arman Filter., MPH, MS, OTR/L ascom [phone] 07/04/23, 10:17 AM

## 2023-07-04 NOTE — Plan of Care (Signed)
  Problem: Respiratory: Goal: Ability to maintain a clear airway will improve Outcome: Progressing   Problem: Respiratory: Goal: Levels of oxygenation will improve Outcome: Progressing   Problem: Respiratory: Goal: Ability to maintain adequate ventilation will improve Outcome: Progressing   Problem: Clinical Measurements: Goal: Cardiovascular complication will be avoided Outcome: Progressing   Problem: Clinical Measurements: Goal: Respiratory complications will improve Outcome: Progressing   Problem: Activity: Goal: Risk for activity intolerance will decrease Outcome: Progressing

## 2023-07-04 NOTE — Progress Notes (Signed)
   07/04/23 1400  Spiritual Encounters  Type of Visit Initial  Care provided to: Patient  Conversation partners present during encounter Nurse  Referral source Code page  Reason for visit Code  OnCall Visit No   Chap responded to Rapid Response call. Pt in the bed, Medial team in the room actively evaluating treating Pt. No spiritual needs at this moment. Kindly page [place] as need arise.

## 2023-07-04 NOTE — Progress Notes (Signed)
Notified by respiratory therapist at approximately 1350 pulse ox showing pulse rate 177. Entered room to find patient with audible wheezing at bedside and c/o chest pain 6/10 radiating up through neck. EKG performed, patient became flushed and diaphoretic so rapid response called. Rapid response RN Sarah, Dr. Joylene Igo, respiratory therapist Bambi, Grayce Sessions, and primary RN Thea Silversmith to bedside. Troponin, chest xray ordered.     07/04/23 1354  Vitals  BP 93/70  MAP (mmHg) 78  BP Location Left Arm  BP Method Automatic  Patient Position (if appropriate) Lying  Pulse Rate (!) 116  Pulse Rate Source Monitor  Resp (!) 32  Level of Consciousness  Level of Consciousness Alert  MEWS COLOR  MEWS Score Color Red  Oxygen Therapy  SpO2 95 %  O2 Device Nasal Cannula  O2 Flow Rate (L/min) 3 L/min  Pain Assessment  Pain Scale 0-10  Pain Score 6  Pain Type Acute pain  Pain Location Chest  Pain Orientation Mid  Pain Radiating Towards neck  Pain Descriptors / Indicators Tightness;Heaviness  MEWS Score  MEWS Temp 0  MEWS Systolic 1  MEWS Pulse 2  MEWS RR 2  MEWS LOC 0  MEWS Score 5

## 2023-07-04 NOTE — TOC Progression Note (Addendum)
Transition of Care [facility]) - Progression Note    Patient Details  Name: Kenneth Benson MRN: [id number] Date of Birth: 1961/03/17  Transition of Care [facility]) CM/SW Contact  Darolyn Rua, [place] Phone Number: 07/04/2023, 10:28 AM  Clinical Narrative:     CSW received message from [facility] secretary stating that  patient's wife ([name]) left a message this morning stating the patient was supposed to be in court today and is requesting [place] to fax a statement to them about being at the hospital and unable to appear today. Left [phone] as the number to reach her by.   CSW called [name] back, lvm, CSW spoke with [facility] supervisor regarding this request.   Per [facility] supervisor MD would have to sign a note and we can fax it to the attorney. MD made aware.   Disability resources added to AVS if patient is interested in disability he will have to go to   Phone:[phone]   [email or web address]   Or Visit:   [address] [place], [place] [address]     Barriers to Discharge: Continued Medical Work up  Expected Discharge Plan and Services     Post Acute Care Choice: Home Health, Skilled Nursing Facility Living arrangements for the past 2 months: Single Family Home                                       Social Determinants of Health (SDOH) Interventions SDOH Screenings   Food Insecurity: No Food Insecurity (07/03/2023)  Housing: Low Risk  (07/03/2023)  Transportation Needs: No Transportation Needs (07/03/2023)  Utilities: Not At Risk (07/03/2023)  Tobacco Use: High Risk (07/03/2023)    Readmission Risk Interventions     No data to display

## 2023-07-04 NOTE — Plan of Care (Signed)
  Problem: Education: Goal: Knowledge of General Education information will improve Description: Including pain rating scale, medication(s)/side effects and non-pharmacologic comfort measures Outcome: Progressing   Problem: Respiratory: Goal: Ability to maintain adequate ventilation will improve Outcome: Progressing   Problem: Respiratory: Goal: Levels of oxygenation will improve Outcome: Progressing   Problem: Respiratory: Goal: Ability to maintain a clear airway will improve Outcome: Progressing

## 2023-07-04 NOTE — Plan of Care (Signed)
  Problem: Education: Goal: Ability to demonstrate management of disease process will improve Outcome: Progressing Goal: Ability to verbalize understanding of medication therapies will improve Outcome: Progressing Goal: Individualized Educational Video(s) Outcome: Progressing   Problem: Activity: Goal: Capacity to carry out activities will improve Outcome: Progressing   Problem: Cardiac: Goal: Ability to achieve and maintain adequate cardiopulmonary perfusion will improve Outcome: Progressing   Problem: Education: Goal: Knowledge of disease or condition will improve Outcome: Progressing Goal: Knowledge of the prescribed therapeutic regimen will improve Outcome: Progressing Goal: Individualized Educational Video(s) Outcome: Progressing   Problem: Activity: Goal: Ability to tolerate increased activity will improve Outcome: Progressing Goal: Will verbalize the importance of balancing activity with adequate rest periods Outcome: Progressing   Problem: Respiratory: Goal: Ability to maintain a clear airway will improve Outcome: Progressing Goal: Levels of oxygenation will improve Outcome: Progressing Goal: Ability to maintain adequate ventilation will improve Outcome: Progressing   Problem: Education: Goal: Knowledge of General Education information will improve Description: Including pain rating scale, medication(s)/side effects and non-pharmacologic comfort measures Outcome: Progressing   Problem: Health Behavior/Discharge Planning: Goal: Ability to manage health-related needs will improve Outcome: Progressing   Problem: Clinical Measurements: Goal: Ability to maintain clinical measurements within normal limits will improve Outcome: Progressing Goal: Will remain free from infection Outcome: Progressing Goal: Diagnostic test results will improve Outcome: Progressing Goal: Respiratory complications will improve Outcome: Progressing Goal: Cardiovascular complication  will be avoided Outcome: Progressing   Problem: Activity: Goal: Risk for activity intolerance will decrease Outcome: Progressing   Problem: Nutrition: Goal: Adequate nutrition will be maintained Outcome: Progressing   Problem: Coping: Goal: Level of anxiety will decrease Outcome: Progressing   Problem: Elimination: Goal: Will not experience complications related to bowel motility Outcome: Progressing Goal: Will not experience complications related to urinary retention Outcome: Progressing   Problem: Pain Managment: Goal: General experience of comfort will improve Outcome: Progressing   Problem: Safety: Goal: Ability to remain free from injury will improve Outcome: Progressing   Problem: Skin Integrity: Goal: Risk for impaired skin integrity will decrease Outcome: Progressing   Problem: Education: Goal: Ability to describe self-care measures that may prevent or decrease complications (Diabetes Survival Skills Education) will improve Outcome: Progressing Goal: Individualized Educational Video(s) Outcome: Progressing   Problem: Coping: Goal: Ability to adjust to condition or change in health will improve Outcome: Progressing   Problem: Fluid Volume: Goal: Ability to maintain a balanced intake and output will improve Outcome: Progressing   Problem: Health Behavior/Discharge Planning: Goal: Ability to identify and utilize available resources and services will improve Outcome: Progressing Goal: Ability to manage health-related needs will improve Outcome: Progressing   Problem: Metabolic: Goal: Ability to maintain appropriate glucose levels will improve Outcome: Progressing   Problem: Nutritional: Goal: Maintenance of adequate nutrition will improve Outcome: Progressing Goal: Progress toward achieving an optimal weight will improve Outcome: Progressing   Problem: Skin Integrity: Goal: Risk for impaired skin integrity will decrease Outcome: Progressing    Problem: Tissue Perfusion: Goal: Adequacy of tissue perfusion will improve Outcome: Progressing   

## 2023-07-04 NOTE — Progress Notes (Signed)
Progress Note   Patient: Kenneth Benson ZHY:[id number] DOB: 04/09/1961 DOA: 07/02/2023     2 DOS: the patient was seen and examined on 07/04/2023   Brief hospital course:  Kenneth Benson is a 62 y.o. male with medical history significant of CHF with EF of 45-50%, hypertension, COPD/asthma, atrial fibrillation on Eliquis, obesity, tobacco abuse, who presents with shortness of breath.Patient states that he has shortness of breath which has been progressively worsening over past 2 weeks. He has mild bilateral leg edema.  Patient is not using oxygen normally, found to have oxygen saturation 91% on room air, which improved to 99% on 3 L oxygen.  Patient states that he has not been taking his diltiazem for the past few weeks as he has run out.  HPI is limited as patient is tachycardic in A-fib RVR movement and speaking heart rate is elevated.  Patient does continue to smoke.  Denies any chest pain currently. In the emergency room: Patient is alert awake oriented mild distress due to tachycardia.  Vitals show bradycardia initially with respirations of 26 and normal blood pressure which has eventually changed to heart rate of 144 A-fib RVR respirations of 26 blood pressure of 103/88 O2 sats of 95% on 3 L. Venous blood gas-ordered and pending. BMP shows glucose 146 normal kidney function. Hepatic function test normal. BNP of 661/troponin of 14/repeat troponin of 13. Procalcitonin of less than 10. CBC within normal limits 15.8 normal white count normal platelet count. Respiratory panel is negative for COVID and influenza.    Assessment and Plan:  Acute hypoxic respiratory failure Secondary to acute COPD exacerbation Patient presented to the ER for evaluation of worsening shortness of breath from his baseline and had room air pulse oximetry of 91% requiring 3 L of oxygen to maintain pulse oximetry greater than 92% Patient will need to be assessed for home oxygen need prior to discharge       Acute COPD  exacerbation Continue as needed as well as scheduled bronchodilator therapy Continue systemic steroids Continue oxygen supplementation to maintain pulse oximetry greater than 92%       Paroxysmal A-fib with rapid ventricular rate Patient was on Cardizem infusion which was discontinued due to hypotension Continue amiodarone infusion Appreciate cardiology input, recommends to continue amiodarone IV as well as metoprolol for rate control.  Patient not a candidate for long-term therapy with amiodarone because of his COPD.       Chronic combined systolic and diastolic dysfunction CHF Stable and not acutely exacerbated Last 2D echocardiogram from 01/24 showed an LVEF of 45 to 50% Continue Jardiance, losartan, metoprolol and torsemide       Nicotine dependence Smoking cessation was discussed with patient's Continue nicotine transdermal patch       Obesity (BMI 36) Complicates overall prognosis and care Lifestyle modification and exercise has been discussed with patient in detail                Subjective: Patient is seen and examined at the bedside this morning.  States that he does not feel any better.  Complains of chest tightness and shortness of breath. Remains tachycardic and in A-fib  Physical Exam: Vitals:   07/04/23 1354 07/04/23 1413 07/04/23 1430 07/04/23 1441  BP: 93/70 111/81    Pulse: (!) 116 (!) 130    Resp: (!) 32  20 19  Temp:  98.1 F (36.7 C)    TempSrc:      SpO2: 95% 95%    Weight:  Height:       Vitals and nursing note reviewed.  Constitutional:      Appearance: He is well-developed.  HENT:     Head: Normocephalic and atraumatic.  Eyes:     Pupils: Pupils are equal, round, and reactive to light.  Cardiovascular:     Rate and Rhythm: Tachycardia present. Rhythm irregular.  Pulmonary:     Effort: Tachypnea present.     Breath sounds: Examination of the right-upper field reveals wheezing. Examination of the left-upper field reveals  wheezing. Examination of the right-middle field reveals wheezing. Examination of the left-middle field reveals wheezing. Examination of the right-lower field reveals wheezing. Examination of the left-lower field reveals wheezing. Wheezing present.  Abdominal:     General: Bowel sounds are normal.     Palpations: Abdomen is soft.  Musculoskeletal:        General: Normal range of motion.     Cervical back: Normal range of motion and neck supple.  Skin:    General: Skin is warm and dry.  Neurological:     General: No focal deficit present.     Mental Status: He is alert.  Psychiatric:        Mood and Affect: Mood normal.        Behavior: Behavior normal.    Data Reviewed: Labs reviewed.  White count 13.3 There are no new results to review at this time.  Family Communication: Plan of care discussed with patient at the bedside.  All questions and concerns have been addressed. Received a call from the [employer] charge regarding and notes that was submitted since patient was scheduled to be in court today.  Confirmed that patient is admitted and will likely be in the hospital through Friday.  The chart states that patient's case will be moved to Monday 07/10/23  Disposition: Status is: Inpatient Remains inpatient appropriate because: On IV amiodarone for rate control  Planned Discharge Destination: Home    Time spent: 38 minutes  Author: Lucile Shutters, MD 07/04/2023 3:13 PM  For on call review [email or web address].

## 2023-07-04 NOTE — Progress Notes (Signed)
Attempted to give neb treatment. Unable to administer due to HR >170. RN notified

## 2023-07-04 NOTE — Progress Notes (Signed)
PT Cancellation Note  Patient Details Name: Kenneth Benson MRN: [id number] DOB: 04/28/1961   Cancelled Treatment:    Reason Eval/Treat Not Completed: Other (comment) Pt HR elevated this AM. PT to reattempt as able.   Blenda Nicely, PT, SPT 9:37 AM,07/04/23

## 2023-07-04 NOTE — Progress Notes (Signed)
Responded to over head and paged announcement of Rapid Response team. On arrival patient RN and RRT in room. Pt awake , alert. Provider in room.

## 2023-07-04 NOTE — Progress Notes (Incomplete)
[facility] HF Stewardship  PCP: Pcp, No  PCP-Cardiologist: None  HPI: Kenneth Benson is a 62 y.o. male with COPD, Afib, CHF, HTN who presented with shortness of breath.   ECHO 12/2021 showed normal LVEF. Most recent ECHO 11/2022 showed LVEF reduced to 45-50% with moderate MR, moderate TR, and normal diastolic function. Admitted 03/2023 with COPD exacerbation.   Now admitted with combined CHF and COPD exacerbations. Initial presentation with pitting edema, elevated JVP, elevated BNP, and rales per MD. Stay complicated by AF with elevated ventricular rate. PTA losartan and metoprolol held due to hypotension. Initial diuresis with IV Lasix, now transitioned to torsemide.  Pertinent cardiac history:  Pertinent Lab Values: BUN  Date Value Ref Range Status  07/04/2023 31 (H) 6 - 20 mg/dL Final   Potassium  Date Value Ref Range Status  07/04/2023 4.4 3.5 - 5.1 mmol/L Final   Sodium  Date Value Ref Range Status  07/04/2023 137 135 - 145 mmol/L Final   B Natriuretic Peptide  Date Value Ref Range Status  07/02/2023 661.0 (H) 0.0 - 100.0 pg/mL Final    Comment:    Performed at [facility], [address] [place], [place] [address]   TSH  Date Value Ref Range Status  12/16/2022 1.505 0.350 - 4.500 uIU/mL Final    Comment:    Performed by a 3rd Generation assay with a functional sensitivity of <=0.01 uIU/mL. Performed at [facility], [address] [place], [place] [address]     Vital Signs: Temp:  [97.5 F (36.4 C)-98.3 F (36.8 C)] 98 F (36.7 C) (08/06 1207) Pulse Rate:  [56-127] 110 (08/06 1303) Cardiac Rhythm: Atrial fibrillation (08/06 0747) Resp:  [18-30] 23 (08/06 1207) BP: (83-115)/(66-85) 96/71 (08/06 1303) SpO2:  [93 %-96 %] 95 % (08/06 1207)   Intake/Output Summary (Last 24 hours) at 07/04/2023 1326 Last data filed at 07/04/2023 1200 Gross per 24 hour  Intake 974.9 ml  Output 2850 ml  Net -1875.1 ml    Current HF Medications:                  Prior to admission HF Medications:               Assessment: 1. 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 NYHA class *** symptoms.   -  Plan: 1) Medication changes recommended at this time:                2) Patient assistance:                                                                                        3) Education: -To be completed prior to discharge.  ***  Medication Assistance / Insurance Benefits Check:  Does the patient have prescription insurance?    Type of insurance plan: [place] [employer]  Does the patient qualify for medication assistance through manufacturers or grants? {CHL AMB Yes/No/Pending:210917269}   Eligible grants and/or patient assistance programs: ***   Medication assistance applications in progress: ***   Medication assistance applications approved: ***  Approved medication assistance renewals will be completed by: ***  Outpatient Pharmacy:  Prior to admission outpatient pharmacy: ***  Is the patient willing to use [facility] pharmacy at discharge? {CHL AMB Yes/No/Pending:210917269}  Is the patient willing to transition their outpatient pharmacy to utilize a [facility] outpatient pharmacy?    ***

## 2023-07-04 NOTE — Significant Event (Signed)
Rapid Response Event Note   Reason for Call :  Tachycardia, increased work of breathing, wheezing  Initial Focused Assessment:  Rapid response RN arrived in patient's room with patient in bed surrounded by 2A staff, including primary nurse Thea Silversmith, and RT. Per staff, patient had been wheezing and when RT came to do breathing treatments ordered, patient's heart rate was reading in 170s on portable pulse oximetry monitor so nursing staff notified. Chelsea RN went to check patient's monitor at 2A desk at that point but she only saw heart rate in 120s. 12 lead EKGs already obtained.  Patient alert and oriented. Intermittent audible wheezes heard. RR in upper 20s low 30s. Work of breathing mildly elevated but patient able to speak and laugh uninterrupted at jokes told by staff. Oxygen saturation 92-95% on 3L (acute). BP 111/81 MAP 88. HR observed in 110s-120s afib. Amiodarone IV infusion already.  Dr. Joylene Igo arrived shortly after rapid response RN. She performed an independent assessment and chart review of patient, in addition to discussion of patient with staff.  Interventions:  Dr. Joylene Igo ordered patient's breathing treatments to be given. She placed orders for labs to be drawn. This RN personally reviewed telemetry monitoring from 14:15 back to 13:00 and did not see any heart rates beyond mid 130s (and most often 110s, low 120s). Chelsea RN called and spoke to central monitoring who did not report seeing any heart rates over 130s during that time as well.  Plan of Care:  Patient to remain on 2A for now and currently is receiving breathing treatment with phlebotomist at bedside. No further needs from rapid response team at this time. Patient's RN to reach back out if needed.  Event Summary:   MD Notified: Dr. Joylene Igo Call Time: 14:07 Arrival Time: 14:08 End Time: 14:22  Bennie Dallas, RN

## 2023-07-04 NOTE — Progress Notes (Signed)
Patient expressed  concerns about finances, not working, and disability.  I asked if he would like to talk to a Child psychotherapist and he stated yes.  TOC notified via secure chat.

## 2023-07-04 NOTE — Progress Notes (Signed)
[facility] CLINIC CARDIOLOGY CONSULT NOTE       Patient ID: Kenneth Benson MRN: [id number] DOB/AGE: Feb 17, 1961 62 y.o.  Admit date: 07/02/2023 Referring Physician Dr. Joylene Igo Primary Physician none Primary Cardiologist previous Dr. 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  Cardiology is consulted for further assistance.  Interval History:  - feels worse today, more congestion in his throat, headache, and upper body aches - dyspnea about the same, no chest pain or palpitations - in AF on tele, rate 110s-120s  Review of systems complete and found to be negative unless listed above     Past Medical History:  Diagnosis Date   A-fib (HCC) 02/06/2022   AKI (acute kidney injury) (HCC) 12/17/2022   Arrhythmia    atrial fibrillation   Asthma    CHF (congestive heart failure) (HCC)    COPD (chronic obstructive pulmonary disease) (HCC)    Hyperkalemia 12/17/2022   Hypokalemia 04/13/2023   Myocardial injury 04/13/2023   Tobacco abuse     History reviewed. No pertinent surgical history.  Medications Prior to Admission  Medication Sig Dispense Refill Last Dose   albuterol (VENTOLIN HFA) 108 (90 Base) MCG/ACT inhaler Inhale 2 puffs into the lungs every 6 (six) hours as needed for wheezing or shortness of breath. 6.7 g 2 prn   apixaban (ELIQUIS) 5 MG TABS tablet Take 1 tablet (5 mg total) by mouth 2 (two) times daily. 60 tablet 1 07/02/2023 at 0730   JARDIANCE 10 MG TABS tablet Take 10 mg by mouth daily.   07/02/2023 at 0730   losartan (COZAAR) 25 MG tablet Take 0.5 tablets (12.5 mg total) by mouth daily. 15 tablet 1 07/02/2023 at 0730   metoprolol tartrate (LOPRESSOR) 50 MG tablet Take 1 tablet (50 mg total) by mouth 2 (two) times daily. 60 tablet 1 07/02/2023 at 0730   torsemide  (DEMADEX) 20 MG tablet Take 2 tablets (40 mg total) by mouth daily. 60 tablet 1 07/02/2023 at 0730   albuterol (PROVENTIL) (2.5 MG/3ML) 0.083% nebulizer solution Take 3 mLs (2.5 mg total) by nebulization every 6 (six) hours as needed for wheezing or shortness of breath. (Patient not taking: Reported on 07/02/2023) 720 mL 0 Not Taking   arformoterol (BROVANA) 15 MCG/2ML NEBU Take 2 mLs (15 mcg total) by nebulization 2 (two) times daily. (Patient not taking: Reported on 05/11/2023) 120 mL 1    budesonide (PULMICORT) 0.5 MG/2ML nebulizer solution Take 2 mLs (0.5 mg total) by nebulization 2 (two) times daily. (Patient not taking: Reported on 05/11/2023) 120 mL 1    diltiazem (CARDIZEM CD) 360 MG 24 hr capsule Take 1 capsule (360 mg total) by mouth daily. (Patient not taking: Reported on 07/02/2023) 30 capsule 1 Not Taking   Social History   Socioeconomic History   Marital status: Single    Spouse name: Not on file   Number of children: Not on file   Years of education: Not on file   Highest education level: Not on file  Occupational History   Not on file  Tobacco Use   Smoking status: Every Day    Current packs/day: 1.50    Average packs/day: 1.5 packs/day for 27.0 years (40.5 ttl pk-yrs)    Types: Cigarettes   Smokeless tobacco: Not on file  Tobacco comments:    0.5PPD at the most.  trying to quit.  05/11/2023 hfb  Vaping Use   Vaping status: Never Used  Substance and Sexual Activity   Alcohol use: No   Drug use: No   Sexual activity: Not on file  Other Topics Concern   Not on file  Social History Narrative   Not on file   Social Determinants of Health   Financial Resource Strain: Not on file  Food Insecurity: No Food Insecurity (07/03/2023)   Hunger Vital Sign    Worried About Running Out of Food in the Last Year: Never true    Ran Out of Food in the Last Year: Never true  Transportation Needs: No Transportation Needs (07/03/2023)   PRAPARE - Administrator, Civil Service  (Medical): No    Lack of Transportation (Non-Medical): No  Physical Activity: Not on file  Stress: Not on file  Social Connections: Not on file  Intimate Partner Violence: Not At Risk (07/03/2023)   Humiliation, Afraid, Rape, and Kick questionnaire    Fear of Current or Ex-Partner: No    Emotionally Abused: No    Physically Abused: No    Sexually Abused: No    Family History  Problem Relation Age of Onset   Heart disease Mother    Atrial fibrillation Father       Intake/Output Summary (Last 24 hours) at 07/04/2023 0817 Last data filed at 07/04/2023 0745 Gross per 24 hour  Intake 737.9 ml  Output 2150 ml  Net -1412.1 ml    Vitals:   07/04/23 0220 07/04/23 0309 07/04/23 0538 07/04/23 0809  BP:  101/85    Pulse:   (!) 110 (!) 111  Resp:  (!) 21  (!) 24  Temp:  98.2 F (36.8 C)    TempSrc:  Axillary    SpO2: 93% 94%  95%  Weight:      Height:        PHYSICAL EXAM General: middle aged male , well nourished, in no acute distress. Laying nearly flat in PCU bed HEENT:  Normocephalic and atraumatic. Neck:  No JVD.  Lungs: slight conversational dyspnea on 3L by [place]. Diffuse expiratory wheezing with prolonged expiratory phase, no appreciable crackles  Heart: tachy irregular irregular. Normal S1 and S2 without gallops or murmurs.  Abdomen: Non-distended appearing.  Msk: Normal strength and tone for age. Extremities: Warm and well perfused. No clubbing, cyanosis. No LE edema.  Neuro: Alert and oriented X 3. Psych:  Answers questions appropriately.   Labs: Basic Metabolic Panel: Recent Labs    07/03/23 0936 07/04/23 0623  NA 137 137  K 3.9 4.4  CL 95* 98  CO2 30 30  GLUCOSE 176* 159*  BUN 29* 31*  CREATININE 1.16 1.11  CALCIUM 8.8* 8.7*  MG 2.0  --    Liver Function Tests: Recent Labs    07/02/23 1821  AST 16  ALT 17  ALKPHOS 40  BILITOT 0.9  PROT 6.9  ALBUMIN 3.9   No results for input(s): "LIPASE", "AMYLASE" in the last 72 hours. CBC: Recent Labs     07/03/23 0936 07/04/23 0623  WBC 13.3* 13.3*  HGB 15.0 14.6  HCT 48.0 46.0  MCV 89.1 87.8  PLT 238 227   Cardiac Enzymes: Recent Labs    07/02/23 1436 07/02/23 1730  TROPONINIHS 14 13   BNP: Recent Labs    07/02/23 1543  BNP 661.0*   D-Dimer: No results for input(s): "DDIMER" in the  last 72 hours. Hemoglobin A1C: No results for input(s): "HGBA1C" in the last 72 hours. Fasting Lipid Panel: No results for input(s): "CHOL", "HDL", "LDLCALC", "TRIG", "CHOLHDL", "LDLDIRECT" in the last 72 hours. Thyroid Function Tests: No results for input(s): "TSH", "T4TOTAL", "T3FREE", "THYROIDAB" in the last 72 hours.  Invalid input(s): "FREET3" Anemia Panel: No results for input(s): "VITAMINB12", "FOLATE", "FERRITIN", "TIBC", "IRON", "RETICCTPCT" in the last 72 hours.   Radiology: CT Angio Chest Pulmonary Embolism (PE) W or WO Contrast  Result Date: 07/02/2023 CLINICAL DATA:  Audible wheezing. EXAM: CT ANGIOGRAPHY CHEST WITH CONTRAST TECHNIQUE: Multidetector CT imaging of the chest was performed using the standard protocol during bolus administration of intravenous contrast. Multiplanar CT image reconstructions and MIPs were obtained to evaluate the vascular anatomy. RADIATION DOSE REDUCTION: This exam was performed according to the departmental dose-optimization program which includes automated exposure control, adjustment of the mA and/or kV according to patient size and/or use of iterative reconstruction technique. CONTRAST:  OMNIPAQUE IOHEXOL 350 MG/ML SOLN COMPARISON:  Apr 13, 2023 FINDINGS: Cardiovascular: Thoracic aorta is normal in appearance. Satisfactory opacification of the pulmonary arteries to the segmental level. No evidence of pulmonary embolism. There is mild cardiomegaly with mild coronary artery calcification. No pericardial effusion. Mediastinum/Nodes: No enlarged mediastinal, hilar, or axillary lymph nodes. Thyroid gland, trachea, and esophagus demonstrate no significant  findings. Lungs/Pleura: Mild lingular, right middle lobe and bibasilar atelectasis is seen. A stable 4 mm left upper lobe lung nodule is noted. There is no evidence of acute infiltrate, pleural effusion or pneumothorax. Upper Abdomen: A punctate gallstone is seen within an otherwise normal-appearing gallbladder. Musculoskeletal: No chest wall abnormality. No acute or significant osseous findings. Review of the MIP images confirms the above findings. IMPRESSION: 1. No evidence of pulmonary embolism. 2. Mild lingular, right middle lobe and bibasilar atelectasis. 3. Stable 4 mm left upper lobe pulmonary nodule. No follow-up needed if patient is low-risk.This recommendation follows the consensus statement: Guidelines for Management of Incidental Pulmonary Nodules Detected on CT Images: From the Fleischner Society 2017; Radiology 2017; 284:228-243. 4. Cholelithiasis. Electronically Signed   By: Aram Candela M.D.   On: 07/02/2023 21:16   DG Chest 2 View  Result Date: 07/02/2023 CLINICAL DATA:  Shortness of breath. EXAM: CHEST - 2 VIEW COMPARISON:  Chest x-ray dated Apr 16, 2023. FINDINGS: Unchanged cardiomegaly. Normal pulmonary vascularity. Mild streaky opacities at both lung bases consistent with atelectasis. No focal consolidation, pleural effusion, or pneumothorax. No acute osseous abnormality. IMPRESSION: Mild bibasilar atelectasis. Electronically Signed   By: Obie Dredge M.D.   On: 07/02/2023 15:00    ECHO 12/18/2022  1. Left ventricular ejection fraction, by estimation, is 45 to 50%. The  left ventricle has mildly decreased function. The left ventricle has no  regional wall motion abnormalities. The left ventricular internal cavity  size was mildly dilated. Left  ventricular diastolic parameters were normal.   2. Right ventricular systolic function is normal. The right ventricular  size is normal. There is mildly elevated pulmonary artery systolic  pressure.   3. The mitral valve is normal in  structure. Moderate mitral valve  regurgitation. No evidence of mitral stenosis.   4. Tricuspid valve regurgitation is moderate.   5. The aortic valve is normal in structure. Aortic valve regurgitation is  not visualized. No aortic stenosis is present.   6. The inferior vena cava is normal in size with greater than 50%  respiratory variability, suggesting right atrial pressure of 3 mmHg.    TELEMETRY reviewed  by me (LT) 07/04/2023 : AF RVR rate 110s-120  EKG reviewed by me: AF frequent PVC rate 122  Data reviewed by me (LT) 07/04/2023:  admission H&P last 24h vitals tele labs imaging I/O    Principal Problem:   Acute hypoxic respiratory failure (HCC) Active Problems:   COPD (chronic obstructive pulmonary disease) (HCC)   Cigarette smoker   Paroxysmal atrial fibrillation with RVR (HCC)   Acute on chronic combined systolic and diastolic CHF (congestive heart failure) (HCC)   HTN (hypertension)    ASSESSMENT AND PLAN:  Kenneth Benson is a 49yoM with a PMH of asthma, COPD, ongoing tobacco use, 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  Cardiology is consulted for further assistance.  # Acute hypoxic respiratory failure # COPD exacerbation Diffuse wheezing on exam today, on IV solumedrol and duonebs per primary team   # Paroxysmal atrial fibrillation with RVR Likely provoked in the setting of COPD exacerbation vs running out of home diltiazem and inability to afford refills.  Rate remains elevated in the 110s-120s today - stop diltizem with borderline low BP  - continue amiodarone IV for today. Not a great long term medication with underlying COPD/asthma - increase metoprolol  from 12.5 mg to 25mg  q6h for rate control  - continue eliquis 5mg  BID   # acute on chronic HFmrEF Not significantly clinically hypervolemic on exam, BNP 600s, trace peripheral edema - s/p 60mg  IV lasix x 1 with resolution in  peripheral edema. Defer additional diuresis today  # tobacco abuse Encouraged complete cessation.   This patient's plan of care was discussed and created with Dr. Melton Alar and she is in agreement.  Signed: Rebeca Allegra , PA-C 07/04/2023, 8:17 AM [facility] Cardiology

## 2023-07-05 DIAGNOSIS — J9601 Acute respiratory failure with hypoxia: Secondary | ICD-10-CM | POA: Diagnosis not present

## 2023-07-05 LAB — GLUCOSE, CAPILLARY
Glucose-Capillary: 134 mg/dL — ABNORMAL HIGH (ref 70–99)
Glucose-Capillary: 129 mg/dL — ABNORMAL HIGH (ref 70–99)
Glucose-Capillary: 125 mg/dL — ABNORMAL HIGH (ref 70–99)
Glucose-Capillary: 129 mg/dL — ABNORMAL HIGH (ref 70–99)

## 2023-07-05 LAB — MAGNESIUM: Magnesium: 2.4 mg/dL (ref 1.7–2.4)

## 2023-07-05 MED ORDER — DILTIAZEM HCL 30 MG PO TABS
30.0000 mg | ORAL_TABLET | Freq: Four times a day (QID) | ORAL | Status: DC
  Administered 2023-07-05 – 2023-07-07 (×6): 30 mg via ORAL
  Filled 2023-07-05 (×8): qty 1

## 2023-07-05 MED ORDER — PREDNISONE 50 MG PO TABS
50.0000 mg | ORAL_TABLET | Freq: Every day | ORAL | Status: AC
  Administered 2023-07-06: 50 mg via ORAL
  Filled 2023-07-05: qty 1

## 2023-07-05 MED ORDER — PREDNISONE 20 MG PO TABS
40.0000 mg | ORAL_TABLET | Freq: Every day | ORAL | Status: AC
  Administered 2023-07-08: 40 mg via ORAL
  Filled 2023-07-05: qty 2

## 2023-07-05 MED ORDER — PREDNISONE 10 MG PO TABS: 15.0000 mg | ORAL_TABLET | Freq: Every day | ORAL | Status: DC

## 2023-07-05 MED ORDER — PREDNISONE 50 MG PO TABS: 25.0000 mg | ORAL_TABLET | Freq: Every day | ORAL | Status: DC

## 2023-07-05 MED ORDER — PREDNISONE 20 MG PO TABS: 20.0000 mg | ORAL_TABLET | Freq: Every day | ORAL | Status: DC

## 2023-07-05 MED ORDER — PREDNISONE 10 MG PO TABS: 10.0000 mg | ORAL_TABLET | Freq: Every day | ORAL | Status: DC

## 2023-07-05 MED ORDER — PREDNISONE 50 MG PO TABS
45.0000 mg | ORAL_TABLET | Freq: Every day | ORAL | Status: AC
  Administered 2023-07-07: 45 mg via ORAL
  Filled 2023-07-05: qty 1

## 2023-07-05 MED ORDER — SPIRONOLACTONE 12.5 MG HALF TABLET
12.5000 mg | ORAL_TABLET | Freq: Every day | ORAL | Status: DC
  Administered 2023-07-05 – 2023-07-10 (×6): 12.5 mg via ORAL
  Filled 2023-07-05 (×6): qty 1

## 2023-07-05 MED ORDER — PREDNISONE 50 MG PO TABS
35.0000 mg | ORAL_TABLET | Freq: Every day | ORAL | Status: AC
  Administered 2023-07-09: 35 mg via ORAL
  Filled 2023-07-05: qty 1

## 2023-07-05 MED ORDER — MAGNESIUM SULFATE 2 GM/50ML IV SOLN
2.0000 g | Freq: Once | INTRAVENOUS | Status: AC
  Administered 2023-07-05: 2 g via INTRAVENOUS
  Filled 2023-07-05: qty 50

## 2023-07-05 MED ORDER — PREDNISONE 10 MG PO TABS: 5.0000 mg | ORAL_TABLET | Freq: Every day | ORAL | Status: DC

## 2023-07-05 MED ORDER — PREDNISONE 50 MG PO TABS: 50.0000 mg | ORAL_TABLET | Freq: Every day | ORAL | Status: DC

## 2023-07-05 MED ORDER — PREDNISONE 20 MG PO TABS
30.0000 mg | ORAL_TABLET | Freq: Every day | ORAL | Status: AC
  Administered 2023-07-10: 30 mg via ORAL
  Filled 2023-07-05: qty 1

## 2023-07-05 NOTE — Progress Notes (Signed)
Progress Note   Patient: Kenneth Benson:[id number] DOB: 1961-09-21 DOA: 07/02/2023     3 DOS: the patient was seen and examined on 07/05/2023   Brief hospital course:  Kenneth Benson is a 62 y.o. male with medical history significant of CHF with EF of 45-50%, hypertension, COPD/asthma, atrial fibrillation on Eliquis, obesity, tobacco abuse, who presents with shortness of breath.Patient states that he has shortness of breath which has been progressively worsening over past 2 weeks. He has mild bilateral leg edema.  Patient is not using oxygen normally, found to have oxygen saturation 91% on room air, which improved to 99% on 3 L oxygen.  Patient states that he has not been taking his diltiazem for the past few weeks as he has run out.  HPI is limited as patient is tachycardic in A-fib RVR movement and speaking heart rate is elevated.  Patient does continue to smoke.  Denies any chest pain currently. In the emergency room: Patient is alert awake oriented mild distress due to tachycardia.  Vitals show bradycardia initially with respirations of 26 and normal blood pressure which has eventually changed to heart rate of 144 A-fib RVR respirations of 26 blood pressure of 103/88 O2 sats of 95% on 3 L. Venous blood gas-ordered and pending. BMP shows glucose 146 normal kidney function. Hepatic function test normal. BNP of 661/troponin of 14/repeat troponin of 13. Procalcitonin of less than 10. CBC within normal limits 15.8 normal white count normal platelet count. Respiratory panel is negative for COVID and influenza.    Assessment and Plan:  Acute hypoxic respiratory failure Secondary to acute COPD exacerbation Patient presented to the ER for evaluation of worsening shortness of breath from his baseline and had room air pulse oximetry of 91% requiring 3 L of oxygen to maintain pulse oximetry greater than 92% Patient will need to be assessed for home oxygen need prior to discharge       Acute COPD  exacerbation Continue as needed as well as scheduled bronchodilator therapy Continue systemic steroids Continue oxygen supplementation to maintain pulse oximetry greater than 92%       Paroxysmal A-fib with rapid ventricular rate Patient was on Cardizem infusion which was discontinued due to hypotension He was then placed on amiodarone infusion but this has been discontinued since he is not a candidate for long-term therapy with amiodarone due to his underlying COPD. Cardiology recommends metoprolol and Cardizem Continue Eliquis as primary prophylaxis for an acute stroke     Chronic combined systolic and diastolic dysfunction CHF Stable and not acutely exacerbated Last 2D echocardiogram from 01/24 showed an LVEF of 45 to 50% Continue Jardiance, losartan, metoprolol and torsemide       Nicotine dependence Smoking cessation was discussed with patient's Continue nicotine transdermal patch       Obesity (BMI 36) Complicates overall prognosis and care Lifestyle modification and exercise has been discussed with patient in detail                 Subjective: Patient is seen and examined at the bedside.  Rapid response was called on 08/06 around 245 due to patient's tachycardia and chest tightness.  He feels better this morning.  Physical Exam: Vitals:   07/05/23 0744 07/05/23 0805 07/05/23 1211 07/05/23 1402  BP:  107/77 104/75   Pulse:  96 (!) 106   Resp:  18 (!) 22   Temp:  98.3 F (36.8 C) 98.3 F (36.8 C)   TempSrc:  Oral Oral  SpO2: 92% 97% 96% 95%  Weight:  128.4 kg    Height:        Vitals and nursing note reviewed.  Constitutional:      Appearance: He is well-developed.  HENT:     Head: Normocephalic and atraumatic.  Eyes:     Pupils: Pupils are equal, round, and reactive to light.  Cardiovascular:     Rate and Rhythm: Tachycardia present. Rhythm irregular.  Pulmonary:     Effort: Tachypnea present.     Breath sounds: Diffuse wheezing but  improved Abdominal:     General: Bowel sounds are normal.     Palpations: Abdomen is soft.  Musculoskeletal:        General: Normal range of motion.     Cervical back: Normal range of motion and neck supple.  Skin:    General: Skin is warm and dry.  Neurological:     General: No focal deficit present.     Mental Status: He is alert.  Psychiatric:        Mood and Affect: Mood normal.        Behavior: Behavior normal.   Data Reviewed: Labs reviewed.  Within normal limits There are no new results to review at this time.  Family Communication: Plan of care discussed with patient at the bedside  Disposition: Status is: Inpatient Remains inpatient appropriate because: Continues have an increased oxygen requirement  Planned Discharge Destination: Home    Time spent: 33 minutes  Author: Lucile Shutters, MD 07/05/2023 2:34 PM  For on call review [email or web address].

## 2023-07-05 NOTE — TOC Progression Note (Signed)
Transition of Care [facility]) - Progression Note    Patient Details  Name: Kenneth Benson MRN: [id number] Date of Birth: 08-14-1961  Transition of Care [facility]) CM/SW Contact  Darolyn Rua, [place] Phone Number: 07/05/2023, 12:17 PM  Clinical Narrative:     8/7:  CSW met with patient at bedside to review recommendations for snf, reports he feels okay to go home states all he needs is a cane, declined RW. Patient reports he does not wear O2 at home as he cannot afford it, and has problems sometimes affording his $4 co pay for medications. ToC will follow for discharge planning needs.    8/6:   CSW received message from [facility] secretary stating that  patient's wife ([name]) left a message this morning stating the patient was supposed to be in court today and is requesting [place] to fax a statement to them about being at the hospital and unable to appear today. Left [phone] as the number to reach her by.    CSW called [name] back, lvm, CSW spoke with [facility] supervisor regarding this request.    Per [facility] supervisor MD would have to sign a note and we can fax it to the attorney. MD made aware.    Disability resources added to AVS if patient is interested in disability he will have to go to    Phone:[phone]   [email or web address]   Or Visit:   [address] [place], [place] [address]     Barriers to Discharge: Continued Medical Work up  Expected Discharge Plan and Services     Post Acute Care Choice: Home Health, Skilled Nursing Facility Living arrangements for the past 2 months: Single Family Home                                       Social Determinants of Health (SDOH) Interventions SDOH Screenings   Food Insecurity: No Food Insecurity (07/03/2023)  Housing: Low Risk  (07/03/2023)  Transportation Needs: No Transportation Needs (07/03/2023)  Utilities: Not At Risk (07/03/2023)  Tobacco Use: High Risk (07/03/2023)     Readmission Risk Interventions     No data to display

## 2023-07-05 NOTE — Progress Notes (Signed)
[facility] CLINIC CARDIOLOGY CONSULT NOTE       Patient ID: Kenneth Benson MRN: [id number] DOB/AGE: January 02, 1961 62 y.o.  Admit date: 07/02/2023 Referring Physician Dr. Joylene Igo Primary Physician none Primary Cardiologist previous Dr. 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  Cardiology is consulted for further assistance.  Interval History:  -Feels a little bit better today, but not close to baseline -Continues to feel short of breath with pleuritic chest discomfort -Pedal edema resolved. No palpitations - HR remains elevated in the 110s-130s in AF on tele  Review of systems complete and found to be negative unless listed above     Past Medical History:  Diagnosis Date   A-fib (HCC) 02/06/2022   AKI (acute kidney injury) (HCC) 12/17/2022   Arrhythmia    atrial fibrillation   Asthma    CHF (congestive heart failure) (HCC)    COPD (chronic obstructive pulmonary disease) (HCC)    Hyperkalemia 12/17/2022   Hypokalemia 04/13/2023   Myocardial injury 04/13/2023   Tobacco abuse     History reviewed. No pertinent surgical history.  Medications Prior to Admission  Medication Sig Dispense Refill Last Dose   albuterol (VENTOLIN HFA) 108 (90 Base) MCG/ACT inhaler Inhale 2 puffs into the lungs every 6 (six) hours as needed for wheezing or shortness of breath. 6.7 g 2 prn   apixaban (ELIQUIS) 5 MG TABS tablet Take 1 tablet (5 mg total) by mouth 2 (two) times daily. 60 tablet 1 07/02/2023 at 0730   JARDIANCE 10 MG TABS tablet Take 10 mg by mouth daily.   07/02/2023 at 0730   losartan (COZAAR) 25 MG tablet Take 0.5 tablets (12.5 mg total) by mouth daily. 15 tablet 1 07/02/2023 at 0730   metoprolol tartrate (LOPRESSOR) 50 MG tablet Take 1 tablet (50 mg total) by mouth 2 (two) times daily.  60 tablet 1 07/02/2023 at 0730   torsemide (DEMADEX) 20 MG tablet Take 2 tablets (40 mg total) by mouth daily. 60 tablet 1 07/02/2023 at 0730   albuterol (PROVENTIL) (2.5 MG/3ML) 0.083% nebulizer solution Take 3 mLs (2.5 mg total) by nebulization every 6 (six) hours as needed for wheezing or shortness of breath. (Patient not taking: Reported on 07/02/2023) 720 mL 0 Not Taking   arformoterol (BROVANA) 15 MCG/2ML NEBU Take 2 mLs (15 mcg total) by nebulization 2 (two) times daily. (Patient not taking: Reported on 05/11/2023) 120 mL 1    budesonide (PULMICORT) 0.5 MG/2ML nebulizer solution Take 2 mLs (0.5 mg total) by nebulization 2 (two) times daily. (Patient not taking: Reported on 05/11/2023) 120 mL 1    diltiazem (CARDIZEM CD) 360 MG 24 hr capsule Take 1 capsule (360 mg total) by mouth daily. (Patient not taking: Reported on 07/02/2023) 30 capsule 1 Not Taking   Social History   Socioeconomic History   Marital status: Single    Spouse name: Not on file   Number of children: Not on file   Years of education: Not on file   Highest education level: Not on file  Occupational History   Not on file  Tobacco Use   Smoking status: Every Day    Current packs/day: 1.50    Average packs/day: 1.5 packs/day for 27.0 years (40.5 ttl pk-yrs)    Types: Cigarettes   Smokeless  tobacco: Not on file   Tobacco comments:    0.5PPD at the most.  trying to quit.  05/11/2023 hfb  Vaping Use   Vaping status: Never Used  Substance and Sexual Activity   Alcohol use: No   Drug use: No   Sexual activity: Not on file  Other Topics Concern   Not on file  Social History Narrative   Not on file   Social Determinants of Health   Financial Resource Strain: Not on file  Food Insecurity: No Food Insecurity (07/03/2023)   Hunger Vital Sign    Worried About Running Out of Food in the Last Year: Never true    Ran Out of Food in the Last Year: Never true  Transportation Needs: No Transportation Needs (07/03/2023)   PRAPARE -  Administrator, Civil Service (Medical): No    Lack of Transportation (Non-Medical): No  Physical Activity: Not on file  Stress: Not on file  Social Connections: Not on file  Intimate Partner Violence: Not At Risk (07/03/2023)   Humiliation, Afraid, Rape, and Kick questionnaire    Fear of Current or Ex-Partner: No    Emotionally Abused: No    Physically Abused: No    Sexually Abused: No    Family History  Problem Relation Age of Onset   Heart disease Mother    Atrial fibrillation Father       Intake/Output Summary (Last 24 hours) at 07/05/2023 0828 Last data filed at 07/05/2023 0316 Gross per 24 hour  Intake 954.08 ml  Output 2650 ml  Net -1695.92 ml    Vitals:   07/05/23 0302 07/05/23 0326 07/05/23 0744 07/05/23 0805  BP:  98/75  107/77  Pulse:  88  96  Resp:  16  18  Temp:  98.2 F (36.8 C)  98.3 F (36.8 C)  TempSrc:    Oral  SpO2: 90% 95% 92% 97%  Weight:      Height:        PHYSICAL EXAM General: middle aged male , well nourished, in no acute distress. Laying at angle in PCU bed HEENT:  Normocephalic and atraumatic. Neck:  No JVD.  Lungs: slight conversational dyspnea on 3L by [place]. Poor air movement, diffuse expiratory wheezing with prolonged expiratory phase, no appreciable crackles  Heart: tachy irregular irregular. Normal S1 and S2 without gallops or murmurs.  Abdomen: Non-distended appearing.  Msk: Normal strength and tone for age. Extremities: Warm and well perfused. No clubbing, cyanosis. No LE edema.  Neuro: Alert and oriented X 3. Psych:  Answers questions appropriately.   Labs: Basic Metabolic Panel: Recent Labs    07/03/23 0936 07/04/23 0623 07/05/23 0555  NA 137 137 140  K 3.9 4.4 3.7  CL 95* 98 98  CO2 30 30 35*  GLUCOSE 176* 159* 92  BUN 29* 31* 33*  CREATININE 1.16 1.11 1.00  CALCIUM 8.8* 8.7* 8.4*  MG 2.0  --   --    Liver Function Tests: Recent Labs    07/02/23 1821  AST 16  ALT 17  ALKPHOS 40  BILITOT 0.9  PROT  6.9  ALBUMIN 3.9   No results for input(s): "LIPASE", "AMYLASE" in the last 72 hours. CBC: Recent Labs    07/04/23 0623 07/05/23 0555  WBC 13.3* 10.0  HGB 14.6 14.3  HCT 46.0 46.3  MCV 87.8 90.1  PLT 227 190   Cardiac Enzymes: Recent Labs    07/02/23 1730 07/04/23 1422 07/04/23 1620  TROPONINIHS 13 12  11   BNP: Recent Labs    07/02/23 1543  BNP 661.0*   D-Dimer: No results for input(s): "DDIMER" in the last 72 hours. Hemoglobin A1C: No results for input(s): "HGBA1C" in the last 72 hours. Fasting Lipid Panel: No results for input(s): "CHOL", "HDL", "LDLCALC", "TRIG", "CHOLHDL", "LDLDIRECT" in the last 72 hours. Thyroid Function Tests: No results for input(s): "TSH", "T4TOTAL", "T3FREE", "THYROIDAB" in the last 72 hours.  Invalid input(s): "FREET3" Anemia Panel: No results for input(s): "VITAMINB12", "FOLATE", "FERRITIN", "TIBC", "IRON", "RETICCTPCT" in the last 72 hours.   Radiology: [facility] Chest Port 1 View  Result Date: 07/04/2023 CLINICAL DATA:  Shortness of breath.  Rapid response called. EXAM: PORTABLE CHEST 1 VIEW COMPARISON:  07/02/2023 FINDINGS: Single AP view of the chest demonstrates enlargement of the cardiac silhouette. Central vascular structures are mildly prominent but no overt pulmonary edema. Negative for a pneumothorax. No focal airspace disease or lung consolidation. The trachea is midline. IMPRESSION: Enlargement of the cardiac silhouette without overt pulmonary edema. Electronically Signed   By: Richarda Overlie M.D.   On: 07/04/2023 15:12   DG HIP PORT UNILAT WITH PELVIS 1V LEFT  Result Date: 07/04/2023 CLINICAL DATA:  Chronic left hip pain. EXAM: DG HIP (WITH OR WITHOUT PELVIS) 1V PORT LEFT COMPARISON:  None Available. FINDINGS: Mild-to-moderate left and mild right femoroacetabular joint space narrowing. Mild to moderate bilateral femoral head-neck junction degenerative osteophytosis. Mild bilateral superolateral acetabular degenerative osteophytosis. Mild  bilateral sacroiliac subchondral sclerosis. The pubic symphysis joint space is maintained. No acute fracture or dislocation. 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 Electronically Signed   By: Neita Garnet M.D.   On: 07/04/2023 11:58   CT Angio Chest Pulmonary Embolism (PE) W or WO Contrast  Result Date: 07/02/2023 CLINICAL DATA:  Audible wheezing. EXAM: CT ANGIOGRAPHY CHEST WITH CONTRAST TECHNIQUE: Multidetector CT imaging of the chest was performed using the standard protocol during bolus administration of intravenous contrast. Multiplanar CT image reconstructions and MIPs were obtained to evaluate the vascular anatomy. RADIATION DOSE REDUCTION: This exam was performed according to the departmental dose-optimization program which includes automated exposure control, adjustment of the mA and/or kV according to patient size and/or use of iterative reconstruction technique. CONTRAST:  OMNIPAQUE IOHEXOL 350 MG/ML SOLN COMPARISON:  Apr 13, 2023 FINDINGS: Cardiovascular: Thoracic aorta is normal in appearance. Satisfactory opacification of the pulmonary arteries to the segmental level. No evidence of pulmonary embolism. There is mild cardiomegaly with mild coronary artery calcification. No pericardial effusion. Mediastinum/Nodes: No enlarged mediastinal, hilar, or axillary lymph nodes. Thyroid gland, trachea, and esophagus demonstrate no significant findings. Lungs/Pleura: Mild lingular, right middle lobe and bibasilar atelectasis is seen. A stable 4 mm left upper lobe lung nodule is noted. There is no evidence of acute infiltrate, pleural effusion or pneumothorax. Upper Abdomen: A punctate gallstone is seen within an otherwise normal-appearing gallbladder. Musculoskeletal: No chest wall abnormality. No acute or significant osseous findings. Review of the MIP images confirms the above findings. IMPRESSION: 1. No evidence of pulmonary embolism. 2. Mild lingular,  right middle lobe and bibasilar atelectasis. 3. Stable 4 mm left upper lobe pulmonary nodule. No follow-up needed if patient is low-risk.This recommendation follows the consensus statement: Guidelines for Management of Incidental Pulmonary Nodules Detected on CT Images: From the Fleischner Society 2017; Radiology 2017; 284:228-243. 4. Cholelithiasis. Electronically Signed   By: Aram Candela M.D.   On: 07/02/2023 21:16   DG Chest 2 View  Result Date: 07/02/2023 CLINICAL DATA:  Shortness of breath.  EXAM: CHEST - 2 VIEW COMPARISON:  Chest x-ray dated Apr 16, 2023. FINDINGS: Unchanged cardiomegaly. Normal pulmonary vascularity. Mild streaky opacities at both lung bases consistent with atelectasis. No focal consolidation, pleural effusion, or pneumothorax. No acute osseous abnormality. IMPRESSION: Mild bibasilar atelectasis. Electronically Signed   By: Obie Dredge M.D.   On: 07/02/2023 15:00    ECHO 12/18/2022  1. Left ventricular ejection fraction, by estimation, is 45 to 50%. The  left ventricle has mildly decreased function. The left ventricle has no  regional wall motion abnormalities. The left ventricular internal cavity  size was mildly dilated. Left  ventricular diastolic parameters were normal.   2. Right ventricular systolic function is normal. The right ventricular  size is normal. There is mildly elevated pulmonary artery systolic  pressure.   3. The mitral valve is normal in structure. Moderate mitral valve  regurgitation. No evidence of mitral stenosis.   4. Tricuspid valve regurgitation is moderate.   5. The aortic valve is normal in structure. Aortic valve regurgitation is  not visualized. No aortic stenosis is present.   6. The inferior vena cava is normal in size with greater than 50%  respiratory variability, suggesting right atrial pressure of 3 mmHg.    TELEMETRY reviewed by me (LT) 07/05/2023 : AF RVR rate 110s-120  EKG reviewed by me: AF frequent PVC rate 122  Data  reviewed by me (LT) 07/05/2023:  admission H&P last 24h vitals tele labs imaging I/O    Principal Problem:   Acute hypoxic respiratory failure (HCC) Active Problems:   COPD (chronic obstructive pulmonary disease) (HCC)   Cigarette smoker   Paroxysmal atrial fibrillation with RVR (HCC)   Acute on chronic combined systolic and diastolic CHF (congestive heart failure) (HCC)   HTN (hypertension)    ASSESSMENT AND PLAN:  Kenneth Benson is a 52yoM with a PMH of asthma, COPD, ongoing tobacco use, 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  Cardiology is consulted for further assistance.  # Acute hypoxic respiratory failure # COPD exacerbation Diffuse wheezing on exam today, on IV solumedrol and duonebs per primary team   # Paroxysmal atrial fibrillation with RVR Likely provoked in the setting of COPD exacerbation vs running out of home diltiazem and inability to afford refills.  Rate remains elevated in the 110s-130s today - stop IV diltizem with borderline low BP  - stop amiodarone IV. Not a great long term medication with underlying COPD/asthma - continue metoprolol tartrate 25mg  q6h for rate control  - restart PO diltiazem 30mg  q6h - continue eliquis 5mg  BID   # acute on chronic HFmrEF Not significantly clinically hypervolemic on exam, BNP 600s, trace peripheral edema - s/p 60mg  IV lasix x 1 with resolution in peripheral edema.  - on torsemide 40mg  daily  - start spiro 12.5mg  daily - continue jardiance 10mg  daily   # tobacco abuse Encouraged complete cessation.   This patient's plan of care was discussed and created with Dr. Melton Alar and she is in agreement.  Signed: Rebeca Allegra , PA-C 07/05/2023, 8:28 AM [facility] Cardiology

## 2023-07-05 NOTE — Progress Notes (Addendum)
[facility] HF Stewardship  PCP: Pcp, No  PCP-Cardiologist: None  HPI: Kenneth Benson is a 62 y.o. male with COPD, Afib, CHF, HTN who presented with shortness of breath.   ECHO 12/2021 showed normal LVEF. Most recent ECHO 11/2022 showed LVEF reduced to 45-50% with moderate MR, moderate TR, and normal diastolic function. Admitted 03/2023 with COPD exacerbation.   Now admitted with combined CHF and COPD exacerbations. Initial presentation with pitting edema, elevated JVP, elevated BNP, and rales per MD. Stay complicated by AF with elevated ventricular rate. PTA losartan and metoprolol held due to hypotension. Initial diuresis with IV Lasix, now transitioned to torsemide. Rapid response called on 07/04/23 for tachycardia and increased work of breathing.  Pertinent Lab Values: BUN  Date Value Ref Range Status  07/05/2023 33 (H) 6 - 20 mg/dL Final   Potassium  Date Value Ref Range Status  07/05/2023 3.7 3.5 - 5.1 mmol/L Final   Sodium  Date Value Ref Range Status  07/05/2023 140 135 - 145 mmol/L Final   B Natriuretic Peptide  Date Value Ref Range Status  07/02/2023 661.0 (H) 0.0 - 100.0 pg/mL Final    Comment:    Performed at [facility], [address] [place], [place] [address]   TSH  Date Value Ref Range Status  12/16/2022 1.505 0.350 - 4.500 uIU/mL Final    Comment:    Performed by a 3rd Generation assay with a functional sensitivity of <=0.01 uIU/mL. Performed at [facility], [address] [place], [place] [address]     Vital Signs: Temp:  [97.1 F (36.2 C)-98.4 F (36.9 C)] 98.3 F (36.8 C) (08/07 0805) Pulse Rate:  [56-130] 96 (08/07 0805) Cardiac Rhythm: Atrial fibrillation (08/06 1900) Resp:  [16-32] 18 (08/07 0805) BP: (83-127)/(66-110) 107/77 (08/07 0805) SpO2:  [90 %-97 %] 97 % (08/07 0805)   Intake/Output Summary (Last 24 hours) at 07/05/2023 0825 Last data filed at 07/05/2023 0316 Gross per 24 hour  Intake 954.08 ml  Output 2925 ml  Net  -1970.92 ml    Current HF Medications:  Jardiance 10 mg daily Torsemide 40 mg daily  Prior to admission HF Medications:  Metoprolol tartrate 50 mg BID Jardiance 10 mg daily Losartan 12.5 mg daily Torsemide 40 mg daily  Assessment: 1. 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 NYHA class II-III symptoms.   -SBP remains labile, ranging from 120s-90s -Unable to tolerate further GDMT titration at this time. Given excellent renal function, K <4, and softer BP, spironolactone would be ideal to add first. -Significant improvement in peripheral edema. Breathing slowly improving per patient report. Continue current diuretic dose. -PTA diltiazem should be cautioned with HFmrEF. Can consider stopping at discharge pending rate.  Plan: 1) Medication changes recommended at this time: - Maintain strict I/Os, daily weights, Mg >2, and K >4. - Consider spironolactone 12.5 mg daily today if BP stable.  2) Patient assistance: -None required  3) Education: - Patient has been educated on current HF medications and potential additions to HF medication regimen - Patient verbalizes understanding that over the next few months, these medication doses may change and more medications may be added to optimize HF regimen - Patient has been educated on basic disease state pathophysiology and goals of therapy   Medication Assistance / Insurance Benefits Check:  Does the patient have prescription insurance?  Yes  Type of insurance plan: [place] Medicaid  Does the patient qualify for medication assistance through manufacturers or  grants? No   Entresto copay: $4  Jardiance copay: $4  Outpatient Pharmacy:  Prior to admission [facility]  Is the patient willing to use [facility] pharmacy at discharge? Yes  Is the patient willing to transition their outpatient pharmacy to utilize a [facility] outpatient pharmacy?   Yes. Would like to start mail order from [facility]  Thank you for involving pharmacy in this patient's care.  Enos Fling, PharmD, BCPS Clinical Pharmacist 07/05/2023 11:09 AM

## 2023-07-05 NOTE — Evaluation (Signed)
Occupational Therapy Evaluation Patient Details Name: PATTERSON PLOTT MRN: [id number] DOB: 13-Jul-1961 Today's Date: 07/05/2023   History of Present Illness Pt is a 62 year old male admitted with acute hypoxic respiratory failure secondary to COPD exacerbation, a fib;     PMH significant for CHF with EF of 45-50%, hypertension, COPD/asthma, atrial fibrillation on Eliquis, obesity, tobacco abuse   Clinical Impression   Chart reviewed, pt greeted in chair, agreeable to OT evaluation. Pt is alert and oriented x4. PTA pt is generally MOD I-I IN ADL/IADL, does not drive/work at this time, amb with no AD. Pt presents with deficits in activity tolerance affecting safe and optimal ADL completion. Pt will benefit from skilled OT to address functional deficits and return to PLOF.    Of Note: HR up to 133 with mobility in the room (amb about 6 ft forward and back), spo2 87% on 2 L with mobility, resting around 127 on tele with spo2 >90% on 2 L after approx 30 seconds       If plan is discharge home, recommend the following: Assistance with cooking/housework;Assist for transportation;Help with stairs or ramp for entrance    Functional Status Assessment  Patient has had a recent decline in their functional status and demonstrates the ability to make significant improvements in function in a reasonable and predictable amount of time.  Equipment Recommendations  None recommended by OT    Recommendations for Other Services       Precautions / Restrictions Precautions Precautions: Fall Restrictions Weight Bearing Restrictions: No      Mobility Bed Mobility               General bed mobility comments: NT in recliner pre/post session    Transfers Overall transfer level: Needs assistance Equipment used: None Transfers: Sit to/from Stand Sit to Stand: Contact guard assist                  Balance Overall balance assessment: Needs assistance Sitting-balance support: No upper  extremity supported, Feet supported Sitting balance-Leahy Scale: Normal     Standing balance support: No upper extremity supported Standing balance-Leahy Scale: Good                             ADL either performed or assessed with clinical judgement   ADL Overall ADL's : Needs assistance/impaired Eating/Feeding: Sitting;Set up   Grooming: Wash/dry face;Sitting;Set up   Upper Body Bathing: Set up;Sitting   Lower Body Bathing: Minimal assistance Lower Body Bathing Details (indicate cue type and reason): anticipate     Lower Body Dressing: Minimal assistance Lower Body Dressing Details (indicate cue type and reason): education for energy conservation techniques, use of AE Toilet Transfer: Contact guard assist Toilet Transfer Details (indicate cue type and reason): simulated         Functional mobility during ADLs: Contact guard assist;Supervision/safety;Rolling walker (2 wheels) (approx 6' forward and back)       Vision Patient Visual Report: No change from baseline       Perception         Praxis         Pertinent Vitals/Pain Pain Assessment Pain Assessment: 0-10 Pain Score: 4  Pain Location: B knees/hips/lower back Pain Descriptors / Indicators: Discomfort Pain Intervention(s): Limited activity within patient's tolerance, Repositioned, Monitored during session     Extremity/Trunk Assessment Upper Extremity Assessment Upper Extremity Assessment: Overall WFL for tasks assessed   Lower Extremity Assessment  Lower Extremity Assessment: Overall WFL for tasks assessed   Cervical / Trunk Assessment Cervical / Trunk Assessment: Normal   Communication Communication Communication: No apparent difficulties Cueing Techniques: Verbal cues   Cognition Arousal: Alert Behavior During Therapy: WFL for tasks assessed/performed Overall Cognitive Status: Within Functional Limits for tasks assessed                                        General Comments  Pt HR fluctuated between 109-135 with bed mobility, sitting EOB, and standing. HR momentarily peaked at 143 during ambulation but quickly returned to <130 in sitting.    Exercises Other Exercises Other Exercises: edu re: role of OT, role of rehab, energy conservation techniques, safe ADL completion   Shoulder Instructions      Home Living Family/patient expects to be discharged to:: Private residence Living Arrangements: Non-relatives/Friends;Spouse/significant other Available Help at Discharge: Friend(s) Type of Home: Mobile home Home Access: Stairs to enter Secretary/administrator of Steps: 5 Entrance Stairs-Rails: Can reach both;Left;Right Home Layout: One level     Bathroom Shower/Tub:  (pt does not have running water so he reports he showers at a public showering facility every few days; goes to the bathroom outside)         Home Equipment: Other (comment) Lexicographer)          Prior Functioning/Environment Prior Level of Function : Independent/Modified Independent             Mobility Comments: amb household/community distances with no AD; reports hip/knee pain ADLs Comments: generally MOD I with ADL/IADL, does not drive or work        OT Problem List: Decreased activity tolerance;Decreased knowledge of use of DME or AE      OT Treatment/Interventions: Self-care/ADL training;Balance training;Therapeutic exercise;DME and/or AE instruction;Therapeutic activities;Patient/family education    OT Goals(Current goals can be found in the care plan section) Acute Rehab OT Goals Patient Stated Goal: take a shower OT Goal Formulation: With patient Time For Goal Achievement: 07/19/23 Potential to Achieve Goals: Good ADL Goals Pt Will Perform Grooming: with modified independence Pt Will Perform Lower Body Dressing: with modified independence Pt Will Transfer to Toilet: with modified independence Pt Will Perform Toileting - Clothing Manipulation and  hygiene: with modified independence  OT Frequency: Min 1X/week    Co-evaluation              AM-PAC OT "6 Clicks" Daily Activity     Outcome Measure Help from another person eating meals?: None Help from another person taking care of personal grooming?: None Help from another person toileting, which includes using toliet, bedpan, or urinal?: A Little Help from another person bathing (including washing, rinsing, drying)?: A Little Help from another person to put on and taking off regular upper body clothing?: None Help from another person to put on and taking off regular lower body clothing?: A Little 6 Click Score: 21   End of Session Equipment Utilized During Treatment: Oxygen Nurse Communication: Mobility status (vital signs)  Activity Tolerance: Patient tolerated treatment well Patient left: in chair;with call bell/phone within reach;with chair alarm set  OT Visit Diagnosis: Unsteadiness on feet (R26.81)                Time: 1135-1150 OT Time Calculation (min): 15 min Charges:  OT General Charges $OT Visit: 1 Visit OT Evaluation $OT Eval Low Complexity: 1 Low  Tobi Bastos  , OTD OTR/L  07/05/23, 1:00 PM

## 2023-07-05 NOTE — Progress Notes (Signed)
PULMONOLOGY         Date: 07/05/2023,   MRN# [id number] Kenneth Benson November 20, 1961     AdmissionWeight: 122.5 kg                 CurrentWeight: 128.4 kg  Referring provider: Dr Joylene Igo   CHIEF COMPLAINT:   Severe acute exacerbation of COPD   HISTORY OF PRESENT ILLNESS   62 year old male with history of A-fib, AKI, dysrhythmia, chronic asthma, CHF, lifelong smoking history with COPD and chronic hypoxemia, previous MRI who came in with chief complaint of dyspnea and progressively worsening shortness of breath.  Patient admits to noncompliance with supplemental oxygen at home.  Was found to be in A-fib RVR on presentation.  Required 3 L of oxygen to reach normoxia.  Initial blood work showed elevation of BNP suggestive of cardiac stress.  He was tested for COVID-19 and influenza on admission which were both negative.  His most recent echo shows reduced ejection fraction of 45% PCCM consultation for further evaluation management.   07/05/23-patient is now on 2L/min [place] reports feeling better.  Had bath today shares he's refreshed. Overall partial improvement in past 24hr. He is 2.6L net negative. LE edema is now trace. He is being seen by cardiology. Steroids reduced from solumedrol to Prednisone with daily tapering by 5mg .    PAST MEDICAL HISTORY   Past Medical History:  Diagnosis Date   A-fib (HCC) 02/06/2022   AKI (acute kidney injury) (HCC) 12/17/2022   Arrhythmia    atrial fibrillation   Asthma    CHF (congestive heart failure) (HCC)    COPD (chronic obstructive pulmonary disease) (HCC)    Hyperkalemia 12/17/2022   Hypokalemia 04/13/2023   Myocardial injury 04/13/2023   Tobacco abuse      SURGICAL HISTORY   History reviewed. No pertinent surgical history.   FAMILY HISTORY   Family History  Problem Relation Age of Onset   Heart disease Mother    Atrial fibrillation Father      SOCIAL HISTORY   Social History   Tobacco Use   Smoking status: Every Day     Current packs/day: 1.50    Average packs/day: 1.5 packs/day for 27.0 years (40.5 ttl pk-yrs)    Types: Cigarettes   Tobacco comments:    0.5PPD at the most.  trying to quit.  05/11/2023 hfb  Vaping Use   Vaping status: Never Used  Substance Use Topics   Alcohol use: No   Drug use: No     MEDICATIONS    Home Medication:    Current Medication:  Current Facility-Administered Medications:    acetaminophen (TYLENOL) tablet 650 mg, 650 mg, Oral, Q6H PRN, 650 mg at 07/04/23 0833 **OR** acetaminophen (TYLENOL) suppository 650 mg, 650 mg, Rectal, Q6H PRN, [name], Ekta V, MD   albuterol (PROVENTIL) (2.5 MG/3ML) 0.083% nebulizer solution 2.5 mg, 2.5 mg, Nebulization, Q2H PRN, Gertha Calkin, MD   apixaban (ELIQUIS) tablet 5 mg, 5 mg, Oral, BID, Irena Cords V, MD, 5 mg at 07/05/23 1884   bisacodyl (DULCOLAX) EC tablet 5 mg, 5 mg, Oral, Daily PRN, Gertha Calkin, MD   diltiazem (CARDIZEM) tablet 30 mg, 30 mg, Oral, Q6H, Tang, Lily Michelle, PA-C, 30 mg at 07/05/23 1313   docusate sodium (COLACE) capsule 100 mg, 100 mg, Oral, BID, Irena Cords V, MD, 100 mg at 07/05/23 0826   empagliflozin (JARDIANCE) tablet 10 mg, 10 mg, Oral, Daily, Agbata, Tochukwu, MD, 10 mg at 07/05/23 [id number]  guaiFENesin (MUCINEX) 12 hr tablet 600 mg, 600 mg, Oral, BID PRN, Gertha Calkin, MD   hydrALAZINE (APRESOLINE) injection 5 mg, 5 mg, Intravenous, Q4H PRN, Gertha Calkin, MD   HYDROcodone-acetaminophen (NORCO/VICODIN) 5-325 MG per tablet 1-2 tablet, 1-2 tablet, Oral, Q4H PRN, Gertha Calkin, MD   insulin aspart (novoLOG) injection 0-15 Units, 0-15 Units, Subcutaneous, TID WC, Agbata, Tochukwu, MD, 2 Units at 07/05/23 1313   ipratropium (ATROVENT) nebulizer solution 0.5 mg, 0.5 mg, Nebulization, Q6H, Agbata, Tochukwu, MD, 0.5 mg at 07/05/23 1359   levalbuterol (XOPENEX) nebulizer solution 0.63 mg, 0.63 mg, Nebulization, Q6H, Agbata, Tochukwu, MD, 0.63 mg at 07/05/23 1359   methylPREDNISolone sodium succinate (SOLU-MEDROL)  40 mg/mL injection 40 mg, 40 mg, Intravenous, Q24H, , , MD, 40 mg at 07/05/23 0601   metoprolol tartrate (LOPRESSOR) tablet 25 mg, 25 mg, Oral, Q6H, Tang, Lily Michelle, PA-C, 25 mg at 07/05/23 1313   morphine (PF) 2 MG/ML injection 2 mg, 2 mg, Intravenous, Q2H PRN, [name], Eliezer Mccoy, MD   nicotine (NICODERM CQ - dosed in mg/24 hours) patch 14 mg, 14 mg, Transdermal, Daily, Irena Cords V, MD, 14 mg at 07/02/23 2001   ondansetron (ZOFRAN) tablet 4 mg, 4 mg, Oral, Q6H PRN **OR** ondansetron (ZOFRAN) injection 4 mg, 4 mg, Intravenous, Q6H PRN, [name], Ekta V, MD   polyethylene glycol (MIRALAX / GLYCOLAX) packet 17 g, 17 g, Oral, Daily PRN, Irena Cords V, MD   sodium chloride flush (NS) 0.9 % injection 3 mL, 3 mL, Intravenous, Q12H, Irena Cords V, MD, 3 mL at 07/05/23 0827   spironolactone (ALDACTONE) tablet 12.5 mg, 12.5 mg, Oral, Daily, Tang, Lily Michelle, PA-C, 12.5 mg at 07/05/23 0956   torsemide (DEMADEX) tablet 40 mg, 40 mg, Oral, Daily, Agbata, Tochukwu, MD, 40 mg at 07/05/23 1308    ALLERGIES   Patient has no known allergies.     REVIEW OF SYSTEMS    Review of Systems:  Gen:  Denies  fever, sweats, chills weigh loss  HEENT: Denies blurred vision, double vision, ear pain, eye pain, hearing loss, nose bleeds, sore throat Cardiac:  No dizziness, chest pain or heaviness, chest tightness,edema Resp:   reports dyspnea chronically  Gi: Denies swallowing difficulty, stomach pain, nausea or vomiting, diarrhea, constipation, bowel incontinence Gu:  Denies bladder incontinence, burning urine Ext:   Denies Joint pain, stiffness or swelling Skin: Denies  skin rash, easy bruising or bleeding or hives Endoc:  Denies polyuria, polydipsia , polyphagia or weight change Psych:   Denies depression, insomnia or hallucinations   Other:  All other systems negative   VS: BP 104/75 (BP Location: Right Arm)   Pulse (!) 106   Temp 98.3 F (36.8 C) (Oral)   Resp (!) 22   Ht 6' (1.829 m)    Wt 128.4 kg   SpO2 95%   BMI 38.40 kg/m      PHYSICAL EXAM    GENERAL:NAD, no fevers, chills, no weakness no fatigue HEAD: Normocephalic, atraumatic.  EYES: Pupils equal, round, reactive to light. Extraocular muscles intact. No scleral icterus.  MOUTH: Moist mucosal membrane. Dentition intact. No abscess noted.  EAR, NOSE, THROAT: Clear without exudates. No external lesions.  NECK: Supple. No thyromegaly. No nodules. No JVD.  PULMONARY: decreased breath sounds with mild rhonchi worse at bases bilaterally.  CARDIOVASCULAR: S1 and S2. Regular rate and rhythm. No murmurs, rubs, or gallops. No edema. Pedal pulses 2+ bilaterally.  GASTROINTESTINAL: Soft, nontender, nondistended. No masses. Positive bowel sounds. No hepatosplenomegaly.  MUSCULOSKELETAL: No swelling, clubbing, or edema. Range of motion full in all extremities.  NEUROLOGIC: Cranial nerves II through XII are intact. No gross focal neurological deficits. Sensation intact. Reflexes intact.  SKIN: No ulceration, lesions, rashes, or cyanosis. Skin warm and dry. Turgor intact.  PSYCHIATRIC: Mood, affect within normal limits. The patient is awake, alert and oriented x 3. Insight, judgment intact.       IMAGING      ASSESSMENT/PLAN   Acute on chronic hypoxemic respiratory failure -there is interstitial edema, atelectasis bilaterally and cardiomegaly.  -he reports being off his medications due to cost prohibitive pricing.  -patient states he requires financial assistance.  -he states he stopped taking his diuretics and cardiac medications -he appears improved from cardiac dysfunction perspective  -would diurese first and recruit lungs with IS and metaneb -he needs PT/OT    Tobacco abuse   - patient continues to smoke actively   Acute on chronic systolic CHF with EF <45% - S/p lasix 60mg  and now on torsemide 40mg  -patient shares he is able to urinate adequately without issues -he has AF and is on metoprolol and this  makes diuresis more complicated.        Thank you for allowing me to participate in the care of this patient.   Patient/Family are satisfied with care plan and all questions have been answered.    Provider disclosure: Patient with at least one acute or chronic illness or injury that poses a threat to life or bodily function and is being managed actively during this encounter.  All of the below services have been performed independently by signing provider:  review of prior documentation from internal and or external health records.  Review of previous and current lab results.  Interview and comprehensive assessment during patient visit today. Review of current and previous chest radiographs/CT scans. Discussion of management and test interpretation with health care team and patient/family.   This document was prepared using Dragon voice recognition software and may include unintentional dictation errors.     [name], M.D.  Division of Pulmonary & Critical Care Medicine

## 2023-07-05 NOTE — Evaluation (Addendum)
Physical Therapy Evaluation Patient Details Name: Kenneth Benson MRN: [id number] DOB: 04-Dec-1960 Today's Date: 07/05/2023  History of Present Illness  Pt is a 62y/o male presenting due to acute exacerbation of COPD and hypoxic respiratory failure. PMH inlcudes Afib, AKI, CHF, smoking, COPD, MI, chronic asthma, and obesity. Pt began amiodarone drip 8/5.  Clinical Impression   Pt presents in bed, some pain in hips. He currently lives with 2 roommates in a trailor with 5 steps to enter and B/L rails. PTA he was having difficulty ambulating around home due to b/l hip pain, did not use AD but felt like he should have. He was also independent with ADLs, but roommate would occasionally help if limited by pain levels.   Pt able to perform supine>sit with supervision for safety. Pt also tolerated sit<>stand and ambulating ~60ft with RW and CGA. Pt HR fluctuated between 109-135 with bed mobility, EOB sitting/therex, and standing. HR momentarily peaked at 143 during ambulation but quickly returned to <130 in sitting. SPO2 remained >90% throughout session on 2L O2. Pt would benefit from continued skilled therapy to maximize functional abilities and activity tolerance.       If plan is discharge home, recommend the following: A little help with walking and/or transfers;A little help with bathing/dressing/bathroom;Assistance with cooking/housework;Direct supervision/assist for financial management;Assist for transportation;Help with stairs or ramp for entrance;Direct supervision/assist for medications management   Can travel by private vehicle   No    Equipment Recommendations Rolling walker (2 wheels)  Recommendations for Other Services       Functional Status Assessment Patient has had a recent decline in their functional status and demonstrates the ability to make significant improvements in function in a reasonable and predictable amount of time.     Precautions / Restrictions  Precautions Precautions: Fall Restrictions Weight Bearing Restrictions: No      Mobility  Bed Mobility Overal bed mobility: Needs Assistance Bed Mobility: Supine to Sit     Supine to sit: Supervision     General bed mobility comments: supervision for safety    Transfers Overall transfer level: Needs assistance Equipment used: Rolling walker (2 wheels) Transfers: Sit to/from Stand Sit to Stand: Contact guard assist           General transfer comment: CGA for safety    Ambulation/Gait Ambulation/Gait assistance: Contact guard assist Gait Distance (Feet): 20 Feet Assistive device: Rolling walker (2 wheels) Gait Pattern/deviations: Trunk flexed, Step-through pattern Gait velocity: decreased        Stairs            Wheelchair Mobility     Tilt Bed    Modified Rankin (Stroke Patients Only)       Balance Overall balance assessment: Needs assistance Sitting-balance support: No upper extremity supported, Feet supported Sitting balance-Leahy Scale: Normal       Standing balance-Leahy Scale: Good Standing balance comment: relies on RW for upright posturing                             Pertinent Vitals/Pain Pain Assessment Pain Assessment: Faces Faces Pain Scale: Hurts a little bit Pain Location: hip Pain Descriptors / Indicators: Discomfort Pain Intervention(s): Monitored during session    Home Living Family/patient expects to be discharged to:: Private residence Living Arrangements: Non-relatives/Friends (2 roommates) Available Help at Discharge: Friend(s) Type of Home: Mobile home Home Access: Stairs to enter Entrance Stairs-Rails: Can reach both;Left;Right Entrance Stairs-Number of Steps: 5  Home Layout: One level Home Equipment: None      Prior Function Prior Level of Function : Independent/Modified Independent             Mobility Comments: Difficulty ambulating around home due to b/l hip pain, did not use  AD. ADLs Comments: Overall independent with ADLs, but roommate would occasionally help if limited by pain levels     Extremity/Trunk Assessment   Upper Extremity Assessment Upper Extremity Assessment: Overall WFL for tasks assessed    Lower Extremity Assessment Lower Extremity Assessment: Overall WFL for tasks assessed       Communication   Communication Communication: No apparent difficulties  Cognition Arousal: Alert Behavior During Therapy: WFL for tasks assessed/performed Overall Cognitive Status: Within Functional Limits for tasks assessed                                          General Comments General comments (skin integrity, edema, etc.): Pt HR fluctuated between 109-135 with bed mobility, sitting EOB, and standing. HR momentarily peaked at 143 during ambulation but quickly returned to <130 in sitting.    Exercises General Exercises - Lower Extremity Long Arc Quad: AROM, Both, 10 reps, Seated Other Exercises Other Exercises: lateral stepping x4 b/l   Assessment/Plan    PT Assessment Patient needs continued PT services  PT Problem List Decreased strength;Decreased range of motion;Decreased activity tolerance;Decreased balance;Decreased mobility;Decreased safety awareness;Decreased knowledge of use of DME       PT Treatment Interventions Balance training;DME instruction;Gait training;Neuromuscular re-education;Stair training;Functional mobility training;Therapeutic activities;Therapeutic exercise;Patient/family education    PT Goals (Current goals can be found in the Care Plan section)  Acute Rehab PT Goals Patient Stated Goal: return home PT Goal Formulation: With patient Time For Goal Achievement: 07/19/23 Potential to Achieve Goals: Fair    Frequency Min 1X/week     Co-evaluation               AM-PAC PT "6 Clicks" Mobility  Outcome Measure Help needed turning from your back to your side while in a flat bed without using  bedrails?: A Little Help needed moving from lying on your back to sitting on the side of a flat bed without using bedrails?: A Little Help needed moving to and from a bed to a chair (including a wheelchair)?: A Little Help needed standing up from a chair using your arms (e.g., wheelchair or bedside chair)?: A Little Help needed to walk in hospital room?: A Little Help needed climbing 3-5 steps with a railing? : A Lot 6 Click Score: 17    End of Session Equipment Utilized During Treatment: Gait belt Activity Tolerance: Patient tolerated treatment well;Other (comment) (limited by HR response) Patient left: in chair;with call bell/phone within reach;with chair alarm set   PT Visit Diagnosis: Other abnormalities of gait and mobility (R26.89);Difficulty in walking, not elsewhere classified (R26.2)    Time: [id number] (Simultaneous filing. User may not have seen previous data.) PT Time Calculation (min) (ACUTE ONLY): 30 min (Simultaneous filing. User may not have seen previous data.)   Charges:   PT Evaluation $PT Eval Moderate Complexity: 1 Mod PT Treatments $Therapeutic Activity: 23-37 mins PT General Charges $$ ACUTE PT VISIT: 1 Visit         , PT, SPT 12:28 PM,07/05/23

## 2023-07-06 ENCOUNTER — Inpatient Hospital Stay: Payer: Medicaid Other

## 2023-07-06 DIAGNOSIS — J9601 Acute respiratory failure with hypoxia: Secondary | ICD-10-CM | POA: Diagnosis not present

## 2023-07-06 LAB — GLUCOSE, CAPILLARY
Glucose-Capillary: 138 mg/dL — ABNORMAL HIGH (ref 70–99)
Glucose-Capillary: 121 mg/dL — ABNORMAL HIGH (ref 70–99)
Glucose-Capillary: 165 mg/dL — ABNORMAL HIGH (ref 70–99)
Glucose-Capillary: 154 mg/dL — ABNORMAL HIGH (ref 70–99)

## 2023-07-06 MED ORDER — DILTIAZEM HCL 25 MG/5ML IV SOLN
10.0000 mg | Freq: Once | INTRAVENOUS | Status: AC
  Administered 2023-07-06: 10 mg via INTRAVENOUS
  Filled 2023-07-06: qty 5

## 2023-07-06 MED ORDER — ORAL CARE MOUTH RINSE: 15.0000 mL | OROMUCOSAL | Status: DC | PRN

## 2023-07-06 MED ORDER — DILTIAZEM LOAD VIA INFUSION
10.0000 mg | Freq: Once | INTRAVENOUS | Status: DC
  Filled 2023-07-06: qty 10

## 2023-07-06 MED ORDER — METOPROLOL SUCCINATE ER 50 MG PO TB24
50.0000 mg | ORAL_TABLET | Freq: Two times a day (BID) | ORAL | Status: DC
  Administered 2023-07-06 – 2023-07-10 (×7): 50 mg via ORAL
  Filled 2023-07-06 (×8): qty 1

## 2023-07-06 MED ORDER — POTASSIUM CHLORIDE CRYS ER 20 MEQ PO TBCR
40.0000 meq | EXTENDED_RELEASE_TABLET | Freq: Two times a day (BID) | ORAL | Status: AC
  Administered 2023-07-06 (×2): 40 meq via ORAL
  Filled 2023-07-06 (×2): qty 2

## 2023-07-06 NOTE — Progress Notes (Signed)
[facility] HF Stewardship  PCP: Pcp, No  PCP-Cardiologist: None  HPI: Kenneth Benson is a 62 y.o. male with COPD, Afib, CHF, HTN who presented with shortness of breath.   ECHO 12/2021 showed normal LVEF. Most recent ECHO 11/2022 showed LVEF reduced to 45-50% with moderate MR, moderate TR, and normal diastolic function. Admitted 03/2023 with COPD exacerbation.   Now admitted with combined CHF and COPD exacerbations. Initial presentation with pitting edema, elevated JVP, elevated BNP, and rales per MD. Stay complicated by AF with elevated ventricular rate. PTA losartan and metoprolol held due to hypotension. Initial diuresis with IV Lasix, now transitioned to torsemide. Rapid response called on 07/04/23 for tachycardia and increased work of breathing. Steroids have been converted from IV to PO taper.  Pertinent Lab Values: BUN  Date Value Ref Range Status  07/06/2023 32 (H) 6 - 20 mg/dL Final   Potassium  Date Value Ref Range Status  07/06/2023 3.5 3.5 - 5.1 mmol/L Final   Sodium  Date Value Ref Range Status  07/06/2023 139 135 - 145 mmol/L Final   B Natriuretic Peptide  Date Value Ref Range Status  07/02/2023 661.0 (H) 0.0 - 100.0 pg/mL Final    Comment:    Performed at [facility], [address] [place], [place] [address]   TSH  Date Value Ref Range Status  12/16/2022 1.505 0.350 - 4.500 uIU/mL Final    Comment:    Performed by a 3rd Generation assay with a functional sensitivity of <=0.01 uIU/mL. Performed at [facility], [address] [place], [place] [address]     Vital Signs: Temp:  [98.1 F (36.7 C)-98.4 F (36.9 C)] 98.2 F (36.8 C) (08/08 0834) Pulse Rate:  [54-115] 97 (08/08 0834) Cardiac Rhythm: Atrial fibrillation (08/07 2118) Resp:  [17-26] 22 (08/08 0834) BP: (97-108)/(69-86) 101/83 (08/08 0834) SpO2:  [91 %-98 %] 93 % (08/08 0834)   Intake/Output Summary (Last 24 hours) at 07/06/2023 0841 Last data filed at 07/06/2023 0981 Gross per 24  hour  Intake 1860.77 ml  Output 4900 ml  Net -3039.23 ml    Current HF Medications:  Jardiance 10 mg daily Torsemide 40 mg daily  Prior to admission HF Medications:  Metoprolol tartrate 50 mg BID Jardiance 10 mg daily Losartan 12.5 mg daily Torsemide 40 mg daily  Assessment: 1. 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 NYHA class II-III symptoms.   -SBP stabilized in 100s. HR variable. Excellent urine output. Remains on 2L O2. -Unable to tolerate further GDMT titration at this time. Given excellent renal function, K <4, and softer BP, titration of spironolactone would be preferred. -Significant improvement in peripheral edema. Breathing slowly improving per patient report. Continue current diuretic dose with stable creatinine/BUN and symptom improvement. -PTA diltiazem cautioned with HFmrEF. IF HR remains stable, can consider stopping at discharge in favor of beta blocker.  Plan: 1) Medication changes recommended at this time: - Can consider consolidation of metoprolol tartrate to metoprolol succinate if HR remains stable today. - Can consider increasing spironolactone 8/9 if BP stable. - Maintain strict I/Os, daily weights, Mg >2, and K >4.  2) Patient assistance: -None required  3) Education: - Patient has been educated on current HF medications and potential additions to HF medication regimen - Patient verbalizes understanding that over the next few months, these medication doses may change and more medications may be added to optimize HF regimen - Patient has been educated on basic disease state  pathophysiology and goals of therapy   Medication Assistance / Insurance Benefits Check:  Does the patient have prescription insurance?  Yes  Type of insurance plan: [place] Medicaid  Does the patient qualify for medication assistance through manufacturers or grants? No   Entresto copay: $4  Jardiance copay: $4  Outpatient  Pharmacy:  Prior to admission [facility]: [facility]  Is the patient willing to use [facility] at discharge? Yes  Is the patient willing to transition their outpatient pharmacy to utilize a [facility] outpatient pharmacy? Agreeable to switch to [facility]?: Yes. Would like to start mail order from [facility]  Thank you for involving pharmacy in this patient's care.  Enos Fling, PharmD, BCPS Clinical Pharmacist 07/06/2023 8:41 AM

## 2023-07-06 NOTE — Progress Notes (Signed)
OT Cancellation Note  Patient Details Name: Kenneth Benson MRN: [id number] DOB: 08/01/1961   Cancelled Treatment:    Reason Eval/Treat Not Completed: Other (comment) (attempted to see pt for OT, pt leaving his room with patient transport. OT will reattempt as able.Oleta Mouse, OTD OTR/L  07/06/23, 11:45 AM

## 2023-07-06 NOTE — Progress Notes (Signed)
Progress Note   Patient: Kenneth Benson [id number] DOB: May 14, 1961 DOA: 07/02/2023     4 DOS: the patient was seen and examined on 07/06/2023   Brief hospital course:  Kenneth Benson is a 61 y.o. male with medical history significant of CHF with EF of 45-50%, hypertension, COPD/asthma, atrial fibrillation on Eliquis, obesity, tobacco abuse, who presents with shortness of breath.Patient states that he has shortness of breath which has been progressively worsening over past 2 weeks. He has mild bilateral leg edema.  Patient is not using oxygen normally, found to have oxygen saturation 91% on room air, which improved to 99% on 3 L oxygen.  Patient states that he has not been taking his diltiazem for the past few weeks as he has run out.  HPI is limited as patient is tachycardic in A-fib RVR movement and speaking heart rate is elevated.  Patient does continue to smoke.  Denies any chest pain currently. In the emergency room: Patient is alert awake oriented mild distress due to tachycardia.  Vitals show bradycardia initially with respirations of 26 and normal blood pressure which has eventually changed to heart rate of 144 A-fib RVR respirations of 26 blood pressure of 103/88 O2 sats of 95% on 3 L. Venous blood gas-ordered and pending. BMP shows glucose 146 normal kidney function. Hepatic function test normal. BNP of 661/troponin of 14/repeat troponin of 13. Procalcitonin of less than 10. CBC within normal limits 15.8 normal white count normal platelet count. Respiratory panel is negative for COVID and influenza.    Assessment and Plan:  Acute hypoxic respiratory failure Secondary to acute COPD exacerbation Patient presented to the ER for evaluation of worsening shortness of breath from his baseline and had room air pulse oximetry of 91% requiring 2 L of oxygen to maintain pulse oximetry greater than 92% Patient will need to be assessed for home oxygen need prior to discharge 6-minute walk        Acute COPD exacerbation Continue as needed as well as scheduled bronchodilator therapy Continue systemic steroids, patient is currently on a prednisone taper Continue oxygen supplementation to maintain pulse oximetry greater than 92% Appreciate pulmonary input       Paroxysmal A-fib with rapid ventricular rate Patient was on Cardizem infusion which was discontinued due to hypotension He was then placed on amiodarone infusion but this has been discontinued since he is not a candidate for long-term therapy with amiodarone due to his underlying COPD. Cardiology recommends metoprolol and Cardizem Continue Eliquis as primary prophylaxis for an acute stroke     Chronic combined systolic and diastolic dysfunction CHF Stable and not acutely exacerbated Last 2D echocardiogram from 01/24 showed an LVEF of 45 to 50% Continue Jardiance, losartan, metoprolol and torsemide       Nicotine dependence Smoking cessation was discussed with patient's Continue nicotine transdermal patch       Obesity (BMI 36) Complicates overall prognosis and care Lifestyle modification and exercise has been discussed with patient in detail              Subjective: Patient is seen and examined at the bedside.  Physical Exam: Vitals:   07/06/23 0433 07/06/23 0617 07/06/23 0618 07/06/23 0834  BP: 102/71 107/69  101/83  Pulse: 77 63 (!) 115 97  Resp: 17   (!) 22  Temp:    98.2 F (36.8 C)  TempSrc:    Oral  SpO2: 93% 94%  93%  Weight:      Height:  Vitals and nursing note reviewed.  Constitutional:      Appearance: He is well-developed.  HENT:     Head: Normocephalic and atraumatic.  Eyes:     Pupils: Pupils are equal, round, and reactive to light.  Cardiovascular:     Rate and Rhythm: Tachycardia present. Rhythm irregular.  Pulmonary:     Effort: Tachypnea present.     Breath sounds: Diffuse wheezing but improved Abdominal:     General: Bowel sounds are normal.     Palpations:  Abdomen is soft.  Musculoskeletal:        General: Normal range of motion.     Cervical back: Normal range of motion and neck supple.  Skin:    General: Skin is warm and dry.  Neurological:     General: No focal deficit present.     Mental Status: He is alert.  Psychiatric:        Mood and Affect: Mood normal.        Behavior: Behavior normal.      Data Reviewed: Labs reviewed. There are no new results to review at this time.  Family Communication: Plan of care discussed with patient at the bedside.  Disposition: Status is: Inpatient Remains inpatient appropriate because: Treatment for COPD exacerbation and rate control for A-fib  Planned Discharge Destination: Home    Time spent: 33 minutes  Author: Lucile Shutters, MD 07/06/2023 12:17 PM  For on call review [email or web address].

## 2023-07-06 NOTE — Plan of Care (Signed)
  Problem: Activity: Goal: Capacity to carry out activities will improve Outcome: Progressing   Problem: Cardiac: Goal: Ability to achieve and maintain adequate cardiopulmonary perfusion will improve Outcome: Progressing   

## 2023-07-06 NOTE — Progress Notes (Addendum)
Physical Therapy Treatment Patient Details Name: Kenneth Benson MRN: [id number] DOB: Mar 08, 1961 Today's Date: 07/06/2023   History of Present Illness Pt is a 61y/o male presenting due to acute exacerbation of COPD and hypoxic respiratory failure. PMH inlcudes Afib, AKI, CHF, smoking, COPD, MI, chronic asthma, and obesity. Pt began amiodarone drip 8/5.    PT Comments  Pt presents laying in bed, some pain in b/l hips/abdominal region. Pt able to perform supine>sit with supervision for safety. Sit<> stand transfers and ambulation to bathroom/hallway performed with CGA and RW for safety.  Pt able to perform hygiene independently and tolerated today's treatment well.   PT closely monitored HR and SPO2 throughout session. HR response included 115-135 during bed mobiltiy, transfers, and ambulation to toilet. HR peaked 145 halfway through ambulation in hallway and returned to <135 with standing rest break. SPO2 remained >90% on 2L O2 throughout treatment with momentary drop to 88% immediately following ambulation that quickly recovered with verbal cues for deep breathing. MD notified of vitals response to activity. He would benefit from continued skilled therapy to maximize functional independence.    If plan is discharge home, recommend the following: A little help with walking and/or transfers;A little help with bathing/dressing/bathroom;Assistance with cooking/housework;Direct supervision/assist for financial management;Assist for transportation;Help with stairs or ramp for entrance;Direct supervision/assist for medications management   Can travel by private vehicle     No  Equipment Recommendations  Rolling walker (2 wheels)    Recommendations for Other Services       Precautions / Restrictions Precautions Precautions: Fall Restrictions Weight Bearing Restrictions: No     Mobility  Bed Mobility Overal bed mobility: Needs Assistance Bed Mobility: Supine to Sit     Supine to sit:  Supervision          Transfers Overall transfer level: Needs assistance Equipment used: Rolling walker (2 wheels) Transfers: Sit to/from Stand Sit to Stand: Contact guard assist           General transfer comment: CGA for safety    Ambulation/Gait Ambulation/Gait assistance: Contact guard assist Gait Distance (Feet): 180 Feet Assistive device: Rolling walker (2 wheels) Gait Pattern/deviations: Trunk flexed, Step-through pattern       General Gait Details: 1 rest break due to increased HR (~140-145bpm), recovered with standing rest break (~1-15min)   Stairs             Wheelchair Mobility     Tilt Bed    Modified Rankin (Stroke Patients Only)       Balance Overall balance assessment: Needs assistance Sitting-balance support: No upper extremity supported, Feet supported Sitting balance-Leahy Scale: Normal       Standing balance-Leahy Scale: Good                              Cognition Arousal: Alert Behavior During Therapy: WFL for tasks assessed/performed Overall Cognitive Status: Within Functional Limits for tasks assessed                                          Exercises General Exercises - Lower Extremity Short Arc Quad: AROM, Both, 10 reps, Supine    General Comments General comments (skin integrity, edema, etc.): HR remained 115-135 during bed mobiltiy, transfers, and ambulation to toilet. HR peaked 145 halfway through ambulation in hallway and returned <135 with standing rest break  Pertinent Vitals/Pain Pain Assessment Pain Assessment: Faces Faces Pain Scale: Hurts a little bit Pain Location: B/L knees/ abdomen Pain Descriptors / Indicators: Discomfort Pain Intervention(s): Monitored during session    Home Living                          Prior Function            PT Goals (current goals can now be found in the care plan section) Acute Rehab PT Goals Patient Stated Goal: return  home PT Goal Formulation: With patient Time For Goal Achievement: 07/19/23 Potential to Achieve Goals: Fair Progress towards PT goals: Progressing toward goals    Frequency    Min 1X/week      PT Plan      Co-evaluation              AM-PAC PT "6 Clicks" Mobility   Outcome Measure  Help needed turning from your back to your side while in a flat bed without using bedrails?: A Little Help needed moving from lying on your back to sitting on the side of a flat bed without using bedrails?: A Little Help needed moving to and from a bed to a chair (including a wheelchair)?: A Little Help needed standing up from a chair using your arms (e.g., wheelchair or bedside chair)?: A Little Help needed to walk in hospital room?: A Little Help needed climbing 3-5 steps with a railing? : A Lot 6 Click Score: 17    End of Session Equipment Utilized During Treatment: Gait belt Activity Tolerance: Patient tolerated treatment well;Other (comment) (limited by HR response) Patient left: in chair;with call bell/phone within reach;with chair alarm set (MD in room)   PT Visit Diagnosis: Other abnormalities of gait and mobility (R26.89);Difficulty in walking, not elsewhere classified (R26.2)     Time: 1610-9604 PT Time Calculation (min) (ACUTE ONLY): 31 min  Charges:    $Therapeutic Activity: 23-37 mins PT General Charges $$ ACUTE PT VISIT: 1 Visit                     , PT, SPT 12:26 PM,07/06/23

## 2023-07-06 NOTE — Consult Note (Signed)
Subjective:   CC: Inguinal hernia  HPI:  Kenneth Benson is a 62 y.o. male who was referred by [facility] for evaluation of above cc.   Symptoms were first noted several years ago.  Increasing in size.  Increasing discomfort as well, especially walking.  Occasional issues with constipation.   Past Medical History:  has a past medical history of A-fib (HCC) (02/06/2022), AKI (acute kidney injury) (HCC) (12/17/2022), Arrhythmia, Asthma, CHF (congestive heart failure) (HCC), COPD (chronic obstructive pulmonary disease) (HCC), Hyperkalemia (12/17/2022), Hypokalemia (04/13/2023), Myocardial injury (04/13/2023), and Tobacco abuse.  Past Surgical History: History reviewed. No pertinent surgical history.  Family History: family history includes Atrial fibrillation in his father; Heart disease in his mother.  Social History:  reports that he has been smoking cigarettes. He has a 40.5 pack-year smoking history. He does not have any smokeless tobacco history on file. He reports that he does not drink alcohol and does not use drugs.  Current Medications:  Prior to Admission medications   Medication Sig Start Date End Date Taking? Authorizing Provider  albuterol (VENTOLIN HFA) 108 (90 Base) MCG/ACT inhaler Inhale 2 puffs into the lungs every 6 (six) hours as needed for wheezing or shortness of breath. 04/17/23  Yes Sreenath, Sudheer B, MD  apixaban (ELIQUIS) 5 MG TABS tablet Take 1 tablet (5 mg total) by mouth 2 (two) times daily. 04/17/23 07/02/23 Yes Sreenath, Sudheer B, MD  JARDIANCE 10 MG TABS tablet Take 10 mg by mouth daily. 06/22/23  Yes [provider]  losartan (COZAAR) 25 MG tablet Take 0.5 tablets (12.5 mg total) by mouth daily. 04/17/23 07/02/23 Yes Sreenath, Sudheer B, MD  metoprolol tartrate (LOPRESSOR) 50 MG tablet Take 1 tablet (50 mg total) by mouth 2 (two) times daily. 04/17/23 07/02/23 Yes Sreenath, Sudheer B, MD  torsemide (DEMADEX) 20 MG tablet Take 2 tablets (40 mg total) by mouth daily.  04/17/23 07/02/23 Yes Sreenath, Sudheer B, MD  albuterol (PROVENTIL) (2.5 MG/3ML) 0.083% nebulizer solution Take 3 mLs (2.5 mg total) by nebulization every 6 (six) hours as needed for wheezing or shortness of breath. Patient not taking: Reported on 07/02/2023 04/17/23 07/02/23  Tresa Moore, MD  arformoterol (BROVANA) 15 MCG/2ML NEBU Take 2 mLs (15 mcg total) by nebulization 2 (two) times daily. Patient not taking: Reported on 05/11/2023 04/17/23 06/16/23  Lolita Patella B, MD  budesonide (PULMICORT) 0.5 MG/2ML nebulizer solution Take 2 mLs (0.5 mg total) by nebulization 2 (two) times daily. Patient not taking: Reported on 05/11/2023 04/17/23 06/16/23  Tresa Moore, MD  diltiazem (CARDIZEM CD) 360 MG 24 hr capsule Take 1 capsule (360 mg total) by mouth daily. Patient not taking: Reported on 07/02/2023 04/17/23 06/16/23  Tresa Moore, MD  amiodarone (PACERONE) 200 MG tablet Take 1 tablet (200 mg total) by mouth 2 (two) times daily. 02/11/22 03/15/22  Lewie Chamber, MD    Allergies:  Allergies as of 07/02/2023   (No Known Allergies)    ROS:  General: Denies weight loss, weight gain, fatigue, fevers, chills, and night sweats. Eyes: Denies blurry vision, double vision, eye pain, itchy eyes, and tearing. Ears: Denies hearing loss, earache, and ringing in ears. Nose: Denies sinus pain, congestion, infections, runny nose, and nosebleeds. Mouth/throat: Denies hoarseness, sore throat, bleeding gums, and difficulty swallowing. Heart: Denies chest pain, palpitations, racing heart, irregular heartbeat, leg pain or swelling, and decreased activity tolerance. Respiratory: Denies breathing difficulty, shortness of breath, wheezing, cough, and sputum. GI: Denies change in appetite, heartburn, nausea, vomiting, constipation, diarrhea,  and blood in stool. GU: Denies difficulty urinating, pain with urinating, urgency, frequency, blood in urine. Musculoskeletal: Denies joint stiffness, pain, swelling,  muscle weakness. Skin: Denies rash, itching, mass, tumors, sores, and boils Neurologic: Denies headache, fainting, dizziness, seizures, numbness, and tingling. Psychiatric: Denies depression, anxiety, difficulty sleeping, and memory loss. Endocrine: Denies heat or cold intolerance, and increased thirst or urination. Blood/lymph: Denies easy bruising, easy bruising, and swollen glands     Objective:     BP 93/78 (BP Location: Left Arm)   Pulse 82   Temp 98.2 F (36.8 C) (Oral)   Resp (!) 22   Ht 6' (1.829 m)   Wt 128.4 kg   SpO2 91%   BMI 38.40 kg/m   Constitutional :  alert, cooperative, appears stated age, and no distress  Lymphatics/Throat:  no asymmetry, masses, or scars  Respiratory:  clear to auscultation bilaterally  Cardiovascular:  regular rate and rhythm  Gastrointestinal: soft, non-tender; bowel sounds normal; no masses,  no organomegaly.  Bilateral inguinal hernia noted.  Extremely large and extending into scrotum bilaterally.  Soft with minimal tenderness to palpation but incarcerated.  No overlying skin changes.  Musculoskeletal: lying in bed, no obvious difficulty moving upper extremities  Skin: Cool and moist  Psychiatric: Normal affect, non-agitated, not confused       LABS:     Latest Ref Rng & Units 07/06/2023    5:35 AM 07/05/2023    5:55 AM 07/04/2023    6:23 AM  CMP  Glucose 70 - 99 mg/dL 81  92  784   BUN 6 - 20 mg/dL 32  33  31   Creatinine 0.61 - 1.24 mg/dL 6.96  2.95  2.84   Sodium 135 - 145 mmol/L 139  140  137   Potassium 3.5 - 5.1 mmol/L 3.5  3.7  4.4   Chloride 98 - 111 mmol/L 94  98  98   CO2 22 - 32 mmol/L 36  35  30   Calcium 8.9 - 10.3 mg/dL 8.3  8.4  8.7       Latest Ref Rng & Units 07/06/2023    5:35 AM 07/05/2023    5:55 AM 07/04/2023    6:23 AM  CBC  WBC 4.0 - 10.5 K/uL 8.2  10.0  13.3   Hemoglobin 13.0 - 17.0 g/dL 13.2  44.0  10.2   Hematocrit 39.0 - 52.0 % 43.7  46.3  46.0   Platelets 150 - 400 K/uL 201  190  227      RADS: CLINICAL DATA:  Mesenteric ischemia.   EXAM: CTA ABDOMEN AND PELVIS WITHOUT AND WITH CONTRAST   TECHNIQUE: Multidetector CT imaging of the abdomen and pelvis was performed using the standard protocol during bolus administration of intravenous contrast. Multiplanar reconstructed images and MIPs were obtained and reviewed to evaluate the vascular anatomy.   RADIATION DOSE REDUCTION: This exam was performed according to the departmental dose-optimization program which includes automated exposure control, adjustment of the mA and/or kV according to patient size and/or use of iterative reconstruction technique.   CONTRAST:  OMNIPAQUE IOHEXOL 350 MG/ML SOLN   COMPARISON:  Abdominal x-ray 02/18/2022   FINDINGS: VASCULAR   Aorta: Overall normal course and caliber of the abdominal aorta with minimal atherosclerotic disease. No dissection or aneurysm formation.   Celiac: Standard branching pattern to the celiac axis. No ostial stenosis. Tortuous course of the splenic artery.   SMA: Patent without evidence of aneurysm, dissection, vasculitis or significant stenosis.  Renals: 2 left and single right main renal arteries. No significant stenosis.   IMA: Patent.   Inflow: No significant stenosis.  Minimal atherosclerotic disease.   Proximal Outflow: Grossly preserved.  No significant stenosis.   Veins: Somewhat distended IVC and central iliac vessels. Timing of the contrast is arterial.   Review of the MIP images confirms the above findings.   NON-VASCULAR   Lower chest: Heart is enlarged. Small pericardial effusion. Tiny pleural effusions. There patchy lung base opacities with breathing motion. Possible enlarged node seen in the lower posterior mediastinum measured on series 6, image 1 measuring 2.2 x 1.6 cm. On prior chest CT of 01/30/2022 this node would have measured 17 x 12 mm.   Hepatobiliary: Fatty liver infiltration. No obvious  space-occupying liver lesion with the limits of the early phase of the arterial bolus. The gallbladder is contracted with stones. Patent portal vein.   Pancreas: Mild global pancreatic atrophy without obvious mass.   Spleen: Spleen is nonenlarged.   Adrenals/Urinary Tract: Adrenal glands are preserved. No enhancing renal mass. Right kidney is slightly malrotated. There is a Bosniak 1 cyst extending inferior from the mid right kidney measuring diameter approaching 4.4 cm with Hounsfield unit of 8. No aggressive features. Bosniak 1 lesion. No specific imaging follow-up recommended. No collecting system dilatation. The ureters have normal course and caliber down to the bladder. Preserved contours of the urinary bladder.   Stomach/Bowel: On this non oral contrast exam large bowel has a normal course and caliber with scattered stool. The cecum resides in the central pelvis. Normal retrocecal appendix. The stomach has some luminal debris but is nondilated. Small bowel is nondilated. No free air.   Lymphatic: Few small less than 1 cm in size in short axis nodes are seen along the retroperitoneum and mesentery, not pathologic by size criteria.   Reproductive: Prominent prostate.   Other: Large bilateral inguinal hernias. The right contains fat and fluid with stranding. The left similar but also contains the sigmoid colon. Due to the size of the hernia the caudal aspect of the bowel and hernias not included in the imaging field. What are seen of the colon is nondilated.   Musculoskeletal: Patient is tilted in the scanner. Scattered degenerative changes of the spine. There is some mild compression of the superior endplate of L3 but the margins are sclerotic and favor a chronic process.   IMPRESSION: VASCULAR   Minimal atherosclerotic disease. No significant stenosis or vessel occlusion of the central vessels of the abdomen or the aorta or iliac vessels.   NON-VASCULAR   Large  bilateral inguinal hernias. The right containing fat, fluid and stranding. The left containing the sigmoid colon. This is a wide mouth hernia with no signs of obstruction   Note the entirety of the sigmoid colon is not completely included in the imaging field due to the size of the hernia and extension caudal.   Mild ascites with anasarca. Trace pleural fluid.   The heart is enlarged.   Slightly enlarged lower posterior right-sided mediastinal lymph node at the edge of the imaging field compared to an older study. Recommend dedicated chest workup when appropriate.   Lung base parenchymal opacities.     Electronically Signed   By: Karen Kays M.D.   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 Plan:   Chronic issue but increasing in size.  Exam today reassuring for no signs of strangulation or bowel obstruction.  Due to the size and patient comorbidities, recommended second opinion at a tertiary care center for more definitive treatment.    Primary team notified of plan and they are in agreement.  Please call surgery with additional questions or concerns.  Labs/images/medications/previous chart entries reviewed personally and relevant changes/updates noted above.

## 2023-07-06 NOTE — Progress Notes (Signed)
PULMONOLOGY         Date: 07/06/2023,   MRN# [id number] Kenneth Benson [birth year]     AdmissionWeight: 122.5 kg                 CurrentWeight: 128.4 kg  Referring provider: Dr Joylene Igo   CHIEF COMPLAINT:   Severe acute exacerbation of COPD   HISTORY OF PRESENT ILLNESS   62 year old male with history of A-fib, AKI, dysrhythmia, chronic asthma, CHF, lifelong smoking history with COPD and chronic hypoxemia, previous MRI who came in with chief complaint of dyspnea and progressively worsening shortness of breath.  Patient admits to noncompliance with supplemental oxygen at home.  Was found to be in A-fib RVR on presentation.  Required 3 L of oxygen to reach normoxia.  Initial blood work showed elevation of BNP suggestive of cardiac stress.  He was tested for COVID-19 and influenza on admission which were both negative.  His most recent echo shows reduced ejection fraction of 45% PCCM consultation for further evaluation management.   07/05/23-patient is now on 2L/min [place] reports feeling better.  Had bath today shares he's refreshed. Overall partial improvement in past 24hr. He is 2.6L net negative. LE edema is now trace. He is being seen by cardiology. Steroids reduced from solumedrol to Prednisone with daily tapering by 5mg .    07/06/23- patient is up in chair s/p PT today.  He is on 2L/min reports feeling better.  He still has scrotal edema but LE edema has resolved. He is on tapering dose of prednisone.   PAST MEDICAL HISTORY   Past Medical History:  Diagnosis Date   A-fib (HCC) 02/06/2022   AKI (acute kidney injury) (HCC) 12/17/2022   Arrhythmia    atrial fibrillation   Asthma    CHF (congestive heart failure) (HCC)    COPD (chronic obstructive pulmonary disease) (HCC)    Hyperkalemia 12/17/2022   Hypokalemia 04/13/2023   Myocardial injury 04/13/2023   Tobacco abuse      SURGICAL HISTORY   History reviewed. No pertinent surgical history.   FAMILY HISTORY   Family  History  Problem Relation Age of Onset   Heart disease Mother    Atrial fibrillation Father      SOCIAL HISTORY   Social History   Tobacco Use   Smoking status: Every Day    Current packs/day: 1.50    Average packs/day: 1.5 packs/day for 27.0 years (40.5 ttl pk-yrs)    Types: Cigarettes   Tobacco comments:    0.5PPD at the most.  trying to quit.  05/11/2023 hfb  Vaping Use   Vaping status: Never Used  Substance Use Topics   Alcohol use: No   Drug use: No     MEDICATIONS    Home Medication:    Current Medication:  Current Facility-Administered Medications:    acetaminophen (TYLENOL) tablet 650 mg, 650 mg, Oral, Q6H PRN, 650 mg at 07/04/23 0833 **OR** acetaminophen (TYLENOL) suppository 650 mg, 650 mg, Rectal, Q6H PRN, [name], Ekta V, MD   albuterol (PROVENTIL) (2.5 MG/3ML) 0.083% nebulizer solution 2.5 mg, 2.5 mg, Nebulization, Q2H PRN, Gertha Calkin, MD   apixaban (ELIQUIS) tablet 5 mg, 5 mg, Oral, BID, Irena Cords V, MD, 5 mg at 07/06/23 0844   bisacodyl (DULCOLAX) EC tablet 5 mg, 5 mg, Oral, Daily PRN, Gertha Calkin, MD   diltiazem (CARDIZEM) tablet 30 mg, 30 mg, Oral, Q6H, Tang, Cheryln Manly, PA-C, 30 mg at 07/06/23 4098   docusate sodium (  COLACE) capsule 100 mg, 100 mg, Oral, BID, Irena Cords V, MD, 100 mg at 07/06/23 0844   empagliflozin (JARDIANCE) tablet 10 mg, 10 mg, Oral, Daily, Agbata, Tochukwu, MD, 10 mg at 07/06/23 0844   guaiFENesin (MUCINEX) 12 hr tablet 600 mg, 600 mg, Oral, BID PRN, Gertha Calkin, MD   hydrALAZINE (APRESOLINE) injection 5 mg, 5 mg, Intravenous, Q4H PRN, Gertha Calkin, MD   HYDROcodone-acetaminophen (NORCO/VICODIN) 5-325 MG per tablet 1-2 tablet, 1-2 tablet, Oral, Q4H PRN, Gertha Calkin, MD   insulin aspart (novoLOG) injection 0-15 Units, 0-15 Units, Subcutaneous, TID WC, Agbata, Tochukwu, MD, 2 Units at 07/06/23 0843   ipratropium (ATROVENT) nebulizer solution 0.5 mg, 0.5 mg, Nebulization, Q6H, Agbata, Tochukwu, MD, 0.5 mg at 07/06/23  0738   levalbuterol (XOPENEX) nebulizer solution 0.63 mg, 0.63 mg, Nebulization, Q6H, Agbata, Tochukwu, MD, 0.63 mg at 07/06/23 0738   metoprolol succinate (TOPROL-XL) 24 hr tablet 50 mg, 50 mg, Oral, BID, Tang, Lily Michelle, PA-C   metoprolol tartrate (LOPRESSOR) tablet 25 mg, 25 mg, Oral, Q6H, Tang, Lily Michelle, PA-C, 25 mg at 07/06/23 4098   morphine (PF) 2 MG/ML injection 2 mg, 2 mg, Intravenous, Q2H PRN, Gertha Calkin, MD   nicotine (NICODERM CQ - dosed in mg/24 hours) patch 14 mg, 14 mg, Transdermal, Daily, Irena Cords V, MD, 14 mg at 07/02/23 2001   ondansetron (ZOFRAN) tablet 4 mg, 4 mg, Oral, Q6H PRN **OR** ondansetron (ZOFRAN) injection 4 mg, 4 mg, Intravenous, Q6H PRN, Gertha Calkin, MD   Oral care mouth rinse, 15 mL, Mouth Rinse, PRN, Agbata, Tochukwu, MD   polyethylene glycol (MIRALAX / GLYCOLAX) packet 17 g, 17 g, Oral, Daily PRN, [name], Eliezer Mccoy, MD   potassium chloride SA (KLOR-CON M) CR tablet 40 mEq, 40 mEq, Oral, BID, Tang, Lily Michelle, PA-C, 40 mEq at 07/06/23 0844   [COMPLETED] predniSONE (DELTASONE) tablet 50 mg, 50 mg, Oral, Q breakfast, 50 mg at 07/06/23 0844 **FOLLOWED BY** [START ON 07/07/2023] predniSONE (DELTASONE) tablet 45 mg, 45 mg, Oral, Q breakfast **FOLLOWED BY** [START ON 07/08/2023] predniSONE (DELTASONE) tablet 40 mg, 40 mg, Oral, Q breakfast **FOLLOWED BY** [START ON 07/09/2023] predniSONE (DELTASONE) tablet 35 mg, 35 mg, Oral, Q breakfast **FOLLOWED BY** [START ON 07/10/2023] predniSONE (DELTASONE) tablet 30 mg, 30 mg, Oral, Q breakfast **FOLLOWED BY** [START ON 07/11/2023] predniSONE (DELTASONE) tablet 25 mg, 25 mg, Oral, Q breakfast **FOLLOWED BY** [START ON 07/12/2023] predniSONE (DELTASONE) tablet 20 mg, 20 mg, Oral, Q breakfast **FOLLOWED BY** [START ON 07/13/2023] predniSONE (DELTASONE) tablet 15 mg, 15 mg, Oral, Q breakfast **FOLLOWED BY** [START ON 07/14/2023] predniSONE (DELTASONE) tablet 10 mg, 10 mg, Oral, Q breakfast **FOLLOWED BY** [START ON 07/15/2023]  predniSONE (DELTASONE) tablet 5 mg, 5 mg, Oral, Q breakfast, , , MD   sodium chloride flush (NS) 0.9 % injection 3 mL, 3 mL, Intravenous, Q12H, Irena Cords V, MD, 3 mL at 07/06/23 0846   spironolactone (ALDACTONE) tablet 12.5 mg, 12.5 mg, Oral, Daily, Tang, Lily Michelle, PA-C, 12.5 mg at 07/06/23 0844   torsemide (DEMADEX) tablet 40 mg, 40 mg, Oral, Daily, Agbata, Tochukwu, MD, 40 mg at 07/06/23 0844    ALLERGIES   Patient has no known allergies.     REVIEW OF SYSTEMS    Review of Systems:  Gen:  Denies  fever, sweats, chills weigh loss  HEENT: Denies blurred vision, double vision, ear pain, eye pain, hearing loss, nose bleeds, sore throat Cardiac:  No dizziness, chest pain or heaviness, chest tightness,edema Resp:  reports dyspnea chronically  Gi: Denies swallowing difficulty, stomach pain, nausea or vomiting, diarrhea, constipation, bowel incontinence Gu:  Denies bladder incontinence, burning urine Ext:   Denies Joint pain, stiffness or swelling Skin: Denies  skin rash, easy bruising or bleeding or hives Endoc:  Denies polyuria, polydipsia , polyphagia or weight change Psych:   Denies depression, insomnia or hallucinations   Other:  All other systems negative   VS: BP 101/83 (BP Location: Right Arm)   Pulse 97   Temp 98.2 F (36.8 C) (Oral)   Resp (!) 22   Ht 6' (1.829 m)   Wt 128.4 kg   SpO2 93%   BMI 38.40 kg/m      PHYSICAL EXAM    GENERAL:NAD, no fevers, chills, no weakness no fatigue HEAD: Normocephalic, atraumatic.  EYES: Pupils equal, round, reactive to light. Extraocular muscles intact. No scleral icterus.  MOUTH: Moist mucosal membrane. Dentition intact. No abscess noted.  EAR, NOSE, THROAT: Clear without exudates. No external lesions.  NECK: Supple. No thyromegaly. No nodules. No JVD.  PULMONARY: decreased breath sounds with mild rhonchi worse at bases bilaterally.  CARDIOVASCULAR: S1 and S2. Regular rate and rhythm. No murmurs, rubs,  or gallops. No edema. Pedal pulses 2+ bilaterally.  GASTROINTESTINAL: Soft, nontender, nondistended. No masses. Positive bowel sounds. No hepatosplenomegaly.  MUSCULOSKELETAL: No swelling, clubbing, or edema. Range of motion full in all extremities.  NEUROLOGIC: Cranial nerves II through XII are intact. No gross focal neurological deficits. Sensation intact. Reflexes intact.  SKIN: No ulceration, lesions, rashes, or cyanosis. Skin warm and dry. Turgor intact.  PSYCHIATRIC: Mood, affect within normal limits. The patient is awake, alert and oriented x 3. Insight, judgment intact.       IMAGING      ASSESSMENT/PLAN   Acute on chronic hypoxemic respiratory failure -there is interstitial edema, atelectasis bilaterally and cardiomegaly.  -he reports being off his medications due to cost prohibitive pricing.  -patient states he requires financial assistance.  -he states he stopped taking his diuretics and cardiac medications -he appears improved from cardiac dysfunction perspective  -would diurese first and recruit lungs with IS and metaneb -he needs PT/OT    Tobacco abuse   - patient continues to smoke actively   Acute on chronic systolic CHF with EF <45% - S/p lasix 60mg  and now on torsemide 40mg  -patient shares he is able to urinate adequately without issues -he has AF and is on metoprolol and this makes diuresis more complicated.    Scrotal swelling   US scrotum - concern for inguinal hernia    - general surgery consultation    Thank you for allowing me to participate in the care of this patient.   Patient/Family are satisfied with care plan and all questions have been answered.    Provider disclosure: Patient with at least one acute or chronic illness or injury that poses a threat to life or bodily function and is being managed actively during this encounter.  All of the below services have been performed independently by signing provider:  review of prior documentation  from internal and or external health records.  Review of previous and current lab results.  Interview and comprehensive assessment during patient visit today. Review of current and previous chest radiographs/CT scans. Discussion of management and test interpretation with health care team and patient/family.   This document was prepared using Dragon voice recognition software and may include unintentional dictation errors.     [name], M.D.  Division of Pulmonary &  Critical Care Medicine

## 2023-07-06 NOTE — Progress Notes (Signed)
[facility] CLINIC CARDIOLOGY CONSULT NOTE       Patient ID: Kenneth Benson MRN: [id number] DOB/AGE: [birth year] 62 y.o.  Admit date: 07/02/2023 Referring Physician Dr. Joylene Igo Primary Physician none Primary Cardiologist previous Dr. 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  Cardiology is consulted for further assistance.  Interval History:  -continues to feel a little better today, ongoing headache, lightheadedness and pleuritic discomfort with coughing -no anginal pain, still with productive cough  - in AF with a little better rate today in the 100s-110s following addition of diltiazem   Review of systems complete and found to be negative unless listed above     Past Medical History:  Diagnosis Date   A-fib (HCC) 02/06/2022   AKI (acute kidney injury) (HCC) 12/17/2022   Arrhythmia    atrial fibrillation   Asthma    CHF (congestive heart failure) (HCC)    COPD (chronic obstructive pulmonary disease) (HCC)    Hyperkalemia 12/17/2022   Hypokalemia 04/13/2023   Myocardial injury 04/13/2023   Tobacco abuse     History reviewed. No pertinent surgical history.  Medications Prior to Admission  Medication Sig Dispense Refill Last Dose   albuterol (VENTOLIN HFA) 108 (90 Base) MCG/ACT inhaler Inhale 2 puffs into the lungs every 6 (six) hours as needed for wheezing or shortness of breath. 6.7 g 2 prn   apixaban (ELIQUIS) 5 MG TABS tablet Take 1 tablet (5 mg total) by mouth 2 (two) times daily. 60 tablet 1 07/02/2023 at 0730   JARDIANCE 10 MG TABS tablet Take 10 mg by mouth daily.   07/02/2023 at 0730   losartan (COZAAR) 25 MG tablet Take 0.5 tablets (12.5 mg total) by mouth daily. 15 tablet 1 07/02/2023 at 0730   metoprolol tartrate (LOPRESSOR) 50 MG tablet Take 1 tablet (50 mg total)  by mouth 2 (two) times daily. 60 tablet 1 07/02/2023 at 0730   torsemide (DEMADEX) 20 MG tablet Take 2 tablets (40 mg total) by mouth daily. 60 tablet 1 07/02/2023 at 0730   albuterol (PROVENTIL) (2.5 MG/3ML) 0.083% nebulizer solution Take 3 mLs (2.5 mg total) by nebulization every 6 (six) hours as needed for wheezing or shortness of breath. (Patient not taking: Reported on 07/02/2023) 720 mL 0 Not Taking   arformoterol (BROVANA) 15 MCG/2ML NEBU Take 2 mLs (15 mcg total) by nebulization 2 (two) times daily. (Patient not taking: Reported on 05/11/2023) 120 mL 1    budesonide (PULMICORT) 0.5 MG/2ML nebulizer solution Take 2 mLs (0.5 mg total) by nebulization 2 (two) times daily. (Patient not taking: Reported on 05/11/2023) 120 mL 1    diltiazem (CARDIZEM CD) 360 MG 24 hr capsule Take 1 capsule (360 mg total) by mouth daily. (Patient not taking: Reported on 07/02/2023) 30 capsule 1 Not Taking   Social History   Socioeconomic History   Marital status: Single    Spouse name: Not on file   Number of children: Not on file   Years of education: Not on file   Highest education level: Not on file  Occupational History   Not on file  Tobacco Use   Smoking status: Every Day    Current packs/day: 1.50    Average packs/day: 1.5 packs/day for 27.0 years (40.5 ttl pk-yrs)    Types: Cigarettes  Smokeless tobacco: Not on file   Tobacco comments:    0.5PPD at the most.  trying to quit.  05/11/2023 hfb  Vaping Use   Vaping status: Never Used  Substance and Sexual Activity   Alcohol use: No   Drug use: No   Sexual activity: Not on file  Other Topics Concern   Not on file  Social History Narrative   Not on file   Social Determinants of Health   Financial Resource Strain: Not on file  Food Insecurity: No Food Insecurity (07/03/2023)   Hunger Vital Sign    Worried About Running Out of Food in the Last Year: Never true    Ran Out of Food in the Last Year: Never true  Transportation Needs: No Transportation  Needs (07/03/2023)   PRAPARE - Administrator, Civil Service (Medical): No    Lack of Transportation (Non-Medical): No  Physical Activity: Not on file  Stress: Not on file  Social Connections: Not on file  Intimate Partner Violence: Not At Risk (07/03/2023)   Humiliation, Afraid, Rape, and Kick questionnaire    Fear of Current or Ex-Partner: No    Emotionally Abused: No    Physically Abused: No    Sexually Abused: No    Family History  Problem Relation Age of Onset   Heart disease Mother    Atrial fibrillation Father       Intake/Output Summary (Last 24 hours) at 07/06/2023 1013 Last data filed at 07/06/2023 1610 Gross per 24 hour  Intake 1324.91 ml  Output 4350 ml  Net -3025.09 ml    Vitals:   07/06/23 0433 07/06/23 0617 07/06/23 0618 07/06/23 0834  BP: 102/71 107/69  101/83  Pulse: 77 63 (!) 115 97  Resp: 17   (!) 22  Temp:    98.2 F (36.8 C)  TempSrc:    Oral  SpO2: 93% 94%  93%  Weight:      Height:        PHYSICAL EXAM General: middle aged male , well nourished, in no acute distress. Laying flat in bed HEENT:  Normocephalic and atraumatic. Neck:  No JVD.  Lungs: slight conversational dyspnea on 3L by [place]. Cough with deep inspiration. Poor air movement, trace wheezing with prolonged expiratory phase, no appreciable crackles  Heart: irregular irregular with controlled rate. Normal S1 and S2 without gallops or murmurs.  Abdomen: Non-distended appearing.  Msk: Normal strength and tone for age. Extremities: Warm and well perfused. No clubbing, cyanosis. No LE edema.  Neuro: Alert and oriented X 3. Psych:  Answers questions appropriately.   Labs: Basic Metabolic Panel: Recent Labs    07/05/23 0555 07/06/23 0535  NA 140 139  K 3.7 3.5  CL 98 94*  CO2 35* 36*  GLUCOSE 92 81  BUN 33* 32*  CREATININE 1.00 1.07  CALCIUM 8.4* 8.3*  MG 2.4  --    Liver Function Tests: No results for input(s): "AST", "ALT", "ALKPHOS", "BILITOT", "PROT", "ALBUMIN" in  the last 72 hours.  No results for input(s): "LIPASE", "AMYLASE" in the last 72 hours. CBC: Recent Labs    07/05/23 0555 07/06/23 0535  WBC 10.0 8.2  HGB 14.3 13.9  HCT 46.3 43.7  MCV 90.1 88.5  PLT 190 201   Cardiac Enzymes: Recent Labs    07/04/23 1422 07/04/23 1620  TROPONINIHS 12 11   BNP: No results for input(s): "BNP" in the last 72 hours.  D-Dimer: No results for input(s): "DDIMER" in the last  72 hours. Hemoglobin A1C: No results for input(s): "HGBA1C" in the last 72 hours. Fasting Lipid Panel: No results for input(s): "CHOL", "HDL", "LDLCALC", "TRIG", "CHOLHDL", "LDLDIRECT" in the last 72 hours. Thyroid Function Tests: No results for input(s): "TSH", "T4TOTAL", "T3FREE", "THYROIDAB" in the last 72 hours.  Invalid input(s): "FREET3" Anemia Panel: No results for input(s): "VITAMINB12", "FOLATE", "FERRITIN", "TIBC", "IRON", "RETICCTPCT" in the last 72 hours.   Radiology: [facility] Chest Port 1 View  Result Date: 07/04/2023 CLINICAL DATA:  Shortness of breath.  Rapid response called. EXAM: PORTABLE CHEST 1 VIEW COMPARISON:  07/02/2023 FINDINGS: Single AP view of the chest demonstrates enlargement of the cardiac silhouette. Central vascular structures are mildly prominent but no overt pulmonary edema. Negative for a pneumothorax. No focal airspace disease or lung consolidation. The trachea is midline. IMPRESSION: Enlargement of the cardiac silhouette without overt pulmonary edema. Electronically Signed   By: Richarda Overlie M.D.   On: 07/04/2023 15:12   DG HIP PORT UNILAT WITH PELVIS 1V LEFT  Result Date: 07/04/2023 CLINICAL DATA:  Chronic left hip pain. EXAM: DG HIP (WITH OR WITHOUT PELVIS) 1V PORT LEFT COMPARISON:  None Available. FINDINGS: Mild-to-moderate left and mild right femoroacetabular joint space narrowing. Mild to moderate bilateral femoral head-neck junction degenerative osteophytosis. Mild bilateral superolateral acetabular degenerative osteophytosis. Mild bilateral  sacroiliac subchondral sclerosis. The pubic symphysis joint space is maintained. No acute fracture or dislocation. 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 Electronically Signed   By: Neita Garnet M.D.   On: 07/04/2023 11:58   CT Angio Chest Pulmonary Embolism (PE) W or WO Contrast  Result Date: 07/02/2023 CLINICAL DATA:  Audible wheezing. EXAM: CT ANGIOGRAPHY CHEST WITH CONTRAST TECHNIQUE: Multidetector CT imaging of the chest was performed using the standard protocol during bolus administration of intravenous contrast. Multiplanar CT image reconstructions and MIPs were obtained to evaluate the vascular anatomy. RADIATION DOSE REDUCTION: This exam was performed according to the departmental dose-optimization program which includes automated exposure control, adjustment of the mA and/or kV according to patient size and/or use of iterative reconstruction technique. CONTRAST:  OMNIPAQUE IOHEXOL 350 MG/ML SOLN COMPARISON:  Apr 13, 2023 FINDINGS: Cardiovascular: Thoracic aorta is normal in appearance. Satisfactory opacification of the pulmonary arteries to the segmental level. No evidence of pulmonary embolism. There is mild cardiomegaly with mild coronary artery calcification. No pericardial effusion. Mediastinum/Nodes: No enlarged mediastinal, hilar, or axillary lymph nodes. Thyroid gland, trachea, and esophagus demonstrate no significant findings. Lungs/Pleura: Mild lingular, right middle lobe and bibasilar atelectasis is seen. A stable 4 mm left upper lobe lung nodule is noted. There is no evidence of acute infiltrate, pleural effusion or pneumothorax. Upper Abdomen: A punctate gallstone is seen within an otherwise normal-appearing gallbladder. Musculoskeletal: No chest wall abnormality. No acute or significant osseous findings. Review of the MIP images confirms the above findings. IMPRESSION: 1. No evidence of pulmonary embolism. 2. Mild lingular, right middle  lobe and bibasilar atelectasis. 3. Stable 4 mm left upper lobe pulmonary nodule. No follow-up needed if patient is low-risk.This recommendation follows the consensus statement: Guidelines for Management of Incidental Pulmonary Nodules Detected on CT Images: From the Fleischner Society 2017; Radiology 2017; 284:228-243. 4. Cholelithiasis. Electronically Signed   By: Aram Candela M.D.   On: 07/02/2023 21:16   DG Chest 2 View  Result Date: 07/02/2023 CLINICAL DATA:  Shortness of breath. EXAM: CHEST - 2 VIEW COMPARISON:  Chest x-ray dated Apr 16, 2023. FINDINGS: Unchanged cardiomegaly. Normal pulmonary vascularity. Mild streaky opacities at both lung  bases consistent with atelectasis. No focal consolidation, pleural effusion, or pneumothorax. No acute osseous abnormality. IMPRESSION: Mild bibasilar atelectasis. Electronically Signed   By: Obie Dredge M.D.   On: 07/02/2023 15:00    ECHO 12/18/2022  1. Left ventricular ejection fraction, by estimation, is 45 to 50%. The  left ventricle has mildly decreased function. The left ventricle has no  regional wall motion abnormalities. The left ventricular internal cavity  size was mildly dilated. Left  ventricular diastolic parameters were normal.   2. Right ventricular systolic function is normal. The right ventricular  size is normal. There is mildly elevated pulmonary artery systolic  pressure.   3. The mitral valve is normal in structure. Moderate mitral valve  regurgitation. No evidence of mitral stenosis.   4. Tricuspid valve regurgitation is moderate.   5. The aortic valve is normal in structure. Aortic valve regurgitation is  not visualized. No aortic stenosis is present.   6. The inferior vena cava is normal in size with greater than 50%  respiratory variability, suggesting right atrial pressure of 3 mmHg.    TELEMETRY reviewed by me (LT) 07/06/2023 : AF RVR rate 100s - 110s  EKG reviewed by me: AF frequent PVC rate 122  Data reviewed  by me (LT) 07/06/2023:  admission H&P last 24h vitals tele labs imaging I/O    Principal Problem:   Acute hypoxic respiratory failure (HCC) Active Problems:   COPD (chronic obstructive pulmonary disease) (HCC)   Cigarette smoker   Paroxysmal atrial fibrillation with RVR (HCC)   Acute on chronic combined systolic and diastolic CHF (congestive heart failure) (HCC)   HTN (hypertension)    ASSESSMENT AND PLAN:  Kenneth Benson is a 38yoM with a PMH of asthma, COPD, ongoing tobacco use, 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  Cardiology is consulted for further assistance.  # Acute hypoxic respiratory failure # COPD exacerbation Diffuse wheezing on exam today, on IV solumedrol and duonebs per primary team   # Paroxysmal atrial fibrillation with RVR Likely provoked in the setting of COPD exacerbation vs running out of home diltiazem and inability to afford refills.  Rate better controlled in the 100s-110s today - s/p IV diltizem & amiodarone IV. Not a great long term medication with underlying COPD/asthma - consolidate metoprolol tartrate to metoprolol XL 50 BID  - continue PO diltiazem 30mg  q6h - continue eliquis 5mg  BID   # acute on chronic HFmrEF Not significantly clinically hypervolemic on exam, BNP 600s on admission - s/p 60mg  IV lasix x 1 with resolution of peripheral edema.  - on torsemide 40mg  daily  - continue spiro 12.5mg  daily - continue jardiance 10mg  daily  - borderline low BP precludes ARB/ARNI  # tobacco abuse Encouraged complete cessation.   This patient's plan of care was discussed and created with Dr. Juliann Pares and he is in agreement.  Signed: Rebeca Allegra , PA-C 07/06/2023, 10:13 AM [facility] Cardiology

## 2023-07-06 NOTE — Plan of Care (Signed)
  Problem: Education: Goal: Ability to demonstrate management of disease process will improve Outcome: Progressing Goal: Ability to verbalize understanding of medication therapies will improve Outcome: Progressing Goal: Individualized Educational Video(s) Outcome: Progressing   Problem: Activity: Goal: Capacity to carry out activities will improve Outcome: Progressing   Problem: Cardiac: Goal: Ability to achieve and maintain adequate cardiopulmonary perfusion will improve Outcome: Progressing   Problem: Education: Goal: Knowledge of disease or condition will improve Outcome: Progressing Goal: Knowledge of the prescribed therapeutic regimen will improve Outcome: Progressing Goal: Individualized Educational Video(s) Outcome: Progressing   Problem: Activity: Goal: Ability to tolerate increased activity will improve Outcome: Progressing Goal: Will verbalize the importance of balancing activity with adequate rest periods Outcome: Progressing   Problem: Respiratory: Goal: Ability to maintain a clear airway will improve Outcome: Progressing Goal: Levels of oxygenation will improve Outcome: Progressing Goal: Ability to maintain adequate ventilation will improve Outcome: Progressing   Problem: Education: Goal: Knowledge of General Education information will improve Description: Including pain rating scale, medication(s)/side effects and non-pharmacologic comfort measures Outcome: Progressing   Problem: Health Behavior/Discharge Planning: Goal: Ability to manage health-related needs will improve Outcome: Progressing   Problem: Clinical Measurements: Goal: Ability to maintain clinical measurements within normal limits will improve Outcome: Progressing Goal: Will remain free from infection Outcome: Progressing Goal: Diagnostic test results will improve Outcome: Progressing Goal: Respiratory complications will improve Outcome: Progressing Goal: Cardiovascular complication  will be avoided Outcome: Progressing   Problem: Activity: Goal: Risk for activity intolerance will decrease Outcome: Progressing   Problem: Nutrition: Goal: Adequate nutrition will be maintained Outcome: Progressing   Problem: Coping: Goal: Level of anxiety will decrease Outcome: Progressing   Problem: Elimination: Goal: Will not experience complications related to bowel motility Outcome: Progressing Goal: Will not experience complications related to urinary retention Outcome: Progressing   Problem: Pain Managment: Goal: General experience of comfort will improve Outcome: Progressing   Problem: Safety: Goal: Ability to remain free from injury will improve Outcome: Progressing   Problem: Skin Integrity: Goal: Risk for impaired skin integrity will decrease Outcome: Progressing   Problem: Education: Goal: Ability to describe self-care measures that may prevent or decrease complications (Diabetes Survival Skills Education) will improve Outcome: Progressing Goal: Individualized Educational Video(s) Outcome: Progressing   Problem: Coping: Goal: Ability to adjust to condition or change in health will improve Outcome: Progressing   Problem: Fluid Volume: Goal: Ability to maintain a balanced intake and output will improve Outcome: Progressing   Problem: Health Behavior/Discharge Planning: Goal: Ability to identify and utilize available resources and services will improve Outcome: Progressing Goal: Ability to manage health-related needs will improve Outcome: Progressing   Problem: Metabolic: Goal: Ability to maintain appropriate glucose levels will improve Outcome: Progressing   Problem: Nutritional: Goal: Maintenance of adequate nutrition will improve Outcome: Progressing Goal: Progress toward achieving an optimal weight will improve Outcome: Progressing   Problem: Skin Integrity: Goal: Risk for impaired skin integrity will decrease Outcome: Progressing    Problem: Tissue Perfusion: Goal: Adequacy of tissue perfusion will improve Outcome: Progressing   

## 2023-07-07 ENCOUNTER — Other Ambulatory Visit (HOSPITAL_COMMUNITY): Payer: Self-pay

## 2023-07-07 LAB — GLUCOSE, CAPILLARY
Glucose-Capillary: 135 mg/dL — ABNORMAL HIGH (ref 70–99)
Glucose-Capillary: 142 mg/dL — ABNORMAL HIGH (ref 70–99)
Glucose-Capillary: 124 mg/dL — ABNORMAL HIGH (ref 70–99)

## 2023-07-07 MED ORDER — DILTIAZEM HCL 30 MG PO TABS
60.0000 mg | ORAL_TABLET | Freq: Four times a day (QID) | ORAL | Status: DC
  Administered 2023-07-07 – 2023-07-09 (×5): 60 mg via ORAL
  Filled 2023-07-07 (×6): qty 2

## 2023-07-07 NOTE — Plan of Care (Signed)
  Problem: Education: Goal: Ability to demonstrate management of disease process will improve Outcome: Progressing Goal: Ability to verbalize understanding of medication therapies will improve Outcome: Progressing Goal: Individualized Educational Video(s) Outcome: Progressing   Problem: Activity: Goal: Capacity to carry out activities will improve Outcome: Progressing   Problem: Cardiac: Goal: Ability to achieve and maintain adequate cardiopulmonary perfusion will improve Outcome: Progressing   Problem: Education: Goal: Knowledge of disease or condition will improve Outcome: Progressing Goal: Knowledge of the prescribed therapeutic regimen will improve Outcome: Progressing Goal: Individualized Educational Video(s) Outcome: Progressing   Problem: Activity: Goal: Ability to tolerate increased activity will improve Outcome: Progressing Goal: Will verbalize the importance of balancing activity with adequate rest periods Outcome: Progressing   Problem: Respiratory: Goal: Ability to maintain a clear airway will improve Outcome: Progressing Goal: Levels of oxygenation will improve Outcome: Progressing Goal: Ability to maintain adequate ventilation will improve Outcome: Progressing   Problem: Education: Goal: Knowledge of General Education information will improve Description: Including pain rating scale, medication(s)/side effects and non-pharmacologic comfort measures Outcome: Progressing   Problem: Health Behavior/Discharge Planning: Goal: Ability to manage health-related needs will improve Outcome: Progressing   Problem: Clinical Measurements: Goal: Ability to maintain clinical measurements within normal limits will improve Outcome: Progressing Goal: Will remain free from infection Outcome: Progressing Goal: Diagnostic test results will improve Outcome: Progressing Goal: Respiratory complications will improve Outcome: Progressing Goal: Cardiovascular complication  will be avoided Outcome: Progressing   Problem: Activity: Goal: Risk for activity intolerance will decrease Outcome: Progressing   Problem: Nutrition: Goal: Adequate nutrition will be maintained Outcome: Progressing   Problem: Coping: Goal: Level of anxiety will decrease Outcome: Progressing   Problem: Elimination: Goal: Will not experience complications related to bowel motility Outcome: Progressing Goal: Will not experience complications related to urinary retention Outcome: Progressing   Problem: Pain Managment: Goal: General experience of comfort will improve Outcome: Progressing   Problem: Safety: Goal: Ability to remain free from injury will improve Outcome: Progressing   Problem: Skin Integrity: Goal: Risk for impaired skin integrity will decrease Outcome: Progressing   Problem: Education: Goal: Ability to describe self-care measures that may prevent or decrease complications (Diabetes Survival Skills Education) will improve Outcome: Progressing Goal: Individualized Educational Video(s) Outcome: Progressing   Problem: Coping: Goal: Ability to adjust to condition or change in health will improve Outcome: Progressing   Problem: Fluid Volume: Goal: Ability to maintain a balanced intake and output will improve Outcome: Progressing   Problem: Health Behavior/Discharge Planning: Goal: Ability to identify and utilize available resources and services will improve Outcome: Progressing Goal: Ability to manage health-related needs will improve Outcome: Progressing   Problem: Metabolic: Goal: Ability to maintain appropriate glucose levels will improve Outcome: Progressing   Problem: Nutritional: Goal: Maintenance of adequate nutrition will improve Outcome: Progressing Goal: Progress toward achieving an optimal weight will improve Outcome: Progressing   Problem: Skin Integrity: Goal: Risk for impaired skin integrity will decrease Outcome: Progressing    Problem: Tissue Perfusion: Goal: Adequacy of tissue perfusion will improve Outcome: Progressing   

## 2023-07-07 NOTE — Progress Notes (Signed)
[facility] CLINIC CARDIOLOGY CONSULT NOTE       Patient ID: Kenneth Benson MRN: [id number] DOB/AGE: [birth year] 62 y.o.  Admit date: 07/02/2023 Referring Physician Dr. Joylene Igo Primary Physician none Primary Cardiologist previous Dr. 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  Cardiology is consulted for further assistance.  Interval History:  -Continues to feel slightly better, less wheezing and cough.  Has pleuritic chest tightness with deep inspiration without overt anginal chest pain, no heart racing or palpitations. -Remains in AF on telemetry with average rate 90s to 110s, gradually improving each day -Diuresing well  Review of systems complete and found to be negative unless listed above     Past Medical History:  Diagnosis Date   A-fib (HCC) 02/06/2022   AKI (acute kidney injury) (HCC) 12/17/2022   Arrhythmia    atrial fibrillation   Asthma    CHF (congestive heart failure) (HCC)    COPD (chronic obstructive pulmonary disease) (HCC)    Hyperkalemia 12/17/2022   Hypokalemia 04/13/2023   Myocardial injury 04/13/2023   Tobacco abuse     History reviewed. No pertinent surgical history.  Medications Prior to Admission  Medication Sig Dispense Refill Last Dose   albuterol (VENTOLIN HFA) 108 (90 Base) MCG/ACT inhaler Inhale 2 puffs into the lungs every 6 (six) hours as needed for wheezing or shortness of breath. 6.7 g 2 prn   apixaban (ELIQUIS) 5 MG TABS tablet Take 1 tablet (5 mg total) by mouth 2 (two) times daily. 60 tablet 1 07/02/2023 at 0730   JARDIANCE 10 MG TABS tablet Take 10 mg by mouth daily.   07/02/2023 at 0730   losartan (COZAAR) 25 MG tablet Take 0.5 tablets (12.5 mg total) by mouth daily. 15 tablet 1 07/02/2023 at 0730   metoprolol tartrate (LOPRESSOR) 50 MG  tablet Take 1 tablet (50 mg total) by mouth 2 (two) times daily. 60 tablet 1 07/02/2023 at 0730   torsemide (DEMADEX) 20 MG tablet Take 2 tablets (40 mg total) by mouth daily. 60 tablet 1 07/02/2023 at 0730   albuterol (PROVENTIL) (2.5 MG/3ML) 0.083% nebulizer solution Take 3 mLs (2.5 mg total) by nebulization every 6 (six) hours as needed for wheezing or shortness of breath. (Patient not taking: Reported on 07/02/2023) 720 mL 0 Not Taking   arformoterol (BROVANA) 15 MCG/2ML NEBU Take 2 mLs (15 mcg total) by nebulization 2 (two) times daily. (Patient not taking: Reported on 05/11/2023) 120 mL 1    budesonide (PULMICORT) 0.5 MG/2ML nebulizer solution Take 2 mLs (0.5 mg total) by nebulization 2 (two) times daily. (Patient not taking: Reported on 05/11/2023) 120 mL 1    diltiazem (CARDIZEM CD) 360 MG 24 hr capsule Take 1 capsule (360 mg total) by mouth daily. (Patient not taking: Reported on 07/02/2023) 30 capsule 1 Not Taking   Social History   Socioeconomic History   Marital status: Single    Spouse name: Not on file   Number of children: Not on file   Years of education: Not on file   Highest education level: Not on file  Occupational History   Not on file  Tobacco Use   Smoking status: Every Day    Current packs/day: 1.50    Average packs/day: 1.5 packs/day for 27.0 years (40.5 ttl pk-yrs)  Types: Cigarettes   Smokeless tobacco: Not on file   Tobacco comments:    0.5PPD at the most.  trying to quit.  05/11/2023 hfb  Vaping Use   Vaping status: Never Used  Substance and Sexual Activity   Alcohol use: No   Drug use: No   Sexual activity: Not on file  Other Topics Concern   Not on file  Social History Narrative   Not on file   Social Determinants of Health   Financial Resource Strain: Not on file  Food Insecurity: No Food Insecurity (07/03/2023)   Hunger Vital Sign    Worried About Running Out of Food in the Last Year: Never true    Ran Out of Food in the Last Year: Never true   Transportation Needs: No Transportation Needs (07/03/2023)   PRAPARE - Administrator, Civil Service (Medical): No    Lack of Transportation (Non-Medical): No  Physical Activity: Not on file  Stress: Not on file  Social Connections: Not on file  Intimate Partner Violence: Not At Risk (07/03/2023)   Humiliation, Afraid, Rape, and Kick questionnaire    Fear of Current or Ex-Partner: No    Emotionally Abused: No    Physically Abused: No    Sexually Abused: No    Family History  Problem Relation Age of Onset   Heart disease Mother    Atrial fibrillation Father       Intake/Output Summary (Last 24 hours) at 07/07/2023 0755 Last data filed at 07/07/2023 0511 Gross per 24 hour  Intake 2060 ml  Output 5600 ml  Net -3540 ml    Vitals:   07/06/23 2345 07/06/23 2358 07/07/23 0452 07/07/23 0509  BP:   112/84   Pulse: (!) 111 63 (!) 47 (!) 101  Resp:   20   Temp:   98.4 F (36.9 C)   TempSrc:   Oral   SpO2:  91% 95%   Weight:      Height:        PHYSICAL EXAM General: middle aged male , well nourished, in no acute distress. Laying flat in bed HEENT:  Normocephalic and atraumatic. Neck:  No JVD.  Lungs: slight conversational dyspnea on O2 by [place].  Decreased breath sounds with trace wheezing, overall improved compared to yesterday. Heart: irregular irregular with controlled rate. Normal S1 and S2 without gallops or murmurs.  Abdomen: Non-distended appearing.  Msk: Normal strength and tone for age. Extremities: Warm and well perfused. No clubbing, cyanosis. No LE edema.  Neuro: Alert and oriented X 3. Psych:  Answers questions appropriately.   Labs: Basic Metabolic Panel: Recent Labs    07/05/23 0555 07/06/23 0535 07/07/23 0619  NA 140 139 139  K 3.7 3.5 4.1  CL 98 94* 95*  CO2 35* 36* 37*  GLUCOSE 92 81 107*  BUN 33* 32* 29*  CREATININE 1.00 1.07 0.99  CALCIUM 8.4* 8.3* 8.5*  MG 2.4  --   --    Liver Function Tests: No results for input(s): "AST", "ALT",  "ALKPHOS", "BILITOT", "PROT", "ALBUMIN" in the last 72 hours.  No results for input(s): "LIPASE", "AMYLASE" in the last 72 hours. CBC: Recent Labs    07/06/23 0535 07/07/23 0619  WBC 8.2 8.2  HGB 13.9 15.5  HCT 43.7 47.8  MCV 88.5 86.0  PLT 201 188   Cardiac Enzymes: Recent Labs    07/04/23 1422 07/04/23 1620  TROPONINIHS 12 11   BNP: No results for input(s): "BNP" in the  last 72 hours.  D-Dimer: No results for input(s): "DDIMER" in the last 72 hours. Hemoglobin A1C: No results for input(s): "HGBA1C" in the last 72 hours. Fasting Lipid Panel: No results for input(s): "CHOL", "HDL", "LDLCALC", "TRIG", "CHOLHDL", "LDLDIRECT" in the last 72 hours. Thyroid Function Tests: No results for input(s): "TSH", "T4TOTAL", "T3FREE", "THYROIDAB" in the last 72 hours.  Invalid input(s): "FREET3" Anemia Panel: No results for input(s): "VITAMINB12", "FOLATE", "FERRITIN", "TIBC", "IRON", "RETICCTPCT" in the last 72 hours.   Radiology: US SCROTUM W/DOPPLER  Result Date: 07/06/2023 CLINICAL DATA:  [id number] Inguinal hernia [id number] Testicle swelling [id number] EXAM: SCROTAL ULTRASOUND DOPPLER ULTRASOUND OF THE TESTICLES TECHNIQUE: Complete ultrasound examination of the testicles, epididymis, and other scrotal structures was performed. Color and spectral Doppler ultrasound were also utilized to evaluate blood flow to the testicles. COMPARISON:  CT angiography abdomen and pelvis from 12/16/2022. FINDINGS: Right testicle Measurements: 2.7 x 3.3 x 5.0 cm. No mass or microlithiasis visualized. Left testicle Measurements: 2.2 x 3.3 x 4.5 cm. No mass or microlithiasis visualized. There is an ill-defined heterogeneous and hypoechoic 4 x 6 x 15 mm area in the subcapsular portion of the lower pole of the testicle. This is of indeterminate etiology but favored to represent sequela of prior infection, inflammation or hematoma. Right epididymis:  Normal in size and appearance. Left epididymis:  Normal in size and  appearance. Hydrocele:  None visualized. Varicocele:  None visualized. Pulsed Doppler interrogation of both testes demonstrates normal low resistance arterial and venous waveforms bilaterally. Additional, evaluation of bilateral inguinal region was performed. There is a small-to-moderate fat containing right inguinal hernia and small-to-moderate fat and probable bowel containing left inguinal hernia. Please refer to CT scan abdomen and pelvis from 12/16/2022 for better visualization. IMPRESSION: *No evidence of testicular torsion. Bilateral inguinal hernias. Electronically Signed   By: Jules Schick M.D.   On: 07/06/2023 13:46   DG Chest Port 1 View  Result Date: 07/04/2023 CLINICAL DATA:  Shortness of breath.  Rapid response called. EXAM: PORTABLE CHEST 1 VIEW COMPARISON:  07/02/2023 FINDINGS: Single AP view of the chest demonstrates enlargement of the cardiac silhouette. Central vascular structures are mildly prominent but no overt pulmonary edema. Negative for a pneumothorax. No focal airspace disease or lung consolidation. The trachea is midline. IMPRESSION: Enlargement of the cardiac silhouette without overt pulmonary edema. Electronically Signed   By: Richarda Overlie M.D.   On: 07/04/2023 15:12   DG HIP PORT UNILAT WITH PELVIS 1V LEFT  Result Date: 07/04/2023 CLINICAL DATA:  Chronic left hip pain. EXAM: DG HIP (WITH OR WITHOUT PELVIS) 1V PORT LEFT COMPARISON:  None Available. FINDINGS: Mild-to-moderate left and mild right femoroacetabular joint space narrowing. Mild to moderate bilateral femoral head-neck junction degenerative osteophytosis. Mild bilateral superolateral acetabular degenerative osteophytosis. Mild bilateral sacroiliac subchondral sclerosis. The pubic symphysis joint space is maintained. No acute fracture or dislocation. 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 Electronically Signed   By: Neita Garnet M.D.   On: 07/04/2023 11:58   CT Angio Chest  Pulmonary Embolism (PE) W or WO Contrast  Result Date: 07/02/2023 CLINICAL DATA:  Audible wheezing. EXAM: CT ANGIOGRAPHY CHEST WITH CONTRAST TECHNIQUE: Multidetector CT imaging of the chest was performed using the standard protocol during bolus administration of intravenous contrast. Multiplanar CT image reconstructions and MIPs were obtained to evaluate the vascular anatomy. RADIATION DOSE REDUCTION: This exam was performed according to the departmental dose-optimization program which includes automated exposure control, adjustment of the mA and/or kV according to patient  size and/or use of iterative reconstruction technique. CONTRAST:  OMNIPAQUE IOHEXOL 350 MG/ML SOLN COMPARISON:  Apr 13, 2023 FINDINGS: Cardiovascular: Thoracic aorta is normal in appearance. Satisfactory opacification of the pulmonary arteries to the segmental level. No evidence of pulmonary embolism. There is mild cardiomegaly with mild coronary artery calcification. No pericardial effusion. Mediastinum/Nodes: No enlarged mediastinal, hilar, or axillary lymph nodes. Thyroid gland, trachea, and esophagus demonstrate no significant findings. Lungs/Pleura: Mild lingular, right middle lobe and bibasilar atelectasis is seen. A stable 4 mm left upper lobe lung nodule is noted. There is no evidence of acute infiltrate, pleural effusion or pneumothorax. Upper Abdomen: A punctate gallstone is seen within an otherwise normal-appearing gallbladder. Musculoskeletal: No chest wall abnormality. No acute or significant osseous findings. Review of the MIP images confirms the above findings. IMPRESSION: 1. No evidence of pulmonary embolism. 2. Mild lingular, right middle lobe and bibasilar atelectasis. 3. Stable 4 mm left upper lobe pulmonary nodule. No follow-up needed if patient is low-risk.This recommendation follows the consensus statement: Guidelines for Management of Incidental Pulmonary Nodules Detected on CT Images: From the Fleischner Society  2017; Radiology 2017; 284:228-243. 4. Cholelithiasis. Electronically Signed   By: Aram Candela M.D.   On: 07/02/2023 21:16   DG Chest 2 View  Result Date: 07/02/2023 CLINICAL DATA:  Shortness of breath. EXAM: CHEST - 2 VIEW COMPARISON:  Chest x-ray dated Apr 16, 2023. FINDINGS: Unchanged cardiomegaly. Normal pulmonary vascularity. Mild streaky opacities at both lung bases consistent with atelectasis. No focal consolidation, pleural effusion, or pneumothorax. No acute osseous abnormality. IMPRESSION: Mild bibasilar atelectasis. Electronically Signed   By: Obie Dredge M.D.   On: 07/02/2023 15:00    ECHO 12/18/2022  1. Left ventricular ejection fraction, by estimation, is 45 to 50%. The  left ventricle has mildly decreased function. The left ventricle has no  regional wall motion abnormalities. The left ventricular internal cavity  size was mildly dilated. Left  ventricular diastolic parameters were normal.   2. Right ventricular systolic function is normal. The right ventricular  size is normal. There is mildly elevated pulmonary artery systolic  pressure.   3. The mitral valve is normal in structure. Moderate mitral valve  regurgitation. No evidence of mitral stenosis.   4. Tricuspid valve regurgitation is moderate.   5. The aortic valve is normal in structure. Aortic valve regurgitation is  not visualized. No aortic stenosis is present.   6. The inferior vena cava is normal in size with greater than 50%  respiratory variability, suggesting right atrial pressure of 3 mmHg.    TELEMETRY reviewed by me (LT) 07/07/2023 : AF RVR rate 90s to low 110s  EKG reviewed by me: AF frequent PVC rate 122  Data reviewed by me (LT) 07/07/2023: Hospitalist progress notes, pulmonology progress note, surgery consult note last 24h vitals tele labs imaging I/O    Principal Problem:   Acute hypoxic respiratory failure (HCC) Active Problems:   COPD (chronic obstructive pulmonary disease) (HCC)    Cigarette smoker   Paroxysmal atrial fibrillation with RVR (HCC)   Acute on chronic combined systolic and diastolic CHF (congestive heart failure) (HCC)   HTN (hypertension)    ASSESSMENT AND PLAN:  Kenneth Benson is a 73yoM with a PMH of asthma, COPD, ongoing tobacco use, 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  Cardiology is consulted for further assistance.  # Acute hypoxic respiratory failure # COPD exacerbation Less wheezing  today, remains on supplemental oxygen, -Agree with current therapy per primary, pulmonology.  # Paroxysmal atrial fibrillation with RVR Likely provoked in the setting of COPD exacerbation vs running out of home diltiazem and inability to afford refills.  Rate better controlled in the 100s-110s today - s/p IV diltizem & amiodarone IV. Not a great long term medication with underlying COPD/asthma -Continue metoprolol XL 50 BID  -Increase PO diltiazem from 30mg  to 60 mg q6h with goals to consolidate by discharge - continue eliquis 5mg  BID   # acute on chronic HFmrEF Not significantly clinically hypervolemic on exam but continues to have significant urine output, suggesting excess volume. BNP 600s on admission. Net IO Since Admission: -9,462.25 mL [07/07/23 1021]  - s/p 60mg  IV lasix x 1 with resolution of peripheral edema.  - on torsemide 40mg  daily  - continue spiro 12.5mg  daily - continue jardiance 10mg  daily  - borderline low BP precludes ARB/ARNI  # tobacco abuse Encouraged complete cessation.   This patient's plan of care was discussed and created with Dr. Juliann Pares and he is in agreement.  Signed: Rebeca Allegra , PA-C 07/07/2023, 7:55 AM [facility]

## 2023-07-07 NOTE — Progress Notes (Signed)
[facility] HF Stewardship  PCP: Pcp, No  PCP-Cardiologist: None  HPI: Kenneth Benson is a 62 y.o. male with COPD, Afib, CHF, HTN who presented with shortness of breath.   ECHO 12/2021 showed normal LVEF. Most recent ECHO 11/2022 showed LVEF reduced to 45-50% with moderate MR, moderate TR, and normal diastolic function. Admitted 03/2023 with COPD exacerbation.   Now admitted with combined CHF and COPD exacerbations. Initial presentation with pitting edema, elevated JVP, elevated BNP, and rales per MD. Stay complicated by AF with elevated ventricular rate. PTA losartan and metoprolol held due to hypotension. Initial diuresis with IV Lasix, now transitioned to torsemide. Rapid response called on 07/04/23 for tachycardia and increased work of breathing. Steroids have been converted from IV to PO taper.  Pertinent Lab Values: BUN  Date Value Ref Range Status  07/07/2023 29 (H) 6 - 20 mg/dL Final   Potassium  Date Value Ref Range Status  07/07/2023 4.1 3.5 - 5.1 mmol/L Final   Sodium  Date Value Ref Range Status  07/07/2023 139 135 - 145 mmol/L Final   B Natriuretic Peptide  Date Value Ref Range Status  07/02/2023 661.0 (H) 0.0 - 100.0 pg/mL Final    Comment:    Performed at [facility], [address] [place], [place] [address]   TSH  Date Value Ref Range Status  12/16/2022 1.505 0.350 - 4.500 uIU/mL Final    Comment:    Performed by a 3rd Generation assay with a functional sensitivity of <=0.01 uIU/mL. Performed at [facility], [address] [place], [place] [address]     Vital Signs: Temp:  [98 F (36.7 C)-98.5 F (36.9 C)] 98.4 F (36.9 C) (08/09 0452) Pulse Rate:  [41-153] 101 (08/09 0509) Cardiac Rhythm: Atrial fibrillation (08/09 0759) Resp:  [18-22] 20 (08/09 0452) BP: (93-128)/(68-108) 112/84 (08/09 0452) SpO2:  [85 %-95 %] 95 % (08/09 0452)   Intake/Output Summary (Last 24 hours) at 07/07/2023 0800 Last data filed at 07/07/2023 0511 Gross per 24  hour  Intake 2060 ml  Output 5600 ml  Net -3540 ml    Current HF Medications:  Jardiance 10 mg daily Torsemide 40 mg daily  Prior to admission HF Medications:  Metoprolol tartrate 50 mg BID Jardiance 10 mg daily Losartan 12.5 mg daily Torsemide 40 mg daily  Assessment: 1. 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 NYHA class II-III symptoms.   -SBP ~100. HR variable from 40s to 100s. Excellent urine output. -Significant improvement in peripheral edema. Stable creatinine and BUN. Breathing slowly improving per patient report. Continue current diuretic dose with stable creatinine/BUN and symptom improvement. -PTA diltiazem cautioned with HFmrEF. If HR remains stable, can consider stopping at discharge in favor of beta blocker uptitration.  Plan: 1) Medication changes recommended at this time: - None - Maintain strict I/Os, daily weights, Mg >2, and K >4.  2) Patient assistance: -None required  3) Education: - Patient has been educated on current HF medications and potential additions to HF medication regimen - Patient verbalizes understanding that over the next few months, these medication doses may change and more medications may be added to optimize HF regimen - Patient has been educated on basic disease state pathophysiology and goals of therapy   Medication Assistance / Insurance Benefits Check:  Does the patient have prescription insurance? Prescription Insurance: MedicaidYes  Type of insurance plan: [place] Medicaid  Does the patient qualify for medication assistance through manufacturers or grants? No  Sherryll Burger copay: $4  Jardiance copay: $4  Outpatient Pharmacy:  Prior to admission [facility]  Is the patient willing to use [facility] at discharge? Yes  Is the patient willing to transition their outpatient pharmacy to utilize a [facility] outpatient pharmacy?  Agreeable to switch to [facility] Pharmacy?: Yes. Would like to start mail order from [facility]  Thank you for involving pharmacy in this patient's care.  Enos Fling, PharmD, BCPS Clinical Pharmacist 07/07/2023 8:00 AM

## 2023-07-07 NOTE — Progress Notes (Signed)
Progress Note   Patient: Kenneth Benson:[id number] DOB: [birth year] DOA: 07/02/2023     5 DOS: the patient was seen and examined on 07/07/2023   Brief hospital course:  Kenneth Benson is a 62 y.o. male with medical history significant of CHF with EF of 45-50%, hypertension, COPD/asthma, atrial fibrillation on Eliquis, obesity, tobacco abuse, who presents with shortness of breath.Patient states that he has shortness of breath which has been progressively worsening over past 2 weeks. He has mild bilateral leg edema.  Patient is not using oxygen normally, found to have oxygen saturation 91% on room air, which improved to 99% on 3 L oxygen.  Patient states that he has not been taking his diltiazem for the past few weeks as he has run out.  HPI is limited as patient is tachycardic in A-fib RVR movement and speaking heart rate is elevated.  Patient does continue to smoke.  Denies any chest pain currently. In the emergency room: Patient is alert awake oriented mild distress due to tachycardia.  Vitals show bradycardia initially with respirations of 26 and normal blood pressure which has eventually changed to heart rate of 144 A-fib RVR respirations of 26 blood pressure of 103/88 O2 sats of 95% on 3 L. Venous blood gas-ordered and pending. BMP shows glucose 146 normal kidney function. Hepatic function test normal. BNP of 661/troponin of 14/repeat troponin of 13. Procalcitonin of less than 10. CBC within normal limits 15.8 normal white count normal platelet count. Respiratory panel is negative for COVID and influenza.  Assessment and Plan:  Acute hypoxic respiratory failure secondary to acute COPD exacerbation: currently requiring 2LPM O2 [place]  Continue COPD management 6-minute walk to be assessed for home oxygen need prior to discharge   Paroxysmal A-fib with rapid ventricular rate: likely triggered from COPD.  fluctuating rate and rhythm Reviewed cardiologist note from today, appreciate their  help Continue Metoprolol 50mg  BID Increase cardizem to 60mg  Q6hrs Continue Eliquis 5mg  BID Cardiac monitoring  Acute COPD exacerbation: Improving Continue levalbuterol Q6hrs Continue prednisone taper regimen Continue oxygen therapy to keep O2 sats> 92% Appreciate pulmonary input- pt clear from pulmonology stand point   Chronic combined systolic and diastolic dysfunction CHF Stable and not acutely exacerbated Last 2D echocardiogram from 01/24 showed an LVEF of 45 to 50% Continue Jardiance, losartan, metoprolol and torsemide   Nicotine dependence Smoking cessation was discussed with patient's Continue nicotine transdermal patch   Obesity (BMI 36) Complicates overall prognosis and care Lifestyle modification and exercise has been discussed with patient in detail      Subjective: Patient is seen and examined at the bedside.  Physical Exam: Vitals:   07/07/23 0509 07/07/23 0824 07/07/23 1242 07/07/23 1347  BP:  99/70 95/78   Pulse: (!) 101 81 98   Resp:  20    Temp:  98.2 F (36.8 C) 98.2 F (36.8 C)   TempSrc:  Oral Oral   SpO2:  93% 92% 96%  Weight:      Height:       Vitals and nursing note reviewed.  Constitutional:      Appearance: He is well-developed.  HENT:     Head: Normocephalic and atraumatic.  Eyes:     Pupils: Pupils are equal, round, and reactive to light.  Cardiovascular:     Rate and Rhythm: Mild tachycardia present. Rhythm irregular.  Pulmonary:     Effort: Mild Diffuse wheezing but improved, normal effort Abdominal:     General: Bowel sounds are normal.  Palpations: Abdomen is soft.  Musculoskeletal:        General: Normal range of motion.     Cervical back: Normal range of motion and neck supple.  Skin:    General: Skin is warm and dry.  Neurological:     General: No focal deficit present.     Mental Status: He is alert.  Psychiatric:        Mood and Affect: Mood normal.        Behavior: Behavior normal.      Data Reviewed:   Latest Reference Range & Units 07/07/23 06:19  Sodium 135 - 145 mmol/L 139  Potassium 3.5 - 5.1 mmol/L 4.1  Chloride 98 - 111 mmol/L 95 (L)  CO2 22 - 32 mmol/L 37 (H)  Glucose 70 - 99 mg/dL 161 (H)  BUN 6 - 20 mg/dL 29 (H)  Creatinine 0.96 - 1.24 mg/dL 0.45  Calcium 8.9 - 40.9 mg/dL 8.5 (L)  Anion gap 5 - 15  7  GFR, Estimated >60 mL/min >60  WBC 4.0 - 10.5 K/uL 8.2  RBC 4.22 - 5.81 MIL/uL 5.56  Hemoglobin 13.0 - 17.0 g/dL 81.1  HCT 91.4 - 78.2 % 47.8  MCV 80.0 - 100.0 fL 86.0  MCH 26.0 - 34.0 pg 27.9  MCHC 30.0 - 36.0 g/dL 95.6  RDW 21.3 - 08.6 % 17.9 (H)  Platelets 150 - 400 K/uL 188  nRBC 0.0 - 0.2 % 0.0  (L): Data is abnormally low (H): Data is abnormally high  Family Communication: Plan of care discussed with patient at the bedside.  Disposition: Status is: Inpatient Remains inpatient appropriate because: Treatment for COPD exacerbation and rate control for A-fib  Planned Discharge Destination: Home    Time spent: 33 minutes  Author: Ernestene Mention, MD 07/07/2023 12:19 PM  For on call review [email or web address].

## 2023-07-07 NOTE — Progress Notes (Signed)
PULMONOLOGY         Date: 07/07/2023,   MRN# [id number] Kenneth Benson [birth year]     AdmissionWeight: 122.5 kg                 CurrentWeight: 128.4 kg  Referring provider: Dr Joylene Igo   CHIEF COMPLAINT:   Severe acute exacerbation of COPD   HISTORY OF PRESENT ILLNESS   62 year old male with history of A-fib, AKI, dysrhythmia, chronic asthma, CHF, lifelong smoking history with COPD and chronic hypoxemia, previous MRI who came in with chief complaint of dyspnea and progressively worsening shortness of breath.  Patient admits to noncompliance with supplemental oxygen at home.  Was found to be in A-fib RVR on presentation.  Required 3 L of oxygen to reach normoxia.  Initial blood work showed elevation of BNP suggestive of cardiac stress.  He was tested for COVID-19 and influenza on admission which were both negative.  His most recent echo shows reduced ejection fraction of 45% PCCM consultation for further evaluation management.   07/05/23-patient is now on 2L/min [place] reports feeling better.  Had bath today shares he's refreshed. Overall partial improvement in past 24hr. He is 2.6L net negative. LE edema is now trace. He is being seen by cardiology. Steroids reduced from solumedrol to Prednisone with daily tapering by 5mg .    07/06/23- patient is up in chair s/p PT today.  He is on 2L/min reports feeling better.  He still has scrotal edema but LE edema has resolved. He is on tapering dose of prednisone.   07/07/23- patient getting close to baseline.  He is cleared for dc home from pulmonary perspective.  He may follow up with [place] at [facility] pulmonary post dc home.  I would recommend to come off prednisone by weaning it by 5mg  daily and stay on zithromax 250mg  daily until then   PAST MEDICAL HISTORY   Past Medical History:  Diagnosis Date   A-fib (HCC) 02/06/2022   AKI (acute kidney injury) (HCC) 12/17/2022   Arrhythmia    atrial fibrillation   Asthma    CHF (congestive heart failure)  (HCC)    COPD (chronic obstructive pulmonary disease) (HCC)    Hyperkalemia 12/17/2022   Hypokalemia 04/13/2023   Myocardial injury 04/13/2023   Tobacco abuse      SURGICAL HISTORY   History reviewed. No pertinent surgical history.   FAMILY HISTORY   Family History  Problem Relation Age of Onset   Heart disease Mother    Atrial fibrillation Father      SOCIAL HISTORY   Social History   Tobacco Use   Smoking status: Every Day    Current packs/day: 1.50    Average packs/day: 1.5 packs/day for 27.0 years (40.5 ttl pk-yrs)    Types: Cigarettes   Tobacco comments:    0.5PPD at the most.  trying to quit.  05/11/2023 hfb  Vaping Use   Vaping status: Never Used  Substance Use Topics   Alcohol use: No   Drug use: No     MEDICATIONS    Home Medication:    Current Medication:  Current Facility-Administered Medications:    acetaminophen (TYLENOL) tablet 650 mg, 650 mg, Oral, Q6H PRN, 650 mg at 07/04/23 0833 **OR** acetaminophen (TYLENOL) suppository 650 mg, 650 mg, Rectal, Q6H PRN, [name], Ekta V, MD   albuterol (PROVENTIL) (2.5 MG/3ML) 0.083% nebulizer solution 2.5 mg, 2.5 mg, Nebulization, Q2H PRN, Gertha Calkin, MD   apixaban (ELIQUIS) tablet 5 mg, 5  mg, Oral, BID, Gertha Calkin, MD, 5 mg at 07/06/23 2108   bisacodyl (DULCOLAX) EC tablet 5 mg, 5 mg, Oral, Daily PRN, Gertha Calkin, MD   diltiazem (CARDIZEM) tablet 60 mg, 60 mg, Oral, Q6H, Tang, Cheryln Manly, PA-C   docusate sodium (COLACE) capsule 100 mg, 100 mg, Oral, BID, Irena Cords V, MD, 100 mg at 07/06/23 2108   empagliflozin (JARDIANCE) tablet 10 mg, 10 mg, Oral, Daily, Agbata, Tochukwu, MD, 10 mg at 07/06/23 0844   guaiFENesin (MUCINEX) 12 hr tablet 600 mg, 600 mg, Oral, BID PRN, Gertha Calkin, MD   hydrALAZINE (APRESOLINE) injection 5 mg, 5 mg, Intravenous, Q4H PRN, Gertha Calkin, MD   HYDROcodone-acetaminophen (NORCO/VICODIN) 5-325 MG per tablet 1-2 tablet, 1-2 tablet, Oral, Q4H PRN, Gertha Calkin, MD    insulin aspart (novoLOG) injection 0-15 Units, 0-15 Units, Subcutaneous, TID WC, Agbata, Tochukwu, MD, 3 Units at 07/06/23 1731   ipratropium (ATROVENT) nebulizer solution 0.5 mg, 0.5 mg, Nebulization, Q6H, Agbata, Tochukwu, MD, 0.5 mg at 07/07/23 0742   levalbuterol (XOPENEX) nebulizer solution 0.63 mg, 0.63 mg, Nebulization, Q6H, Agbata, Tochukwu, MD, 0.63 mg at 07/07/23 0742   metoprolol succinate (TOPROL-XL) 24 hr tablet 50 mg, 50 mg, Oral, BID, Tang, Lily Michelle, PA-C, 50 mg at 07/06/23 2108   morphine (PF) 2 MG/ML injection 2 mg, 2 mg, Intravenous, Q2H PRN, Gertha Calkin, MD   nicotine (NICODERM CQ - dosed in mg/24 hours) patch 14 mg, 14 mg, Transdermal, Daily, Irena Cords V, MD, 14 mg at 07/02/23 2001   ondansetron (ZOFRAN) tablet 4 mg, 4 mg, Oral, Q6H PRN **OR** ondansetron (ZOFRAN) injection 4 mg, 4 mg, Intravenous, Q6H PRN, Gertha Calkin, MD   Oral care mouth rinse, 15 mL, Mouth Rinse, PRN, Agbata, Tochukwu, MD   polyethylene glycol (MIRALAX / GLYCOLAX) packet 17 g, 17 g, Oral, Daily PRN, Gertha Calkin, MD   [COMPLETED] predniSONE (DELTASONE) tablet 50 mg, 50 mg, Oral, Q breakfast, 50 mg at 07/06/23 0844 **FOLLOWED BY** predniSONE (DELTASONE) tablet 45 mg, 45 mg, Oral, Q breakfast **FOLLOWED BY** [START ON 07/08/2023] predniSONE (DELTASONE) tablet 40 mg, 40 mg, Oral, Q breakfast **FOLLOWED BY** [START ON 07/09/2023] predniSONE (DELTASONE) tablet 35 mg, 35 mg, Oral, Q breakfast **FOLLOWED BY** [START ON 07/10/2023] predniSONE (DELTASONE) tablet 30 mg, 30 mg, Oral, Q breakfast **FOLLOWED BY** [START ON 07/11/2023] predniSONE (DELTASONE) tablet 25 mg, 25 mg, Oral, Q breakfast **FOLLOWED BY** [START ON 07/12/2023] predniSONE (DELTASONE) tablet 20 mg, 20 mg, Oral, Q breakfast **FOLLOWED BY** [START ON 07/13/2023] predniSONE (DELTASONE) tablet 15 mg, 15 mg, Oral, Q breakfast **FOLLOWED BY** [START ON 07/14/2023] predniSONE (DELTASONE) tablet 10 mg, 10 mg, Oral, Q breakfast **FOLLOWED BY** [START ON  07/15/2023] predniSONE (DELTASONE) tablet 5 mg, 5 mg, Oral, Q breakfast, , , MD   sodium chloride flush (NS) 0.9 % injection 3 mL, 3 mL, Intravenous, Q12H, Irena Cords V, MD, 3 mL at 07/06/23 2108   spironolactone (ALDACTONE) tablet 12.5 mg, 12.5 mg, Oral, Daily, Tang, Lily Michelle, PA-C, 12.5 mg at 07/06/23 0844   torsemide (DEMADEX) tablet 40 mg, 40 mg, Oral, Daily, Agbata, Tochukwu, MD, 40 mg at 07/06/23 0844    ALLERGIES   Patient has no known allergies.     REVIEW OF SYSTEMS    Review of Systems:  Gen:  Denies  fever, sweats, chills weigh loss  HEENT: Denies blurred vision, double vision, ear pain, eye pain, hearing loss, nose bleeds, sore throat Cardiac:  No dizziness, chest pain  or heaviness, chest tightness,edema Resp:   reports dyspnea chronically  Gi: Denies swallowing difficulty, stomach pain, nausea or vomiting, diarrhea, constipation, bowel incontinence Gu:  Denies bladder incontinence, burning urine Ext:   Denies Joint pain, stiffness or swelling Skin: Denies  skin rash, easy bruising or bleeding or hives Endoc:  Denies polyuria, polydipsia , polyphagia or weight change Psych:   Denies depression, insomnia or hallucinations   Other:  All other systems negative   VS: BP 112/84 (BP Location: Left Arm)   Pulse (!) 101 Comment: pulse on telemetry monitor - does not match pulse oximetry  Temp 98.4 F (36.9 C) (Oral)   Resp 20   Ht 6' (1.829 m)   Wt 128.4 kg   SpO2 95%   BMI 38.40 kg/m      PHYSICAL EXAM    GENERAL:NAD, no fevers, chills, no weakness no fatigue HEAD: Normocephalic, atraumatic.  EYES: Pupils equal, round, reactive to light. Extraocular muscles intact. No scleral icterus.  MOUTH: Moist mucosal membrane. Dentition intact. No abscess noted.  EAR, NOSE, THROAT: Clear without exudates. No external lesions.  NECK: Supple. No thyromegaly. No nodules. No JVD.  PULMONARY: decreased breath sounds with mild rhonchi worse at bases  bilaterally.  CARDIOVASCULAR: S1 and S2. Regular rate and rhythm. No murmurs, rubs, or gallops. No edema. Pedal pulses 2+ bilaterally.  GASTROINTESTINAL: Soft, nontender, nondistended. No masses. Positive bowel sounds. No hepatosplenomegaly.  MUSCULOSKELETAL: No swelling, clubbing, or edema. Range of motion full in all extremities.  NEUROLOGIC: Cranial nerves II through XII are intact. No gross focal neurological deficits. Sensation intact. Reflexes intact.  SKIN: No ulceration, lesions, rashes, or cyanosis. Skin warm and dry. Turgor intact.  PSYCHIATRIC: Mood, affect within normal limits. The patient is awake, alert and oriented x 3. Insight, judgment intact.       IMAGING      ASSESSMENT/PLAN   Acute on chronic hypoxemic respiratory failure -there is interstitial edema, atelectasis bilaterally and cardiomegaly.  -he reports being off his medications due to cost prohibitive pricing.  -patient states he requires financial assistance.  -he states he stopped taking his diuretics and cardiac medications -he appears improved from cardiac dysfunction perspective  -would diurese first and recruit lungs with IS and metaneb -he needs PT/OT    Tobacco abuse   - patient continues to smoke actively   Acute on chronic systolic CHF with EF <45% - S/p lasix 60mg  and now on torsemide 40mg  -patient shares he is able to urinate adequately without issues -he has AF and is on metoprolol and this makes diuresis more complicated.    Scrotal swelling   US scrotum - concern for inguinal hernia    - general surgery consultation    Thank you for allowing me to participate in the care of this patient.   Patient/Family are satisfied with care plan and all questions have been answered.    Provider disclosure: Patient with at least one acute or chronic illness or injury that poses a threat to life or bodily function and is being managed actively during this encounter.  All of the below services  have been performed independently by signing provider:  review of prior documentation from internal and or external health records.  Review of previous and current lab results.  Interview and comprehensive assessment during patient visit today. Review of current and previous chest radiographs/CT scans. Discussion of management and test interpretation with health care team and patient/family.   This document was prepared using Conservation officer, historic buildings  and may include unintentional dictation errors.     [name], M.D.  Division of Pulmonary & Critical Care Medicine

## 2023-07-07 NOTE — Plan of Care (Signed)

## 2023-07-07 NOTE — Progress Notes (Signed)
Occupational Therapy Treatment Patient Details Name: Kenneth Benson MRN: [id number] DOB: 1961-02-20 Today's Date: 07/07/2023   History of present illness Pt is a 61y/o male presenting due to acute exacerbation of COPD and hypoxic respiratory failure. PMH inlcudes Afib, AKI, CHF, smoking, COPD, MI, chronic asthma, and obesity. Pt began amiodarone drip 8/5.   OT comments  Chart reviewed, pt greeted in bed, agreeable to OT tx session targeting improving functional activity tolerance through ADL tasks. Improvements noted in bed mobility with MOD I, grooming standing at the sink with supervision with no AD, amb in room with RW with supervision. Continued education re: energy conservation techniques, safe ADL completion. Pt with HR noted to 140 with brief mobility in room, nurse notified. Pt is left in bedside chair, all needs met. OT will continue to follow acutely.       If plan is discharge home, recommend the following:  Assistance with cooking/housework;Assist for transportation;Help with stairs or ramp for entrance   Equipment Recommendations  None recommended by OT    Recommendations for Other Services      Precautions / Restrictions Precautions Precautions: Fall Restrictions Weight Bearing Restrictions: No       Mobility Bed Mobility Overal bed mobility: Needs Assistance Bed Mobility: Supine to Sit     Supine to sit: Modified independent (Device/Increase time)          Transfers Overall transfer level: Needs assistance Equipment used: Rolling walker (2 wheels) Transfers: Sit to/from Stand Sit to Stand: Supervision, Contact guard assist           General transfer comment: CGA without AD, supervision with RW, multiple attempts     Balance Overall balance assessment: Needs assistance Sitting-balance support: No upper extremity supported, Feet supported Sitting balance-Leahy Scale: Normal     Standing balance support: Bilateral upper extremity supported Standing  balance-Leahy Scale: Good                             ADL either performed or assessed with clinical judgement   ADL Overall ADL's : Needs assistance/impaired     Grooming: Wash/dry face;Oral care;Wash/dry hands;Standing;Supervision/safety Grooming Details (indicate cue type and reason): sink level with no AD, supervision to monitor vital signs             Lower Body Dressing: Set up;Sitting/lateral leans Lower Body Dressing Details (indicate cue type and reason): socks on edge of bed Toilet Transfer: Supervision/safety;Rolling walker (2 wheels);Ambulation Toilet Transfer Details (indicate cue type and reason): simulated         Functional mobility during ADLs: Supervision/safety;Rolling walker (2 wheels) (approx 15' in room)        Cognition Arousal: Alert Behavior During Therapy: WFL for tasks assessed/performed Overall Cognitive Status: Within Functional Limits for tasks assessed                                          Exercises Other Exercises Other Exercises: edu re: continued progression of mobility, monitoring vital signs, use of DME in room    Shoulder Instructions       General Comments HR up to 140 briefly with mobility in room, down to 120s with seated rest break; Spo2 to 86% on RA while pt washing face; spo2 >90% on 2 L via [place] throughout mobility    Pertinent Vitals/ Pain  Pain Assessment Pain Assessment: 0-10 Pain Score: 2  Pain Location: generalized joint pain Pain Descriptors / Indicators: Discomfort, Grimacing Pain Intervention(s): Monitored during session   Frequency  Min 1X/week        Progress Toward Goals  OT Goals(current goals can now be found in the care plan section)  Progress towards OT goals: Progressing toward goals      AM-PAC OT "6 Clicks" Daily Activity     Outcome Measure   Help from another person eating meals?: None Help from another person taking care of personal grooming?:  None Help from another person toileting, which includes using toliet, bedpan, or urinal?: A Little Help from another person bathing (including washing, rinsing, drying)?: A Little Help from another person to put on and taking off regular upper body clothing?: None Help from another person to put on and taking off regular lower body clothing?: None 6 Click Score: 22    End of Session Equipment Utilized During Treatment: Oxygen  OT Visit Diagnosis: Unsteadiness on feet (R26.81)   Activity Tolerance Patient tolerated treatment well   Patient Left in chair;with call bell/phone within reach;with chair alarm set   Nurse Communication Mobility status (HR)        Time: 1000-1017 OT Time Calculation (min): 17 min  Charges: OT General Charges $OT Visit: 1 Visit OT Treatments $Self Care/Home Management : 8-22 mins  Oleta Mouse, OTD OTR/L  07/07/23, 11:00 AM

## 2023-07-08 DIAGNOSIS — I4891 Unspecified atrial fibrillation: Secondary | ICD-10-CM

## 2023-07-08 LAB — GLUCOSE, CAPILLARY
Glucose-Capillary: 92 mg/dL (ref 70–99)
Glucose-Capillary: 146 mg/dL — ABNORMAL HIGH (ref 70–99)
Glucose-Capillary: 143 mg/dL — ABNORMAL HIGH (ref 70–99)
Glucose-Capillary: 139 mg/dL — ABNORMAL HIGH (ref 70–99)

## 2023-07-08 LAB — MAGNESIUM: Magnesium: 2.6 mg/dL — ABNORMAL HIGH (ref 1.7–2.4)

## 2023-07-08 MED ORDER — LACTATED RINGERS IV BOLUS
250.0000 mL | Freq: Once | INTRAVENOUS | Status: AC
  Administered 2023-07-08: 250 mL via INTRAVENOUS

## 2023-07-08 MED ORDER — SOTALOL HCL 80 MG PO TABS: 80.0000 mg | ORAL_TABLET | Freq: Two times a day (BID) | ORAL | Status: DC

## 2023-07-08 MED ORDER — AMIODARONE LOAD VIA INFUSION
150.0000 mg | Freq: Once | INTRAVENOUS | Status: DC
Start: 2023-07-08 — End: 2023-07-08

## 2023-07-08 MED ORDER — AMIODARONE HCL IN DEXTROSE 360-4.14 MG/200ML-% IV SOLN
60.0000 mg/h | INTRAVENOUS | Status: DC
  Filled 2023-07-08: qty 200

## 2023-07-08 MED ORDER — AMIODARONE HCL IN DEXTROSE 360-4.14 MG/200ML-% IV SOLN: 30.0000 mg/h | INTRAVENOUS | Status: DC

## 2023-07-08 MED ORDER — AMIODARONE HCL IN DEXTROSE 360-4.14 MG/200ML-% IV SOLN
60.0000 mg/h | INTRAVENOUS | Status: DC
Start: 2023-07-08 — End: 2023-07-08

## 2023-07-08 MED ORDER — AMIODARONE LOAD VIA INFUSION
150.0000 mg | Freq: Once | INTRAVENOUS | Status: DC
  Filled 2023-07-08: qty 83.34

## 2023-07-08 MED ORDER — AMIODARONE HCL IN DEXTROSE 360-4.14 MG/200ML-% IV SOLN
30.0000 mg/h | INTRAVENOUS | Status: DC
Start: 2023-07-09 — End: 2023-07-08

## 2023-07-08 NOTE — Progress Notes (Signed)
Progress Note   Patient: Kenneth Benson OZH:[id number] DOB: 05-26-1961 DOA: 07/02/2023     6 DOS: the patient was seen and examined on 07/08/2023   Brief hospital course:  Kenneth Benson is a 62 y.o. male with medical history significant of CHF with EF of 45-50%, hypertension, COPD/asthma, atrial fibrillation on Eliquis, obesity, tobacco abuse, who presents with shortness of breath.Patient states that he has shortness of breath which has been progressively worsening over past 2 weeks. He has mild bilateral leg edema.  Patient is not using oxygen normally, found to have oxygen saturation 91% on room air, which improved to 99% on 3 L oxygen.  Patient states that he has not been taking his diltiazem for the past few weeks as he has run out.  HPI is limited as patient is tachycardic in A-fib RVR movement and speaking heart rate is elevated.  Patient does continue to smoke.  Denies any chest pain currently. In the emergency room: Patient is alert awake oriented mild distress due to tachycardia.  Vitals show bradycardia initially with respirations of 26 and normal blood pressure which has eventually changed to heart rate of 144 A-fib RVR respirations of 26 blood pressure of 103/88 O2 sats of 95% on 3 L. Venous blood gas-ordered and pending. BMP shows glucose 146 normal kidney function. Hepatic function test normal. BNP of 661/troponin of 14/repeat troponin of 13. Procalcitonin of less than 10. CBC within normal limits 15.8 normal white count normal platelet count. Respiratory panel is negative for COVID and influenza.  Assessment and Plan:  Acute hypoxic respiratory failure secondary to acute COPD exacerbation: currently requiring 2LPM O2 [place]  Continue COPD management 6-minute walk to be assessed for home oxygen need prior to discharge   Paroxysmal A-fib with rapid ventricular rate: likely triggered from COPD.  fluctuating rate and rhythm Reviewed cardiologist note from today, appreciate their  help Continue Metoprolol 50mg  BID Increased cardizem to 60mg  Q6hrs, BP soft, hold if SBP < 100 Continue Eliquis 5mg  BID Cardiac monitoring  Acute COPD exacerbation: Improving Continue levalbuterol Q6hrs Continue prednisone taper regimen Continue oxygen therapy to keep O2 sats> 92% Appreciate pulmonary input- pt clear from pulmonology stand point   Chronic combined systolic and diastolic dysfunction CHF Stable and not acutely exacerbated Last 2D echocardiogram from 01/24 showed an LVEF of 45 to 50% Continue Jardiance, losartan, metoprolol and torsemide   Nicotine dependence Smoking cessation was discussed with patient's Continue nicotine transdermal patch   Obesity (BMI 36) Complicates overall prognosis and care Lifestyle modification and exercise has been discussed with patient in detail      Subjective: Patient is seen and examined at the bedside.  Physical Exam: Vitals:   07/08/23 0504 07/08/23 0737 07/08/23 0803 07/08/23 1150  BP: 101/83  110/85 90/71  Pulse: (!) 103  87 82  Resp: 18   20  Temp: 98.3 F (36.8 C)  98 F (36.7 C)   TempSrc: Oral  Oral   SpO2: 96% 96% 97% 94%  Weight:      Height:       Vitals and nursing note reviewed.  Constitutional:      Appearance: He is well-developed.  HENT:     Head: Normocephalic and atraumatic.  Eyes:     Pupils: Pupils are equal, round, and reactive to light.  Cardiovascular:     Rate and Rhythm: normal rate. Rhythm irregular.  Pulmonary:     Effort: Mild Diffuse wheezing but improved, normal effort Abdominal:  General: Bowel sounds are normal.     Palpations: Abdomen is soft.  Musculoskeletal:        General: Normal range of motion.     Cervical back: Normal range of motion and neck supple.  Skin:    General: Skin is warm and dry.  Neurological:     General: No focal deficit present.     Mental Status: He is alert.  Psychiatric:        Mood and Affect: Mood normal.        Behavior: Behavior normal.       Data Reviewed:  Latest Reference Range & Units 07/08/23 04:57  Sodium 135 - 145 mmol/L 141  Potassium 3.5 - 5.1 mmol/L 3.9  Chloride 98 - 111 mmol/L 93 (L)  CO2 22 - 32 mmol/L 39 (H)  Glucose 70 - 99 mg/dL 94  BUN 6 - 20 mg/dL 34 (H)  Creatinine 5.62 - 1.24 mg/dL 1.30  Calcium 8.9 - 86.5 mg/dL 8.8 (L)  Anion gap 5 - 15  9  GFR, Estimated >60 mL/min >60  (L): Data is abnormally low (H): Data is abnormally high  Family Communication: Plan of care discussed with patient at the bedside.  Disposition: Status is: Inpatient Remains inpatient appropriate because: Treatment for COPD exacerbation, possible discharge on Sunday/Monday  Planned Discharge Destination: Home    Time spent: 33 minutes  Author: Ernestene Mention, MD 07/08/2023 1:33 PM  For on call review [email or web address].

## 2023-07-08 NOTE — Progress Notes (Signed)
Patient ID: Kenneth Benson, male   DOB: 01-29-1961, 62 y.o.   MRN: [id number] [facility]    SUBJECTIVE: Patient states he feels to be well denies any worsening chest pain still has some shortness of breath no leg edema resting comfortably denies any palpitations or tachycardia   Vitals:   07/08/23 0504 07/08/23 0737 07/08/23 0803 07/08/23 1150  BP: 101/83  110/85 90/71  Pulse: (!) 103  87 82  Resp: 18   20  Temp: 98.3 F (36.8 C)  98 F (36.7 C)   TempSrc: Oral  Oral   SpO2: 96% 96% 97% 94%  Weight:      Height:         Intake/Output Summary (Last 24 hours) at 07/08/2023 1151 Last data filed at 07/08/2023 4259 Gross per 24 hour  Intake 960 ml  Output 4700 ml  Net -3740 ml      PHYSICAL EXAM  General: Well developed, well nourished, in no acute distress HEENT:  Normocephalic and atramatic Neck:  No JVD.  Lungs: Clear bilaterally to auscultation and percussion. Heart: HRRR . Normal S1 and S2 without gallops or murmurs.  Abdomen: Bowel sounds are positive, abdomen soft and non-tender  Msk:  Back normal, normal gait. Normal strength and tone for age. Extremities: No clubbing, cyanosis or edema.   Neuro: Alert and oriented X 3. Psych:  Good affect, responds appropriately   LABS: Basic Metabolic Panel: Recent Labs    07/07/23 0619 07/08/23 0457  NA 139 141  K 4.1 3.9  CL 95* 93*  CO2 37* 39*  GLUCOSE 107* 94  BUN 29* 34*  CREATININE 0.99 1.09  CALCIUM 8.5* 8.8*   Liver Function Tests: No results for input(s): "AST", "ALT", "ALKPHOS", "BILITOT", "PROT", "ALBUMIN" in the last 72 hours. No results for input(s): "LIPASE", "AMYLASE" in the last 72 hours. CBC: Recent Labs    07/06/23 0535 07/07/23 0619  WBC 8.2 8.2  HGB 13.9 15.5  HCT 43.7 47.8  MCV 88.5 86.0  PLT 201 188   Cardiac Enzymes: No results for input(s): "CKTOTAL", "CKMB", "CKMBINDEX", "TROPONINI" in the last 72 hours. BNP: Invalid input(s): "POCBNP" D-Dimer: No results for input(s):  "DDIMER" in the last 72 hours. Hemoglobin A1C: No results for input(s): "HGBA1C" in the last 72 hours. Fasting Lipid Panel: No results for input(s): "CHOL", "HDL", "LDLCALC", "TRIG", "CHOLHDL", "LDLDIRECT" in the last 72 hours. Thyroid Function Tests: No results for input(s): "TSH", "T4TOTAL", "T3FREE", "THYROIDAB" in the last 72 hours.  Invalid input(s): "FREET3" Anemia Panel: No results for input(s): "VITAMINB12", "FOLATE", "FERRITIN", "TIBC", "IRON", "RETICCTPCT" in the last 72 hours.  US SCROTUM W/DOPPLER  Result Date: 07/06/2023 CLINICAL DATA:  [id number] Inguinal hernia [id number] Testicle swelling [id number] EXAM: SCROTAL ULTRASOUND DOPPLER ULTRASOUND OF THE TESTICLES TECHNIQUE: Complete ultrasound examination of the testicles, epididymis, and other scrotal structures was performed. Color and spectral Doppler ultrasound were also utilized to evaluate blood flow to the testicles. COMPARISON:  CT angiography abdomen and pelvis from 12/16/2022. FINDINGS: Right testicle Measurements: 2.7 x 3.3 x 5.0 cm. No mass or microlithiasis visualized. Left testicle Measurements: 2.2 x 3.3 x 4.5 cm. No mass or microlithiasis visualized. There is an ill-defined heterogeneous and hypoechoic 4 x 6 x 15 mm area in the subcapsular portion of the lower pole of the testicle. This is of indeterminate etiology but favored to represent sequela of prior infection, inflammation or hematoma. Right epididymis:  Normal in size and appearance. Left epididymis:  Normal in size and appearance. Hydrocele:  None visualized. Varicocele:  None visualized. Pulsed Doppler interrogation of both testes demonstrates normal low resistance arterial and venous waveforms bilaterally. Additional, evaluation of bilateral inguinal region was performed. There is a small-to-moderate fat containing right inguinal hernia and small-to-moderate fat and probable bowel containing left inguinal hernia. Please refer to CT scan abdomen and pelvis from  12/16/2022 for better visualization. IMPRESSION: *No evidence of testicular torsion. Bilateral inguinal hernias. Electronically Signed   By: Jules Schick M.D.   On: 07/06/2023 13:46     Echo mild reduced left ventricular function EF 45 to 50%  TELEMETRY: Atrial fibrillation rate of around 95 nonspecific ST-T wave changes:  ASSESSMENT AND PLAN:  Principal Problem:   Acute hypoxic respiratory failure (HCC) Active Problems:   COPD (chronic obstructive pulmonary disease) (HCC)   Cigarette smoker   Paroxysmal atrial fibrillation with RVR (HCC)   Acute on chronic combined systolic and diastolic CHF (congestive heart failure) (HCC)   HTN (hypertension)    Plan Acute respiratory hypoxic respiratory failure continue inhalers as necessary Continue aggressive pulmonary management and control with inhalers steroid therapy Low-dose diuretic therapy As patient refrain from tobacco abuse as a contributor to his COPD Paroxysmal atrial fibrillation recommend rate control consider antiarrhythmic possibly sotalol will probably avoid amiodarone because of his lung disease  Chronic systolic and diastolic congestive heart failure continue heart failure management and control Recommend weight loss exercise portion control   Alwyn Pea, MD, 07/08/2023 11:51 AM

## 2023-07-08 NOTE — Progress Notes (Incomplete)
       CROSS COVER NOTE  NAME: Kenneth Benson MRN: [id number] DOB : November 02, 1961    Concern as stated by nurse / staff    Hello, this pt was supposed to get started on amio gtt, he had afib with rvr during the day per day RN. I was going to start it now but his BP and HR is currently low. He also has metoprolol due. I am going to hold everything until further instruction, thank you    Pertinent findings on chart review:   Assessment and  Interventions   Assessment:  Plan: X X X

## 2023-07-08 NOTE — Progress Notes (Signed)
Heart rate on tele up to 130-140s. Afib w/RVR on EKG taken at 1510. No response from cardiology, contacted Dr Mariane Masters who placed LR bolus. Patient is tachypneic at baseline (throughout shift), on 1L of O2 nasal cannula. 1800 Cardizem dose given earlier.  1740: Patient's heart rate is now down to 110s on tele.   07/08/23 1600  Assess: MEWS Score  Temp 98 F (36.7 C)  BP 103/79  MAP (mmHg) 88  Pulse Rate 61  ECG Heart Rate (!) 122  Resp (!) 24  SpO2 94 %  Assess: MEWS Score  MEWS Temp 0  MEWS Systolic 0  MEWS Pulse 2  MEWS RR 1  MEWS LOC 0  MEWS Score 3  MEWS Score Color Yellow  Assess: if the MEWS score is Yellow or Red  Were vital signs accurate and taken at a resting state? Yes  Does the patient meet 2 or more of the SIRS criteria? No  MEWS guidelines implemented  Yes, yellow  Treat  MEWS Interventions Considered administering scheduled or prn medications/treatments as ordered  Take Vital Signs  Increase Vital Sign Frequency  Yellow: Q2hr x1, continue Q4hrs until patient remains green for 12hrs  Escalate  MEWS: Escalate Yellow: Discuss with charge nurse and consider notifying provider and/or RRT  Notify: Charge Nurse/RN  Name of Charge Nurse/RN Notified [facility]  Provider Notification  Provider Name/Title Dr Mariane Masters  Date Provider Notified 07/08/23  Time Provider Notified 1609  Method of Notification Page (secure chat)  Notification Reason Change in status  Provider response See new orders  Date of Provider Response 07/08/23  Time of Provider Response 1609  Assess: SIRS CRITERIA  SIRS Temperature  0  SIRS Pulse 1  SIRS Respirations  1  SIRS WBC 0  SIRS Score Sum  2

## 2023-07-08 NOTE — Plan of Care (Signed)
  Problem: Respiratory: Goal: Ability to maintain a clear airway will improve Outcome: Progressing Goal: Levels of oxygenation will improve Outcome: Progressing   Problem: Pain Managment: Goal: General experience of comfort will improve Outcome: Progressing   Problem: Safety: Goal: Ability to remain free from injury will improve Outcome: Progressing   Problem: Cardiac: Goal: Ability to achieve and maintain adequate cardiopulmonary perfusion will improve Outcome: Not Progressing

## 2023-07-09 DIAGNOSIS — I480 Paroxysmal atrial fibrillation: Secondary | ICD-10-CM

## 2023-07-09 DIAGNOSIS — I5043 Acute on chronic combined systolic (congestive) and diastolic (congestive) heart failure: Secondary | ICD-10-CM | POA: Diagnosis not present

## 2023-07-09 DIAGNOSIS — J9601 Acute respiratory failure with hypoxia: Secondary | ICD-10-CM | POA: Diagnosis not present

## 2023-07-09 DIAGNOSIS — J449 Chronic obstructive pulmonary disease, unspecified: Secondary | ICD-10-CM | POA: Diagnosis not present

## 2023-07-09 LAB — GLUCOSE, CAPILLARY
Glucose-Capillary: 124 mg/dL — ABNORMAL HIGH (ref 70–99)
Glucose-Capillary: 165 mg/dL — ABNORMAL HIGH (ref 70–99)
Glucose-Capillary: 212 mg/dL — ABNORMAL HIGH (ref 70–99)
Glucose-Capillary: 116 mg/dL — ABNORMAL HIGH (ref 70–99)

## 2023-07-09 MED ORDER — DILTIAZEM HCL ER COATED BEADS 120 MG PO CP24
240.0000 mg | ORAL_CAPSULE | Freq: Every day | ORAL | Status: DC
  Administered 2023-07-10: 240 mg via ORAL
  Filled 2023-07-09: qty 2

## 2023-07-09 MED ORDER — DILTIAZEM HCL 30 MG PO TABS: 60.0000 mg | ORAL_TABLET | Freq: Four times a day (QID) | ORAL | Status: DC | PRN

## 2023-07-09 NOTE — Progress Notes (Signed)
Physical Therapy Treatment Patient Details Name: Kenneth Benson MRN: [id number] DOB: December 04, 1960 Today's Date: 07/09/2023   History of Present Illness Pt is a 61y/o male presenting due to acute exacerbation of COPD and hypoxic respiratory failure. PMH inlcudes Afib, AKI, CHF, smoking, COPD, MI, chronic asthma, and obesity. Pt began amiodarone drip 8/5.    PT Comments  2 lpm at rest 94%.  He is able to get in and out of bed on his own and don socks.  Stands and walks 100' with RW and 100' with no AD.  Gait is improved with RW but no interventions is needed without AD.  General back pain, weakness and instability is improved with RW and he is agreeable to one at home.  Would benefit from use in community or in outside areas.  Sats 96% on 2 lpm after gait.  Pt did not use O2 at baseline.  May need qualifying sats next session.   If plan is discharge home, recommend the following: A little help with walking and/or transfers;Assistance with cooking/housework;Assist for transportation;Help with stairs or ramp for entrance   Can travel by private vehicle        Equipment Recommendations  Rolling walker (2 wheels)    Recommendations for Other Services       Precautions / Restrictions Precautions Precautions: Fall Restrictions Weight Bearing Restrictions: No     Mobility  Bed Mobility Overal bed mobility: Modified Independent Bed Mobility: Supine to Sit, Sit to Supine     Supine to sit: Modified independent (Device/Increase time) Sit to supine: Modified independent (Device/Increase time)     Patient Response: Cooperative  Transfers Overall transfer level: Modified independent Equipment used: None Transfers: Sit to/from Stand Sit to Stand: Modified independent (Device/Increase time)                Ambulation/Gait Ambulation/Gait assistance: Contact guard assist Gait Distance (Feet): 200 Feet Assistive device: Rolling walker (2 wheels), None Gait Pattern/deviations:  Trunk flexed, Step-through pattern       General Gait Details: 100' with walker, 100' without.  gait improved with RW due to balance and general back discomfort   Stairs             Wheelchair Mobility     Tilt Bed Tilt Bed Patient Response: Cooperative  Modified Rankin (Stroke Patients Only)       Balance Overall balance assessment: Modified Independent, Mild deficits observed, not formally tested                                          Cognition Arousal: Alert Behavior During Therapy: WFL for tasks assessed/performed Overall Cognitive Status: Within Functional Limits for tasks assessed                                          Exercises      General Comments        Pertinent Vitals/Pain Pain Assessment Pain Assessment: No/denies pain    Home Living                          Prior Function            PT Goals (current goals can now be found in the care plan section) Progress towards  PT goals: Progressing toward goals    Frequency    Min 1X/week      PT Plan      Co-evaluation              AM-PAC PT "6 Clicks" Mobility   Outcome Measure  Help needed turning from your back to your side while in a flat bed without using bedrails?: None Help needed moving from lying on your back to sitting on the side of a flat bed without using bedrails?: None Help needed moving to and from a bed to a chair (including a wheelchair)?: None Help needed standing up from a chair using your arms (e.g., wheelchair or bedside chair)?: None Help needed to walk in hospital room?: A Little Help needed climbing 3-5 steps with a railing? : A Little 6 Click Score: 22    End of Session Equipment Utilized During Treatment: Gait belt Activity Tolerance: Patient tolerated treatment well;Other (comment) Patient left: in bed;with call bell/phone within reach;with bed alarm set Nurse Communication: Mobility status PT  Visit Diagnosis: Other abnormalities of gait and mobility (R26.89);Difficulty in walking, not elsewhere classified (R26.2)     Time: 1610-9604 PT Time Calculation (min) (ACUTE ONLY): 17 min  Charges:    $Gait Training: 8-22 mins PT General Charges $$ ACUTE PT VISIT: 1 Visit                   Danielle Dess, PTA 07/09/23, 9:30 AM

## 2023-07-09 NOTE — Progress Notes (Signed)
[facility]  - [place] [facility]   PATIENT NAME: Kenneth Benson    MR#:  [id number]  DATE OF BIRTH:  [birth year]  SUBJECTIVE:   No family at bedside. Patient overall feels better. Good urine output. Trying to wean him off oxygen. Worked with physical therapy.   VITALS:  Blood pressure 115/60, pulse (!) 40, temperature (!) 97.2 F (36.2 C), resp. rate 18, height 6' (1.829 m), weight 122.6 kg, SpO2 95%.  PHYSICAL EXAMINATION:   GENERAL:  62 y.o.-year-old patient with no acute distress. Morbidly obese LUNGS: decreased breath sounds bilaterally, no wheezing CARDIOVASCULAR: S1, S2 normal. No murmur   ABDOMEN: Soft, nontender, nondistended. Bowel sounds present.  EXTREMITIES: trace edema b/l.    NEUROLOGIC: nonfocal  patient is alert and awake SKIN: No obvious rash, lesion, or ulcer.   LABORATORY PANEL:  CBC Recent Labs  Lab 07/07/23 0619  WBC 8.2  HGB 15.5  HCT 47.8  PLT 188    Chemistries  Recent Labs  Lab 07/02/23 1821 07/03/23 0936 07/08/23 0456 07/08/23 0457  NA  --    < >  --  141  K  --    < >  --  3.9  CL  --    < >  --  93*  CO2  --    < >  --  39*  GLUCOSE  --    < >  --  94  BUN  --    < >  --  34*  CREATININE  --    < >  --  1.09  CALCIUM  --    < >  --  8.8*  MG  --    < > 2.6*  --   AST 16  --   --   --   ALT 17  --   --   --   ALKPHOS 40  --   --   --   BILITOT 0.9  --   --   --    < > = values in this interval not displayed.   Assessment and Plan  JARULE SAMPATH is a 62 y.o. male with medical history significant of CHF with EF of 45-50%, hypertension, COPD/asthma, atrial fibrillation on Eliquis, obesity, tobacco abuse, who presents with shortness of breath.Patient states that he has shortness of breath which has been progressively worsening over past 2 weeks. He has mild bilateral leg edema   Acute hypoxic respiratory failure secondary to acute COPD exacerbation:  --currently requiring 2LPM O2 [place]  --Continue COPD  management -- assess for home oxygen -- continue steroid taper -- smoking cessation advised -- follow-up pulmonary as outpatient   Paroxysmal A-fib with rapid ventricular rate: likely triggered from COPD.  --fluctuating rate and rhythm --Reviewed cardiologist note from today, appreciate their help --Continue Metoprolol 50mg  BID --Increased cardizem to 60mg  Q6hrs, BP soft, hold if SBP < 100 --Continue Eliquis 5mg  BID    Chronic combined systolic and diastolic dysfunction CHF --Stable and not acutely exacerbated --Last 2D echocardiogram from 01/24 showed an LVEF of 45 to 50% --Continue Jardiance, losartan, metoprolol and torsemide   Nicotine dependence --Smoking cessation was discussed with patient's --Continue nicotine transdermal patch   Obesity (BMI 36) --Complicates overall prognosis and care --Lifestyle modification and exercise has been discussed with patient in detail  Procedures: none Family communication : none Consults :cardiology, pulmonary CODE STATUS: full DVT Prophylaxis : eliquis Level of care: Progressive Status is: Inpatient Remains inpatient appropriate because:  assess for home oxygen. Will await for prescription to be delivered from [facility] community pharmacy tomorrow prior to discharge.    TOTAL TIME TAKING CARE OF THIS PATIENT: 35 minutes.  >50% time spent on counselling and coordination of care  Note: This dictation was prepared with Dragon dictation along with smaller phrase technology. Any transcriptional errors that result from this process are unintentional.  Enedina Finner M.D    [facility]   CC: Primary care physician; Pcp, No

## 2023-07-09 NOTE — Plan of Care (Signed)
  Problem: Education: Goal: Ability to demonstrate management of disease process will improve Outcome: Progressing Goal: Ability to verbalize understanding of medication therapies will improve Outcome: Progressing   

## 2023-07-09 NOTE — Progress Notes (Signed)
Patient ID: Kenneth Benson, male   DOB: 1961/05/30, 62 y.o.   MRN: [id number] [facility]    SUBJECTIVE: Resting comfortably shortness of breath improved still limited in activity has not ambulated much denies any significant palpitations or tachycardia denies any chest pain   Vitals:   07/09/23 0411 07/09/23 0412 07/09/23 0809 07/09/23 0823  BP: 94/74   100/75  Pulse: 95   62  Resp: 16   18  Temp: 98.1 F (36.7 C)   (!) 97.2 F (36.2 C)  TempSrc: Oral     SpO2: 96%  94% 95%  Weight:  122.6 kg    Height:         Intake/Output Summary (Last 24 hours) at 07/09/2023 1141 Last data filed at 07/09/2023 1000 Gross per 24 hour  Intake 250 ml  Output 4620 ml  Net -4370 ml      PHYSICAL EXAM  General: Well developed, well nourished, in no acute distress HEENT:  Normocephalic and atramatic Neck:  No JVD.  Lungs: Clear bilaterally to auscultation and percussion. Heart: HRRR . Normal S1 and S2 without gallops or murmurs.  Abdomen: Bowel sounds are positive, abdomen soft and non-tender  Msk:  Back normal, normal gait. Normal strength and tone for age. Extremities: No clubbing, cyanosis or edema.   Neuro: Alert and oriented X 3. Psych:  Good affect, responds appropriately   LABS: Basic Metabolic Panel: Recent Labs    07/07/23 0619 07/08/23 0456 07/08/23 0457  NA 139  --  141  K 4.1  --  3.9  CL 95*  --  93*  CO2 37*  --  39*  GLUCOSE 107*  --  94  BUN 29*  --  34*  CREATININE 0.99  --  1.09  CALCIUM 8.5*  --  8.8*  MG  --  2.6*  --    Liver Function Tests: No results for input(s): "AST", "ALT", "ALKPHOS", "BILITOT", "PROT", "ALBUMIN" in the last 72 hours. No results for input(s): "LIPASE", "AMYLASE" in the last 72 hours. CBC: Recent Labs    07/07/23 0619  WBC 8.2  HGB 15.5  HCT 47.8  MCV 86.0  PLT 188   Cardiac Enzymes: No results for input(s): "CKTOTAL", "CKMB", "CKMBINDEX", "TROPONINI" in the last 72 hours. BNP: Invalid input(s):  "POCBNP" D-Dimer: No results for input(s): "DDIMER" in the last 72 hours. Hemoglobin A1C: No results for input(s): "HGBA1C" in the last 72 hours. Fasting Lipid Panel: No results for input(s): "CHOL", "HDL", "LDLCALC", "TRIG", "CHOLHDL", "LDLDIRECT" in the last 72 hours. Thyroid Function Tests: No results for input(s): "TSH", "T4TOTAL", "T3FREE", "THYROIDAB" in the last 72 hours.  Invalid input(s): "FREET3" Anemia Panel: No results for input(s): "VITAMINB12", "FOLATE", "FERRITIN", "TIBC", "IRON", "RETICCTPCT" in the last 72 hours.  No results found.   Echo reduced left ventricular function EF around 45 to 50%  TELEMETRY: Atrial fibrillation  110 nonspecific ST-T wave changes  ASSESSMENT AND PLAN:  Principal Problem:   Acute hypoxic respiratory failure (HCC) Active Problems:   COPD (chronic obstructive pulmonary disease) (HCC)   Cigarette smoker   Paroxysmal atrial fibrillation with RVR (HCC)   Acute on chronic combined systolic and diastolic CHF (congestive heart failure) (HCC)   HTN (hypertension)    Plan Atrial fibrillation RVR recommend amiodarone for rate and rhythm continue aggressive therapy Acute hypoxic respiratory failure secondary to COPD continue inhalers steroid therapy supplemental oxygen and inhalers 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

## 2023-07-09 NOTE — TOC Progression Note (Signed)
Transition of Care [facility]) - Progression Note    Patient Details  Name: Kenneth Benson MRN: [id number] Date of Birth: Sep 01, 1961  Transition of Care [facility]) CM/SW Contact  Liliana Cline, LCSW Phone Number: 07/09/2023, 4:04 PM  Clinical Narrative:    Met with patient at bedside.  Patient states he does not feel he needs HH or OPPT. He plans to stay active at home. He agreed to let staff know if he changes his mind prior to DC.  Patient states he does not know if he has a PCP. Explained he should have one assigned through Medicaid. Will need to call DSS during normal business hours. Patient is agreeable to home o2 being arranged. He would like it to be arranged through [facility]. He states he has a bipap through there. He would also like to see if he can get a nebulizer through [facility]. Left secure VM for Morrie Sheldon with [facility] with referral and notified of plan to possibly DC home tomorrow.      Barriers to Discharge: Continued Medical Work up  Expected Discharge Plan and Services     Post Acute Care Choice: Home Health, Skilled Nursing Facility Living arrangements for the past 2 months: Single Family Home                                       Social Determinants of Health (SDOH) Interventions SDOH Screenings   Food Insecurity: No Food Insecurity (07/03/2023)  Housing: Low Risk  (07/03/2023)  Transportation Needs: No Transportation Needs (07/03/2023)  Utilities: Not At Risk (07/03/2023)  Tobacco Use: High Risk (07/03/2023)    Readmission Risk Interventions     No data to display

## 2023-07-09 NOTE — TOC Progression Note (Signed)
Transition of Care [facility]) - Progression Note    Patient Details  Name: Kenneth Benson MRN: [id number] Date of Birth: Oct 01, 1961  Transition of Care [facility]) CM/SW Contact  Liliana Cline, LCSW Phone Number: 07/09/2023, 2:26 PM  Clinical Narrative:    Attempted to meet with patient at bedside to see if he wants HH (or OPPT if coverage cannot be found for [facility]). Patient sleeping.      Barriers to Discharge: Continued Medical Work up  Expected Discharge Plan and Services     Post Acute Care Choice: Home Health, Skilled Nursing Facility Living arrangements for the past 2 months: Single Family Home                                       Social Determinants of Health (SDOH) Interventions SDOH Screenings   Food Insecurity: No Food Insecurity (07/03/2023)  Housing: Low Risk  (07/03/2023)  Transportation Needs: No Transportation Needs (07/03/2023)  Utilities: Not At Risk (07/03/2023)  Tobacco Use: High Risk (07/03/2023)    Readmission Risk Interventions     No data to display

## 2023-07-09 NOTE — Plan of Care (Signed)
  Problem: Clinical Measurements: Goal: Respiratory complications will improve Outcome: Progressing   Problem: Clinical Measurements: Goal: Cardiovascular complication will be avoided Outcome: Progressing   Problem: Activity: Goal: Risk for activity intolerance will decrease Outcome: Progressing   Problem: Fluid Volume: Goal: Ability to maintain a balanced intake and output will improve Outcome: Progressing   Problem: Pain Managment: Goal: General experience of comfort will improve Outcome: Progressing   Problem: Safety: Goal: Ability to remain free from injury will improve Outcome: Progressing   Problem: Coping: Goal: Level of anxiety will decrease Outcome: Progressing

## 2023-07-09 NOTE — Progress Notes (Signed)
SATURATION QUALIFICATIONS: (This note is used to comply with regulatory documentation for home oxygen)  Patient Saturations on Room Air at Rest = 94%  Patient Saturations on Room Air while Ambulating = 84%  Patient Saturations on 2 Liters of oxygen while Ambulating = 93%  Please briefly explain why patient needs home oxygen: Dr. [name] was made aware of the above saturations.

## 2023-07-10 ENCOUNTER — Other Ambulatory Visit: Payer: Self-pay

## 2023-07-10 DIAGNOSIS — J9601 Acute respiratory failure with hypoxia: Secondary | ICD-10-CM | POA: Diagnosis not present

## 2023-07-10 LAB — GLUCOSE, CAPILLARY: Glucose-Capillary: 133 mg/dL — ABNORMAL HIGH (ref 70–99)

## 2023-07-10 MED ORDER — SPIRONOLACTONE 25 MG PO TABS
12.5000 mg | ORAL_TABLET | Freq: Every day | ORAL | 3 refills | Status: DC
  Filled 2023-07-10: qty 45, 90d supply, fill #0

## 2023-07-10 MED ORDER — METOPROLOL TARTRATE 50 MG PO TABS
50.0000 mg | ORAL_TABLET | Freq: Two times a day (BID) | ORAL | 3 refills | Status: DC
  Filled 2023-07-10: qty 180, 90d supply, fill #0

## 2023-07-10 MED ORDER — DILTIAZEM HCL ER COATED BEADS 240 MG PO CP24
240.0000 mg | ORAL_CAPSULE | Freq: Every day | ORAL | 3 refills | Status: DC
  Filled 2023-07-10: qty 90, 90d supply, fill #0

## 2023-07-10 MED ORDER — JARDIANCE 10 MG PO TABS
10.0000 mg | ORAL_TABLET | Freq: Every day | ORAL | 3 refills | Status: DC
  Filled 2023-07-10: qty 90, 90d supply, fill #0

## 2023-07-10 MED ORDER — ALBUTEROL SULFATE HFA 108 (90 BASE) MCG/ACT IN AERS
2.0000 | INHALATION_SPRAY | Freq: Four times a day (QID) | RESPIRATORY_TRACT | 1 refills | Status: DC | PRN
  Filled 2023-07-10: qty 18, 30d supply, fill #0

## 2023-07-10 MED ORDER — TORSEMIDE 20 MG PO TABS
40.0000 mg | ORAL_TABLET | Freq: Every day | ORAL | 1 refills | Status: DC
  Filled 2023-07-10: qty 120, 60d supply, fill #0

## 2023-07-10 MED ORDER — PREDNISONE 5 MG PO TABS
ORAL_TABLET | ORAL | 0 refills | Status: AC
  Filled 2023-07-10: qty 22, 6d supply, fill #0

## 2023-07-10 MED ORDER — ALBUTEROL SULFATE (2.5 MG/3ML) 0.083% IN NEBU
2.5000 mg | INHALATION_SOLUTION | Freq: Four times a day (QID) | RESPIRATORY_TRACT | 1 refills | Status: DC | PRN
  Filled 2023-07-10: qty 225, 19d supply, fill #0

## 2023-07-10 MED ORDER — APIXABAN 5 MG PO TABS
5.0000 mg | ORAL_TABLET | Freq: Two times a day (BID) | ORAL | 3 refills | Status: DC
  Filled 2023-07-10: qty 180, 90d supply, fill #0

## 2023-07-10 MED ORDER — ARFORMOTEROL TARTRATE 15 MCG/2ML IN NEBU
15.0000 ug | INHALATION_SOLUTION | Freq: Two times a day (BID) | RESPIRATORY_TRACT | 2 refills | Status: DC
Start: 2023-07-10 — End: 2024-01-15
  Filled 2023-07-10: qty 120, 30d supply, fill #0

## 2023-07-10 NOTE — Progress Notes (Signed)
PULMONOLOGY         Date: 07/10/2023,   MRN# [id number] Kenneth Benson 26-Mar-1961     AdmissionWeight: 122.5 kg                 CurrentWeight: 122.6 kg  Referring provider: Dr Joylene Igo   CHIEF COMPLAINT:   Severe acute exacerbation of COPD   HISTORY OF PRESENT ILLNESS   62 year old male with history of A-fib, AKI, dysrhythmia, chronic asthma, CHF, lifelong smoking history with COPD and chronic hypoxemia, previous MRI who came in with chief complaint of dyspnea and progressively worsening shortness of breath.  Patient admits to noncompliance with supplemental oxygen at home.  Was found to be in A-fib RVR on presentation.  Required 3 L of oxygen to reach normoxia.  Initial blood work showed elevation of BNP suggestive of cardiac stress.  He was tested for COVID-19 and influenza on admission which were both negative.  His most recent echo shows reduced ejection fraction of 45% PCCM consultation for further evaluation management.   07/05/23-patient is now on 2L/min [place] reports feeling better.  Had bath today shares he's refreshed. Overall partial improvement in past 24hr. He is 2.6L net negative. LE edema is now trace. He is being seen by cardiology. Steroids reduced from solumedrol to Prednisone with daily tapering by 5mg .    07/06/23- patient is up in chair s/p PT today.  He is on 2L/min reports feeling better.  He still has scrotal edema but LE edema has resolved. He is on tapering dose of prednisone.   07/07/23- patient getting close to baseline.  He is cleared for dc home from pulmonary perspective.  He may follow up with [place] at [facility] pulmonary post dc home.  I would recommend to come off prednisone by weaning it by 5mg  daily and stay on zithromax 250mg  daily until then   07/10/23 -patient clinically stable and is being optimized for dc home today. Plan for outpatient follow up post dc. Reviewed medical plan with patient and RN  PAST MEDICAL HISTORY   Past Medical History:  Diagnosis  Date   A-fib (HCC) 02/06/2022   AKI (acute kidney injury) (HCC) 12/17/2022   Arrhythmia    atrial fibrillation   Asthma    CHF (congestive heart failure) (HCC)    COPD (chronic obstructive pulmonary disease) (HCC)    Hyperkalemia 12/17/2022   Hypokalemia 04/13/2023   Myocardial injury 04/13/2023   Tobacco abuse      SURGICAL HISTORY   History reviewed. No pertinent surgical history.   FAMILY HISTORY   Family History  Problem Relation Age of Onset   Heart disease Mother    Atrial fibrillation Father      SOCIAL HISTORY   Social History   Tobacco Use   Smoking status: Every Day    Current packs/day: 1.50    Average packs/day: 1.5 packs/day for 27.0 years (40.5 ttl pk-yrs)    Types: Cigarettes   Tobacco comments:    0.5PPD at the most.  trying to quit.  05/11/2023 hfb  Vaping Use   Vaping status: Never Used  Substance Use Topics   Alcohol use: No   Drug use: No     MEDICATIONS    Home Medication:  Current Outpatient Rx   Order #: [id number] Class: Normal   Order #: [id number] Class: Normal   Order #: [id number] Class: Normal   Order #: [id number] Class: Normal   Order #: [id number] Class: Normal   Order #: [id number] Class: Normal   Order #:  [id number] Class: Normal   Order #: [id number] Class: Normal   Order #: [id number] Class: Normal   Order #: [id number] Class: Normal    Current Medication:  Current Facility-Administered Medications:    acetaminophen (TYLENOL) tablet 650 mg, 650 mg, Oral, Q6H PRN, 650 mg at 07/04/23 0833 **OR** acetaminophen (TYLENOL) suppository 650 mg, 650 mg, Rectal, Q6H PRN, Gertha Calkin, MD   albuterol (PROVENTIL) (2.5 MG/3ML) 0.083% nebulizer solution 2.5 mg, 2.5 mg, Nebulization, Q2H PRN, Gertha Calkin, MD   apixaban (ELIQUIS) tablet 5 mg, 5 mg, Oral, BID, Irena Cords V, MD, 5 mg at 07/10/23 2440   bisacodyl (DULCOLAX) EC tablet 5 mg, 5 mg, Oral, Daily PRN, Gertha Calkin, MD   diltiazem (CARDIZEM CD) 24 hr capsule 240 mg, 240 mg, Oral,  Daily, Enedina Finner, MD, 240 mg at 07/10/23 0907   diltiazem (CARDIZEM) tablet 60 mg, 60 mg, Oral, Q6H PRN, Callwood, Dwayne D, MD   docusate sodium (COLACE) capsule 100 mg, 100 mg, Oral, BID, Irena Cords V, MD, 100 mg at 07/10/23 1027   empagliflozin (JARDIANCE) tablet 10 mg, 10 mg, Oral, Daily, Agbata, Tochukwu, MD, 10 mg at 07/10/23 0906   guaiFENesin (MUCINEX) 12 hr tablet 600 mg, 600 mg, Oral, BID PRN, Gertha Calkin, MD   hydrALAZINE (APRESOLINE) injection 5 mg, 5 mg, Intravenous, Q4H PRN, Gertha Calkin, MD   HYDROcodone-acetaminophen (NORCO/VICODIN) 5-325 MG per tablet 1-2 tablet, 1-2 tablet, Oral, Q4H PRN, Gertha Calkin, MD, 2 tablet at 07/09/23 2144   insulin aspart (novoLOG) injection 0-15 Units, 0-15 Units, Subcutaneous, TID WC, Agbata, Tochukwu, MD, 2 Units at 07/10/23 1250   ipratropium (ATROVENT) nebulizer solution 0.5 mg, 0.5 mg, Nebulization, Q6H, Agbata, Tochukwu, MD, 0.5 mg at 07/10/23 0750   levalbuterol (XOPENEX) nebulizer solution 0.63 mg, 0.63 mg, Nebulization, Q6H, Agbata, Tochukwu, MD, 0.63 mg at 07/10/23 0750   metoprolol succinate (TOPROL-XL) 24 hr tablet 50 mg, 50 mg, Oral, BID, Tang, Lily Michelle, PA-C, 50 mg at 07/10/23 2536   nicotine (NICODERM CQ - dosed in mg/24 hours) patch 14 mg, 14 mg, Transdermal, Daily, Irena Cords V, MD, 14 mg at 07/02/23 2001   ondansetron (ZOFRAN) tablet 4 mg, 4 mg, Oral, Q6H PRN **OR** ondansetron (ZOFRAN) injection 4 mg, 4 mg, Intravenous, Q6H PRN, Gertha Calkin, MD   Oral care mouth rinse, 15 mL, Mouth Rinse, PRN, Agbata, Tochukwu, MD   polyethylene glycol (MIRALAX / GLYCOLAX) packet 17 g, 17 g, Oral, Daily PRN, Gertha Calkin, MD   [COMPLETED] predniSONE (DELTASONE) tablet 50 mg, 50 mg, Oral, Q breakfast, 50 mg at 07/06/23 0844 **FOLLOWED BY** [COMPLETED] predniSONE (DELTASONE) tablet 45 mg, 45 mg, Oral, Q breakfast, 45 mg at 07/07/23 0855 **FOLLOWED BY** [COMPLETED] predniSONE (DELTASONE) tablet 40 mg, 40 mg, Oral, Q breakfast, 40 mg at  07/08/23 0817 **FOLLOWED BY** [COMPLETED] predniSONE (DELTASONE) tablet 35 mg, 35 mg, Oral, Q breakfast, 35 mg at 07/09/23 0900 **FOLLOWED BY** [COMPLETED] predniSONE (DELTASONE) tablet 30 mg, 30 mg, Oral, Q breakfast, 30 mg at 07/10/23 0906 **FOLLOWED BY** [START ON 07/11/2023] predniSONE (DELTASONE) tablet 25 mg, 25 mg, Oral, Q breakfast **FOLLOWED BY** [START ON 07/12/2023] predniSONE (DELTASONE) tablet 20 mg, 20 mg, Oral, Q breakfast **FOLLOWED BY** [START ON 07/13/2023] predniSONE (DELTASONE) tablet 15 mg, 15 mg, Oral, Q breakfast **FOLLOWED BY** [START ON 07/14/2023] predniSONE (DELTASONE) tablet 10 mg, 10 mg, Oral, Q breakfast **FOLLOWED BY** [START ON 07/15/2023] predniSONE (DELTASONE) tablet 5 mg, 5 mg, Oral, Q breakfast, Karna Christmas, Eljay Lave, MD   sodium chloride flush (  NS) 0.9 % injection 3 mL, 3 mL, Intravenous, Q12H, Irena Cords V, MD, 3 mL at 07/09/23 2145   spironolactone (ALDACTONE) tablet 12.5 mg, 12.5 mg, Oral, Daily, Tang, Lily Michelle, PA-C, 12.5 mg at 07/10/23 0906   torsemide (DEMADEX) tablet 40 mg, 40 mg, Oral, Daily, Agbata, Tochukwu, MD, 40 mg at 07/10/23 0907    ALLERGIES   Patient has no known allergies.     REVIEW OF SYSTEMS    Review of Systems:  Gen:  Denies  fever, sweats, chills weigh loss  HEENT: Denies blurred vision, double vision, ear pain, eye pain, hearing loss, nose bleeds, sore throat Cardiac:  No dizziness, chest pain or heaviness, chest tightness,edema Resp:   reports dyspnea chronically  Gi: Denies swallowing difficulty, stomach pain, nausea or vomiting, diarrhea, constipation, bowel incontinence Gu:  Denies bladder incontinence, burning urine Ext:   Denies Joint pain, stiffness or swelling Skin: Denies  skin rash, easy bruising or bleeding or hives Endoc:  Denies polyuria, polydipsia , polyphagia or weight change Psych:   Denies depression, insomnia or hallucinations   Other:  All other systems negative   VS: BP 106/85 (BP Location: Right Arm)    Pulse (!) 47   Temp 97.7 F (36.5 C)   Resp 18   Ht 6' (1.829 m)   Wt 122.6 kg   SpO2 96%   BMI 36.66 kg/m      PHYSICAL EXAM    GENERAL:NAD, no fevers, chills, no weakness no fatigue HEAD: Normocephalic, atraumatic.  EYES: Pupils equal, round, reactive to light. Extraocular muscles intact. No scleral icterus.  MOUTH: Moist mucosal membrane. Dentition intact. No abscess noted.  EAR, NOSE, THROAT: Clear without exudates. No external lesions.  NECK: Supple. No thyromegaly. No nodules. No JVD.  PULMONARY: decreased breath sounds with mild rhonchi worse at bases bilaterally.  CARDIOVASCULAR: S1 and S2. Regular rate and rhythm. No murmurs, rubs, or gallops. No edema. Pedal pulses 2+ bilaterally.  GASTROINTESTINAL: Soft, nontender, nondistended. No masses. Positive bowel sounds. No hepatosplenomegaly.  MUSCULOSKELETAL: No swelling, clubbing, or edema. Range of motion full in all extremities.  NEUROLOGIC: Cranial nerves II through XII are intact. No gross focal neurological deficits. Sensation intact. Reflexes intact.  SKIN: No ulceration, lesions, rashes, or cyanosis. Skin warm and dry. Turgor intact.  PSYCHIATRIC: Mood, affect within normal limits. The patient is awake, alert and oriented x 3. Insight, judgment intact.       IMAGING      ASSESSMENT/PLAN   Acute on chronic hypoxemic respiratory failure -there is interstitial edema, atelectasis bilaterally and cardiomegaly.  -he reports being off his medications due to cost prohibitive pricing.  -patient states he requires financial assistance.  -he states he stopped taking his diuretics and cardiac medications -he appears improved from cardiac dysfunction perspective  -would diurese first and recruit lungs with IS and metaneb -he needs PT/OT    Tobacco abuse   - patient continues to smoke actively   Acute on chronic systolic CHF with EF <45% - S/p lasix 60mg  and now on torsemide 40mg  -patient shares he is able to  urinate adequately without issues -he has AF and is on metoprolol and this makes diuresis more complicated.    Scrotal swelling   US scrotum - concern for inguinal hernia    - general surgery consultation    Thank you for allowing me to participate in the care of this patient.   Patient/Family are satisfied with care plan and all questions have been answered.  Provider disclosure: Patient with at least one acute or chronic illness or injury that poses a threat to life or bodily function and is being managed actively during this encounter.  All of the below services have been performed independently by signing provider:  review of prior documentation from internal and or external health records.  Review of previous and current lab results.  Interview and comprehensive assessment during patient visit today. Review of current and previous chest radiographs/CT scans. Discussion of management and test interpretation with health care team and patient/family.   This document was prepared using Dragon voice recognition software and may include unintentional dictation errors.     [name], M.D.  Division of Pulmonary & Critical Care Medicine

## 2023-07-10 NOTE — Discharge Summary (Signed)
Physician Discharge Summary   Patient: Kenneth Benson MRN: [id number] DOB: [birth year]  Admit date:     07/02/2023  Discharge date: 07/10/23  Discharge Physician: Enedina Finner   PCP: Pcp, No   Recommendations at discharge:    F/u dr Nolen Mu at [facility] cardiology in 1-2 weeks [facility] clinic on august 21st at 1 pm F/u Dr Karna Christmas on 1-2 weeks Use your oxygen and nebs as advised Find PCP thru the [facility] office   Discharge Diagnoses: Principal Problem:   Acute hypoxic respiratory failure (HCC) Active Problems:   Acute on chronic combined systolic and diastolic CHF (congestive heart failure) (HCC)   Paroxysmal atrial fibrillation with RVR (HCC)   COPD (chronic obstructive pulmonary disease) (HCC)   HTN (hypertension)   Cigarette smoker   Kenneth Benson is a 62 y.o. male with medical history significant of CHF with EF of 45-50%, hypertension, COPD/asthma, atrial fibrillation on Eliquis, obesity, tobacco abuse, who presents with shortness of breath.Patient states that he has shortness of breath which has been progressively worsening over past 2 weeks. He has mild bilateral leg edema    Acute hypoxic respiratory failure secondary to acute COPD exacerbation:  --currently requiring 2LPM O2 [place]  --Continue COPD management -- assess for home oxygen--pt did qualif for oxygen -- continue steroid taper -- smoking cessation advised -- follow-up pulmonary as outpatient   Paroxysmal A-fib with rapid ventricular rate: likely triggered from COPD.  --fluctuating rate and rhythm --Reviewed cardiologist note from today, appreciate their help --Continue Metoprolol 50mg  BID --Increased cardizem to 60mg  Q6hrs--change to Cardizem CD --Continue Eliquis 5mg  BID    Chronic combined systolic and diastolic dysfunction CHF --Stable and not acutely exacerbated --Last 2D echocardiogram from 01/24 showed an LVEF of 45 to 50% --Continue Jardiance, losartan, metoprolol and torsemide --pt will f/u [facility]  cardiology and [facility] CHF clinic   Nicotine dependence --Smoking cessation was discussed with patient's --Continue nicotine transdermal patch   Obesity (BMI 36) --Complicates overall prognosis and care --Lifestyle modification and exercise has been discussed with patient in detail.  Overall at baseline and stable. will    Procedures: none Family communication : none Consults : cardiology, pulmonary CODE STATUS: full DVT Prophylaxis : eliquis Level of care: Progressive Status is: Inpatient      Diet recommendation:  Discharge Diet Orders (From admission, onward)     Start     Ordered   07/10/23 0000  Diet - low sodium heart healthy        07/10/23 0843            DISCHARGE MEDICATION: Allergies as of 07/10/2023   No Known Allergies      Medication List     STOP taking these medications    budesonide 0.5 MG/2ML nebulizer solution Commonly known as: PULMICORT   losartan 25 MG tablet Commonly known as: COZAAR       TAKE these medications    albuterol (2.5 MG/3ML) 0.083% nebulizer solution Commonly known as: PROVENTIL Take 3 mLs (2.5 mg total) by nebulization every 6 (six) hours as needed for wheezing or shortness of breath.   albuterol 108 (90 Base) MCG/ACT inhaler Commonly known as: VENTOLIN HFA Inhale 2 puffs into the lungs every 6 (six) hours as needed for wheezing or shortness of breath.   apixaban 5 MG Tabs tablet Commonly known as: ELIQUIS Take 1 tablet (5 mg total) by mouth 2 (two) times daily.   arformoterol 15 MCG/2ML Nebu Commonly known as: Brovana Take 2 mLs (15  mcg total) by nebulization 2 (two) times daily.   diltiazem 240 MG 24 hr capsule Commonly known as: CARDIZEM CD Take 1 capsule (240 mg total) by mouth daily. What changed:  medication strength how much to take   Jardiance 10 MG Tabs tablet Generic drug: empagliflozin Take 1 tablet (10 mg total) by mouth daily.   metoprolol tartrate 50 MG tablet Commonly known as:  LOPRESSOR Take 1 tablet (50 mg total) by mouth 2 (two) times daily.   predniSONE 5 MG tablet Commonly known as: DELTASONE Take 6 tablets (30 mg total) by mouth daily with breakfast for 1 day, THEN 5 tablets (25 mg total) daily with breakfast for 1 day, THEN 4 tablets (20 mg total) daily with breakfast for 1 day, THEN 3 tablets (15 mg total) daily with breakfast for 1 day, THEN 2 tablets (10 mg total) daily with breakfast for 1 day, THEN 1 tablet (5 mg total) daily with breakfast for 1 day. Start taking on: July 10, 2023   spironolactone 25 MG tablet Commonly known as: ALDACTONE Take 0.5 tablets (12.5 mg total) by mouth daily.   torsemide 20 MG tablet Commonly known as: DEMADEX Take 2 tablets (40 mg total) by mouth daily.               Durable Medical Equipment  (From admission, onward)           Start     Ordered   07/10/23 0828  For home use only DME Nebulizer machine  Once       Question Answer Comment  Patient needs a nebulizer to treat with the following condition COPD (chronic obstructive pulmonary disease) (HCC)   Length of Need Lifetime      07/10/23 0827   07/10/23 0827  For home use only DME oxygen  Once       Question Answer Comment  Length of Need Lifetime   Mode or (Route) Nasal cannula   Liters per Minute 2   Frequency Continuous (stationary and portable oxygen unit needed)   Oxygen conserving device Yes   Oxygen delivery system Gas      07/10/23 0827            Follow-up Information     Custovic, Rozell Searing, DO. Go in 1 week(s).   Specialty: Cardiology Contact information: [address] [place] [address] [phone]         System, [facility] Follow up.   Specialty: General Surgery Why: consultaiton for bilateral large inguinal hernias, not amenable to repair at [facility] information: [address] [place] [address] [phone]         [name],  MD. Schedule an appointment as soon as possible for a visit.   Specialty: Pulmonary Disease Why: COPD f/u Contact information: [address] [place] [address] [phone]                Discharge Exam: Ceasar Mons Weights   07/05/23 0805 07/08/23 0500 07/09/23 0412  Weight: 128.4 kg 125.9 kg 122.6 kg   GENERAL:  62 y.o.-year-old patient with no acute distress. Morbidly obese LUNGS: decreased breath sounds bilaterally, no wheezing CARDIOVASCULAR: S1, S2 normal. No murmur   ABDOMEN: Soft, nontender, nondistended. Bowel sounds present.  EXTREMITIES: trace edema b/l.    NEUROLOGIC: nonfocal  patient is alert and awake SKIN: No obvious rash, lesion, or ulcer.   Condition at discharge: fair  The results of significant  diagnostics from this hospitalization (including imaging, microbiology, ancillary and laboratory) are listed below for reference.   Imaging Studies: US SCROTUM W/DOPPLER  Result Date: 07/06/2023 CLINICAL DATA:  [id number] Inguinal hernia [id number] Testicle swelling [id number] EXAM: SCROTAL ULTRASOUND DOPPLER ULTRASOUND OF THE TESTICLES TECHNIQUE: Complete ultrasound examination of the testicles, epididymis, and other scrotal structures was performed. Color and spectral Doppler ultrasound were also utilized to evaluate blood flow to the testicles. COMPARISON:  CT angiography abdomen and pelvis from 12/16/2022. FINDINGS: Right testicle Measurements: 2.7 x 3.3 x 5.0 cm. No mass or microlithiasis visualized. Left testicle Measurements: 2.2 x 3.3 x 4.5 cm. No mass or microlithiasis visualized. There is an ill-defined heterogeneous and hypoechoic 4 x 6 x 15 mm area in the subcapsular portion of the lower pole of the testicle. This is of indeterminate etiology but favored to represent sequela of prior infection, inflammation or hematoma. Right epididymis:  Normal in size and appearance. Left epididymis:  Normal in size and appearance. Hydrocele:  None visualized. Varicocele:   None visualized. Pulsed Doppler interrogation of both testes demonstrates normal low resistance arterial and venous waveforms bilaterally. Additional, evaluation of bilateral inguinal region was performed. There is a small-to-moderate fat containing right inguinal hernia and small-to-moderate fat and probable bowel containing left inguinal hernia. Please refer to CT scan abdomen and pelvis from 12/16/2022 for better visualization. IMPRESSION: *No evidence of testicular torsion. Bilateral inguinal hernias. Electronically Signed   By: Jules Schick M.D.   On: 07/06/2023 13:46   DG Chest Port 1 View  Result Date: 07/04/2023 CLINICAL DATA:  Shortness of breath.  Rapid response called. EXAM: PORTABLE CHEST 1 VIEW COMPARISON:  07/02/2023 FINDINGS: Single AP view of the chest demonstrates enlargement of the cardiac silhouette. Central vascular structures are mildly prominent but no overt pulmonary edema. Negative for a pneumothorax. No focal airspace disease or lung consolidation. The trachea is midline. IMPRESSION: Enlargement of the cardiac silhouette without overt pulmonary edema. Electronically Signed   By: Richarda Overlie M.D.   On: 07/04/2023 15:12   DG HIP PORT UNILAT WITH PELVIS 1V LEFT  Result Date: 07/04/2023 CLINICAL DATA:  Chronic left hip pain. EXAM: DG HIP (WITH OR WITHOUT PELVIS) 1V PORT LEFT COMPARISON:  None Available. FINDINGS: Mild-to-moderate left and mild right femoroacetabular joint space narrowing. Mild to moderate bilateral femoral head-neck junction degenerative osteophytosis. Mild bilateral superolateral acetabular degenerative osteophytosis. Mild bilateral sacroiliac subchondral sclerosis. The pubic symphysis joint space is maintained. No acute fracture or dislocation. 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 Electronically Signed   By: Neita Garnet M.D.   On: 07/04/2023 11:58   CT Angio Chest Pulmonary Embolism (PE) W or WO Contrast  Result  Date: 07/02/2023 CLINICAL DATA:  Audible wheezing. EXAM: CT ANGIOGRAPHY CHEST WITH CONTRAST TECHNIQUE: Multidetector CT imaging of the chest was performed using the standard protocol during bolus administration of intravenous contrast. Multiplanar CT image reconstructions and MIPs were obtained to evaluate the vascular anatomy. RADIATION DOSE REDUCTION: This exam was performed according to the departmental dose-optimization program which includes automated exposure control, adjustment of the mA and/or kV according to patient size and/or use of iterative reconstruction technique. CONTRAST:  OMNIPAQUE IOHEXOL 350 MG/ML SOLN COMPARISON:  Apr 13, 2023 FINDINGS: Cardiovascular: Thoracic aorta is normal in appearance. Satisfactory opacification of the pulmonary arteries to the segmental level. No evidence of pulmonary embolism. There is mild cardiomegaly with mild coronary artery calcification. No pericardial effusion. Mediastinum/Nodes: No enlarged mediastinal, hilar, or axillary lymph nodes. Thyroid gland,  trachea, and esophagus demonstrate no significant findings. Lungs/Pleura: Mild lingular, right middle lobe and bibasilar atelectasis is seen. A stable 4 mm left upper lobe lung nodule is noted. There is no evidence of acute infiltrate, pleural effusion or pneumothorax. Upper Abdomen: A punctate gallstone is seen within an otherwise normal-appearing gallbladder. Musculoskeletal: No chest wall abnormality. No acute or significant osseous findings. Review of the MIP images confirms the above findings. IMPRESSION: 1. No evidence of pulmonary embolism. 2. Mild lingular, right middle lobe and bibasilar atelectasis. 3. Stable 4 mm left upper lobe pulmonary nodule. No follow-up needed if patient is low-risk.This recommendation follows the consensus statement: Guidelines for Management of Incidental Pulmonary Nodules Detected on CT Images: From the Fleischner Society 2017; Radiology 2017; 284:228-243. 4. Cholelithiasis.  Electronically Signed   By: Aram Candela M.D.   On: 07/02/2023 21:16   DG Chest 2 View  Result Date: 07/02/2023 CLINICAL DATA:  Shortness of breath. EXAM: CHEST - 2 VIEW COMPARISON:  Chest x-ray dated Apr 16, 2023. FINDINGS: Unchanged cardiomegaly. Normal pulmonary vascularity. Mild streaky opacities at both lung bases consistent with atelectasis. No focal consolidation, pleural effusion, or pneumothorax. No acute osseous abnormality. IMPRESSION: Mild bibasilar atelectasis. Electronically Signed   By: Obie Dredge M.D.   On: 07/02/2023 15:00    Microbiology: Results for orders placed or performed during the hospital encounter of 07/02/23  Resp Panel by RT-PCR (Flu A&B, Covid) Anterior Nasal Swab     Status: None   Collection Time: 07/02/23  4:03 PM   Specimen: Anterior Nasal Swab  Result Value Ref Range Status   SARS Coronavirus 2 by RT PCR NEGATIVE NEGATIVE Final    Comment: (NOTE) SARS-CoV-2 target nucleic acids are NOT DETECTED.  The SARS-CoV-2 RNA is generally detectable in upper respiratory specimens during the acute phase of infection. The lowest concentration of SARS-CoV-2 viral copies this assay can detect is 138 copies/mL. A negative result does not preclude SARS-Cov-2 infection and should not be used as the sole basis for treatment or other patient management decisions. A negative result may occur with  improper specimen collection/handling, submission of specimen other than nasopharyngeal swab, presence of viral mutation(s) within the areas targeted by this assay, and inadequate number of viral copies(<138 copies/mL). A negative result must be combined with clinical observations, patient history, and epidemiological information. The expected result is Negative.  Fact Sheet for Patients:  BloggerCourse.com  Fact Sheet for Healthcare Providers:  SeriousBroker.it  This test is no t yet approved or cleared by the [place] FDA and  has been authorized for detection and/or diagnosis of SARS-CoV-2 by FDA under an Emergency Use Authorization (EUA). This EUA will remain  in effect (meaning this test can be used) for the duration of the COVID-19 declaration under Section 564(b)(1) of the Act, 21 U.S.C.section 360bbb-3(b)(1), unless the authorization is terminated  or revoked sooner.       Influenza A by PCR NEGATIVE NEGATIVE Final   Influenza B by PCR NEGATIVE NEGATIVE Final    Comment: (NOTE) The Xpert Xpress SARS-CoV-2/FLU/RSV plus assay is intended as an aid in the diagnosis of influenza from Nasopharyngeal swab specimens and should not be used as a sole basis for treatment. Nasal washings and aspirates are unacceptable for Xpert Xpress SARS-CoV-2/FLU/RSV testing.  Fact Sheet for Patients: BloggerCourse.com  Fact Sheet for Healthcare Providers: SeriousBroker.it  This test is not yet approved or cleared by the [place] FDA and has been authorized for detection and/or diagnosis of SARS-CoV-2 by FDA  under an Emergency Use Authorization (EUA). This EUA will remain in effect (meaning this test can be used) for the duration of the COVID-19 declaration under Section 564(b)(1) of the Act, 21 U.S.C. section 360bbb-3(b)(1), unless the authorization is terminated or revoked.  Performed at [facility], [address] [place], [place] [address]     Labs: CBC: Recent Labs  Lab 07/03/23 [phone] 07/04/23 [id number] 07/05/23 0555 07/06/23 0535 07/07/23 0619  WBC 13.3* 13.3* 10.0 8.2 8.2  HGB 15.0 14.6 14.3 13.9 15.5  HCT 48.0 46.0 46.3 43.7 47.8  MCV 89.1 87.8 90.1 88.5 86.0  PLT 238 227 190 201 188   Basic Metabolic Panel: Recent Labs  Lab 07/03/23 0936 07/04/23 0623 07/05/23 0555 07/06/23 0535 07/07/23 0619 07/08/23 0456 07/08/23 0457  NA 137 137 140 139 139  --  141  K 3.9 4.4 3.7 3.5 4.1  --  3.9  CL 95* 98 98 94* 95*  --   93*  CO2 30 30 35* 36* 37*  --  39*  GLUCOSE 176* 159* 92 81 107*  --  94  BUN 29* 31* 33* 32* 29*  --  34*  CREATININE 1.16 1.11 1.00 1.07 0.99  --  1.09  CALCIUM 8.8* 8.7* 8.4* 8.3* 8.5*  --  8.8*  MG 2.0  --  2.4  --   --  2.6*  --    Liver Function Tests: No results for input(s): "AST", "ALT", "ALKPHOS", "BILITOT", "PROT", "ALBUMIN" in the last 168 hours. CBG: Recent Labs  Lab 07/09/23 0756 07/09/23 1116 07/09/23 1558 07/09/23 2054 07/10/23 0817  GLUCAP 124* 165* 212* 116* 133*    Discharge time spent: greater than 30 minutes.  Signed: Enedina Finner, MD [facility] 07/10/2023

## 2023-07-10 NOTE — TOC Transition Note (Addendum)
Transition of Care [facility]) - CM/SW Discharge Note   Patient Details  Name: GABIN SCHWIETERMAN MRN: [id number] Date of Birth: 1961-10-02  Transition of Care [facility]) CM/SW Contact:  Darolyn Rua, LCSW Phone Number: 07/10/2023, 9:29 AM   Clinical Narrative:     Patient to discharge home today, patient previously declined HH and RW however does report being agreeable to RW this morning prior to discharge.   Patient requested [employer] for O2 needs, Morrie Sheldon with [employer] reports delivery will occur prior to 11 am today, RN and MD made aware. [employer] rep reports they also have walker they can give patient at same time.   No additional discharge needs at this time.   Final next level of care: Home/Self Care Barriers to Discharge: No Barriers Identified   Patient Goals and CMS Choice CMS Medicare.gov Compare Post Acute Care list provided to:: Patient Choice offered to / list presented to : Patient  Discharge Placement                         Discharge Plan and Services Additional resources added to the After Visit Summary for       Post Acute Care Choice: Home Health, Skilled Nursing Facility                               Social Determinants of Health (SDOH) Interventions SDOH Screenings   Food Insecurity: No Food Insecurity (07/03/2023)  Housing: Low Risk  (07/03/2023)  Transportation Needs: No Transportation Needs (07/03/2023)  Utilities: Not At Risk (07/03/2023)  Tobacco Use: High Risk (07/03/2023)     Readmission Risk Interventions     No data to display

## 2023-07-10 NOTE — Progress Notes (Signed)
Physical Therapy Treatment Patient Details Name: Kenneth Benson MRN: [id number] DOB: 1961-03-16 Today's Date: 07/10/2023   History of Present Illness Pt is a 62y/o male presenting due to acute exacerbation of COPD and hypoxic respiratory failure. PMH inlcudes Afib, AKI, CHF, smoking, COPD, MI, chronic asthma, and obesity. Pt began amiodarone drip 8/5.    PT Comments  Pt in and out of bed with no assist.  Is able to walk x 1 lap with RW with mod I - assist needed for tank.  RW decreases back pain which improves gait quality.  Overall progressing well with no further PT concerns at this time.  Discussed O2 cord management and pulse ox use as needed to monitor sats at home and discussed energy conservation and red flags with breathing and pulse Ox numbers.  Pt comfortable and excited to go home.   If plan is discharge home, recommend the following: A little help with walking and/or transfers;Assistance with cooking/housework;Assist for transportation;Help with stairs or ramp for entrance   Can travel by private vehicle        Equipment Recommendations  Rolling walker (2 wheels)    Recommendations for Other Services       Precautions / Restrictions Precautions Precautions: Fall Restrictions Weight Bearing Restrictions: No     Mobility  Bed Mobility Overal bed mobility: Modified Independent               Patient Response: Cooperative  Transfers Overall transfer level: Modified independent                      Ambulation/Gait Ambulation/Gait assistance: Supervision Gait Distance (Feet): 200 Feet Assistive device: Rolling walker (2 wheels) Gait Pattern/deviations: Step-through pattern Gait velocity: decreased     General Gait Details: no outside assist needed, independant in room without AD   Stairs             Wheelchair Mobility     Tilt Bed Tilt Bed Patient Response: Cooperative  Modified Rankin (Stroke Patients Only)       Balance  Overall balance assessment: Modified Independent, Mild deficits observed, not formally tested                                          Cognition Arousal: Alert Behavior During Therapy: WFL for tasks assessed/performed Overall Cognitive Status: Within Functional Limits for tasks assessed                                          Exercises      General Comments        Pertinent Vitals/Pain Pain Assessment Pain Assessment: Faces Faces Pain Scale: Hurts a little bit Pain Location: back but improved with walker use Pain Descriptors / Indicators: Discomfort, Grimacing Pain Intervention(s): Limited activity within patient's tolerance, Monitored during session, Repositioned    Home Living                          Prior Function            PT Goals (current goals can now be found in the care plan section) Progress towards PT goals: Progressing toward goals    Frequency    Min 1X/week      PT  Plan      Co-evaluation              AM-PAC PT "6 Clicks" Mobility   Outcome Measure  Help needed turning from your back to your side while in a flat bed without using bedrails?: None Help needed moving from lying on your back to sitting on the side of a flat bed without using bedrails?: None Help needed moving to and from a bed to a chair (including a wheelchair)?: None Help needed standing up from a chair using your arms (e.g., wheelchair or bedside chair)?: None Help needed to walk in hospital room?: None Help needed climbing 3-5 steps with a railing? : A Little 6 Click Score: 23    End of Session Equipment Utilized During Treatment: Gait belt Activity Tolerance: Patient tolerated treatment well;Other (comment) Patient left: in bed;with call bell/phone within reach;with bed alarm set Nurse Communication: Mobility status PT Visit Diagnosis: Other abnormalities of gait and mobility (R26.89);Difficulty in walking, not  elsewhere classified (R26.2)     Time: 4098-1191 PT Time Calculation (min) (ACUTE ONLY): 17 min  Charges:    $Gait Training: 8-22 mins PT General Charges $$ ACUTE PT VISIT: 1 Visit                    Danielle Dess, PTA 07/10/23, 9:38 AM

## 2023-07-19 ENCOUNTER — Encounter: Payer: Medicaid Other | Admitting: Family

## 2023-07-19 ENCOUNTER — Telehealth: Payer: Self-pay | Admitting: Family

## 2023-07-19 NOTE — Telephone Encounter (Signed)
Patient did not show for his Heart Failure Clinic appointment on 07/19/23.

## 2023-08-22 ENCOUNTER — Inpatient Hospital Stay
Admission: EM | Admit: 2023-08-22 | Discharge: 2023-09-01 | DRG: 190 | Disposition: A | Discharge status: Home / Self Care | Payer: Medicaid Other | Attending: Internal Medicine | Admitting: Internal Medicine

## 2023-08-22 ENCOUNTER — Emergency Department: Payer: Medicaid Other

## 2023-08-22 ENCOUNTER — Other Ambulatory Visit: Payer: Self-pay

## 2023-08-22 DIAGNOSIS — E669 Obesity, unspecified: Secondary | ICD-10-CM | POA: Diagnosis present

## 2023-08-22 DIAGNOSIS — Z79899 Other long term (current) drug therapy: Secondary | ICD-10-CM

## 2023-08-22 DIAGNOSIS — I959 Hypotension, unspecified: Secondary | ICD-10-CM | POA: Diagnosis not present

## 2023-08-22 DIAGNOSIS — I5042 Chronic combined systolic (congestive) and diastolic (congestive) heart failure: Secondary | ICD-10-CM | POA: Diagnosis present

## 2023-08-22 DIAGNOSIS — J9611 Chronic respiratory failure with hypoxia: Secondary | ICD-10-CM | POA: Diagnosis not present

## 2023-08-22 DIAGNOSIS — Z8249 Family history of ischemic heart disease and other diseases of the circulatory system: Secondary | ICD-10-CM

## 2023-08-22 DIAGNOSIS — I4891 Unspecified atrial fibrillation: Principal | ICD-10-CM

## 2023-08-22 DIAGNOSIS — J441 Chronic obstructive pulmonary disease with (acute) exacerbation: Secondary | ICD-10-CM | POA: Diagnosis not present

## 2023-08-22 DIAGNOSIS — I4811 Longstanding persistent atrial fibrillation: Secondary | ICD-10-CM | POA: Diagnosis present

## 2023-08-22 DIAGNOSIS — Z1152 Encounter for screening for COVID-19: Secondary | ICD-10-CM

## 2023-08-22 DIAGNOSIS — F17210 Nicotine dependence, cigarettes, uncomplicated: Secondary | ICD-10-CM | POA: Diagnosis present

## 2023-08-22 DIAGNOSIS — Z9981 Dependence on supplemental oxygen: Secondary | ICD-10-CM

## 2023-08-22 DIAGNOSIS — Z7984 Long term (current) use of oral hypoglycemic drugs: Secondary | ICD-10-CM

## 2023-08-22 DIAGNOSIS — J9621 Acute and chronic respiratory failure with hypoxia: Secondary | ICD-10-CM | POA: Diagnosis present

## 2023-08-22 DIAGNOSIS — I10 Essential (primary) hypertension: Secondary | ICD-10-CM | POA: Diagnosis present

## 2023-08-22 DIAGNOSIS — I480 Paroxysmal atrial fibrillation: Secondary | ICD-10-CM | POA: Diagnosis not present

## 2023-08-22 DIAGNOSIS — Z7901 Long term (current) use of anticoagulants: Secondary | ICD-10-CM

## 2023-08-22 DIAGNOSIS — R001 Bradycardia, unspecified: Secondary | ICD-10-CM | POA: Diagnosis not present

## 2023-08-22 DIAGNOSIS — I110 Hypertensive heart disease with heart failure: Secondary | ICD-10-CM | POA: Diagnosis present

## 2023-08-22 DIAGNOSIS — Z6839 Body mass index (BMI) 39.0-39.9, adult: Secondary | ICD-10-CM

## 2023-08-22 LAB — CBC
HCT: 50.3 % (ref 39.0–52.0)
Hemoglobin: 16 g/dL (ref 13.0–17.0)
MCH: 29 pg (ref 26.0–34.0)
MCHC: 31.8 g/dL (ref 30.0–36.0)
MCV: 91.3 fL (ref 80.0–100.0)
Platelets: 228 10*3/uL (ref 150–400)
RBC: 5.51 MIL/uL (ref 4.22–5.81)
RDW: 18.6 % — ABNORMAL HIGH (ref 11.5–15.5)
WBC: 7.7 10*3/uL (ref 4.0–10.5)
nRBC: 0 % (ref 0.0–0.2)

## 2023-08-22 LAB — BASIC METABOLIC PANEL
Anion gap: 10 (ref 5–15)
BUN: 28 mg/dL — ABNORMAL HIGH (ref 6–20)
CO2: 32 mmol/L (ref 22–32)
Calcium: 9 mg/dL (ref 8.9–10.3)
Chloride: 95 mmol/L — ABNORMAL LOW (ref 98–111)
Creatinine, Ser: 0.97 mg/dL (ref 0.61–1.24)
GFR, Estimated: >60 mL/min (ref >60)
Glucose, Bld: 106 mg/dL — ABNORMAL HIGH (ref 70–99)
Potassium: 4.1 mmol/L (ref 3.5–5.1)
Sodium: 137 mmol/L (ref 135–145)

## 2023-08-22 LAB — PROCALCITONIN: Procalcitonin: <0.1 ng/mL

## 2023-08-22 LAB — SARS CORONAVIRUS 2 BY RT PCR: SARS Coronavirus 2 by RT PCR: NEGATIVE

## 2023-08-22 LAB — TROPONIN I (HIGH SENSITIVITY): Troponin I (High Sensitivity): 17 ng/L (ref <18)

## 2023-08-22 MED ORDER — SODIUM CHLORIDE 0.9 % IV SOLN
500.0000 mg | INTRAVENOUS | Status: AC
  Administered 2023-08-22 – 2023-08-26 (×5): 500 mg via INTRAVENOUS
  Filled 2023-08-22 (×6): qty 5

## 2023-08-22 MED ORDER — IPRATROPIUM-ALBUTEROL 0.5-2.5 (3) MG/3ML IN SOLN
3.0000 mL | Freq: Once | RESPIRATORY_TRACT | Status: AC
  Administered 2023-08-22: 3 mL via RESPIRATORY_TRACT
  Filled 2023-08-22: qty 3

## 2023-08-22 MED ORDER — IPRATROPIUM-ALBUTEROL 0.5-2.5 (3) MG/3ML IN SOLN
3.0000 mL | Freq: Four times a day (QID) | RESPIRATORY_TRACT | Status: DC
  Administered 2023-08-22 – 2023-08-23 (×4): 3 mL via RESPIRATORY_TRACT
  Filled 2023-08-22 (×4): qty 3

## 2023-08-22 MED ORDER — ARFORMOTEROL TARTRATE 15 MCG/2ML IN NEBU
15.0000 ug | INHALATION_SOLUTION | Freq: Two times a day (BID) | RESPIRATORY_TRACT | Status: DC
  Filled 2023-08-22 (×2): qty 2

## 2023-08-22 MED ORDER — METHYLPREDNISOLONE SODIUM SUCC 125 MG IJ SOLR
125.0000 mg | Freq: Once | INTRAMUSCULAR | Status: AC
  Administered 2023-08-22: 125 mg via INTRAVENOUS
  Filled 2023-08-22: qty 2

## 2023-08-22 MED ORDER — SODIUM CHLORIDE 0.9 % IV SOLN
1.0000 g | INTRAVENOUS | Status: DC
  Administered 2023-08-22: 1 g via INTRAVENOUS
  Filled 2023-08-22: qty 10

## 2023-08-22 MED ORDER — SODIUM CHLORIDE 0.9% FLUSH
3.0000 mL | Freq: Two times a day (BID) | INTRAVENOUS | Status: DC
  Administered 2023-08-23 – 2023-08-30 (×14): 3 mL via INTRAVENOUS

## 2023-08-22 MED ORDER — EMPAGLIFLOZIN 10 MG PO TABS
10.0000 mg | ORAL_TABLET | Freq: Every day | ORAL | Status: DC
  Administered 2023-08-24 – 2023-08-30 (×7): 10 mg via ORAL
  Filled 2023-08-22 (×8): qty 1

## 2023-08-22 MED ORDER — APIXABAN 5 MG PO TABS
5.0000 mg | ORAL_TABLET | Freq: Two times a day (BID) | ORAL | Status: DC
  Administered 2023-08-22 – 2023-09-01 (×20): 5 mg via ORAL
  Filled 2023-08-22 (×20): qty 1

## 2023-08-22 MED ORDER — PREDNISONE 20 MG PO TABS
40.0000 mg | ORAL_TABLET | Freq: Every day | ORAL | Status: AC
  Administered 2023-08-23 – 2023-08-27 (×5): 40 mg via ORAL
  Filled 2023-08-22 (×5): qty 2

## 2023-08-22 MED ORDER — ACETAMINOPHEN 325 MG PO TABS
650.0000 mg | ORAL_TABLET | Freq: Four times a day (QID) | ORAL | Status: DC | PRN
  Administered 2023-08-22 – 2023-08-25 (×4): 650 mg via ORAL
  Filled 2023-08-22 (×7): qty 2

## 2023-08-22 MED ORDER — METOPROLOL TARTRATE 25 MG PO TABS
25.0000 mg | ORAL_TABLET | Freq: Two times a day (BID) | ORAL | Status: DC
  Administered 2023-08-23 – 2023-08-25 (×5): 25 mg via ORAL
  Filled 2023-08-22 (×5): qty 1

## 2023-08-22 MED ORDER — DILTIAZEM HCL-DEXTROSE 125-5 MG/125ML-% IV SOLN (PREMIX)
5.0000 mg/h | INTRAVENOUS | Status: DC
  Administered 2023-08-22: 5 mg/h via INTRAVENOUS
  Administered 2023-08-22 – 2023-08-24 (×4): 15 mg/h via INTRAVENOUS
  Administered 2023-08-25 – 2023-08-26 (×2): 5 mg/h via INTRAVENOUS
  Filled 2023-08-22 (×8): qty 125

## 2023-08-22 MED ORDER — DILTIAZEM HCL 25 MG/5ML IV SOLN
15.0000 mg | Freq: Once | INTRAVENOUS | Status: AC
  Administered 2023-08-22: 15 mg via INTRAVENOUS
  Filled 2023-08-22: qty 5

## 2023-08-22 MED ORDER — SPIRONOLACTONE 12.5 MG HALF TABLET
12.5000 mg | ORAL_TABLET | Freq: Every day | ORAL | Status: DC
  Filled 2023-08-22: qty 1

## 2023-08-22 MED ORDER — ONDANSETRON HCL 4 MG/2ML IJ SOLN: 4.0000 mg | Freq: Four times a day (QID) | INTRAMUSCULAR | Status: DC | PRN

## 2023-08-22 MED ORDER — ONDANSETRON HCL 4 MG PO TABS: 4.0000 mg | ORAL_TABLET | Freq: Four times a day (QID) | ORAL | Status: DC | PRN

## 2023-08-22 MED ORDER — TORSEMIDE 20 MG PO TABS: 40.0000 mg | ORAL_TABLET | Freq: Every day | ORAL | Status: DC

## 2023-08-22 MED ORDER — ACETAMINOPHEN 650 MG RE SUPP: 650.0000 mg | Freq: Four times a day (QID) | RECTAL | Status: DC | PRN

## 2023-08-22 NOTE — TOC Progression Note (Signed)
Transition of Care [facility]) - Progression Note    Patient Details  Name: Kenneth Benson MRN: [id number] Date of Birth: [birth year]  Transition of Care [facility]) CM/SW Contact  Marquita Palms, LCSW Phone Number: 08/22/2023, 12:54 PM  Clinical Narrative:     CSW met with patient bedside with his significant other. Patient complained that he was living in a house that had rats and he needed help. CSW explained that she would bring back some resources. Pt reported that he had insurance but cannot get a PCP because he does not have a photo ID and is unable to get one due to his ailing condition. Patient is currently on O2 and is having a hard time breathing while speaking with CSW. CSW added information for housing resources to the AVS and printed information for DMV.       Expected Discharge Plan and Services                                               Social Determinants of Health (SDOH) Interventions SDOH Screenings   Food Insecurity: No Food Insecurity (08/22/2023)  Housing: Low Risk  (08/22/2023)  Transportation Needs: No Transportation Needs (08/22/2023)  Utilities: Not At Risk (08/22/2023)  Tobacco Use: High Risk (08/22/2023)    Readmission Risk Interventions     No data to display

## 2023-08-22 NOTE — ED Notes (Signed)
Dinner tray delivered.

## 2023-08-22 NOTE — Assessment & Plan Note (Signed)
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  - Continue supplemental oxygen to maintain oxygen saturation above 88% - S/p Solu-Medrol 125 mg once - Start prednisone 40 mg tomorrow to complete a 5-day course - Start ceftriaxone and azithromycin - Procalcitonin pending - DuoNebs every 6 hours - Continue home bronchodilators

## 2023-08-22 NOTE — Assessment & Plan Note (Signed)
Likely secondary to COPD exacerbation.  Per chart review, he frequently has RVR during COPD exacerbations.  - Telemetry monitoring - Continue home metoprolol - Continue diltiazem infusion - Continue home Eliquis

## 2023-08-22 NOTE — Discharge Instructions (Signed)
Rent/Utility/Housing  Agency Name: [facility] Address: [address] [place], [place] [address] Phone: [phone] Email: [email or web address] Website: [email or web address] Service(s) Offered: Housing services, self-sufficiency, congregate meal program, weatherization program, Field seismologist program, emergency food assistance,  housing counseling, home ownership program, wheels -towork program.  Agency Name: [facility] Address: [address], [place], [place] [address] Phone: [phone] (8a-4p) [phone] (8p- 10p) Email: [email or web address] Website: [email or web address] Service(s) Offered: A program for homeless and/or needy men that includes one-on-one counseling, life skills training and job rehabilitation.  Agency Name: [facility] Address: [address] [place], [place] [address] Phone: [phone] Website: www.[email or web address] Service(s) Offered: Assistance to needy in emergency with utility bills, heating fuel, and prescriptions. Shelter for homeless 7pm-7am. March 23, 2017 15  Agency Name: [facility] [place] [facility] Address: [address], [place], [place] [address] Phone: [phone] Contact Person: Cathleen Corti Email: [email or web address] Website: [email or web address] Service(s) Offered: Helps individuals with developmental disabilities move from housing that is more restrictive to homes where they  can achieve greater independence and have more  opportunities.  Agency Name: [facility] Address: [address], [place], [place] [address] Phone: [phone] Email: [email or web address] Website: [email or web address].org Service(s) Offered: Provides affordable housing for low-income families, elderly, and disabled individuals. Offer a wide range of  programs and services, from financial planning to  afterschool and summer programs.  Agency Name: [facility] Address: [address] [place], [place] [address] Phone: [phone] Service(s) Offered: Child support services; child welfare services; food stamps; Medicaid; work first family assistance; and aid with fuel,  rent, food and medicine.  Agency Name: [facility] Address: [address] [place], [place] [address] Phone: [phone] Email: [email or web address] Website: [email or web address].es Service(s) Offered: Public housing units for elderly, disabled, and low income people; housing choice vouchers for income eligible  applicants; shelter plus care vouchers; and Psychologist, clinical.  Agency Name: [facility] Address: [address] [place], [place] [address] Phone: [phone] Email: [email or web address] Website: www.habitatalamance.org Service(s) Offered: Build houses for families in need of decent housing. Each adult in the family must invest 200 hours of labor on  someone else's house, work with volunteers to build their own house, attend classes on budgeting, home maintenance, yard care, and attend homeowner association meetings. Resources giving to patient who asked for help.

## 2023-08-22 NOTE — Assessment & Plan Note (Signed)
Currently maintain oxygen saturation on home 2 L.  - Continue home 2 L of supplemental oxygen

## 2023-08-22 NOTE — ED Notes (Signed)
Attending MD at bedside assessing and educating patients.

## 2023-08-22 NOTE — ED Notes (Addendum)
Dose rate change @1427  with verbal feedback from MD at bedside that it is okay and wihtin parameters to increase dose to 10mg /hr. BP: 105/87, HR: 120

## 2023-08-22 NOTE — ED Notes (Signed)
Patient states "I have stopped smoking since the 72 th of September, I've been coughing up stringy black stuff, fibers"

## 2023-08-22 NOTE — ED Notes (Signed)
EDP at bedside  

## 2023-08-22 NOTE — ED Triage Notes (Signed)
Pt to ED for shob for several days. States has been treating with nebs but now they are not working. Wheezing noted.  Wears 2 L [place] at home.  Had nebs PTA

## 2023-08-22 NOTE — ED Notes (Signed)
Patient complaining of Chest hurting. Pain is a 6(0-10).

## 2023-08-22 NOTE — H&P (Signed)
History and Physical    Patient: Kenneth Benson YSA:[id number] DOB: Dec 04, 1960 DOA: 08/22/2023 DOS: the patient was seen and examined on 08/22/2023 PCP: Pcp, No  Patient coming from: Home  Chief Complaint:  Chief Complaint  Patient presents with   Shortness of Breath   HPI: Kenneth Benson is a 62 y.o. 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  [name] states that over the last few days, he has been experiencing gradually worsening shortness of breath and feeling that his lungs are tight.  In addition, he seems that mucus accumulation has been recurring faster and is more difficult for him to clear but otherwise, he does not feel that he is coughing more.  In addition, he had some chest pain and back pain when he takes deep breaths but it does not occur when he is not deep breathing.  He denies any fever, chills, nausea, vomiting, diarrhea, palpitations.  ED course: On arrival to the ED, patient was normotensive at 122/76 with heart rate of 154.  He was saturating at 95% on 4 L and was subsequently weaned to his home 2 L.  He was tachypneic up to 32/min.  He was afebrile at 98.3. Initial workup notable for unremarkable CBC, troponin, and BNP notable only for glucose of 106 and BUN of 28.  Renal function intact with GFR above 60.  COVID-19 PCR negative.  Chest x-ray with no acute changes.  EKG obtained and notable for atrial fibrillation with RVR.  Patient started on Dilt infusion and COPD exacerbation treatment with DuoNebs and Solu-Medrol.  TR[facility] contacted for admission.  Review of Systems: As mentioned in the history of present illness. All other systems reviewed and are negative.  Past Medical History:  Diagnosis Date   A-fib (HCC) 02/06/2022   AKI (acute kidney injury) (HCC) 12/17/2022   Arrhythmia     atrial fibrillation   Asthma    CHF (congestive heart failure) (HCC)    COPD (chronic obstructive pulmonary disease) (HCC)    Hyperkalemia 12/17/2022   Hypokalemia 04/13/2023   Myocardial injury 04/13/2023   Tobacco abuse    History reviewed. No pertinent surgical history. Social History:  reports that he has been smoking cigarettes. He has a 40.5 pack-year smoking history. He does not have any smokeless tobacco history on file. He reports that he does not drink alcohol and does not use drugs.  No Known Allergies  Family History  Problem Relation Age of Onset   Heart disease Mother    Atrial fibrillation Father     Prior to Admission medications   Medication Sig Start Date End Date Taking? Authorizing Provider  albuterol (PROVENTIL) (2.5 MG/3ML) 0.083% nebulizer solution Take 3 mLs (2.5 mg total) by nebulization every 6 (six) hours as needed for wheezing or shortness of breath. 07/10/23 09/08/23  Enedina Finner, MD  albuterol (VENTOLIN HFA) 108 (90 Base) MCG/ACT inhaler Inhale 2 puffs into the lungs every 6 (six) hours as needed for wheezing or shortness of breath. 07/10/23   Enedina Finner, MD  apixaban (ELIQUIS) 5 MG TABS tablet Take 1 tablet (5 mg total) by mouth 2 (two) times daily. 07/10/23   Enedina Finner, MD  arformoterol (BROVANA) 15 MCG/2ML NEBU Take 2 mLs (15 mcg total) by nebulization 2 (two) times daily. 07/10/23 09/08/23  Enedina Finner, MD  diltiazem (  CARDIZEM CD) 240 MG 24 hr capsule Take 1 capsule (240 mg total) by mouth daily. 07/10/23   Enedina Finner, MD  JARDIANCE 10 MG TABS tablet Take 1 tablet (10 mg total) by mouth daily. 07/10/23   Enedina Finner, MD  metoprolol tartrate (LOPRESSOR) 50 MG tablet Take 1 tablet (50 mg total) by mouth 2 (two) times daily. 07/10/23 10/08/23  Enedina Finner, MD  spironolactone (ALDACTONE) 25 MG tablet Take 0.5 tablets (12.5 mg total) by mouth daily. 07/10/23   Enedina Finner, MD  torsemide (DEMADEX) 20 MG tablet Take 2 tablets (40 mg total) by mouth daily. 07/10/23  09/08/23  Enedina Finner, MD  amiodarone (PACERONE) 200 MG tablet Take 1 tablet (200 mg total) by mouth 2 (two) times daily. 02/11/22 03/15/22  Lewie Chamber, MD    Physical Exam: Vitals:   08/22/23 1515 08/22/23 1530 08/22/23 1545 08/22/23 1557  BP: 105/87 91/74 106/76 (!) 107/93  Pulse: (!) 133 (!) 111 (!) 118 (!) 129  Resp: (!) 25 (!) 24 (!) 26 (!) 29  Temp:      TempSrc:      SpO2:    93%  Weight:      Height:       Physical Exam Vitals and nursing note reviewed.  Constitutional:      General: He is not in acute distress.    Appearance: He is obese.  HENT:     Head: Normocephalic and atraumatic.     Mouth/Throat:     Mouth: Mucous membranes are moist.     Pharynx: Oropharynx is clear.  Neck:     Vascular: No JVD.  Cardiovascular:     Rate and Rhythm: Tachycardia present. Rhythm irregular.  Pulmonary:     Effort: Tachypnea present. No respiratory distress.     Breath sounds: Wheezing (Diffuse wheezing throughout) present. No rhonchi or rales.  Abdominal:     Palpations: Abdomen is soft.     Tenderness: There is no abdominal tenderness.  Musculoskeletal:     Cervical back: Neck supple.     Right lower leg: No edema.     Left lower leg: No edema.  Skin:    General: Skin is warm and dry.  Neurological:     General: No focal deficit present.     Mental Status: He is alert and oriented to person, place, and time.  Psychiatric:        Mood and Affect: Mood normal.        Behavior: Behavior normal.    Data Reviewed: CBC with WBC of 7.7, hemoglobin of 16.0, MCV of 91.3 and platelets of 228 BMP with sodium of 137, potassium 4.1, bicarb 32, glucose 106, BUN 28, creatinine 0.97 with GFR above 60 Troponin negative at 17 COVID-19 PCR negative  EKG personally reviewed.  Narrow complex irregular rhythm with rate of 149 consistent with atrial fibrillation with RVR.  No ST or T wave changes concerning for acute ischemia.  DG Chest Port 1 View  Result Date:  08/22/2023 CLINICAL DATA:  Shortness of breath for several days. Wheezing. On home oxygen. EXAM: PORTABLE CHEST 1 VIEW COMPARISON:  Radiographs 07/04/2023 and 07/02/2023.  CT 07/02/2023. FINDINGS: 1149 hours. Lordotic positioning. Stable cardiomegaly and mild bibasilar atelectasis. No edema, confluent airspace disease, pleural effusion or pneumothorax. The bones appear unchanged. Telemetry leads overlie the chest. IMPRESSION: Stable cardiomegaly and mild bibasilar atelectasis. No acute cardiopulmonary process. Electronically Signed   By: Carey Bullocks M.D.   On: 08/22/2023 13:38  Results are pending, will review when available.  Assessment and Plan:  * COPD with acute exacerbation (HCC) Patient has a history of severe end-stage COPD on triple bronchodilators with recurrent COPD exacerbations over the last few months, coming in with increased shortness of breath and cough over the last few days.  - Continue supplemental oxygen to maintain oxygen saturation above 88% - S/p Solu-Medrol 125 mg once - Start prednisone 40 mg tomorrow to complete a 5-day course - Start ceftriaxone and azithromycin - Procalcitonin pending - DuoNebs every 6 hours - Continue home bronchodilators  Paroxysmal atrial fibrillation with RVR (HCC) Likely secondary to COPD exacerbation.  Per chart review, he frequently has RVR during COPD exacerbations.  - Telemetry monitoring - Continue home metoprolol - Continue diltiazem infusion - Continue home Eliquis  Chronic hypoxic respiratory failure (HCC) Currently maintain oxygen saturation on home 2 L.  - Continue home 2 L of supplemental oxygen  Chronic combined systolic and diastolic heart failure (HCC) Patient is euvolemic on examination, and he states he has been strictly compliant with his home regimen.  - Continue home Jardiance, torsemide, spironolactone - Daily weights  HTN (hypertension) Blood pressure has been at goal.  - Continue home  regimen  Advance Care Planning:   Code Status: Full Code verified by patient with friend [name] at bedside.  He states that he understands he is at high risk of not being able to wean from a ventilator again, but he would like full resuscitative efforts to be made.  Consults: None  Family Communication: Patient's friend [name] updated at bedside  Severity of Illness: The appropriate patient status for this patient is OBSERVATION. Observation status is judged to be reasonable and necessary in order to provide the required intensity of service to ensure the patient's safety. The patient's presenting symptoms, physical exam findings, and initial radiographic and laboratory data in the context of their medical condition is felt to place them at decreased risk for further clinical deterioration. Furthermore, it is anticipated that the patient will be medically stable for discharge from the hospital within 2 midnights of admission.   Author: Verdene Lennert, MD 08/22/2023 4:06 PM  For on call review [email or web address].

## 2023-08-22 NOTE — Assessment & Plan Note (Addendum)
Patient is euvolemic on examination, and he states he has been strictly compliant with his home regimen.  - Continue home Jardiance, torsemide, spironolactone - Daily weights

## 2023-08-22 NOTE — ED Notes (Signed)
Dosage changed to 15 mg/hr per MD verbal order parameters.

## 2023-08-22 NOTE — ED Provider Notes (Signed)
[facility] Provider Note    Event Date/Time   First MD Initiated Contact with Patient 08/22/23 1111     (approximate)   History   Shortness of Breath   HPI  Kenneth Benson is a 62 y.o. male who presents to the emergency department today because of concerns for shortness of breath.  Patient does have a history of COPD.  Most recent episode of shortness of breath started a few days ago.  The patient has been using his home breathing treatments without any significant relief.  He is on 2 L of oxygen at baseline.  He gets extremely short of breath with minimal exertion.  Patient denies any fevers.     Physical Exam   Triage Vital Signs: ED Triage Vitals  Encounter Vitals Group     BP 08/22/23 1047 122/76     Systolic BP Percentile --      Diastolic BP Percentile --      Pulse Rate 08/22/23 1047 (!) 154     Resp 08/22/23 1045 (!) 25     Temp 08/22/23 1045 98.3 F (36.8 C)     Temp src --      SpO2 08/22/23 1047 95 %     Weight 08/22/23 1046 270 lb (122.5 kg)     Height 08/22/23 1046 6' (1.829 m)     Head Circumference --      Peak Flow --      Pain Score 08/22/23 1046 0     Pain Loc --      Pain Education --      Exclude from Growth Chart --     Most recent vital signs: Vitals:   08/22/23 1045 08/22/23 1047  BP:  122/76  Pulse:  (!) 154  Resp: (!) 25   Temp: 98.3 F (36.8 C)   SpO2:  95%    General: Awake, alert, oriented. CV:  Good peripheral perfusion. Tachycardia, irregular rhythm. Resp:  Increased work of breathing. Diffuse expiratory wheezing. Abd:  No distention.    ED Results / Procedures / Treatments   Labs (all labs ordered are listed, but only abnormal results are displayed) Labs Reviewed  CBC - Abnormal; Notable for the following components:      Result Value   RDW 18.6 (*)    All other components within normal limits  BASIC METABOLIC PANEL - Abnormal; Notable for the following components:   Chloride 95 (*)     Glucose, Bld 106 (*)    BUN 28 (*)    All other components within normal limits  SARS CORONAVIRUS 2 BY RT PCR  TROPONIN I (HIGH SENSITIVITY)     EKG  I, Phineas Semen, attending physician, personally viewed and interpreted this EKG  EKG Time: 1056 Rate: 149 Rhythm: atrail fibrillation with RVR Axis: normal Intervals: qtc 491 QRS: narrow ST changes: no st elevation Impression: abnormal ekg   RADIOLOGY I independently interpreted and visualized the CXR. My interpretation: No pneumonia Radiology interpretation:  IMPRESSION:  Stable cardiomegaly and mild bibasilar atelectasis. No acute  cardiopulmonary process.      PROCEDURES:  Critical Care performed: Yes  CRITICAL CARE Performed by: Phineas Semen   Total critical care time: 30 minutes  Critical care time was exclusive of separately billable procedures and treating other patients.  Critical care was necessary to treat or prevent imminent or life-threatening deterioration.  Critical care was time spent personally by me on the following activities: development of treatment plan  with patient and/or surrogate as well as nursing, discussions with consultants, evaluation of patient's response to treatment, examination of patient, obtaining history from patient or surrogate, ordering and performing treatments and interventions, ordering and review of laboratory studies, ordering and review of radiographic studies, pulse oximetry and re-evaluation of patient's condition.   Procedures    MEDICATIONS ORDERED IN ED: Medications - No data to display   IMPRESSION / MDM / ASSESSMENT AND PLAN / ED COURSE  I reviewed the triage vital signs and the nursing notes.                              Differential diagnosis includes, but is not limited to, COPD, CHF, pneumonia  Patient's presentation is most consistent with acute presentation with potential threat to life or bodily function.  The patient is on the cardiac  monitor to evaluate for evidence of arrhythmia and/or significant heart rate changes.  Patient presented to the emergency department today because of concerns for shortness of breath.  Patient has history of A-fib and COPD.  On exam patient does have increased work of breathing and diffuse expiratory wheezing.  Would have concerns for possible COPD exacerbation.  Will give steroids.  Will check chest x-ray and blood work. Did order dose of diltiazem for afib with rvr.  Chest x-ray without any pneumonia or evidence of edema.  Patient's breathing did improve after breathing treatments and steroids.  However heart rate without any significant improvement with dose of diltiazem.  This time will place patient on diltiazem drip.  Discussed with Dr. Huel Cote with the hospitalist service who will evaluate for admission.      FINAL CLINICAL IMPRESSION(S) / ED DIAGNOSES   Final diagnoses:  Atrial fibrillation with RVR (HCC)     Note:  This document was prepared using Dragon voice recognition software and may include unintentional dictation errors.    Phineas Semen, MD 08/22/23 [phone]

## 2023-08-22 NOTE — ED Notes (Signed)
Patient received sprite.

## 2023-08-22 NOTE — ED Notes (Signed)
Pt no longer coughing, restarted duoneb. Turned oxygen to 2L.

## 2023-08-22 NOTE — ED Notes (Signed)
Pt has shallow cough, started neb and started coughing uncontrollably. Pt spitting into bag.

## 2023-08-22 NOTE — ED Notes (Signed)
Provided sprite per request.

## 2023-08-22 NOTE — Assessment & Plan Note (Signed)
Blood pressure has been at goal.  - Continue home regimen

## 2023-08-23 DIAGNOSIS — Z1152 Encounter for screening for COVID-19: Secondary | ICD-10-CM | POA: Diagnosis not present

## 2023-08-23 DIAGNOSIS — J441 Chronic obstructive pulmonary disease with (acute) exacerbation: Secondary | ICD-10-CM | POA: Diagnosis present

## 2023-08-23 DIAGNOSIS — R0602 Shortness of breath: Secondary | ICD-10-CM | POA: Diagnosis present

## 2023-08-23 DIAGNOSIS — J9621 Acute and chronic respiratory failure with hypoxia: Secondary | ICD-10-CM | POA: Diagnosis present

## 2023-08-23 DIAGNOSIS — Z6839 Body mass index (BMI) 39.0-39.9, adult: Secondary | ICD-10-CM | POA: Diagnosis not present

## 2023-08-23 DIAGNOSIS — I4811 Longstanding persistent atrial fibrillation: Secondary | ICD-10-CM | POA: Diagnosis present

## 2023-08-23 DIAGNOSIS — F17210 Nicotine dependence, cigarettes, uncomplicated: Secondary | ICD-10-CM | POA: Diagnosis present

## 2023-08-23 DIAGNOSIS — R001 Bradycardia, unspecified: Secondary | ICD-10-CM | POA: Diagnosis not present

## 2023-08-23 DIAGNOSIS — Z79899 Other long term (current) drug therapy: Secondary | ICD-10-CM | POA: Diagnosis not present

## 2023-08-23 DIAGNOSIS — I5042 Chronic combined systolic (congestive) and diastolic (congestive) heart failure: Secondary | ICD-10-CM | POA: Diagnosis present

## 2023-08-23 DIAGNOSIS — Z7901 Long term (current) use of anticoagulants: Secondary | ICD-10-CM | POA: Diagnosis not present

## 2023-08-23 DIAGNOSIS — Z7984 Long term (current) use of oral hypoglycemic drugs: Secondary | ICD-10-CM | POA: Diagnosis not present

## 2023-08-23 DIAGNOSIS — Z9981 Dependence on supplemental oxygen: Secondary | ICD-10-CM | POA: Diagnosis not present

## 2023-08-23 DIAGNOSIS — E669 Obesity, unspecified: Secondary | ICD-10-CM | POA: Diagnosis present

## 2023-08-23 DIAGNOSIS — I959 Hypotension, unspecified: Secondary | ICD-10-CM | POA: Diagnosis not present

## 2023-08-23 DIAGNOSIS — Z8249 Family history of ischemic heart disease and other diseases of the circulatory system: Secondary | ICD-10-CM | POA: Diagnosis not present

## 2023-08-23 DIAGNOSIS — I110 Hypertensive heart disease with heart failure: Secondary | ICD-10-CM | POA: Diagnosis present

## 2023-08-23 LAB — CBC
HCT: 44.8 % (ref 39.0–52.0)
Hemoglobin: 14.9 g/dL (ref 13.0–17.0)
MCH: 29.4 pg (ref 26.0–34.0)
MCHC: 33.3 g/dL (ref 30.0–36.0)
MCV: 88.5 fL (ref 80.0–100.0)
Platelets: 194 10*3/uL (ref 150–400)
RBC: 5.06 MIL/uL (ref 4.22–5.81)
RDW: 18.3 % — ABNORMAL HIGH (ref 11.5–15.5)
WBC: 10 10*3/uL (ref 4.0–10.5)
nRBC: 0 % (ref 0.0–0.2)

## 2023-08-23 LAB — BASIC METABOLIC PANEL
Anion gap: 9 (ref 5–15)
BUN: 29 mg/dL — ABNORMAL HIGH (ref 6–20)
CO2: 32 mmol/L (ref 22–32)
Calcium: 8.7 mg/dL — ABNORMAL LOW (ref 8.9–10.3)
Chloride: 96 mmol/L — ABNORMAL LOW (ref 98–111)
Creatinine, Ser: 1.12 mg/dL (ref 0.61–1.24)
GFR, Estimated: >60 mL/min (ref >60)
Glucose, Bld: 146 mg/dL — ABNORMAL HIGH (ref 70–99)
Potassium: 4.7 mmol/L (ref 3.5–5.1)
Sodium: 137 mmol/L (ref 135–145)

## 2023-08-23 MED ORDER — IPRATROPIUM-ALBUTEROL 0.5-2.5 (3) MG/3ML IN SOLN
3.0000 mL | Freq: Four times a day (QID) | RESPIRATORY_TRACT | Status: DC
  Administered 2023-08-23 – 2023-08-24 (×4): 3 mL via RESPIRATORY_TRACT
  Filled 2023-08-23 (×4): qty 3

## 2023-08-23 MED ORDER — IPRATROPIUM-ALBUTEROL 0.5-2.5 (3) MG/3ML IN SOLN
3.0000 mL | RESPIRATORY_TRACT | Status: DC | PRN
  Administered 2023-08-23 – 2023-08-24 (×2): 3 mL via RESPIRATORY_TRACT
  Filled 2023-08-23 (×2): qty 3

## 2023-08-23 MED ORDER — GUAIFENESIN-DM 100-10 MG/5ML PO SYRP
5.0000 mL | ORAL_SOLUTION | ORAL | Status: DC | PRN
  Administered 2023-08-23 – 2023-08-31 (×12): 5 mL via ORAL
  Filled 2023-08-23 (×13): qty 10

## 2023-08-23 MED ORDER — IPRATROPIUM-ALBUTEROL 0.5-2.5 (3) MG/3ML IN SOLN
3.0000 mL | RESPIRATORY_TRACT | Status: DC
  Administered 2023-08-23: 3 mL via RESPIRATORY_TRACT
  Filled 2023-08-23: qty 3

## 2023-08-23 MED ORDER — ARFORMOTEROL TARTRATE 15 MCG/2ML IN NEBU
15.0000 ug | INHALATION_SOLUTION | Freq: Two times a day (BID) | RESPIRATORY_TRACT | Status: DC
  Administered 2023-08-23 – 2023-08-29 (×12): 15 ug via RESPIRATORY_TRACT
  Filled 2023-08-23 (×12): qty 2

## 2023-08-23 MED ORDER — MENTHOL 3 MG MT LOZG
1.0000 | LOZENGE | OROMUCOSAL | Status: DC | PRN
  Filled 2023-08-23: qty 9

## 2023-08-23 NOTE — ED Notes (Signed)
Pt sleeping.

## 2023-08-23 NOTE — Progress Notes (Signed)
PROGRESS NOTE    Kenneth Benson:[id number] DOB: 1961/09/10 DOA: 08/22/2023 PCP: Pcp, No  [id number]/[id number]-[id number]  LOS: 0 days   Brief hospital course:   Assessment & Plan: Kenneth Benson is a 62 y.o. 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   Kenneth Benson states that over the last few days, he has been experiencing gradually worsening shortness of breath and feeling that his lungs are tight.  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 --CXR no acute finding. - S/p Solu-Medrol 125 mg once Plan: --cont steroid as prednisone 40 mg daily to complete a 5-day course --cont azithromycin, d/c ceftriaxone --increase DuoNeb to q4h while awake - Continue home bronchodilators  Paroxysmal atrial fibrillation with RVR (HCC) Likely secondary to COPD exacerbation.  Per chart review, he frequently has RVR during COPD exacerbations. --s/p IV dilt 15 mg f/b dilt gtt Plan: --cont dilt gtt - Continue home metoprolol - Continue home Eliquis  Chronic hypoxic respiratory failure (HCC) Currently maintain oxygen saturation on home 2 L.  - Continue home 2 L of supplemental oxygen  Chronic combined systolic and diastolic heart failure (HCC) Patient is euvolemic on examination, and he states he has been strictly compliant with his home regimen. Plan: - Continue home Jardiance --hold home torsemide and spironolactone due to soft BP  HTN (hypertension), currently not active - Continue home metoprolol --hold home  torsemide and spironolactone due to soft BP   DVT prophylaxis: [id number] Code Status: Full code  Family Communication:  Level of care: Telemetry Cardiac Dispo:   The patient is from: home Anticipated d/c is to: home Anticipated d/c date is: 2-3 days   Subjective and Interval History:  Pt had a lot of cough.  Reported nebs helped.   Objective: Vitals:   08/23/23 1200 08/23/23 1220 08/23/23 1300 08/23/23 1720  BP:    116/83  Pulse:      Resp: 20 20  20   Temp:    98.1 F (36.7 C)  TempSrc:    Oral  SpO2:   94% 95%  Weight:      Height:        Intake/Output Summary (Last 24 hours) at 08/23/2023 1820 Last data filed at 08/23/2023 1300 Gross per 24 hour  Intake 730 ml  Output --  Net 730 ml   Filed Weights   08/22/23 1046  Weight: 122.5 kg    Examination:   Constitutional: NAD, AAOx3 HEENT: conjunctivae and lids normal, EOMI CV: No cyanosis.   RESP: cough with audible wheezing Neuro: II - XII grossly intact.   Psych: Normal mood and affect.  Appropriate judgement and reason   Data Reviewed: I have personally reviewed labs and imaging studies  Time spent: 50 minutes  Darlin Priestly, MD [facility] If 7PM-7AM, please contact night-coverage 08/23/2023, 6:20 PM

## 2023-08-23 NOTE — Plan of Care (Signed)
  Problem: Education: Goal: Knowledge of disease or condition will improve Outcome: Progressing   Problem: Respiratory: Goal: Levels of oxygenation will improve Outcome: Progressing   Problem: Clinical Measurements: Goal: Will remain free from infection Outcome: Progressing   Problem: Clinical Measurements: Goal: Respiratory complications will improve Outcome: Progressing   Problem: Clinical Measurements: Goal: Cardiovascular complication will be avoided Outcome: Progressing   Problem: Pain Managment: Goal: General experience of comfort will improve Outcome: Progressing   Problem: Safety: Goal: Ability to remain free from injury will improve Outcome: Progressing

## 2023-08-23 NOTE — ED Notes (Signed)
Po fluids, urinal provided. Call bell at left side. Pt denies further needs.

## 2023-08-23 NOTE — ED Notes (Signed)
Report from gracie, rn.

## 2023-08-23 NOTE — ED Notes (Signed)
Report to hunter, rn.  

## 2023-08-24 DIAGNOSIS — J441 Chronic obstructive pulmonary disease with (acute) exacerbation: Secondary | ICD-10-CM | POA: Diagnosis not present

## 2023-08-24 LAB — RESPIRATORY PANEL BY PCR

## 2023-08-24 MED ORDER — LEVALBUTEROL HCL 1.25 MG/0.5ML IN NEBU
1.2500 mg | INHALATION_SOLUTION | Freq: Four times a day (QID) | RESPIRATORY_TRACT | Status: DC
  Administered 2023-08-24 – 2023-08-28 (×16): 1.25 mg via RESPIRATORY_TRACT
  Filled 2023-08-24 (×16): qty 0.5

## 2023-08-24 MED ORDER — IPRATROPIUM BROMIDE 0.02 % IN SOLN
0.5000 mg | Freq: Four times a day (QID) | RESPIRATORY_TRACT | Status: DC
  Administered 2023-08-24 – 2023-08-28 (×16): 0.5 mg via RESPIRATORY_TRACT
  Filled 2023-08-24 (×16): qty 2.5

## 2023-08-24 NOTE — Progress Notes (Signed)
PROGRESS NOTE    Kenneth Benson  [id number] DOB: September 12, 1961 DOA: 08/22/2023 PCP: Pcp, No  259A/259A-AA  LOS: 1 day   Brief hospital course:   Assessment & Plan: Kenneth Benson is a 62 y.o. 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   Kenneth Benson states that over the last few days, he has been experiencing gradually worsening shortness of breath and feeling that his lungs are tight.  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 --CXR no acute finding.  Covid and RVP neg. - S/p Solu-Medrol 125 mg once Plan: --cont steroid as prednisone 40 mg daily to complete a 5-day course --cont azithromycin --xopenex and ipratropium nebs scheduled - Continue home bronchodilators  Paroxysmal atrial fibrillation with RVR (HCC) Likely secondary to COPD exacerbation.  Per chart review, he frequently has RVR during COPD exacerbations. --s/p IV dilt 15 mg f/b dilt gtt.  Off dilt gtt this morning. Plan: --cont home metop - Continue home Eliquis  Chronic hypoxic respiratory failure (HCC) on 2L O2 at baseline --Continue supplemental O2 to keep sats between 88-92%, wean as tolerated  Chronic combined systolic and diastolic heart failure (HCC) Patient is euvolemic on examination, and he states he has been strictly compliant with his home regimen. Plan: - Continue home Jardiance --hold home torsemide and spironolactone due to soft BP  HTN (hypertension), currently not active --cont home Lopressor --hold home  torsemide and spironolactone due to soft BP   DVT prophylaxis: [id number] Code Status: Full code  Family Communication:  Level of care: Telemetry Cardiac Dispo:   The patient is from: home Anticipated d/c is to: home Anticipated d/c date is: 2-3 days   Subjective and Interval History:  Pt reported feeling better, but developed congestion today.   Objective: Vitals:   08/24/23 1252 08/24/23 1602 08/24/23 1630 08/24/23 1848  BP: 107/74 120/87 106/83 110/82  Pulse:  100 97   Resp:  20 20 20   Temp:  98.3 F (36.8 C) 98.6 F (37 C) 97.6 F (36.4 C)  TempSrc:  Oral Oral Oral  SpO2:  92% 93% 92%  Weight:      Height:        Intake/Output Summary (Last 24 hours) at 08/24/2023 1906 Last data filed at 08/24/2023 1851 Gross per 24 hour  Intake 1215.06 ml  Output 2150 ml  Net -934.94 ml   Filed Weights   08/22/23 1046  Weight: 122.5 kg    Examination:   Constitutional: NAD, AAOx3 HEENT: conjunctivae and lids normal, EOMI CV: No cyanosis.   RESP: normal respiratory effort, receiving neb Neuro: II - XII grossly intact.   Psych: Normal mood and affect.  Appropriate judgement and reason   Data Reviewed: I have personally reviewed labs and imaging studies  Time spent: 35 minutes  Darlin Priestly, MD [facility] If 7PM-7AM, please contact night-coverage 08/24/2023, 7:06 PM

## 2023-08-24 NOTE — Plan of Care (Signed)
  Problem: Education: Goal: Knowledge of disease or condition will improve Outcome: Progressing   Problem: Respiratory: Goal: Ability to maintain a clear airway will improve Outcome: Progressing   Problem: Clinical Measurements: Goal: Cardiovascular complication will be avoided Outcome: Progressing   Problem: Activity: Goal: Risk for activity intolerance will decrease Outcome: Progressing   Problem: Elimination: Goal: Will not experience complications related to bowel motility Outcome: Progressing   Problem: Elimination: Goal: Will not experience complications related to urinary retention Outcome: Progressing   Problem: Pain Managment: Goal: General experience of comfort will improve Outcome: Progressing

## 2023-08-24 NOTE — Progress Notes (Signed)
   08/24/23 2018  Assess: MEWS Score  Temp 98 F (36.7 C)  BP 104/85  MAP (mmHg) 93  Pulse Rate (!) 105  ECG Heart Rate (!) 112  Resp 20  SpO2 93 %  O2 Device Nasal Cannula  O2 Flow Rate (L/min) 6 L/min  Assess: MEWS Score  MEWS Temp 0  MEWS Systolic 0  MEWS Pulse 2  MEWS RR 0  MEWS LOC 0  MEWS Score 2  MEWS Score Color Yellow  Assess: if the MEWS score is Yellow or Red  Were vital signs accurate and taken at a resting state? Yes  Does the patient meet 2 or more of the SIRS criteria? No  MEWS guidelines implemented  Yes, yellow  Treat  MEWS Interventions Considered administering scheduled or prn medications/treatments as ordered  Take Vital Signs  Increase Vital Sign Frequency  Yellow: Q2hr x1, continue Q4hrs until patient remains green for 12hrs  Escalate  MEWS: Escalate Yellow: Discuss with charge nurse and consider notifying provider and/or RRT  Notify: Charge Nurse/RN  Name of Charge Nurse/RN Notified Gwendolyn Fill, RN  Assess: SIRS CRITERIA  SIRS Temperature  0  SIRS Pulse 1  SIRS Respirations  0  SIRS WBC 0  SIRS Score Sum  1

## 2023-08-25 DIAGNOSIS — J441 Chronic obstructive pulmonary disease with (acute) exacerbation: Secondary | ICD-10-CM | POA: Diagnosis not present

## 2023-08-25 MED ORDER — MIDODRINE HCL 5 MG PO TABS
10.0000 mg | ORAL_TABLET | Freq: Three times a day (TID) | ORAL | Status: DC
  Administered 2023-08-25 – 2023-08-31 (×18): 10 mg via ORAL
  Filled 2023-08-25 (×18): qty 2

## 2023-08-25 MED ORDER — BUTALBITAL-APAP-CAFFEINE 50-325-40 MG PO TABS: 2.0000 | ORAL_TABLET | Freq: Two times a day (BID) | ORAL | Status: DC | PRN

## 2023-08-25 MED ORDER — METOPROLOL TARTRATE 50 MG PO TABS
50.0000 mg | ORAL_TABLET | Freq: Two times a day (BID) | ORAL | Status: DC
  Administered 2023-08-25 – 2023-09-01 (×13): 50 mg via ORAL
  Filled 2023-08-25 (×14): qty 1

## 2023-08-25 NOTE — Progress Notes (Signed)
PROGRESS NOTE    Kenneth Benson  [id number] DOB: 1961-07-27 DOA: 08/22/2023 PCP: Pcp, No  259A/259A-AA  LOS: 2 days   Brief hospital course:   Assessment & Plan: Kenneth Benson is a 62 y.o. 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   Kenneth Benson states that over the last few days, he has been experiencing gradually worsening shortness of breath and feeling that his lungs are tight.  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 --CXR no acute finding.  Covid and RVP neg. - S/p Solu-Medrol 125 mg once Plan: --cont prednisone 40 mg daily --cont azithromycin --xopenex and ipratropium nebs scheduled - Continue home bronchodilators  Paroxysmal atrial fibrillation with RVR (HCC) Likely secondary to COPD exacerbation.  Per chart review, he frequently has RVR during COPD exacerbations. --s/p IV dilt 15 mg f/b dilt gtt.   Plan: --dilt gtt if HR consistently >110's --increase home lopressor - Continue home Eliquis  Chronic hypoxic respiratory failure (HCC) on 2L O2 at baseline --Continue supplemental O2 to keep sats between 88-92%  Chronic combined systolic and diastolic heart failure (HCC) Patient is euvolemic on examination, and he states he has been strictly compliant with his home regimen. Plan: --cont home Jardiance --hold home torsemide and spironolactone due to soft BP  HTN (hypertension), currently not active --hold home torsemide and spironolactone due to soft  BP  Hypotension --start midodrine 10 mg TID   DVT prophylaxis: [id number] Code Status: Full code  Family Communication:  Level of care: Telemetry Cardiac Dispo:   The patient is from: home Anticipated d/c is to: home Anticipated d/c date is: 2-3 days   Subjective and Interval History:  Pt said he didn't feel better.  Had headache.  Still coughing.   Objective: Vitals:   08/25/23 1100 08/25/23 1325 08/25/23 1610 08/25/23 1829  BP: 108/87  105/87 99/79  Pulse: 98  (!) 117 (!) 104  Resp: (!) 21  20 20   Temp: 98 F (36.7 C)  98.1 F (36.7 C)   TempSrc:   Oral   SpO2: 96% 95% 92% 93%  Weight:      Height:        Intake/Output Summary (Last 24 hours) at 08/25/2023 1830 Last data filed at 08/25/2023 1651 Gross per 24 hour  Intake 1614.23 ml  Output 2950 ml  Net -1335.77 ml   Filed Weights   08/22/23 1046  Weight: 122.5 kg    Examination:   Constitutional: NAD, AAOx3 HEENT: conjunctivae and lids normal, EOMI CV: No cyanosis.   RESP: normal respiratory effort, wheezing, on 5L [place] but prongs were out of nostrils Extremities: No effusions, edema in BLE SKIN: warm, dry Neuro: II - XII grossly intact.   Psych: Normal mood and affect.  Appropriate judgement and reason   Data Reviewed: I have personally reviewed labs and imaging studies  Time spent: 50 minutes  Darlin Priestly, MD [facility] If 7PM-7AM, please contact night-coverage 08/25/2023, 6:30 PM

## 2023-08-25 NOTE — Plan of Care (Signed)
  Problem: Education: Goal: Knowledge of disease or condition will improve Outcome: Progressing   Problem: Activity: Goal: Ability to tolerate increased activity will improve Outcome: Progressing   Problem: Respiratory: Goal: Ability to maintain a clear airway will improve Outcome: Progressing   Problem: Activity: Goal: Ability to tolerate increased activity will improve Outcome: Progressing   Problem: Clinical Measurements: Goal: Cardiovascular complication will be avoided Outcome: Progressing   Problem: Pain Managment: Goal: General experience of comfort will improve Outcome: Progressing

## 2023-08-26 DIAGNOSIS — J441 Chronic obstructive pulmonary disease with (acute) exacerbation: Secondary | ICD-10-CM | POA: Diagnosis not present

## 2023-08-26 MED ORDER — DILTIAZEM HCL 30 MG PO TABS
30.0000 mg | ORAL_TABLET | Freq: Four times a day (QID) | ORAL | Status: DC
  Administered 2023-08-26 – 2023-08-28 (×8): 30 mg via ORAL
  Filled 2023-08-26 (×8): qty 1

## 2023-08-26 NOTE — Progress Notes (Signed)
Kenneth Benson Friend of patient would like to call on this number for emergency # [phone]. Patient made aware of new contact number at this time.

## 2023-08-26 NOTE — Progress Notes (Signed)
PROGRESS NOTE    Kenneth Benson  [id number] DOB: 02-28-1961 DOA: 08/22/2023 PCP: Pcp, No  259A/259A-AA  LOS: 3 days   Brief hospital course:   Assessment & Plan: Kenneth Benson is a 62 y.o. 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   [name] states that over the last few days, he has been experiencing gradually worsening shortness of breath and feeling that his lungs are tight.  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 --CXR no acute finding.  Covid and RVP neg. - S/p Solu-Medrol 125 mg once Plan: --cont prednisone 40 mg daily --cont azithromycin --xopenex and ipratropium nebs scheduled - Continue home bronchodilators  Paroxysmal atrial fibrillation with RVR (HCC) Likely secondary to COPD exacerbation.  Per chart review, he frequently has RVR during COPD exacerbations. --s/p IV dilt 15 mg f/b dilt gtt.   Plan: --taper off dilt gtt --start oral dilt 30 q6h --cont Lopressor at increased 50 mg BID - Continue home Eliquis  Acute on Chronic hypoxic respiratory failure (HCC) on 2L O2 at baseline --increased O2 requirement to 4-5L. --Continue supplemental O2 to keep sats between 88-92%, wean as tolerated  Chronic combined systolic and diastolic heart failure (HCC) Patient is euvolemic on examination, and he states he has been strictly compliant with his home regimen. Plan: --cont home Jardiance --hold home torsemide and spironolactone due to soft BP  HTN (hypertension),  currently not active --hold home torsemide and spironolactone due to soft BP  Hypotension --cont midodrine 10 mg TID   DVT prophylaxis: [id number] Code Status: Full code  Family Communication:  Level of care: Telemetry Cardiac Dispo:   The patient is from: home Anticipated d/c is to: home Anticipated d/c date is: 1-2 days   Subjective and Interval History:  Pt reported feeling better today.   Objective: Vitals:   08/26/23 0844 08/26/23 1318 08/26/23 1553 08/26/23 1612  BP: 110/88  98/79 114/86  Pulse:    (!) 104  Resp:    19  Temp: 98 F (36.7 C) 98 F (36.7 C) 98.5 F (36.9 C)   TempSrc: Oral Oral Oral   SpO2:    (!) 89%  Weight:      Height:        Intake/Output Summary (Last 24 hours) at 08/26/2023 1630 Last data filed at 08/26/2023 1556 Gross per 24 hour  Intake 519 ml  Output 3355 ml  Net -2836 ml   Filed Weights   08/22/23 1046  Weight: 122.5 kg    Examination:   Constitutional: NAD, AAOx3 HEENT: conjunctivae and lids normal, EOMI CV: No cyanosis.   RESP: normal respiratory effort, on 4L Extremities: mild erythema and edema over left forearm where IV infiltrated SKIN: warm, dry Neuro: II - XII grossly intact.   Psych: Normal mood and affect.  Appropriate judgement and reason   Data Reviewed: I have personally reviewed labs and imaging studies  Time spent: 35 minutes  Darlin Priestly, MD [facility] If 7PM-7AM, please contact night-coverage 08/26/2023, 4:30 PM

## 2023-08-27 DIAGNOSIS — J441 Chronic obstructive pulmonary disease with (acute) exacerbation: Secondary | ICD-10-CM | POA: Diagnosis not present

## 2023-08-27 LAB — BASIC METABOLIC PANEL
Anion gap: 5 (ref 5–15)
BUN: 24 mg/dL — ABNORMAL HIGH (ref 6–20)
CO2: 39 mmol/L — ABNORMAL HIGH (ref 22–32)
Calcium: 8.7 mg/dL — ABNORMAL LOW (ref 8.9–10.3)
Chloride: 98 mmol/L (ref 98–111)
Creatinine, Ser: 0.78 mg/dL (ref 0.61–1.24)
GFR, Estimated: >60 mL/min (ref >60)
Glucose, Bld: 91 mg/dL (ref 70–99)
Potassium: 4.2 mmol/L (ref 3.5–5.1)
Sodium: 142 mmol/L (ref 135–145)

## 2023-08-27 LAB — MAGNESIUM: Magnesium: 2.4 mg/dL (ref 1.7–2.4)

## 2023-08-27 LAB — CBC
HCT: 44.9 % (ref 39.0–52.0)
Hemoglobin: 14.4 g/dL (ref 13.0–17.0)
MCH: 29.6 pg (ref 26.0–34.0)
MCHC: 32.1 g/dL (ref 30.0–36.0)
MCV: 92.2 fL (ref 80.0–100.0)
Platelets: 199 10*3/uL (ref 150–400)
RBC: 4.87 MIL/uL (ref 4.22–5.81)
RDW: 18.7 % — ABNORMAL HIGH (ref 11.5–15.5)
WBC: 8.1 10*3/uL (ref 4.0–10.5)
nRBC: 0 % (ref 0.0–0.2)

## 2023-08-27 NOTE — Plan of Care (Signed)
  Problem: Activity: Goal: Ability to tolerate increased activity will improve Outcome: Progressing Goal: Will verbalize the importance of balancing activity with adequate rest periods Outcome: Progressing   Problem: Respiratory: Goal: Ability to maintain a clear airway will improve Outcome: Progressing Goal: Levels of oxygenation will improve Outcome: Progressing Goal: Ability to maintain adequate ventilation will improve Outcome: Progressing   

## 2023-08-27 NOTE — Progress Notes (Signed)
PROGRESS NOTE    Kenneth Benson  [id number] DOB: January 25, 1961 DOA: 08/22/2023 PCP: Pcp, No  259A/259A-AA  LOS: 4 days   Brief hospital course:   Assessment & Plan: Kenneth Benson is a 62 y.o. 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   Kenneth Benson states that over the last few days, he has been experiencing gradually worsening shortness of breath and feeling that his lungs are tight.  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 --CXR no acute finding.  Covid and RVP neg. - S/p Solu-Medrol 125 mg once Plan: --cont prednisone 40 mg daily --cont azithromycin --xopenex and ipratropium nebs scheduled - Continue home bronchodilators  Paroxysmal atrial fibrillation with RVR (HCC) Likely secondary to COPD exacerbation.  Per chart review, he frequently has RVR during COPD exacerbations. --s/p IV dilt 15 mg f/b dilt gtt, now tapered off. Plan: --cont oral dilt 30 q6h --cont Lopressor at increased 50 mg BID - Continue home Eliquis  Acute on Chronic hypoxic respiratory failure (HCC) on 2L O2 at baseline --increased O2 requirement to 4-5L, now back on home 2L. --Continue supplemental O2 to keep sats between 88-92%  Chronic combined systolic and diastolic heart failure (HCC) Patient is euvolemic on examination, and he states he has been strictly compliant with his home regimen. Plan: --cont home Jardiance --hold home torsemide and spironolactone due to soft BP  HTN (hypertension), currently not  active --hold home torsemide and spironolactone due to soft BP  Hypotension --cont midodrine 10 TID   DVT prophylaxis: [id number] Code Status: Full code  Family Communication:  Level of care: Telemetry Cardiac Dispo:   The patient is from: home Anticipated d/c is to: home Anticipated d/c date is: 1-2 days   Subjective and Interval History:  Pt reported breathing better today.     Objective: Vitals:   08/27/23 0621 08/27/23 0800 08/27/23 0824 08/27/23 1118  BP: 109/89 101/84 101/84 103/73  Pulse:  94 (!) 101 94  Resp:  20 (!) 26 20  Temp:  97.8 F (36.6 C) 97.7 F (36.5 C) 98.2 F (36.8 C)  TempSrc:  Oral Oral Oral  SpO2:  97% 93% 93%  Weight:      Height:        Intake/Output Summary (Last 24 hours) at 08/27/2023 1540 Last data filed at 08/27/2023 1538 Gross per 24 hour  Intake 120 ml  Output 2375 ml  Net -2255 ml   Filed Weights   08/22/23 1046  Weight: 122.5 kg    Examination:   Constitutional: NAD, AAOx3 HEENT: conjunctivae and lids normal, EOMI CV: No cyanosis.   RESP: normal respiratory effort, on 2L Neuro: II - XII grossly intact.   Psych: Normal mood and affect.  Appropriate judgement and reason   Data Reviewed: I have personally reviewed labs and imaging studies  Time spent: 35 minutes  Darlin Priestly, MD [facility] If 7PM-7AM, please contact night-coverage 08/27/2023, 3:40 PM

## 2023-08-28 DIAGNOSIS — J441 Chronic obstructive pulmonary disease with (acute) exacerbation: Secondary | ICD-10-CM | POA: Diagnosis not present

## 2023-08-28 LAB — BASIC METABOLIC PANEL
Anion gap: 8 (ref 5–15)
BUN: 24 mg/dL — ABNORMAL HIGH (ref 6–20)
CO2: 35 mmol/L — ABNORMAL HIGH (ref 22–32)
Calcium: 8.5 mg/dL — ABNORMAL LOW (ref 8.9–10.3)
Chloride: 96 mmol/L — ABNORMAL LOW (ref 98–111)
Creatinine, Ser: 0.76 mg/dL (ref 0.61–1.24)
GFR, Estimated: >60 mL/min (ref >60)
Glucose, Bld: 94 mg/dL (ref 70–99)
Potassium: 4.2 mmol/L (ref 3.5–5.1)
Sodium: 139 mmol/L (ref 135–145)

## 2023-08-28 LAB — CBC
HCT: 44.3 % (ref 39.0–52.0)
Hemoglobin: 14.4 g/dL (ref 13.0–17.0)
MCH: 29.4 pg (ref 26.0–34.0)
MCHC: 32.5 g/dL (ref 30.0–36.0)
MCV: 90.6 fL (ref 80.0–100.0)
Platelets: 217 10*3/uL (ref 150–400)
RBC: 4.89 MIL/uL (ref 4.22–5.81)
RDW: 18.9 % — ABNORMAL HIGH (ref 11.5–15.5)
WBC: 8 10*3/uL (ref 4.0–10.5)
nRBC: 0 % (ref 0.0–0.2)

## 2023-08-28 LAB — MAGNESIUM: Magnesium: 2.2 mg/dL (ref 1.7–2.4)

## 2023-08-28 MED ORDER — IPRATROPIUM-ALBUTEROL 0.5-2.5 (3) MG/3ML IN SOLN
3.0000 mL | Freq: Three times a day (TID) | RESPIRATORY_TRACT | Status: DC
  Administered 2023-08-28 – 2023-08-29 (×4): 3 mL via RESPIRATORY_TRACT
  Filled 2023-08-28 (×3): qty 3

## 2023-08-28 MED ORDER — DILTIAZEM HCL ER COATED BEADS 120 MG PO CP24
120.0000 mg | ORAL_CAPSULE | Freq: Every day | ORAL | Status: DC
  Administered 2023-08-28: 120 mg via ORAL
  Filled 2023-08-28: qty 1

## 2023-08-28 MED ORDER — DILTIAZEM HCL 30 MG PO TABS
60.0000 mg | ORAL_TABLET | Freq: Four times a day (QID) | ORAL | Status: DC
  Administered 2023-08-28 – 2023-08-30 (×7): 60 mg via ORAL
  Filled 2023-08-28 (×7): qty 2

## 2023-08-28 MED ORDER — DILTIAZEM HCL-DEXTROSE 125-5 MG/125ML-% IV SOLN (PREMIX)
5.0000 mg/h | INTRAVENOUS | Status: DC
  Administered 2023-08-28: 5 mg/h via INTRAVENOUS
  Filled 2023-08-28: qty 125

## 2023-08-28 MED ORDER — ALBUTEROL SULFATE (2.5 MG/3ML) 0.083% IN NEBU: 2.5000 mg | INHALATION_SOLUTION | RESPIRATORY_TRACT | Status: DC | PRN

## 2023-08-28 NOTE — Plan of Care (Signed)
  Problem: Education: Goal: Knowledge of disease or condition will improve Outcome: Progressing Goal: Knowledge of the prescribed therapeutic regimen will improve Outcome: Progressing Goal: Individualized Educational Video(s) Outcome: Progressing   Problem: Activity: Goal: Ability to tolerate increased activity will improve Outcome: Progressing Goal: Will verbalize the importance of balancing activity with adequate rest periods Outcome: Progressing   Problem: Respiratory: Goal: Ability to maintain a clear airway will improve Outcome: Progressing Goal: Levels of oxygenation will improve Outcome: Progressing Goal: Ability to maintain adequate ventilation will improve Outcome: Progressing   Problem: Education: Goal: Knowledge of disease or condition will improve Outcome: Progressing Goal: Understanding of medication regimen will improve Outcome: Progressing Goal: Individualized Educational Video(s) Outcome: Progressing   Problem: Activity: Goal: Ability to tolerate increased activity will improve Outcome: Progressing   Problem: Cardiac: Goal: Ability to achieve and maintain adequate cardiopulmonary perfusion will improve Outcome: Progressing   Problem: Health Behavior/Discharge Planning: Goal: Ability to safely manage health-related needs after discharge will improve Outcome: Progressing   Problem: Education: Goal: Knowledge of General Education information will improve Description: Including pain rating scale, medication(s)/side effects and non-pharmacologic comfort measures Outcome: Progressing   Problem: Health Behavior/Discharge Planning: Goal: Ability to manage health-related needs will improve Outcome: Progressing   Problem: Clinical Measurements: Goal: Ability to maintain clinical measurements within normal limits will improve Outcome: Progressing Goal: Will remain free from infection Outcome: Progressing Goal: Diagnostic test results will improve Outcome:  Progressing Goal: Respiratory complications will improve Outcome: Progressing Goal: Cardiovascular complication will be avoided Outcome: Progressing   Problem: Activity: Goal: Risk for activity intolerance will decrease Outcome: Progressing   Problem: Nutrition: Goal: Adequate nutrition will be maintained Outcome: Progressing   Problem: Coping: Goal: Level of anxiety will decrease Outcome: Progressing   Problem: Elimination: Goal: Will not experience complications related to bowel motility Outcome: Progressing Goal: Will not experience complications related to urinary retention Outcome: Progressing   Problem: Pain Managment: Goal: General experience of comfort will improve Outcome: Progressing   Problem: Safety: Goal: Ability to remain free from injury will improve Outcome: Progressing   Problem: Skin Integrity: Goal: Risk for impaired skin integrity will decrease Outcome: Progressing   

## 2023-08-28 NOTE — Progress Notes (Signed)
CROSS COVER NOTE  NAME: Kenneth Benson MRN: [id number] DOB : 27-Aug-1961    Concern as stated by nurse / staff   Pts dilt drip was d/c this morning. Scheduled po dilt and metoprolol has been given but his HR is sustaining in the 120-130's.   7 mins      Pertinent findings on chart review: last progress note reviewed    08/28/2023    1:00 AM 08/28/2023   12:30 AM 08/28/2023   12:00 AM  Vitals with BMI  Systolic 113  110  Diastolic 79  82  Pulse 125 101 47     Assessment and  Interventions   Assessment:  A fib with RVR  Plan: Will restart dilt infusion at low rate X X

## 2023-08-28 NOTE — Progress Notes (Signed)
Changed pulse oximetry from right hand to left hand because couldn't get good pleth on right side

## 2023-08-28 NOTE — Progress Notes (Signed)
Patient refusing metoprolol.  Dr. Fran Lowes and Cardiology notified.

## 2023-08-28 NOTE — Progress Notes (Signed)
PROGRESS NOTE    Kenneth Benson  [id number] DOB: 1961/03/11 DOA: 08/22/2023 PCP: Pcp, No  259A/259A-AA  LOS: 5 days   Brief hospital course:   Assessment & Plan: Kenneth Benson is a 62 y.o. 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   Kenneth Benson states that over the last few days, he has been experiencing gradually worsening shortness of breath and feeling that his lungs are tight.  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 --CXR no acute finding.  Covid and RVP neg. - S/p Solu-Medrol 125 mg once f/b prednisone 40 mg daily for 5 days. --completed 5 days of azithromycin Plan: --xopenex and ipratropium nebs scheduled - Continue home bronchodilators  Paroxysmal atrial fibrillation with RVR (HCC) Likely secondary to COPD exacerbation.  Per chart review, he frequently has RVR during COPD exacerbations. --s/p IV dilt 15 mg f/b dilt gtt, unable to taper off.  Rate control agents limited by low BP. Plan: --cardio consult today --dilt gtt d/c'ed by cardio --increase oral dilt to 60 mg q6h, per cardio --cont Lopressor at increased 50 mg BID - Continue home Eliquis  Acute on Chronic hypoxic respiratory failure (HCC) on 2L O2 at baseline --increased O2 requirement to 4-5L, now back on home 2L. --Continue supplemental O2 to keep sats between 88-92%  Chronic combined systolic and diastolic heart failure (HCC) Patient is euvolemic on examination, and he states he has been strictly compliant with  his home regimen. Plan: --cont home Jardiance --hold home torsemide and spironolactone due to soft BP  HTN (hypertension), currently not active --hold home torsemide and spironolactone due to soft BP  Hypotension --cont midodrine 10 mg TID   DVT prophylaxis: [id number] Code Status: Full code  Family Communication:  Level of care: Telemetry Cardiac Dispo:   The patient is from: home Anticipated d/c is to: home Anticipated d/c date is: 1-2 days   Subjective and Interval History:  Pt reported feeling ok respiratory-wise.  Still in RVR.   Objective: Vitals:   08/28/23 1300 08/28/23 1405 08/28/23 1500 08/28/23 1700  BP:      Pulse: 100 99 (!) 115 (!) 102  Resp:  20    Temp:   98.3 F (36.8 C)   TempSrc:   Oral   SpO2:  91%    Weight:      Height:        Intake/Output Summary (Last 24 hours) at 08/28/2023 1904 Last data filed at 08/28/2023 1500 Gross per 24 hour  Intake 240 ml  Output 2300 ml  Net -2060 ml   Filed Weights   08/22/23 1046 08/28/23 0525  Weight: 122.5 kg 130.9 kg    Examination:   Constitutional: NAD, AAOx3 HEENT: conjunctivae and lids normal, EOMI CV: No cyanosis.   RESP: normal respiratory effort, on 2L Extremities: No effusions, edema in BLE SKIN: warm, dry Neuro: II - XII grossly intact.   Psych: Normal mood and affect.  Appropriate judgement and reason   Data Reviewed: I have personally reviewed labs  and imaging studies  Time spent: 35 minutes  Darlin Priestly, MD [facility] If 7PM-7AM, please contact night-coverage 08/28/2023, 7:04 PM

## 2023-08-28 NOTE — Progress Notes (Signed)
MD notified of HR sustaining in the 120's-130's after metoprolol and Diltiazem PO. Dilt drip has been reordered and started. Will continue to monitor the pts HR and BP.

## 2023-08-28 NOTE — Consult Note (Signed)
[facility] CARDIOLOGY CONSULT NOTE       Patient ID: Kenneth Benson MRN: [id number] DOB/AGE: [birth year] 62 y.o.  Admit date: 08/22/2023 Referring Physician Dr. Darlin Priestly Primary Physician none Primary Cardiologist Beatrix Fetters (last seen 02/2022) Reason for Consultation atrial fibrillation RVR  HPI: Kenneth Benson is a 62 y.o. 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 Cardiology was consulted for further evaluation.   Patient reports that over the last few months he has had multiple episodes of COPD exacerbations.  States that on the day of his admission he noticed worsening shortness of breath when he reports he has to "suck air" and decided to come to the ED for evaluation.  Endorses decrease in functional capacity and shortness of breath with minimal exertion.  States that he wears 2 L of oxygen at baseline.  Also reports that he could feel his heart racing when he presented to the ED.  Found to be in atrial fibrillation with RVR on EKG in the ED.  Has a history of atrial fibrillation and often has episodes of RVR when he has COPD exacerbations.  He was not requiring any additional supplemental oxygen than his baseline in the ED.  Lab work in the ED notable for creatinine 0.97, potassium 4.1, sodium 137, procalcitonin less than 0.10, hemoglobin 16.0, WBCs 7.7.  Troponin 17.  He was started on antibiotics and steroids in the ED as well as diltiazem infusion for rate control.  Throughout his admission he has remained in atrial fibrillation with borderline elevated rates.  He has intermittently required IV diltiazem for rate control.  This morning on evaluation he is in atrial fibrillation with rates in the 90-100s.  States that overall his shortness of breath and palpitation symptoms have improved.  He denies any chest pain.  Remains on his home O2 requirement.  No  evidence of volume overload and he denies any issues with lower extremity edema.  Review of systems complete and found to be negative unless listed above    Past Medical History:  Diagnosis Date   A-fib (HCC) 02/06/2022   AKI (acute kidney injury) (HCC) 12/17/2022   Arrhythmia    atrial fibrillation   Asthma    CHF (congestive heart failure) (HCC)    COPD (chronic obstructive pulmonary disease) (HCC)    Hyperkalemia 12/17/2022   Hypokalemia 04/13/2023   Myocardial injury 04/13/2023   Tobacco abuse     History reviewed. No pertinent surgical history.  Medications Prior to Admission  Medication Sig Dispense Refill Last Dose   albuterol (PROVENTIL) (2.5 MG/3ML) 0.083% nebulizer solution Take 3 mLs (2.5 mg total) by nebulization every 6 (six) hours as needed for wheezing or shortness of breath. 360 mL 1 08/21/2023   albuterol (VENTOLIN HFA) 108 (90 Base) MCG/ACT inhaler Inhale 2 puffs into the lungs every 6 (six) hours as needed for wheezing or shortness of breath. 18 g 1 08/21/2023   apixaban (ELIQUIS) 5 MG TABS tablet Take 1 tablet (5 mg total) by mouth 2 (two) times daily. 60 tablet 3 08/22/2023   arformoterol (BROVANA) 15 MCG/2ML NEBU Take 2 mLs (15 mcg total) by nebulization 2 (two) times daily. 120 mL 2 08/22/2023   diltiazem (CARDIZEM CD) 240 MG 24 hr capsule Take 1 capsule (240 mg total) by mouth daily. 30 capsule 3 08/22/2023   JARDIANCE 10 MG TABS tablet Take 1  tablet (10 mg total) by mouth daily. 30 tablet 3 08/22/2023   spironolactone (ALDACTONE) 25 MG tablet Take 0.5 tablets (12.5 mg total) by mouth daily. 30 tablet 3 08/22/2023   torsemide (DEMADEX) 20 MG tablet Take 2 tablets (40 mg total) by mouth daily. 60 tablet 1 08/22/2023   metoprolol tartrate (LOPRESSOR) 50 MG tablet Take 1 tablet (50 mg total) by mouth 2 (two) times daily. (Patient not taking: Reported on 08/22/2023) 60 tablet 3 Not Taking   Social History   Socioeconomic History   Marital status: Single    Spouse name:  Not on file   Number of children: Not on file   Years of education: Not on file   Highest education level: Not on file  Occupational History   Not on file  Tobacco Use   Smoking status: Every Day    Current packs/day: 1.50    Average packs/day: 1.5 packs/day for 27.0 years (40.5 ttl pk-yrs)    Types: Cigarettes   Smokeless tobacco: Not on file   Tobacco comments:    0.5PPD at the most.  trying to quit.  05/11/2023 hfb  Vaping Use   Vaping status: Never Used  Substance and Sexual Activity   Alcohol use: No   Drug use: No   Sexual activity: Not on file  Other Topics Concern   Not on file  Social History Narrative   Not on file   Social Determinants of Health   Financial Resource Strain: Not on file  Food Insecurity: No Food Insecurity (08/22/2023)   Hunger Vital Sign    Worried About Running Out of Food in the Last Year: Never true    Ran Out of Food in the Last Year: Never true  Transportation Needs: No Transportation Needs (08/22/2023)   PRAPARE - Administrator, Civil Service (Medical): No    Lack of Transportation (Non-Medical): No  Physical Activity: Not on file  Stress: Not on file  Social Connections: Not on file  Intimate Partner Violence: Not At Risk (08/22/2023)   Humiliation, Afraid, Rape, and Kick questionnaire    Fear of Current or Ex-Partner: No    Emotionally Abused: No    Physically Abused: No    Sexually Abused: No    Family History  Problem Relation Age of Onset   Heart disease Mother    Atrial fibrillation Father      Vitals:   08/28/23 1100 08/28/23 1200 08/28/23 1300 08/28/23 1405  BP: 110/70 (!) 105/92    Pulse: 98 (!) 106 100 99  Resp: (!) 24 20  20   Temp: 97.9 F (36.6 C)     TempSrc: Oral     SpO2: 93% 98%  91%  Weight:      Height:        PHYSICAL EXAM General: Chronically ill-appearing male, well nourished, in no acute distress nearly flat in hospital bed. HEENT: Normocephalic and atraumatic. Neck: No JVD.  Lungs:  Normal respiratory effort on 2L [place]. Clear bilaterally to auscultation. No wheezes, crackles, rhonchi.  Heart: Irregularly irregular. Normal S1 and S2 without gallops or murmurs.  Abdomen: Non-distended appearing.  Msk: Normal strength and tone for age. Extremities: Warm and well perfused. No clubbing, cyanosis. No edema.  Neuro: Alert and oriented X 3. Psych: Answers questions appropriately.   Labs: Basic Metabolic Panel: Recent Labs    08/27/23 0621 08/28/23 0521  NA 142 139  K 4.2 4.2  CL 98 96*  CO2 39* 35*  GLUCOSE 91  94  BUN 24* 24*  CREATININE 0.78 0.76  CALCIUM 8.7* 8.5*  MG 2.4 2.2   Liver Function Tests: No results for input(s): "AST", "ALT", "ALKPHOS", "BILITOT", "PROT", "ALBUMIN" in the last 72 hours. No results for input(s): "LIPASE", "AMYLASE" in the last 72 hours. CBC: Recent Labs    08/27/23 0621 08/28/23 0521  WBC 8.1 8.0  HGB 14.4 14.4  HCT 44.9 44.3  MCV 92.2 90.6  PLT 199 217   Cardiac Enzymes: No results for input(s): "CKTOTAL", "CKMB", "CKMBINDEX", "TROPONINIHS" in the last 72 hours. BNP: No results for input(s): "BNP" in the last 72 hours. D-Dimer: No results for input(s): "DDIMER" in the last 72 hours. Hemoglobin A1C: No results for input(s): "HGBA1C" in the last 72 hours. Fasting Lipid Panel: No results for input(s): "CHOL", "HDL", "LDLCALC", "TRIG", "CHOLHDL", "LDLDIRECT" in the last 72 hours. Thyroid Function Tests: No results for input(s): "TSH", "T4TOTAL", "T3FREE", "THYROIDAB" in the last 72 hours.  Invalid input(s): "FREET3" Anemia Panel: No results for input(s): "VITAMINB12", "FOLATE", "FERRITIN", "TIBC", "IRON", "RETICCTPCT" in the last 72 hours.   Radiology: [facility], [place] Chest Port 1 View  Result Date: 08/22/2023 CLINICAL DATA:  Shortness of breath for several days. Wheezing. On home oxygen. EXAM: PORTABLE CHEST 1 VIEW COMPARISON:  Radiographs 07/04/2023 and 07/02/2023.  CT 07/02/2023. FINDINGS: 1149 hours. Lordotic positioning. Stable  cardiomegaly and mild bibasilar atelectasis. No edema, confluent airspace disease, pleural effusion or pneumothorax. The bones appear unchanged. Telemetry leads overlie the chest. IMPRESSION: Stable cardiomegaly and mild bibasilar atelectasis. No acute cardiopulmonary process. Electronically Signed   By: Carey Bullocks M.D.   On: 08/22/2023 13:38    ECHO 11/2022:  1. Left ventricular ejection fraction, by estimation, is 45 to 50%. The left ventricle has mildly decreased function. The left ventricle has no regional wall motion abnormalities. The left ventricular internal cavity size was mildly dilated. Left ventricular diastolic parameters were normal.   2. Right ventricular systolic function is normal. The right ventricular  size is normal. There is mildly elevated pulmonary artery systolic  pressure.   3. The mitral valve is normal in structure. Moderate mitral valve  regurgitation. No evidence of mitral stenosis.   4. Tricuspid valve regurgitation is moderate.   5. The aortic valve is normal in structure. Aortic valve regurgitation is  not visualized. No aortic stenosis is present.   6. The inferior vena cava is normal in size with greater than 50%  respiratory variability, suggesting right atrial pressure of 3 mmHg.   TELEMETRY reviewed by me 08/28/2023: atrial fibrillation rate 90-100s  EKG reviewed by me: AF RVR 149 bpm  Data reviewed by me 08/28/2023: last 24h vitals tele labs imaging I/O ED provider note, admission H&P, hospitalist progress note  Principal Problem:   COPD with acute exacerbation (HCC) Active Problems:   Paroxysmal atrial fibrillation with RVR (HCC)   Chronic combined systolic and diastolic heart failure (HCC)   HTN (hypertension)   Chronic hypoxic respiratory failure (HCC)   COPD exacerbation (HCC)    ASSESSMENT AND PLAN:  Kenneth Benson is a 62 y.o. 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 Cardiology was consulted for further evaluation.   # Atrial fibrillation RVR # Longstanding, persistent atrial fibrillation Patient presenting with shortness of breath and palpitations found to be in atrial fibrillation RVR on EKG in the setting of acute COPD exacerbation. -Given  end-stage COPD and poor candidacy for antiarrhythmic medications, we will plan for rate control strategy.  Defer consideration for cardioversion given patient's longstanding persistent atrial fibrillation, even if he converted to NSR with cardioversion he likely would not remain in rhythm. -Discontinue diltiazem infusion.  Start diltiazem 60 mg every 6 hours.  Continue metoprolol tartrate 50 mg twice daily. -Continue Eliquis 5 mg twice daily for stroke risk reduction.  # End-stage COPD # Chronic respiratory failure # COPD exacerbation Patient with chronic respiratory failure, end-stage COPD followed by pulmonology outpatient with multiple recent exacerbations. Initially required 4-5 L supplemental O2 now weaned to 2 L (baseline requirement). -Management per primary.  # Chronic HFmrEF # Hypotension Patient with hx of heart failure with mildly reduced EF 45-50% on echo 11/2022.  -Continue midodrine for BP support. -Continue jardiance 10 mg daily. Home torsemide and spironolactone held due to hypotension.   This patient's plan of care was discussed and created with Dr. Corky Sing and he is in agreement.  Signed: Gale Journey, PA-C  08/28/2023, 2:29 PM [facility] Card[facility]

## 2023-08-28 NOTE — Progress Notes (Signed)
HR elevated, convinced patient to take PO metoprolol that he had refused earlier.  Gave nighttime dose now.

## 2023-08-29 DIAGNOSIS — J441 Chronic obstructive pulmonary disease with (acute) exacerbation: Secondary | ICD-10-CM | POA: Diagnosis not present

## 2023-08-29 MED ORDER — DIGOXIN 0.25 MG/ML IJ SOLN
0.2500 mg | Freq: Once | INTRAMUSCULAR | Status: AC
  Administered 2023-08-29: 0.25 mg via INTRAVENOUS
  Filled 2023-08-29: qty 2

## 2023-08-29 MED ORDER — DIGOXIN 0.25 MG/ML IJ SOLN
0.2500 mg | Freq: Once | INTRAMUSCULAR | Status: AC
  Administered 2023-08-30: 0.25 mg via INTRAVENOUS
  Filled 2023-08-29: qty 2

## 2023-08-29 MED ORDER — IPRATROPIUM BROMIDE 0.02 % IN SOLN: 0.5000 mg | Freq: Four times a day (QID) | RESPIRATORY_TRACT | Status: DC

## 2023-08-29 MED ORDER — ORAL CARE MOUTH RINSE: 15.0000 mL | OROMUCOSAL | Status: DC | PRN

## 2023-08-29 MED ORDER — DIGOXIN 0.25 MG/ML IJ SOLN
0.5000 mg | Freq: Once | INTRAMUSCULAR | Status: AC
  Administered 2023-08-29: 0.5 mg via INTRAVENOUS
  Filled 2023-08-29: qty 2

## 2023-08-29 MED ORDER — DIGOXIN 125 MCG PO TABS
0.1250 mg | ORAL_TABLET | Freq: Every day | ORAL | Status: DC
  Administered 2023-08-30 – 2023-09-01 (×3): 0.125 mg via ORAL
  Filled 2023-08-29 (×3): qty 1

## 2023-08-29 MED ORDER — LEVALBUTEROL HCL 1.25 MG/0.5ML IN NEBU
1.2500 mg | INHALATION_SOLUTION | Freq: Four times a day (QID) | RESPIRATORY_TRACT | Status: DC
  Administered 2023-08-29: 1.25 mg via RESPIRATORY_TRACT
  Filled 2023-08-29: qty 0.5

## 2023-08-29 MED ORDER — IPRATROPIUM BROMIDE 0.02 % IN SOLN
0.5000 mg | Freq: Four times a day (QID) | RESPIRATORY_TRACT | Status: DC
  Administered 2023-08-29: 0.5 mg via RESPIRATORY_TRACT
  Filled 2023-08-29: qty 2.5

## 2023-08-29 MED ORDER — LEVALBUTEROL HCL 1.25 MG/0.5ML IN NEBU: 1.2500 mg | INHALATION_SOLUTION | Freq: Four times a day (QID) | RESPIRATORY_TRACT | Status: DC

## 2023-08-29 NOTE — Plan of Care (Signed)
  Problem: Activity: Goal: Ability to tolerate increased activity will improve Outcome: Progressing   Problem: Health Behavior/Discharge Planning: Goal: Ability to manage health-related needs will improve Outcome: Progressing   Problem: Clinical Measurements: Goal: Respiratory complications will improve Outcome: Progressing   Problem: Clinical Measurements: Goal: Cardiovascular complication will be avoided Outcome: Progressing   Problem: Nutrition: Goal: Adequate nutrition will be maintained Outcome: Progressing   Problem: Elimination: Goal: Will not experience complications related to bowel motility Outcome: Progressing   Problem: Elimination: Goal: Will not experience complications related to urinary retention Outcome: Progressing   Problem: Pain Managment: Goal: General experience of comfort will improve Outcome: Progressing   Problem: Safety: Goal: Ability to remain free from injury will improve Outcome: Progressing

## 2023-08-29 NOTE — Plan of Care (Signed)
  Problem: Education: Goal: Knowledge of disease or condition will improve Outcome: Progressing Goal: Knowledge of the prescribed therapeutic regimen will improve Outcome: Progressing Goal: Individualized Educational Video(s) Outcome: Progressing   Problem: Activity: Goal: Ability to tolerate increased activity will improve Outcome: Progressing Goal: Will verbalize the importance of balancing activity with adequate rest periods Outcome: Progressing   Problem: Respiratory: Goal: Ability to maintain a clear airway will improve Outcome: Progressing Goal: Levels of oxygenation will improve Outcome: Progressing Goal: Ability to maintain adequate ventilation will improve Outcome: Progressing   Problem: Education: Goal: Knowledge of disease or condition will improve Outcome: Progressing Goal: Understanding of medication regimen will improve Outcome: Progressing Goal: Individualized Educational Video(s) Outcome: Progressing   Problem: Activity: Goal: Ability to tolerate increased activity will improve Outcome: Progressing   Problem: Cardiac: Goal: Ability to achieve and maintain adequate cardiopulmonary perfusion will improve Outcome: Progressing   Problem: Health Behavior/Discharge Planning: Goal: Ability to safely manage health-related needs after discharge will improve Outcome: Progressing   Problem: Education: Goal: Knowledge of General Education information will improve Description: Including pain rating scale, medication(s)/side effects and non-pharmacologic comfort measures Outcome: Progressing   Problem: Health Behavior/Discharge Planning: Goal: Ability to manage health-related needs will improve Outcome: Progressing   Problem: Clinical Measurements: Goal: Ability to maintain clinical measurements within normal limits will improve Outcome: Progressing Goal: Will remain free from infection Outcome: Progressing Goal: Diagnostic test results will improve Outcome:  Progressing Goal: Respiratory complications will improve Outcome: Progressing Goal: Cardiovascular complication will be avoided Outcome: Progressing   Problem: Activity: Goal: Risk for activity intolerance will decrease Outcome: Progressing   Problem: Nutrition: Goal: Adequate nutrition will be maintained Outcome: Progressing   Problem: Coping: Goal: Level of anxiety will decrease Outcome: Progressing   Problem: Elimination: Goal: Will not experience complications related to bowel motility Outcome: Progressing Goal: Will not experience complications related to urinary retention Outcome: Progressing   Problem: Pain Managment: Goal: General experience of comfort will improve Outcome: Progressing   Problem: Safety: Goal: Ability to remain free from injury will improve Outcome: Progressing   Problem: Skin Integrity: Goal: Risk for impaired skin integrity will decrease Outcome: Progressing   

## 2023-08-29 NOTE — Progress Notes (Addendum)
PROGRESS NOTE    Kenneth Benson  ZOX:[id number] DOB: 1961/03/06 DOA: 08/22/2023 PCP: Pcp, No  [id number]-[id number]  LOS: 6 days   Brief hospital course:   Assessment & Plan: Kenneth Benson is a 62 y.o. male with medical history significant of chronic hypoxic respiratory failure on 2 L secondary to end-stage COPD, atrial fibrillation on Eliquis, HFmrEF with last EF of 45-50%, who presented to the ED due to shortness of breath.   Kenneth Benson states that over the last few days, he has been experiencing gradually worsening shortness of breath and feeling that his lungs are tight.  In addition, it seems that mucus accumulation has been recurring faster and is more difficult for him to clear   * COPD with acute exacerbation (HCC) Patient has a history of severe end-stage COPD on triple bronchodilators with recurrent COPD exacerbations over the last few months, coming in with increased shortness of breath and cough over the last few days. --CXR no acute finding.  Covid and RVP neg. - S/p Solu-Medrol 125 mg once f/b prednisone 40 mg daily for 5 days. --completed 5 days of azithromycin Plan: --xopenex and ipratropium nebs scheduled - Continue home bronchodilators  # Atrial fibrillation RVR # Longstanding, persistent atrial fibrillation  Per chart review, he frequently has RVR during COPD exacerbations. --s/p IV dilt 15 mg f/b dilt gtt.  Remained in RVR persistently, rate control agents limited by low BP, therefore, cardio consulted. Plan: --cont oral dilt to 60 mg q6h, per cardio --cont Lopressor at increased 50 mg BID --start IV digoxin, per cardio - Continue home Eliquis  Acute on Chronic hypoxic respiratory failure (HCC) on 2L O2 at baseline --increased O2 requirement to 4-5L, now back on home 2L. --Continue supplemental O2 to keep sats between 88-92%  Chronic combined systolic and diastolic heart failure (HCC) Patient is euvolemic on examination, and he states he has been strictly compliant  with his home regimen. Plan: --cont home Jardiance --hold home torsemide and spironolactone due to soft BP  HTN (hypertension), currently not active --hold home torsemide and spironolactone due to soft BP  Hypotension --cont midodrine 10 mg TID   DVT prophylaxis: [id number] Code Status: Full code  Family Communication:  Level of care: Telemetry Cardiac Dispo:   The patient is from: home Anticipated d/c is to: home Anticipated d/c date is: when heart rate controlled and cleared by cardio for discharge.   Subjective and Interval History:  Pt reported breathing mostly back to baseline, but does get more dyspneic around the time when nebs are due.   Objective: Vitals:   08/29/23 1100 08/29/23 1359 08/29/23 1401 08/29/23 1500  BP: 94/79   108/86  Pulse: 91  95   Resp: (!) 28  (!) 22 (!) 24  Temp: 97.9 F (36.6 C)   98.3 F (36.8 C)  TempSrc: Oral   Oral  SpO2: 96% (!) 85% 92% 92%  Weight:      Height:        Intake/Output Summary (Last 24 hours) at 08/29/2023 1657 Last data filed at 08/29/2023 1445 Gross per 24 hour  Intake 490 ml  Output 1400 ml  Net -910 ml   Filed Weights   08/22/23 1046 08/28/23 0525  Weight: 122.5 kg 130.9 kg    Examination:   Constitutional: NAD, AAOx3 HEENT: conjunctivae and lids normal, EOMI CV: No cyanosis.   RESP: normal respiratory effort, on 2.5L Neuro: II - XII grossly intact.   Psych: Normal mood and affect.  Appropriate  judgement and reason   Data Reviewed: I have personally reviewed labs and imaging studies  Time spent: 35 minutes  Darlin Priestly, MD [facility] If 7PM-7AM, please contact night-coverage 08/29/2023, 4:57 PM

## 2023-08-29 NOTE — Progress Notes (Signed)
[facility] CLINIC CARDIOLOGY CONSULT NOTE       Patient ID: Kenneth Benson MRN: [id number] DOB/AGE: Jan 13, 1961 62 y.o.  Admit date: 08/22/2023 Referring Physician Dr. Darlin Priestly Primary Physician none Primary Cardiologist Beatrix Fetters (last seen 02/2022) Reason for Consultation atrial fibrillation RVR  HPI: Kenneth Benson is a 62 y.o. 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 Cardiology was consulted for further evaluation.   Interval history:  -Patient feeling ok today, states breathing is slightly worse.  -HR remains borderline elevated 90-100s. Denies palpitations or CP.  -BP remains borderline, continues on midodrine.  Review of systems complete and found to be negative unless listed above    Past Medical History:  Diagnosis Date   A-fib (HCC) 02/06/2022   AKI (acute kidney injury) (HCC) 12/17/2022   Arrhythmia    atrial fibrillation   Asthma    CHF (congestive heart failure) (HCC)    COPD (chronic obstructive pulmonary disease) (HCC)    Hyperkalemia 12/17/2022   Hypokalemia 04/13/2023   Myocardial injury 04/13/2023   Tobacco abuse     History reviewed. No pertinent surgical history.  Medications Prior to Admission  Medication Sig Dispense Refill Last Dose   albuterol (PROVENTIL) (2.5 MG/3ML) 0.083% nebulizer solution Take 3 mLs (2.5 mg total) by nebulization every 6 (six) hours as needed for wheezing or shortness of breath. 360 mL 1 08/21/2023   albuterol (VENTOLIN HFA) 108 (90 Base) MCG/ACT inhaler Inhale 2 puffs into the lungs every 6 (six) hours as needed for wheezing or shortness of breath. 18 g 1 08/21/2023   apixaban (ELIQUIS) 5 MG TABS tablet Take 1 tablet (5 mg total) by mouth 2 (two) times daily. 60 tablet 3 08/22/2023   arformoterol (BROVANA) 15 MCG/2ML NEBU Take 2 mLs (15 mcg total) by nebulization 2 (two) times daily. 120 mL 2  08/22/2023   diltiazem (CARDIZEM CD) 240 MG 24 hr capsule Take 1 capsule (240 mg total) by mouth daily. 30 capsule 3 08/22/2023   JARDIANCE 10 MG TABS tablet Take 1 tablet (10 mg total) by mouth daily. 30 tablet 3 08/22/2023   spironolactone (ALDACTONE) 25 MG tablet Take 0.5 tablets (12.5 mg total) by mouth daily. 30 tablet 3 08/22/2023   torsemide (DEMADEX) 20 MG tablet Take 2 tablets (40 mg total) by mouth daily. 60 tablet 1 08/22/2023   metoprolol tartrate (LOPRESSOR) 50 MG tablet Take 1 tablet (50 mg total) by mouth 2 (two) times daily. (Patient not taking: Reported on 08/22/2023) 60 tablet 3 Not Taking   Social History   Socioeconomic History   Marital status: Single    Spouse name: Not on file   Number of children: Not on file   Years of education: Not on file   Highest education level: Not on file  Occupational History   Not on file  Tobacco Use   Smoking status: Every Day    Current packs/day: 1.50    Average packs/day: 1.5 packs/day for 27.0 years (40.5 ttl pk-yrs)    Types: Cigarettes   Smokeless tobacco: Not on file   Tobacco comments:    0.5PPD at the most.  trying to quit.  05/11/2023 hfb  Vaping Use   Vaping status: Never Used  Substance and Sexual Activity   Alcohol use: No   Drug use: No   Sexual activity: Not on file  Other Topics Concern  Not on file  Social History Narrative   Not on file   Social Determinants of Health   Financial Resource Strain: Not on file  Food Insecurity: No Food Insecurity (08/22/2023)   Hunger Vital Sign    Worried About Running Out of Food in the Last Year: Never true    Ran Out of Food in the Last Year: Never true  Transportation Needs: No Transportation Needs (08/22/2023)   PRAPARE - Administrator, Civil Service (Medical): No    Lack of Transportation (Non-Medical): No  Physical Activity: Not on file  Stress: Not on file  Social Connections: Not on file  Intimate Partner Violence: Not At Risk (08/22/2023)    Humiliation, Afraid, Rape, and Kick questionnaire    Fear of Current or Ex-Partner: No    Emotionally Abused: No    Physically Abused: No    Sexually Abused: No    Family History  Problem Relation Age of Onset   Heart disease Mother    Atrial fibrillation Father      Vitals:   08/29/23 0800 08/29/23 0900 08/29/23 1000 08/29/23 1100  BP: 113/85 101/80 96/74 94/79   Pulse: (!) 108 97 87 91  Resp: (!) 24 (!) 25 (!) 28 (!) 28  Temp:    97.9 F (36.6 C)  TempSrc:    Oral  SpO2:    96%  Weight:      Height:        PHYSICAL EXAM General: Chronically ill-appearing male, well nourished, in no acute distress nearly flat in hospital bed. HEENT: Normocephalic and atraumatic. Neck: No JVD.  Lungs: Normal respiratory effort on 2L [place]. Clear bilaterally to auscultation. No wheezes, crackles, rhonchi.  Heart: Irregularly irregular. Normal S1 and S2 without gallops or murmurs.  Abdomen: Non-distended appearing.  Msk: Normal strength and tone for age. Extremities: Warm and well perfused. No clubbing, cyanosis. No edema.  Neuro: Alert and oriented X 3. Psych: Answers questions appropriately.   Labs: Basic Metabolic Panel: Recent Labs    08/27/23 0621 08/28/23 0521  NA 142 139  K 4.2 4.2  CL 98 96*  CO2 39* 35*  GLUCOSE 91 94  BUN 24* 24*  CREATININE 0.78 0.76  CALCIUM 8.7* 8.5*  MG 2.4 2.2   Liver Function Tests: No results for input(s): "AST", "ALT", "ALKPHOS", "BILITOT", "PROT", "ALBUMIN" in the last 72 hours. No results for input(s): "LIPASE", "AMYLASE" in the last 72 hours. CBC: Recent Labs    08/27/23 0621 08/28/23 0521  WBC 8.1 8.0  HGB 14.4 14.4  HCT 44.9 44.3  MCV 92.2 90.6  PLT 199 217   Cardiac Enzymes: No results for input(s): "CKTOTAL", "CKMB", "CKMBINDEX", "TROPONINIHS" in the last 72 hours. BNP: No results for input(s): "BNP" in the last 72 hours. D-Dimer: No results for input(s): "DDIMER" in the last 72 hours. Hemoglobin A1C: No results for  input(s): "HGBA1C" in the last 72 hours. Fasting Lipid Panel: No results for input(s): "CHOL", "HDL", "LDLCALC", "TRIG", "CHOLHDL", "LDLDIRECT" in the last 72 hours. Thyroid Function Tests: No results for input(s): "TSH", "T4TOTAL", "T3FREE", "THYROIDAB" in the last 72 hours.  Invalid input(s): "FREET3" Anemia Panel: No results for input(s): "VITAMINB12", "FOLATE", "FERRITIN", "TIBC", "IRON", "RETICCTPCT" in the last 72 hours.   Radiology: [facility] Chest Port 1 View  Result Date: 08/22/2023 CLINICAL DATA:  Shortness of breath for several days. Wheezing. On home oxygen. EXAM: PORTABLE CHEST 1 VIEW COMPARISON:  Radiographs 07/04/2023 and 07/02/2023.  CT 07/02/2023. FINDINGS: 1149 hours. Lordotic positioning.  Stable cardiomegaly and mild bibasilar atelectasis. No edema, confluent airspace disease, pleural effusion or pneumothorax. The bones appear unchanged. Telemetry leads overlie the chest. IMPRESSION: Stable cardiomegaly and mild bibasilar atelectasis. No acute cardiopulmonary process. Electronically Signed   By: Carey Bullocks M.D.   On: 08/22/2023 13:38    ECHO 11/2022:  1. Left ventricular ejection fraction, by estimation, is 45 to 50%. The left ventricle has mildly decreased function. The left ventricle has no regional wall motion abnormalities. The left ventricular internal cavity size was mildly dilated. Left ventricular diastolic parameters were normal.   2. Right ventricular systolic function is normal. The right ventricular  size is normal. There is mildly elevated pulmonary artery systolic  pressure.   3. The mitral valve is normal in structure. Moderate mitral valve  regurgitation. No evidence of mitral stenosis.   4. Tricuspid valve regurgitation is moderate.   5. The aortic valve is normal in structure. Aortic valve regurgitation is  not visualized. No aortic stenosis is present.   6. The inferior vena cava is normal in size with greater than 50%  respiratory variability, suggesting  right atrial pressure of 3 mmHg.   TELEMETRY reviewed by me 08/29/2023: atrial fibrillation with PVCs rate 90-100, better controlled earlier this AM  EKG reviewed by me: AF RVR 149 bpm  Data reviewed by me 08/29/2023: last 24h vitals tele labs imaging I/O ED provider note, admission H&P, hospitalist progress note  Principal Problem:   COPD with acute exacerbation (HCC) Active Problems:   Paroxysmal atrial fibrillation with RVR (HCC)   Chronic combined systolic and diastolic heart failure (HCC)   HTN (hypertension)   Chronic hypoxic respiratory failure (HCC)   COPD exacerbation (HCC)    ASSESSMENT AND PLAN:  ALEXANDRA MOSQUEDA is a 62 y.o. 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 Cardiology was consulted for further evaluation.   # Atrial fibrillation RVR # Longstanding, persistent atrial fibrillation Patient presenting with shortness of breath and palpitations found to be in atrial fibrillation RVR on EKG in the setting of acute COPD exacerbation. -Given end-stage COPD and poor candidacy for antiarrhythmic medications, we will plan for rate control strategy.  Defer consideration for cardioversion given patient's longstanding persistent atrial fibrillation, even if he converted to NSR with cardioversion he likely would not remain in rhythm. -Add IV digoxin for rate control, 0.5 mg followed by 0.25 mg after 6 hours followed by 0.25 mg after 6 hours, then will switch to po 0.125 mg tomorrow. Continue diltiazem 60 mg every 6 hours, plan to consolidate tomorrow.  Continue metoprolol tartrate 50 mg twice daily. -Continue Eliquis 5 mg twice daily for stroke risk reduction.  # End-stage COPD # Chronic respiratory failure # COPD exacerbation Patient with chronic respiratory failure, end-stage COPD followed by pulmonology outpatient with multiple recent  exacerbations. Initially required 4-5 L supplemental O2 now weaned to 2 L (baseline requirement). -Management per primary.  # Chronic HFmrEF # Hypotension Patient with hx of heart failure with mildly reduced EF 45-50% on echo 11/2022.  -Continue midodrine for BP support. -Continue jardiance 10 mg daily. Home torsemide and spironolactone held due to hypotension.   This patient's plan of care was discussed and created with Dr. Corky Sing and he is in agreement.  Signed: Gale Journey, PA-C  08/29/2023, 1:44 PM [facility] Cardiology

## 2023-08-30 DIAGNOSIS — J441 Chronic obstructive pulmonary disease with (acute) exacerbation: Secondary | ICD-10-CM | POA: Diagnosis not present

## 2023-08-30 LAB — CBC
HCT: 48.3 % (ref 39.0–52.0)
Hemoglobin: 15.3 g/dL (ref 13.0–17.0)
MCH: 29.4 pg (ref 26.0–34.0)
MCHC: 31.7 g/dL (ref 30.0–36.0)
MCV: 92.7 fL (ref 80.0–100.0)
Platelets: 218 10*3/uL (ref 150–400)
RBC: 5.21 MIL/uL (ref 4.22–5.81)
RDW: 19.6 % — ABNORMAL HIGH (ref 11.5–15.5)
WBC: 7.4 10*3/uL (ref 4.0–10.5)
nRBC: 0 % (ref 0.0–0.2)

## 2023-08-30 LAB — BASIC METABOLIC PANEL
Anion gap: 5 (ref 5–15)
BUN: 24 mg/dL — ABNORMAL HIGH (ref 6–20)
CO2: 37 mmol/L — ABNORMAL HIGH (ref 22–32)
Calcium: 8.5 mg/dL — ABNORMAL LOW (ref 8.9–10.3)
Chloride: 98 mmol/L (ref 98–111)
Creatinine, Ser: 0.87 mg/dL (ref 0.61–1.24)
GFR, Estimated: >60 mL/min (ref >60)
Glucose, Bld: 104 mg/dL — ABNORMAL HIGH (ref 70–99)
Potassium: 4.6 mmol/L (ref 3.5–5.1)
Sodium: 140 mmol/L (ref 135–145)

## 2023-08-30 LAB — MAGNESIUM: Magnesium: 2.3 mg/dL (ref 1.7–2.4)

## 2023-08-30 LAB — DIGOXIN LEVEL: Digoxin Level: 0.9 ng/mL (ref 0.8–2.0)

## 2023-08-30 MED ORDER — ALPRAZOLAM 0.25 MG PO TABS: 0.2500 mg | ORAL_TABLET | Freq: Two times a day (BID) | ORAL | Status: DC | PRN

## 2023-08-30 MED ORDER — IPRATROPIUM BROMIDE 0.02 % IN SOLN
0.5000 mg | Freq: Four times a day (QID) | RESPIRATORY_TRACT | Status: DC
  Administered 2023-08-30 – 2023-09-01 (×10): 0.5 mg via RESPIRATORY_TRACT
  Filled 2023-08-30 (×9): qty 2.5

## 2023-08-30 MED ORDER — LEVALBUTEROL HCL 1.25 MG/0.5ML IN NEBU
1.2500 mg | INHALATION_SOLUTION | Freq: Four times a day (QID) | RESPIRATORY_TRACT | Status: DC
  Administered 2023-08-30 – 2023-09-01 (×10): 1.25 mg via RESPIRATORY_TRACT
  Filled 2023-08-30 (×10): qty 0.5

## 2023-08-30 MED ORDER — ALBUTEROL SULFATE (2.5 MG/3ML) 0.083% IN NEBU
2.5000 mg | INHALATION_SOLUTION | RESPIRATORY_TRACT | Status: DC | PRN
  Administered 2023-08-30: 2.5 mg via RESPIRATORY_TRACT
  Filled 2023-08-30: qty 3

## 2023-08-30 MED ORDER — METHYLPREDNISOLONE SODIUM SUCC 40 MG IJ SOLR
40.0000 mg | Freq: Two times a day (BID) | INTRAMUSCULAR | Status: DC
  Administered 2023-08-30 – 2023-08-31 (×3): 40 mg via INTRAVENOUS
  Filled 2023-08-30 (×3): qty 1

## 2023-08-30 MED ORDER — DILTIAZEM HCL ER COATED BEADS 120 MG PO CP24: 120.0000 mg | ORAL_CAPSULE | Freq: Every day | ORAL | Status: DC

## 2023-08-30 MED ORDER — DILTIAZEM HCL ER COATED BEADS 120 MG PO CP24
120.0000 mg | ORAL_CAPSULE | Freq: Every day | ORAL | Status: DC
  Administered 2023-08-30 – 2023-08-31 (×2): 120 mg via ORAL
  Filled 2023-08-30 (×2): qty 1

## 2023-08-30 NOTE — Progress Notes (Signed)
PROGRESS NOTE    Kenneth Benson  YNW:[id number] DOB: 10/14/1961 DOA: 08/22/2023 PCP: Pcp, No  Assessment & Plan:   Principal Problem:   COPD with acute exacerbation (HCC) Active Problems:   Paroxysmal atrial fibrillation with RVR (HCC)   Chronic hypoxic respiratory failure (HCC)   Chronic combined systolic and diastolic heart failure (HCC)   HTN (hypertension)   COPD exacerbation (HCC)  Assessment and Plan: COPD exacerbation: w/ hx of severe end stage COPD. Restarted IV steroids. Completed 5 days of azithromycin. Bronchodilators prn. COVID19 was neg    Persistent a. fib: continue on metoprolol, diltiazem, digoxin & elquis   Acute on chronic hypoxic respiratory failure: on 2L O2 at baseline. Continue on supplemental oxygen and wean back to baseline as tolerated   Chronic combined CHF: appears euvolemic. Monitor I/Os. Holding aldactone, torsemide, jardiance    HTN: BP on low end b/c of metoprolol, diltiazem but needed for a. fib   Hypotension: continue on midodrine      DVT prophylaxis: eliquis Code Status: full  Family Communication: Disposition Plan: likely d/c back home  Level of care: Telemetry Cardiac Consultants:    Procedures:   Antimicrobials:    Subjective: Pt c/o shortness of breath  Objective: Vitals:   08/30/23 0200 08/30/23 0400 08/30/23 0454 08/30/23 0602  BP: 111/82 122/81  113/61  Pulse: 92   70  Resp: (!) 21 (!) 25  (!) 23  Temp:  98.2 F (36.8 C)    TempSrc:  Oral    SpO2: 93% 93% 93% 90%  Weight:      Height:        Intake/Output Summary (Last 24 hours) at 08/30/2023 0804 Last data filed at 08/30/2023 0612 Gross per 24 hour  Intake 608 ml  Output 3400 ml  Net -2792 ml   Filed Weights   08/22/23 1046 08/28/23 0525  Weight: 122.5 kg 130.9 kg    Examination:  General exam: Appears calm but uncomfortable  Respiratory system: Course breath sounds b/l. Wheezes b/l  Cardiovascular system: S1 & S2 b/l. No  rubs, gallops or  clicks. Gastrointestinal system: Abdomen is nondistended, soft and nontender. Normal bowel sounds heard. Central nervous system: Alert and oriented. Moves all extremities  Psychiatry: Judgement and insight appear normal. Flat mood and affect    Data Reviewed: I have personally reviewed following labs and imaging studies  CBC: Recent Labs  Lab 08/27/23 0621 08/28/23 0521 08/30/23 0454  WBC 8.1 8.0 7.4  HGB 14.4 14.4 15.3  HCT 44.9 44.3 48.3  MCV 92.2 90.6 92.7  PLT 199 217 218   Basic Metabolic Panel: Recent Labs  Lab 08/27/23 0621 08/28/23 0521 08/30/23 0454  NA 142 139 140  K 4.2 4.2 4.6  CL 98 96* 98  CO2 39* 35* 37*  GLUCOSE 91 94 104*  BUN 24* 24* 24*  CREATININE 0.78 0.76 0.87  CALCIUM 8.7* 8.5* 8.5*  MG 2.4 2.2 2.3   GFR: Estimated Creatinine Clearance: 126.3 mL/min (by C-G formula based on SCr of 0.87 mg/dL). Liver Function Tests: No results for input(s): "AST", "ALT", "ALKPHOS", "BILITOT", "PROT", "ALBUMIN" in the last 168 hours. No results for input(s): "LIPASE", "AMYLASE" in the last 168 hours. No results for input(s): "AMMONIA" in the last 168 hours. Coagulation Profile: No results for input(s): "INR", "PROTIME" in the last 168 hours. Cardiac Enzymes: No results for input(s): "CKTOTAL", "CKMB", "CKMBINDEX", "TROPONINI" in the last 168 hours. BNP (last 3 results) No results for input(s): "PROBNP" in the last 8760  hours. HbA1C: No results for input(s): "HGBA1C" in the last 72 hours. CBG: No results for input(s): "GLUCAP" in the last 168 hours. Lipid Profile: No results for input(s): "CHOL", "HDL", "LDLCALC", "TRIG", "CHOLHDL", "LDLDIRECT" in the last 72 hours. Thyroid Function Tests: No results for input(s): "TSH", "T4TOTAL", "FREET4", "T3FREE", "THYROIDAB" in the last 72 hours. Anemia Panel: No results for input(s): "VITAMINB12", "FOLATE", "FERRITIN", "TIBC", "IRON", "RETICCTPCT" in the last 72 hours. Sepsis Labs: No results for input(s):  "PROCALCITON", "LATICACIDVEN" in the last 168 hours.  Recent Results (from the past 240 hour(s))  SARS Coronavirus 2 by RT PCR (hospital order, performed in [facility] hospital lab) *cepheid single result test* Anterior Nasal Swab     Status: None   Collection Time: 08/22/23 11:30 AM   Specimen: Anterior Nasal Swab  Result Value Ref Range Status   SARS Coronavirus 2 by RT PCR NEGATIVE NEGATIVE Final    Comment: (NOTE) SARS-CoV-2 target nucleic acids are NOT DETECTED.  The SARS-CoV-2 RNA is generally detectable in upper and lower respiratory specimens during the acute phase of infection. The lowest concentration of SARS-CoV-2 viral copies this assay can detect is 250 copies / mL. A negative result does not preclude SARS-CoV-2 infection and should not be used as the sole basis for treatment or other patient management decisions.  A negative result may occur with improper specimen collection / handling, submission of specimen other than nasopharyngeal swab, presence of viral mutation(s) within the areas targeted by this assay, and inadequate number of viral copies (<250 copies / mL). A negative result must be combined with clinical observations, patient history, and epidemiological information.  Fact Sheet for Patients:   RoadLapTop.co.za  Fact Sheet for Healthcare Providers: http://kim-miller.com/  This test is not yet approved or  cleared by the [place] FDA and has been authorized for detection and/or diagnosis of SARS-CoV-2 by FDA under an Emergency Use Authorization (EUA).  This EUA will remain in effect (meaning this test can be used) for the duration of the COVID-19 declaration under Section 564(b)(1) of the Act, 21 U.S.C. section 360bbb-3(b)(1), unless the authorization is terminated or revoked sooner.  Performed at [facility], [address] [place], [place] [address]   Respiratory (~20 pathogens) panel by  PCR     Status: None   Collection Time: 08/24/23 10:29 AM   Specimen: Nasopharyngeal Swab; Respiratory  Result Value Ref Range Status   Adenovirus NOT DETECTED NOT DETECTED Final   Coronavirus 229E NOT DETECTED NOT DETECTED Final    Comment: (NOTE) The Coronavirus on the Respiratory Panel, DOES NOT test for the novel  Coronavirus (2019 nCoV)    Coronavirus HKU1 NOT DETECTED NOT DETECTED Final   Coronavirus NL63 NOT DETECTED NOT DETECTED Final   Coronavirus OC43 NOT DETECTED NOT DETECTED Final   Metapneumovirus NOT DETECTED NOT DETECTED Final   Rhinovirus / Enterovirus NOT DETECTED NOT DETECTED Final   Influenza A NOT DETECTED NOT DETECTED Final   Influenza B NOT DETECTED NOT DETECTED Final   Parainfluenza Virus 1 NOT DETECTED NOT DETECTED Final   Parainfluenza Virus 2 NOT DETECTED NOT DETECTED Final   Parainfluenza Virus 3 NOT DETECTED NOT DETECTED Final   Parainfluenza Virus 4 NOT DETECTED NOT DETECTED Final   Respiratory Syncytial Virus NOT DETECTED NOT DETECTED Final   Bordetella pertussis NOT DETECTED NOT DETECTED Final   Bordetella Parapertussis NOT DETECTED NOT DETECTED Final   Chlamydophila pneumoniae NOT DETECTED NOT DETECTED Final   Mycoplasma pneumoniae NOT DETECTED NOT DETECTED Final  Comment: Performed at [facility], [address], [place], [place] [address]         Radiology Studies: No results found.      Scheduled Meds:  apixaban  5 mg Oral BID   digoxin  0.125 mg Oral Daily   diltiazem  60 mg Oral Q6H   empagliflozin  10 mg Oral Daily   ipratropium  0.5 mg Nebulization Q6H   levalbuterol  1.25 mg Nebulization Q6H   metoprolol tartrate  50 mg Oral BID   midodrine  10 mg Oral TID WC   sodium chloride flush  3 mL Intravenous Q12H   Continuous Infusions:   LOS: 7 days       Charise Killian, MD [facility] Pager [phone]  If 7PM-7AM, please contact night-coverage [email or web address] 08/30/2023, 8:04 AM

## 2023-08-30 NOTE — Progress Notes (Addendum)
Pt is complaining of difficulty breathing and is requesting for breathing treatment. MD Para March made aware. Will continue ro monitor.  Update 0447: See new orders. Will continue to monitor.

## 2023-08-30 NOTE — Plan of Care (Signed)
  Problem: Education: Goal: Knowledge of disease or condition will improve Outcome: Progressing Goal: Knowledge of the prescribed therapeutic regimen will improve Outcome: Progressing   Problem: Activity: Goal: Ability to tolerate increased activity will improve Outcome: Progressing   Problem: Education: Goal: Knowledge of disease or condition will improve Outcome: Progressing Goal: Understanding of medication regimen will improve Outcome: Progressing

## 2023-08-30 NOTE — Progress Notes (Signed)
[facility] CARDIOLOGY CONSULT NOTE       Patient ID: Kenneth Benson MRN: [id number] DOB/AGE: 12-01-1960 62 y.o.  Admit date: 08/22/2023 Referring Physician Dr. Darlin Priestly Primary Physician none Primary Cardiologist Beatrix Fetters (last seen 02/2022) Reason for Consultation atrial fibrillation RVR  HPI: Kenneth Benson is a 62 y.o. 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 Cardiology was consulted for further evaluation.   Interval history:  -Patient feeling ok this AM, continues to report intermittent worsening of SOB that improves with breathing treatments.  -HR better controlled this AM, short 2 sec pause overnight. Remains in atrial fibrillation -Appears to be tolerating digoxin, will check level later today.   Review of systems complete and found to be negative unless listed above    Past Medical History:  Diagnosis Date   A-fib (HCC) 02/06/2022   AKI (acute kidney injury) (HCC) 12/17/2022   Arrhythmia    atrial fibrillation   Asthma    CHF (congestive heart failure) (HCC)    COPD (chronic obstructive pulmonary disease) (HCC)    Hyperkalemia 12/17/2022   Hypokalemia 04/13/2023   Myocardial injury 04/13/2023   Tobacco abuse     History reviewed. No pertinent surgical history.  Medications Prior to Admission  Medication Sig Dispense Refill Last Dose   albuterol (PROVENTIL) (2.5 MG/3ML) 0.083% nebulizer solution Take 3 mLs (2.5 mg total) by nebulization every 6 (six) hours as needed for wheezing or shortness of breath. 360 mL 1 08/21/2023   albuterol (VENTOLIN HFA) 108 (90 Base) MCG/ACT inhaler Inhale 2 puffs into the lungs every 6 (six) hours as needed for wheezing or shortness of breath. 18 g 1 08/21/2023   apixaban (ELIQUIS) 5 MG TABS tablet Take 1 tablet (5 mg total) by mouth 2 (two) times daily. 60 tablet 3 08/22/2023   arformoterol (BROVANA)  15 MCG/2ML NEBU Take 2 mLs (15 mcg total) by nebulization 2 (two) times daily. 120 mL 2 08/22/2023   diltiazem (CARDIZEM CD) 240 MG 24 hr capsule Take 1 capsule (240 mg total) by mouth daily. 30 capsule 3 08/22/2023   JARDIANCE 10 MG TABS tablet Take 1 tablet (10 mg total) by mouth daily. 30 tablet 3 08/22/2023   spironolactone (ALDACTONE) 25 MG tablet Take 0.5 tablets (12.5 mg total) by mouth daily. 30 tablet 3 08/22/2023   torsemide (DEMADEX) 20 MG tablet Take 2 tablets (40 mg total) by mouth daily. 60 tablet 1 08/22/2023   metoprolol tartrate (LOPRESSOR) 50 MG tablet Take 1 tablet (50 mg total) by mouth 2 (two) times daily. (Patient not taking: Reported on 08/22/2023) 60 tablet 3 Not Taking   Social History   Socioeconomic History   Marital status: Single    Spouse name: Not on file   Number of children: Not on file   Years of education: Not on file   Highest education level: Not on file  Occupational History   Not on file  Tobacco Use   Smoking status: Every Day    Current packs/day: 1.50    Average packs/day: 1.5 packs/day for 27.0 years (40.5 ttl pk-yrs)    Types: Cigarettes   Smokeless tobacco: Not on file   Tobacco comments:    0.5PPD at the most.  trying to quit.  05/11/2023 hfb  Vaping Use   Vaping status: Never Used  Substance and Sexual Activity   Alcohol use: No  Drug use: No   Sexual activity: Not on file  Other Topics Concern   Not on file  Social History Narrative   Not on file   Social Determinants of Health   Financial Resource Strain: Not on file  Food Insecurity: No Food Insecurity (08/22/2023)   Hunger Vital Sign    Worried About Running Out of Food in the Last Year: Never true    Ran Out of Food in the Last Year: Never true  Transportation Needs: No Transportation Needs (08/22/2023)   PRAPARE - Administrator, Civil Service (Medical): No    Lack of Transportation (Non-Medical): No  Physical Activity: Not on file  Stress: Not on file  Social  Connections: Not on file  Intimate Partner Violence: Not At Risk (08/22/2023)   Humiliation, Afraid, Rape, and Kick questionnaire    Fear of Current or Ex-Partner: No    Emotionally Abused: No    Physically Abused: No    Sexually Abused: No    Family History  Problem Relation Age of Onset   Heart disease Mother    Atrial fibrillation Father      Vitals:   08/30/23 0602 08/30/23 0820 08/30/23 0858 08/30/23 0901  BP: 113/61  129/88   Pulse: 70 72 80   Resp: (!) 23 20 18    Temp:    97.7 F (36.5 C)  TempSrc:      SpO2: 90% 96% 90%   Weight:      Height:        PHYSICAL EXAM General: Chronically ill-appearing male, well nourished, in no acute distress laying nearly flat in hospital bed. HEENT: Normocephalic and atraumatic. Neck: No JVD.  Lungs: Normal respiratory effort on CPAP. Clear bilaterally to auscultation. No wheezes, crackles, rhonchi.  Heart: Irregularly irregular, controlled rate. Normal S1 and S2 without gallops or murmurs.  Abdomen: Non-distended appearing.  Msk: Normal strength and tone for age. Extremities: Warm and well perfused. No clubbing, cyanosis. No edema.  Neuro: Alert and oriented X 3. Psych: Answers questions appropriately.   Labs: Basic Metabolic Panel: Recent Labs    08/28/23 0521 08/30/23 0454  NA 139 140  K 4.2 4.6  CL 96* 98  CO2 35* 37*  GLUCOSE 94 104*  BUN 24* 24*  CREATININE 0.76 0.87  CALCIUM 8.5* 8.5*  MG 2.2 2.3   Liver Function Tests: No results for input(s): "AST", "ALT", "ALKPHOS", "BILITOT", "PROT", "ALBUMIN" in the last 72 hours. No results for input(s): "LIPASE", "AMYLASE" in the last 72 hours. CBC: Recent Labs    08/28/23 0521 08/30/23 0454  WBC 8.0 7.4  HGB 14.4 15.3  HCT 44.3 48.3  MCV 90.6 92.7  PLT 217 218   Cardiac Enzymes: No results for input(s): "CKTOTAL", "CKMB", "CKMBINDEX", "TROPONINIHS" in the last 72 hours. BNP: No results for input(s): "BNP" in the last 72 hours. D-Dimer: No results for  input(s): "DDIMER" in the last 72 hours. Hemoglobin A1C: No results for input(s): "HGBA1C" in the last 72 hours. Fasting Lipid Panel: No results for input(s): "CHOL", "HDL", "LDLCALC", "TRIG", "CHOLHDL", "LDLDIRECT" in the last 72 hours. Thyroid Function Tests: No results for input(s): "TSH", "T4TOTAL", "T3FREE", "THYROIDAB" in the last 72 hours.  Invalid input(s): "FREET3" Anemia Panel: No results for input(s): "VITAMINB12", "FOLATE", "FERRITIN", "TIBC", "IRON", "RETICCTPCT" in the last 72 hours.   Radiology: [facility] Chest Port 1 View  Result Date: 08/22/2023 CLINICAL DATA:  Shortness of breath for several days. Wheezing. On home oxygen. EXAM: PORTABLE CHEST 1 VIEW  COMPARISON:  Radiographs 07/04/2023 and 07/02/2023.  CT 07/02/2023. FINDINGS: 1149 hours. Lordotic positioning. Stable cardiomegaly and mild bibasilar atelectasis. No edema, confluent airspace disease, pleural effusion or pneumothorax. The bones appear unchanged. Telemetry leads overlie the chest. IMPRESSION: Stable cardiomegaly and mild bibasilar atelectasis. No acute cardiopulmonary process. Electronically Signed   By: Carey Bullocks M.D.   On: 08/22/2023 13:38    ECHO 11/2022:  1. Left ventricular ejection fraction, by estimation, is 45 to 50%. The left ventricle has mildly decreased function. The left ventricle has no regional wall motion abnormalities. The left ventricular internal cavity size was mildly dilated. Left ventricular diastolic parameters were normal.   2. Right ventricular systolic function is normal. The right ventricular  size is normal. There is mildly elevated pulmonary artery systolic  pressure.   3. The mitral valve is normal in structure. Moderate mitral valve  regurgitation. No evidence of mitral stenosis.   4. Tricuspid valve regurgitation is moderate.   5. The aortic valve is normal in structure. Aortic valve regurgitation is  not visualized. No aortic stenosis is present.   6. The inferior vena cava is  normal in size with greater than 50%  respiratory variability, suggesting right atrial pressure of 3 mmHg.   TELEMETRY reviewed by me 08/30/2023: atrial fibrillation with PVCs rate 70-80s, short 2 sec pause overnight  EKG reviewed by me: AF RVR 149 bpm  Data reviewed by me 08/30/2023: last 24h vitals tele labs imaging I/O hospitalist progress note  Principal Problem:   COPD with acute exacerbation (HCC) Active Problems:   Paroxysmal atrial fibrillation with RVR (HCC)   Chronic combined systolic and diastolic heart failure (HCC)   HTN (hypertension)   Chronic hypoxic respiratory failure (HCC)   COPD exacerbation (HCC)    ASSESSMENT AND PLAN:  Kenneth Benson is a 62 y.o. 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 Cardiology was consulted for further evaluation.   # Atrial fibrillation RVR # Longstanding, persistent atrial fibrillation Patient presenting with shortness of breath and palpitations found to be in atrial fibrillation RVR on EKG in the setting of acute COPD exacerbation. -Given end-stage COPD and poor candidacy for antiarrhythmic medications, we will plan for rate control strategy.  Defer consideration for cardioversion given patient's longstanding persistent atrial fibrillation, even if he converted to NSR with cardioversion he likely would not remain in rhythm. -Continue po digoxin 0.125 mg daily. Switch diltiazem to 120 mg daily later today.  Continue metoprolol tartrate 50 mg twice daily. -Continue Eliquis 5 mg twice daily for stroke risk reduction.  # End-stage COPD # Chronic respiratory failure # COPD exacerbation Patient with chronic respiratory failure, end-stage COPD followed by pulmonology outpatient with multiple recent exacerbations. Initially required 4-5 L supplemental O2 now weaned to 2 L (baseline requirement). -Management  per primary.  # Chronic HFmrEF # Hypotension Patient with hx of heart failure with mildly reduced EF 45-50% on echo 11/2022.  -Continue midodrine for BP support. -Continue jardiance 10 mg daily. Home torsemide and spironolactone held due to hypotension.   This patient's plan of care was discussed and created with Dr. Corky Sing and he is in agreement.  Signed: Gale Journey, PA-C  08/30/2023, 10:03 AM [facility] Cardiology

## 2023-08-30 NOTE — Plan of Care (Signed)
  Problem: Education: Goal: Knowledge of disease or condition will improve Outcome: Progressing   Problem: Respiratory: Goal: Ability to maintain a clear airway will improve Outcome: Progressing   Problem: Clinical Measurements: Goal: Cardiovascular complication will be avoided Outcome: Progressing   Problem: Activity: Goal: Risk for activity intolerance will decrease Outcome: Progressing   Problem: Elimination: Goal: Will not experience complications related to urinary retention Outcome: Progressing   Problem: Pain Managment: Goal: General experience of comfort will improve Outcome: Progressing

## 2023-08-31 DIAGNOSIS — J441 Chronic obstructive pulmonary disease with (acute) exacerbation: Secondary | ICD-10-CM | POA: Diagnosis not present

## 2023-08-31 MED ORDER — METHYLPREDNISOLONE SODIUM SUCC 40 MG IJ SOLR: 40.0000 mg | INTRAMUSCULAR | Status: DC

## 2023-08-31 MED ORDER — DILTIAZEM HCL 30 MG PO TABS
60.0000 mg | ORAL_TABLET | Freq: Once | ORAL | Status: AC
  Administered 2023-08-31: 60 mg via ORAL
  Filled 2023-08-31: qty 2

## 2023-08-31 MED ORDER — DILTIAZEM HCL ER COATED BEADS 180 MG PO CP24
180.0000 mg | ORAL_CAPSULE | Freq: Every day | ORAL | Status: DC
  Administered 2023-09-01: 180 mg via ORAL
  Filled 2023-08-31: qty 1

## 2023-08-31 NOTE — Progress Notes (Addendum)
PROGRESS NOTE    Kenneth Benson  ZOX:[id number] DOB: [birth year] DOA: 08/22/2023 PCP: Pcp, No  Assessment & Plan:   Principal Problem:   COPD with acute exacerbation (HCC) Active Problems:   Paroxysmal atrial fibrillation with RVR (HCC)   Chronic hypoxic respiratory failure (HCC)   Chronic combined systolic and diastolic heart failure (HCC)   HTN (hypertension)   COPD exacerbation (HCC)  Assessment and Plan: COPD exacerbation: w/ hx of severe end stage COPD. Restarted IV steroid course w/ some improvement today. Completed 5 days of azithromycin. COVID19 was neg. Bronchodilators prn    Persistent a. fib: continue on diltiazem, metoprolol, digoxin, & eliquis as per cardio. Cardio signed off 08/31/23   Acute on chronic hypoxic respiratory failure: on 2L O2 at baseline. Continue on supplemental oxygen    Chronic combined CHF: appears euvolemic. Monitor I/Os. Holding torsemide, aldactone. Cardio signed off 08/31/23    HTN: BP on low end b/c of metoprolol, diltiazem but needed for a. fib   Hypotension: continue on midodrine      DVT prophylaxis: eliquis Code Status: full  Family Communication: Disposition Plan: likely d/c back home  Level of care: Telemetry Cardiac Consultants:    Procedures:   Antimicrobials:    Subjective: Pt c/o shortness of breath, improved from day prior   Objective: Vitals:   08/31/23 0300 08/31/23 0400 08/31/23 0700 08/31/23 0808  BP:    112/88  Pulse: 97   (!) 106  Resp: 20 20    Temp: (!) 97.5 F (36.4 C) 97.6 F (36.4 C) 97.8 F (36.6 C)   TempSrc: Oral Oral    SpO2: 93%     Weight:      Height:        Intake/Output Summary (Last 24 hours) at 08/31/2023 0855 Last data filed at 08/31/2023 0730 Gross per 24 hour  Intake 1435 ml  Output 4145 ml  Net -2710 ml   Filed Weights   08/22/23 1046 08/28/23 0525  Weight: 122.5 kg 130.9 kg    Examination:  General exam: appears calm & comfortable  Respiratory system: course breath  sounds b/l. Scattered wheezes b/l   Cardiovascular system: S1 & S2+. No rubs or gallops  Gastrointestinal system: abd is soft, NT, obese & hypoactive bowel sounds  Central nervous system: alert & oriented. Moves all extremities  Psychiatry: Judgement and insight appears at baseline. Flat mood and affect    Data Reviewed: I have personally reviewed following labs and imaging studies  CBC: Recent Labs  Lab 08/27/23 0621 08/28/23 0521 08/30/23 0454  WBC 8.1 8.0 7.4  HGB 14.4 14.4 15.3  HCT 44.9 44.3 48.3  MCV 92.2 90.6 92.7  PLT 199 217 218   Basic Metabolic Panel: Recent Labs  Lab 08/27/23 0621 08/28/23 0521 08/30/23 0454  NA 142 139 140  K 4.2 4.2 4.6  CL 98 96* 98  CO2 39* 35* 37*  GLUCOSE 91 94 104*  BUN 24* 24* 24*  CREATININE 0.78 0.76 0.87  CALCIUM 8.7* 8.5* 8.5*  MG 2.4 2.2 2.3   GFR: Estimated Creatinine Clearance: 126.3 mL/min (by C-G formula based on SCr of 0.87 mg/dL). Liver Function Tests: No results for input(s): "AST", "ALT", "ALKPHOS", "BILITOT", "PROT", "ALBUMIN" in the last 168 hours. No results for input(s): "LIPASE", "AMYLASE" in the last 168 hours. No results for input(s): "AMMONIA" in the last 168 hours. Coagulation Profile: No results for input(s): "INR", "PROTIME" in the last 168 hours. Cardiac Enzymes: No results for input(s): "CKTOTAL", "CKMB", "  CKMBINDEX", "TROPONINI" in the last 168 hours. BNP (last 3 results) No results for input(s): "PROBNP" in the last 8760 hours. HbA1C: No results for input(s): "HGBA1C" in the last 72 hours. CBG: No results for input(s): "GLUCAP" in the last 168 hours. Lipid Profile: No results for input(s): "CHOL", "HDL", "LDLCALC", "TRIG", "CHOLHDL", "LDLDIRECT" in the last 72 hours. Thyroid Function Tests: No results for input(s): "TSH", "T4TOTAL", "FREET4", "T3FREE", "THYROIDAB" in the last 72 hours. Anemia Panel: No results for input(s): "VITAMINB12", "FOLATE", "FERRITIN", "TIBC", "IRON", "RETICCTPCT" in the  last 72 hours. Sepsis Labs: No results for input(s): "PROCALCITON", "LATICACIDVEN" in the last 168 hours.  Recent Results (from the past 240 hour(s))  SARS Coronavirus 2 by RT PCR (hospital order, performed in [facility] hospital lab) *cepheid single result test* Anterior Nasal Swab     Status: None   Collection Time: 08/22/23 11:30 AM   Specimen: Anterior Nasal Swab  Result Value Ref Range Status   SARS Coronavirus 2 by RT PCR NEGATIVE NEGATIVE Final    Comment: (NOTE) SARS-CoV-2 target nucleic acids are NOT DETECTED.  The SARS-CoV-2 RNA is generally detectable in upper and lower respiratory specimens during the acute phase of infection. The lowest concentration of SARS-CoV-2 viral copies this assay can detect is 250 copies / mL. A negative result does not preclude SARS-CoV-2 infection and should not be used as the sole basis for treatment or other patient management decisions.  A negative result may occur with improper specimen collection / handling, submission of specimen other than nasopharyngeal swab, presence of viral mutation(s) within the areas targeted by this assay, and inadequate number of viral copies (<250 copies / mL). A negative result must be combined with clinical observations, patient history, and epidemiological information.  Fact Sheet for Patients:   RoadLapTop.co.za  Fact Sheet for Healthcare Providers: http://kim-miller.com/  This test is not yet approved or  cleared by the [place] FDA and has been authorized for detection and/or diagnosis of SARS-CoV-2 by FDA under an Emergency Use Authorization (EUA).  This EUA will remain in effect (meaning this test can be used) for the duration of the COVID-19 declaration under Section 564(b)(1) of the Act, 21 U.S.C. section 360bbb-3(b)(1), unless the authorization is terminated or revoked sooner.  Performed at [facility], [address]  [place], [place] [address]   Respiratory (~20 pathogens) panel by PCR     Status: None   Collection Time: 08/24/23 10:29 AM   Specimen: Nasopharyngeal Swab; Respiratory  Result Value Ref Range Status   Adenovirus NOT DETECTED NOT DETECTED Final   Coronavirus 229E NOT DETECTED NOT DETECTED Final    Comment: (NOTE) The Coronavirus on the Respiratory Panel, DOES NOT test for the novel  Coronavirus (2019 nCoV)    Coronavirus HKU1 NOT DETECTED NOT DETECTED Final   Coronavirus NL63 NOT DETECTED NOT DETECTED Final   Coronavirus OC43 NOT DETECTED NOT DETECTED Final   Metapneumovirus NOT DETECTED NOT DETECTED Final   Rhinovirus / Enterovirus NOT DETECTED NOT DETECTED Final   Influenza A NOT DETECTED NOT DETECTED Final   Influenza B NOT DETECTED NOT DETECTED Final   Parainfluenza Virus 1 NOT DETECTED NOT DETECTED Final   Parainfluenza Virus 2 NOT DETECTED NOT DETECTED Final   Parainfluenza Virus 3 NOT DETECTED NOT DETECTED Final   Parainfluenza Virus 4 NOT DETECTED NOT DETECTED Final   Respiratory Syncytial Virus NOT DETECTED NOT DETECTED Final   Bordetella pertussis NOT DETECTED NOT DETECTED Final   Bordetella Parapertussis NOT DETECTED NOT  DETECTED Final   Chlamydophila pneumoniae NOT DETECTED NOT DETECTED Final   Mycoplasma pneumoniae NOT DETECTED NOT DETECTED Final    Comment: Performed at [facility], [address] [place], [place] [address]         Radiology Studies: No results found.      Scheduled Meds:  apixaban  5 mg Oral BID   digoxin  0.125 mg Oral Daily   [START ON 09/01/2023] diltiazem  180 mg Oral Daily   diltiazem  60 mg Oral Once   ipratropium  0.5 mg Nebulization Q6H   levalbuterol  1.25 mg Nebulization Q6H   methylPREDNISolone (SOLU-MEDROL) injection  40 mg Intravenous BID   metoprolol tartrate  50 mg Oral BID   midodrine  10 mg Oral TID WC   Continuous Infusions:   LOS: 8 days       Charise Killian, MD [facility] Pager [phone]  [phone]  If 7PM-7AM, please contact night-coverage [email or web address] 08/31/2023, 8:55 AM

## 2023-08-31 NOTE — Progress Notes (Signed)
[facility] CARDIOLOGY CONSULT NOTE       Patient ID: Kenneth Benson MRN: [id number] DOB/AGE: [birth year] 62 y.o.  Admit date: 08/22/2023 Referring Physician Dr. Darlin Priestly Primary Physician none Primary Cardiologist Kenneth Benson (last seen 02/2022) Reason for Consultation atrial fibrillation RVR  HPI: Kenneth Benson is a 62 y.o. 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 Cardiology was consulted for further evaluation.   Interval history:  -Patient feeling well this AM, had just received his nebulizer treatment and states his breathing feels great.  -HR borderline, rates in the 90-100s overnight. Elevated to 110s after nebs. No pauses noted. -BP remains stable, he is without dizziness, chest pain, palpitations.   Review of systems complete and found to be negative unless listed above    Past Medical History:  Diagnosis Date   A-fib (HCC) 02/06/2022   AKI (acute kidney injury) (HCC) 12/17/2022   Arrhythmia    atrial fibrillation   Asthma    CHF (congestive heart failure) (HCC)    COPD (chronic obstructive pulmonary disease) (HCC)    Hyperkalemia 12/17/2022   Hypokalemia 04/13/2023   Myocardial injury 04/13/2023   Tobacco abuse     History reviewed. No pertinent surgical history.  Medications Prior to Admission  Medication Sig Dispense Refill Last Dose   albuterol (PROVENTIL) (2.5 MG/3ML) 0.083% nebulizer solution Take 3 mLs (2.5 mg total) by nebulization every 6 (six) hours as needed for wheezing or shortness of breath. 360 mL 1 08/21/2023   albuterol (VENTOLIN HFA) 108 (90 Base) MCG/ACT inhaler Inhale 2 puffs into the lungs every 6 (six) hours as needed for wheezing or shortness of breath. 18 g 1 08/21/2023   apixaban (ELIQUIS) 5 MG TABS tablet Take 1 tablet (5 mg total) by mouth 2 (two) times daily. 60 tablet 3 08/22/2023   arformoterol (BROVANA)  15 MCG/2ML NEBU Take 2 mLs (15 mcg total) by nebulization 2 (two) times daily. 120 mL 2 08/22/2023   diltiazem (CARDIZEM CD) 240 MG 24 hr capsule Take 1 capsule (240 mg total) by mouth daily. 30 capsule 3 08/22/2023   JARDIANCE 10 MG TABS tablet Take 1 tablet (10 mg total) by mouth daily. 30 tablet 3 08/22/2023   spironolactone (ALDACTONE) 25 MG tablet Take 0.5 tablets (12.5 mg total) by mouth daily. 30 tablet 3 08/22/2023   torsemide (DEMADEX) 20 MG tablet Take 2 tablets (40 mg total) by mouth daily. 60 tablet 1 08/22/2023   metoprolol tartrate (LOPRESSOR) 50 MG tablet Take 1 tablet (50 mg total) by mouth 2 (two) times daily. (Patient not taking: Reported on 08/22/2023) 60 tablet 3 Not Taking   Social History   Socioeconomic History   Marital status: Single    Spouse name: Not on file   Number of children: Not on file   Years of education: Not on file   Highest education level: Not on file  Occupational History   Not on file  Tobacco Use   Smoking status: Every Day    Current packs/day: 1.50    Average packs/day: 1.5 packs/day for 27.0 years (40.5 ttl pk-yrs)    Types: Cigarettes   Smokeless tobacco: Not on file   Tobacco comments:    0.5PPD at the most.  trying to quit.  05/11/2023 hfb  Vaping Use   Vaping status: Never Used  Substance and Sexual Activity   Alcohol use: No  Drug use: No   Sexual activity: Not on file  Other Topics Concern   Not on file  Social History Narrative   Not on file   Social Determinants of Health   Financial Resource Strain: Not on file  Food Insecurity: No Food Insecurity (08/22/2023)   Hunger Vital Sign    Worried About Running Out of Food in the Last Year: Never true    Ran Out of Food in the Last Year: Never true  Transportation Needs: No Transportation Needs (08/22/2023)   PRAPARE - Administrator, Civil Service (Medical): No    Lack of Transportation (Non-Medical): No  Physical Activity: Not on file  Stress: Not on file  Social  Connections: Not on file  Intimate Partner Violence: Not At Risk (08/22/2023)   Humiliation, Afraid, Rape, and Kick questionnaire    Fear of Current or Ex-Partner: No    Emotionally Abused: No    Physically Abused: No    Sexually Abused: No    Family History  Problem Relation Age of Onset   Heart disease Mother    Atrial fibrillation Father      Vitals:   08/31/23 0400 08/31/23 0700 08/31/23 0808 08/31/23 1005  BP:   112/88 118/78  Pulse:   (!) 106   Resp: 20     Temp: 97.6 F (36.4 C) 97.8 F (36.6 C)    TempSrc: Oral     SpO2:      Weight:      Height:        PHYSICAL EXAM General: Chronically ill-appearing male, well nourished, in no acute distress laying nearly flat in hospital bed. HEENT: Normocephalic and atraumatic. Neck: No JVD.  Lungs: Normal respiratory effort on 2L. Clear bilaterally to auscultation. No wheezes, crackles, rhonchi.  Heart: Irregularly irregular, controlled rate. Normal S1 and S2 without gallops or murmurs.  Abdomen: Non-distended appearing.  Msk: Normal strength and tone for age. Extremities: Warm and well perfused. No clubbing, cyanosis. No edema.  Neuro: Alert and oriented X 3. Psych: Answers questions appropriately.   Labs: Basic Metabolic Panel: Recent Labs    08/30/23 0454  NA 140  K 4.6  CL 98  CO2 37*  GLUCOSE 104*  BUN 24*  CREATININE 0.87  CALCIUM 8.5*  MG 2.3   Liver Function Tests: No results for input(s): "AST", "ALT", "ALKPHOS", "BILITOT", "PROT", "ALBUMIN" in the last 72 hours. No results for input(s): "LIPASE", "AMYLASE" in the last 72 hours. CBC: Recent Labs    08/30/23 0454  WBC 7.4  HGB 15.3  HCT 48.3  MCV 92.7  PLT 218   Cardiac Enzymes: No results for input(s): "CKTOTAL", "CKMB", "CKMBINDEX", "TROPONINIHS" in the last 72 hours. BNP: No results for input(s): "BNP" in the last 72 hours. D-Dimer: No results for input(s): "DDIMER" in the last 72 hours. Hemoglobin A1C: No results for input(s): "HGBA1C"  in the last 72 hours. Fasting Lipid Panel: No results for input(s): "CHOL", "HDL", "LDLCALC", "TRIG", "CHOLHDL", "LDLDIRECT" in the last 72 hours. Thyroid Function Tests: No results for input(s): "TSH", "T4TOTAL", "T3FREE", "THYROIDAB" in the last 72 hours.  Invalid input(s): "FREET3" Anemia Panel: No results for input(s): "VITAMINB12", "FOLATE", "FERRITIN", "TIBC", "IRON", "RETICCTPCT" in the last 72 hours.   Radiology: [facility] Chest Port 1 View  Result Date: 08/22/2023 CLINICAL DATA:  Shortness of breath for several days. Wheezing. On home oxygen. EXAM: PORTABLE CHEST 1 VIEW COMPARISON:  Radiographs 07/04/2023 and 07/02/2023.  CT 07/02/2023. FINDINGS: 1149 hours. Lordotic positioning. Stable  cardiomegaly and mild bibasilar atelectasis. No edema, confluent airspace disease, pleural effusion or pneumothorax. The bones appear unchanged. Telemetry leads overlie the chest. IMPRESSION: Stable cardiomegaly and mild bibasilar atelectasis. No acute cardiopulmonary process. Electronically Signed   By: Carey Bullocks M.D.   On: 08/22/2023 13:38    ECHO 11/2022:  1. Left ventricular ejection fraction, by estimation, is 45 to 50%. The left ventricle has mildly decreased function. The left ventricle has no regional wall motion abnormalities. The left ventricular internal cavity size was mildly dilated. Left ventricular diastolic parameters were normal.   2. Right ventricular systolic function is normal. The right ventricular  size is normal. There is mildly elevated pulmonary artery systolic  pressure.   3. The mitral valve is normal in structure. Moderate mitral valve  regurgitation. No evidence of mitral stenosis.   4. Tricuspid valve regurgitation is moderate.   5. The aortic valve is normal in structure. Aortic valve regurgitation is  not visualized. No aortic stenosis is present.   6. The inferior vena cava is normal in size with greater than 50%  respiratory variability, suggesting right atrial  pressure of 3 mmHg.   TELEMETRY reviewed by me 08/31/2023: atrial fibrillation with PVCs rate 90-100s  EKG reviewed by me: AF RVR 149 bpm  Data reviewed by me 08/31/2023: last 24h vitals tele labs imaging I/O hospitalist progress note  Principal Problem:   COPD with acute exacerbation (HCC) Active Problems:   Paroxysmal atrial fibrillation with RVR (HCC)   Chronic combined systolic and diastolic heart failure (HCC)   HTN (hypertension)   Chronic hypoxic respiratory failure (HCC)   COPD exacerbation (HCC)    ASSESSMENT AND PLAN:  Kenneth Benson is a 62 y.o. 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 Cardiology was consulted for further evaluation.   # Atrial fibrillation RVR # Longstanding, persistent atrial fibrillation Patient presenting with shortness of breath and palpitations found to be in atrial fibrillation RVR on EKG in the setting of acute COPD exacerbation. Rate better controlled, overall difficult to control given significant COPD. Rate also will be elevated after receiving nebulizer treatments. -Given end-stage COPD and poor candidacy for antiarrhythmic medications, we will plan for rate control strategy.  Defer consideration for cardioversion given patient's longstanding persistent atrial fibrillation, even if he converted to NSR with cardioversion he likely would not remain in rhythm. -Increase diltiazem to 180 mg daily. Continue po digoxin 0.125 mg daily, metoprolol tartrate 50 mg twice daily. -Continue Eliquis 5 mg twice daily for stroke risk reduction.  # End-stage COPD # Chronic respiratory failure # COPD exacerbation Patient with chronic respiratory failure, end-stage COPD followed by pulmonology outpatient with multiple recent exacerbations. Initially required 4-5 L supplemental O2 now weaned to 2 L (baseline  requirement). -Management per primary.  # Chronic HFmrEF # Hypotension Patient with hx of heart failure with mildly reduced EF 45-50% on echo 11/2022.  -Continue midodrine for BP support. -Home torsemide, jardiance, and spironolactone held due to hypotension.   No further recommendations from cardiac perspective. Cardiology will sign off. Please haiku with questions or re-engage if needed.    This patient's plan of care was discussed and created with Dr. Corky Sing and he is in agreement.  Signed: Gale Journey, PA-C  08/31/2023, 10:23 AM [facility]

## 2023-08-31 NOTE — Consult Note (Signed)
PULMONOLOGY         Date: 08/31/2023,   MRN# [id number] Kenneth Benson [birth year]     AdmissionWeight: 122.5 kg                 CurrentWeight: 130.9 kg  Referring provider: Dr Mayford Knife   CHIEF COMPLAINT:   Recurrent Severe Exacerbation of COPD   HISTORY OF PRESENT ILLNESS   Kenneth Benson is a 62 y.o. 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 He has been experiencing gradually worsening shortness of breath and feeling that his lungs are tight.  He has had recurrent admissions for COPD and respiratory failure. In addition, he seems that mucus accumulation has been recurring faster and is more difficult for him to clear but otherwise, he does not feel that he is coughing more.  In addition, he had some chest pain and back pain when he takes deep breaths but it does not occur when he is not deep breathing.  He denies any fever, chills, nausea, vomiting, diarrhea, palpitations. On arrival to the ED, patient was normotensive at 122/76 with heart rate of 154.  He was saturating at 95% on 4 L and was subsequently weaned to his home 2 L.  He was tachypneic up to 32/min.  He was afebrile at 98.3. Initial workup notable for unremarkable CBC, troponin, and BNP notable only for glucose of 106 and BUN of 28.  Renal function intact with GFR above 60.  COVID-19 PCR negative.  Chest x-ray with no acute changes.  EKG obtained and notable for atrial fibrillation with RVR.  Despite maxima medical management patient is minimally improved.    PAST MEDICAL HISTORY   Past Medical History:  Diagnosis Date   A-fib (HCC) 02/06/2022   AKI (acute kidney injury) (HCC) 12/17/2022   Arrhythmia    atrial fibrillation   Asthma    CHF (congestive heart failure) (HCC)    COPD (chronic obstructive pulmonary disease) (HCC)     Hyperkalemia 12/17/2022   Hypokalemia 04/13/2023   Myocardial injury 04/13/2023   Tobacco abuse      SURGICAL HISTORY   History reviewed. No pertinent surgical history.   FAMILY HISTORY   Family History  Problem Relation Age of Onset   Heart disease Mother    Atrial fibrillation Father      SOCIAL HISTORY   Social History   Tobacco Use   Smoking status: Every Day    Current packs/day: 1.50    Average packs/day: 1.5 packs/day for 27.0 years (40.5 ttl pk-yrs)    Types: Cigarettes   Tobacco comments:    0.5PPD at the most.  trying to quit.  05/11/2023 hfb  Vaping Use   Vaping status: Never Used  Substance Use Topics   Alcohol use: No   Drug use: No     MEDICATIONS    Home Medication:    Current Medication:  Current Facility-Administered Medications:    acetaminophen (TYLENOL) tablet 650 mg, 650 mg, Oral, Q6H PRN, 650 mg at 08/25/23 1835 **OR** acetaminophen (TYLENOL) suppository 650 mg, 650 mg, Rectal, Q6H PRN, Verdene Lennert, MD   albuterol (PROVENTIL) (2.5 MG/3ML) 0.083% nebulizer solution 2.5 mg, 2.5 mg, Nebulization, Q2H PRN, Lindajo Royal V, MD, 2.5 mg at 08/30/23 0454   ALPRAZolam (XANAX) tablet 0.25 mg, 0.25 mg, Oral, BID  PRN, Charise Killian, MD   apixaban Everlene Balls) tablet 5 mg, 5 mg, Oral, BID, Verdene Lennert, MD, 5 mg at 08/31/23 0808   butalbital-acetaminophen-caffeine (FIORICET) 50-325-40 MG per tablet 2 tablet, 2 tablet, Oral, BID PRN, Darlin Priestly, MD   digoxin Margit Banda) tablet 0.125 mg, 0.125 mg, Oral, Daily, Hudson, Caralyn, PA-C, 0.125 mg at 08/31/23 0808   [START ON 09/01/2023] diltiazem (CARDIZEM CD) 24 hr capsule 180 mg, 180 mg, Oral, Daily, Hudson, Caralyn, PA-C   guaiFENesin-dextromethorphan (ROBITUSSIN DM) 100-10 MG/5ML syrup 5 mL, 5 mL, Oral, Q4H PRN, Darlin Priestly, MD, 5 mL at 08/30/23 1815   ipratropium (ATROVENT) nebulizer solution 0.5 mg, 0.5 mg, Nebulization, Q6H, Charise Killian, MD, 0.5 mg at 08/31/23 1349   levalbuterol (XOPENEX)  nebulizer solution 1.25 mg, 1.25 mg, Nebulization, Q6H, Charise Killian, MD, 1.25 mg at 08/31/23 1349   menthol-cetylpyridinium (CEPACOL) lozenge 3 mg, 1 lozenge, Oral, PRN, Darlin Priestly, MD   methylPREDNISolone sodium succinate (SOLU-MEDROL) 40 mg/mL injection 40 mg, 40 mg, Intravenous, BID, Charise Killian, MD, 40 mg at 08/31/23 1607   metoprolol tartrate (LOPRESSOR) tablet 50 mg, 50 mg, Oral, BID, Darlin Priestly, MD, 50 mg at 08/31/23 0808   midodrine (PROAMATINE) tablet 10 mg, 10 mg, Oral, TID WC, Darlin Priestly, MD, 10 mg at 08/31/23 1256   ondansetron (ZOFRAN) tablet 4 mg, 4 mg, Oral, Q6H PRN **OR** ondansetron (ZOFRAN) injection 4 mg, 4 mg, Intravenous, Q6H PRN, Verdene Lennert, MD   Oral care mouth rinse, 15 mL, Mouth Rinse, PRN, Darlin Priestly, MD    ALLERGIES   Patient has no known allergies.     REVIEW OF SYSTEMS    Review of Systems:  Gen:  Denies  fever, sweats, chills weigh loss  HEENT: Denies blurred vision, double vision, ear pain, eye pain, hearing loss, nose bleeds, sore throat Cardiac:  No dizziness, chest pain or heaviness, chest tightness,edema Resp:   reports dyspnea chronically  Gi: Denies swallowing difficulty, stomach pain, nausea or vomiting, diarrhea, constipation, bowel incontinence Gu:  Denies bladder incontinence, burning urine Ext:   Denies Joint pain, stiffness or swelling Skin: Denies  skin rash, easy bruising or bleeding or hives Endoc:  Denies polyuria, polydipsia , polyphagia or weight change Psych:   Denies depression, insomnia or hallucinations   Other:  All other systems negative   VS: BP 113/69   Pulse 95   Temp 97.9 F (36.6 C)   Resp (!) 25   Ht 6' (1.829 m)   Wt 130.9 kg   SpO2 92%   BMI 39.14 kg/m      PHYSICAL EXAM    GENERAL:NAD, no fevers, chills, no weakness no fatigue HEAD: Normocephalic, atraumatic.  EYES: Pupils equal, round, reactive to light. Extraocular muscles intact. No scleral icterus.  MOUTH: Moist mucosal  membrane. Dentition intact. No abscess noted.  EAR, NOSE, THROAT: Clear without exudates. No external lesions.  NECK: Supple. No thyromegaly. No nodules. No JVD.  PULMONARY: decreased breath sounds with mild rhonchi worse at bases bilaterally.  CARDIOVASCULAR: S1 and S2. Regular rate and rhythm. No murmurs, rubs, or gallops. No edema. Pedal pulses 2+ bilaterally.  GASTROINTESTINAL: Soft, nontender, nondistended. No masses. Positive bowel sounds. No hepatosplenomegaly.  MUSCULOSKELETAL: No swelling, clubbing, or edema. Range of motion full in all extremities.  NEUROLOGIC: Cranial nerves II through XII are intact. No gross focal neurological deficits. Sensation intact. Reflexes intact.  SKIN: No ulceration, lesions, rashes, or cyanosis. Skin warm and dry. Turgor intact.  PSYCHIATRIC: Mood, affect within  normal limits. The patient is awake, alert and oriented x 3. Insight, judgment intact.       IMAGING     ASSESSMENT/PLAN    Acute on chronic hypoxemic respiratory failure - patient has had episodes of Afrvr  - has had hypoxemia <85% even with supplemental O2.    -patient is currently on maximal medical management with IV streroids solumedrol 125.  Completed 5 days of Zithromax  Has nebulizer medications and is on supplemental O2   Atrial fibrillation with rapid ventricular response   - patient on rate and rhythm control   -will dc midodrine due to arrythmogenic potential   -reducing BB due to bradycardia while awake, this may help his COPD as well.  Chronic hypoxemia    Continue supplemental O2   -encourage PT/OT     Thank you for allowing me to participate in the care of this patient.   Patient/Family are satisfied with care plan and all questions have been answered.    Provider disclosure: Patient with at least one acute or chronic illness or injury that poses a threat to life or bodily function and is being managed actively during this encounter.  All of the below  services have been performed independently by signing provider:  review of prior documentation from internal and or external health records.  Review of previous and current lab results.  Interview and comprehensive assessment during patient visit today. Review of current and previous chest radiographs/CT scans. Discussion of management and test interpretation with health care team and patient/family.   This document was prepared using Dragon voice recognition software and may include unintentional dictation errors.     [name], M.D.  Division of Pulmonary & Critical Care Medicine

## 2023-09-01 ENCOUNTER — Other Ambulatory Visit: Payer: Self-pay

## 2023-09-01 ENCOUNTER — Inpatient Hospital Stay: Payer: Medicaid Other

## 2023-09-01 DIAGNOSIS — J441 Chronic obstructive pulmonary disease with (acute) exacerbation: Secondary | ICD-10-CM | POA: Diagnosis not present

## 2023-09-01 LAB — BASIC METABOLIC PANEL
Anion gap: 8 (ref 5–15)
BUN: 26 mg/dL — ABNORMAL HIGH (ref 6–20)
CO2: 35 mmol/L — ABNORMAL HIGH (ref 22–32)
Calcium: 8.4 mg/dL — ABNORMAL LOW (ref 8.9–10.3)
Chloride: 100 mmol/L (ref 98–111)
Creatinine, Ser: 0.69 mg/dL (ref 0.61–1.24)
GFR, Estimated: >60 mL/min (ref >60)
Glucose, Bld: 133 mg/dL — ABNORMAL HIGH (ref 70–99)
Potassium: 4.4 mmol/L (ref 3.5–5.1)
Sodium: 143 mmol/L (ref 135–145)

## 2023-09-01 LAB — CBC
HCT: 45.9 % (ref 39.0–52.0)
Hemoglobin: 15.1 g/dL (ref 13.0–17.0)
MCH: 29.9 pg (ref 26.0–34.0)
MCHC: 32.9 g/dL (ref 30.0–36.0)
MCV: 90.9 fL (ref 80.0–100.0)
Platelets: 204 10*3/uL (ref 150–400)
RBC: 5.05 MIL/uL (ref 4.22–5.81)
RDW: 19 % — ABNORMAL HIGH (ref 11.5–15.5)
WBC: 13.4 10*3/uL — ABNORMAL HIGH (ref 4.0–10.5)
nRBC: 0 % (ref 0.0–0.2)

## 2023-09-01 LAB — MAGNESIUM: Magnesium: 2.2 mg/dL (ref 1.7–2.4)

## 2023-09-01 MED ORDER — DIGOXIN 125 MCG PO TABS
0.1250 mg | ORAL_TABLET | Freq: Every day | ORAL | 0 refills | Status: DC
  Filled 2023-09-01: qty 30, 30d supply, fill #0

## 2023-09-01 MED ORDER — DILTIAZEM HCL ER COATED BEADS 180 MG PO CP24
180.0000 mg | ORAL_CAPSULE | Freq: Every day | ORAL | 0 refills | Status: DC
  Filled 2023-09-01: qty 30, 30d supply, fill #0

## 2023-09-01 MED ORDER — METOPROLOL TARTRATE 50 MG PO TABS
50.0000 mg | ORAL_TABLET | Freq: Two times a day (BID) | ORAL | 0 refills | Status: DC
  Filled 2023-09-01 – 2024-02-20 (×2): qty 60, 30d supply, fill #0

## 2023-09-01 MED ORDER — PREDNISONE 50 MG PO TABS: 50.0000 mg | ORAL_TABLET | Freq: Every day | ORAL | Status: DC

## 2023-09-01 MED ORDER — TORSEMIDE 20 MG PO TABS: 40.0000 mg | ORAL_TABLET | Freq: Every day | ORAL | Status: DC

## 2023-09-01 MED ORDER — SPIRONOLACTONE 25 MG PO TABS: 12.5000 mg | ORAL_TABLET | Freq: Every day | ORAL | Status: DC

## 2023-09-01 MED ORDER — PREDNISONE 5 MG PO TABS
ORAL_TABLET | ORAL | 0 refills | Status: AC
  Filled 2023-09-01: qty 55, 10d supply, fill #0

## 2023-09-01 NOTE — Discharge Summary (Signed)
Physician Discharge Summary  Kenneth Benson WGN:[id number] DOB: 07-17-1961 DOA: 08/22/2023  PCP: Oneita Hurt, No  Admit date: 08/22/2023 Discharge date: 09/01/2023  Admitted From: home Disposition:  home  Recommendations for Outpatient Follow-up:  Follow up with PCP in 1-2 weeks F/u w/ pulmon, Dr. Karna Christmas, in 3-4 days F/u w/ cardio, Dr. Corky Sing, in 1-2 weeks   Home Health: no  Equipment/Devices:  Discharge Condition: stable  CODE STATUS: full  Diet recommendation: Heart Healthy  Brief/Interim Summary: HPI was taken from Dr. Huel Cote: Kenneth Benson is a 62 y.o. 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   [name] states that over the last few days, he has been experiencing gradually worsening shortness of breath and feeling that his lungs are tight.  In addition, he seems that mucus accumulation has been recurring faster and is more difficult for him to clear but otherwise, he does not feel that he is coughing more.  In addition, he had some chest pain and back pain when he takes deep breaths but it does not occur when he is not deep breathing.  He denies any fever, chills, nausea, vomiting, diarrhea, palpitations.   ED course: On arrival to the ED, patient was normotensive at 122/76 with heart rate of 154.  He was saturating at 95% on 4 L and was subsequently weaned to his home 2 L.  He was tachypneic up to 32/min.  He was afebrile at 98.3. Initial workup notable for unremarkable CBC, troponin, and BNP notable only for glucose of 106 and BUN of 28.  Renal function intact with GFR above 60.  COVID-19 PCR negative.  Chest x-ray with no acute changes.  EKG obtained and notable for atrial fibrillation with RVR.  Patient started on Dilt infusion and COPD exacerbation treatment with DuoNebs and Solu-Medrol.  TRH contacted  for admission.  Discharge Diagnoses:  Principal Problem:   COPD with acute exacerbation (HCC) Active Problems:   Paroxysmal atrial fibrillation with RVR (HCC)   Chronic hypoxic respiratory failure (HCC)   Chronic combined systolic and diastolic heart failure (HCC)   HTN (hypertension)   COPD exacerbation (HCC)  COPD exacerbation: w/ hx of severe end stage COPD. Continue on steroids and start taper. Completed 5 days of azithromycin. COVID19 was neg. Bronchodilators prn. Pulmon consulted and recs apprecs    Persistent a. fib: continue on diltiazem, metoprolol, digoxin, & eliquis as per cardio. Cardio signed off 08/31/23    Acute on chronic hypoxic respiratory failure: on 2L O2 at baseline. Continue on supplemental oxygen    Chronic combined CHF: appears euvolemic. Monitor I/Os. Holding torsemide, aldactone. Cardio signed off 08/31/23    HTN: BP on low end b/c of metoprolol, diltiazem but needed for a. fib   Hypotension: resolved   Discharge Instructions  Discharge Instructions     Diet - low sodium heart healthy   Complete by: As directed    Discharge instructions   Complete by: As directed    F/u w/ pulmon, Dr. Karna Christmas, in 3-4 days. F/u w/ cardio, Dr. Corky Sing, in 1-2 weeks. F/u w/ PCP in 1-2 weeks   Increase activity slowly   Complete by: As directed       Allergies as of 09/01/2023   No Known Allergies      Medication List     TAKE these medications  albuterol (2.5 MG/3ML) 0.083% nebulizer solution Commonly known as: PROVENTIL Take 3 mLs (2.5 mg total) by nebulization every 6 (six) hours as needed for wheezing or shortness of breath.   Ventolin HFA 108 (90 Base) MCG/ACT inhaler Generic drug: albuterol Inhale 2 puffs into the lungs every 6 (six) hours as needed for wheezing or shortness of breath.   arformoterol 15 MCG/2ML Nebu Commonly known as: Brovana Take 2 mLs (15 mcg total) by nebulization 2 (two) times daily.   digoxin 0.125 MG tablet Commonly known  as: LANOXIN Take 1 tablet (0.125 mg total) by mouth daily. Start taking on: September 02, 2023   diltiazem 180 MG 24 hr capsule Commonly known as: CARDIZEM CD Take 1 capsule (180 mg total) by mouth daily. Start taking on: September 02, 2023 What changed:  medication strength how much to take   Eliquis 5 MG Tabs tablet Generic drug: apixaban Take 1 tablet (5 mg total) by mouth 2 (two) times daily.   Jardiance 10 MG Tabs tablet Generic drug: empagliflozin Take 1 tablet (10 mg total) by mouth daily.   metoprolol tartrate 50 MG tablet Commonly known as: LOPRESSOR Take 1 tablet (50 mg total) by mouth 2 (two) times daily.   predniSONE 5 MG tablet Commonly known as: DELTASONE Take 10 tablets (50 mg total) by mouth daily for 1 day, THEN 9 tablets (45 mg total) daily for 1 day, THEN 8 tablets (40 mg total) daily for 1 day, THEN 7 tablets (35 mg total) daily for 1 day, THEN 6 tablets (30 mg total) daily for 1 day, THEN 5 tablets (25 mg total) daily for 1 day, THEN 4 tablets (20 mg total) daily for 1 day, THEN 3 tablets (15 mg total) daily for 1 day, THEN 2 tablets (10 mg total) daily for 1 day, THEN 1 tablet (5 mg total) daily for 1 day then stop. Start taking on: September 01, 2023   spironolactone 25 MG tablet Commonly known as: ALDACTONE Take 0.5 tablets (12.5 mg total) by mouth daily. Hold this medication until you see your cardiologist What changed: additional instructions   torsemide 20 MG tablet Commonly known as: DEMADEX Take 2 tablets (40 mg total) by mouth daily. Hold this medication until you see your cardiologist What changed: additional instructions        Follow-up Information     Alluri, Meryl Dare, MD. Go in 1 week(s).   Specialty: Cardiology Contact information: [address] [place] [address] [phone]         [name], MD Follow up.   Specialty: Pulmonary Disease Why: F/u in 3-4 days Contact information: [address] [place]  [place] [address] [phone]                No Known Allergies  Consultations: Pulmon  Cardio    Procedures/Studies: DG Chest Port 1 View  Result Date: 08/22/2023 CLINICAL DATA:  Shortness of breath for several days. Wheezing. On home oxygen. EXAM: PORTABLE CHEST 1 VIEW COMPARISON:  Radiographs 07/04/2023 and 07/02/2023.  CT 07/02/2023. FINDINGS: 1149 hours. Lordotic positioning. Stable cardiomegaly and mild bibasilar atelectasis. No edema, confluent airspace disease, pleural effusion or pneumothorax. The bones appear unchanged. Telemetry leads overlie the chest. IMPRESSION: Stable cardiomegaly and mild bibasilar atelectasis. No acute cardiopulmonary process. Electronically Signed   By: Carey Bullocks M.D.   On: 08/22/2023 13:38   (Echo, Carotid, EGD, Colonoscopy, ERCP)    Subjective: Pt c/o fatigue    Discharge Exam: Vitals:   09/01/23  0847 09/01/23 1158  BP:  (!) 120/94  Pulse: (!) 101 (!) 56  Resp:    Temp:  98.1 F (36.7 C)  SpO2:  96%   Vitals:   09/01/23 0751 09/01/23 0801 09/01/23 0847 09/01/23 1158  BP:  106/83  (!) 120/94  Pulse:   (!) 101 (!) 56  Resp:      Temp:  98.2 F (36.8 C)  98.1 F (36.7 C)  TempSrc:  Oral  Oral  SpO2: 91% 96%  96%  Weight:      Height:        General: Pt is alert, awake, not in acute distress Cardiovascular: irregularly irregular, no rubs, no gallops Respiratory: decreased breath sounds b/l. Scattered wheezes  Abdominal: Soft, NT, obese, bowel sounds + Extremities: no cyanosis    The results of significant diagnostics from this hospitalization (including imaging, microbiology, ancillary and laboratory) are listed below for reference.     Microbiology: Recent Results (from the past 240 hour(s))  Respiratory (~20 pathogens) panel by PCR     Status: None   Collection Time: 08/24/23 10:29 AM   Specimen: Nasopharyngeal Swab; Respiratory  Result Value Ref Range Status   Adenovirus NOT DETECTED NOT DETECTED Final    Coronavirus 229E NOT DETECTED NOT DETECTED Final    Comment: (NOTE) The Coronavirus on the Respiratory Panel, DOES NOT test for the novel  Coronavirus (2019 nCoV)    Coronavirus HKU1 NOT DETECTED NOT DETECTED Final   Coronavirus NL63 NOT DETECTED NOT DETECTED Final   Coronavirus OC43 NOT DETECTED NOT DETECTED Final   Metapneumovirus NOT DETECTED NOT DETECTED Final   Rhinovirus / Enterovirus NOT DETECTED NOT DETECTED Final   Influenza A NOT DETECTED NOT DETECTED Final   Influenza B NOT DETECTED NOT DETECTED Final   Parainfluenza Virus 1 NOT DETECTED NOT DETECTED Final   Parainfluenza Virus 2 NOT DETECTED NOT DETECTED Final   Parainfluenza Virus 3 NOT DETECTED NOT DETECTED Final   Parainfluenza Virus 4 NOT DETECTED NOT DETECTED Final   Respiratory Syncytial Virus NOT DETECTED NOT DETECTED Final   Bordetella pertussis NOT DETECTED NOT DETECTED Final   Bordetella Parapertussis NOT DETECTED NOT DETECTED Final   Chlamydophila pneumoniae NOT DETECTED NOT DETECTED Final   Mycoplasma pneumoniae NOT DETECTED NOT DETECTED Final    Comment: Performed at [facility], [address] [place], [place] [address]     Labs: BNP (last 3 results) Recent Labs    12/21/22 0328 04/13/23 1037 07/02/23 1543  BNP 1,985.5* 1,178.2* 661.0*   Basic Metabolic Panel: Recent Labs  Lab 08/27/23 0621 08/28/23 0521 08/30/23 0454 09/01/23 0508  NA 142 139 140 143  K 4.2 4.2 4.6 4.4  CL 98 96* 98 100  CO2 39* 35* 37* 35*  GLUCOSE 91 94 104* 133*  BUN 24* 24* 24* 26*  CREATININE 0.78 0.76 0.87 0.69  CALCIUM 8.7* 8.5* 8.5* 8.4*  MG 2.4 2.2 2.3 2.2   Liver Function Tests: No results for input(s): "AST", "ALT", "ALKPHOS", "BILITOT", "PROT", "ALBUMIN" in the last 168 hours. No results for input(s): "LIPASE", "AMYLASE" in the last 168 hours. No results for input(s): "AMMONIA" in the last 168 hours. CBC: Recent Labs  Lab 08/27/23 0621 08/28/23 0521 08/30/23 0454 09/01/23 0508  WBC 8.1 8.0 7.4  13.4*  HGB 14.4 14.4 15.3 15.1  HCT 44.9 44.3 48.3 45.9  MCV 92.2 90.6 92.7 90.9  PLT 199 217 218 204   Cardiac Enzymes: No results for input(s): "CKTOTAL", "CKMB", "CKMBINDEX", "  TROPONINI" in the last 168 hours. BNP: Invalid input(s): "POCBNP" CBG: No results for input(s): "GLUCAP" in the last 168 hours. D-Dimer No results for input(s): "DDIMER" in the last 72 hours. Hgb A1c No results for input(s): "HGBA1C" in the last 72 hours. Lipid Profile No results for input(s): "CHOL", "HDL", "LDLCALC", "TRIG", "CHOLHDL", "LDLDIRECT" in the last 72 hours. Thyroid function studies No results for input(s): "TSH", "T4TOTAL", "T3FREE", "THYROIDAB" in the last 72 hours.  Invalid input(s): "FREET3" Anemia work up No results for input(s): "VITAMINB12", "FOLATE", "FERRITIN", "TIBC", "IRON", "RETICCTPCT" in the last 72 hours. Urinalysis    Component Value Date/Time   COLORURINE YELLOW (A) 12/16/2022 1305   APPEARANCEUR CLEAR (A) 12/16/2022 1305   LABSPEC 1.026 12/16/2022 1305   PHURINE 7.0 12/16/2022 1305   GLUCOSEU NEGATIVE 12/16/2022 1305   HGBUR NEGATIVE 12/16/2022 1305   BILIRUBINUR NEGATIVE 12/16/2022 1305   KETONESUR NEGATIVE 12/16/2022 1305   PROTEINUR 30 (A) 12/16/2022 1305   NITRITE NEGATIVE 12/16/2022 1305   LEUKOCYTESUR NEGATIVE 12/16/2022 1305   Sepsis Labs Recent Labs  Lab 08/27/23 0621 08/28/23 0521 08/30/23 0454 09/01/23 0508  WBC 8.1 8.0 7.4 13.4*   Microbiology Recent Results (from the past 240 hour(s))  Respiratory (~20 pathogens) panel by PCR     Status: None   Collection Time: 08/24/23 10:29 AM   Specimen: Nasopharyngeal Swab; Respiratory  Result Value Ref Range Status   Adenovirus NOT DETECTED NOT DETECTED Final   Coronavirus 229E NOT DETECTED NOT DETECTED Final    Comment: (NOTE) The Coronavirus on the Respiratory Panel, DOES NOT test for the novel  Coronavirus (2019 nCoV)    Coronavirus HKU1 NOT DETECTED NOT DETECTED Final   Coronavirus NL63 NOT  DETECTED NOT DETECTED Final   Coronavirus OC43 NOT DETECTED NOT DETECTED Final   Metapneumovirus NOT DETECTED NOT DETECTED Final   Rhinovirus / Enterovirus NOT DETECTED NOT DETECTED Final   Influenza A NOT DETECTED NOT DETECTED Final   Influenza B NOT DETECTED NOT DETECTED Final   Parainfluenza Virus 1 NOT DETECTED NOT DETECTED Final   Parainfluenza Virus 2 NOT DETECTED NOT DETECTED Final   Parainfluenza Virus 3 NOT DETECTED NOT DETECTED Final   Parainfluenza Virus 4 NOT DETECTED NOT DETECTED Final   Respiratory Syncytial Virus NOT DETECTED NOT DETECTED Final   Bordetella pertussis NOT DETECTED NOT DETECTED Final   Bordetella Parapertussis NOT DETECTED NOT DETECTED Final   Chlamydophila pneumoniae NOT DETECTED NOT DETECTED Final   Mycoplasma pneumoniae NOT DETECTED NOT DETECTED Final    Comment: Performed at [facility], [address], [place], [place] [address]     Time coordinating discharge: Over 30 minutes  SIGNED:   Charise Killian, MD  [facility] 09/01/2023, 12:41 PM Pager   If 7PM-7AM, please contact night-coverage [email or web address]

## 2023-09-01 NOTE — Plan of Care (Signed)
  Problem: Education: Goal: Knowledge of disease or condition will improve Outcome: Progressing   Problem: Activity: Goal: Ability to tolerate increased activity will improve Outcome: Progressing   Problem: Clinical Measurements: Goal: Respiratory complications will improve Outcome: Progressing   Problem: Clinical Measurements: Goal: Cardiovascular complication will be avoided Outcome: Progressing   Problem: Activity: Goal: Risk for activity intolerance will decrease Outcome: Progressing   Problem: Pain Managment: Goal: General experience of comfort will improve Outcome: Progressing   Problem: Safety: Goal: Ability to remain free from injury will improve Outcome: Progressing

## 2023-09-01 NOTE — TOC Transition Note (Signed)
Transition of Care [facility]) - CM/SW Discharge Note   Patient Details  Name: Kenneth Benson MRN: [id number] Date of Birth: [birth year]  [facility] CM/SW Contact:  Truddie Hidden, RN Phone Number: 09/01/2023, 3:27 PM   Clinical Narrative:    [name] taxi arranged.  TOC signing off.           Patient Goals and CMS Choice      Discharge Placement                         Discharge Plan and Services Additional resources added to the After Visit Summary for                                       Social Determinants of Health (SDOH) Interventions SDOH Screenings   Food Insecurity: No Food Insecurity (08/22/2023)  Housing: Low Risk  (08/22/2023)  Transportation Needs: No Transportation Needs (08/22/2023)  Utilities: Not At Risk (08/22/2023)  Tobacco Use: High Risk (08/22/2023)     Readmission Risk Interventions     No data to display

## 2023-09-01 NOTE — Progress Notes (Signed)
PULMONOLOGY         Date: 09/01/2023,   MRN# [id number] Kenneth Benson May 28, 1961     AdmissionWeight: 122.5 kg                 CurrentWeight: 130.9 kg  Referring provider: Dr Mayford Knife   CHIEF COMPLAINT:   Recurrent Severe Exacerbation of COPD   HISTORY OF PRESENT ILLNESS   Kenneth Benson is a 62 y.o. 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 He has been experiencing gradually worsening shortness of breath and feeling that his lungs are tight.  He has had recurrent admissions for COPD and respiratory failure. In addition, he seems that mucus accumulation has been recurring faster and is more difficult for him to clear but otherwise, he does not feel that he is coughing more.  In addition, he had some chest pain and back pain when he takes deep breaths but it does not occur when he is not deep breathing.  He denies any fever, chills, nausea, vomiting, diarrhea, palpitations. On arrival to the ED, patient was normotensive at 122/76 with heart rate of 154.  He was saturating at 95% on 4 L and was subsequently weaned to his home 2 L.  He was tachypneic up to 32/min.  He was afebrile at 98.3. Initial workup notable for unremarkable CBC, troponin, and BNP notable only for glucose of 106 and BUN of 28.  Renal function intact with GFR above 60.  COVID-19 PCR negative.  Chest x-ray with no acute changes.  EKG obtained and notable for atrial fibrillation with RVR.  Despite maxima medical management patient is minimally improved.   09/01/23- patient reports he is close to baseline and inquires about going home.  He has O2 at home and sates he knows how to use it.  He is with [employer] and has both tanks and concentrators for O2 therapy at home. He is not using incentive spirometer or flutter valve , we discussed  this today and importance of keeping his lungs conditioned. We have advanced steroids to PO prednisone taper from solumedrol IV. His respiratory exam is with decreased air entry but less rhonchi and no wheezing.  He is cleared from pulmonary for dc home once stable from PT/OT perspective.  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   PAST MEDICAL HISTORY   Past Medical History:  Diagnosis Date   A-fib (HCC) 02/06/2022   AKI (acute kidney injury) (HCC) 12/17/2022   Arrhythmia    atrial fibrillation   Asthma    CHF (congestive heart failure) (HCC)    COPD (chronic obstructive pulmonary disease) (HCC)    Hyperkalemia 12/17/2022   Hypokalemia 04/13/2023   Myocardial injury 04/13/2023   Tobacco abuse      SURGICAL HISTORY   History reviewed. No pertinent surgical history.   FAMILY HISTORY   Family History  Problem Relation Age of Onset   Heart disease Mother    Atrial fibrillation Father      SOCIAL HISTORY   Social History   Tobacco Use   Smoking status: Every Day    Current packs/day: 1.50    Average packs/day: 1.5 packs/day for 27.0 years (40.5 ttl pk-yrs)    Types: Cigarettes   Tobacco comments:    0.5PPD  at the most.  trying to quit.  05/11/2023 hfb  Vaping Use   Vaping status: Never Used  Substance Use Topics   Alcohol use: No   Drug use: No     MEDICATIONS    Home Medication:    Current Medication:  Current Facility-Administered Medications:    acetaminophen (TYLENOL) tablet 650 mg, 650 mg, Oral, Q6H PRN, 650 mg at 08/25/23 1835 **OR** acetaminophen (TYLENOL) suppository 650 mg, 650 mg, Rectal, Q6H PRN, Verdene Lennert, MD   albuterol (PROVENTIL) (2.5 MG/3ML) 0.083% nebulizer solution 2.5 mg, 2.5 mg, Nebulization, Q2H PRN, Lindajo Royal V, MD, 2.5 mg at 08/30/23 0454   ALPRAZolam (XANAX) tablet 0.25 mg, 0.25 mg, Oral, BID PRN, Charise Killian, MD   apixaban Everlene Balls) tablet 5 mg, 5 mg, Oral,  BID, Verdene Lennert, MD, 5 mg at 08/31/23 2219   butalbital-acetaminophen-caffeine (FIORICET) 50-325-40 MG per tablet 2 tablet, 2 tablet, Oral, BID PRN, Darlin Priestly, MD   digoxin Margit Banda) tablet 0.125 mg, 0.125 mg, Oral, Daily, Hudson, Caralyn, PA-C, 0.125 mg at 08/31/23 1610   diltiazem (CARDIZEM CD) 24 hr capsule 180 mg, 180 mg, Oral, Daily, Hudson, Caralyn, PA-C   guaiFENesin-dextromethorphan (ROBITUSSIN DM) 100-10 MG/5ML syrup 5 mL, 5 mL, Oral, Q4H PRN, Darlin Priestly, MD, 5 mL at 08/31/23 2219   ipratropium (ATROVENT) nebulizer solution 0.5 mg, 0.5 mg, Nebulization, Q6H, Charise Killian, MD, 0.5 mg at 09/01/23 0750   levalbuterol (XOPENEX) nebulizer solution 1.25 mg, 1.25 mg, Nebulization, Q6H, Charise Killian, MD, 1.25 mg at 09/01/23 0749   menthol-cetylpyridinium (CEPACOL) lozenge 3 mg, 1 lozenge, Oral, PRN, Darlin Priestly, MD   methylPREDNISolone sodium succinate (SOLU-MEDROL) 40 mg/mL injection 40 mg, 40 mg, Intravenous, Q24H, Trayson Stitely, MD   metoprolol tartrate (LOPRESSOR) tablet 50 mg, 50 mg, Oral, BID, Darlin Priestly, MD, 50 mg at 08/31/23 2219   ondansetron (ZOFRAN) tablet 4 mg, 4 mg, Oral, Q6H PRN **OR** ondansetron (ZOFRAN) injection 4 mg, 4 mg, Intravenous, Q6H PRN, Verdene Lennert, MD   Oral care mouth rinse, 15 mL, Mouth Rinse, PRN, Darlin Priestly, MD    ALLERGIES   Patient has no known allergies.     REVIEW OF SYSTEMS    Review of Systems:  Gen:  Denies  fever, sweats, chills weigh loss  HEENT: Denies blurred vision, double vision, ear pain, eye pain, hearing loss, nose bleeds, sore throat Cardiac:  No dizziness, chest pain or heaviness, chest tightness,edema Resp:   reports dyspnea chronically  Gi: Denies swallowing difficulty, stomach pain, nausea or vomiting, diarrhea, constipation, bowel incontinence Gu:  Denies bladder incontinence, burning urine Ext:   Denies Joint pain, stiffness or swelling Skin: Denies  skin rash, easy bruising or bleeding or hives Endoc:   Denies polyuria, polydipsia , polyphagia or weight change Psych:   Denies depression, insomnia or hallucinations   Other:  All other systems negative   VS: BP 106/83 (BP Location: Right Arm)   Pulse (!) 51   Temp 98.2 F (36.8 C) (Oral)   Resp 19   Ht 6' (1.829 m)   Wt 130.9 kg   SpO2 96%   BMI 39.14 kg/m      PHYSICAL EXAM    GENERAL:NAD, no fevers, chills, no weakness no fatigue HEAD: Normocephalic, atraumatic.  EYES: Pupils equal, round, reactive to light. Extraocular muscles intact. No scleral icterus.  MOUTH: Moist mucosal membrane. Dentition intact. No abscess noted.  EAR, NOSE, THROAT: Clear without exudates. No external lesions.  NECK: Supple. No thyromegaly. No nodules. No  JVD.  PULMONARY: decreased breath sounds with mild rhonchi worse at bases bilaterally.  CARDIOVASCULAR: S1 and S2. Regular rate and rhythm. No murmurs, rubs, or gallops. No edema. Pedal pulses 2+ bilaterally.  GASTROINTESTINAL: Soft, nontender, nondistended. No masses. Positive bowel sounds. No hepatosplenomegaly.  MUSCULOSKELETAL: No swelling, clubbing, or edema. Range of motion full in all extremities.  NEUROLOGIC: Cranial nerves II through XII are intact. No gross focal neurological deficits. Sensation intact. Reflexes intact.  SKIN: No ulceration, lesions, rashes, or cyanosis. Skin warm and dry. Turgor intact.  PSYCHIATRIC: Mood, affect within normal limits. The patient is awake, alert and oriented x 3. Insight, judgment intact.       IMAGING     ASSESSMENT/PLAN    Acute on chronic hypoxemic respiratory failure - patient has had episodes of Afrvr  - has had hypoxemia <85% even with supplemental O2.    -patient is currently on maximal medical management with IV streroids solumedrol 125.  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  Chronic hypoxemia    Continue supplemental O2   -encourage PT/OT     Thank you for allowing me to participate in the care of this patient.   Patient/Family are satisfied with care plan and all questions have been answered.    Provider disclosure: Patient with at least one acute or chronic illness or injury that poses a threat to life or bodily function and is being managed actively during this encounter.  All of the below services have been performed independently by signing provider:  review of prior documentation from internal and or external health records.  Review of previous and current lab results.  Interview and comprehensive assessment during patient visit today. Review of current and previous chest radiographs/CT scans. Discussion of management and test interpretation with health care team and patient/family.   This document was prepared using Dragon voice recognition software and may include unintentional dictation errors.     [name], M.D.  Division of Pulmonary & Critical Care Medicine

## 2023-12-21 ENCOUNTER — Inpatient Hospital Stay
Admission: EM | Admit: 2023-12-21 | Discharge: 2023-12-24 | DRG: 189 | Disposition: A | Discharge status: Home Health | Payer: Medicaid Other | Attending: Internal Medicine | Admitting: Internal Medicine

## 2023-12-21 ENCOUNTER — Emergency Department: Payer: Medicaid Other

## 2023-12-21 ENCOUNTER — Other Ambulatory Visit: Payer: Self-pay

## 2023-12-21 DIAGNOSIS — Z5986 Financial insecurity: Secondary | ICD-10-CM

## 2023-12-21 DIAGNOSIS — E119 Type 2 diabetes mellitus without complications: Secondary | ICD-10-CM | POA: Diagnosis present

## 2023-12-21 DIAGNOSIS — Z79899 Other long term (current) drug therapy: Secondary | ICD-10-CM | POA: Diagnosis not present

## 2023-12-21 DIAGNOSIS — Z7984 Long term (current) use of oral hypoglycemic drugs: Secondary | ICD-10-CM

## 2023-12-21 DIAGNOSIS — I5022 Chronic systolic (congestive) heart failure: Secondary | ICD-10-CM | POA: Diagnosis present

## 2023-12-21 DIAGNOSIS — Z6837 Body mass index (BMI) 37.0-37.9, adult: Secondary | ICD-10-CM | POA: Diagnosis not present

## 2023-12-21 DIAGNOSIS — I480 Paroxysmal atrial fibrillation: Secondary | ICD-10-CM | POA: Diagnosis present

## 2023-12-21 DIAGNOSIS — Z7901 Long term (current) use of anticoagulants: Secondary | ICD-10-CM

## 2023-12-21 DIAGNOSIS — J441 Chronic obstructive pulmonary disease with (acute) exacerbation: Principal | ICD-10-CM

## 2023-12-21 DIAGNOSIS — R0602 Shortness of breath: Secondary | ICD-10-CM | POA: Diagnosis present

## 2023-12-21 DIAGNOSIS — J205 Acute bronchitis due to respiratory syncytial virus: Secondary | ICD-10-CM | POA: Diagnosis present

## 2023-12-21 DIAGNOSIS — I110 Hypertensive heart disease with heart failure: Secondary | ICD-10-CM | POA: Diagnosis present

## 2023-12-21 DIAGNOSIS — F17210 Nicotine dependence, cigarettes, uncomplicated: Secondary | ICD-10-CM | POA: Diagnosis present

## 2023-12-21 DIAGNOSIS — Z9981 Dependence on supplemental oxygen: Secondary | ICD-10-CM | POA: Diagnosis not present

## 2023-12-21 DIAGNOSIS — R5381 Other malaise: Secondary | ICD-10-CM | POA: Diagnosis not present

## 2023-12-21 DIAGNOSIS — J9611 Chronic respiratory failure with hypoxia: Secondary | ICD-10-CM | POA: Diagnosis present

## 2023-12-21 DIAGNOSIS — J9621 Acute and chronic respiratory failure with hypoxia: Principal | ICD-10-CM | POA: Diagnosis present

## 2023-12-21 DIAGNOSIS — I10 Essential (primary) hypertension: Secondary | ICD-10-CM | POA: Diagnosis present

## 2023-12-21 DIAGNOSIS — Z8249 Family history of ischemic heart disease and other diseases of the circulatory system: Secondary | ICD-10-CM

## 2023-12-21 DIAGNOSIS — J440 Chronic obstructive pulmonary disease with acute lower respiratory infection: Secondary | ICD-10-CM | POA: Diagnosis present

## 2023-12-21 DIAGNOSIS — R0789 Other chest pain: Secondary | ICD-10-CM | POA: Insufficient documentation

## 2023-12-21 DIAGNOSIS — Z599 Problem related to housing and economic circumstances, unspecified: Secondary | ICD-10-CM

## 2023-12-21 LAB — CBC
HCT: 51.8 % (ref 39.0–52.0)
Hemoglobin: 16.8 g/dL (ref 13.0–17.0)
MCH: 31 pg (ref 26.0–34.0)
MCHC: 32.4 g/dL (ref 30.0–36.0)
MCV: 95.6 fL (ref 80.0–100.0)
Platelets: 201 10*3/uL (ref 150–400)
RBC: 5.42 MIL/uL (ref 4.22–5.81)
RDW: 14.7 % (ref 11.5–15.5)
WBC: 4.6 10*3/uL (ref 4.0–10.5)
nRBC: 0 % (ref 0.0–0.2)

## 2023-12-21 LAB — PROCALCITONIN: Procalcitonin: <0.1 ng/mL

## 2023-12-21 LAB — PROTIME-INR
INR: 1.3 — ABNORMAL HIGH (ref 0.8–1.2)
Prothrombin Time: 16.3 s — ABNORMAL HIGH (ref 11.4–15.2)

## 2023-12-21 LAB — RESP PANEL BY RT-PCR (RSV, FLU A&B, COVID)  RVPGX2
Influenza A by PCR: NEGATIVE
Influenza B by PCR: NEGATIVE
Resp Syncytial Virus by PCR: POSITIVE — AB
SARS Coronavirus 2 by RT PCR: NEGATIVE

## 2023-12-21 LAB — APTT: aPTT: 30 s (ref 24–36)

## 2023-12-21 LAB — COMPREHENSIVE METABOLIC PANEL
ALT: 18 U/L (ref 0–44)
AST: 27 U/L (ref 15–41)
Albumin: 3.8 g/dL (ref 3.5–5.0)
Alkaline Phosphatase: 53 U/L (ref 38–126)
Anion gap: 11 (ref 5–15)
BUN: 6 mg/dL — ABNORMAL LOW (ref 8–23)
CO2: 26 mmol/L (ref 22–32)
Calcium: 8.4 mg/dL — ABNORMAL LOW (ref 8.9–10.3)
Chloride: 104 mmol/L (ref 98–111)
Creatinine, Ser: 0.89 mg/dL (ref 0.61–1.24)
GFR, Estimated: >60 mL/min (ref >60)
Glucose, Bld: 102 mg/dL — ABNORMAL HIGH (ref 70–99)
Potassium: 4.9 mmol/L (ref 3.5–5.1)
Sodium: 141 mmol/L (ref 135–145)
Total Bilirubin: 1.5 mg/dL — ABNORMAL HIGH (ref 0.0–1.2)
Total Protein: 7.3 g/dL (ref 6.5–8.1)

## 2023-12-21 LAB — TROPONIN I (HIGH SENSITIVITY)
Troponin I (High Sensitivity): 14 ng/L (ref <18)
Troponin I (High Sensitivity): 13 ng/L (ref <18)

## 2023-12-21 LAB — LACTIC ACID, PLASMA: Lactic Acid, Venous: 1.1 mmol/L (ref 0.5–1.9)

## 2023-12-21 LAB — BRAIN NATRIURETIC PEPTIDE: B Natriuretic Peptide: 154 pg/mL — ABNORMAL HIGH (ref 0.0–100.0)

## 2023-12-21 MED ORDER — METOPROLOL TARTRATE 50 MG PO TABS: 50.0000 mg | ORAL_TABLET | Freq: Two times a day (BID) | ORAL | Status: DC

## 2023-12-21 MED ORDER — NICOTINE 21 MG/24HR TD PT24: 21.0000 mg | MEDICATED_PATCH | Freq: Every day | TRANSDERMAL | Status: DC | PRN

## 2023-12-21 MED ORDER — GUAIFENESIN 100 MG/5ML PO LIQD
5.0000 mL | ORAL | Status: DC | PRN
  Administered 2023-12-22 – 2023-12-23 (×2): 5 mL via ORAL
  Filled 2023-12-21 (×2): qty 10

## 2023-12-21 MED ORDER — ENOXAPARIN SODIUM 60 MG/0.6ML IJ SOSY: 60.0000 mg | PREFILLED_SYRINGE | INTRAMUSCULAR | Status: DC

## 2023-12-21 MED ORDER — SENNOSIDES-DOCUSATE SODIUM 8.6-50 MG PO TABS: 1.0000 | ORAL_TABLET | Freq: Every evening | ORAL | Status: DC | PRN

## 2023-12-21 MED ORDER — ACETAMINOPHEN 325 MG PO TABS
650.0000 mg | ORAL_TABLET | Freq: Four times a day (QID) | ORAL | Status: DC | PRN
  Administered 2023-12-23: 650 mg via ORAL
  Filled 2023-12-21: qty 2

## 2023-12-21 MED ORDER — SPIRONOLACTONE 12.5 MG HALF TABLET
12.5000 mg | ORAL_TABLET | Freq: Every day | ORAL | Status: DC
  Administered 2023-12-22 – 2023-12-24 (×3): 12.5 mg via ORAL
  Filled 2023-12-21 (×3): qty 1

## 2023-12-21 MED ORDER — METOPROLOL TARTRATE 50 MG PO TABS
50.0000 mg | ORAL_TABLET | Freq: Two times a day (BID) | ORAL | Status: DC
Start: 2023-12-22 — End: 2023-12-24
  Administered 2023-12-22 – 2023-12-24 (×5): 50 mg via ORAL
  Filled 2023-12-21 (×2): qty 1
  Filled 2023-12-21: qty 2
  Filled 2023-12-21 (×2): qty 1

## 2023-12-21 MED ORDER — ONDANSETRON HCL 4 MG/2ML IJ SOLN: 4.0000 mg | Freq: Four times a day (QID) | INTRAMUSCULAR | Status: DC | PRN

## 2023-12-21 MED ORDER — DIGOXIN 125 MCG PO TABS
0.1250 mg | ORAL_TABLET | Freq: Every day | ORAL | Status: DC
  Administered 2023-12-22 – 2023-12-24 (×3): 0.125 mg via ORAL
  Filled 2023-12-21 (×3): qty 1

## 2023-12-21 MED ORDER — DILTIAZEM HCL ER COATED BEADS 180 MG PO CP24
180.0000 mg | ORAL_CAPSULE | Freq: Every day | ORAL | Status: DC
  Administered 2023-12-22 – 2023-12-24 (×3): 180 mg via ORAL
  Filled 2023-12-21 (×3): qty 1

## 2023-12-21 MED ORDER — IPRATROPIUM-ALBUTEROL 0.5-2.5 (3) MG/3ML IN SOLN
3.0000 mL | Freq: Once | RESPIRATORY_TRACT | Status: AC
Start: 2023-12-21 — End: 2023-12-21
  Administered 2023-12-21: 3 mL via RESPIRATORY_TRACT
  Filled 2023-12-21: qty 3

## 2023-12-21 MED ORDER — APIXABAN 5 MG PO TABS
5.0000 mg | ORAL_TABLET | Freq: Two times a day (BID) | ORAL | Status: DC
  Administered 2023-12-21 – 2023-12-24 (×6): 5 mg via ORAL
  Filled 2023-12-21 (×6): qty 1

## 2023-12-21 MED ORDER — HYDROCOD POLI-CHLORPHE POLI ER 10-8 MG/5ML PO SUER
5.0000 mL | Freq: Every evening | ORAL | Status: AC | PRN
  Administered 2023-12-21: 5 mL via ORAL
  Filled 2023-12-21 (×2): qty 5

## 2023-12-21 MED ORDER — ARFORMOTEROL TARTRATE 15 MCG/2ML IN NEBU
15.0000 ug | INHALATION_SOLUTION | Freq: Two times a day (BID) | RESPIRATORY_TRACT | Status: DC
Start: 2023-12-21 — End: 2023-12-24
  Administered 2023-12-21 – 2023-12-24 (×5): 15 ug via RESPIRATORY_TRACT
  Filled 2023-12-21 (×7): qty 2

## 2023-12-21 MED ORDER — ONDANSETRON HCL 4 MG PO TABS: 4.0000 mg | ORAL_TABLET | Freq: Four times a day (QID) | ORAL | Status: DC | PRN

## 2023-12-21 MED ORDER — IPRATROPIUM-ALBUTEROL 0.5-2.5 (3) MG/3ML IN SOLN
3.0000 mL | Freq: Once | RESPIRATORY_TRACT | Status: AC
  Administered 2023-12-21: 3 mL via RESPIRATORY_TRACT
  Filled 2023-12-21: qty 3

## 2023-12-21 MED ORDER — ALBUTEROL SULFATE (2.5 MG/3ML) 0.083% IN NEBU: 3.0000 mL | INHALATION_SOLUTION | Freq: Four times a day (QID) | RESPIRATORY_TRACT | Status: DC | PRN

## 2023-12-21 MED ORDER — HYDRALAZINE HCL 20 MG/ML IJ SOLN: 5.0000 mg | Freq: Four times a day (QID) | INTRAMUSCULAR | Status: DC | PRN

## 2023-12-21 MED ORDER — PREDNISONE 20 MG PO TABS
60.0000 mg | ORAL_TABLET | Freq: Once | ORAL | Status: AC
  Administered 2023-12-21: 60 mg via ORAL
  Filled 2023-12-21: qty 3

## 2023-12-21 MED ORDER — ACETAMINOPHEN 650 MG RE SUPP
650.0000 mg | Freq: Four times a day (QID) | RECTAL | Status: DC | PRN
Start: 2023-12-21 — End: 2023-12-26

## 2023-12-21 MED ORDER — IPRATROPIUM-ALBUTEROL 0.5-2.5 (3) MG/3ML IN SOLN
3.0000 mL | Freq: Four times a day (QID) | RESPIRATORY_TRACT | Status: DC
  Filled 2023-12-21: qty 3

## 2023-12-21 MED ORDER — METOPROLOL TARTRATE 5 MG/5ML IV SOLN: 5.0000 mg | INTRAVENOUS | Status: DC | PRN

## 2023-12-21 MED ORDER — LIDOCAINE 5 % EX PTCH
1.0000 | MEDICATED_PATCH | CUTANEOUS | Status: DC
  Administered 2023-12-21 – 2023-12-23 (×3): 1 via TRANSDERMAL
  Filled 2023-12-21 (×3): qty 1

## 2023-12-21 MED ORDER — DILTIAZEM HCL ER COATED BEADS 180 MG PO CP24
180.0000 mg | ORAL_CAPSULE | Freq: Every day | ORAL | Status: DC
  Filled 2023-12-21: qty 1

## 2023-12-21 MED ORDER — IPRATROPIUM-ALBUTEROL 0.5-2.5 (3) MG/3ML IN SOLN
3.0000 mL | RESPIRATORY_TRACT | Status: DC | PRN
  Administered 2023-12-21: 3 mL via RESPIRATORY_TRACT

## 2023-12-21 MED ORDER — METOPROLOL TARTRATE 25 MG PO TABS
25.0000 mg | ORAL_TABLET | Freq: Once | ORAL | Status: AC
  Administered 2023-12-21: 25 mg via ORAL
  Filled 2023-12-21: qty 1

## 2023-12-21 NOTE — H&P (Signed)
History and Physical   Kenneth Benson:[id number] DOB: 1961-05-21 DOA: 12/21/2023  PCP: Oneita Hurt No  Outpatient Specialists: [facility] cardiology, Dr. Melton Alar Patient coming from: Home via EMS  I have personally briefly reviewed patient's old medical records in [facility] EMR.  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  Patient reports that he has been out of power and therefore has not been able to use his oxygen at home.  Vitals in the ED showed temperature of 99.7, respiration rate 20, heart rate of 102, blood pressure 134/77, SpO2 of 95% on 4 L nasal cannula.  Serum sodium is 141, potassium 4.9, chloride 104, bicarb 26, BUN of 6, serum creatinine of 0.89, EGFR greater than 60, nonfasting blood glucose 102, WBC 4.6, hemoglobin 16.8, platelets of 201.  ED treatment: DuoNebs x 2, prednisone 60 mg p.o. one-time dose, metoprolol 25 mg p.o. one-time dose. ------------------------------ At bedside, patient he is able to tell me his name, age, location, current year.   He has been short of breath since last night. He denies trauma to his person. He endorses chest discomfort when he coughs.   He denies nausea and vomiting, dysuria, hematuria, diarrhea, blood in his stool.  Social history: Patient lives at home on his own.  He has not been able to play electrical bills and therefore has not been able to use his oxygen. He lives with his girlfriend and friend. He has been away and they did not pay the electricity bill so when he came back from his trip, it was $900 due to nonpayment. He denies etoh and recreational drug use. He is disabled.  ROS: Constitutional: no weight change, no  fever ENT/Mouth: no sore throat, no rhinorrhea Eyes: no eye pain, no vision changes Cardiovascular: no chest pain, + dyspnea,  no edema, no palpitations Respiratory: + cough, no sputum, no wheezing Gastrointestinal: no nausea, no vomiting, no diarrhea, no constipation Genitourinary: no urinary incontinence, no dysuria, no hematuria Musculoskeletal: no arthralgias, + myalgias Skin: no skin lesions, no pruritus, Neuro: + weakness, no loss of consciousness, no syncope Psych: no anxiety, no depression, + decrease appetite Heme/Lymph: no bruising, no bleeding  ED Course: Discussed with EDP, patient requiring hospitalization for chief concerns of COPD exacerbation.  Assessment/Plan  Principal Problem:   COPD exacerbation (HCC) Active Problems:   Cigarette smoker   Physical deconditioning   HTN (hypertension)   Chronic hypoxic respiratory failure (HCC)   SOB (shortness of breath)   Morbid obesity (HCC)   Paroxysmal atrial fibrillation (HCC)   Financial difficulties   Musculoskeletal chest pain   Assessment and Plan:  * COPD exacerbation (HCC) Secondary to patient losing power and not being able to use his O2 supplementation complicated by RSV infection Duoneb scheduled, 3 doses ordered Resumed home maintenance inhaler  Cigarette smoker As needed nicotine patch ordered for nicotine craving  HTN (hypertension) Home metoprolol tartrate 50 mg p.o. twice daily, spironolactone 12.5 mg p.o. daily, diltiazem 180 mg daily resumed on admission Hydralazine 5 mg IV every 6 hours as needed for SBP greater 165, 4 days ordered  Physical deconditioning Fall precautions  Chronic hypoxic respiratory failure (HCC) Patient is on chronic 2 L nasal cannula, has not had access to oxygen due to power outage and not able  to pay electrical bills  Musculoskeletal chest pain Lidocaine patch ordered  Financial difficulties TOC has been consulted  Paroxysmal atrial fibrillation (HCC) Apixaban 5  mg p.o. twice daily resumed on admission Diltiazem 180 mg daily resumed Home metoprolol 50 mg BID resumed Metoprolol tartrate 5 mg IV every 4 hours as needed for heart rate greater than 120, 3 doses ordered  Morbid obesity (HCC) This meets criteria for morbid obesity based on the presence of 1 or more chronic comorbidities. Patient has 37.97 and hypertension. This complicates overall care and prognosis.   SOB (shortness of breath) Secondary to COPD exacerbation, treat per above  Chart reviewed.   DVT prophylaxis: Enoxaparin Code Status: full code  Diet: Heart healthy/carb modified Family Communication: no Disposition Plan: Pending clinical course Consults called:  Admission status: Telemetry medical, inpatient  Past Medical History:  Diagnosis Date   A-fib (HCC) 02/06/2022   AKI (acute kidney injury) (HCC) 12/17/2022   Arrhythmia    atrial fibrillation   Asthma    CHF (congestive heart failure) (HCC)    COPD (chronic obstructive pulmonary disease) (HCC)    Hyperkalemia 12/17/2022   Hypokalemia 04/13/2023   Myocardial injury 04/13/2023   Tobacco abuse    History reviewed. No pertinent surgical history.  Social History:  reports that he has been smoking cigarettes. He has a 40.5 pack-year smoking history. He does not have any smokeless tobacco history on file. He reports that he does not drink alcohol and does not use drugs.  No Known Allergies Family History  Problem Relation Age of Onset   Heart disease Mother    Atrial fibrillation Father    Family history: Family history reviewed and not pertinent.  Prior to Admission medications   Medication Sig Start Date End Date Taking? Authorizing Provider  albuterol (PROVENTIL) (2.5 MG/3ML) 0.083% nebulizer solution Take 3 mLs (2.5 mg total) by nebulization every 6 (six) hours as needed for wheezing or shortness of breath. 07/10/23 09/08/23  Enedina Finner, MD  albuterol (VENTOLIN HFA) 108 (90 Base) MCG/ACT inhaler Inhale 2 puffs  into the lungs every 6 (six) hours as needed for wheezing or shortness of breath. 07/10/23   Enedina Finner, MD  apixaban (ELIQUIS) 5 MG TABS tablet Take 1 tablet (5 mg total) by mouth 2 (two) times daily. 07/10/23   Enedina Finner, MD  arformoterol (BROVANA) 15 MCG/2ML NEBU Take 2 mLs (15 mcg total) by nebulization 2 (two) times daily. 07/10/23 09/08/23  Enedina Finner, MD  digoxin (LANOXIN) 0.125 MG tablet Take 1 tablet (0.125 mg total) by mouth daily. 09/02/23 10/02/23  Charise Killian, MD  diltiazem (CARDIZEM CD) 180 MG 24 hr capsule Take 1 capsule (180 mg total) by mouth daily. 09/02/23 10/02/23  Charise Killian, MD  JARDIANCE 10 MG TABS tablet Take 1 tablet (10 mg total) by mouth daily. 07/10/23   Enedina Finner, MD  metoprolol tartrate (LOPRESSOR) 50 MG tablet Take 1 tablet (50 mg total) by mouth 2 (two) times daily. 09/01/23 10/01/23  Charise Killian, MD  spironolactone (ALDACTONE) 25 MG tablet Take 0.5 tablets (12.5 mg total) by mouth daily. Hold this medication until you see your cardiologist 09/01/23   Charise Killian, MD  torsemide (DEMADEX) 20 MG tablet Take 2 tablets (40 mg total) by mouth daily. Hold this medication until you see your cardiologist 09/01/23 10/31/23  Charise Killian, MD  amiodarone (PACERONE) 200 MG tablet Take 1 tablet (200 mg total) by mouth 2 (two) times daily. 02/11/22 03/15/22  Lewie Chamber,  MD   Physical Exam: Vitals:   12/21/23 1445 12/21/23 1600 12/21/23 1703 12/21/23 1749  BP:  102/61 (!) 121/109 122/73  Pulse: (!) 102 86  (!) 113  Resp: 20 (!) 31 (!) 29   Temp:      SpO2: 95% 96%    Weight:      Height:       Constitutional: appears age appropriate, NAD, calm Eyes: PERRL, lids and conjunctivae normal ENMT: Mucous membranes are moist. Posterior pharynx clear of any exudate or lesions. Age-appropriate dentition. Hearing appropriate. Neck: normal, supple, no masses, no thyromegaly Respiratory: clear to auscultation bilaterally, no wheezing, no crackles.  Normal respiratory effort. No accessory muscle use.  Cardiovascular: Regular rate and rhythm, no murmurs / rubs / gallops. No extremity edema. 2+ pedal pulses. No carotid bruits.  Abdomen: obese abdomen, no tenderness, no masses palpated, no hepatosplenomegaly. Bowel sounds positive.  Musculoskeletal: no clubbing / cyanosis. No joint deformity upper and lower extremities. Good ROM, no contractures, no atrophy. Normal muscle tone.  Skin: no rashes, lesions, ulcers. No induration Neurologic: Sensation intact. Strength 5/5 in all 4.  Psychiatric: Normal judgment and insight. Alert and oriented x 3. Normal mood.   EKG: independently reviewed, showing atrial fibrillation with rate of 105, QTc 338  Chest x-ray on Admission: I personally reviewed and I agree with radiologist reading as below.  DG Chest 2 View Result Date: 12/21/2023 CLINICAL DATA:  Shortness of breath. EXAM: CHEST - 2 VIEW COMPARISON:  Chest radiograph dated 09/01/2023. FINDINGS: The heart size and mediastinal contours are within normal limits. Both lungs are clear. The visualized skeletal structures are unremarkable. IMPRESSION: No active cardiopulmonary disease. Electronically Signed   By: Elgie Collard M.D.   On: 12/21/2023 15:41   Labs on Admission: I have personally reviewed following labs  CBC: Recent Labs  Lab 12/21/23 1425  WBC 4.6  HGB 16.8  HCT 51.8  MCV 95.6  PLT 201   Basic Metabolic Panel: Recent Labs  Lab 12/21/23 1425  NA 141  K 4.9  CL 104  CO2 26  GLUCOSE 102*  BUN 6*  CREATININE 0.89  CALCIUM 8.4*   GFR: Estimated Creatinine Clearance: 120.1 mL/min (by C-G formula based on SCr of 0.89 mg/dL).  Liver Function Tests: Recent Labs  Lab 12/21/23 1425  AST 27  ALT 18  ALKPHOS 53  BILITOT 1.5*  PROT 7.3  ALBUMIN 3.8   Coagulation Profile: Recent Labs  Lab 12/21/23 1425  INR 1.3*   Urine analysis:    Component Value Date/Time   COLORURINE YELLOW (A) 12/16/2022 1305   APPEARANCEUR  CLEAR (A) 12/16/2022 1305   LABSPEC 1.026 12/16/2022 1305   PHURINE 7.0 12/16/2022 1305   GLUCOSEU NEGATIVE 12/16/2022 1305   HGBUR NEGATIVE 12/16/2022 1305   BILIRUBINUR NEGATIVE 12/16/2022 1305   KETONESUR NEGATIVE 12/16/2022 1305   PROTEINUR 30 (A) 12/16/2022 1305   NITRITE NEGATIVE 12/16/2022 1305   LEUKOCYTESUR NEGATIVE 12/16/2022 1305   This document was prepared using Dragon Voice Recognition software and may include unintentional dictation errors.  Dr. Sedalia Muta [facility]  If 7PM-7AM, please contact overnight-coverage provider If 7AM-7PM, please contact day attending provider [email or web address]  12/21/2023, 7:16 PM

## 2023-12-21 NOTE — Assessment & Plan Note (Addendum)
This meets criteria for morbid obesity based on the presence of 1 or more chronic comorbidities. Patient has 37.97 and hypertension. This complicates overall care and prognosis.

## 2023-12-21 NOTE — ED Triage Notes (Signed)
Pt to ED [facility] for shob started last night, +dizzy, nausea, chills. Wears 2  LNC at home. 96% on 4 LNC

## 2023-12-21 NOTE — ED Provider Triage Note (Signed)
Emergency Medicine Provider Triage Evaluation Note  Kenneth Benson , a 63 y.o. male  was evaluated in triage.  Pt complains of sob since last night, power went out and couldn't use his home O2. No CP, new cough or temp. History of COPD, CHF, afib compliant with AC  Review of Systems  Positive:  Negative:   Physical Exam  BP 134/77   Pulse 77   Temp 99.7 F (37.6 C)   Resp (!) 24   Ht 6' (1.829 m)   Wt 127 kg   SpO2 96%   BMI 37.97 kg/m  Gen:   Awake, no distress   Resp:  Normal effort  MSK:   Moves extremities without difficulty  Other:    Medical Decision Making  Medically screening exam initiated at 2:27 PM.  Appropriate orders placed.  Eliezer Champagne was informed that the remainder of the evaluation will be completed by another provider, this initial triage assessment does not replace that evaluation, and the importance of remaining in the ED until their evaluation is complete.  Chronic resp failure due to COPD having sob after losing home 02, VSS other than elevated resp, check labs including sepsis bundle.    Christen Bame, [place] 12/21/23 1428

## 2023-12-21 NOTE — ED Provider Notes (Signed)
[facility] Provider Note    Event Date/Time   First MD Initiated Contact with Patient 12/21/23 1505     (approximate)   History   Shortness of Breath   HPI  Kenneth Benson is a 63 y.o. male has a history of atrial fibrillation on anticoagulation, reduced EF, COPD   Cough with shortness of breath and wheezing for 1 day.  Trace leg swelling both legs which she reports is chronic.  Also advise he has not been able to use his nebulizer at the home because he does not have any power chair and on, he had the power disconnect to his house  He has not noticed any fever.  He has been coughing with productive cough and wheezing.  No pain.  He has been compliant with his Eliquis he still has this  Physical Exam   Triage Vital Signs: ED Triage Vitals  Encounter Vitals Group     BP 12/21/23 1421 134/77     Systolic BP Percentile --      Diastolic BP Percentile --      Pulse Rate 12/21/23 1421 77     Resp 12/21/23 1421 (!) 24     Temp 12/21/23 1424 99.7 F (37.6 C)     Temp src --      SpO2 12/21/23 1421 96 %     Weight 12/21/23 1420 280 lb (127 kg)     Height 12/21/23 1420 6' (1.829 m)     Head Circumference --      Peak Flow --      Pain Score 12/21/23 1420 5     Pain Loc --      Pain Education --      Exclude from Growth Chart --     Most recent vital signs: Vitals:   12/21/23 1600 12/21/23 1703  BP: 102/61 (!) 121/109  Pulse: 86   Resp: (!) 31 (!) 29  Temp:    SpO2: 96%      General: Awake, no distress.  Frequent dry cough but no distress. CV:  Good peripheral perfusion.  Normal tones and rate borderline tachycardia heart rate approximately 100 Resp:  Normal effort.  Slightly barrel chested appearance.  No accessory muscle use.  Moderate wheezing on expiration with frequent coughing.  No obvious discrete Rales or rhonchi.  Does not appear to be in any distress at this time Abd:  No distention.  Soft nontender nondistended Other:  Current  oxygen level down to 2 L at his baseline his oxygen level remains in the mid 90s  Vitals:   12/21/23 1600 12/21/23 1703  BP: 102/61 (!) 121/109  Pulse: 86   Resp: (!) 31 (!) 29  Temp:    SpO2: 96%       ED Results / Procedures / Treatments   Labs (all labs ordered are listed, but only abnormal results are displayed) Labs Reviewed  RESP PANEL BY RT-PCR (RSV, FLU A&B, COVID)  RVPGX2 - Abnormal; Notable for the following components:      Result Value   Resp Syncytial Virus by PCR POSITIVE (*)    All other components within normal limits  COMPREHENSIVE METABOLIC PANEL - Abnormal; Notable for the following components:   Glucose, Bld 102 (*)    BUN 6 (*)    Calcium 8.4 (*)    Total Bilirubin 1.5 (*)    All other components within normal limits  BRAIN NATRIURETIC PEPTIDE - Abnormal; Notable for the following components:  B Natriuretic Peptide 154.0 (*)    All other components within normal limits  PROTIME-INR - Abnormal; Notable for the following components:   Prothrombin Time 16.3 (*)    INR 1.3 (*)    All other components within normal limits  CULTURE, BLOOD (ROUTINE X 2)  CULTURE, BLOOD (ROUTINE X 2)  MRSA NEXT GEN BY PCR, NASAL  CBC  LACTIC ACID, PLASMA  APTT  URINALYSIS, W/ REFLEX TO CULTURE (INFECTION SUSPECTED)  BASIC METABOLIC PANEL  CBC  PROCALCITONIN  TROPONIN I (HIGH SENSITIVITY)  TROPONIN I (HIGH SENSITIVITY)     EKG  And interpreted by me at 1425 heart rate 105 QRS 90 QTc 400 Atrial fibrillation probable, left ventricular hypertrophy with repolarization abnormality.  No evidence of acute ischemia did noted   RADIOLOGY  Chest x-ray inter by me is negative for acute finding   DG Chest 2 View Result Date: 12/21/2023 CLINICAL DATA:  Shortness of breath. EXAM: CHEST - 2 VIEW COMPARISON:  Chest radiograph dated 09/01/2023. FINDINGS: The heart size and mediastinal contours are within normal limits. Both lungs are clear. The visualized skeletal structures  are unremarkable. IMPRESSION: No active cardiopulmonary disease. Electronically Signed   By: Elgie Collard M.D.   On: 12/21/2023 15:41      PROCEDURES:  Critical Care performed: No  Procedures   MEDICATIONS ORDERED IN ED: Medications  albuterol (PROVENTIL) (2.5 MG/3ML) 0.083% nebulizer solution 3 mL (has no administration in time range)  metoprolol tartrate (LOPRESSOR) tablet 25 mg (has no administration in time range)  ipratropium-albuterol (DUONEB) 0.5-2.5 (3) MG/3ML nebulizer solution 3 mL (has no administration in time range)  acetaminophen (TYLENOL) tablet 650 mg (has no administration in time range)    Or  acetaminophen (TYLENOL) suppository 650 mg (has no administration in time range)  ondansetron (ZOFRAN) tablet 4 mg (has no administration in time range)    Or  ondansetron (ZOFRAN) injection 4 mg (has no administration in time range)  enoxaparin (LOVENOX) injection 60 mg (has no administration in time range)  senna-docusate (Senokot-S) tablet 1 tablet (has no administration in time range)  predniSONE (DELTASONE) tablet 60 mg (60 mg Oral Given 12/21/23 1609)  ipratropium-albuterol (DUONEB) 0.5-2.5 (3) MG/3ML nebulizer solution 3 mL (3 mLs Nebulization Given 12/21/23 1606)  ipratropium-albuterol (DUONEB) 0.5-2.5 (3) MG/3ML nebulizer solution 3 mL (3 mLs Nebulization Given 12/21/23 1606)     IMPRESSION / MDM / ASSESSMENT AND PLAN / ED COURSE  I reviewed the triage vital signs and the nursing notes.                              Differential diagnosis includes, but is not limited to, COPD pneumonia reactive airway disease, no acute cardiac symptoms no clinical signs or symptoms highly suggestive of thromboembolism.  No evidence of pneumothorax.  Lab work relatively reassuring  Patient's presentation is most consistent with acute complicated illness / injury requiring diagnostic workup.      Clinical Course as of 12/21/23 1734  Thu Dec 21, 2023  1548 Noted RSV  positive.  Chest x-ray interpreted as clear.  No evidence of pneumonia given his symptoms and positive RSV status consistent with RSV with associated COPD exacerbation. [MQ]  1549 Discussed with patient, agreeable with plan for treatment at this time. [MQ]  1654 Reexamination patient's reports feeling less short of breath currently on 2 L but moderately tachypneic respiratory 25-30.  Moderate expiratory wheezing throughout still despite nebulizer treatments.  Given  his clinical history, prior medical history, positive RSV status and his ongoing symptoms I think he would best be evaluated for admission versus observation for COPD treatment and further care and treatment with the hospitalist service.  Patient is agreeable with this plan [MQ]    Clinical Course User Index [MQ] Sharyn Creamer, MD   ----------------------------------------- 5:33 PM on 12/21/2023 ----------------------------------------- Admitted to service of hospitalist, Dr. Sedalia Muta  FINAL CLINICAL IMPRESSION(S) / ED DIAGNOSES   Final diagnoses:  COPD exacerbation (HCC)  RSV bronchitis     Rx / DC Orders   ED Discharge Orders     None        Note:  This document was prepared using Dragon voice recognition software and may include unintentional dictation errors.   Sharyn Creamer, MD 12/21/23 [phone]

## 2023-12-21 NOTE — Hospital Course (Addendum)
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  Patient was found to have RSV positive, COPD exacerbation.  Started on scheduled bronchodilator and steroids.

## 2023-12-21 NOTE — Assessment & Plan Note (Signed)
Fall precautions.  

## 2023-12-21 NOTE — Assessment & Plan Note (Signed)
-   As needed nicotine patch ordered for nicotine craving 

## 2023-12-21 NOTE — Assessment & Plan Note (Signed)
Patient is on chronic 2 L nasal cannula, has not had access to oxygen due to power outage and not able to pay electrical bills

## 2023-12-21 NOTE — Assessment & Plan Note (Addendum)
Apixaban 5 mg p.o. twice daily resumed on admission Diltiazem 180 mg daily resumed Home metoprolol 50 mg BID resumed Metoprolol tartrate 5 mg IV every 4 hours as needed for heart rate greater than 120, 3 doses ordered

## 2023-12-21 NOTE — Assessment & Plan Note (Signed)
Home metoprolol tartrate 50 mg p.o. twice daily, spironolactone 12.5 mg p.o. daily, diltiazem 180 mg daily resumed on admission Hydralazine 5 mg IV every 6 hours as needed for SBP greater 165, 4 days ordered

## 2023-12-21 NOTE — ED Triage Notes (Signed)
First Nurse Note: Patient to ED via [facility] from home for SOB and cough. Pt suppose to eb wears 2L [place] at home but not due to no power at home. Given 1 albuterol and 1 duoneb with EMS. Aslo solu-medrol.  20 L Hand

## 2023-12-21 NOTE — Assessment & Plan Note (Addendum)
Secondary to patient losing power and not being able to use his O2 supplementation complicated by RSV infection Duoneb scheduled, 3 doses ordered Resumed home maintenance inhaler

## 2023-12-21 NOTE — Assessment & Plan Note (Signed)
 -  Lidocaine patch ordered

## 2023-12-21 NOTE — Assessment & Plan Note (Signed)
 TOC has been consulted

## 2023-12-21 NOTE — Assessment & Plan Note (Signed)
Secondary to COPD exacerbation, treat per above

## 2023-12-22 DIAGNOSIS — J9621 Acute and chronic respiratory failure with hypoxia: Secondary | ICD-10-CM | POA: Diagnosis not present

## 2023-12-22 DIAGNOSIS — I480 Paroxysmal atrial fibrillation: Secondary | ICD-10-CM

## 2023-12-22 DIAGNOSIS — J441 Chronic obstructive pulmonary disease with (acute) exacerbation: Secondary | ICD-10-CM | POA: Diagnosis not present

## 2023-12-22 LAB — URINALYSIS, W/ REFLEX TO CULTURE (INFECTION SUSPECTED)
Bacteria, UA: NONE SEEN
Bilirubin Urine: NEGATIVE
Glucose, UA: >=500 mg/dL — AB
Hgb urine dipstick: NEGATIVE
Ketones, ur: NEGATIVE mg/dL
Leukocytes,Ua: NEGATIVE
Nitrite: NEGATIVE
Protein, ur: NEGATIVE mg/dL
Specific Gravity, Urine: 1.021 (ref 1.005–1.030)
Squamous Epithelial / HPF: 0 /[HPF] (ref 0–5)
pH: 6 (ref 5.0–8.0)

## 2023-12-22 LAB — BASIC METABOLIC PANEL
Anion gap: 9 (ref 5–15)
BUN: 10 mg/dL (ref 8–23)
CO2: 29 mmol/L (ref 22–32)
Calcium: 8.3 mg/dL — ABNORMAL LOW (ref 8.9–10.3)
Chloride: 102 mmol/L (ref 98–111)
Creatinine, Ser: 0.85 mg/dL (ref 0.61–1.24)
GFR, Estimated: >60 mL/min (ref >60)
Glucose, Bld: 140 mg/dL — ABNORMAL HIGH (ref 70–99)
Potassium: 4.6 mmol/L (ref 3.5–5.1)
Sodium: 140 mmol/L (ref 135–145)

## 2023-12-22 LAB — CBC
HCT: 49.4 % (ref 39.0–52.0)
Hemoglobin: 15.8 g/dL (ref 13.0–17.0)
MCH: 31.5 pg (ref 26.0–34.0)
MCHC: 32 g/dL (ref 30.0–36.0)
MCV: 98.4 fL (ref 80.0–100.0)
Platelets: 164 10*3/uL (ref 150–400)
RBC: 5.02 MIL/uL (ref 4.22–5.81)
RDW: 14.7 % (ref 11.5–15.5)
WBC: 5 10*3/uL (ref 4.0–10.5)
nRBC: 0 % (ref 0.0–0.2)

## 2023-12-22 MED ORDER — IPRATROPIUM-ALBUTEROL 0.5-2.5 (3) MG/3ML IN SOLN
3.0000 mL | RESPIRATORY_TRACT | Status: DC
Start: 2023-12-22 — End: 2023-12-23
  Administered 2023-12-22 – 2023-12-23 (×3): 3 mL via RESPIRATORY_TRACT
  Filled 2023-12-22 (×3): qty 3

## 2023-12-22 MED ORDER — PREDNISONE 50 MG PO TABS
60.0000 mg | ORAL_TABLET | Freq: Every day | ORAL | Status: DC
  Administered 2023-12-22 – 2023-12-24 (×3): 60 mg via ORAL
  Filled 2023-12-22 (×2): qty 1
  Filled 2023-12-22: qty 3

## 2023-12-22 MED ORDER — IPRATROPIUM-ALBUTEROL 0.5-2.5 (3) MG/3ML IN SOLN: 3.0000 mL | RESPIRATORY_TRACT | Status: DC | PRN

## 2023-12-22 NOTE — ED Notes (Addendum)
Pt given cereal and ginger ale per request

## 2023-12-22 NOTE — TOC Initial Note (Addendum)
Transition of Care [facility]) - Initial/Assessment Note    Patient Details  Name: Kenneth Benson MRN: [id number] Date of Birth: 10-Jan-1961  Transition of Care [facility]) CM/SW Contact:    Margarito Liner, LCSW Phone Number: 12/22/2023, 12:50 PM  Clinical Narrative:  Readmission prevention screen complete. CSW met with patient. No supports at bedside. CSW introduced role and explained that discharge planning would be discussed. Patient does not have a PCP. He stated a barrier is not having an ID. CSW asked if he has gone to the [facility] to request one but he said they need several items showing proof of identification. TOC discussed this with him in September. CSW encouraged him to reach out to his Medicaid social worker for assistance with getting a PCP. [facility]acy is [facility] [address]. He reports difficulty affording the $4 copays. Patient does not have any income. Patient lives with his girlfriend and friend. Patient was recently incarcerated for 90 days. When he came home earlier this month, he had no power or water. He stated the bill is around $800 and he is also supposed to move out of his home on 1/31. No home health prior to admission. Patient has oxygen, a bipap, and a nebulizer machine through [facility]. CSW is waiting on a call back from [facility] director to discuss power/bill concerns since patient requires oxygen. No further concerns. CSW will continue to follow patient for support and facilitate return home when stable. He will likely need a cab at discharge.          5:00 pm: Discussed case with TOC director. Unable to provide assistance since patient has back bills that have not been paid and no plan to pay future bills.      Expected Discharge Plan: Home/Self Care Barriers to Discharge: Continued Medical Work up   Patient Goals and CMS Choice            Expected Discharge Plan and Services     Post Acute Care Choice: NA Living arrangements for the past 2 months: Single Family Home                                       Prior Living Arrangements/Services Living arrangements for the past 2 months: Single Family Home Lives with:: Significant Other, Friends Patient language and need for interpreter reviewed:: Yes Do you feel safe going back to the place where you live?: Yes      Need for Family Participation in Patient Care: Yes (Comment) Care giver support system in place?: Yes (comment) Current home services: DME Criminal Activity/Legal Involvement Pertinent to Current Situation/Hospitalization: No - Comment as needed  Activities of Daily Living      Permission Sought/Granted                  Emotional Assessment Appearance:: Appears stated age Attitude/Demeanor/Rapport: Engaged, Gracious Affect (typically observed): Accepting, Appropriate, Calm, Pleasant Orientation: : Oriented to Self, Oriented to Place, Oriented to  Time, Oriented to Situation Alcohol / Substance Use: Not Applicable Psych Involvement: No (comment)  Admission diagnosis:  COPD exacerbation (HCC) [J44.1] Patient Active Problem List   Diagnosis Date Noted   Morbid obesity (HCC) 12/21/2023   Paroxysmal atrial fibrillation (HCC) 12/21/2023   Financial difficulties 12/21/2023   Musculoskeletal chest pain 12/21/2023   COPD exacerbation (HCC) 08/23/2023   Chronic hypoxic respiratory failure (HCC) 08/22/2023   SOB (shortness of breath) 07/02/2023  HTN (hypertension) 04/13/2023   Lung nodule 04/13/2023   Obesity (BMI 30-39.9) 04/13/2023   Asthmatic bronchitis , chronic (HCC) 02/12/2023   Moderate tricuspid regurgitation 12/21/2022   Paroxysmal atrial fibrillation with RVR (HCC) 12/16/2022   Chronic combined systolic and diastolic heart failure (HCC) 12/16/2022   Physical deconditioning 02/07/2022   Dysphagia 02/06/2022   Asthma exacerbation 01/21/2022   Cigarette smoker 01/21/2022   Acute on chronic respiratory failure with hypoxia (HCC)    PCP:  Pcp, No Pharmacy:   [facility]  REGIONAL - [facility] [address] [place] [address] Phone: [phone] Fax: [phone]  [facility] - [facility] [address] [place] [address] Phone: [phone] Fax: [phone]     Social Drivers of Health (SDOH) Social History: SDOH Screenings   Food Insecurity: No Food Insecurity (08/22/2023)  Housing: Low Risk  (08/22/2023)  Transportation Needs: No Transportation Needs (08/22/2023)  Utilities: Not At Risk (08/22/2023)  Tobacco Use: High Risk (12/21/2023)   SDOH Interventions:     Readmission Risk Interventions    12/22/2023   12:48 PM  Readmission Risk Prevention Plan  Transportation Screening Complete  PCP or Specialist Appt within 3-5 Days Complete  Social Work Consult for Recovery Care Planning/Counseling Complete  Palliative Care Screening Not Applicable  Medication Review Oceanographer) Complete

## 2023-12-22 NOTE — ED Notes (Signed)
Pt given snacks and something to drink.

## 2023-12-22 NOTE — Evaluation (Signed)
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     Patient was found to have RSV positive, COPD exacerbation.  Started on scheduled bronchodilator and steroids   Clinical Impression  Patient received in bed he is agreeable to PT assessment. Patient is on home O2 at baseline, however power was turned off so he had not been using it. Patient also now with RSV. He is mod independent with bed mobility and transfers with supervision. Patient is able to ambulate short distance in room without AD, however O2 saturations down to 86% with activity on 2 liters. Patient will continue to benefit from skilled PT to improve endurance and safety with mobility.         If plan is discharge home, recommend the following: Help with stairs or ramp for entrance;Assist for transportation;A little help with walking and/or transfers   Can travel by private vehicle    yes    Equipment Recommendations None recommended by PT  Recommendations for Other Services       Functional Status Assessment Patient has had a recent decline in their functional status and demonstrates the ability to make significant improvements in function in a reasonable and predictable amount of time.     Precautions / Restrictions Precautions Precaution Comments: low fall Restrictions Weight Bearing Restrictions Per Provider Order: No      Mobility  Bed Mobility Overal bed mobility: Independent                  Transfers Overall transfer level: Needs assistance Equipment used: None Transfers: Sit to/from Stand Sit to Stand:  Supervision                Ambulation/Gait Ambulation/Gait assistance: Supervision Gait Distance (Feet): 10 Feet Assistive device: None Gait Pattern/deviations: Step-through pattern, Decreased step length - right, Decreased step length - left, Drifts right/left Gait velocity: WFL     General Gait Details: patient ambulated a short distance in room without AD. CGA, no overt LOB. Limited by SOB and decreased O2 sats with mobility. Down to 86%. With seated rest and PLB patient increased O2 sats to 93-94%.  Stairs            Wheelchair Mobility     Tilt Bed    Modified Rankin (Stroke Patients Only)       Balance Overall balance assessment: Mild deficits observed, not formally tested, Needs assistance Sitting-balance support: Feet supported Sitting balance-Leahy Scale: Normal     Standing balance support: No upper extremity supported, During functional activity Standing balance-Leahy Scale: Fair Standing balance comment: cga to supervision for limited mobility in room.                             Pertinent Vitals/Pain Pain Assessment Pain Assessment: No/denies pain    Home Living Family/patient expects to be discharged to:: Private residence Living Arrangements: Spouse/significant other Available Help at Discharge: Family;Available 24 hours/day Type of Home: Mobile home Home Access: Stairs to enter Entrance Stairs-Rails: Can reach both;Left;Right Entrance Stairs-Number of Steps: 5   Home Layout: One level Home Equipment: Agricultural consultant (2 wheels) Additional Comments: Has 2 walkers, but does  not use. Reports no falls.    Prior Function Prior Level of Function : Independent/Modified Independent             Mobility Comments: amb household/community distances with no AD; reports hip/knee pain ADLs Comments: generally MOD I with ADL/IADL, does not drive or work     Extremity/Trunk Assessment   Upper Extremity Assessment Upper Extremity  Assessment: Overall WFL for tasks assessed    Lower Extremity Assessment Lower Extremity Assessment: Generalized weakness    Cervical / Trunk Assessment Cervical / Trunk Assessment: Normal  Communication   Communication Communication: No apparent difficulties Cueing Techniques: Verbal cues  Cognition Arousal: Alert Behavior During Therapy: WFL for tasks assessed/performed Overall Cognitive Status: Within Functional Limits for tasks assessed                                          General Comments      Exercises     Assessment/Plan    PT Assessment Patient needs continued PT services  PT Problem List Decreased strength;Decreased activity tolerance;Decreased balance;Decreased mobility;Cardiopulmonary status limiting activity       PT Treatment Interventions Gait training;Stair training;Functional mobility training;Therapeutic activities;Therapeutic exercise;Balance training;Neuromuscular re-education;Patient/family education;DME instruction    PT Goals (Current goals can be found in the Care Plan section)  Acute Rehab PT Goals Patient Stated Goal: to return home, feel better PT Goal Formulation: With patient Time For Goal Achievement: 12/29/23 Potential to Achieve Goals: Good    Frequency Min 1X/week     Co-evaluation               AM-PAC PT "6 Clicks" Mobility  Outcome Measure Help needed turning from your back to your side while in a flat bed without using bedrails?: None Help needed moving from lying on your back to sitting on the side of a flat bed without using bedrails?: None Help needed moving to and from a bed to a chair (including a wheelchair)?: A Little Help needed standing up from a chair using your arms (e.g., wheelchair or bedside chair)?: None Help needed to walk in hospital room?: A Little Help needed climbing 3-5 steps with a railing? : A Little 6 Click Score: 21    End of Session Equipment Utilized During Treatment:  Oxygen Activity Tolerance: Patient limited by fatigue;Other (comment) (SOB, O2 desaturation) Patient left: in bed;with call bell/phone within reach Nurse Communication: Mobility status PT Visit Diagnosis: Muscle weakness (generalized) (M62.81);Difficulty in walking, not elsewhere classified (R26.2)    Time: 1345-1400 PT Time Calculation (min) (ACUTE ONLY): 15 min   Charges:   PT Evaluation $PT Eval Moderate Complexity: 1 Mod   PT General Charges $$ ACUTE PT VISIT: 1 Visit         Casyn Becvar, PT, GCS 12/22/23,2:14 PM

## 2023-12-22 NOTE — Progress Notes (Signed)
  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  Patient was found to have RSV positive, COPD exacerbation.  Started on scheduled bronchodilator and steroids.   Principal Problem:   COPD exacerbation (HCC) Active Problems:   Cigarette smoker   Physical deconditioning   HTN (hypertension)   Chronic hypoxic respiratory failure (HCC)   SOB (shortness of breath)   Morbid obesity (HCC)   Paroxysmal atrial fibrillation (HCC)   Financial difficulties   Musculoskeletal chest pain   Assessment and Plan: * COPD exacerbation (HCC) Acute on chronic hypoxemic respiratory failure. RSV infection. Tobacco abuse. Patient was chronically on 2 L oxygen, she ran out of oxygen before admission, he also infected with RSV.  He also restarted smoking about 2 or 3 days ago.  He came to the hospital with worsening shortness of breath, had significant hypoxemia, was placed on 4 L oxygen which is worse than baseline. He still has significant outburst today, will continue current treatment with steroids and scheduled DuoNeb.  HTN (hypertension) Resume home medicines.  Physical deconditioning Fall precautions, PT  Financial difficulties TOC has been consulted  Paroxysmal atrial fibrillation (HCC) Apixaban 5 mg p.o. twice daily resumed on admission Diltiazem 180 mg daily resumed Home metoprolol 50 mg BID resumed   Morbid obesity (HCC) BMI 37.97 with comorbidities. Diet and exercise.       Subjective:  Patient still complains significant short of breath and wheezing.  Still requiring  4 L oxygen.  Physical Exam: Vitals:   12/22/23 0241 12/22/23 0445 12/22/23 0530 12/22/23 0622  BP:    107/82  Pulse:  100 97 (!) 115  Resp:  (!) 26 (!) 31 (!) 21  Temp: 98.9 F (37.2 C)   98.2 F (36.8 C)  TempSrc: Oral   Oral  SpO2:  94% 93% 97%  Weight:      Height:       General exam: Appears calm and comfortable  Respiratory system: Decreased breathing sounds with wheezes. Respiratory effort normal. Cardiovascular system: S1 & S2 heard, RRR. No JVD, murmurs, rubs, gallops or clicks. No pedal edema. Gastrointestinal system: Abdomen is nondistended, soft and nontender. No organomegaly or masses felt. Normal bowel sounds heard. Central nervous system: Alert and oriented. No focal neurological deficits. Extremities: Symmetric 5 x 5 power. Skin: No rashes, lesions or ulcers Psychiatry: Judgement and insight appear normal. Mood & affect appropriate.    Data Reviewed:  Reviewed chest x-ray and lab results.  Family Communication: None  Disposition: Status is: Inpatient Remains inpatient appropriate because: Severity of disease     Time spent: 35 minutes  Author: Marrion Coy, MD 12/22/2023 10:46 AM  For on call review [email or web address].

## 2023-12-22 NOTE — Progress Notes (Signed)
Mobility Specialist - Progress Note     12/22/23 1540  Mobility  Activity Stood at bedside;Ambulated with assistance in hallway  Level of Assistance Standby assist, set-up cues, supervision of patient - no hands on  Assistive Device None  Distance Ambulated (ft) 120 ft  Range of Motion/Exercises Active  Activity Response Tolerated well  Mobility Referral Yes  Mobility visit 1 Mobility  Mobility Specialist Start Time (ACUTE ONLY) 1513  Mobility Specialist Stop Time (ACUTE ONLY) 1532  Mobility Specialist Time Calculation (min) (ACUTE ONLY) 19 min   Pt resting in bed on 2L upon entry. Pt STS and ambulates around NS C pod ED SBA with no RW. Pt SpO2:92 pre-amb and SpO2: 90 post amb.  Pt maintained upright walking through session with no LOB. Pt endorses SOB during end of session. Pt returned to bed and left with needs in reach.   Kenneth Benson Mobility Specialist 12/22/23, 3:52 PM

## 2023-12-23 DIAGNOSIS — J441 Chronic obstructive pulmonary disease with (acute) exacerbation: Secondary | ICD-10-CM | POA: Diagnosis not present

## 2023-12-23 DIAGNOSIS — J9621 Acute and chronic respiratory failure with hypoxia: Secondary | ICD-10-CM | POA: Diagnosis not present

## 2023-12-23 DIAGNOSIS — I480 Paroxysmal atrial fibrillation: Secondary | ICD-10-CM | POA: Diagnosis not present

## 2023-12-23 MED ORDER — IPRATROPIUM-ALBUTEROL 0.5-2.5 (3) MG/3ML IN SOLN
3.0000 mL | Freq: Four times a day (QID) | RESPIRATORY_TRACT | Status: DC
Start: 2023-12-23 — End: 2023-12-24
  Administered 2023-12-23 – 2023-12-24 (×6): 3 mL via RESPIRATORY_TRACT
  Filled 2023-12-23 (×6): qty 3

## 2023-12-23 MED ORDER — SENNOSIDES-DOCUSATE SODIUM 8.6-50 MG PO TABS
2.0000 | ORAL_TABLET | Freq: Two times a day (BID) | ORAL | Status: DC
  Administered 2023-12-23 (×2): 2 via ORAL
  Filled 2023-12-23 (×3): qty 2

## 2023-12-23 NOTE — Progress Notes (Signed)
  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  Patient was found to have RSV positive, COPD exacerbation.  Started on scheduled bronchodilator and steroids.   Principal Problem:   COPD exacerbation (HCC) Active Problems:   Cigarette smoker   Physical deconditioning   HTN (hypertension)   Acute on chronic respiratory failure with hypoxia (HCC)   Chronic hypoxic respiratory failure (HCC)   SOB (shortness of breath)   Morbid obesity (HCC)   Paroxysmal atrial fibrillation (HCC)   Financial difficulties   Musculoskeletal chest pain   Assessment and Plan: * COPD exacerbation (HCC) Acute on chronic hypoxemic respiratory failure. RSV infection. Tobacco abuse. Patient was chronically on 2 L oxygen, she ran out of oxygen before admission, he also infected with RSV.  He also restarted smoking about 2 or 3 days ago.  He came to the hospital with worsening shortness of breath, had significant hypoxemia, was placed on 4 L oxygen which is worse than baseline. Patient condition is improving after treatment.  Oxygenation gradually improving.  However, still has significant short of breath, not at baseline.  Continue treatment in the hospital.   HTN (hypertension) Resume home medicines.   Physical deconditioning Fall precautions, PT   Financial difficulties TOC has been consulted   Paroxysmal atrial fibrillation (HCC) Apixaban 5 mg p.o. twice daily resumed on admission Diltiazem 180 mg daily resumed Home metoprolol 50 mg BID resumed     Morbid obesity (HCC) BMI 37.97 with  comorbidities. Diet and exercise.      Subjective:  Still having short of breath with exertion, some wheezing.  Physical Exam: Vitals:   12/22/23 2047 12/23/23 0035 12/23/23 0142 12/23/23 0914  BP: 128/79 100/68  125/70  Pulse: 98 99  61  Resp: 20 19  18   Temp: 98.7 F (37.1 C) 98.7 F (37.1 C)  98.8 F (37.1 C)  TempSrc: Oral   Oral  SpO2: 93% (!) 88% 94% 94%  Weight:      Height:       General exam: Appears calm and comfortable  Respiratory system: Decreased breathing sounds. Respiratory effort normal. Cardiovascular system: S1 & S2 heard, RRR. No JVD, murmurs, rubs, gallops or clicks. No pedal edema. Gastrointestinal system: Abdomen is nondistended, soft and nontender. No organomegaly or masses felt. Normal bowel sounds heard. Central nervous system: Alert and oriented. No focal neurological deficits. Extremities: Symmetric 5 x 5 power. Skin: No rashes, lesions or ulcers Psychiatry: Judgement and insight appear normal. Mood & affect appropriate.    Data Reviewed:  Lab results reviewed.  Family Communication: None  Disposition: Status is: Inpatient Remains inpatient appropriate because: Severity of disease,     Time spent: 35 minutes  Author: Marrion Coy, MD 12/23/2023 12:30 PM  For on call review [email or web address].

## 2023-12-23 NOTE — Plan of Care (Signed)
Problem: Education: Goal: Knowledge of General Education information will improve Description: Including pain rating scale, medication(s)/side effects and non-pharmacologic comfort measures Outcome: Progressing   Problem: Clinical Measurements: Goal: Respiratory complications will improve Outcome: Progressing Goal: Cardiovascular complication will be avoided Outcome: Progressing   Problem: Activity: Goal: Risk for activity intolerance will decrease Outcome: Progressing   Problem: Safety: Goal: Ability to remain free from injury will improve Outcome: Progressing

## 2023-12-23 NOTE — Plan of Care (Signed)

## 2023-12-24 DIAGNOSIS — R5381 Other malaise: Secondary | ICD-10-CM

## 2023-12-24 DIAGNOSIS — J441 Chronic obstructive pulmonary disease with (acute) exacerbation: Secondary | ICD-10-CM | POA: Diagnosis not present

## 2023-12-24 DIAGNOSIS — J9621 Acute and chronic respiratory failure with hypoxia: Secondary | ICD-10-CM | POA: Diagnosis not present

## 2023-12-24 MED ORDER — PREDNISONE 20 MG PO TABS: ORAL_TABLET | ORAL | 0 refills | Status: DC

## 2023-12-24 NOTE — Discharge Summary (Signed)
Physician Discharge Summary   Patient: Kenneth Benson MRN: [id number] DOB: 07/30/1961  Admit date:     12/21/2023  Discharge date: 12/24/23  Discharge Physician: Marrion Coy   PCP: Pcp, No   Recommendations at discharge:   Follow-up with PCP in 1 week, TOC to set up.  Discharge Diagnoses: Principal Problem:   COPD exacerbation (HCC) Active Problems:   Cigarette smoker   Physical deconditioning   HTN (hypertension)   Acute on chronic respiratory failure with hypoxia (HCC)   Chronic hypoxic respiratory failure (HCC)   SOB (shortness of breath)   Morbid obesity (HCC)   Paroxysmal atrial fibrillation (HCC)   Financial difficulties   Musculoskeletal chest pain  Resolved Problems:   * No resolved hospital problems. 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  Patient was found to have RSV positive, COPD exacerbation.  Started on scheduled bronchodilator and steroids.  Patient condition finally improved, medically stable for discharge. Assessment and Plan: * COPD exacerbation (HCC) Acute on chronic hypoxemic respiratory failure. RSV infection. Tobacco abuse. Patient was chronically on 2 L oxygen, she ran out of oxygen before admission, he also infected with RSV.  He also restarted smoking about 2 or 3 days ago.  He came to the hospital with worsening shortness of breath, had significant hypoxemia, was placed on 4 L oxygen which is worse than baseline. Patient condition is improving after treatment.  Oxygenation gradually improving.   Medically stable for discharge.   HTN (hypertension) Resume home medicines.   Physical deconditioning Home PT.   Financial difficulties TOC has been consulted Patient states  that he has medicine card, he can fill his medicine without co-pay.  He has oxygen at home.  His home has gas, but no electricity. TOC to help to set up home care and any additional needs.   Paroxysmal atrial fibrillation (HCC) Apixaban 5 mg p.o. twice daily resumed on admission Diltiazem 180 mg daily resumed Home metoprolol 50 mg BID resumed     Morbid obesity (HCC) BMI 37.97 with comorbidities. Diet and exercise.         Consultants: None Procedures performed: None  Disposition: Home health Diet recommendation:  Discharge Diet Orders (From admission, onward)     Start     Ordered   12/24/23 0000  Diet - low sodium heart healthy        12/24/23 1028           Cardiac diet DISCHARGE MEDICATION: Allergies as of 12/24/2023   No Known Allergies      Medication List     TAKE these medications    albuterol (2.5 MG/3ML) 0.083% nebulizer solution Commonly known as: PROVENTIL Take 3 mLs (2.5 mg total) by nebulization every 6 (six) hours as needed for wheezing or shortness of breath.   Ventolin HFA 108 (90 Base) MCG/ACT inhaler Generic drug: albuterol Inhale 2 puffs into the lungs every 6 (six) hours as needed for wheezing or shortness of breath.   arformoterol 15 MCG/2ML Nebu Commonly known as: Brovana Take 2 mLs (15 mcg total) by nebulization 2 (two) times daily.   digoxin 0.125 MG tablet Commonly known as: LANOXIN Take 1 tablet (0.125 mg total) by mouth daily.   diltiazem 180 MG 24 hr capsule Commonly known as: CARDIZEM CD Take 1 capsule (  180 mg total) by mouth daily.   Eliquis 5 MG Tabs tablet Generic drug: apixaban Take 1 tablet (5 mg total) by mouth 2 (two) times daily.   Jardiance 10 MG Tabs tablet Generic drug: empagliflozin Take 1 tablet (10 mg total) by mouth daily.   metoprolol tartrate 50 MG tablet Commonly known as: LOPRESSOR Take 1 tablet (50 mg total) by mouth 2 (two) times daily.   predniSONE 20 MG tablet Commonly known as:  DELTASONE Take 2 tablets (40 mg total) by mouth daily with breakfast for 3 days, THEN 1 tablet (20 mg total) daily with breakfast for 3 days. Start taking on: December 24, 2023   spironolactone 25 MG tablet Commonly known as: ALDACTONE Take 0.5 tablets (12.5 mg total) by mouth daily. Hold this medication until you see your cardiologist   torsemide 20 MG tablet Commonly known as: DEMADEX Take 2 tablets (40 mg total) by mouth daily. Hold this medication until you see your cardiologist        Discharge Exam: Filed Weights   12/21/23 1420  Weight: 127 kg   General exam: Appears calm and comfortable  Respiratory system: Decreased breath sounds with some wheezes, but much improved. Respiratory effort normal. Cardiovascular system: S1 & S2 heard, RRR. No JVD, murmurs, rubs, gallops or clicks. No pedal edema. Gastrointestinal system: Abdomen is nondistended, soft and nontender. No organomegaly or masses felt. Normal bowel sounds heard. Central nervous system: Alert and oriented. No focal neurological deficits. Extremities: Symmetric 5 x 5 power. Skin: No rashes, lesions or ulcers Psychiatry: Judgement and insight appear normal. Mood & affect appropriate.    Condition at discharge: fair  The results of significant diagnostics from this hospitalization (including imaging, microbiology, ancillary and laboratory) are listed below for reference.   Imaging Studies: DG Chest 2 View Result Date: 12/21/2023 CLINICAL DATA:  Shortness of breath. EXAM: CHEST - 2 VIEW COMPARISON:  Chest radiograph dated 09/01/2023. FINDINGS: The heart size and mediastinal contours are within normal limits. Both lungs are clear. The visualized skeletal structures are unremarkable. IMPRESSION: No active cardiopulmonary disease. Electronically Signed   By: Elgie Collard M.D.   On: 12/21/2023 15:41    Microbiology: Results for orders placed or performed during the hospital encounter of 12/21/23  Resp panel by  RT-PCR (RSV, Flu A&B, Covid) Anterior Nasal Swab     Status: Abnormal   Collection Time: 12/21/23  2:25 PM   Specimen: Anterior Nasal Swab  Result Value Ref Range Status   SARS Coronavirus 2 by RT PCR NEGATIVE NEGATIVE Final    Comment: (NOTE) SARS-CoV-2 target nucleic acids are NOT DETECTED.  The SARS-CoV-2 RNA is generally detectable in upper respiratory specimens during the acute phase of infection. The lowest concentration of SARS-CoV-2 viral copies this assay can detect is 138 copies/mL. A negative result does not preclude SARS-Cov-2 infection and should not be used as the sole basis for treatment or other patient management decisions. A negative result may occur with  improper specimen collection/handling, submission of specimen other than nasopharyngeal swab, presence of viral mutation(s) within the areas targeted by this assay, and inadequate number of viral copies(<138 copies/mL). A negative result must be combined with clinical observations, patient history, and epidemiological information. The expected result is Negative.  Fact Sheet for Patients:  BloggerCourse.com  Fact Sheet for Healthcare Providers:  SeriousBroker.it  This test is no t yet approved or cleared by the [place] FDA and  has been authorized for detection and/or diagnosis of SARS-CoV-2 by FDA  under an Emergency Use Authorization (EUA). This EUA will remain  in effect (meaning this test can be used) for the duration of the COVID-19 declaration under Section 564(b)(1) of the Act, 21 U.S.C.section 360bbb-3(b)(1), unless the authorization is terminated  or revoked sooner.       Influenza A by PCR NEGATIVE NEGATIVE Final   Influenza B by PCR NEGATIVE NEGATIVE Final    Comment: (NOTE) The Xpert Xpress SARS-CoV-2/FLU/RSV plus assay is intended as an aid in the diagnosis of influenza from Nasopharyngeal swab specimens and should not be used as a sole  basis for treatment. Nasal washings and aspirates are unacceptable for Xpert Xpress SARS-CoV-2/FLU/RSV testing.  Fact Sheet for Patients: BloggerCourse.com  Fact Sheet for Healthcare Providers: SeriousBroker.it  This test is not yet approved or cleared by the [place] FDA and has been authorized for detection and/or diagnosis of SARS-CoV-2 by FDA under an Emergency Use Authorization (EUA). This EUA will remain in effect (meaning this test can be used) for the duration of the COVID-19 declaration under Section 564(b)(1) of the Act, 21 U.S.C. section 360bbb-3(b)(1), unless the authorization is terminated or revoked.     Resp Syncytial Virus by PCR POSITIVE (A) NEGATIVE Final    Comment: (NOTE) Fact Sheet for Patients: BloggerCourse.com  Fact Sheet for Healthcare Providers: SeriousBroker.it  This test is not yet approved or cleared by the [place] FDA and has been authorized for detection and/or diagnosis of SARS-CoV-2 by FDA under an Emergency Use Authorization (EUA). This EUA will remain in effect (meaning this test can be used) for the duration of the COVID-19 declaration under Section 564(b)(1) of the Act, 21 U.S.C. section 360bbb-3(b)(1), unless the authorization is terminated or revoked.  Performed at [facility], [address] [place], [place] [address]   Blood Culture (routine x 2)     Status: None (Preliminary result)   Collection Time: 12/21/23  2:56 PM   Specimen: BLOOD  Result Value Ref Range Status   Specimen Description BLOOD BLOOD RIGHT HAND  Final   Special Requests   Final    BOTTLES DRAWN AEROBIC AND ANAEROBIC Blood Culture adequate volume   Culture   Final    NO GROWTH 3 DAYS Performed at [facility], [address] [place], [place] [address]    Report Status PENDING  Incomplete  Blood Culture (routine x 2)     Status:  None (Preliminary result)   Collection Time: 12/21/23  2:58 PM   Specimen: BLOOD  Result Value Ref Range Status   Specimen Description BLOOD BLOOD LEFT HAND  Final   Special Requests   Final    BOTTLES DRAWN AEROBIC AND ANAEROBIC Blood Culture adequate volume   Culture   Final    NO GROWTH 3 DAYS Performed at [facility], [address] [place], [place] [address]    Report Status PENDING  Incomplete    Labs: CBC: Recent Labs  Lab 12/21/23 1425 12/22/23 0642  WBC 4.6 5.0  HGB 16.8 15.8  HCT 51.8 49.4  MCV 95.6 98.4  PLT 201 164   Basic Metabolic Panel: Recent Labs  Lab 12/21/23 1425 12/22/23 0642  NA 141 140  K 4.9 4.6  CL 104 102  CO2 26 29  GLUCOSE 102* 140*  BUN 6* 10  CREATININE 0.89 0.85  CALCIUM 8.4* 8.3*   Liver Function Tests: Recent Labs  Lab 12/21/23 1425  AST 27  ALT 18  ALKPHOS 53  BILITOT 1.5*  PROT 7.3  ALBUMIN 3.8   CBG: No results for input(s): "GLUCAP" in the last 168 hours.  Discharge time spent: greater than 30 minutes.  Signed: Marrion Coy, MD [facility] 12/24/2023

## 2023-12-24 NOTE — Progress Notes (Addendum)
1144 MD and CM made aware that pt wears O2 2L at home and does not have a ride to bring his oxygen for d/c and will need a taxi. CM will set up O2 for travel home  1211 Ride waiver signed and placed in chart, O2 to be delivered to pt room for taxi travel.  1505 O2 delivered.  1534 Taxi called  1640 IV removed. D/C AVS completed and reviewed with pt. All opportunities for questions answered and clarified. IV removed. Pt will be wheeled down to car at medical mall entrance via wheelchair.  1652 Pt being d/c at this time via wheelchair on 2L O2

## 2023-12-24 NOTE — Progress Notes (Signed)
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     Patient was found to have RSV positive, COPD exacerbation.  Started on scheduled bronchodilator and steroids    PT Comments  OOB and completes x 1 lap on unit, bathroom to void and BM then back to bed with no AD and assist for O2 tank management.  Activity with O2 at 4 LPM.  Pt stated he hopes to discharge home today but stated he still does not have electricity for O2 at home.     If plan is discharge home, recommend the following: Help with stairs or ramp for entrance;Assist for transportation   Can travel by private vehicle        Equipment Recommendations  None recommended by PT    Recommendations for Other Services       Precautions / Restrictions Precautions Precautions: None Precaution Comments: low fall Restrictions Weight Bearing Restrictions Per Provider Order: No     Mobility  Bed Mobility Overal bed mobility: Independent                  Transfers                        Ambulation/Gait Ambulation/Gait assistance: Independent Gait Distance (Feet): 190 Feet Assistive device: None Gait Pattern/deviations: Step-through pattern, Decreased step length - right, Decreased step length - left, Drifts right/left Gait velocity: WFL     General Gait Details: generally steady   Optometrist     Tilt Bed    Modified Rankin (Stroke Patients Only)       Balance Overall balance assessment: Independent   Sitting balance-Leahy Scale: Normal       Standing balance-Leahy  Scale: Good                              Cognition Arousal: Alert Behavior During Therapy: WFL for tasks assessed/performed Overall Cognitive Status: Within Functional Limits for tasks assessed                                          Exercises      General Comments        Pertinent Vitals/Pain Pain Assessment Pain Assessment: No/denies pain    Home Living                          Prior Function            PT Goals (current goals can now be found in the care plan section) Progress towards PT goals: Progressing toward goals    Frequency    Min 1X/week      PT Plan      Co-evaluation              AM-PAC PT "6 Clicks" Mobility   Outcome Measure  Help needed turning from your back to your side while in a flat bed without  using bedrails?: None Help needed moving from lying on your back to sitting on the side of a flat bed without using bedrails?: None Help needed moving to and from a bed to a chair (including a wheelchair)?: None Help needed standing up from a chair using your arms (e.g., wheelchair or bedside chair)?: None Help needed to walk in hospital room?: None Help needed climbing 3-5 steps with a railing? : A Little 6 Click Score: 23    End of Session Equipment Utilized During Treatment: Oxygen;Gait belt Activity Tolerance: Patient tolerated treatment well Patient left: in bed;with call bell/phone within reach Nurse Communication: Mobility status PT Visit Diagnosis: Muscle weakness (generalized) (M62.81);Difficulty in walking, not elsewhere classified (R26.2)     Time: 7829-5621 PT Time Calculation (min) (ACUTE ONLY): 18 min  Charges:    $Gait Training: 8-22 mins PT General Charges $$ ACUTE PT VISIT: 1 Visit                   Danielle Dess, PTA 12/24/23, 9:41 AM

## 2023-12-24 NOTE — Progress Notes (Signed)
Mobility Specialist - Progress Note    12/24/23 1335  Mobility  Activity Ambulated independently in hallway  Level of Assistance Modified independent, requires aide device or extra time  Assistive Device Front wheel walker  Distance Ambulated (ft) 180 ft  Range of Motion/Exercises Active  Activity Response Tolerated well  Mobility Referral Yes  Mobility visit 1 Mobility  Mobility Specialist Start Time (ACUTE ONLY) 1300  Mobility Specialist Stop Time (ACUTE ONLY) 1324  Mobility Specialist Time Calculation (min) (ACUTE ONLY) 24 min   Pt resting in bed on 3L upon entry. Pt STS and ambulates to hallway around NS ModI with no AD. Pt endorses SOB after 140 ft. Pt returned to bed to recover breath and left with needs in reach 90>.   Johnathan Hausen Mobility Specialist 12/24/23, 1:41 PM

## 2023-12-24 NOTE — TOC Transition Note (Signed)
Transition of Care [facility]) - Discharge Note   Patient Details  Name: Kenneth Benson MRN: [id number] Date of Birth: [birth year]  [facility] CM/SW Contact:  Maree Krabbe, LCSW Phone Number: 12/24/2023, 1:56 PM   Clinical Narrative:   trasport 02 to be delivered to the room via [employer]- RN notified of estimated time of delivery, taxi form completed and printed on the unit. Pt to complete taxi waiver and form to be placed in chart to be filled in medical records. RN to call taxi when 02 delivered.      Barriers to Discharge: Continued Medical Work up   Patient Goals and CMS Choice            Discharge Placement                       Discharge Plan and Services Additional resources added to the After Visit Summary for       Post Acute Care Choice: NA                               Social Drivers of Health (SDOH) Interventions SDOH Screenings   Food Insecurity: No Food Insecurity (12/22/2023)  Housing: Low Risk  (12/22/2023)  Transportation Needs: No Transportation Needs (12/22/2023)  Utilities: Not At Risk (12/22/2023)  Social Connections: Unknown (12/22/2023)  Tobacco Use: High Risk (12/21/2023)     Readmission Risk Interventions    12/22/2023   12:48 PM  Readmission Risk Prevention Plan  Transportation Screening Complete  PCP or Specialist Appt within 3-5 Days Complete  Social Work Consult for Recovery Care Planning/Counseling Complete  Palliative Care Screening Not Applicable  Medication Review Oceanographer) Complete

## 2023-12-25 ENCOUNTER — Emergency Department: Payer: Medicaid Other

## 2023-12-25 ENCOUNTER — Other Ambulatory Visit: Payer: Self-pay

## 2023-12-25 ENCOUNTER — Inpatient Hospital Stay
Admission: EM | Admit: 2023-12-25 | Discharge: 2024-01-01 | DRG: 190 | Disposition: A | Discharge status: Home / Self Care | Payer: Medicaid Other | Attending: Student | Admitting: Student

## 2023-12-25 DIAGNOSIS — I480 Paroxysmal atrial fibrillation: Secondary | ICD-10-CM | POA: Diagnosis present

## 2023-12-25 DIAGNOSIS — J90 Pleural effusion, not elsewhere classified: Secondary | ICD-10-CM | POA: Diagnosis present

## 2023-12-25 DIAGNOSIS — I2510 Atherosclerotic heart disease of native coronary artery without angina pectoris: Secondary | ICD-10-CM | POA: Diagnosis present

## 2023-12-25 DIAGNOSIS — Z8249 Family history of ischemic heart disease and other diseases of the circulatory system: Secondary | ICD-10-CM

## 2023-12-25 DIAGNOSIS — J9811 Atelectasis: Secondary | ICD-10-CM | POA: Diagnosis present

## 2023-12-25 DIAGNOSIS — E875 Hyperkalemia: Secondary | ICD-10-CM | POA: Diagnosis present

## 2023-12-25 DIAGNOSIS — M4856XA Collapsed vertebra, not elsewhere classified, lumbar region, initial encounter for fracture: Secondary | ICD-10-CM | POA: Diagnosis present

## 2023-12-25 DIAGNOSIS — Z7901 Long term (current) use of anticoagulants: Secondary | ICD-10-CM

## 2023-12-25 DIAGNOSIS — Z79899 Other long term (current) drug therapy: Secondary | ICD-10-CM

## 2023-12-25 DIAGNOSIS — I5042 Chronic combined systolic (congestive) and diastolic (congestive) heart failure: Secondary | ICD-10-CM | POA: Diagnosis present

## 2023-12-25 DIAGNOSIS — R079 Chest pain, unspecified: Principal | ICD-10-CM | POA: Diagnosis present

## 2023-12-25 DIAGNOSIS — Z7982 Long term (current) use of aspirin: Secondary | ICD-10-CM

## 2023-12-25 DIAGNOSIS — K4020 Bilateral inguinal hernia, without obstruction or gangrene, not specified as recurrent: Secondary | ICD-10-CM | POA: Diagnosis present

## 2023-12-25 DIAGNOSIS — K8020 Calculus of gallbladder without cholecystitis without obstruction: Secondary | ICD-10-CM | POA: Diagnosis present

## 2023-12-25 DIAGNOSIS — I110 Hypertensive heart disease with heart failure: Secondary | ICD-10-CM | POA: Diagnosis present

## 2023-12-25 DIAGNOSIS — Z7984 Long term (current) use of oral hypoglycemic drugs: Secondary | ICD-10-CM

## 2023-12-25 DIAGNOSIS — N281 Cyst of kidney, acquired: Secondary | ICD-10-CM | POA: Diagnosis present

## 2023-12-25 DIAGNOSIS — J441 Chronic obstructive pulmonary disease with (acute) exacerbation: Principal | ICD-10-CM

## 2023-12-25 DIAGNOSIS — Z6837 Body mass index (BMI) 37.0-37.9, adult: Secondary | ICD-10-CM

## 2023-12-25 DIAGNOSIS — J9601 Acute respiratory failure with hypoxia: Secondary | ICD-10-CM | POA: Diagnosis present

## 2023-12-25 DIAGNOSIS — I4891 Unspecified atrial fibrillation: Secondary | ICD-10-CM | POA: Diagnosis present

## 2023-12-25 DIAGNOSIS — F17210 Nicotine dependence, cigarettes, uncomplicated: Secondary | ICD-10-CM | POA: Diagnosis present

## 2023-12-25 LAB — BASIC METABOLIC PANEL
Anion gap: 10 (ref 5–15)
BUN: 28 mg/dL — ABNORMAL HIGH (ref 8–23)
CO2: 27 mmol/L (ref 22–32)
Calcium: 8.4 mg/dL — ABNORMAL LOW (ref 8.9–10.3)
Chloride: 103 mmol/L (ref 98–111)
Creatinine, Ser: 0.87 mg/dL (ref 0.61–1.24)
GFR, Estimated: >60 mL/min (ref >60)
Glucose, Bld: 146 mg/dL — ABNORMAL HIGH (ref 70–99)
Potassium: 4.1 mmol/L (ref 3.5–5.1)
Sodium: 140 mmol/L (ref 135–145)

## 2023-12-25 LAB — TROPONIN I (HIGH SENSITIVITY): Troponin I (High Sensitivity): 9 ng/L (ref <18)

## 2023-12-25 LAB — CBC
HCT: 53.5 % — ABNORMAL HIGH (ref 39.0–52.0)
Hemoglobin: 17.3 g/dL — ABNORMAL HIGH (ref 13.0–17.0)
MCH: 31.1 pg (ref 26.0–34.0)
MCHC: 32.3 g/dL (ref 30.0–36.0)
MCV: 96.1 fL (ref 80.0–100.0)
Platelets: 199 10*3/uL (ref 150–400)
RBC: 5.57 MIL/uL (ref 4.22–5.81)
RDW: 14.5 % (ref 11.5–15.5)
WBC: 8.4 10*3/uL (ref 4.0–10.5)
nRBC: 0 % (ref 0.0–0.2)

## 2023-12-25 MED ORDER — MORPHINE SULFATE (PF) 4 MG/ML IV SOLN
4.0000 mg | Freq: Once | INTRAVENOUS | Status: AC
  Administered 2023-12-25: 4 mg via INTRAVENOUS
  Filled 2023-12-25: qty 1

## 2023-12-25 MED ORDER — ASPIRIN 81 MG PO CHEW
324.0000 mg | CHEWABLE_TABLET | Freq: Once | ORAL | Status: AC
  Administered 2023-12-25: 324 mg via ORAL
  Filled 2023-12-25: qty 4

## 2023-12-25 MED ORDER — METHYLPREDNISOLONE SODIUM SUCC 125 MG IJ SOLR
125.0000 mg | Freq: Once | INTRAMUSCULAR | Status: AC
  Administered 2023-12-25: 125 mg via INTRAVENOUS
  Filled 2023-12-25: qty 2

## 2023-12-25 MED ORDER — ONDANSETRON HCL 4 MG/2ML IJ SOLN
4.0000 mg | Freq: Once | INTRAMUSCULAR | Status: AC
  Administered 2023-12-25: 4 mg via INTRAVENOUS
  Filled 2023-12-25: qty 2

## 2023-12-25 MED ORDER — DILTIAZEM HCL 25 MG/5ML IV SOLN
10.0000 mg | Freq: Once | INTRAVENOUS | Status: AC
  Administered 2023-12-25: 10 mg via INTRAVENOUS
  Filled 2023-12-25: qty 5

## 2023-12-25 MED ORDER — SODIUM CHLORIDE 0.9 % IV BOLUS
500.0000 mL | Freq: Once | INTRAVENOUS | Status: AC
  Administered 2023-12-25: 500 mL via INTRAVENOUS

## 2023-12-25 MED ORDER — IPRATROPIUM-ALBUTEROL 0.5-2.5 (3) MG/3ML IN SOLN
3.0000 mL | Freq: Once | RESPIRATORY_TRACT | Status: AC
  Administered 2023-12-25: 3 mL via RESPIRATORY_TRACT
  Filled 2023-12-25: qty 3

## 2023-12-25 NOTE — ED Provider Notes (Signed)
[facility] Provider Note    Event Date/Time   First MD Initiated Contact with Patient 12/25/23 2259     (approximate)   History   Chest Pain   HPI  Kenneth Benson is a 63 y.o. male brought to the ED via EMS from home with a chief complaint of chest pain.  EMS gave nitroglycerin and patient became hypotensive; given IV fluids prior to arrival.  History of atrial fibrillation on Eliquis, COPD/CHF on 2 L nasal cannula oxygen at baseline.  Hospitalized last week for COPD, he was found to be RSV+.  Reports chest pain tonight, left-sided, associated with shortness of breath.  Nonradiating, not associated with diaphoresis, nausea/vomiting or dizziness.  Found to be in atrial fibrillation with rapid ventricular rate of 125's.     Past Medical History   Past Medical History:  Diagnosis Date   A-fib (HCC) 02/06/2022   AKI (acute kidney injury) (HCC) 12/17/2022   Arrhythmia    atrial fibrillation   Asthma    CHF (congestive heart failure) (HCC)    COPD (chronic obstructive pulmonary disease) (HCC)    Hyperkalemia 12/17/2022   Hypokalemia 04/13/2023   Myocardial injury 04/13/2023   Tobacco abuse      Active Problem List   Patient Active Problem List   Diagnosis Date Noted   Morbid obesity (HCC) 12/21/2023   Paroxysmal atrial fibrillation (HCC) 12/21/2023   Financial difficulties 12/21/2023   Musculoskeletal chest pain 12/21/2023   COPD exacerbation (HCC) 08/23/2023   Chronic hypoxic respiratory failure (HCC) 08/22/2023   SOB (shortness of breath) 07/02/2023   HTN (hypertension) 04/13/2023   Lung nodule 04/13/2023   Obesity (BMI 30-39.9) 04/13/2023   Asthmatic bronchitis , chronic (HCC) 02/12/2023   Moderate tricuspid regurgitation 12/21/2022   Paroxysmal atrial fibrillation with RVR (HCC) 12/16/2022   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   Home Medications   Prior to Admission medications   Medication Sig Start Date End Date Taking? Authorizing Provider  albuterol (PROVENTIL) (2.5 MG/3ML) 0.083% nebulizer solution Take 3 mLs (2.5 mg total) by nebulization every 6 (six) hours as needed for wheezing or shortness of breath. 07/10/23 12/21/23  Enedina Finner, MD  albuterol (VENTOLIN HFA) 108 (90 Base) MCG/ACT inhaler Inhale 2 puffs into the lungs every 6 (six) hours as needed for wheezing or shortness of breath. 07/10/23   Enedina Finner, MD  apixaban (ELIQUIS) 5 MG TABS tablet Take 1 tablet (5 mg total) by mouth 2 (two) times daily. 07/10/23   Enedina Finner, MD  arformoterol (BROVANA) 15 MCG/2ML NEBU Take 2 mLs (15 mcg total) by nebulization 2 (two) times daily. 07/10/23 12/21/23  Enedina Finner, MD  digoxin (LANOXIN) 0.125 MG tablet Take 1 tablet (0.125 mg total) by mouth daily. 09/02/23 12/21/23  Charise Killian, MD  diltiazem (CARDIZEM CD) 180 MG 24 hr capsule Take 1 capsule (180 mg total) by mouth daily. 09/02/23 12/21/23  Charise Killian, MD  JARDIANCE 10 MG TABS tablet Take 1 tablet (10 mg total) by mouth daily. 07/10/23   Enedina Finner, MD  metoprolol tartrate (LOPRESSOR) 50 MG tablet Take 1 tablet (50 mg total) by mouth 2 (two) times daily. 09/01/23 12/21/23  Charise Killian, MD  predniSONE (DELTASONE) 20 MG tablet Take 2 tablets (40 mg total) by mouth daily with breakfast for 3 days, THEN 1 tablet (20 mg total) daily with breakfast for 3 days. 12/24/23 12/30/23  Marrion Coy, MD  spironolactone (ALDACTONE) 25 MG tablet Take 0.5 tablets (12.5 mg total) by mouth daily. Hold this medication until you see your cardiologist 09/01/23   Charise Killian, MD  torsemide (DEMADEX) 20 MG tablet Take 2 tablets (40 mg total) by mouth daily. Hold this medication until you see  your cardiologist 09/01/23 12/21/23  Charise Killian, MD  amiodarone (PACERONE) 200 MG tablet Take 1 tablet (200 mg total) by mouth 2 (two) times daily. 02/11/22 03/15/22  Lewie Chamber, MD     Allergies  Patient has no known allergies.   Family History   Family History  Problem Relation Age of Onset   Heart disease Mother    Atrial fibrillation Father      Physical Exam  Triage Vital Signs: ED Triage Vitals [12/25/23 2307]  Encounter Vitals Group     BP (!) 126/97     Systolic BP Percentile      Diastolic BP Percentile      Pulse Rate (!) 124     Resp 19     Temp 98.4 F (36.9 C)     Temp Source Oral     SpO2 98 %     Weight      Height      Head Circumference      Peak Flow      Pain Score      Pain Loc      Pain Education      Exclude from Growth Chart     Updated Vital Signs: BP (!) 126/97   Pulse (!) 124   Temp 98.4 F (36.9 C) (Oral)   Resp 19   SpO2 98%    General: Awake, moderate distress.  CV:  Tachycardic, irregularly irregular rhythm.  Good peripheral perfusion.  Resp:  Normal effort.  Diminished, otherwise CTAB. Abd:  Nontender.  No distention.  Other:  No thyromegaly.  No pedal edema.   ED Results / Procedures / Treatments  Labs (all labs ordered are listed, but only abnormal results are displayed) Labs Reviewed  BASIC METABOLIC PANEL - Abnormal; Notable for the following components:      Result Value   Glucose, Bld 146 (*)    BUN 28 (*)    Calcium 8.4 (*)    All other components within normal limits  CBC - Abnormal; Notable for the following components:   Hemoglobin 17.3 (*)    HCT 53.5 (*)    All other components within normal limits  DIGOXIN LEVEL - Abnormal; Notable for the following components:   Digoxin Level 0.5 (*)    All other components within normal limits  URINALYSIS, ROUTINE W REFLEX MICROSCOPIC - Abnormal; Notable for the following components:   Color, Urine YELLOW (*)    APPearance CLEAR (*)    Specific Gravity,  Urine 1.040 (*)    Glucose, UA >=500 (*)    All other components within normal limits  HEPATIC FUNCTION PANEL  LIPASE, BLOOD  TROPONIN I (HIGH SENSITIVITY)  TROPONIN I (HIGH SENSITIVITY)     EKG  ED ECG REPORT I, Zeki Bedrosian J, the attending physician, personally viewed and interpreted this ECG.   Date: 12/25/2023 after administration of 10 mg IV Cardizem  EKG Time: 2356  Rate: 97  Rhythm: atrial fibrillation, rate 97  Axis: Normal  Intervals:none  ST&T Change: Nonspecific    RADIOLOGY I have independently visualized and interpreted patient's imaging study as well as noted the radiology interpretation:  Chest x-ray: Chronic cardiomegaly, mild bibasilar atelectasis, small left pleural effusion  Official radiology report(s): CT Renal Stone Study Result Date: 12/26/2023 CLINICAL DATA:  Abdominal and flank pain. EXAM: CT ABDOMEN AND PELVIS WITHOUT CONTRAST TECHNIQUE: Multidetector CT imaging of the abdomen and pelvis was performed following the standard protocol without IV contrast. RADIATION DOSE REDUCTION: This exam was performed according to the departmental dose-optimization program which includes automated exposure control, adjustment of the mA and/or kV according to patient size and/or use of iterative reconstruction technique. COMPARISON:  CTA abdomen and pelvis 12/16/2022 FINDINGS: Lower chest: No acute abnormality. Hepatobiliary: Small gallstone is present. There is no biliary ductal dilatation. T fat along the falciform ligament he liver is within normal limits. There is likely focal fatty infiltration along the falciform ligament. Pancreas: Unremarkable. No pancreatic ductal dilatation or surrounding inflammatory changes. Spleen: Normal in size without focal abnormality. Adrenals/Urinary Tract: There is a cyst in the inferior pole the right kidney measuring 4.3 cm. Otherwise, the kidneys, adrenal glands and bladder are within normal limits. Stomach/Bowel: Stomach is within normal  limits. Appendix appears normal. No evidence of bowel wall thickening, distention, or inflammatory changes. Vascular/Lymphatic: No significant vascular findings are present. No enlarged abdominal or pelvic lymph nodes. Reproductive: Prostate is unremarkable. Other: There are large bilateral inguinal hernias. The left inguinal hernia contains nondilated bowel, unchanged. There is no ascites or free air. Musculoskeletal: Chronic compression deformity of L3 appears unchanged. Degenerative changes affect the hips. IMPRESSION: 1. No acute localizing process in the abdomen or pelvis. 2. Cholelithiasis. 3. Stable large bilateral inguinal hernias. The left inguinal hernia contains nondilated bowel. 4. Right Bosniak I benign renal cyst measuring 4.3 cm. No follow-up imaging is recommended. JACR 2018 Feb; 264-273, Management of the Incidental Renal Mass on CT, [employer] 2021; 814-848, Bosniak Classification of Cystic Renal Masses, Version 2019. Electronically Signed   By: Darliss Cheney M.D.   On: 12/26/2023 01:10   DG Chest Port 1 View Result Date: 12/25/2023 CLINICAL DATA:  Chest pain. EXAM: PORTABLE CHEST 1 VIEW COMPARISON:  Radiograph 4 days ago 12/21/2023, CT 07/02/2023 FINDINGS: Stable cardiomegaly. Unchanged mediastinal contours. There may be a trace left pleural effusion. Mild bibasilar atelectasis without confluent airspace disease. No pulmonary edema. No pneumothorax. Stable osseous structures. IMPRESSION: 1. Chronic cardiomegaly. 2. Mild bibasilar atelectasis with possible small left pleural effusion. Electronically Signed   By: Narda Rutherford M.D.   On: 12/25/2023 23:32     PROCEDURES:  Critical Care performed: Yes, see critical care procedure note(s)  CRITICAL CARE Performed by: Irean Hong   Total critical care time: 45 minutes  Critical care time was exclusive of separately billable procedures and treating other patients.  Critical care was necessary to treat or prevent imminent or  life-threatening deterioration.  Critical care was time spent personally by me on the following activities: development of treatment plan with patient and/or surrogate as well as nursing, discussions with consultants, evaluation of patient's response to treatment, examination of patient, obtaining history from patient or surrogate, ordering and performing treatments and interventions, ordering and review of laboratory studies, ordering and review of radiographic studies, pulse oximetry and re-evaluation of patient's condition.   Marland Kitchen1-3 Lead EKG Interpretation  Performed by: Irean Hong, MD Authorized by: Irean Hong, MD     Interpretation: abnormal     ECG  rate:  125   ECG rate assessment: tachycardic     Rhythm: atrial fibrillation     Ectopy: none     Conduction: normal   Comments:     Patient placed on cardiac monitor to evaluate for arrhythmias    MEDICATIONS ORDERED IN ED: Medications  sodium chloride 0.9 % bolus 500 mL (500 mLs Intravenous New Bag/Given 12/25/23 2348)  ipratropium-albuterol (DUONEB) 0.5-2.5 (3) MG/3ML nebulizer solution 3 mL (3 mLs Nebulization Given 12/25/23 2340)  methylPREDNISolone sodium succinate (SOLU-MEDROL) 125 mg/2 mL injection 125 mg (125 mg Intravenous Given 12/25/23 2339)  morphine (PF) 4 MG/ML injection 4 mg (4 mg Intravenous Given 12/25/23 2339)  ondansetron (ZOFRAN) injection 4 mg (4 mg Intravenous Given 12/25/23 2339)  aspirin chewable tablet 324 mg (324 mg Oral Given 12/25/23 2338)  diltiazem (CARDIZEM) injection 10 mg (10 mg Intravenous Given 12/25/23 2340)     IMPRESSION / MDM / ASSESSMENT AND PLAN / ED COURSE  I reviewed the triage vital signs and the nursing notes.                             63 year old male presenting with chest pain. Differential diagnosis includes, but is not limited to, ACS, aortic dissection, pulmonary embolism, cardiac tamponade, pneumothorax, pneumonia, pericarditis, myocarditis, GI-related causes including  esophagitis/gastritis, and musculoskeletal chest wall pain.   I personally reviewed patient's records and note his hospitalization 1/23 - 12/24/2023 for COPD exacerbation.  Patient's presentation is most consistent with acute presentation with potential threat to life or bodily function.  The patient is on the cardiac monitor to evaluate for evidence of arrhythmia and/or significant heart rate changes.  Will obtain cardiac panel, administer 125 mg IV Solu-Medrol, DuoNeb, 4 baby aspirin.  Administer IV Cardizem bolus for rate control.  Anticipate hospitalization.  Clinical Course as of 12/26/23 0122  Tue Dec 26, 2023  0119 CT renal stone study done for complaints of right flank pain.  Demonstrates cholelithiasis without evidence for cholecystitis.  Will check LFTs/lipase.  Will consult hospital services for evaluation and admission. [JS]    Clinical Course User Index [JS] Irean Hong, MD     FINAL CLINICAL IMPRESSION(S) / ED DIAGNOSES   Final diagnoses:  Chest pain, unspecified type  Atrial fibrillation with rapid ventricular response (HCC)  COPD with acute exacerbation (HCC)  Calculus of gallbladder without cholecystitis without obstruction     Rx / DC Orders   ED Discharge Orders     None        Note:  This document was prepared using Dragon voice recognition software and may include unintentional dictation errors.   Irean Hong, MD 12/26/23 [phone]

## 2023-12-25 NOTE — ED Provider Notes (Incomplete)
[facility] Provider Note    Event Date/Time   First MD Initiated Contact with Patient 12/25/23 2259     (approximate)   History   Chest Pain   HPI  Kenneth Benson is a 63 y.o. male brought to the ED via EMS from home with a chief complaint of chest pain.  EMS gave nitroglycerin and patient became hypotensive; given IV fluids prior to arrival.  History of atrial fibrillation on Eliquis, COPD/CHF on 2 L nasal cannula oxygen at baseline.  Hospitalized last week for COPD, he was found to be RSV+.  Reports chest pain tonight, left-sided, associated with shortness of breath.  Nonradiating, not associated with diaphoresis, nausea/vomiting or dizziness.  Found to be in atrial fibrillation with rapid ventricular rate of 125's.     Past Medical History   Past Medical History:  Diagnosis Date  . A-fib (HCC) 02/06/2022  . AKI (acute kidney injury) (HCC) 12/17/2022  . Arrhythmia    atrial fibrillation  . Asthma   . CHF (congestive heart failure) (HCC)   . COPD (chronic obstructive pulmonary disease) (HCC)   . Hyperkalemia 12/17/2022  . Hypokalemia 04/13/2023  . Myocardial injury 04/13/2023  . Tobacco abuse      Active Problem List   Patient Active Problem List   Diagnosis Date Noted  . Morbid obesity (HCC) 12/21/2023  . Paroxysmal atrial fibrillation (HCC) 12/21/2023  . Financial difficulties 12/21/2023  . Musculoskeletal chest pain 12/21/2023  . COPD exacerbation (HCC) 08/23/2023  . Chronic hypoxic respiratory failure (HCC) 08/22/2023  . SOB (shortness of breath) 07/02/2023  . HTN (hypertension) 04/13/2023  . Lung nodule 04/13/2023  . Obesity (BMI 30-39.9) 04/13/2023  . Asthmatic bronchitis , chronic (HCC) 02/12/2023  . Moderate tricuspid regurgitation 12/21/2022  . Paroxysmal atrial fibrillation with RVR (HCC) 12/16/2022  . 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 Physical deconditioning 02/07/2022  . Dysphagia  02/06/2022  . Asthma exacerbation 01/21/2022  . Cigarette smoker 01/21/2022  . Acute on chronic respiratory failure with hypoxia [facility])      Past Surgical History  No past surgical history on file.   Home Medications   Prior to Admission medications   Medication Sig Start Date End Date Taking? Authorizing Provider  albuterol (PROVENTIL) (2.5 MG/3ML) 0.083% nebulizer solution Take 3 mLs (2.5 mg total) by nebulization every 6 (six) hours as needed for wheezing or shortness of breath. 07/10/23 12/21/23  Enedina Finner, MD  albuterol (VENTOLIN HFA) 108 (90 Base) MCG/ACT inhaler Inhale 2 puffs into the lungs every 6 (six) hours as needed for wheezing or shortness of breath. 07/10/23   Enedina Finner, MD  apixaban (ELIQUIS) 5 MG TABS tablet Take 1 tablet (5 mg total) by mouth 2 (two) times daily. 07/10/23   Enedina Finner, MD  arformoterol (BROVANA) 15 MCG/2ML NEBU Take 2 mLs (15 mcg total) by nebulization 2 (two) times daily. 07/10/23 12/21/23  Enedina Finner, MD  digoxin (LANOXIN) 0.125 MG tablet Take 1 tablet (0.125 mg total) by mouth daily. 09/02/23 12/21/23  Charise Killian, MD  diltiazem (CARDIZEM CD) 180 MG 24 hr capsule Take 1 capsule (180 mg total) by mouth daily. 09/02/23 12/21/23  Charise Killian, MD  JARDIANCE 10 MG TABS tablet Take 1 tablet (10 mg total) by mouth daily. 07/10/23   Enedina Finner, MD  metoprolol tartrate (LOPRESSOR) 50 MG tablet Take 1 tablet (50 mg total) by mouth 2 (two) times daily. 09/01/23 12/21/23  Charise Killian, MD  predniSONE (DELTASONE) 20 MG tablet Take 2 tablets (40 mg total) by mouth daily with breakfast for 3 days, THEN 1 tablet (20 mg total) daily with breakfast for 3 days. 12/24/23 12/30/23  Marrion Coy, MD  spironolactone (ALDACTONE) 25 MG tablet Take 0.5 tablets (12.5 mg total) by mouth daily. Hold this medication until you see your cardiologist 09/01/23   Charise Killian, MD  torsemide (DEMADEX) 20 MG tablet Take 2 tablets (40 mg total) by mouth daily. Hold this  medication until you see your cardiologist 09/01/23 12/21/23  Charise Killian, MD  amiodarone (PACERONE) 200 MG tablet Take 1 tablet (200 mg total) by mouth 2 (two) times daily. 02/11/22 03/15/22  Lewie Chamber, MD     Allergies  Patient has no known allergies.   Family History   Family History  Problem Relation Age of Onset  . Heart disease Mother   . Atrial fibrillation Father      Physical Exam  Triage Vital Signs: ED Triage Vitals [12/25/23 2307]  Encounter Vitals Group     BP (!) 126/97     Systolic BP Percentile      Diastolic BP Percentile      Pulse Rate (!) 124     Resp 19     Temp 98.4 F (36.9 C)     Temp Source Oral     SpO2 98 %     Weight      Height      Head Circumference      Peak Flow      Pain Score      Pain Loc      Pain Education      Exclude from Growth Chart     Updated Vital Signs: BP (!) 126/97   Pulse (!) 124   Temp 98.4 F (36.9 C) (Oral)   Resp 19   SpO2 98%    General: Awake, moderate distress.  CV:  Tachycardic, irregularly irregular rhythm.  Good peripheral perfusion.  Resp:  Normal effort.  Diminished, otherwise CTAB. Abd:  Nontender.  No distention.  Other:  No thyromegaly.  No pedal edema.   ED Results / Procedures / Treatments  Labs (all labs ordered are listed, but only abnormal results are displayed) Labs Reviewed  BASIC METABOLIC PANEL - Abnormal; Notable for the following components:      Result Value   Glucose, Bld 146 (*)    BUN 28 (*)    Calcium 8.4 (*)    All other components within normal limits  CBC - Abnormal; Notable for the following components:   Hemoglobin 17.3 (*)    HCT 53.5 (*)    All other components within normal limits  DIGOXIN LEVEL  TROPONIN I (HIGH SENSITIVITY)     EKG  ED ECG REPORT I, Arien Benincasa J, the attending physician, personally viewed and interpreted this ECG.   Date: 12/25/2023 after administration of 10 mg IV Cardizem  EKG Time: 2356  Rate: 97  Rhythm: atrial  fibrillation, rate 97  Axis: Normal  Intervals:none  ST&T Change: Nonspecific    RADIOLOGY I have independently visualized and interpreted patient's imaging study as well as noted the radiology interpretation:  Chest x-ray: Chronic cardiomegaly, mild bibasilar atelectasis, small left pleural effusion  Official radiology report(s): DG Chest Port 1 View Result Date: 12/25/2023 CLINICAL DATA:  Chest pain. EXAM: PORTABLE CHEST 1 VIEW COMPARISON:  Radiograph 4 days ago 12/21/2023, CT 07/02/2023 FINDINGS: Stable cardiomegaly. Unchanged mediastinal contours. There may be a trace  left pleural effusion. Mild bibasilar atelectasis without confluent airspace disease. No pulmonary edema. No pneumothorax. Stable osseous structures. IMPRESSION: 1. Chronic cardiomegaly. 2. Mild bibasilar atelectasis with possible small left pleural effusion. Electronically Signed   By: Narda Rutherford M.D.   On: 12/25/2023 23:32     PROCEDURES:  Critical Care performed: Yes, see critical care procedure note(s)  CRITICAL CARE Performed by: Irean Hong   Total critical care time: *** minutes  Critical care time was exclusive of separately billable procedures and treating other patients.  Critical care was necessary to treat or prevent imminent or life-threatening deterioration.  Critical care was time spent personally by me on the following activities: development of treatment plan with patient and/or surrogate as well as nursing, discussions with consultants, evaluation of patient's response to treatment, examination of patient, obtaining history from patient or surrogate, ordering and performing treatments and interventions, ordering and review of laboratory studies, ordering and review of radiographic studies, pulse oximetry and re-evaluation of patient's condition.   Marland Kitchen1-3 Lead EKG Interpretation  Performed by: Irean Hong, MD Authorized by: Irean Hong, MD     Interpretation: abnormal     ECG rate:   125   ECG rate assessment: tachycardic     Rhythm: atrial fibrillation     Ectopy: none     Conduction: normal   Comments:     Patient placed on cardiac monitor to evaluate for arrhythmias    MEDICATIONS ORDERED IN ED: Medications  sodium chloride 0.9 % bolus 500 mL (500 mLs Intravenous New Bag/Given 12/25/23 2348)  ipratropium-albuterol (DUONEB) 0.5-2.5 (3) MG/3ML nebulizer solution 3 mL (3 mLs Nebulization Given 12/25/23 2340)  methylPREDNISolone sodium succinate (SOLU-MEDROL) 125 mg/2 mL injection 125 mg (125 mg Intravenous Given 12/25/23 2339)  morphine (PF) 4 MG/ML injection 4 mg (4 mg Intravenous Given 12/25/23 2339)  ondansetron (ZOFRAN) injection 4 mg (4 mg Intravenous Given 12/25/23 2339)  aspirin chewable tablet 324 mg (324 mg Oral Given 12/25/23 2338)  diltiazem (CARDIZEM) injection 10 mg (10 mg Intravenous Given 12/25/23 2340)     IMPRESSION / MDM / ASSESSMENT AND PLAN / ED COURSE  I reviewed the triage vital signs and the nursing notes.                             63 year old male presenting with chest pain. Differential diagnosis includes, but is not limited to, ACS, aortic dissection, pulmonary embolism, cardiac tamponade, pneumothorax, pneumonia, pericarditis, myocarditis, GI-related causes including esophagitis/gastritis, and musculoskeletal chest wall pain.   I personally reviewed patient's records and note his hospitalization 1/23 - 12/24/2023 for COPD exacerbation.  Patient's presentation is most consistent with acute presentation with potential threat to life or bodily function.  The patient is on the cardiac monitor to evaluate for evidence of arrhythmia and/or significant heart rate changes.  Will obtain cardiac panel, administer 125 mg IV Solu-Medrol, DuoNeb, 4 baby aspirin.  Administer IV Cardizem bolus for rate control.  Anticipate hospitalization.      FINAL CLINICAL IMPRESSION(S) / ED DIAGNOSES   Final diagnoses:  Chest pain, unspecified type  Atrial  fibrillation with rapid ventricular response (HCC)  COPD with acute exacerbation (HCC)     Rx / DC Orders   ED Discharge Orders     None        Note:  This document was prepared using Dragon voice recognition software and may include unintentional dictation errors.

## 2023-12-25 NOTE — ED Triage Notes (Signed)
BIBA Pt dx with RSV last week. Pt c/o central CP. Pt BP upon ems arrival, BP 180/100, ems gave nitroglycerin and pt's BP dropped to 90/40. Pt given 500 fluid bolus PTA.

## 2023-12-26 ENCOUNTER — Emergency Department: Payer: Medicaid Other

## 2023-12-26 ENCOUNTER — Encounter: Payer: Self-pay | Admitting: Family Medicine

## 2023-12-26 DIAGNOSIS — I110 Hypertensive heart disease with heart failure: Secondary | ICD-10-CM | POA: Diagnosis present

## 2023-12-26 DIAGNOSIS — J90 Pleural effusion, not elsewhere classified: Secondary | ICD-10-CM | POA: Diagnosis present

## 2023-12-26 DIAGNOSIS — K8020 Calculus of gallbladder without cholecystitis without obstruction: Secondary | ICD-10-CM | POA: Diagnosis present

## 2023-12-26 DIAGNOSIS — K4020 Bilateral inguinal hernia, without obstruction or gangrene, not specified as recurrent: Secondary | ICD-10-CM | POA: Diagnosis present

## 2023-12-26 DIAGNOSIS — I4891 Unspecified atrial fibrillation: Secondary | ICD-10-CM | POA: Diagnosis not present

## 2023-12-26 DIAGNOSIS — J9601 Acute respiratory failure with hypoxia: Secondary | ICD-10-CM | POA: Diagnosis present

## 2023-12-26 DIAGNOSIS — J9811 Atelectasis: Secondary | ICD-10-CM | POA: Diagnosis present

## 2023-12-26 DIAGNOSIS — R079 Chest pain, unspecified: Secondary | ICD-10-CM | POA: Diagnosis present

## 2023-12-26 DIAGNOSIS — F17210 Nicotine dependence, cigarettes, uncomplicated: Secondary | ICD-10-CM | POA: Diagnosis present

## 2023-12-26 DIAGNOSIS — Z79899 Other long term (current) drug therapy: Secondary | ICD-10-CM | POA: Diagnosis not present

## 2023-12-26 DIAGNOSIS — Z7901 Long term (current) use of anticoagulants: Secondary | ICD-10-CM | POA: Diagnosis not present

## 2023-12-26 DIAGNOSIS — Z7982 Long term (current) use of aspirin: Secondary | ICD-10-CM | POA: Diagnosis not present

## 2023-12-26 DIAGNOSIS — Z8249 Family history of ischemic heart disease and other diseases of the circulatory system: Secondary | ICD-10-CM | POA: Diagnosis not present

## 2023-12-26 DIAGNOSIS — E875 Hyperkalemia: Secondary | ICD-10-CM | POA: Diagnosis present

## 2023-12-26 DIAGNOSIS — Z6837 Body mass index (BMI) 37.0-37.9, adult: Secondary | ICD-10-CM | POA: Diagnosis not present

## 2023-12-26 DIAGNOSIS — I2510 Atherosclerotic heart disease of native coronary artery without angina pectoris: Secondary | ICD-10-CM | POA: Diagnosis present

## 2023-12-26 DIAGNOSIS — I5042 Chronic combined systolic (congestive) and diastolic (congestive) heart failure: Secondary | ICD-10-CM | POA: Diagnosis present

## 2023-12-26 DIAGNOSIS — N281 Cyst of kidney, acquired: Secondary | ICD-10-CM | POA: Diagnosis present

## 2023-12-26 DIAGNOSIS — I480 Paroxysmal atrial fibrillation: Secondary | ICD-10-CM

## 2023-12-26 DIAGNOSIS — Z7984 Long term (current) use of oral hypoglycemic drugs: Secondary | ICD-10-CM | POA: Diagnosis not present

## 2023-12-26 DIAGNOSIS — I5022 Chronic systolic (congestive) heart failure: Secondary | ICD-10-CM

## 2023-12-26 DIAGNOSIS — M4856XA Collapsed vertebra, not elsewhere classified, lumbar region, initial encounter for fracture: Secondary | ICD-10-CM | POA: Diagnosis present

## 2023-12-26 DIAGNOSIS — J441 Chronic obstructive pulmonary disease with (acute) exacerbation: Principal | ICD-10-CM

## 2023-12-26 LAB — BASIC METABOLIC PANEL
Anion gap: 6 (ref 5–15)
BUN: 27 mg/dL — ABNORMAL HIGH (ref 8–23)
CO2: 31 mmol/L (ref 22–32)
Calcium: 7.8 mg/dL — ABNORMAL LOW (ref 8.9–10.3)
Chloride: 104 mmol/L (ref 98–111)
Creatinine, Ser: 0.85 mg/dL (ref 0.61–1.24)
GFR, Estimated: >60 mL/min (ref >60)
Glucose, Bld: 151 mg/dL — ABNORMAL HIGH (ref 70–99)
Potassium: 5.4 mmol/L — ABNORMAL HIGH (ref 3.5–5.1)
Sodium: 141 mmol/L (ref 135–145)

## 2023-12-26 LAB — HEPATIC FUNCTION PANEL
ALT: 141 U/L — ABNORMAL HIGH (ref 0–44)
AST: 56 U/L — ABNORMAL HIGH (ref 15–41)
Albumin: 3.5 g/dL (ref 3.5–5.0)
Alkaline Phosphatase: 55 U/L (ref 38–126)
Bilirubin, Direct: 0.1 mg/dL (ref 0.0–0.2)
Indirect Bilirubin: 0.5 mg/dL (ref 0.3–0.9)
Total Bilirubin: 0.6 mg/dL (ref 0.0–1.2)
Total Protein: 6.9 g/dL (ref 6.5–8.1)

## 2023-12-26 LAB — CULTURE, BLOOD (ROUTINE X 2)
Culture: NO GROWTH
Culture: NO GROWTH
Special Requests: ADEQUATE
Special Requests: ADEQUATE

## 2023-12-26 LAB — URINALYSIS, ROUTINE W REFLEX MICROSCOPIC
Bacteria, UA: NONE SEEN
Bilirubin Urine: NEGATIVE
Glucose, UA: >=500 mg/dL — AB
Hgb urine dipstick: NEGATIVE
Ketones, ur: NEGATIVE mg/dL
Leukocytes,Ua: NEGATIVE
Nitrite: NEGATIVE
Protein, ur: NEGATIVE mg/dL
Specific Gravity, Urine: 1.04 — ABNORMAL HIGH (ref 1.005–1.030)
pH: 5 (ref 5.0–8.0)

## 2023-12-26 LAB — CBC
HCT: 52.7 % — ABNORMAL HIGH (ref 39.0–52.0)
Hemoglobin: 16.6 g/dL (ref 13.0–17.0)
MCH: 30.9 pg (ref 26.0–34.0)
MCHC: 31.5 g/dL (ref 30.0–36.0)
MCV: 98.1 fL (ref 80.0–100.0)
Platelets: 183 10*3/uL (ref 150–400)
RBC: 5.37 MIL/uL (ref 4.22–5.81)
RDW: 14.4 % (ref 11.5–15.5)
WBC: 6.4 10*3/uL (ref 4.0–10.5)
nRBC: 0 % (ref 0.0–0.2)

## 2023-12-26 LAB — DIGOXIN LEVEL: Digoxin Level: 0.5 ng/mL — ABNORMAL LOW (ref 0.8–2.0)

## 2023-12-26 LAB — TROPONIN I (HIGH SENSITIVITY): Troponin I (High Sensitivity): 9 ng/L (ref <18)

## 2023-12-26 LAB — LIPASE, BLOOD: Lipase: 31 U/L (ref 11–51)

## 2023-12-26 MED ORDER — AMIODARONE HCL 200 MG PO TABS
400.0000 mg | ORAL_TABLET | Freq: Two times a day (BID) | ORAL | Status: DC
  Administered 2023-12-26 – 2024-01-01 (×13): 400 mg via ORAL
  Filled 2023-12-26 (×13): qty 2

## 2023-12-26 MED ORDER — TORSEMIDE 20 MG PO TABS
40.0000 mg | ORAL_TABLET | Freq: Every day | ORAL | Status: DC
  Administered 2023-12-26 – 2024-01-01 (×7): 40 mg via ORAL
  Filled 2023-12-26 (×7): qty 2

## 2023-12-26 MED ORDER — SODIUM ZIRCONIUM CYCLOSILICATE 10 G PO PACK
10.0000 g | PACK | Freq: Once | ORAL | Status: AC
  Administered 2023-12-26: 10 g via ORAL
  Filled 2023-12-26: qty 1

## 2023-12-26 MED ORDER — ONDANSETRON HCL 4 MG/2ML IJ SOLN: 4.0000 mg | Freq: Four times a day (QID) | INTRAMUSCULAR | Status: DC | PRN

## 2023-12-26 MED ORDER — METHYLPREDNISOLONE SODIUM SUCC 125 MG IJ SOLR
80.0000 mg | INTRAMUSCULAR | Status: DC
  Administered 2023-12-26 – 2023-12-27 (×2): 80 mg via INTRAVENOUS
  Filled 2023-12-26 (×2): qty 2

## 2023-12-26 MED ORDER — GUAIFENESIN ER 600 MG PO TB12
600.0000 mg | ORAL_TABLET | Freq: Two times a day (BID) | ORAL | Status: DC
  Administered 2023-12-26 – 2024-01-01 (×13): 600 mg via ORAL
  Filled 2023-12-26 (×13): qty 1

## 2023-12-26 MED ORDER — HYDROCOD POLI-CHLORPHE POLI ER 10-8 MG/5ML PO SUER
5.0000 mL | Freq: Two times a day (BID) | ORAL | Status: DC | PRN
  Administered 2023-12-29: 5 mL via ORAL
  Filled 2023-12-26: qty 5

## 2023-12-26 MED ORDER — DIGOXIN 125 MCG PO TABS
0.1250 mg | ORAL_TABLET | Freq: Every day | ORAL | Status: DC
  Administered 2023-12-26 – 2024-01-01 (×7): 0.125 mg via ORAL
  Filled 2023-12-26 (×7): qty 1

## 2023-12-26 MED ORDER — ONDANSETRON HCL 4 MG PO TABS: 4.0000 mg | ORAL_TABLET | Freq: Four times a day (QID) | ORAL | Status: DC | PRN

## 2023-12-26 MED ORDER — ACETAMINOPHEN 650 MG RE SUPP: 650.0000 mg | Freq: Four times a day (QID) | RECTAL | Status: DC | PRN

## 2023-12-26 MED ORDER — SODIUM CHLORIDE 0.9 % IV SOLN: INTRAVENOUS | Status: AC

## 2023-12-26 MED ORDER — SPIRONOLACTONE 12.5 MG HALF TABLET
12.5000 mg | ORAL_TABLET | Freq: Every day | ORAL | Status: DC
  Administered 2023-12-26 – 2024-01-01 (×7): 12.5 mg via ORAL
  Filled 2023-12-26 (×7): qty 1

## 2023-12-26 MED ORDER — SODIUM CHLORIDE 0.9 % IV SOLN
1.0000 g | INTRAVENOUS | Status: DC
  Administered 2023-12-26: 1 g via INTRAVENOUS
  Filled 2023-12-26: qty 10

## 2023-12-26 MED ORDER — MAGNESIUM HYDROXIDE 400 MG/5ML PO SUSP: 30.0000 mL | Freq: Every day | ORAL | Status: DC | PRN

## 2023-12-26 MED ORDER — APIXABAN 5 MG PO TABS
5.0000 mg | ORAL_TABLET | Freq: Two times a day (BID) | ORAL | Status: DC
Start: 2023-12-26 — End: 2024-01-01
  Administered 2023-12-26 – 2024-01-01 (×13): 5 mg via ORAL
  Filled 2023-12-26 (×13): qty 1

## 2023-12-26 MED ORDER — GUAIFENESIN ER 600 MG PO TB12: 600.0000 mg | ORAL_TABLET | Freq: Two times a day (BID) | ORAL | Status: DC

## 2023-12-26 MED ORDER — MORPHINE SULFATE (PF) 2 MG/ML IV SOLN: 2.0000 mg | INTRAVENOUS | Status: DC | PRN

## 2023-12-26 MED ORDER — NITROGLYCERIN 0.4 MG SL SUBL: 0.4000 mg | SUBLINGUAL_TABLET | SUBLINGUAL | Status: DC | PRN

## 2023-12-26 MED ORDER — IPRATROPIUM-ALBUTEROL 0.5-2.5 (3) MG/3ML IN SOLN
3.0000 mL | Freq: Four times a day (QID) | RESPIRATORY_TRACT | Status: DC
  Administered 2023-12-26 – 2023-12-29 (×13): 3 mL via RESPIRATORY_TRACT
  Filled 2023-12-26 (×13): qty 3

## 2023-12-26 MED ORDER — ACETAMINOPHEN 325 MG PO TABS: 650.0000 mg | ORAL_TABLET | Freq: Four times a day (QID) | ORAL | Status: DC | PRN

## 2023-12-26 MED ORDER — EMPAGLIFLOZIN 10 MG PO TABS
10.0000 mg | ORAL_TABLET | Freq: Every day | ORAL | Status: DC
  Administered 2023-12-26 – 2024-01-01 (×7): 10 mg via ORAL
  Filled 2023-12-26 (×7): qty 1

## 2023-12-26 MED ORDER — DILTIAZEM HCL ER COATED BEADS 180 MG PO CP24
180.0000 mg | ORAL_CAPSULE | Freq: Every day | ORAL | Status: DC
  Administered 2023-12-26 – 2024-01-01 (×7): 180 mg via ORAL
  Filled 2023-12-26 (×7): qty 1

## 2023-12-26 MED ORDER — TRAZODONE HCL 50 MG PO TABS: 25.0000 mg | ORAL_TABLET | Freq: Every evening | ORAL | Status: DC | PRN

## 2023-12-26 MED ORDER — GUAIFENESIN ER 600 MG PO TB12
600.0000 mg | ORAL_TABLET | Freq: Two times a day (BID) | ORAL | Status: DC
  Filled 2023-12-26 (×2): qty 1

## 2023-12-26 MED ORDER — METOPROLOL TARTRATE 50 MG PO TABS
50.0000 mg | ORAL_TABLET | Freq: Two times a day (BID) | ORAL | Status: DC
  Administered 2023-12-26 – 2024-01-01 (×12): 50 mg via ORAL
  Filled 2023-12-26 (×13): qty 1

## 2023-12-26 MED ORDER — ASPIRIN 81 MG PO TBEC
81.0000 mg | DELAYED_RELEASE_TABLET | Freq: Every day | ORAL | Status: DC
  Administered 2023-12-26 – 2024-01-01 (×7): 81 mg via ORAL
  Filled 2023-12-26 (×7): qty 1

## 2023-12-26 MED ORDER — METHYLPREDNISOLONE SODIUM SUCC 40 MG IJ SOLR: 40.0000 mg | Freq: Two times a day (BID) | INTRAMUSCULAR | Status: DC

## 2023-12-26 NOTE — Assessment & Plan Note (Addendum)
-   We will place the patient IV steroid therapy with IV Solu-Medrol as well as nebulized bronchodilator therapy with duonebs q.i.d. and q.4 hours p.r.n.Marland Kitchen - Mucolytic therapy will be provided with Mucinex and antibiotic therapy with IV Rocephin. - O2 protocol will be followed. -We will avoid long-acting beta agonist.

## 2023-12-26 NOTE — TOC Initial Note (Signed)
Transition of Care [facility]) - Initial/Assessment Note    Patient Details  Name: Kenneth Benson MRN: [id number] Date of Birth: 1960/12/12  Transition of Care [facility]) CM/SW Contact:    Margarito Liner, LCSW Phone Number: 12/26/2023, 3:38 PM  Clinical Narrative:   This CSW completed the readmission prevention screen on 1/24:  "Readmission prevention screen complete. CSW met with patient. No supports at bedside. CSW introduced role and explained that discharge planning would be discussed. Patient does not have a PCP. He stated a barrier is not having an ID. CSW asked if he has gone to the [facility] to request one but he said they need several items showing proof of identification. TOC discussed this with him in September. CSW encouraged him to reach out to his Medicaid social worker for assistance with getting a PCP. [facility]acy is [facility] [address]. He reports difficulty affording the $4 copays. Patient does not have any income. Patient lives with his girlfriend and friend. Patient was recently incarcerated for 90 days. When he came home earlier this month, he had no power or water. He stated the bill is around $800 and he is also supposed to move out of his home on 1/31. No home health prior to admission. Patient has oxygen, a bipap, and a nebulizer machine through [employer]. CSW is waiting on a call back from [facility] director to discuss power/bill concerns since patient requires oxygen. No further concerns. CSW will continue to follow patient for support and facilitate return home when stable. He will likely need a cab at discharge.           5:00 pm: Discussed case with TOC director. Unable to provide assistance since patient has back bills that have not been paid and no plan to pay future bills."               Expected Discharge Plan: Home/Self Care Barriers to Discharge: Continued Medical Work up   Patient Goals and CMS Choice            Expected Discharge Plan and Services     Post Acute Care  Choice: NA Living arrangements for the past 2 months: Single Family Home                                      Prior Living Arrangements/Services Living arrangements for the past 2 months: Single Family Home Lives with:: Friends, Significant Other Patient language and need for interpreter reviewed:: Yes Do you feel safe going back to the place where you live?: Yes      Need for Family Participation in Patient Care: Yes (Comment) Care giver support system in place?: Yes (comment) Current home services: DME Criminal Activity/Legal Involvement Pertinent to Current Situation/Hospitalization: No - Comment as needed  Activities of Daily Living   ADL Screening (condition at time of admission) Independently performs ADLs?: No Does the patient have a NEW difficulty with bathing/dressing/toileting/self-feeding that is expected to last >3 days?: No Does the patient have a NEW difficulty with getting in/out of bed, walking, or climbing stairs that is expected to last >3 days?: No Does the patient have a NEW difficulty with communication that is expected to last >3 days?: No Is the patient deaf or have difficulty hearing?: No Does the patient have difficulty seeing, even when wearing glasses/contacts?: No Does the patient have difficulty concentrating, remembering, or making decisions?: No  Permission Sought/Granted  Emotional Assessment         Alcohol / Substance Use: Not Applicable Psych Involvement: No (comment)  Admission diagnosis:  Chest pain [R07.9] Atrial fibrillation with RVR (HCC) [I48.91] Patient Active Problem List   Diagnosis Date Noted   Chest pain 12/26/2023   COPD with acute exacerbation (HCC) 12/26/2023   Atrial fibrillation with RVR (HCC) 12/26/2023   Morbid obesity (HCC) 12/21/2023   Paroxysmal atrial fibrillation (HCC) 12/21/2023   Financial difficulties 12/21/2023   Musculoskeletal chest pain 12/21/2023   COPD exacerbation (HCC)  08/23/2023   Chronic hypoxic respiratory failure (HCC) 08/22/2023   SOB (shortness of breath) 07/02/2023   HTN (hypertension) 04/13/2023   Lung nodule 04/13/2023   Obesity (BMI 30-39.9) 04/13/2023   Asthmatic bronchitis , chronic (HCC) 02/12/2023   Moderate tricuspid regurgitation 12/21/2022   Paroxysmal atrial fibrillation with RVR (HCC) 12/16/2022   Chronic combined systolic and diastolic heart failure (HCC) 12/16/2022   Physical deconditioning 02/07/2022   Dysphagia 02/06/2022   Asthma exacerbation 01/21/2022   Cigarette smoker 01/21/2022   Acute on chronic respiratory failure with hypoxia (HCC)    PCP:  Pcp, No Pharmacy:   [facility] [address] [place] [address] Phone: [phone] Fax: [phone]  [facility] [address] [place] [address] Phone: [phone] Fax: [phone]     Social Drivers of Health (SDOH) Social History: SDOH Screenings   Food Insecurity: No Food Insecurity (12/26/2023)  Housing: Low Risk  (12/26/2023)  Transportation Needs: No Transportation Needs (12/26/2023)  Utilities: Not At Risk (12/26/2023)  Social Connections: Moderately Integrated (12/26/2023)  Tobacco Use: High Risk (12/26/2023)   SDOH Interventions:     Readmission Risk Interventions    12/26/2023    3:37 PM 12/22/2023   12:48 PM  Readmission Risk Prevention Plan  Transportation Screening Complete Complete  PCP or Specialist Appt within 3-5 Days Complete Complete  Social Work Consult for Recovery Care Planning/Counseling Complete Complete  Palliative Care Screening Not Applicable Not Applicable  Medication Review [facility]) Complete Complete

## 2023-12-26 NOTE — ED Notes (Signed)
Verified cardiac monitoring with CCMD

## 2023-12-26 NOTE — Progress Notes (Signed)
PROGRESS NOTE    Kenneth Benson  UJW:[id number] DOB: 08-25-1961 DOA: 12/25/2023 PCP: Pcp, No  Chief Complaint  Patient presents with   Chest Pain    Hospital Course:  Kenneth Benson is 63 y.o. male with paroxysmal atrial fibrillation, asthma, CHF, COPD, CAD, who presented to the ED with acute onset midsternal chest pain with radiation to back with associated dyspnea and palpitations.  He also endorses cough and wheezing.  In the ED temperature 99.7, heart rate 114 the 127 A-fib, mildly tachypneic.  Labs reveal BUN of 28, AST 56, ALT 141.  EKG revealed A-fib with controlled rate 97 and possible LVH with repolarization.  Chest x-ray revealed cardiomegaly, bibasilar atelectasis and possible small left pleural effusion.  CT abdomen shows stable bilateral large inguinal hernias, but no acute intra-abdominal process. Patient initially received 10 mg of IV Cardizem for A-fib RVR followed by DuoNebs, Solu-Medrol, morphine, Zofran, and 500 cc NS bolus.  He was admitted for observation and cardiology was consulted.  Subjective: On evaluation patient reports he is feeling much better now.  No chest pain.  No shortness of breath.  He reports he has had ongoing issues with A-fib for the last many years.  Reports a history of difficulty with rate control   Objective: Vitals:   12/26/23 0400 12/26/23 0430 12/26/23 0500 12/26/23 0530  BP: 109/67 (!) 106/57  91/62  Pulse: (!) 55 (!) 112  65  Resp: (!) 22 (!) 24  (!) 22  Temp:      TempSrc:      SpO2: 92% 91% 91% 93%    Intake/Output Summary (Last 24 hours) at 12/26/2023 5621 Last data filed at 12/26/2023 0511 Gross per 24 hour  Intake 600 ml  Output --  Net 600 ml   There were no vitals filed for this visit.  Examination: General exam: Appears calm and comfortable, NAD  Respiratory system: No work of breathing, mild end expiratory wheeze Cardiovascular system: S1 & S2 heard, rapid irregular rhythm Gastrointestinal system: Abdomen is  nondistended, soft and nontender.  Neuro: Alert and oriented. No focal neurological deficits. Extremities: Symmetric, expected ROM Skin: No rashes, lesions Psychiatry: Demonstrates appropriate judgement and insight. Mood & affect appropriate for situation.   Assessment & Plan:  Principal Problem:   Chest pain Active Problems:   COPD with acute exacerbation (HCC)   Chronic combined systolic and diastolic heart failure (HCC)   Paroxysmal atrial fibrillation (HCC)    Chest pain - Resolved - Likely secondary to A-fib RVR, resolved with diltiazem - Troponins have been unremarkable, no rising on repeat  Paroxysmal atrial fibrillation with RVR - Patient responded well to Cardizem bolus in the ED - Cardiology consulted, appreciate their input - Has been initiated on amiodarone, trend LFTs - Continue home dose diltiazem, digoxin, and metoprolol as BP allows  CAD - Resume home meds - Cardiology consult as above  COPD with acute exacerbation - Status post 125 Solu-Medrol - Continue with prednisone for now - As needed Mucinex DM - DuoNebs as needed  Chronic combined systolic and diastolic heart failure - Echo 1 year ago EF 45 to 50%. - Resume home meds - GDMT as blood pressure tolerates - Clinically euvolemic   Hyperkalemia - Status post Lokelma  Kidney cyst - Incidentally seen on CT, pole right kidney measuring 4.3 cm no follow-up imaging recommended  Stable bilateral inguinal hernias-as seen on CT - Left inguinal hernia contains nondilated bowel  Cholelithiasis - Asymptomatic.  Incidentally seen on CT.  HIDA scan if right upper quadrant pain occurs.  Compression deformity of L3 - Chronic, stable  DVT prophylaxis: Eliquis     Code Status: Full Code Family Communication: Discussed directly with patient Disposition:  Status is: Observation The patient remains OBS appropriate and will d/c before 2 midnights.    Consultants:    Treatment Team:  Consulting  Physician: Marcina Millard, MD  Procedures:    Antimicrobials:  Anti-infectives (From admission, onward)    Start     Dose/Rate Route Frequency Ordered Stop   12/26/23 0215  cefTRIAXone (ROCEPHIN) 1 g in sodium chloride 0.9 % 100 mL IVPB        1 g 200 mL/hr over 30 Minutes Intravenous Every 24 hours 12/26/23 0207 12/31/23 0159       Data Reviewed: I have personally reviewed following labs and imaging studies CBC: Recent Labs  Lab 12/21/23 1425 12/22/23 0642 12/25/23 2311 12/26/23 0518  WBC 4.6 5.0 8.4 6.4  HGB 16.8 15.8 17.3* 16.6  HCT 51.8 49.4 53.5* 52.7*  MCV 95.6 98.4 96.1 98.1  PLT 201 164 199 183   Basic Metabolic Panel: Recent Labs  Lab 12/21/23 1425 12/22/23 0642 12/25/23 2311 12/26/23 0518  NA 141 140 140 141  K 4.9 4.6 4.1 5.4*  CL 104 102 103 104  CO2 26 29 27 31   GLUCOSE 102* 140* 146* 151*  BUN 6* 10 28* 27*  CREATININE 0.89 0.85 0.87 0.85  CALCIUM 8.4* 8.3* 8.4* 7.8*   GFR: Estimated Creatinine Clearance: 125.7 mL/min (by C-G formula based on SCr of 0.85 mg/dL). Liver Function Tests: Recent Labs  Lab 12/21/23 1425 12/25/23 2311  AST 27 56*  ALT 18 141*  ALKPHOS 53 55  BILITOT 1.5* 0.6  PROT 7.3 6.9  ALBUMIN 3.8 3.5   CBG: No results for input(s): "GLUCAP" in the last 168 hours.  Recent Results (from the past 240 hours)  Resp panel by RT-PCR (RSV, Flu A&B, Covid) Anterior Nasal Swab     Status: Abnormal   Collection Time: 12/21/23  2:25 PM   Specimen: Anterior Nasal Swab  Result Value Ref Range Status   SARS Coronavirus 2 by RT PCR NEGATIVE NEGATIVE Final    Comment: (NOTE) SARS-CoV-2 target nucleic acids are NOT DETECTED.  The SARS-CoV-2 RNA is generally detectable in upper respiratory specimens during the acute phase of infection. The lowest concentration of SARS-CoV-2 viral copies this assay can detect is 138 copies/mL. A negative result does not preclude SARS-Cov-2 infection and should not be used as the sole basis for  treatment or other patient management decisions. A negative result may occur with  improper specimen collection/handling, submission of specimen other than nasopharyngeal swab, presence of viral mutation(s) within the areas targeted by this assay, and inadequate number of viral copies(<138 copies/mL). A negative result must be combined with clinical observations, patient history, and epidemiological information. The expected result is Negative.  Fact Sheet for Patients:  BloggerCourse.com  Fact Sheet for Healthcare Providers:  SeriousBroker.it  This test is no t yet approved or cleared by the [place] FDA and  has been authorized for detection and/or diagnosis of SARS-CoV-2 by FDA under an Emergency Use Authorization (EUA). This EUA will remain  in effect (meaning this test can be used) for the duration of the COVID-19 declaration under Section 564(b)(1) of the Act, 21 U.S.C.section 360bbb-3(b)(1), unless the authorization is terminated  or revoked sooner.       Influenza A by PCR NEGATIVE NEGATIVE Final  Influenza B by PCR NEGATIVE NEGATIVE Final    Comment: (NOTE) The Xpert Xpress SARS-CoV-2/FLU/RSV plus assay is intended as an aid in the diagnosis of influenza from Nasopharyngeal swab specimens and should not be used as a sole basis for treatment. Nasal washings and aspirates are unacceptable for Xpert Xpress SARS-CoV-2/FLU/RSV testing.  Fact Sheet for Patients: BloggerCourse.com  Fact Sheet for Healthcare Providers: SeriousBroker.it  This test is not yet approved or cleared by the [place] FDA and has been authorized for detection and/or diagnosis of SARS-CoV-2 by FDA under an Emergency Use Authorization (EUA). This EUA will remain in effect (meaning this test can be used) for the duration of the COVID-19 declaration under Section 564(b)(1) of the Act, 21  U.S.C. section 360bbb-3(b)(1), unless the authorization is terminated or revoked.     Resp Syncytial Virus by PCR POSITIVE (A) NEGATIVE Final    Comment: (NOTE) Fact Sheet for Patients: BloggerCourse.com  Fact Sheet for Healthcare Providers: SeriousBroker.it  This test is not yet approved or cleared by the [place] FDA and has been authorized for detection and/or diagnosis of SARS-CoV-2 by FDA under an Emergency Use Authorization (EUA). This EUA will remain in effect (meaning this test can be used) for the duration of the COVID-19 declaration under Section 564(b)(1) of the Act, 21 U.S.C. section 360bbb-3(b)(1), unless the authorization is terminated or revoked.  Performed at [facility] [address] [place], [place] [address]   Blood Culture (routine x 2)     Status: None   Collection Time: 12/21/23  2:56 PM   Specimen: BLOOD  Result Value Ref Range Status   Specimen Description BLOOD BLOOD RIGHT HAND  Final   Special Requests   Final    BOTTLES DRAWN AEROBIC AND ANAEROBIC Blood Culture adequate volume   Culture   Final    NO GROWTH 5 DAYS Performed at [facility], [address], [place], [place] [address]    Report Status 12/26/2023 FINAL  Final  Blood Culture (routine x 2)     Status: None   Collection Time: 12/21/23  2:58 PM   Specimen: BLOOD  Result Value Ref Range Status   Specimen Description BLOOD BLOOD LEFT HAND  Final   Special Requests   Final    BOTTLES DRAWN AEROBIC AND ANAEROBIC Blood Culture adequate volume   Culture   Final    NO GROWTH 5 DAYS Performed at [facility], [address], [place], [place] [address]    Report Status 12/26/2023 FINAL  Final     Radiology Studies: CT Renal Stone Study Result Date: 12/26/2023 CLINICAL DATA:  Abdominal and flank pain. EXAM: CT ABDOMEN AND PELVIS WITHOUT CONTRAST TECHNIQUE: Multidetector CT imaging of the abdomen and  pelvis was performed following the standard protocol without IV contrast. RADIATION DOSE REDUCTION: This exam was performed according to the departmental dose-optimization program which includes automated exposure control, adjustment of the mA and/or kV according to patient size and/or use of iterative reconstruction technique. COMPARISON:  CTA abdomen and pelvis 12/16/2022 FINDINGS: Lower chest: No acute abnormality. Hepatobiliary: Small gallstone is present. There is no biliary ductal dilatation. T fat along the falciform ligament he liver is within normal limits. There is likely focal fatty infiltration along the falciform ligament. Pancreas: Unremarkable. No pancreatic ductal dilatation or surrounding inflammatory changes. Spleen: Normal in size without focal abnormality. Adrenals/Urinary Tract: There is a cyst in the inferior pole the right kidney measuring 4.3 cm. Otherwise, the kidneys, adrenal glands and bladder  are within normal limits. Stomach/Bowel: Stomach is within normal limits. Appendix appears normal. No evidence of bowel wall thickening, distention, or inflammatory changes. Vascular/Lymphatic: No significant vascular findings are present. No enlarged abdominal or pelvic lymph nodes. Reproductive: Prostate is unremarkable. Other: There are large bilateral inguinal hernias. The left inguinal hernia contains nondilated bowel, unchanged. There is no ascites or free air. Musculoskeletal: Chronic compression deformity of L3 appears unchanged. Degenerative changes affect the hips. IMPRESSION: 1. No acute localizing process in the abdomen or pelvis. 2. Cholelithiasis. 3. Stable large bilateral inguinal hernias. The left inguinal hernia contains nondilated bowel. 4. Right Bosniak I benign renal cyst measuring 4.3 cm. No follow-up imaging is recommended. JACR 2018 Feb; 264-273, Management of the Incidental Renal Mass on CT, RadioGraphics 2021; 814-848, Bosniak Classification of Cystic Renal Masses, Version  2019. Electronically Signed   By: Darliss Cheney M.D.   On: 12/26/2023 01:10   DG Chest Port 1 View Result Date: 12/25/2023 CLINICAL DATA:  Chest pain. EXAM: PORTABLE CHEST 1 VIEW COMPARISON:  Radiograph 4 days ago 12/21/2023, CT 07/02/2023 FINDINGS: Stable cardiomegaly. Unchanged mediastinal contours. There may be a trace left pleural effusion. Mild bibasilar atelectasis without confluent airspace disease. No pulmonary edema. No pneumothorax. Stable osseous structures. IMPRESSION: 1. Chronic cardiomegaly. 2. Mild bibasilar atelectasis with possible small left pleural effusion. Electronically Signed   By: Narda Rutherford M.D.   On: 12/25/2023 23:32    Scheduled Meds:  apixaban  5 mg Oral BID   aspirin EC  81 mg Oral Daily   digoxin  0.125 mg Oral Daily   diltiazem  180 mg Oral Daily   empagliflozin  10 mg Oral Daily   guaiFENesin  600 mg Oral BID   ipratropium-albuterol  3 mL Nebulization QID   methylPREDNISolone (SOLU-MEDROL) injection  80 mg Intravenous Q24H   metoprolol tartrate  50 mg Oral BID   spironolactone  12.5 mg Oral Daily   torsemide  40 mg Oral Daily   Continuous Infusions:  sodium chloride 75 mL/hr at 12/26/23 0407   cefTRIAXone (ROCEPHIN)  IV Stopped (12/26/23 0511)     LOS: 0 days    Time spent:   Debarah Crape, DO [facility]  To contact the attending physician between 7A-7P please use Epic Chat. To contact the covering physician during after hours 7P-7A, please review Amion.   12/26/2023, 8:12 AM   *This document has been created with the assistance of dictation software. Please excuse typographical errors. *

## 2023-12-26 NOTE — ED Notes (Signed)
Resumed care from kat rn.  Pt alert  sinus on monitor.  Iv fluids infusing.  Pt waiting on admission bed.Marland Kitchen

## 2023-12-26 NOTE — ED Notes (Signed)
CCMD called and pt added to monitor

## 2023-12-26 NOTE — Assessment & Plan Note (Addendum)
-   The patient had initial RVR and responded to IV Cardizem bolus. - IV heparin wound replace Eliquis for now. - We will continue Cardizem CD and digoxin.

## 2023-12-26 NOTE — Progress Notes (Signed)
PHARMACIST - PHYSICIAN COMMUNICATION   CONCERNING: Methylprednisolone IV    Current order: Methylprednisolone IV 40mg  every 12 hours      DESCRIPTION: Per [place] Protocol:   IV methylprednisolone will be converted to either a q12h or q24h frequency with the same total daily dose (TDD).  Ordered Dose: 1 to 125 mg TDD; convert to: TDD q24h.  Ordered Dose: 126 to 250 mg TDD; convert to: TDD div q12h.  Ordered Dose: >250 mg TDD; DAW.  Order has been adjusted to: Methylprednisolone IV 80mg  every 24 hours    Gardner Candle , PharmD, BCPS Clinical Pharmacist  12/26/2023 7:05 AM

## 2023-12-26 NOTE — Assessment & Plan Note (Signed)
-   We will continue digoxin, Jardiance, Lopressor, Aldactone and Demadex.

## 2023-12-26 NOTE — ED Notes (Signed)
Has hard cough occasionally, and requests breathing tx.

## 2023-12-26 NOTE — Assessment & Plan Note (Deleted)
-   We will continue digoxin, Jardiance, Lopressor, Aldactone and Demadex.

## 2023-12-26 NOTE — ED Notes (Signed)
First encounter with pt. Pt is sleeping. VSS.

## 2023-12-26 NOTE — ED Notes (Signed)
Pt given a menu and the unit phone to call to order dinner.

## 2023-12-26 NOTE — Assessment & Plan Note (Addendum)
-  The patient will be admitted to an observation cardiac telemetry bed. - Will follow serial troponins and EKGs. - The patient will be placed on aspirin as well as p.r.n. sublingual nitroglycerin and morphine sulfate for pain. - We will obtain a cardiology consult in a.m. for further cardiac risk stratification. - I notified Dr. Darrold Junker about the patient

## 2023-12-26 NOTE — H&P (Addendum)
[place]   PATIENT NAME: Kenneth Benson    MR#:  [id number]  DATE OF BIRTH:  25-Jul-1961  DATE OF ADMISSION:  12/25/2023  PRIMARY CARE PHYSICIAN: Pcp, No   Patient is coming from: Home  REQUESTING/REFERRING PHYSICIAN: Chiquita Loth, MD  CHIEF COMPLAINT:   Chief Complaint  Patient presents with   Chest Pain    HISTORY OF PRESENT ILLNESS:  Kenneth Benson is a 63 y.o. male with medical history significant for paroxysmal atrial fibrillation, asthma, CHF, COPD, and coronary artery disease, who presented to the ER with acute onset of midsternal chest pain with radiation to her back as well as recent dyspnea with associated palpitations, cough which has been mainly dry as well as wheezing.  He denied any fever or chills.  She denied any abdominal pain or melena or bright red blood per rectum.  He quit smoking 7 days ago.  No dysuria, oliguria or hematuria or flank pain.  ED Course: When he came to the ER, temperature was 99.7, heart rate was 114 then 127 in atrial fibrillation with respiratory 12 with 30 and otherwise normal vital signs.  Labs revealed a BUN of 28 with low glucose of 146 and total protein 8.4 AST 56 ALT 141 and otherwise normal vital signs.   EKG as reviewed by me : EKG showed atrial fibrillation with controlled response of 97 with probable LVH with secondary repolarization. Imaging: Portable chest x-ray showed chronic cardiomegaly and mild bibasilar atelectasis with possible small left pleural effusion.  CT renal stone study revealed the following: 1. No acute localizing process in the abdomen or pelvis. 2. Cholelithiasis. 3. Stable large bilateral inguinal hernias. The left inguinal hernia contains nondilated bowel. 4. Right Bosniak I benign renal cyst measuring 4.3 cm. No follow-up imaging is recommended. JACR 2018 Feb; 264-273, Management of the Incidental Renal Mass on CT.  The patient was given 10 mg of IV Cardizem for initial rapid ventricular sponsor with  atrial fibrillation on monitor, DuoNeb, 125 mg IV Solu-Medrol, 4 mg of IV morphine sulfate and 4 mg of IV Zofran as well as 500 mL IV normal saline bolus and 4 baby aspirin.  He will be admitted to a progressive unit observation bed for further evaluation and management. PAST MEDICAL HISTORY:   Past Medical History:  Diagnosis Date   A-fib (HCC) 02/06/2022   AKI (acute kidney injury) (HCC) 12/17/2022   Arrhythmia    atrial fibrillation   Asthma    CHF (congestive heart failure) (HCC)    COPD (chronic obstructive pulmonary disease) (HCC)    Hyperkalemia 12/17/2022   Hypokalemia 04/13/2023   Myocardial injury 04/13/2023   Tobacco abuse     PAST SURGICAL HISTORY:  No past surgical history on file.  SOCIAL HISTORY:   Social History   Tobacco Use   Smoking status: Every Day    Current packs/day: 1.50    Average packs/day: 1.5 packs/day for 27.0 years (40.5 ttl pk-yrs)    Types: Cigarettes   Smokeless tobacco: Not on file   Tobacco comments:    0.5PPD at the most.  trying to quit.  05/11/2023 hfb  Substance Use Topics   Alcohol use: No    FAMILY HISTORY:   Family History  Problem Relation Age of Onset   Heart disease Mother    Atrial fibrillation Father     DRUG ALLERGIES:  No Known Allergies  REVIEW OF SYSTEMS:   ROS As per history of present illness. All pertinent  systems were reviewed above. Constitutional, HEENT, cardiovascular, respiratory, GI, GU, musculoskeletal, neuro, psychiatric, endocrine, integumentary and hematologic systems were reviewed and are otherwise negative/unremarkable except for positive findings mentioned above in the HPI.   MEDICATIONS AT HOME:   Prior to Admission medications   Medication Sig Start Date End Date Taking? Authorizing Provider  albuterol (PROVENTIL) (2.5 MG/3ML) 0.083% nebulizer solution Take 3 mLs (2.5 mg total) by nebulization every 6 (six) hours as needed for wheezing or shortness of breath. 07/10/23 12/21/23  Enedina Finner, MD   albuterol (VENTOLIN HFA) 108 (90 Base) MCG/ACT inhaler Inhale 2 puffs into the lungs every 6 (six) hours as needed for wheezing or shortness of breath. 07/10/23   Enedina Finner, MD  apixaban (ELIQUIS) 5 MG TABS tablet Take 1 tablet (5 mg total) by mouth 2 (two) times daily. 07/10/23   Enedina Finner, MD  arformoterol (BROVANA) 15 MCG/2ML NEBU Take 2 mLs (15 mcg total) by nebulization 2 (two) times daily. 07/10/23 12/21/23  Enedina Finner, MD  digoxin (LANOXIN) 0.125 MG tablet Take 1 tablet (0.125 mg total) by mouth daily. 09/02/23 12/21/23  Charise Killian, MD  diltiazem (CARDIZEM CD) 180 MG 24 hr capsule Take 1 capsule (180 mg total) by mouth daily. 09/02/23 12/21/23  Charise Killian, MD  JARDIANCE 10 MG TABS tablet Take 1 tablet (10 mg total) by mouth daily. 07/10/23   Enedina Finner, MD  metoprolol tartrate (LOPRESSOR) 50 MG tablet Take 1 tablet (50 mg total) by mouth 2 (two) times daily. 09/01/23 12/21/23  Charise Killian, MD  predniSONE (DELTASONE) 20 MG tablet Take 2 tablets (40 mg total) by mouth daily with breakfast for 3 days, THEN 1 tablet (20 mg total) daily with breakfast for 3 days. 12/24/23 12/30/23  Marrion Coy, MD  spironolactone (ALDACTONE) 25 MG tablet Take 0.5 tablets (12.5 mg total) by mouth daily. Hold this medication until you see your cardiologist 09/01/23   Charise Killian, MD  torsemide (DEMADEX) 20 MG tablet Take 2 tablets (40 mg total) by mouth daily. Hold this medication until you see your cardiologist 09/01/23 12/21/23  Charise Killian, MD  amiodarone (PACERONE) 200 MG tablet Take 1 tablet (200 mg total) by mouth 2 (two) times daily. 02/11/22 03/15/22  Lewie Chamber, MD      VITAL SIGNS:  Blood pressure 91/62, pulse 65, temperature 98.9 F (37.2 C), temperature source Oral, resp. rate (!) 22, SpO2 93%.  PHYSICAL EXAMINATION:  Physical Exam  GENERAL:  63 y.o.-year-old Caucasian male patient lying in the bed wit mild respiratory distress with conversational dyspnea.  EYES:  Pupils equal, round, reactive to light and accommodation. No scleral icterus. Extraocular muscles intact.  HEENT: Head atraumatic, normocephalic. Oropharynx and nasopharynx clear.  NECK:  Supple, no jugular venous distention. No thyroid enlargement, no tenderness.  LUNGS: Diffuse expiratory wheezes with tight expiratory airflow and harsh vesicular breathing.. No use of accessory muscles of respiration.  CARDIOVASCULAR: Here irregularly irregular rhythm S1, S2 normal. No murmurs, rubs, or gallops.  ABDOMEN: Soft, nondistended, nontender. Bowel sounds present. No organomegaly or mass.  EXTREMITIES: No pedal edema, cyanosis, or clubbing.  NEUROLOGIC: Cranial nerves II through XII are intact. Muscle strength 5/5 in all extremities. Sensation intact. Gait not checked.  PSYCHIATRIC: The patient is alert and oriented x 3.  Normal affect and good eye contact. SKIN: No obvious rash, lesion, or ulcer.   LABORATORY PANEL:   CBC Recent Labs  Lab 12/26/23 0518  WBC 6.4  HGB 16.6  HCT 52.7*  PLT 183   ------------------------------------------------------------------------------------------------------------------  Chemistries  Recent Labs  Lab 12/25/23 2311 12/26/23 0518  NA 140 141  K 4.1 5.4*  CL 103 104  CO2 27 31  GLUCOSE 146* 151*  BUN 28* 27*  CREATININE 0.87 0.85  CALCIUM 8.4* 7.8*  AST 56*  --   ALT 141*  --   ALKPHOS 55  --   BILITOT 0.6  --    ------------------------------------------------------------------------------------------------------------------  Cardiac Enzymes No results for input(s): "TROPONINI" in the last 168 hours. ------------------------------------------------------------------------------------------------------------------  RADIOLOGY:  CT Renal Stone Study Result Date: 12/26/2023 CLINICAL DATA:  Abdominal and flank pain. EXAM: CT ABDOMEN AND PELVIS WITHOUT CONTRAST TECHNIQUE: Multidetector CT imaging of the abdomen and pelvis was performed  following the standard protocol without IV contrast. RADIATION DOSE REDUCTION: This exam was performed according to the departmental dose-optimization program which includes automated exposure control, adjustment of the mA and/or kV according to patient size and/or use of iterative reconstruction technique. COMPARISON:  CTA abdomen and pelvis 12/16/2022 FINDINGS: Lower chest: No acute abnormality. Hepatobiliary: Small gallstone is present. There is no biliary ductal dilatation. T fat along the falciform ligament he liver is within normal limits. There is likely focal fatty infiltration along the falciform ligament. Pancreas: Unremarkable. No pancreatic ductal dilatation or surrounding inflammatory changes. Spleen: Normal in size without focal abnormality. Adrenals/Urinary Tract: There is a cyst in the inferior pole the right kidney measuring 4.3 cm. Otherwise, the kidneys, adrenal glands and bladder are within normal limits. Stomach/Bowel: Stomach is within normal limits. Appendix appears normal. No evidence of bowel wall thickening, distention, or inflammatory changes. Vascular/Lymphatic: No significant vascular findings are present. No enlarged abdominal or pelvic lymph nodes. Reproductive: Prostate is unremarkable. Other: There are large bilateral inguinal hernias. The left inguinal hernia contains nondilated bowel, unchanged. There is no ascites or free air. Musculoskeletal: Chronic compression deformity of L3 appears unchanged. Degenerative changes affect the hips. IMPRESSION: 1. No acute localizing process in the abdomen or pelvis. 2. Cholelithiasis. 3. Stable large bilateral inguinal hernias. The left inguinal hernia contains nondilated bowel. 4. Right Bosniak I benign renal cyst measuring 4.3 cm. No follow-up imaging is recommended. JACR 2018 Feb; 264-273, Management of the Incidental Renal Mass on CT, [employer] 2021; 814-848, Bosniak Classification of Cystic Renal Masses, Version 2019. Electronically  Signed   By: Darliss Cheney M.D.   On: 12/26/2023 01:10   DG Chest Port 1 View Result Date: 12/25/2023 CLINICAL DATA:  Chest pain. EXAM: PORTABLE CHEST 1 VIEW COMPARISON:  Radiograph 4 days ago 12/21/2023, CT 07/02/2023 FINDINGS: Stable cardiomegaly. Unchanged mediastinal contours. There may be a trace left pleural effusion. Mild bibasilar atelectasis without confluent airspace disease. No pulmonary edema. No pneumothorax. Stable osseous structures. IMPRESSION: 1. Chronic cardiomegaly. 2. Mild bibasilar atelectasis with possible small left pleural effusion. Electronically Signed   By: Narda Rutherford M.D.   On: 12/25/2023 23:32      IMPRESSION AND PLAN:  Assessment and Plan: * Chest pain - The patient will be admitted to an observation cardiac telemetry bed. - Will follow serial troponins and EKGs. - The patient will be placed on aspirin as well as p.r.n. sublingual nitroglycerin and morphine sulfate for pain. - We will obtain a cardiology consult in a.m. for further cardiac risk stratification. - I notified Dr. Darrold Junker about the patient   COPD with acute exacerbation (HCC) - We will place the patient IV steroid therapy with IV Solu-Medrol as well as nebulized bronchodilator therapy with duonebs q.i.d. and q.4 hours p.r.n.Marland Kitchen -  Mucolytic therapy will be provided with Mucinex and antibiotic therapy with IV Rocephin. - O2 protocol will be followed. -We will avoid long-acting beta agonist.  Chronic combined systolic and diastolic heart failure (HCC) - We will continue digoxin, Jardiance, Lopressor, Aldactone and Demadex.  Paroxysmal atrial fibrillation (HCC) - The patient had initial RVR and responded to IV Cardizem bolus. - IV heparin wound replace Eliquis for now. - We will continue Cardizem CD and digoxin.       DVT prophylaxis: Lovenox.  Advanced Care Planning:  Code Status: full code.  Family Communication:  The plan of care was discussed in details with the patient (and  family). I answered all questions. The patient agreed to proceed with the above mentioned plan. Further management will depend upon hospital course. Disposition Plan: Back to previous home environment Consults called: Cardiology All the records are reviewed and case discussed with ED provider.  Status is: Observation  I certify that at the time of admission, it is my clinical judgment that the patient will require inpatient hospital care extending more than 2 midnights.                            Dispo: The patient is from: Home              Anticipated d/c is to: Home              Patient currently is not medically stable to d/c.              Difficult to place patient: No  Hannah Beat M.D on 12/26/2023 at 7:06 AM  [facility]   From 7 PM-7 AM, contact night-coverage [email or web address]  CC: Primary care physician; Pcp, No

## 2023-12-26 NOTE — Consult Note (Addendum)
[name] MRN: [id number] DOB/AGE: June 03, 1961 63 y.o.  Admit date: 12/25/2023 Referring Physician Dr. Valente David Primary Physician Pcp, No  Primary Cardiologist Dr. Clotilde Dieter Reason for Consultation chest pain  HPI: Kenneth Benson is a 63 y.o. 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 Cardiology was consulted for further evaluation.   Patient reports that yesterday afternoon while he was at home he began having chest discomfort.  States that this felt like a persistent burning sensation.  He tried repositioning but nothing helped his symptoms.  Denies any exacerbating factors.  States that given his symptoms persisted he decided to come to the ED for further evaluation.  Workup in the ED notable for creatinine 0.87, potassium 4.1, AST 56, ALT 141. Hemoglobin 17.3, WBC 8.4.  Troponins trended 9 > 9.  EKG revealed atrial fibrillation, he had elevated rates up to the 140s in the ED and thus was given a dose of IV diltiazem with improvement in heart rate.  At the time of my evaluation this morning he is resting comfortably in ED stretcher.  States that overall he feels better now than he did yesterday.  Denies any chest pain currently.  States his breathing overall feels better.  He reports that recently he did have worsening shortness of breath and some wheezing.  Denies any lower extremity edema, cough, fever/chills.  He is currently on 3L [place], wears 2 L at baseline.  Review of systems complete and found to be negative unless listed above    Past Medical History:  Diagnosis Date   A-fib (HCC) 02/06/2022   AKI (acute kidney injury) (HCC) 12/17/2022   Arrhythmia    atrial fibrillation   Asthma    CHF (congestive heart failure) (HCC)    COPD (chronic obstructive pulmonary disease) (HCC)     Hyperkalemia 12/17/2022   Hypokalemia 04/13/2023   Myocardial injury 04/13/2023   Tobacco abuse     No past surgical history on file.  (Not in a hospital admission)  Social History   Socioeconomic History   Marital status: Single    Spouse name: Not on file   Number of children: Not on file   Years of education: Not on file   Highest education level: Not on file  Occupational History   Not on file  Tobacco Use   Smoking status: Every Day    Current packs/day: 1.50    Average packs/day: 1.5 packs/day for 27.0 years (40.5 ttl pk-yrs)    Types: Cigarettes   Smokeless tobacco: Not on file   Tobacco comments:    0.5PPD at the most.  trying to quit.  05/11/2023 hfb  Vaping Use   Vaping status: Never Used  Substance and Sexual Activity   Alcohol use: No   Drug use: No   Sexual activity: Not Currently  Other Topics Concern   Not on file  Social History Narrative   Not on file   Social Drivers of Health   Financial Resource Strain: Not on file  Food Insecurity: No Food Insecurity (12/22/2023)   Hunger Vital Sign    Worried About Running Out of Food in the Last Year: Never true    Ran Out of Food in the Last Year: Never true  Transportation Needs: No Transportation Needs (12/22/2023)   PRAPARE -  Administrator, Civil Service (Medical): No    Lack of Transportation (Non-Medical): No  Physical Activity: Not on file  Stress: Not on file  Social Connections: Unknown (12/22/2023)   Social Connection and Isolation Panel [NHANES]    Frequency of Communication with Friends and Family: Once a week    Frequency of Social Gatherings with Friends and Family: Once a week    Attends Religious Services: 1 to 4 times per year    Active Member of [employer] or Organizations: Not on file    Attends Banker Meetings: Not on file    Marital Status: Not on file  Intimate Partner Violence: Not At Risk (12/22/2023)   Humiliation, Afraid, Rape, and Kick questionnaire    Fear  of Current or Ex-Partner: No    Emotionally Abused: No    Physically Abused: No    Sexually Abused: No    Family History  Problem Relation Age of Onset   Heart disease Mother    Atrial fibrillation Father      Vitals:   12/26/23 0830 12/26/23 0909 12/26/23 1000 12/26/23 1100  BP: 116/74  121/78 119/70  Pulse: (!) 125  84 (!) 40  Resp: (!) 26  (!) 23 (!) 25  Temp: 97.8 F (36.6 C) 98 F (36.7 C)    TempSrc: Oral Oral    SpO2: 97%   95%    PHYSICAL EXAM General: Chronically ill-appearing male, well nourished, in no acute distress. HEENT: Normocephalic and atraumatic. Neck: No JVD.  Lungs: Normal respiratory effort on 3L [place]. + wheezing.  Heart: Irregularly irregular, fast rate. Normal S1 and S2 without gallops or murmurs.  Abdomen: Non-distended appearing.  Msk: Normal strength and tone for age. Extremities: Warm and well perfused. No clubbing, cyanosis. No edema.  Neuro: Alert and oriented X 3. Psych: Answers questions appropriately.   Labs: Basic Metabolic Panel: Recent Labs    12/25/23 2311 12/26/23 0518  NA 140 141  K 4.1 5.4*  CL 103 104  CO2 27 31  GLUCOSE 146* 151*  BUN 28* 27*  CREATININE 0.87 0.85  CALCIUM 8.4* 7.8*   Liver Function Tests: Recent Labs    12/25/23 2311  AST 56*  ALT 141*  ALKPHOS 55  BILITOT 0.6  PROT 6.9  ALBUMIN 3.5   Recent Labs    12/25/23 2311  LIPASE 31   CBC: Recent Labs    12/25/23 2311 12/26/23 0518  WBC 8.4 6.4  HGB 17.3* 16.6  HCT 53.5* 52.7*  MCV 96.1 98.1  PLT 199 183   Cardiac Enzymes: Recent Labs    12/25/23 2311 12/26/23 0518  TROPONINIHS 9 9   BNP: No results for input(s): "BNP" in the last 72 hours. D-Dimer: No results for input(s): "DDIMER" in the last 72 hours. Hemoglobin A1C: No results for input(s): "HGBA1C" in the last 72 hours. Fasting Lipid Panel: No results for input(s): "CHOL", "HDL", "LDLCALC", "TRIG", "CHOLHDL", "LDLDIRECT" in the last 72 hours. Thyroid Function Tests: No  results for input(s): "TSH", "T4TOTAL", "T3FREE", "THYROIDAB" in the last 72 hours.  Invalid input(s): "FREET3" Anemia Panel: No results for input(s): "VITAMINB12", "FOLATE", "FERRITIN", "TIBC", "IRON", "RETICCTPCT" in the last 72 hours.   Radiology: CT Renal Stone Study Result Date: 12/26/2023 CLINICAL DATA:  Abdominal and flank pain. EXAM: CT ABDOMEN AND PELVIS WITHOUT CONTRAST TECHNIQUE: Multidetector CT imaging of the abdomen and pelvis was performed following the standard protocol without IV contrast. RADIATION DOSE REDUCTION: This exam was performed according to  the departmental dose-optimization program which includes automated exposure control, adjustment of the mA and/or kV according to patient size and/or use of iterative reconstruction technique. COMPARISON:  CTA abdomen and pelvis 12/16/2022 FINDINGS: Lower chest: No acute abnormality. Hepatobiliary: Small gallstone is present. There is no biliary ductal dilatation. T fat along the falciform ligament he liver is within normal limits. There is likely focal fatty infiltration along the falciform ligament. Pancreas: Unremarkable. No pancreatic ductal dilatation or surrounding inflammatory changes. Spleen: Normal in size without focal abnormality. Adrenals/Urinary Tract: There is a cyst in the inferior pole the right kidney measuring 4.3 cm. Otherwise, the kidneys, adrenal glands and bladder are within normal limits. Stomach/Bowel: Stomach is within normal limits. Appendix appears normal. No evidence of bowel wall thickening, distention, or inflammatory changes. Vascular/Lymphatic: No significant vascular findings are present. No enlarged abdominal or pelvic lymph nodes. Reproductive: Prostate is unremarkable. Other: There are large bilateral inguinal hernias. The left inguinal hernia contains nondilated bowel, unchanged. There is no ascites or free air. Musculoskeletal: Chronic compression deformity of L3 appears unchanged. Degenerative changes  affect the hips. IMPRESSION: 1. No acute localizing process in the abdomen or pelvis. 2. Cholelithiasis. 3. Stable large bilateral inguinal hernias. The left inguinal hernia contains nondilated bowel. 4. Right Bosniak I benign renal cyst measuring 4.3 cm. No follow-up imaging is recommended. Kenneth Benson; [id number], Management of the Incidental Renal Mass on CT, [employer] 2021; 814-848, Bosniak Classification of Cystic Renal Masses, Version 2019. Electronically Signed   By: Darliss Cheney M.D.   On: 12/26/2023 01:10   DG Chest Port 1 View Result Date: 12/25/2023 CLINICAL DATA:  Chest pain. EXAM: PORTABLE CHEST 1 VIEW COMPARISON:  Radiograph 4 days ago 12/21/2023, CT 07/02/2023 FINDINGS: Stable cardiomegaly. Unchanged mediastinal contours. There may be a trace left pleural effusion. Mild bibasilar atelectasis without confluent airspace disease. No pulmonary edema. No pneumothorax. Stable osseous structures. IMPRESSION: 1. Chronic cardiomegaly. 2. Mild bibasilar atelectasis with possible small left pleural effusion. Electronically Signed   By: Kenneth Rutherford M.D.   On: 12/25/2023 23:32   DG Chest 2 View Result Date: 12/21/2023 CLINICAL DATA:  Shortness of breath. EXAM: CHEST - 2 VIEW COMPARISON:  Chest radiograph dated 09/01/2023. FINDINGS: The heart size and mediastinal contours are within normal limits. Both lungs are clear. The visualized skeletal structures are unremarkable. IMPRESSION: No active cardiopulmonary disease. Electronically Signed   By: Kenneth Collard M.D.   On: 12/21/2023 15:41    ECHO 11/2022: 1. Left ventricular ejection fraction, by estimation, is 45 to 50%. The left ventricle has mildly decreased function. The left ventricle has no regional wall motion abnormalities. The left ventricular internal cavity size was mildly dilated. Left ventricular diastolic parameters were normal.   2. Right ventricular systolic function is normal. The right ventricular size is normal. There is mildly  elevated pulmonary artery systolic pressure.   3. The mitral valve is normal in structure. Moderate mitral valve  regurgitation. No evidence of mitral stenosis.   4. Tricuspid valve regurgitation is moderate.   5. The aortic valve is normal in structure. Aortic valve regurgitation is  not visualized. No aortic stenosis is present.   6. The inferior vena cava is normal in size with greater than 50%  respiratory variability, suggesting right atrial pressure of 3 mmHg.   TELEMETRY reviewed by me 12/26/2023: atrial fibrillation rate 90-100s  EKG reviewed by me: atrial fibrillation rate 97 bpm  Data reviewed by me 12/26/2023: last 24h vitals tele labs imaging I/O ED provider  note, admission H&P  Principal Problem:   Chest pain Active Problems:   Chronic combined systolic and diastolic heart failure (HCC)   Paroxysmal atrial fibrillation (HCC)   COPD with acute exacerbation (HCC)   Atrial fibrillation with RVR (HCC)    ASSESSMENT AND PLAN:  Kenneth Benson is a 63 y.o. 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 Cardiology was consulted for further evaluation.   # Chest pain # Atrial fibrillation RVR # Paroxysmal atrial fibrillation # COPD exacerbation Patient presented with chest pain, in AF RVR with rates up to the 140s on admission. Resolution of CP symptoms with IV diltiazem. BP borderline low.  -Start amiodarone 400 mg twice daily for rhythm control.  Will check LFTs tomorrow as these were mildly elevated on admission. -Continue po diltiazem 180 mg daily and metoprolol tartrate 50 mg twice daily as BP allows. Continue digoxin 0.125 mg daily.  -Continue eliquis 5 mg twice daily.  -Further management of COPD exacerbation as per primary. -Suspect chest pain symptoms secondary to A-fib RVR given symptoms resolved with IV diltiazem and improved rate control.  No  plan for further cardiac diagnostics at this time.  # Chronic HFmrEF # Hypotension Patient with hx of heart failure with mildly reduced EF 45-50% on echo 11/2022.  -Continue spironolactone 12.5 mg daily, jardiance 10 mg daily, torsemide 40 mg daily.   This patient's plan of care was discussed and created with Dr. Darrold Junker and he is in agreement.  Signed: Gale Journey, PA-C  12/26/2023, 11:20 AM [facility]

## 2023-12-27 DIAGNOSIS — R079 Chest pain, unspecified: Secondary | ICD-10-CM | POA: Diagnosis not present

## 2023-12-27 LAB — CBC WITH DIFFERENTIAL/PLATELET
Abs Immature Granulocytes: 0.08 10*3/uL — ABNORMAL HIGH (ref 0.00–0.07)
Basophils Absolute: 0 10*3/uL (ref 0.0–0.1)
Basophils Relative: 0 %
Eosinophils Absolute: 0 10*3/uL (ref 0.0–0.5)
Eosinophils Relative: 0 %
HCT: 47.9 % (ref 39.0–52.0)
Hemoglobin: 15.6 g/dL (ref 13.0–17.0)
Immature Granulocytes: 1 %
Lymphocytes Relative: 6 %
Lymphs Abs: 0.8 10*3/uL (ref 0.7–4.0)
MCH: 30.8 pg (ref 26.0–34.0)
MCHC: 32.6 g/dL (ref 30.0–36.0)
MCV: 94.7 fL (ref 80.0–100.0)
Monocytes Absolute: 0.9 10*3/uL (ref 0.1–1.0)
Monocytes Relative: 6 %
Neutro Abs: 12.8 10*3/uL — ABNORMAL HIGH (ref 1.7–7.7)
Neutrophils Relative %: 87 %
Platelets: 198 10*3/uL (ref 150–400)
RBC: 5.06 MIL/uL (ref 4.22–5.81)
RDW: 13.9 % (ref 11.5–15.5)
WBC: 14.6 10*3/uL — ABNORMAL HIGH (ref 4.0–10.5)
nRBC: 0 % (ref 0.0–0.2)

## 2023-12-27 LAB — COMPREHENSIVE METABOLIC PANEL
ALT: 90 U/L — ABNORMAL HIGH (ref 0–44)
AST: 21 U/L (ref 15–41)
Albumin: 3.1 g/dL — ABNORMAL LOW (ref 3.5–5.0)
Alkaline Phosphatase: 45 U/L (ref 38–126)
Anion gap: 8 (ref 5–15)
BUN: 32 mg/dL — ABNORMAL HIGH (ref 8–23)
CO2: 33 mmol/L — ABNORMAL HIGH (ref 22–32)
Calcium: 7.7 mg/dL — ABNORMAL LOW (ref 8.9–10.3)
Chloride: 98 mmol/L (ref 98–111)
Creatinine, Ser: 0.96 mg/dL (ref 0.61–1.24)
GFR, Estimated: >60 mL/min (ref >60)
Glucose, Bld: 131 mg/dL — ABNORMAL HIGH (ref 70–99)
Potassium: 4.3 mmol/L (ref 3.5–5.1)
Sodium: 139 mmol/L (ref 135–145)
Total Bilirubin: 0.6 mg/dL (ref 0.0–1.2)
Total Protein: 6.3 g/dL — ABNORMAL LOW (ref 6.5–8.1)

## 2023-12-27 LAB — PHOSPHORUS: Phosphorus: 4.7 mg/dL — ABNORMAL HIGH (ref 2.5–4.6)

## 2023-12-27 LAB — MAGNESIUM: Magnesium: 2.3 mg/dL (ref 1.7–2.4)

## 2023-12-27 LAB — FOLATE: Folate: 10.8 ng/mL (ref >5.9)

## 2023-12-27 MED ORDER — PREDNISONE 20 MG PO TABS
40.0000 mg | ORAL_TABLET | Freq: Every day | ORAL | Status: AC
  Administered 2023-12-30 – 2024-01-01 (×3): 40 mg via ORAL
  Filled 2023-12-27 (×3): qty 2

## 2023-12-27 MED ORDER — METHYLPREDNISOLONE SODIUM SUCC 125 MG IJ SOLR
80.0000 mg | Freq: Two times a day (BID) | INTRAMUSCULAR | Status: AC
  Administered 2023-12-27 – 2023-12-29 (×4): 80 mg via INTRAVENOUS
  Filled 2023-12-27 (×4): qty 2

## 2023-12-27 MED ORDER — FLUTICASONE FUROATE-VILANTEROL 200-25 MCG/ACT IN AEPB
1.0000 | INHALATION_SPRAY | Freq: Every day | RESPIRATORY_TRACT | Status: DC
  Administered 2023-12-28 – 2024-01-01 (×5): 1 via RESPIRATORY_TRACT
  Filled 2023-12-27: qty 28

## 2023-12-27 NOTE — ED Notes (Signed)
Pt sleeping.

## 2023-12-27 NOTE — Progress Notes (Signed)
[facility] CLINIC CARDIOLOGY PROGRESS NOTE       Patient ID: Kenneth Benson MRN: [id number] DOB/AGE: Apr 01, 1961 63 y.o.  Admit date: 12/25/2023 Referring Physician Dr. Valente David Primary Physician Pcp, No  Primary Cardiologist Dr. Clotilde Dieter Reason for Consultation chest pain  HPI: Kenneth Benson is a 63 y.o. 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 Cardiology was consulted for further evaluation.   Interval history: -Patient reports he is feeling well today, no complaints at the time of my evaluation.  -Denies any recurrence of chest pain symptoms. States SOB/wheezing is stable.  -HR remains controlled. BP improved today.   Review of systems complete and found to be negative unless listed above    Past Medical History:  Diagnosis Date   A-fib (HCC) 02/06/2022   AKI (acute kidney injury) (HCC) 12/17/2022   Arrhythmia    atrial fibrillation   Asthma    CHF (congestive heart failure) (HCC)    COPD (chronic obstructive pulmonary disease) (HCC)    Hyperkalemia 12/17/2022   Hypokalemia 04/13/2023   Myocardial injury 04/13/2023   Tobacco abuse     History reviewed. No pertinent surgical history.  (Not in a hospital admission)  Social History   Socioeconomic History   Marital status: Single    Spouse name: Not on file   Number of children: Not on file   Years of education: Not on file   Highest education level: Not on file  Occupational History   Not on file  Tobacco Use   Smoking status: Every Day    Current packs/day: 1.50    Average packs/day: 1.5 packs/day for 27.0 years (40.5 ttl pk-yrs)    Types: Cigarettes   Smokeless tobacco: Not on file   Tobacco comments:    0.5PPD at the most.  trying to quit.  05/11/2023 hfb  Vaping Use   Vaping status: Never Used  Substance and Sexual Activity   Alcohol use: No   Drug use: No   Sexual  activity: Not Currently  Other Topics Concern   Not on file  Social History Narrative   Not on file   Social Drivers of Health   Financial Resource Strain: Not on file  Food Insecurity: No Food Insecurity (12/26/2023)   Hunger Vital Sign    Worried About Running Out of Food in the Last Year: Never true    Ran Out of Food in the Last Year: Never true  Transportation Needs: No Transportation Needs (12/26/2023)   PRAPARE - Administrator, Civil Service (Medical): No    Lack of Transportation (Non-Medical): No  Physical Activity: Not on file  Stress: Not on file  Social Connections: Moderately Integrated (12/26/2023)   Social Connection and Isolation Panel [NHANES]    Frequency of Communication with Friends and Family: Once a week    Frequency of Social Gatherings with Friends and Family: Once a week    Attends Religious Services: 1 to 4 times per year    Active Member of [employer] or Organizations: No    Attends Banker Meetings: 1 to 4 times per year    Marital Status: Living with partner  Intimate Partner Violence: Not At Risk (12/26/2023)   Humiliation, Afraid, Rape, and Kick questionnaire    Fear of Current or Ex-Partner: No    Emotionally Abused: No    Physically Abused: No  Sexually Abused: No    Family History  Problem Relation Age of Onset   Heart disease Mother    Atrial fibrillation Father      Vitals:   12/27/23 0446 12/27/23 0630 12/27/23 0700 12/27/23 0900  BP:  (!) 96/52 109/66 106/64  Pulse:  69 71 83  Resp:      Temp: 98 F (36.7 C)     TempSrc: Oral     SpO2:   96%   Height:        PHYSICAL EXAM General: Chronically ill-appearing male, well nourished, in no acute distress. HEENT: Normocephalic and atraumatic. Neck: No JVD.  Lungs: Normal respiratory effort on 3L [place]. + wheezing.  Heart: Irregularly irregular, fast rate. Normal S1 and S2 without gallops or murmurs.  Abdomen: Non-distended appearing.  Msk: Normal strength and  tone for age. Extremities: Warm and well perfused. No clubbing, cyanosis. No edema.  Neuro: Alert and oriented X 3. Psych: Answers questions appropriately.   Labs: Basic Metabolic Panel: Recent Labs    12/26/23 0518 12/27/23 0440  NA 141 139  K 5.4* 4.3  CL 104 98  CO2 31 33*  GLUCOSE 151* 131*  BUN 27* 32*  CREATININE 0.85 0.96  CALCIUM 7.8* 7.7*  MG  --  2.3  PHOS  --  4.7*   Liver Function Tests: Recent Labs    12/25/23 2311 12/27/23 0440  AST 56* 21  ALT 141* 90*  ALKPHOS 55 45  BILITOT 0.6 0.6  PROT 6.9 6.3*  ALBUMIN 3.5 3.1*   Recent Labs    12/25/23 2311  LIPASE 31   CBC: Recent Labs    12/26/23 0518 12/27/23 0440  WBC 6.4 14.6*  NEUTROABS  --  12.8*  HGB 16.6 15.6  HCT 52.7* 47.9  MCV 98.1 94.7  PLT 183 198   Cardiac Enzymes: Recent Labs    12/25/23 2311 12/26/23 0518  TROPONINIHS 9 9   BNP: No results for input(s): "BNP" in the last 72 hours. D-Dimer: No results for input(s): "DDIMER" in the last 72 hours. Hemoglobin A1C: No results for input(s): "HGBA1C" in the last 72 hours. Fasting Lipid Panel: No results for input(s): "CHOL", "HDL", "LDLCALC", "TRIG", "CHOLHDL", "LDLDIRECT" in the last 72 hours. Thyroid Function Tests: No results for input(s): "TSH", "T4TOTAL", "T3FREE", "THYROIDAB" in the last 72 hours.  Invalid input(s): "FREET3" Anemia Panel: No results for input(s): "VITAMINB12", "FOLATE", "FERRITIN", "TIBC", "IRON", "RETICCTPCT" in the last 72 hours.   Radiology: CT Renal Stone Study Result Date: 12/26/2023 CLINICAL DATA:  Abdominal and flank pain. EXAM: CT ABDOMEN AND PELVIS WITHOUT CONTRAST TECHNIQUE: Multidetector CT imaging of the abdomen and pelvis was performed following the standard protocol without IV contrast. RADIATION DOSE REDUCTION: This exam was performed according to the departmental dose-optimization program which includes automated exposure control, adjustment of the mA and/or kV according to patient size  and/or use of iterative reconstruction technique. COMPARISON:  CTA abdomen and pelvis 12/16/2022 FINDINGS: Lower chest: No acute abnormality. Hepatobiliary: Small gallstone is present. There is no biliary ductal dilatation. T fat along the falciform ligament he liver is within normal limits. There is likely focal fatty infiltration along the falciform ligament. Pancreas: Unremarkable. No pancreatic ductal dilatation or surrounding inflammatory changes. Spleen: Normal in size without focal abnormality. Adrenals/Urinary Tract: There is a cyst in the inferior pole the right kidney measuring 4.3 cm. Otherwise, the kidneys, adrenal glands and bladder are within normal limits. Stomach/Bowel: Stomach is within normal limits. Appendix appears normal. No evidence  of bowel wall thickening, distention, or inflammatory changes. Vascular/Lymphatic: No significant vascular findings are present. No enlarged abdominal or pelvic lymph nodes. Reproductive: Prostate is unremarkable. Other: There are large bilateral inguinal hernias. The left inguinal hernia contains nondilated bowel, unchanged. There is no ascites or free air. Musculoskeletal: Chronic compression deformity of L3 appears unchanged. Degenerative changes affect the hips. IMPRESSION: 1. No acute localizing process in the abdomen or pelvis. 2. Cholelithiasis. 3. Stable large bilateral inguinal hernias. The left inguinal hernia contains nondilated bowel. 4. Right Bosniak I benign renal cyst measuring 4.3 cm. No follow-up imaging is recommended. Kenneth Benson; [id number], Management of the Incidental Renal Mass on CT, [employer] 2021; 814-848, Bosniak Classification of Cystic Renal Masses, Version 2019. Electronically Signed   By: Darliss Cheney M.D.   On: 12/26/2023 01:10   DG Chest Port 1 View Result Date: 12/25/2023 CLINICAL DATA:  Chest pain. EXAM: PORTABLE CHEST 1 VIEW COMPARISON:  Radiograph 4 days ago 12/21/2023, CT 07/02/2023 FINDINGS: Stable cardiomegaly.  Unchanged mediastinal contours. There may be a trace left pleural effusion. Mild bibasilar atelectasis without confluent airspace disease. No pulmonary edema. No pneumothorax. Stable osseous structures. IMPRESSION: 1. Chronic cardiomegaly. 2. Mild bibasilar atelectasis with possible small left pleural effusion. Electronically Signed   By: Narda Rutherford M.D.   On: 12/25/2023 23:32   DG Chest 2 View Result Date: 12/21/2023 CLINICAL DATA:  Shortness of breath. EXAM: CHEST - 2 VIEW COMPARISON:  Chest radiograph dated 09/01/2023. FINDINGS: The heart size and mediastinal contours are within normal limits. Both lungs are clear. The visualized skeletal structures are unremarkable. IMPRESSION: No active cardiopulmonary disease. Electronically Signed   By: Elgie Collard M.D.   On: 12/21/2023 15:41    ECHO 11/2022: 1. Left ventricular ejection fraction, by estimation, is 45 to 50%. The left ventricle has mildly decreased function. The left ventricle has no regional wall motion abnormalities. The left ventricular internal cavity size was mildly dilated. Left ventricular diastolic parameters were normal.   2. Right ventricular systolic function is normal. The right ventricular size is normal. There is mildly elevated pulmonary artery systolic pressure.   3. The mitral valve is normal in structure. Moderate mitral valve  regurgitation. No evidence of mitral stenosis.   4. Tricuspid valve regurgitation is moderate.   5. The aortic valve is normal in structure. Aortic valve regurgitation is  not visualized. No aortic stenosis is present.   6. The inferior vena cava is normal in size with greater than 50%  respiratory variability, suggesting right atrial pressure of 3 mmHg.   TELEMETRY reviewed by me 12/27/2023: atrial fibrillation rate 80s  EKG reviewed by me: atrial fibrillation rate 97 bpm  Data reviewed by me 12/27/2023: last 24h vitals tele labs imaging I/O hospitalist progress note  Principal  Problem:   Chest pain Active Problems:   Chronic combined systolic and diastolic heart failure (HCC)   Paroxysmal atrial fibrillation (HCC)   COPD with acute exacerbation (HCC)   Atrial fibrillation with RVR (HCC)    ASSESSMENT AND PLAN:  JET ARMBRUST is a 63 y.o. 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 Cardiology was consulted for further evaluation.   # Chest pain # Atrial fibrillation RVR # Paroxysmal atrial fibrillation # COPD exacerbation Patient presented with chest pain, in AF RVR with rates up to the 140s on admission. Resolution of CP symptoms with  IV diltiazem. BP borderline low.  -Continue amiodarone 400 mg twice daily for rhythm control.  This will likely not be a long term option given his significant COPD.  -Continue po diltiazem 180 mg daily and metoprolol tartrate 50 mg twice daily as BP allows. Continue digoxin 0.125 mg daily.  -Continue eliquis 5 mg twice daily.  -Further management of COPD exacerbation as per primary. -Suspect chest pain symptoms secondary to A-fib RVR given symptoms resolved with IV diltiazem and improved rate control.  No plan for further cardiac diagnostics at this time.  # Chronic HFmrEF # Hypotension Patient with hx of heart failure with mildly reduced EF 45-50% on echo 11/2022.  -Continue spironolactone 12.5 mg daily, jardiance 10 mg daily, torsemide 40 mg daily.   This patient's plan of care was discussed and created with Dr. Darrold Junker and he is in agreement.  Signed: Gale Journey, PA-C  12/27/2023, 9:55 AM [facility]

## 2023-12-27 NOTE — ED Notes (Signed)
TRANSPORT PAGED @ THIS TIME TO TRANSFER PT TO ASSIGNED IN-PATIENT ROOM

## 2023-12-27 NOTE — Progress Notes (Signed)
[facility] Progress Note  Patient: Kenneth Benson    ZOX:[id number]  DOA: 12/25/2023     Date of Service: the patient was seen and examined on 12/27/2023  Chief Complaint  Patient presents with   Chest Pain   Brief hospital course: TYMON NEMETZ is a 63 y.o. male with medical history significant for paroxysmal atrial fibrillation, asthma, CHF, COPD, and coronary artery disease, who presented to the ER with acute onset of midsternal chest pain with radiation to her back as well as recent dyspnea with associated palpitations, cough which has been mainly dry as well as wheezing.  He denied any fever or chills.  She denied any abdominal pain or melena or bright red blood per rectum.  He quit smoking 7 days ago.  No dysuria, oliguria or hematuria or flank pain.   ED Course: When he came to the ER, temperature was 99.7, heart rate was 114 then 127 in atrial fibrillation with respiratory 12 with 30 and otherwise normal vital signs.  Labs revealed a BUN of 28 with low glucose of 146 and total protein 8.4 AST 56 ALT 141 and otherwise normal vital signs.    EKG as reviewed by me : EKG showed atrial fibrillation with controlled response of 97 with probable LVH with secondary repolarization. Imaging: Portable chest x-ray showed chronic cardiomegaly and mild bibasilar atelectasis with possible small left pleural effusion.   CT renal stone study: 1. No acute localizing process in the abdomen or pelvis. 2. Cholelithiasis. 3. Stable large bilateral inguinal hernias. The left inguinal hernia contains nondilated bowl. 4. Right Bosniak I benign renal cyst measuring 4.3 cm. No follow-up imaging is recommended.   The patient was given 10 mg of IV Cardizem for initial rapid ventricular sponsor with atrial fibrillation on monitor, DuoNeb, 125 mg IV Solu-Medrol, 4 mg of IV morphine sulfate and 4 mg of IV Zofran as well as 500 mL IV normal saline bolus and 4 baby aspirin.  He will be admitted to a progressive unit  observation bed for further evaluation and management.   Assessment and Plan:  # COPD with acute exacerbation Patient was recently admitted due to RSV on 12/21/2023 Solu-Medrol 80 mg every 12 hourly x 2 days followed by half prednisone 40 mg p.o. daily for 3 days. Started Breo Ellipta inhaler, DuoNeb every 6 hourly, Mucinex 600 mg p.o. twice daily Tussionex as needed for cough   # Paroxysmal atrial fibrillation  S/p Cardizem IV bolus, responded well. Continue Eliquis 5 mg p.o. twice daily Continue Cardizem CD1 80 mg p.o. daily, metoprolol 50 mg p.o. twice daily, digoxin 0.125 mg p.o. daily Started amiodarone 400 mg p.o. twice daily for 10 days Continue to follow cardiology for further recommendation  # Chest pain, resolved Troponin x 2 negative Continue aspirin 81 mg p.o. daily, nitro as needed Seen by cardiology, recommended chest pain secondary to A-fib with RVR and COPD.  No plan for further cardiac testing at this time     # Chronic combined systolic and diastolic heart failure (HCC) -  continue digoxin, Jardiance, Lopressor, Aldactone and Demadex.   Body mass index is 37.97 kg/m.  Interventions:  Diet: Regular diet (patient does not want cardiac diet.) DVT Prophylaxis: Subcutaneous Lovenox   Advance goals of care discussion: Full code  Family Communication: family was Not present at bedside, at the time of interview.  The pt provided permission to discuss medical plan with the family. Opportunity was given to ask question and all questions were  answered satisfactorily.   Disposition:  Pt is from Home, admitted with Resp failure, A. rvr, still has SOB, which precludes a safe discharge. Discharge to Home, when stable and cleared by cardiology, may need 1-2 more days to improve.  Subjective: No significant events overnight, patient had A-fib with RVR which responded well with Cardizem bolus.  Currently patient is denying any chest pain or palpitations.  Still has cough  with phlegm production. Patient stated that he was recently discharged but was not feeling well and has to come back because of the difficulty breathing.  Physical Exam: General: NAD, lying comfortably Appear in no distress, affect appropriate Eyes: PERRLA ENT: Oral Mucosa Clear, moist  Neck: no JVD,  Cardiovascular: S1 and S2 Present, no Murmur, irregular rhythm Respiratory: Equal air entry bilaterally, mild crackles and significant wheezes Abdomen: Bowel Sound present, Soft and no tenderness,  Skin: no rashes Extremities: no Pedal edema, no calf tenderness Neurologic: without any new focal findings Gait not checked due to patient safety concerns  Vitals:   12/27/23 0700 12/27/23 0900 12/27/23 1000 12/27/23 1003  BP: 109/66 106/64 106/70   Pulse: 71 83 85   Resp:   (!) 22   Temp:   98.2 F (36.8 C)   TempSrc:   Oral   SpO2: 96%  95%   Weight:    127 kg  Height:    6' (1.829 m)    Intake/Output Summary (Last 24 hours) at 12/27/2023 1709 Last data filed at 12/26/2023 1911 Gross per 24 hour  Intake --  Output 800 ml  Net -800 ml   Filed Weights   12/27/23 1003  Weight: 127 kg    Data Reviewed: I have personally reviewed and interpreted daily labs, tele strips, imagings as discussed above. I reviewed all nursing notes, pharmacy notes, vitals, pertinent old records I have discussed plan of care as described above with RN and patient/family.  CBC: Recent Labs  Lab 12/21/23 1425 12/22/23 0642 12/25/23 2311 12/26/23 0518 12/27/23 0440  WBC 4.6 5.0 8.4 6.4 14.6*  NEUTROABS  --   --   --   --  12.8*  HGB 16.8 15.8 17.3* 16.6 15.6  HCT 51.8 49.4 53.5* 52.7* 47.9  MCV 95.6 98.4 96.1 98.1 94.7  PLT 201 164 199 183 198   Basic Metabolic Panel: Recent Labs  Lab 12/21/23 1425 12/22/23 0642 12/25/23 2311 12/26/23 0518 12/27/23 0440  NA 141 140 140 141 139  K 4.9 4.6 4.1 5.4* 4.3  CL 104 102 103 104 98  CO2 26 29 27 31  33*  GLUCOSE 102* 140* 146* 151* 131*  BUN  6* 10 28* 27* 32*  CREATININE 0.89 0.85 0.87 0.85 0.96  CALCIUM 8.4* 8.3* 8.4* 7.8* 7.7*  MG  --   --   --   --  2.3  PHOS  --   --   --   --  4.7*    Studies: No results found.  Scheduled Meds:  amiodarone  400 mg Oral BID   apixaban  5 mg Oral BID   aspirin EC  81 mg Oral Daily   digoxin  0.125 mg Oral Daily   diltiazem  180 mg Oral Daily   empagliflozin  10 mg Oral Daily   [START ON 12/28/2023] fluticasone furoate-vilanterol  1 puff Inhalation Daily   guaiFENesin  600 mg Oral BID   ipratropium-albuterol  3 mL Nebulization QID   [START ON 12/28/2023] methylPREDNISolone (SOLU-MEDROL) injection  80 mg Intravenous Q12H  Followed by   Melene Muller ON 12/30/2023] predniSONE  40 mg Oral Q breakfast   metoprolol tartrate  50 mg Oral BID   spironolactone  12.5 mg Oral Daily   torsemide  40 mg Oral Daily   Continuous Infusions: PRN Meds: acetaminophen **OR** acetaminophen, chlorpheniramine-HYDROcodone, magnesium hydroxide, morphine injection, nitroGLYCERIN, ondansetron **OR** ondansetron (ZOFRAN) IV, traZODone  Time spent: 55 minutes  Author: Gillis Santa. MD [facility] 12/27/2023 5:09 PM  To reach On-call, see care teams to locate the attending and reach out to them via [email or web address]. If 7PM-7AM, please contact night-coverage If you still have difficulty reaching the attending provider, please page the [facility] (Director on Call) for [facility] on amion for assistance.

## 2023-12-28 DIAGNOSIS — R079 Chest pain, unspecified: Secondary | ICD-10-CM | POA: Diagnosis not present

## 2023-12-28 LAB — COMPREHENSIVE METABOLIC PANEL
ALT: 84 U/L — ABNORMAL HIGH (ref 0–44)
AST: 21 U/L (ref 15–41)
Albumin: 3.2 g/dL — ABNORMAL LOW (ref 3.5–5.0)
Alkaline Phosphatase: 47 U/L (ref 38–126)
Anion gap: 9 (ref 5–15)
BUN: 34 mg/dL — ABNORMAL HIGH (ref 8–23)
CO2: 34 mmol/L — ABNORMAL HIGH (ref 22–32)
Calcium: 8.2 mg/dL — ABNORMAL LOW (ref 8.9–10.3)
Chloride: 93 mmol/L — ABNORMAL LOW (ref 98–111)
Creatinine, Ser: 0.81 mg/dL (ref 0.61–1.24)
GFR, Estimated: >60 mL/min (ref >60)
Glucose, Bld: 149 mg/dL — ABNORMAL HIGH (ref 70–99)
Potassium: 4 mmol/L (ref 3.5–5.1)
Sodium: 136 mmol/L (ref 135–145)
Total Bilirubin: 0.9 mg/dL (ref 0.0–1.2)
Total Protein: 6.8 g/dL (ref 6.5–8.1)

## 2023-12-28 LAB — VITAMIN D 25 HYDROXY (VIT D DEFICIENCY, FRACTURES): Vit D, 25-Hydroxy: 22.06 ng/mL — ABNORMAL LOW (ref 30–100)

## 2023-12-28 LAB — CBC WITH DIFFERENTIAL/PLATELET
Abs Immature Granulocytes: 0.07 10*3/uL (ref 0.00–0.07)
Basophils Absolute: 0 10*3/uL (ref 0.0–0.1)
Basophils Relative: 0 %
Eosinophils Absolute: 0 10*3/uL (ref 0.0–0.5)
Eosinophils Relative: 0 %
HCT: 47.5 % (ref 39.0–52.0)
Hemoglobin: 16.1 g/dL (ref 13.0–17.0)
Immature Granulocytes: 1 %
Lymphocytes Relative: 5 %
Lymphs Abs: 0.5 10*3/uL — ABNORMAL LOW (ref 0.7–4.0)
MCH: 31.6 pg (ref 26.0–34.0)
MCHC: 33.9 g/dL (ref 30.0–36.0)
MCV: 93.3 fL (ref 80.0–100.0)
Monocytes Absolute: 0.2 10*3/uL (ref 0.1–1.0)
Monocytes Relative: 1 %
Neutro Abs: 10.3 10*3/uL — ABNORMAL HIGH (ref 1.7–7.7)
Neutrophils Relative %: 93 %
Platelets: 195 10*3/uL (ref 150–400)
RBC: 5.09 MIL/uL (ref 4.22–5.81)
RDW: 13.6 % (ref 11.5–15.5)
WBC: 11.1 10*3/uL — ABNORMAL HIGH (ref 4.0–10.5)
nRBC: 0 % (ref 0.0–0.2)

## 2023-12-28 LAB — VITAMIN B12: Vitamin B-12: 204 pg/mL (ref 180–914)

## 2023-12-28 LAB — MAGNESIUM: Magnesium: 2.6 mg/dL — ABNORMAL HIGH (ref 1.7–2.4)

## 2023-12-28 LAB — PHOSPHORUS: Phosphorus: 3.8 mg/dL (ref 2.5–4.6)

## 2023-12-28 NOTE — Plan of Care (Signed)
Problem: Education: Goal: Knowledge of General Education information will improve Description: Including pain rating scale, medication(s)/side effects and non-pharmacologic comfort measures Outcome: Progressing   Problem: Health Behavior/Discharge Planning: Goal: Ability to manage health-related needs will improve Outcome: Progressing   Problem: Clinical Measurements: Goal: Will remain free from infection Outcome: Progressing Goal: Diagnostic test results will improve Outcome: Progressing Goal: Respiratory complications will improve Outcome: Progressing Goal: Cardiovascular complication will be avoided Outcome: Progressing   Problem: Activity: Goal: Risk for activity intolerance will decrease Outcome: Progressing   Problem: Nutrition: Goal: Adequate nutrition will be maintained Outcome: Progressing

## 2023-12-28 NOTE — Plan of Care (Signed)
  Problem: Education: Goal: Knowledge of General Education information will improve Description: Including pain rating scale, medication(s)/side effects and non-pharmacologic comfort measures Outcome: Progressing   Problem: Activity: Goal: Risk for activity intolerance will decrease Outcome: Progressing   Problem: Safety: Goal: Ability to remain free from injury will improve Outcome: Progressing   Problem: Respiratory: Goal: Ability to maintain a clear airway will improve Outcome: Progressing Goal: Levels of oxygenation will improve Outcome: Progressing

## 2023-12-28 NOTE — Progress Notes (Signed)
[facility] CLINIC CARDIOLOGY PROGRESS NOTE       Patient ID: Kenneth Benson MRN: [id number] DOB/AGE: 62-May-1962 83 y.o.  Admit date: 12/25/2023 Referring Physician Dr. Valente David Primary Physician Pcp, No  Primary Cardiologist Dr. Clotilde Dieter Reason for Consultation chest pain  HPI: Kenneth Benson is a 63 y.o. 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 Cardiology was consulted for further evaluation.   Interval history: -Patient reports he is feeling well today, no complaints at the time of my evaluation.  -Denies any recurrence of chest pain symptoms. States SOB/wheezing is stable, he received a breathing treatment prior to my evaluation.  -HR and BP remain controlled.  Review of systems complete and found to be negative unless listed above    Past Medical History:  Diagnosis Date   A-fib (HCC) 02/06/2022   AKI (acute kidney injury) (HCC) 12/17/2022   Arrhythmia    atrial fibrillation   Asthma    CHF (congestive heart failure) (HCC)    COPD (chronic obstructive pulmonary disease) (HCC)    Hyperkalemia 12/17/2022   Hypokalemia 04/13/2023   Myocardial injury 04/13/2023   Tobacco abuse     History reviewed. No pertinent surgical history.  Medications Prior to Admission  Medication Sig Dispense Refill Last Dose/Taking   albuterol (PROVENTIL) (2.5 MG/3ML) 0.083% nebulizer solution Take 3 mLs (2.5 mg total) by nebulization every 6 (six) hours as needed for wheezing or shortness of breath. 360 mL 1 Taking As Needed   albuterol (VENTOLIN HFA) 108 (90 Base) MCG/ACT inhaler Inhale 2 puffs into the lungs every 6 (six) hours as needed for wheezing or shortness of breath. 18 g 1 Taking As Needed   predniSONE (DELTASONE) 20 MG tablet Take 2 tablets (40 mg total) by mouth daily with breakfast for 3 days, THEN 1 tablet (20 mg total) daily with breakfast for 3  days. 9 tablet 0 12/25/2023   apixaban (ELIQUIS) 5 MG TABS tablet Take 1 tablet (5 mg total) by mouth 2 (two) times daily. (Patient not taking: Reported on 12/26/2023) 60 tablet 3 Not Taking   arformoterol (BROVANA) 15 MCG/2ML NEBU Take 2 mLs (15 mcg total) by nebulization 2 (two) times daily. (Patient not taking: Reported on 12/26/2023) 120 mL 2 Not Taking   digoxin (LANOXIN) 0.125 MG tablet Take 1 tablet (0.125 mg total) by mouth daily. (Patient not taking: Reported on 12/26/2023) 30 tablet 0 Not Taking   diltiazem (CARDIZEM CD) 180 MG 24 hr capsule Take 1 capsule (180 mg total) by mouth daily. (Patient not taking: Reported on 12/26/2023) 30 capsule 0 Not Taking   JARDIANCE 10 MG TABS tablet Take 1 tablet (10 mg total) by mouth daily. (Patient not taking: Reported on 12/26/2023) 30 tablet 3 Not Taking   metoprolol tartrate (LOPRESSOR) 50 MG tablet Take 1 tablet (50 mg total) by mouth 2 (two) times daily. (Patient not taking: Reported on 12/26/2023) 60 tablet 0 Not Taking   spironolactone (ALDACTONE) 25 MG tablet Take 0.5 tablets (12.5 mg total) by mouth daily. Hold this medication until you see your cardiologist (Patient not taking: Reported on 12/26/2023)   Not Taking   torsemide (DEMADEX) 20 MG tablet Take 2 tablets (40 mg total) by mouth daily. Hold this medication until you see your cardiologist (Patient not taking: Reported on 12/26/2023)   Not Taking   Social History   Socioeconomic History  Marital status: Single    Spouse name: Not on file   Number of children: Not on file   Years of education: Not on file   Highest education level: Not on file  Occupational History   Not on file  Tobacco Use   Smoking status: Every Day    Current packs/day: 1.50    Average packs/day: 1.5 packs/day for 27.0 years (40.5 ttl pk-yrs)    Types: Cigarettes   Smokeless tobacco: Not on file   Tobacco comments:    0.5PPD at the most.  trying to quit.  05/11/2023 hfb  Vaping Use   Vaping status: Never Used   Substance and Sexual Activity   Alcohol use: No   Drug use: No   Sexual activity: Not Currently  Other Topics Concern   Not on file  Social History Narrative   Not on file   Social Drivers of Health   Financial Resource Strain: Not on file  Food Insecurity: Food Insecurity Present (12/27/2023)   Hunger Vital Sign    Worried About Running Out of Food in the Last Year: Sometimes true    Ran Out of Food in the Last Year: Never true  Transportation Needs: Unmet Transportation Needs (12/27/2023)   PRAPARE - Administrator, Civil Service (Medical): Yes    Lack of Transportation (Non-Medical): No  Physical Activity: Not on file  Stress: Not on file  Social Connections: Moderately Isolated (12/27/2023)   Social Connection and Isolation Panel [NHANES]    Frequency of Communication with Friends and Family: Twice a week    Frequency of Social Gatherings with Friends and Family: Twice a week    Attends Religious Services: Never    Database administrator or Organizations: No    Attends Banker Meetings: Never    Marital Status: Living with partner  Intimate Partner Violence: Not At Risk (12/27/2023)   Humiliation, Afraid, Rape, and Kick questionnaire    Fear of Current or Ex-Partner: No    Emotionally Abused: No    Physically Abused: No    Sexually Abused: No    Family History  Problem Relation Age of Onset   Heart disease Mother    Atrial fibrillation Father      Vitals:   12/28/23 0011 12/28/23 0351 12/28/23 0733 12/28/23 0752  BP: 114/77 105/63 115/77   Pulse: 94 71 71   Resp: 20 20 18    Temp: 97.7 F (36.5 C) 98 F (36.7 C) 98.3 F (36.8 C)   TempSrc:   Oral   SpO2: 94% 95% 92% 94%  Weight:      Height:        PHYSICAL EXAM General: Chronically ill-appearing male, well nourished, in no acute distress. HEENT: Normocephalic and atraumatic. Neck: No JVD.  Lungs: Normal respiratory effort on 2L [place]. + wheezing.  Heart: Irregularly irregular,  controlled rate. Normal S1 and S2 without gallops or murmurs.  Abdomen: Non-distended appearing.  Msk: Normal strength and tone for age. Extremities: Warm and well perfused. No clubbing, cyanosis. No edema.  Neuro: Alert and oriented X 3. Psych: Answers questions appropriately.   Labs: Basic Metabolic Panel: Recent Labs    12/27/23 0440 12/28/23 0554  NA 139 136  K 4.3 4.0  CL 98 93*  CO2 33* 34*  GLUCOSE 131* 149*  BUN 32* 34*  CREATININE 0.96 0.81  CALCIUM 7.7* 8.2*  MG 2.3 2.6*  PHOS 4.7* 3.8   Liver Function Tests: Recent Labs  12/27/23 0440 12/28/23 0554  AST 21 21  ALT 90* 84*  ALKPHOS 45 47  BILITOT 0.6 0.9  PROT 6.3* 6.8  ALBUMIN 3.1* 3.2*   Recent Labs    12/25/23 2311  LIPASE 31   CBC: Recent Labs    12/27/23 0440 12/28/23 0554  WBC 14.6* 11.1*  NEUTROABS 12.8* 10.3*  HGB 15.6 16.1  HCT 47.9 47.5  MCV 94.7 93.3  PLT 198 195   Cardiac Enzymes: Recent Labs    12/25/23 2311 12/26/23 0518  TROPONINIHS 9 9   BNP: No results for input(s): "BNP" in the last 72 hours. D-Dimer: No results for input(s): "DDIMER" in the last 72 hours. Hemoglobin A1C: No results for input(s): "HGBA1C" in the last 72 hours. Fasting Lipid Panel: No results for input(s): "CHOL", "HDL", "LDLCALC", "TRIG", "CHOLHDL", "LDLDIRECT" in the last 72 hours. Thyroid Function Tests: No results for input(s): "TSH", "T4TOTAL", "T3FREE", "THYROIDAB" in the last 72 hours.  Invalid input(s): "FREET3" Anemia Panel: Recent Labs    12/27/23 0440  FOLATE 10.8     Radiology: CT Renal Stone Study Result Date: 12/26/2023 CLINICAL DATA:  Abdominal and flank pain. EXAM: CT ABDOMEN AND PELVIS WITHOUT CONTRAST TECHNIQUE: Multidetector CT imaging of the abdomen and pelvis was performed following the standard protocol without IV contrast. RADIATION DOSE REDUCTION: This exam was performed according to the departmental dose-optimization program which includes automated exposure control,  adjustment of the mA and/or kV according to patient size and/or use of iterative reconstruction technique. COMPARISON:  CTA abdomen and pelvis 12/16/2022 FINDINGS: Lower chest: No acute abnormality. Hepatobiliary: Small gallstone is present. There is no biliary ductal dilatation. T fat along the falciform ligament he liver is within normal limits. There is likely focal fatty infiltration along the falciform ligament. Pancreas: Unremarkable. No pancreatic ductal dilatation or surrounding inflammatory changes. Spleen: Normal in size without focal abnormality. Adrenals/Urinary Tract: There is a cyst in the inferior pole the right kidney measuring 4.3 cm. Otherwise, the kidneys, adrenal glands and bladder are within normal limits. Stomach/Bowel: Stomach is within normal limits. Appendix appears normal. No evidence of bowel wall thickening, distention, or inflammatory changes. Vascular/Lymphatic: No significant vascular findings are present. No enlarged abdominal or pelvic lymph nodes. Reproductive: Prostate is unremarkable. Other: There are large bilateral inguinal hernias. The left inguinal hernia contains nondilated bowel, unchanged. There is no ascites or free air. Musculoskeletal: Chronic compression deformity of L3 appears unchanged. Degenerative changes affect the hips. IMPRESSION: 1. No acute localizing process in the abdomen or pelvis. 2. Cholelithiasis. 3. Stable large bilateral inguinal hernias. The left inguinal hernia contains nondilated bowel. 4. Right Bosniak I benign renal cyst measuring 4.3 cm. No follow-up imaging is recommended. JACR 2018 Feb; 264-273, Management of the Incidental Renal Mass on CT, [employer] 2021; 814-848, Bosniak Classification of Cystic Renal Masses, Version 2019. Electronically Signed   By: Darliss Cheney M.D.   On: 12/26/2023 01:10   DG Chest Port 1 View Result Date: 12/25/2023 CLINICAL DATA:  Chest pain. EXAM: PORTABLE CHEST 1 VIEW COMPARISON:  Radiograph 4 days ago  12/21/2023, CT 07/02/2023 FINDINGS: Stable cardiomegaly. Unchanged mediastinal contours. There may be a trace left pleural effusion. Mild bibasilar atelectasis without confluent airspace disease. No pulmonary edema. No pneumothorax. Stable osseous structures. IMPRESSION: 1. Chronic cardiomegaly. 2. Mild bibasilar atelectasis with possible small left pleural effusion. Electronically Signed   By: Narda Rutherford M.D.   On: 12/25/2023 23:32   DG Chest 2 View Result Date: 12/21/2023 CLINICAL DATA:  Shortness of breath.  EXAM: CHEST - 2 VIEW COMPARISON:  Chest radiograph dated 09/01/2023. FINDINGS: The heart size and mediastinal contours are within normal limits. Both lungs are clear. The visualized skeletal structures are unremarkable. IMPRESSION: No active cardiopulmonary disease. Electronically Signed   By: Elgie Collard M.D.   On: 12/21/2023 15:41    ECHO 11/2022: 1. Left ventricular ejection fraction, by estimation, is 45 to 50%. The left ventricle has mildly decreased function. The left ventricle has no regional wall motion abnormalities. The left ventricular internal cavity size was mildly dilated. Left ventricular diastolic parameters were normal.   2. Right ventricular systolic function is normal. The right ventricular size is normal. There is mildly elevated pulmonary artery systolic pressure.   3. The mitral valve is normal in structure. Moderate mitral valve  regurgitation. No evidence of mitral stenosis.   4. Tricuspid valve regurgitation is moderate.   5. The aortic valve is normal in structure. Aortic valve regurgitation is  not visualized. No aortic stenosis is present.   6. The inferior vena cava is normal in size with greater than 50%  respiratory variability, suggesting right atrial pressure of 3 mmHg.   TELEMETRY reviewed by me 12/28/2023: atrial fibrillation rate 80s  EKG reviewed by me: atrial fibrillation rate 97 bpm  Data reviewed by me 12/28/2023: last 24h vitals tele labs  imaging I/O hospitalist progress note  Principal Problem:   Chest pain Active Problems:   Chronic combined systolic and diastolic heart failure (HCC)   Paroxysmal atrial fibrillation (HCC)   COPD with acute exacerbation (HCC)   Atrial fibrillation with RVR (HCC)    ASSESSMENT AND PLAN:  Kenneth Benson is a 63 y.o. 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 Cardiology was consulted for further evaluation.   # Chest pain # Atrial fibrillation RVR # Paroxysmal atrial fibrillation # COPD exacerbation Patient presented with chest pain, in AF RVR with rates up to the 140s on admission. Resolution of CP symptoms with IV diltiazem. BP borderline low.  -Continue amiodarone 400 mg twice daily for rhythm control.  This will likely not be a long term option given his significant COPD.  -Continue po diltiazem 180 mg daily and metoprolol tartrate 50 mg twice daily as BP allows. Continue digoxin 0.125 mg daily.  -Continue eliquis 5 mg twice daily.  -Further management of COPD exacerbation as per primary. -Suspect chest pain symptoms secondary to A-fib RVR given symptoms resolved with IV diltiazem and improved rate control.  No plan for further cardiac diagnostics at this time.  # Chronic HFmrEF # Hypotension Patient with hx of heart failure with mildly reduced EF 45-50% on echo 11/2022.  -Continue spironolactone 12.5 mg daily, jardiance 10 mg daily, torsemide 40 mg daily.   Cardiology will sign off. Please haiku with questions or re-engage if needed.    This patient's plan of care was discussed and created with Dr. Darrold Junker and he is in agreement.  Signed: Gale Journey, PA-C  12/28/2023, 9:39 AM [facility]

## 2023-12-28 NOTE — Progress Notes (Signed)
[facility] Progress Note  Patient: Kenneth Benson    MVH:[id number]  DOA: 12/25/2023     Date of Service: the patient was seen and examined on 12/28/2023  Chief Complaint  Patient presents with   Chest Pain   Brief hospital course: Kenneth Benson is a 63 y.o. male with medical history significant for paroxysmal atrial fibrillation, asthma, CHF, COPD, and coronary artery disease, who presented to the ER with acute onset of midsternal chest pain with radiation to her back as well as recent dyspnea with associated palpitations, cough which has been mainly dry as well as wheezing.  He denied any fever or chills.  She denied any abdominal pain or melena or bright red blood per rectum.  He quit smoking 7 days ago.  No dysuria, oliguria or hematuria or flank pain.   ED Course: When he came to the ER, temperature was 99.7, heart rate was 114 then 127 in atrial fibrillation with respiratory 12 with 30 and otherwise normal vital signs.  Labs revealed a BUN of 28 with low glucose of 146 and total protein 8.4 AST 56 ALT 141 and otherwise normal vital signs.    EKG as reviewed by me : EKG showed atrial fibrillation with controlled response of 97 with probable LVH with secondary repolarization. Imaging: Portable chest x-ray showed chronic cardiomegaly and mild bibasilar atelectasis with possible small left pleural effusion.   CT renal stone study: 1. No acute localizing process in the abdomen or pelvis. 2. Cholelithiasis. 3. Stable large bilateral inguinal hernias. The left inguinal hernia contains nondilated bowl. 4. Right Bosniak I benign renal cyst measuring 4.3 cm. No follow-up imaging is recommended.   The patient was given 10 mg of IV Cardizem for initial rapid ventricular sponsor with atrial fibrillation on monitor, DuoNeb, 125 mg IV Solu-Medrol, 4 mg of IV morphine sulfate and 4 mg of IV Zofran as well as 500 mL IV normal saline bolus and 4 baby aspirin.  He will be admitted to a progressive unit  observation bed for further evaluation and management.   Assessment and Plan:  # COPD with acute exacerbation Patient was recently admitted due to RSV on 12/21/2023 Solu-Medrol 80 mg every 12 hourly x 2 days followed by half prednisone 40 mg p.o. daily for 3 days. Started Breo Ellipta inhaler, DuoNeb every 6 hourly, Mucinex 600 mg p.o. twice daily Tussionex as needed for cough   # Paroxysmal atrial fibrillation  S/p Cardizem IV bolus, responded well. Continue Eliquis 5 mg p.o. twice daily Continue Cardizem CD1 80 mg p.o. daily, metoprolol 50 mg p.o. twice daily, digoxin 0.125 mg p.o. daily Started amiodarone 400 mg p.o. twice daily for 10 days Continue to follow cardiology for further recommendation  # Chest pain, resolved Troponin x 2 negative Continue aspirin 81 mg p.o. daily, nitro as needed Seen by cardiology, recommended chest pain secondary to A-fib with RVR and COPD.  No plan for further cardiac testing at this time     # Chronic combined systolic and diastolic heart failure  LVEF 40 to 45% -  continue digoxin, Jardiance, Lopressor, Aldactone and Demadex.   Body mass index is 37.97 kg/m.  Interventions:  Diet: Regular diet (patient does not want cardiac diet.) DVT Prophylaxis: Subcutaneous Lovenox   Advance goals of care discussion: Full code  Family Communication: family was Not present at bedside, at the time of interview.  The pt provided permission to discuss medical plan with the family. Opportunity was given to ask question  and all questions were answered satisfactorily.   Disposition:  Pt is from Home, admitted with Resp failure, A. rvr, still has SOB, which precludes a safe discharge. Discharge to Home, when stable and cleared by cardiology, may need 1-2 more days to improve.  Subjective: No significant events overnight, patient still having significant shortness of breath, requiring total oxygen evaluation cannula. Denies any chest pain palpitation, no  nasal active issues.  Physical Exam: General: NAD, lying comfortably Appear in no distress, affect appropriate Eyes: PERRLA ENT: Oral Mucosa Clear, moist  Neck: no JVD,  Cardiovascular: S1 and S2 Present, no Murmur, irregular rhythm Respiratory: Equal air entry bilaterally, mild crackles and still significant wheezes Abdomen: Bowel Sound present, Soft and no tenderness,  Skin: no rashes Extremities: no Pedal edema, no calf tenderness Neurologic: without any new focal findings Gait not checked due to patient safety concerns  Vitals:   12/28/23 0351 12/28/23 0733 12/28/23 0752 12/28/23 1120  BP: 105/63 115/77    Pulse: 71 71    Resp: 20 18    Temp: 98 F (36.7 C) 98.3 F (36.8 C)    TempSrc:  Oral    SpO2: 95% 92% 94% 93%  Weight:      Height:        Intake/Output Summary (Last 24 hours) at 12/28/2023 1500 Last data filed at 12/28/2023 1449 Gross per 24 hour  Intake 1160 ml  Output 4100 ml  Net -2940 ml   Filed Weights   12/27/23 1003  Weight: 127 kg    Data Reviewed: I have personally reviewed and interpreted daily labs, tele strips, imagings as discussed above. I reviewed all nursing notes, pharmacy notes, vitals, pertinent old records I have discussed plan of care as described above with RN and patient/family.  CBC: Recent Labs  Lab 12/22/23 0642 12/25/23 2311 12/26/23 0518 12/27/23 0440 12/28/23 0554  WBC 5.0 8.4 6.4 14.6* 11.1*  NEUTROABS  --   --   --  12.8* 10.3*  HGB 15.8 17.3* 16.6 15.6 16.1  HCT 49.4 53.5* 52.7* 47.9 47.5  MCV 98.4 96.1 98.1 94.7 93.3  PLT 164 199 183 198 195   Basic Metabolic Panel: Recent Labs  Lab 12/22/23 0642 12/25/23 2311 12/26/23 0518 12/27/23 0440 12/28/23 0554  NA 140 140 141 139 136  K 4.6 4.1 5.4* 4.3 4.0  CL 102 103 104 98 93*  CO2 29 27 31  33* 34*  GLUCOSE 140* 146* 151* 131* 149*  BUN 10 28* 27* 32* 34*  CREATININE 0.85 0.87 0.85 0.96 0.81  CALCIUM 8.3* 8.4* 7.8* 7.7* 8.2*  MG  --   --   --  2.3 2.6*   PHOS  --   --   --  4.7* 3.8    Studies: No results found.  Scheduled Meds:  amiodarone  400 mg Oral BID   apixaban  5 mg Oral BID   aspirin EC  81 mg Oral Daily   digoxin  0.125 mg Oral Daily   diltiazem  180 mg Oral Daily   empagliflozin  10 mg Oral Daily   fluticasone furoate-vilanterol  1 puff Inhalation Daily   guaiFENesin  600 mg Oral BID   ipratropium-albuterol  3 mL Nebulization QID   methylPREDNISolone (SOLU-MEDROL) injection  80 mg Intravenous Q12H   Followed by   Melene Muller ON 12/30/2023] predniSONE  40 mg Oral Q breakfast   metoprolol tartrate  50 mg Oral BID   spironolactone  12.5 mg Oral Daily   torsemide  40 mg Oral Daily   Continuous Infusions: PRN Meds: acetaminophen **OR** acetaminophen, chlorpheniramine-HYDROcodone, magnesium hydroxide, morphine injection, nitroGLYCERIN, ondansetron **OR** ondansetron (ZOFRAN) IV, traZODone  Time spent: 40 minutes  Author: Gillis Santa. MD [facility] 12/28/2023 3:00 PM  To reach On-call, see care teams to locate the attending and reach out to them via [email or web address]. If 7PM-7AM, please contact night-coverage If you still have difficulty reaching the attending provider, please page the [facility] (Director on Call) for [facility] on amion for assistance.

## 2023-12-29 DIAGNOSIS — R079 Chest pain, unspecified: Secondary | ICD-10-CM | POA: Diagnosis not present

## 2023-12-29 LAB — CBC WITH DIFFERENTIAL/PLATELET
Abs Immature Granulocytes: 0.08 10*3/uL — ABNORMAL HIGH (ref 0.00–0.07)
Basophils Absolute: 0 10*3/uL (ref 0.0–0.1)
Basophils Relative: 0 %
Eosinophils Absolute: 0 10*3/uL (ref 0.0–0.5)
Eosinophils Relative: 0 %
HCT: 49.7 % (ref 39.0–52.0)
Hemoglobin: 16.5 g/dL (ref 13.0–17.0)
Immature Granulocytes: 1 %
Lymphocytes Relative: 3 %
Lymphs Abs: 0.4 10*3/uL — ABNORMAL LOW (ref 0.7–4.0)
MCH: 31.1 pg (ref 26.0–34.0)
MCHC: 33.2 g/dL (ref 30.0–36.0)
MCV: 93.6 fL (ref 80.0–100.0)
Monocytes Absolute: 0.4 10*3/uL (ref 0.1–1.0)
Monocytes Relative: 3 %
Neutro Abs: 12 10*3/uL — ABNORMAL HIGH (ref 1.7–7.7)
Neutrophils Relative %: 93 %
Platelets: 218 10*3/uL (ref 150–400)
RBC: 5.31 MIL/uL (ref 4.22–5.81)
RDW: 13.6 % (ref 11.5–15.5)
WBC: 13 10*3/uL — ABNORMAL HIGH (ref 4.0–10.5)
nRBC: 0 % (ref 0.0–0.2)

## 2023-12-29 LAB — COMPREHENSIVE METABOLIC PANEL
ALT: 79 U/L — ABNORMAL HIGH (ref 0–44)
AST: 26 U/L (ref 15–41)
Albumin: 3.3 g/dL — ABNORMAL LOW (ref 3.5–5.0)
Alkaline Phosphatase: 48 U/L (ref 38–126)
Anion gap: 12 (ref 5–15)
BUN: 40 mg/dL — ABNORMAL HIGH (ref 8–23)
CO2: 33 mmol/L — ABNORMAL HIGH (ref 22–32)
Calcium: 8.3 mg/dL — ABNORMAL LOW (ref 8.9–10.3)
Chloride: 95 mmol/L — ABNORMAL LOW (ref 98–111)
Creatinine, Ser: 0.97 mg/dL (ref 0.61–1.24)
GFR, Estimated: >60 mL/min (ref >60)
Glucose, Bld: 160 mg/dL — ABNORMAL HIGH (ref 70–99)
Potassium: 3.7 mmol/L (ref 3.5–5.1)
Sodium: 140 mmol/L (ref 135–145)
Total Bilirubin: 0.7 mg/dL (ref 0.0–1.2)
Total Protein: 6.6 g/dL (ref 6.5–8.1)

## 2023-12-29 LAB — MAGNESIUM: Magnesium: 2.6 mg/dL — ABNORMAL HIGH (ref 1.7–2.4)

## 2023-12-29 LAB — PHOSPHORUS: Phosphorus: 2.7 mg/dL (ref 2.5–4.6)

## 2023-12-29 MED ORDER — IPRATROPIUM-ALBUTEROL 0.5-2.5 (3) MG/3ML IN SOLN
3.0000 mL | Freq: Two times a day (BID) | RESPIRATORY_TRACT | Status: DC
  Administered 2023-12-29 – 2023-12-30 (×2): 3 mL via RESPIRATORY_TRACT
  Filled 2023-12-29 (×2): qty 3

## 2023-12-29 MED ORDER — VITAMIN B-12 1000 MCG PO TABS: 1000.0000 ug | ORAL_TABLET | Freq: Every day | ORAL | Status: DC

## 2023-12-29 MED ORDER — VITAMIN D (ERGOCALCIFEROL) 1.25 MG (50000 UNIT) PO CAPS
50000.0000 [IU] | ORAL_CAPSULE | ORAL | Status: DC
  Administered 2023-12-29: 50000 [IU] via ORAL
  Filled 2023-12-29: qty 1

## 2023-12-29 MED ORDER — CYANOCOBALAMIN 1000 MCG/ML IJ SOLN
1000.0000 ug | Freq: Every day | INTRAMUSCULAR | Status: DC
  Administered 2023-12-29 – 2024-01-01 (×4): 1000 ug via INTRAMUSCULAR
  Filled 2023-12-29 (×4): qty 1

## 2023-12-29 NOTE — Progress Notes (Addendum)
[facility] Progress Note  Patient: Kenneth Benson    ZOX:[id number]  DOA: 12/25/2023     Date of Service: the patient was seen and examined on 12/29/2023  Chief Complaint  Patient presents with   Chest Pain   Brief hospital course: Kenneth Benson is a 63 y.o. male with medical history significant for paroxysmal atrial fibrillation, asthma, CHF, COPD, and coronary artery disease, who presented to the ER with acute onset of midsternal chest pain with radiation to her back as well as recent dyspnea with associated palpitations, cough which has been mainly dry as well as wheezing.  He denied any fever or chills.  She denied any abdominal pain or melena or bright red blood per rectum.  He quit smoking 7 days ago.  No dysuria, oliguria or hematuria or flank pain.   ED Course: When he came to the ER, temperature was 99.7, heart rate was 114 then 127 in atrial fibrillation with respiratory 12 with 30 and otherwise normal vital signs.  Labs revealed a BUN of 28 with low glucose of 146 and total protein 8.4 AST 56 ALT 141 and otherwise normal vital signs.    EKG as reviewed by me : EKG showed atrial fibrillation with controlled response of 97 with probable LVH with secondary repolarization. Imaging: Portable chest x-ray showed chronic cardiomegaly and mild bibasilar atelectasis with possible small left pleural effusion.   CT renal stone study: 1. No acute localizing process in the abdomen or pelvis. 2. Cholelithiasis. 3. Stable large bilateral inguinal hernias. The left inguinal hernia contains nondilated bowl. 4. Right Bosniak I benign renal cyst measuring 4.3 cm. No follow-up imaging is recommended.   The patient was given 10 mg of IV Cardizem for initial rapid ventricular sponsor with atrial fibrillation on monitor, DuoNeb, 125 mg IV Solu-Medrol, 4 mg of IV morphine sulfate and 4 mg of IV Zofran as well as 500 mL IV normal saline bolus and 4 baby aspirin.  He will be admitted to a progressive unit  observation bed for further evaluation and management.   Assessment and Plan:  # COPD with acute exacerbation Patient was recently admitted due to RSV on 12/21/2023 Solu-Medrol 80 mg every 12 hourly x 2 days followed by half prednisone 40 mg p.o. daily for 3 days. Started Breo Ellipta inhaler, DuoNeb every 6 hourly, Mucinex 600 mg p.o. twice daily Tussionex as needed for cough   # Paroxysmal atrial fibrillation  S/p Cardizem IV bolus, responded well. Continue Eliquis 5 mg p.o. twice daily Continue Cardizem CD1 80 mg p.o. daily, metoprolol 50 mg p.o. twice daily, digoxin 0.125 mg p.o. daily Started amiodarone 400 mg p.o. twice daily for 10 days Continue to follow cardiology for further recommendation  # Chest pain, resolved Troponin x 2 negative Continue aspirin 81 mg p.o. daily, nitro as needed Seen by cardiology, recommended chest pain secondary to A-fib with RVR and COPD.  No plan for further cardiac testing at this time     # Chronic combined systolic and diastolic heart failure  LVEF 40 to 45% -  continue digoxin, Jardiance, Lopressor, Aldactone and Demadex.   Body mass index is 37.97 kg/m.  Interventions:  Diet: Regular diet (patient does not want cardiac diet.) DVT Prophylaxis: Subcutaneous Lovenox   Advance goals of care discussion: Full code  Family Communication: family was Not present at bedside, at the time of interview.  The pt provided permission to discuss medical plan with the family. Opportunity was given to ask question  and all questions were answered satisfactorily.   Disposition:  Pt is from Home, admitted with Resp failure, A. rvr, still has SOB, which precludes a safe discharge. Discharge to Home, when stable and cleared by cardiology. Gradually getting better, most likely discharge tomorrow a.m.   Subjective: No significant events overnight, still has cough which is dry, not much phlegm coming.  Breathing is getting better, no nasal  complaints.   Physical Exam: General: NAD, lying comfortably Appear in no distress, affect appropriate Eyes: PERRLA ENT: Oral Mucosa Clear, moist  Neck: no JVD,  Cardiovascular: S1 and S2 Present, no Murmur, irregular rhythm Respiratory: Equal air entry bilaterally, mild crackles and Mild wheezes, significant improvement noticed today Abdomen: Bowel Sound present, Soft and no tenderness,  Skin: no rashes Extremities: no Pedal edema, no calf tenderness Neurologic: without any new focal findings Gait not checked due to patient safety concerns  Vitals:   12/28/23 1625 12/28/23 2130 12/28/23 2304 12/29/23 0727  BP:   112/63 116/81  Pulse:   66 81  Resp:   18 16  Temp:   97.7 F (36.5 C) 97.7 F (36.5 C)  TempSrc:      SpO2: 94% 95% 93% 95%  Weight:      Height:        Intake/Output Summary (Last 24 hours) at 12/29/2023 1400 Last data filed at 12/29/2023 1008 Gross per 24 hour  Intake 1320 ml  Output 3125 ml  Net -1805 ml   Filed Weights   12/27/23 1003  Weight: 127 kg    Data Reviewed: I have personally reviewed and interpreted daily labs, tele strips, imagings as discussed above. I reviewed all nursing notes, pharmacy notes, vitals, pertinent old records I have discussed plan of care as described above with RN and patient/family.  CBC: Recent Labs  Lab 12/25/23 2311 12/26/23 0518 12/27/23 0440 12/28/23 0554 12/29/23 0254  WBC 8.4 6.4 14.6* 11.1* 13.0*  NEUTROABS  --   --  12.8* 10.3* 12.0*  HGB 17.3* 16.6 15.6 16.1 16.5  HCT 53.5* 52.7* 47.9 47.5 49.7  MCV 96.1 98.1 94.7 93.3 93.6  PLT 199 183 198 195 218   Basic Metabolic Panel: Recent Labs  Lab 12/25/23 2311 12/26/23 0518 12/27/23 0440 12/28/23 0554 12/29/23 0254  NA 140 141 139 136 140  K 4.1 5.4* 4.3 4.0 3.7  CL 103 104 98 93* 95*  CO2 27 31 33* 34* 33*  GLUCOSE 146* 151* 131* 149* 160*  BUN 28* 27* 32* 34* 40*  CREATININE 0.87 0.85 0.96 0.81 0.97  CALCIUM 8.4* 7.8* 7.7* 8.2* 8.3*  MG  --    --  2.3 2.6* 2.6*  PHOS  --   --  4.7* 3.8 2.7    Studies: No results found.  Scheduled Meds:  amiodarone  400 mg Oral BID   apixaban  5 mg Oral BID   aspirin EC  81 mg Oral Daily   cyanocobalamin  1,000 mcg Intramuscular Daily   Followed by   Melene Muller ON 01/05/2024] vitamin B-12  1,000 mcg Oral Daily   digoxin  0.125 mg Oral Daily   diltiazem  180 mg Oral Daily   empagliflozin  10 mg Oral Daily   fluticasone furoate-vilanterol  1 puff Inhalation Daily   guaiFENesin  600 mg Oral BID   ipratropium-albuterol  3 mL Nebulization BID   metoprolol tartrate  50 mg Oral BID   [START ON 12/30/2023] predniSONE  40 mg Oral Q breakfast   spironolactone  12.5  mg Oral Daily   torsemide  40 mg Oral Daily   Vitamin D (Ergocalciferol)  50,000 Units Oral Q7 days   Continuous Infusions: PRN Meds: acetaminophen **OR** acetaminophen, chlorpheniramine-HYDROcodone, magnesium hydroxide, morphine injection, nitroGLYCERIN, ondansetron **OR** ondansetron (ZOFRAN) IV, traZODone  Time spent: 40 minutes  Author: Gillis Santa. MD [facility] 12/29/2023 2:00 PM  To reach On-call, see care teams to locate the attending and reach out to them via [email or web address]. If 7PM-7AM, please contact night-coverage If you still have difficulty reaching the attending provider, please page the [facility] (Director on Call) for [facility] on amion for assistance.

## 2023-12-29 NOTE — Plan of Care (Signed)

## 2023-12-29 NOTE — Plan of Care (Signed)

## 2023-12-30 DIAGNOSIS — R079 Chest pain, unspecified: Secondary | ICD-10-CM | POA: Diagnosis not present

## 2023-12-30 LAB — COMPREHENSIVE METABOLIC PANEL
ALT: 96 U/L — ABNORMAL HIGH (ref 0–44)
AST: 29 U/L (ref 15–41)
Albumin: 3 g/dL — ABNORMAL LOW (ref 3.5–5.0)
Alkaline Phosphatase: 47 U/L (ref 38–126)
Anion gap: 10 (ref 5–15)
BUN: 42 mg/dL — ABNORMAL HIGH (ref 8–23)
CO2: 34 mmol/L — ABNORMAL HIGH (ref 22–32)
Calcium: 8.1 mg/dL — ABNORMAL LOW (ref 8.9–10.3)
Chloride: 95 mmol/L — ABNORMAL LOW (ref 98–111)
Creatinine, Ser: 0.96 mg/dL (ref 0.61–1.24)
GFR, Estimated: >60 mL/min (ref >60)
Glucose, Bld: 155 mg/dL — ABNORMAL HIGH (ref 70–99)
Potassium: 4.1 mmol/L (ref 3.5–5.1)
Sodium: 139 mmol/L (ref 135–145)
Total Bilirubin: 0.8 mg/dL (ref 0.0–1.2)
Total Protein: 6.2 g/dL — ABNORMAL LOW (ref 6.5–8.1)

## 2023-12-30 LAB — CBC WITH DIFFERENTIAL/PLATELET
Abs Immature Granulocytes: 0.15 10*3/uL — ABNORMAL HIGH (ref 0.00–0.07)
Basophils Absolute: 0 10*3/uL (ref 0.0–0.1)
Basophils Relative: 0 %
Eosinophils Absolute: 0 10*3/uL (ref 0.0–0.5)
Eosinophils Relative: 0 %
HCT: 49.3 % (ref 39.0–52.0)
Hemoglobin: 16.5 g/dL (ref 13.0–17.0)
Immature Granulocytes: 1 %
Lymphocytes Relative: 4 %
Lymphs Abs: 0.6 10*3/uL — ABNORMAL LOW (ref 0.7–4.0)
MCH: 31.1 pg (ref 26.0–34.0)
MCHC: 33.5 g/dL (ref 30.0–36.0)
MCV: 92.8 fL (ref 80.0–100.0)
Monocytes Absolute: 0.8 10*3/uL (ref 0.1–1.0)
Monocytes Relative: 6 %
Neutro Abs: 12.1 10*3/uL — ABNORMAL HIGH (ref 1.7–7.7)
Neutrophils Relative %: 89 %
Platelets: 209 10*3/uL (ref 150–400)
RBC: 5.31 MIL/uL (ref 4.22–5.81)
RDW: 13.5 % (ref 11.5–15.5)
WBC: 13.7 10*3/uL — ABNORMAL HIGH (ref 4.0–10.5)
nRBC: 0 % (ref 0.0–0.2)

## 2023-12-30 LAB — PHOSPHORUS: Phosphorus: 3.1 mg/dL (ref 2.5–4.6)

## 2023-12-30 LAB — MAGNESIUM: Magnesium: 2.6 mg/dL — ABNORMAL HIGH (ref 1.7–2.4)

## 2023-12-30 MED ORDER — IPRATROPIUM-ALBUTEROL 0.5-2.5 (3) MG/3ML IN SOLN: 3.0000 mL | Freq: Four times a day (QID) | RESPIRATORY_TRACT | Status: DC | PRN

## 2023-12-30 NOTE — Progress Notes (Signed)
[facility] Progress Note  Patient: Kenneth Benson    ZOX:[id number]  DOA: 12/25/2023     Date of Service: the patient was seen and examined on 12/30/2023  Chief Complaint  Patient presents with   Chest Pain   Brief hospital course: Kenneth Benson is a 63 y.o. male with medical history significant for paroxysmal atrial fibrillation, asthma, CHF, COPD, and coronary artery disease, who presented to the ER with acute onset of midsternal chest pain with radiation to her back as well as recent dyspnea with associated palpitations, cough which has been mainly dry as well as wheezing.  He denied any fever or chills.  She denied any abdominal pain or melena or bright red blood per rectum.  He quit smoking 7 days ago.  No dysuria, oliguria or hematuria or flank pain.   ED Course: When he came to the ER, temperature was 99.7, heart rate was 114 then 127 in atrial fibrillation with respiratory 12 with 30 and otherwise normal vital signs.  Labs revealed a BUN of 28 with low glucose of 146 and total protein 8.4 AST 56 ALT 141 and otherwise normal vital signs.    EKG as reviewed by me : EKG showed atrial fibrillation with controlled response of 97 with probable LVH with secondary repolarization. Imaging: Portable chest x-ray showed chronic cardiomegaly and mild bibasilar atelectasis with possible small left pleural effusion.   CT renal stone study: 1. No acute localizing process in the abdomen or pelvis. 2. Cholelithiasis. 3. Stable large bilateral inguinal hernias. The left inguinal hernia contains nondilated bowl. 4. Right Bosniak I benign renal cyst measuring 4.3 cm. No follow-up imaging is recommended.   The patient was given 10 mg of IV Cardizem for initial rapid ventricular sponsor with atrial fibrillation on monitor, DuoNeb, 125 mg IV Solu-Medrol, 4 mg of IV morphine sulfate and 4 mg of IV Zofran as well as 500 mL IV normal saline bolus and 4 baby aspirin.  He will be admitted to a progressive unit  observation bed for further evaluation and management.   Assessment and Plan:  # COPD with acute exacerbation Patient was recently admitted due to RSV on 12/21/2023 Solu-Medrol 80 mg every 12 hourly x 2 days followed by half prednisone 40 mg p.o. daily for 3 days. Started Breo Ellipta inhaler, DuoNeb every 6 hourly, Mucinex 600 mg p.o. twice daily Tussionex as needed for cough   # Paroxysmal atrial fibrillation  S/p Cardizem IV bolus, responded well. Continue Eliquis 5 mg p.o. twice daily Continue Cardizem CD1 80 mg p.o. daily, metoprolol 50 mg p.o. twice daily, digoxin 0.125 mg p.o. daily Started amiodarone 400 mg p.o. twice daily for 10 days Continue to follow cardiology for further recommendation  # Chest pain, resolved Troponin x 2 negative Continue aspirin 81 mg p.o. daily, nitro as needed Seen by cardiology, recommended chest pain secondary to A-fib with RVR and COPD.  No plan for further cardiac testing at this time     # Chronic combined systolic and diastolic heart failure  LVEF 40 to 45% -  continue digoxin, Jardiance, Lopressor, Aldactone and Demadex.   Body mass index is 37.97 kg/m.  Interventions:  Diet: Regular diet (patient does not want cardiac diet.) DVT Prophylaxis: Subcutaneous Lovenox   Advance goals of care discussion: Full code  Family Communication: family was Not present at bedside, at the time of interview.  The pt provided permission to discuss medical plan with the family. Opportunity was given to ask question  and all questions were answered satisfactorily.   Disposition:  Pt is from Home, admitted with Resp failure, A. Fib w/rvr, still has hypoxia, requiring 2 L oxygen via nasal cannula, dyspnea on exertion, which precludes a safe discharge. Discharge to Home, but patient mentioned that he does not have home, no safe discharge plan at this time, we will plan to discharge him on Monday Follow TOC for disposition plan   Subjective: No  significant events overnight, breathing is getting better, mild productive cough, no chest pain or palpitation, no any other active issues  Physical Exam: General: NAD, lying comfortably Appear in no distress, affect appropriate Eyes: PERRLA ENT: Oral Mucosa Clear, moist  Neck: no JVD,  Cardiovascular: S1 and S2 Present, no Murmur, irregular rhythm Respiratory: Equal air entry bilaterally, mild crackles and Mild wheezes, significant improvement noticed today Abdomen: Bowel Sound present, Soft and no tenderness,  Skin: no rashes Extremities: no Pedal edema, no calf tenderness Neurologic: without any new focal findings Gait not checked due to patient safety concerns  Vitals:   12/30/23 1102 12/30/23 1105 12/30/23 1130 12/30/23 1140  BP:      Pulse: 84 92 100   Resp:      Temp:      TempSrc:      SpO2: 95% 97% (!) 88% 91%  Weight:      Height:        Intake/Output Summary (Last 24 hours) at 12/30/2023 1221 Last data filed at 12/30/2023 1100 Gross per 24 hour  Intake 1320 ml  Output 5600 ml  Net -4280 ml   Filed Weights   12/27/23 1003  Weight: 127 kg    Data Reviewed: I have personally reviewed and interpreted daily labs, tele strips, imagings as discussed above. I reviewed all nursing notes, pharmacy notes, vitals, pertinent old records I have discussed plan of care as described above with RN and patient/family.  CBC: Recent Labs  Lab 12/26/23 0518 12/27/23 0440 12/28/23 0554 12/29/23 0254 12/30/23 0551  WBC 6.4 14.6* 11.1* 13.0* 13.7*  NEUTROABS  --  12.8* 10.3* 12.0* 12.1*  HGB 16.6 15.6 16.1 16.5 16.5  HCT 52.7* 47.9 47.5 49.7 49.3  MCV 98.1 94.7 93.3 93.6 92.8  PLT 183 198 195 218 209   Basic Metabolic Panel: Recent Labs  Lab 12/26/23 0518 12/27/23 0440 12/28/23 0554 12/29/23 0254 12/30/23 0551  NA 141 139 136 140 139  K 5.4* 4.3 4.0 3.7 4.1  CL 104 98 93* 95* 95*  CO2 31 33* 34* 33* 34*  GLUCOSE 151* 131* 149* 160* 155*  BUN 27* 32* 34* 40* 42*   CREATININE 0.85 0.96 0.81 0.97 0.96  CALCIUM 7.8* 7.7* 8.2* 8.3* 8.1*  MG  --  2.3 2.6* 2.6* 2.6*  PHOS  --  4.7* 3.8 2.7 3.1    Studies: No results found.  Scheduled Meds:  amiodarone  400 mg Oral BID   apixaban  5 mg Oral BID   aspirin EC  81 mg Oral Daily   cyanocobalamin  1,000 mcg Intramuscular Daily   Followed by   Melene Muller ON 01/05/2024] vitamin B-12  1,000 mcg Oral Daily   digoxin  0.125 mg Oral Daily   diltiazem  180 mg Oral Daily   empagliflozin  10 mg Oral Daily   fluticasone furoate-vilanterol  1 puff Inhalation Daily   guaiFENesin  600 mg Oral BID   metoprolol tartrate  50 mg Oral BID   predniSONE  40 mg Oral Q breakfast   spironolactone  12.5 mg Oral Daily   torsemide  40 mg Oral Daily   Vitamin D (Ergocalciferol)  50,000 Units Oral Q7 days   Continuous Infusions: PRN Meds: acetaminophen **OR** acetaminophen, chlorpheniramine-HYDROcodone, ipratropium-albuterol, magnesium hydroxide, morphine injection, nitroGLYCERIN, ondansetron **OR** ondansetron (ZOFRAN) IV, traZODone  Time spent: 40 minutes  Author: Gillis Santa. MD [facility] 12/30/2023 12:21 PM  To reach On-call, see care teams to locate the attending and reach out to them via [email or web address]. If 7PM-7AM, please contact night-coverage If you still have difficulty reaching the attending provider, please page the [facility] (Director on Call) for [facility] on amion for assistance.

## 2023-12-31 DIAGNOSIS — R079 Chest pain, unspecified: Secondary | ICD-10-CM | POA: Diagnosis not present

## 2023-12-31 LAB — CBC WITH DIFFERENTIAL/PLATELET
Abs Immature Granulocytes: 0.15 10*3/uL — ABNORMAL HIGH (ref 0.00–0.07)
Basophils Absolute: 0 10*3/uL (ref 0.0–0.1)
Basophils Relative: 0 %
Eosinophils Absolute: 0 10*3/uL (ref 0.0–0.5)
Eosinophils Relative: 0 %
HCT: 51.8 % (ref 39.0–52.0)
Hemoglobin: 16.9 g/dL (ref 13.0–17.0)
Immature Granulocytes: 1 %
Lymphocytes Relative: 12 %
Lymphs Abs: 1.3 10*3/uL (ref 0.7–4.0)
MCH: 31.1 pg (ref 26.0–34.0)
MCHC: 32.6 g/dL (ref 30.0–36.0)
MCV: 95.4 fL (ref 80.0–100.0)
Monocytes Absolute: 1 10*3/uL (ref 0.1–1.0)
Monocytes Relative: 9 %
Neutro Abs: 8.5 10*3/uL — ABNORMAL HIGH (ref 1.7–7.7)
Neutrophils Relative %: 78 %
Platelets: 240 10*3/uL (ref 150–400)
RBC: 5.43 MIL/uL (ref 4.22–5.81)
RDW: 13.6 % (ref 11.5–15.5)
WBC: 11 10*3/uL — ABNORMAL HIGH (ref 4.0–10.5)
nRBC: 0 % (ref 0.0–0.2)

## 2023-12-31 LAB — COMPREHENSIVE METABOLIC PANEL
ALT: 96 U/L — ABNORMAL HIGH (ref 0–44)
AST: 25 U/L (ref 15–41)
Albumin: 3.3 g/dL — ABNORMAL LOW (ref 3.5–5.0)
Alkaline Phosphatase: 51 U/L (ref 38–126)
Anion gap: 8 (ref 5–15)
BUN: 41 mg/dL — ABNORMAL HIGH (ref 8–23)
CO2: 39 mmol/L — ABNORMAL HIGH (ref 22–32)
Calcium: 8.4 mg/dL — ABNORMAL LOW (ref 8.9–10.3)
Chloride: 91 mmol/L — ABNORMAL LOW (ref 98–111)
Creatinine, Ser: 1 mg/dL (ref 0.61–1.24)
GFR, Estimated: >60 mL/min (ref >60)
Glucose, Bld: 126 mg/dL — ABNORMAL HIGH (ref 70–99)
Potassium: 4.1 mmol/L (ref 3.5–5.1)
Sodium: 138 mmol/L (ref 135–145)
Total Bilirubin: 0.8 mg/dL (ref 0.0–1.2)
Total Protein: 6.3 g/dL — ABNORMAL LOW (ref 6.5–8.1)

## 2023-12-31 LAB — PHOSPHORUS: Phosphorus: 3.3 mg/dL (ref 2.5–4.6)

## 2023-12-31 LAB — MAGNESIUM: Magnesium: 2.5 mg/dL — ABNORMAL HIGH (ref 1.7–2.4)

## 2023-12-31 NOTE — Progress Notes (Signed)
[facility] Progress Note  Patient: Kenneth Benson    WUJ:[id number]  DOA: 12/25/2023     Date of Service: the patient was seen and examined on 12/31/2023  Chief Complaint  Patient presents with   Chest Pain   Brief hospital course: GARLAND HINCAPIE is a 63 y.o. male with medical history significant for paroxysmal atrial fibrillation, asthma, CHF, COPD, and coronary artery disease, who presented to the ER with acute onset of midsternal chest pain with radiation to her back as well as recent dyspnea with associated palpitations, cough which has been mainly dry as well as wheezing.  He denied any fever or chills.  She denied any abdominal pain or melena or bright red blood per rectum.  He quit smoking 7 days ago.  No dysuria, oliguria or hematuria or flank pain.   ED Course: When he came to the ER, temperature was 99.7, heart rate was 114 then 127 in atrial fibrillation with respiratory 12 with 30 and otherwise normal vital signs.  Labs revealed a BUN of 28 with low glucose of 146 and total protein 8.4 AST 56 ALT 141 and otherwise normal vital signs.    EKG as reviewed by me : EKG showed atrial fibrillation with controlled response of 97 with probable LVH with secondary repolarization. Imaging: Portable chest x-ray showed chronic cardiomegaly and mild bibasilar atelectasis with possible small left pleural effusion.   CT renal stone study: 1. No acute localizing process in the abdomen or pelvis. 2. Cholelithiasis. 3. Stable large bilateral inguinal hernias. The left inguinal hernia contains nondilated bowl. 4. Right Bosniak I benign renal cyst measuring 4.3 cm. No follow-up imaging is recommended.   The patient was given 10 mg of IV Cardizem for initial rapid ventricular sponsor with atrial fibrillation on monitor, DuoNeb, 125 mg IV Solu-Medrol, 4 mg of IV morphine sulfate and 4 mg of IV Zofran as well as 500 mL IV normal saline bolus and 4 baby aspirin.  He will be admitted to a progressive unit  observation bed for further evaluation and management.   Assessment and Plan:  # COPD with acute exacerbation Patient was recently admitted due to RSV on 12/21/2023 Solu-Medrol 80 mg every 12 hourly x 2 days followed by half prednisone 40 mg p.o. daily for 3 days. Started Breo Ellipta inhaler, DuoNeb every 6 hourly, Mucinex 600 mg p.o. twice daily Tussionex as needed for cough   # Paroxysmal atrial fibrillation  S/p Cardizem IV bolus, responded well. Continue Eliquis 5 mg p.o. twice daily Continue Cardizem CD1 80 mg p.o. daily, metoprolol 50 mg p.o. twice daily, digoxin 0.125 mg p.o. daily Started amiodarone 400 mg p.o. twice daily for 10 days Continue to follow cardiology for further recommendation  # Chest pain, resolved Troponin x 2 negative Continue aspirin 81 mg p.o. daily, nitro as needed Seen by cardiology, recommended chest pain secondary to A-fib with RVR and COPD.  No plan for further cardiac testing at this time     # Chronic combined systolic and diastolic heart failure  LVEF 40 to 45% -  continue digoxin, Jardiance, Lopressor, Aldactone and Demadex.   Body mass index is 37.97 kg/m.  Interventions:  Diet: Regular diet (patient does not want cardiac diet.) DVT Prophylaxis: Subcutaneous Lovenox   Advance goals of care discussion: Full code  Family Communication: family was Not present at bedside, at the time of interview.  The pt provided permission to discuss medical plan with the family. Opportunity was given to ask question  and all questions were answered satisfactorily.   Disposition:  Pt is from Home, admitted with Resp failure, A. Fib w/rvr, still has hypoxia, requiring 2 L oxygen via nasal cannula, dyspnea on exertion, which precludes a safe discharge. Discharge to Home, but patient mentioned that he does not have home, no safe discharge plan at this time, we will plan to discharge him on Monday Follow TOC for disposition plan   Subjective: No  significant events overnight, breathing improved, mild cough, saturating well on room air.  Denied any active issues. Patient still did not figure out his home situation, planning to discharge him tomorrow, we will get in touch with TOC   Physical Exam: General: NAD, lying comfortably Appear in no distress, affect appropriate Eyes: PERRLA ENT: Oral Mucosa Clear, moist  Neck: no JVD,  Cardiovascular: S1 and S2 Present, no Murmur, irregular rhythm Respiratory: Equal air entry bilaterally, mild crackles and Mild wheezes, significant improvement noticed today Abdomen: Bowel Sound present, Soft and no tenderness,  Skin: no rashes Extremities: no Pedal edema, no calf tenderness Neurologic: without any new focal findings Gait not checked due to patient safety concerns  Vitals:   12/30/23 2154 12/30/23 2355 12/31/23 0833 12/31/23 1358  BP: 122/79 135/83 111/70 126/73  Pulse: 84 89 65 68  Resp: 18 16 17 20   Temp: 97.8 F (36.6 C) 97.9 F (36.6 C) 98 F (36.7 C) 97.9 F (36.6 C)  TempSrc:  Oral    SpO2: 91% 91% 91% 93%  Weight:      Height:        Intake/Output Summary (Last 24 hours) at 12/31/2023 1502 Last data filed at 12/31/2023 1400 Gross per 24 hour  Intake 960 ml  Output 5700 ml  Net -4740 ml   Filed Weights   12/27/23 1003  Weight: 127 kg    Data Reviewed: I have personally reviewed and interpreted daily labs, tele strips, imagings as discussed above. I reviewed all nursing notes, pharmacy notes, vitals, pertinent old records I have discussed plan of care as described above with RN and patient/family.  CBC: Recent Labs  Lab 12/27/23 0440 12/28/23 0554 12/29/23 0254 12/30/23 0551 12/31/23 0340  WBC 14.6* 11.1* 13.0* 13.7* 11.0*  NEUTROABS 12.8* 10.3* 12.0* 12.1* 8.5*  HGB 15.6 16.1 16.5 16.5 16.9  HCT 47.9 47.5 49.7 49.3 51.8  MCV 94.7 93.3 93.6 92.8 95.4  PLT 198 195 218 209 240   Basic Metabolic Panel: Recent Labs  Lab 12/27/23 0440 12/28/23 0554  12/29/23 0254 12/30/23 0551 12/31/23 0340  NA 139 136 140 139 138  K 4.3 4.0 3.7 4.1 4.1  CL 98 93* 95* 95* 91*  CO2 33* 34* 33* 34* 39*  GLUCOSE 131* 149* 160* 155* 126*  BUN 32* 34* 40* 42* 41*  CREATININE 0.96 0.81 0.97 0.96 1.00  CALCIUM 7.7* 8.2* 8.3* 8.1* 8.4*  MG 2.3 2.6* 2.6* 2.6* 2.5*  PHOS 4.7* 3.8 2.7 3.1 3.3    Studies: No results found.  Scheduled Meds:  amiodarone  400 mg Oral BID   apixaban  5 mg Oral BID   aspirin EC  81 mg Oral Daily   cyanocobalamin  1,000 mcg Intramuscular Daily   Followed by   Melene Muller ON 01/05/2024] vitamin B-12  1,000 mcg Oral Daily   digoxin  0.125 mg Oral Daily   diltiazem  180 mg Oral Daily   empagliflozin  10 mg Oral Daily   fluticasone furoate-vilanterol  1 puff Inhalation Daily   guaiFENesin  600  mg Oral BID   metoprolol tartrate  50 mg Oral BID   predniSONE  40 mg Oral Q breakfast   spironolactone  12.5 mg Oral Daily   torsemide  40 mg Oral Daily   Vitamin D (Ergocalciferol)  50,000 Units Oral Q7 days   Continuous Infusions: PRN Meds: acetaminophen **OR** acetaminophen, chlorpheniramine-HYDROcodone, ipratropium-albuterol, magnesium hydroxide, morphine injection, nitroGLYCERIN, ondansetron **OR** ondansetron (ZOFRAN) IV, traZODone  Time spent: 35 minutes  Author: Gillis Santa. MD [facility] 12/31/2023 3:02 PM  To reach On-call, see care teams to locate the attending and reach out to them via [email or web address]. If 7PM-7AM, please contact night-coverage If you still have difficulty reaching the attending provider, please page the [facility] (Director on Call) for [facility] on amion for assistance.

## 2024-01-01 ENCOUNTER — Other Ambulatory Visit: Payer: Self-pay

## 2024-01-01 DIAGNOSIS — R079 Chest pain, unspecified: Secondary | ICD-10-CM | POA: Diagnosis not present

## 2024-01-01 MED ORDER — VITAMIN D (ERGOCALCIFEROL) 1.25 MG (50000 UNIT) PO CAPS
50000.0000 [IU] | ORAL_CAPSULE | ORAL | 0 refills | Status: DC
  Filled 2024-01-01: qty 12, 84d supply, fill #0

## 2024-01-01 MED ORDER — AMIODARONE HCL 200 MG PO TABS
ORAL_TABLET | ORAL | 0 refills | Status: DC
  Filled 2024-01-01: qty 112, 90d supply, fill #0

## 2024-01-01 MED ORDER — AMIODARONE HCL 200 MG PO TABS
200.0000 mg | ORAL_TABLET | Freq: Every day | ORAL | 2 refills | Status: DC
  Filled 2024-01-01: qty 90, 90d supply, fill #0
  Filled ????-??-??: fill #0

## 2024-01-01 MED ORDER — CYANOCOBALAMIN 1000 MCG PO TABS
1000.0000 ug | ORAL_TABLET | Freq: Every day | ORAL | 0 refills | Status: DC
  Filled 2024-01-01: qty 90, 90d supply, fill #0

## 2024-01-01 NOTE — Plan of Care (Signed)
Patient discharged per MD orders at this time.All dc instructions,education and medications reviewed with the patient.Pt expressed understanding and will comply with dc instructions. Follow up appointments was also communicated to the patient.no verbal c/o or any ssx of distress.Pt was dc home with self-care per order.patient was transported by a commercially owned vehicle to his destination.

## 2024-01-01 NOTE — Plan of Care (Signed)

## 2024-01-01 NOTE — Plan of Care (Signed)
  Problem: Clinical Measurements: Goal: Diagnostic test results will improve Outcome: Progressing   Problem: Activity: Goal: Risk for activity intolerance will decrease Outcome: Progressing   Problem: Nutrition: Goal: Adequate nutrition will be maintained Outcome: Progressing   Problem: Safety: Goal: Ability to remain free from injury will improve Outcome: Progressing   Problem: Activity: Goal: Ability to tolerate increased activity will improve Outcome: Progressing   Problem: Respiratory: Goal: Ability to maintain a clear airway will improve Outcome: Progressing

## 2024-01-01 NOTE — Discharge Summary (Signed)
[facility] Discharge Summary   Patient: Kenneth Benson ZOX:[id number]  PCP: Pcp, No  Date of admission: 12/25/2023   Date of discharge:  01/01/2024     Discharge Diagnoses:  Principal Problem:   Chest pain Active Problems:   COPD with acute exacerbation (HCC)   Chronic combined systolic and diastolic heart failure (HCC)   Paroxysmal atrial fibrillation (HCC)   Atrial fibrillation with RVR (HCC)   Admitted From: Home Disposition:  Home, but patient said he cannot go back to the same place.  TOC involved for safe disposition plan  Recommendations for Outpatient Follow-up:  PCP in 1 week Follow-up with cardiology in 1 week Follow up LABS/TEST:     Follow-up Information     Custovic, Rozell Searing, DO. Go in 1 week(s).   Specialty: Cardiology Contact information: [address] [place] [address] [phone]         PCP Follow up in 1 week(s).                 Diet recommendation: Cardiac diet  Activity: The patient is advised to gradually reintroduce usual activities, as tolerated  Discharge Condition: stable  Code Status: Full code   History of present illness: As per the H and P dictated on admission. Hospital Course:  Kenneth Benson is a 63 y.o. male with medical history significant for paroxysmal atrial fibrillation, asthma, CHF, COPD, and coronary artery disease, who presented to the ER with acute onset of midsternal chest pain with radiation to her back as well as recent dyspnea with associated palpitations, cough which has been mainly dry as well as wheezing.  He denied any fever or chills.  She denied any abdominal pain or melena or bright red blood per rectum.  He quit smoking 7 days ago.  No dysuria, oliguria or hematuria or flank pain.   ED Course: When he came to the ER, temperature was 99.7, heart rate was 114 then 127 in atrial fibrillation with respiratory 12 with 30 and otherwise normal vital signs.  Labs revealed a BUN of 28 with low  glucose of 146 and total protein 8.4 AST 56 ALT 141 and otherwise normal vital signs.    EKG as reviewed by me : EKG showed atrial fibrillation with controlled response of 97 with probable LVH with secondary repolarization. Imaging: Portable chest x-ray showed chronic cardiomegaly and mild bibasilar atelectasis with possible small left pleural effusion.   CT renal stone study: 1. No acute localizing process in the abdomen or pelvis. 2. Cholelithiasis. 3. Stable large bilateral inguinal hernias. The left inguinal hernia contains nondilated bowl. 4. Right Bosniak I benign renal cyst measuring 4.3 cm. No follow-up imaging is recommended.   The patient was given 10 mg of IV Cardizem for initial rapid ventricular sponsor with atrial fibrillation on monitor, DuoNeb, 125 mg IV Solu-Medrol, 4 mg of IV morphine sulfate and 4 mg of IV Zofran as well as 500 mL IV normal saline bolus and 4 baby aspirin.  He will be admitted to a progressive unit observation bed for further evaluation and management.     Assessment and Plan:   # COPD with acute exacerbation, resolved Patient was recently admitted due to RSV on 12/21/2023 S/p Solu-Medrol 80 mg every 12 hourly x 2 days followed by half prednisone 40 mg p.o. daily for 3 days. S/p Breo Ellipta inhaler, DuoNeb every 6 hourly, Mucinex 600 mg p.o. twice daily and Tussionex as needed for cough Continued albuterol and Brovana nebulizer pulm  meds on discharge  # Paroxysmal atrial fibrillation S/p Cardizem IV bolus, responded well. Continue Eliquis 5 mg p.o. twice daily Continue Cardizem CD1 80 mg p.o. daily, metoprolol 50 mg p.o. twice daily, digoxin 0.125 mg p.o. daily. Started amiodarone 400 mg p.o. twice daily for 10 days, followed by 200 mg p.o. daily.  Patient was seen by cardiology, started on high-dose amiodarone and patient was cleared to discharge and follow-up as an outpatient. # Chest pain, resolved, Troponin x 2 negative. Continue aspirin 81 mg p.o. daily,  nitro as needed. Seen by cardiology, recommended chest pain secondary to A-fib with RVR and COPD.  No plan for further cardiac testing at this time # Chronic combined systolic and diastolic heart failure  LVEF 40 to 45%, continue digoxin, Jardiance, Lopressor, Aldactone and Demadex. # Vitamin D insufficiency, started vitamin D 50,000 units p.o. weekly.  Follow with PCP to repeat vitamin D level after 3 to 6 months. # Vitamin B12 level 204, goal >400, started vitamin B12 1000 mcg IM injection daily followed by oral supplement on discharge.  Body mass index is 37.97 kg/m.  Nutrition Interventions:   Patient was ambulatory without any assistance. On the day of the discharge the patient's vitals were stable, and no other acute medical condition were reported by patient. the patient was felt safe to be discharge at Home.  Consultants: Cardiology Procedures: None  Discharge Exam: General: Appear in no distress, no Rash; Oral Mucosa Clear, moist. Cardiovascular: S1 and S2 Present, no Murmur, Respiratory: normal respiratory effort, Bilateral Air entry present and no Crackles, no wheezes Abdomen: Bowel Sound present, Soft and no tenderness, no hernia Extremities: no Pedal edema, no calf tenderness Neurology: alert and oriented to time, place, and person affect appropriate.  Filed Weights   12/27/23 1003  Weight: 127 kg   Vitals:   01/01/24 0745 01/01/24 1019  BP: 113/75   Pulse: 81 83  Resp: 17   Temp: 97.9 F (36.6 C)   SpO2: 94%     DISCHARGE MEDICATION: Allergies as of 01/01/2024   No Known Allergies      Medication List     STOP taking these medications    predniSONE 20 MG tablet Commonly known as: DELTASONE       TAKE these medications    albuterol (2.5 MG/3ML) 0.083% nebulizer solution Commonly known as: PROVENTIL Take 3 mLs (2.5 mg total) by nebulization every 6 (six) hours as needed for wheezing or shortness of breath.   Ventolin HFA 108 (90 Base) MCG/ACT  inhaler Generic drug: albuterol Inhale 2 puffs into the lungs every 6 (six) hours as needed for wheezing or shortness of breath.   amiodarone 400 MG tablet Commonly known as: PACERONE Take 1 tablet (400 mg total) by mouth 2 (two) times daily for 4 days, THEN 0.5 tablets (200 mg total) 2 (two) times daily for 10 days. Start taking on: January 01, 2024   amiodarone 200 MG tablet Commonly known as: PACERONE Take 1 tablet (200 mg total) by mouth daily. Start taking on: January 14, 2024   arformoterol 15 MCG/2ML Nebu Commonly known as: Brovana Take 2 mLs (15 mcg total) by nebulization 2 (two) times daily.   cyanocobalamin 1000 MCG tablet Take 1 tablet (1,000 mcg total) by mouth daily. Start taking on: January 05, 2024   digoxin 0.125 MG tablet Commonly known as: LANOXIN Take 1 tablet (0.125 mg total) by mouth daily.   diltiazem 180 MG 24 hr capsule Commonly known as: CARDIZEM CD Take  1 capsule (180 mg total) by mouth daily.   Eliquis 5 MG Tabs tablet Generic drug: apixaban Take 1 tablet (5 mg total) by mouth 2 (two) times daily.   Jardiance 10 MG Tabs tablet Generic drug: empagliflozin Take 1 tablet (10 mg total) by mouth daily.   metoprolol tartrate 50 MG tablet Commonly known as: LOPRESSOR Take 1 tablet (50 mg total) by mouth 2 (two) times daily.   spironolactone 25 MG tablet Commonly known as: ALDACTONE Take 0.5 tablets (12.5 mg total) by mouth daily. Hold this medication until you see your cardiologist   torsemide 20 MG tablet Commonly known as: DEMADEX Take 2 tablets (40 mg total) by mouth daily. Hold this medication until you see your cardiologist   Vitamin D (Ergocalciferol) 1.25 MG (50000 UNIT) Caps capsule Commonly known as: DRISDOL Take 1 capsule (50,000 Units total) by mouth every 7 (seven) days. Start taking on: January 05, 2024       No Known Allergies Discharge Instructions     Call MD for:  difficulty breathing, headache or visual disturbances    Complete by: As directed    Call MD for:  extreme fatigue   Complete by: As directed    Call MD for:  persistant dizziness or light-headedness   Complete by: As directed    Call MD for:  persistant nausea and vomiting   Complete by: As directed    Call MD for:  severe uncontrolled pain   Complete by: As directed    Call MD for:  temperature >100.4   Complete by: As directed    Diet - low sodium heart healthy   Complete by: As directed    Discharge instructions   Complete by: As directed    PCP in 1 week Follow-up with cardiology in 1 week   Increase activity slowly   Complete by: As directed        The results of significant diagnostics from this hospitalization (including imaging, microbiology, ancillary and laboratory) are listed below for reference.    Significant Diagnostic Studies: CT Renal Stone Study Result Date: 12/26/2023 CLINICAL DATA:  Abdominal and flank pain. EXAM: CT ABDOMEN AND PELVIS WITHOUT CONTRAST TECHNIQUE: Multidetector CT imaging of the abdomen and pelvis was performed following the standard protocol without IV contrast. RADIATION DOSE REDUCTION: This exam was performed according to the departmental dose-optimization program which includes automated exposure control, adjustment of the mA and/or kV according to patient size and/or use of iterative reconstruction technique. COMPARISON:  CTA abdomen and pelvis 12/16/2022 FINDINGS: Lower chest: No acute abnormality. Hepatobiliary: Small gallstone is present. There is no biliary ductal dilatation. T fat along the falciform ligament he liver is within normal limits. There is likely focal fatty infiltration along the falciform ligament. Pancreas: Unremarkable. No pancreatic ductal dilatation or surrounding inflammatory changes. Spleen: Normal in size without focal abnormality. Adrenals/Urinary Tract: There is a cyst in the inferior pole the right kidney measuring 4.3 cm. Otherwise, the kidneys, adrenal glands and bladder  are within normal limits. Stomach/Bowel: Stomach is within normal limits. Appendix appears normal. No evidence of bowel wall thickening, distention, or inflammatory changes. Vascular/Lymphatic: No significant vascular findings are present. No enlarged abdominal or pelvic lymph nodes. Reproductive: Prostate is unremarkable. Other: There are large bilateral inguinal hernias. The left inguinal hernia contains nondilated bowel, unchanged. There is no ascites or free air. Musculoskeletal: Chronic compression deformity of L3 appears unchanged. Degenerative changes affect the hips. IMPRESSION: 1. No acute localizing process in the abdomen or pelvis.  2. Cholelithiasis. 3. Stable large bilateral inguinal hernias. The left inguinal hernia contains nondilated bowel. 4. Right Bosniak I benign renal cyst measuring 4.3 cm. No follow-up imaging is recommended. JACR 2018 Feb; 264-273, Management of the Incidental Renal Mass on CT, [employer] 2021; 814-848, Bosniak Classification of Cystic Renal Masses, Version 2019. Electronically Signed   By: Darliss Cheney M.D.   On: 12/26/2023 01:10   DG Chest Port 1 View Result Date: 12/25/2023 CLINICAL DATA:  Chest pain. EXAM: PORTABLE CHEST 1 VIEW COMPARISON:  Radiograph 4 days ago 12/21/2023, CT 07/02/2023 FINDINGS: Stable cardiomegaly. Unchanged mediastinal contours. There may be a trace left pleural effusion. Mild bibasilar atelectasis without confluent airspace disease. No pulmonary edema. No pneumothorax. Stable osseous structures. IMPRESSION: 1. Chronic cardiomegaly. 2. Mild bibasilar atelectasis with possible small left pleural effusion. Electronically Signed   By: Narda Rutherford M.D.   On: 12/25/2023 23:32   DG Chest 2 View Result Date: 12/21/2023 CLINICAL DATA:  Shortness of breath. EXAM: CHEST - 2 VIEW COMPARISON:  Chest radiograph dated 09/01/2023. FINDINGS: The heart size and mediastinal contours are within normal limits. Both lungs are clear. The visualized skeletal  structures are unremarkable. IMPRESSION: No active cardiopulmonary disease. Electronically Signed   By: Elgie Collard M.D.   On: 12/21/2023 15:41    Microbiology: No results found for this or any previous visit (from the past 240 hours).   Labs: CBC: Recent Labs  Lab 12/27/23 0440 12/28/23 0554 12/29/23 0254 12/30/23 0551 12/31/23 0340  WBC 14.6* 11.1* 13.0* 13.7* 11.0*  NEUTROABS 12.8* 10.3* 12.0* 12.1* 8.5*  HGB 15.6 16.1 16.5 16.5 16.9  HCT 47.9 47.5 49.7 49.3 51.8  MCV 94.7 93.3 93.6 92.8 95.4  PLT 198 195 218 209 240   Basic Metabolic Panel: Recent Labs  Lab 12/27/23 0440 12/28/23 0554 12/29/23 0254 12/30/23 0551 12/31/23 0340  NA 139 136 140 139 138  K 4.3 4.0 3.7 4.1 4.1  CL 98 93* 95* 95* 91*  CO2 33* 34* 33* 34* 39*  GLUCOSE 131* 149* 160* 155* 126*  BUN 32* 34* 40* 42* 41*  CREATININE 0.96 0.81 0.97 0.96 1.00  CALCIUM 7.7* 8.2* 8.3* 8.1* 8.4*  MG 2.3 2.6* 2.6* 2.6* 2.5*  PHOS 4.7* 3.8 2.7 3.1 3.3   Liver Function Tests: Recent Labs  Lab 12/27/23 0440 12/28/23 0554 12/29/23 0254 12/30/23 0551 12/31/23 0340  AST 21 21 26 29 25   ALT 90* 84* 79* 96* 96*  ALKPHOS 45 47 48 47 51  BILITOT 0.6 0.9 0.7 0.8 0.8  PROT 6.3* 6.8 6.6 6.2* 6.3*  ALBUMIN 3.1* 3.2* 3.3* 3.0* 3.3*   Recent Labs  Lab 12/25/23 2311  LIPASE 31   No results for input(s): "AMMONIA" in the last 168 hours. Cardiac Enzymes: No results for input(s): "CKTOTAL", "CKMB", "CKMBINDEX", "TROPONINI" in the last 168 hours. BNP (last 3 results) Recent Labs    04/13/23 1037 07/02/23 1543 12/21/23 1425  BNP 1,178.2* 661.0* 154.0*   CBG: No results for input(s): "GLUCAP" in the last 168 hours.  Time spent: 35 minutes  Signed:  Gillis Santa  [facility] 01/01/2024 11:01 AM

## 2024-01-01 NOTE — Discharge Instructions (Signed)
Shelters Resource List  [employer] [facility] MISSION PROVIDED BY: [facility] [address] [place], [place], [place] Offers a faith-based shelter for homeless men, usually with substance use disorders. Residents receive counseling, life skills training, and help finding a job.  HOMELESS SHELTER PROVIDED BY: ALLIED CHURCHES OF [facility] [address] [place], [place], [place] Offers a shelter for men, women, and families experiencing homelessness. Food, clothing and other items are available for residents. Also offers support and services to help residents become self-sufficient. Offers temporary emergency housing for 30 days. Additional shelter may be available when temperatures drop below freezing but is not guaranteed.  FAMILY ABUSE SERVICES OF [facility] COUNTY PROVIDED BY: FAMILY ABUSE SERVICES OF [facility] [address], [place], [place] Offers services for victims of domestic violence. Offers a 24-hour crisis line and emergency shelter. Offers information and referrals to other community resources. Also offers court advocacy and support groups.  HOUSING CHOICE VOUCHER PROGRAM PROVIDED BY: [facility] [place] [address], [place], [place] Offers vouchers for approved Section 8 properties. Vouchers offer financial help with rent    [employer] MINISTRI[employer] PROVIDED BY: [facility], [employer], [place] Offers emergency shelter for victims of domestic violence. Also offers a 24-hour crisis hotline for victims of domestic violence, safety planning, information and referrals, case management, and support groups for victims of domestic violence.   ACT TOGETHER EMERGENCY SHELTER PROVIDED BY: [facility] [address], [place], [place] Offers a 21-day emergency shelter for youth experiencing a family crisis, abuse, or homelessness. Case management, supportive services, healthcare services, and more are available for  residents. SHELTER PROVIDED BY: [facility] [address], [place], [place] Offers a homeless shelter for people and families. Meals, showers, community referrals, case management, and more are available for residents. HEARTH TRANSITIONAL LIVING PROGRAM PROVIDED BY: [facility] [address], [place], [place] Offers an 70-month homeless shelter for younger adults experiencing homelessness. Case management, independent living skills education, and more are available for residents. PARTNERSHIP VILLAGE PROVIDED BY: [place] [facility] [address], [place], [place] Offers transitional housing for families and single people experiencing homelessness. Residents meet regularly with a case manager to work towards self-sufficiency TRANSITIONAL HOUSING PROVIDED BY: [facility] [address], [place], [place] Offers transitional housing for male veterans with disabilities. Residents receive meals, transportation, and clothing. Also offers support groups, nutrition classes, and peer support to residents.   WEAVER HOUSE PROVIDED BY: [place] [facility] [address], [place], [place] Offers shelter to adult men and women. Guests receive hot meals and case management. Also offers overnight shelter when temperatures drop during cold winter months  EMERGENCY FAMILY SHELTER PROVIDED BY: [facility] [address], [place],  Offers shelter and support services for families experiencing homelessness.   Transportation Resources  Agency Name: [facility] Agency Address: [address] [place], [place] [address] Phone: [phone] Email: [email or web address] Website: [email or web address] Service(s) Offered: Housing services, self-sufficiency, congregate meal program, weatherization program, Field seismologist program, emergency food assistance,  housing counseling, home ownership program,  wheels-towork program.  Agency Name: [facility] [employer] [phone]) Address: [address] [place], [place] [address] Phone: [phone] Website: www.acta-.com Service(s) Offered: Transportation for [employer], subscription and demand response; Dial-a-Ride for citizens 21 years of age or older.  Agency Name: Department of Social Services Address: [address] [place], [place] [address] Phone: [phone] Service(s) Offered: Child support services; child welfare services; food stamps; Medicaid; work first family assistance; and aid  with fuel,  rent, food and medicine, transportation assistance.  Agency Name: Disabled [employer] (DAV) Transportation  Network Phone: [phone] Service(s) Offered: Transports veterans to the [facility] medical center. Call  forty-eight hours in advance and leave the name, telephone  number, date, and time of appointment. Veteran will be  contacted by the driver the day before the appointment to  arrange a pick up point   Transportation Resources  Agency Name: [facility] Agency Address: [address] [place], [place] [address] Phone: [phone] Email: [email or web address] Website: [email or web address] Service(s) Offered: Housing services, self-sufficiency, congregate meal program, weatherization program, Field seismologist program, emergency food assistance,  housing counseling, home ownership program, wheels-towork program.  Agency Name: [facility] [employer] [phone]) Address: [address] [place], [place] [address] Phone: [phone] Website: [email or web address] Service(s) Offered: Transportation for [employer], subscription and demand response; Dial-a-Ride for citizens 7 years of age or older.  Agency Name: Department of Social Services Address: [address] [place], [place] [address] Phone: [phone] Service(s)  Offered: Child support services; child welfare services; food stamps; Medicaid; work first family assistance; and aid with fuel,  rent, food and medicine, transportation assistance.  Agency Name: Disabled [employer] (DAV) Transportation  Network Phone: [phone] Service(s) Offered: Transports veterans to the [facility] medical center. Call  forty-eight hours in advance and leave the name, telephone  number, date, and time of appointment. Veteran will be  contacted by the driver the day before the appointment to  arrange a pick up point    [employer] ACTA currently provides door to door services. ACTA connects with PART daily for services to [facility]. ACTA also performs contract services to [employer] operates 27 vehicles, all but 3 mini-vans are equipped with lifts for special needs as well as the general public. ACTA drivers are each CDL certified and trained in First Aid and CPR. ACTA was established in 2002 by [employer]. An independent Industrial/product designer. ACTA operates via Cytogeneticist with required Research scientist (physical sciences) from [place]. ACTA provides over 80,000 passenger trips each year, including [facility] and [employer] sites.  Call at least by 11 AM one business day prior to needing transportation  [employer].                      [place], [place] [address]     Office Hours: Monday-Friday  8 AM - 5 PM   Food Resources  Agency Name: [facility] Agency Address: [address], [place], [place] [address] Phone: [phone] Website: [email or web address] Service(s) Offered: Housing services, self-sufficiency, congregate meal program, weatherization program, Event organiser program, emergency food assistance,  housing counseling, home ownership program, wheels - to work program.   [employer] free for 60 and older at various locations from [employer], Monday-Friday:  [employer], [address] [place], [phone] -[facility], [address], [place] [phone]  -[facility] [place], [address] [place] [phone]  -[address], [address], [place], [phone]  Agency Name: [facility] Address: [phone] [address], [place], [place] [address] Phone: [phone] Website: [email or web address] Service(s) Offered: Home delivered hot, frozen, and emergency  meals. Grocery assistance program which matches  volunteers one-on-one with seniors unable to grocery shop  for themselves. Must be 60 years and older; less than 20  hours of in-home aide service,  limited or no driving ability;  live alone or with someone with a disability; live in  [place].  Agency Name: Ecologist [facility] [employer]) Address: [address], [place], [place] [address] Phone: [phone] Service(s) Offered: Food is served to shut-ins, homeless, elderly, and low income people in the community every Saturday (11:30 am-12:30 pm) and Sunday (12:30 pm-1:30pm). Volunteers also offer help and encouragement in seeking employment,  and spiritual guidance.  Agency Name: Department of Social Services Address: [address] [place], [place] [address] Phone: [phone] Service(s) Offered: Child support services; child welfare services; food stamps; Medicaid; work first family assistance; and aid with fuel,  rent, food and medicine.  Agency Name: Dietitian Address: [address] [place], [place] Phone: [phone] Website: [email or web address] Services Offered: Monday 10:00am-12:00, 8:00pm-9:00pm, and Friday 10:00am-12:00.  Agency Name: [employer] [facility] [place] Address: [address], [place], [place] [address] Phone: [phone] Website:  [email or web address] Service(s) Offered: Serves weekday meals, open from 11:30 am- 1:00 pm., and 6:30-7:30pm, Monday-Wednesday-Friday distributes food 3:30-6pm, Monday-Wednesday-Friday.  Agency Name: [facility] Address: [address], [place], [place] Phone: [phone] Website: [email or web address] Services Offered: Distributes food the 4th Saturday of the month, starting at 8:00 am  Agency Name: [facility] Address: [phone] [address], [place], [place] [address] Phone: [phone] Website: [email or web address] Service(s) Offered: Bread of life, weekly food pantry. Open Wednesdays from 10:00am-noon.  Agency Name: [facility] [employer] Address: [address] [place], [place], [place] Phone: [phone] Services Offered: Distributes food 9am-1pm, Monday-Thursday. Call for details.  Agency Name: [facility] Address: [address], [place], [place] [address] Phone: [phone] Website: [email or web address] Service(s) Offered: Games developer. Call for assistance.  Agency Name: [facility] Address: [address] [place], [place] [address] Phone: [phone] Service Offered: Emergency Food Pantry. Call for appointment.  Agency Name: [facility] Address: [address] [place], [place] [address] Phone: [phone] Website: [email or web address] Services Offered: Games developer. Call for details  Agency Name: [facility] Address: [address] [place], [place] Phone: [phone] Website: [email or web address] Service(s) Offered: Emergency Food Pantry. Call for details.  Agency Name: Holiday representative Address: [address] [place], [place] [address] Phone: [phone] or [phone] Website: [email or web address] Service(s) Offered: Distribute food 9am-11:30 am, Tuesday-Friday, and 1-3:30pm, Monday-Friday. Food pantry  Monday-Friday 1pm-3pm, fresh items, Mon.-Wed.-Fri.  Agency Name: [facility] Address: [address] [place], [place] [address] Phone: [phone] Website: [email or web address] Services Offered: Distribute food Tues and Sats from 9:00am-noon. Closed 1st Saturday of each month. Call for details  Agency Name: [employer] Address: Reynaldo [place]ium [facility] [address], [place] [address] Phone: [phone]  Services Offered: Delivers meals every Thursday

## 2024-01-10 ENCOUNTER — Other Ambulatory Visit: Payer: Self-pay

## 2024-01-10 ENCOUNTER — Encounter (HOSPITAL_COMMUNITY): Payer: Self-pay | Admitting: *Deleted

## 2024-01-10 ENCOUNTER — Inpatient Hospital Stay (HOSPITAL_COMMUNITY)
Admission: EM | Admit: 2024-01-10 | Discharge: 2024-01-15 | DRG: 418 | Disposition: A | Discharge status: Home / Self Care | Payer: Medicaid Other | Attending: Student | Admitting: Student

## 2024-01-10 DIAGNOSIS — R7401 Elevation of levels of liver transaminase levels: Secondary | ICD-10-CM

## 2024-01-10 DIAGNOSIS — J9611 Chronic respiratory failure with hypoxia: Secondary | ICD-10-CM | POA: Diagnosis present

## 2024-01-10 DIAGNOSIS — F17210 Nicotine dependence, cigarettes, uncomplicated: Secondary | ICD-10-CM | POA: Diagnosis present

## 2024-01-10 DIAGNOSIS — E538 Deficiency of other specified B group vitamins: Secondary | ICD-10-CM | POA: Insufficient documentation

## 2024-01-10 DIAGNOSIS — Z7984 Long term (current) use of oral hypoglycemic drugs: Secondary | ICD-10-CM

## 2024-01-10 DIAGNOSIS — Z79899 Other long term (current) drug therapy: Secondary | ICD-10-CM

## 2024-01-10 DIAGNOSIS — K8065 Calculus of gallbladder and bile duct with chronic cholecystitis with obstruction: Principal | ICD-10-CM | POA: Diagnosis present

## 2024-01-10 DIAGNOSIS — Z5901 Sheltered homelessness: Secondary | ICD-10-CM

## 2024-01-10 DIAGNOSIS — Z91411 Personal history of adult psychological abuse: Secondary | ICD-10-CM

## 2024-01-10 DIAGNOSIS — Z9981 Dependence on supplemental oxygen: Secondary | ICD-10-CM

## 2024-01-10 DIAGNOSIS — Z91198 Patient's noncompliance with other medical treatment and regimen for other reason: Secondary | ICD-10-CM

## 2024-01-10 DIAGNOSIS — E559 Vitamin D deficiency, unspecified: Secondary | ICD-10-CM | POA: Diagnosis present

## 2024-01-10 DIAGNOSIS — Z6837 Body mass index (BMI) 37.0-37.9, adult: Secondary | ICD-10-CM

## 2024-01-10 DIAGNOSIS — Z5982 Transportation insecurity: Secondary | ICD-10-CM

## 2024-01-10 DIAGNOSIS — Z91410 Personal history of adult physical and sexual abuse: Secondary | ICD-10-CM

## 2024-01-10 DIAGNOSIS — K838 Other specified diseases of biliary tract: Secondary | ICD-10-CM | POA: Diagnosis present

## 2024-01-10 DIAGNOSIS — K8050 Calculus of bile duct without cholangitis or cholecystitis without obstruction: Principal | ICD-10-CM

## 2024-01-10 DIAGNOSIS — Z8249 Family history of ischemic heart disease and other diseases of the circulatory system: Secondary | ICD-10-CM

## 2024-01-10 DIAGNOSIS — K8020 Calculus of gallbladder without cholecystitis without obstruction: Secondary | ICD-10-CM

## 2024-01-10 DIAGNOSIS — I5042 Chronic combined systolic (congestive) and diastolic (congestive) heart failure: Secondary | ICD-10-CM | POA: Diagnosis present

## 2024-01-10 DIAGNOSIS — R161 Splenomegaly, not elsewhere classified: Secondary | ICD-10-CM | POA: Diagnosis present

## 2024-01-10 DIAGNOSIS — Z8709 Personal history of other diseases of the respiratory system: Secondary | ICD-10-CM

## 2024-01-10 DIAGNOSIS — R109 Unspecified abdominal pain: Secondary | ICD-10-CM | POA: Diagnosis present

## 2024-01-10 DIAGNOSIS — Z7901 Long term (current) use of anticoagulants: Secondary | ICD-10-CM

## 2024-01-10 DIAGNOSIS — Z5941 Food insecurity: Secondary | ICD-10-CM

## 2024-01-10 DIAGNOSIS — I2510 Atherosclerotic heart disease of native coronary artery without angina pectoris: Secondary | ICD-10-CM | POA: Diagnosis present

## 2024-01-10 DIAGNOSIS — Z8679 Personal history of other diseases of the circulatory system: Secondary | ICD-10-CM

## 2024-01-10 DIAGNOSIS — I340 Nonrheumatic mitral (valve) insufficiency: Secondary | ICD-10-CM | POA: Diagnosis present

## 2024-01-10 DIAGNOSIS — Z7951 Long term (current) use of inhaled steroids: Secondary | ICD-10-CM

## 2024-01-10 DIAGNOSIS — E66812 Obesity, class 2: Secondary | ICD-10-CM | POA: Diagnosis present

## 2024-01-10 DIAGNOSIS — J4489 Other specified chronic obstructive pulmonary disease: Secondary | ICD-10-CM | POA: Diagnosis present

## 2024-01-10 DIAGNOSIS — I480 Paroxysmal atrial fibrillation: Secondary | ICD-10-CM | POA: Diagnosis present

## 2024-01-10 LAB — COMPREHENSIVE METABOLIC PANEL
ALT: 597 U/L — ABNORMAL HIGH (ref 0–44)
AST: 171 U/L — ABNORMAL HIGH (ref 15–41)
Albumin: 3.2 g/dL — ABNORMAL LOW (ref 3.5–5.0)
Alkaline Phosphatase: 185 U/L — ABNORMAL HIGH (ref 38–126)
Anion gap: 10 (ref 5–15)
BUN: 15 mg/dL (ref 8–23)
CO2: 25 mmol/L (ref 22–32)
Calcium: 8.5 mg/dL — ABNORMAL LOW (ref 8.9–10.3)
Chloride: 101 mmol/L (ref 98–111)
Creatinine, Ser: 1.08 mg/dL (ref 0.61–1.24)
GFR, Estimated: >60 mL/min (ref >60)
Glucose, Bld: 121 mg/dL — ABNORMAL HIGH (ref 70–99)
Potassium: 4.2 mmol/L (ref 3.5–5.1)
Sodium: 136 mmol/L (ref 135–145)
Total Bilirubin: 3.7 mg/dL — ABNORMAL HIGH (ref 0.0–1.2)
Total Protein: 6.8 g/dL (ref 6.5–8.1)

## 2024-01-10 LAB — CBC WITH DIFFERENTIAL/PLATELET
Abs Immature Granulocytes: 0.12 10*3/uL — ABNORMAL HIGH (ref 0.00–0.07)
Basophils Absolute: 0 10*3/uL (ref 0.0–0.1)
Basophils Relative: 1 %
Eosinophils Absolute: 0.2 10*3/uL (ref 0.0–0.5)
Eosinophils Relative: 3 %
HCT: 48 % (ref 39.0–52.0)
Hemoglobin: 15.9 g/dL (ref 13.0–17.0)
Immature Granulocytes: 2 %
Lymphocytes Relative: 15 %
Lymphs Abs: 1.1 10*3/uL (ref 0.7–4.0)
MCH: 30.8 pg (ref 26.0–34.0)
MCHC: 33.1 g/dL (ref 30.0–36.0)
MCV: 93 fL (ref 80.0–100.0)
Monocytes Absolute: 0.5 10*3/uL (ref 0.1–1.0)
Monocytes Relative: 7 %
Neutro Abs: 5.4 10*3/uL (ref 1.7–7.7)
Neutrophils Relative %: 72 %
Platelets: 258 10*3/uL (ref 150–400)
RBC: 5.16 MIL/uL (ref 4.22–5.81)
RDW: 14.6 % (ref 11.5–15.5)
WBC: 7.5 10*3/uL (ref 4.0–10.5)
nRBC: 0 % (ref 0.0–0.2)

## 2024-01-10 LAB — LIPASE, BLOOD: Lipase: 28 U/L (ref 11–51)

## 2024-01-10 NOTE — ED Triage Notes (Signed)
The pt has been a pt at [facility] he was admitted the last was jan 26  all his records are in [facility]  the pt reports that he is home less

## 2024-01-10 NOTE — ED Provider Triage Note (Signed)
Emergency Medicine Provider Triage Evaluation Note  DERON POOLE , a 63 y.o. male  was evaluated in triage.  Pt complains of epigastric pain to back. Seen at [facility] for same end of January   Review of Systems  Positive: Constipation, epigastric pain, Nausea Negative: Vomiting, diarrhea, CP, SHOB, body aches, fever, cough, congestion  Physical Exam  BP 115/80 (BP Location: Left Arm)   Pulse 80   Temp 97.7 F (36.5 C) (Oral)   Resp 16   Ht 6' (1.829 m)   Wt 127 kg   SpO2 97%   BMI 37.97 kg/m  Gen:   Awake, no distress   Resp:  Normal effort  MSK:   Moves extremities without difficulty  Other:    Medical Decision Making  Medically screening exam initiated at 6:31 PM.  Appropriate orders placed.  Eliezer Champagne was informed that the remainder of the evaluation will be completed by another provider, this initial triage assessment does not replace that evaluation, and the importance of remaining in the ED until their evaluation is complete.  Labs ordered   Dolphus Jenny, [place] 01/10/24 1610

## 2024-01-11 ENCOUNTER — Inpatient Hospital Stay (HOSPITAL_COMMUNITY): Payer: Medicaid Other

## 2024-01-11 ENCOUNTER — Encounter (HOSPITAL_COMMUNITY): Payer: Self-pay | Admitting: Internal Medicine

## 2024-01-11 ENCOUNTER — Emergency Department (HOSPITAL_COMMUNITY): Payer: Medicaid Other

## 2024-01-11 DIAGNOSIS — R1013 Epigastric pain: Secondary | ICD-10-CM | POA: Diagnosis not present

## 2024-01-11 DIAGNOSIS — R109 Unspecified abdominal pain: Secondary | ICD-10-CM | POA: Diagnosis present

## 2024-01-11 DIAGNOSIS — I340 Nonrheumatic mitral (valve) insufficiency: Secondary | ICD-10-CM | POA: Diagnosis present

## 2024-01-11 DIAGNOSIS — Z91198 Patient's noncompliance with other medical treatment and regimen for other reason: Secondary | ICD-10-CM | POA: Diagnosis not present

## 2024-01-11 DIAGNOSIS — I110 Hypertensive heart disease with heart failure: Secondary | ICD-10-CM | POA: Diagnosis not present

## 2024-01-11 DIAGNOSIS — Z6837 Body mass index (BMI) 37.0-37.9, adult: Secondary | ICD-10-CM | POA: Diagnosis not present

## 2024-01-11 DIAGNOSIS — R7989 Other specified abnormal findings of blood chemistry: Secondary | ICD-10-CM

## 2024-01-11 DIAGNOSIS — I5042 Chronic combined systolic (congestive) and diastolic (congestive) heart failure: Secondary | ICD-10-CM | POA: Diagnosis present

## 2024-01-11 DIAGNOSIS — K8071 Calculus of gallbladder and bile duct without cholecystitis with obstruction: Secondary | ICD-10-CM | POA: Diagnosis not present

## 2024-01-11 DIAGNOSIS — I5022 Chronic systolic (congestive) heart failure: Secondary | ICD-10-CM | POA: Diagnosis not present

## 2024-01-11 DIAGNOSIS — Z8709 Personal history of other diseases of the respiratory system: Secondary | ICD-10-CM | POA: Diagnosis not present

## 2024-01-11 DIAGNOSIS — Z5941 Food insecurity: Secondary | ICD-10-CM | POA: Diagnosis not present

## 2024-01-11 DIAGNOSIS — Z9981 Dependence on supplemental oxygen: Secondary | ICD-10-CM | POA: Diagnosis not present

## 2024-01-11 DIAGNOSIS — I480 Paroxysmal atrial fibrillation: Secondary | ICD-10-CM

## 2024-01-11 DIAGNOSIS — Z91410 Personal history of adult physical and sexual abuse: Secondary | ICD-10-CM | POA: Diagnosis not present

## 2024-01-11 DIAGNOSIS — E66812 Obesity, class 2: Secondary | ICD-10-CM | POA: Diagnosis present

## 2024-01-11 DIAGNOSIS — K838 Other specified diseases of biliary tract: Secondary | ICD-10-CM | POA: Diagnosis present

## 2024-01-11 DIAGNOSIS — Z8679 Personal history of other diseases of the circulatory system: Secondary | ICD-10-CM

## 2024-01-11 DIAGNOSIS — E559 Vitamin D deficiency, unspecified: Secondary | ICD-10-CM | POA: Diagnosis present

## 2024-01-11 DIAGNOSIS — Z5901 Sheltered homelessness: Secondary | ICD-10-CM | POA: Diagnosis not present

## 2024-01-11 DIAGNOSIS — K8061 Calculus of gallbladder and bile duct with cholecystitis, unspecified, with obstruction: Secondary | ICD-10-CM | POA: Diagnosis not present

## 2024-01-11 DIAGNOSIS — I509 Heart failure, unspecified: Secondary | ICD-10-CM | POA: Diagnosis not present

## 2024-01-11 DIAGNOSIS — Z7984 Long term (current) use of oral hypoglycemic drugs: Secondary | ICD-10-CM | POA: Diagnosis not present

## 2024-01-11 DIAGNOSIS — J9611 Chronic respiratory failure with hypoxia: Secondary | ICD-10-CM | POA: Diagnosis present

## 2024-01-11 DIAGNOSIS — E538 Deficiency of other specified B group vitamins: Secondary | ICD-10-CM | POA: Diagnosis not present

## 2024-01-11 DIAGNOSIS — Z8249 Family history of ischemic heart disease and other diseases of the circulatory system: Secondary | ICD-10-CM | POA: Diagnosis not present

## 2024-01-11 DIAGNOSIS — Z7951 Long term (current) use of inhaled steroids: Secondary | ICD-10-CM | POA: Diagnosis not present

## 2024-01-11 DIAGNOSIS — Z79899 Other long term (current) drug therapy: Secondary | ICD-10-CM | POA: Diagnosis not present

## 2024-01-11 DIAGNOSIS — R7401 Elevation of levels of liver transaminase levels: Secondary | ICD-10-CM | POA: Diagnosis not present

## 2024-01-11 DIAGNOSIS — J4489 Other specified chronic obstructive pulmonary disease: Secondary | ICD-10-CM | POA: Diagnosis present

## 2024-01-11 DIAGNOSIS — K8020 Calculus of gallbladder without cholecystitis without obstruction: Secondary | ICD-10-CM | POA: Diagnosis not present

## 2024-01-11 DIAGNOSIS — Z7901 Long term (current) use of anticoagulants: Secondary | ICD-10-CM | POA: Diagnosis not present

## 2024-01-11 DIAGNOSIS — I2510 Atherosclerotic heart disease of native coronary artery without angina pectoris: Secondary | ICD-10-CM | POA: Diagnosis present

## 2024-01-11 DIAGNOSIS — Z5982 Transportation insecurity: Secondary | ICD-10-CM | POA: Diagnosis not present

## 2024-01-11 DIAGNOSIS — R161 Splenomegaly, not elsewhere classified: Secondary | ICD-10-CM | POA: Diagnosis present

## 2024-01-11 DIAGNOSIS — J449 Chronic obstructive pulmonary disease, unspecified: Secondary | ICD-10-CM | POA: Diagnosis not present

## 2024-01-11 DIAGNOSIS — K8051 Calculus of bile duct without cholangitis or cholecystitis with obstruction: Secondary | ICD-10-CM | POA: Diagnosis not present

## 2024-01-11 DIAGNOSIS — K8065 Calculus of gallbladder and bile duct with chronic cholecystitis with obstruction: Secondary | ICD-10-CM | POA: Diagnosis present

## 2024-01-11 DIAGNOSIS — F17210 Nicotine dependence, cigarettes, uncomplicated: Secondary | ICD-10-CM | POA: Diagnosis present

## 2024-01-11 DIAGNOSIS — K8050 Calculus of bile duct without cholangitis or cholecystitis without obstruction: Secondary | ICD-10-CM | POA: Diagnosis not present

## 2024-01-11 LAB — HEPATITIS PANEL, ACUTE
HCV Ab: NONREACTIVE
Hep A IgM: NONREACTIVE
Hep B C IgM: NONREACTIVE
Hepatitis B Surface Ag: NONREACTIVE

## 2024-01-11 LAB — COMPREHENSIVE METABOLIC PANEL
ALT: 463 U/L — ABNORMAL HIGH (ref 0–44)
AST: 127 U/L — ABNORMAL HIGH (ref 15–41)
Albumin: 2.8 g/dL — ABNORMAL LOW (ref 3.5–5.0)
Alkaline Phosphatase: 159 U/L — ABNORMAL HIGH (ref 38–126)
Anion gap: 9 (ref 5–15)
BUN: 12 mg/dL (ref 8–23)
CO2: 23 mmol/L (ref 22–32)
Calcium: 8 mg/dL — ABNORMAL LOW (ref 8.9–10.3)
Chloride: 104 mmol/L (ref 98–111)
Creatinine, Ser: 0.94 mg/dL (ref 0.61–1.24)
GFR, Estimated: >60 mL/min (ref >60)
Glucose, Bld: 128 mg/dL — ABNORMAL HIGH (ref 70–99)
Potassium: 3.9 mmol/L (ref 3.5–5.1)
Sodium: 136 mmol/L (ref 135–145)
Total Bilirubin: 5.6 mg/dL — ABNORMAL HIGH (ref 0.0–1.2)
Total Protein: 6.1 g/dL — ABNORMAL LOW (ref 6.5–8.1)

## 2024-01-11 LAB — CBC
HCT: 45 % (ref 39.0–52.0)
Hemoglobin: 14.8 g/dL (ref 13.0–17.0)
MCH: 30.7 pg (ref 26.0–34.0)
MCHC: 32.9 g/dL (ref 30.0–36.0)
MCV: 93.4 fL (ref 80.0–100.0)
Platelets: 211 10*3/uL (ref 150–400)
RBC: 4.82 MIL/uL (ref 4.22–5.81)
RDW: 14.6 % (ref 11.5–15.5)
WBC: 6.5 10*3/uL (ref 4.0–10.5)
nRBC: 0 % (ref 0.0–0.2)

## 2024-01-11 MED ORDER — ARFORMOTEROL TARTRATE 15 MCG/2ML IN NEBU
15.0000 ug | INHALATION_SOLUTION | Freq: Two times a day (BID) | RESPIRATORY_TRACT | Status: DC
  Administered 2024-01-11 – 2024-01-15 (×7): 15 ug via RESPIRATORY_TRACT
  Filled 2024-01-11 (×7): qty 2

## 2024-01-11 MED ORDER — LEVALBUTEROL HCL 0.63 MG/3ML IN NEBU
0.6300 mg | INHALATION_SOLUTION | Freq: Four times a day (QID) | RESPIRATORY_TRACT | Status: DC | PRN
  Administered 2024-01-11: 0.63 mg via RESPIRATORY_TRACT
  Filled 2024-01-11: qty 3

## 2024-01-11 MED ORDER — ONDANSETRON HCL 4 MG/2ML IJ SOLN: 4.0000 mg | Freq: Four times a day (QID) | INTRAMUSCULAR | Status: DC | PRN

## 2024-01-11 MED ORDER — SODIUM CHLORIDE 0.9% FLUSH
3.0000 mL | Freq: Two times a day (BID) | INTRAVENOUS | Status: DC
  Administered 2024-01-11 – 2024-01-14 (×8): 3 mL via INTRAVENOUS

## 2024-01-11 MED ORDER — HYDROMORPHONE HCL 1 MG/ML IJ SOLN
0.5000 mg | INTRAMUSCULAR | Status: DC | PRN
  Administered 2024-01-11 – 2024-01-15 (×6): 1 mg via INTRAVENOUS
  Filled 2024-01-11 (×7): qty 1

## 2024-01-11 MED ORDER — UMECLIDINIUM BROMIDE 62.5 MCG/ACT IN AEPB
1.0000 | INHALATION_SPRAY | Freq: Every day | RESPIRATORY_TRACT | Status: DC
  Administered 2024-01-12 – 2024-01-15 (×4): 1 via RESPIRATORY_TRACT
  Filled 2024-01-11: qty 7

## 2024-01-11 MED ORDER — IBUPROFEN 200 MG PO TABS: 200.0000 mg | ORAL_TABLET | Freq: Four times a day (QID) | ORAL | Status: DC | PRN

## 2024-01-11 MED ORDER — APIXABAN 5 MG PO TABS: 5.0000 mg | ORAL_TABLET | Freq: Two times a day (BID) | ORAL | Status: DC

## 2024-01-11 MED ORDER — ROSUVASTATIN CALCIUM 5 MG PO TABS: 5.0000 mg | ORAL_TABLET | Freq: Every day | ORAL | Status: DC

## 2024-01-11 MED ORDER — ARFORMOTEROL TARTRATE 15 MCG/2ML IN NEBU: 15.0000 ug | INHALATION_SOLUTION | Freq: Two times a day (BID) | RESPIRATORY_TRACT | Status: DC

## 2024-01-11 MED ORDER — FLUTICASONE FUROATE-VILANTEROL 200-25 MCG/ACT IN AEPB
1.0000 | INHALATION_SPRAY | Freq: Every day | RESPIRATORY_TRACT | Status: DC
  Administered 2024-01-12 – 2024-01-15 (×4): 1 via RESPIRATORY_TRACT
  Filled 2024-01-11: qty 28

## 2024-01-11 MED ORDER — METOPROLOL TARTRATE 50 MG PO TABS
50.0000 mg | ORAL_TABLET | Freq: Two times a day (BID) | ORAL | Status: DC
  Administered 2024-01-11 – 2024-01-15 (×8): 50 mg via ORAL
  Filled 2024-01-11 (×7): qty 1
  Filled 2024-01-11: qty 2

## 2024-01-11 MED ORDER — SODIUM CHLORIDE 0.9% FLUSH: 3.0000 mL | INTRAVENOUS | Status: DC | PRN

## 2024-01-11 MED ORDER — ACETAMINOPHEN 325 MG PO TABS: 650.0000 mg | ORAL_TABLET | Freq: Four times a day (QID) | ORAL | Status: DC | PRN

## 2024-01-11 MED ORDER — DIGOXIN 125 MCG PO TABS
0.1250 mg | ORAL_TABLET | Freq: Every day | ORAL | Status: DC
  Administered 2024-01-11 – 2024-01-15 (×4): 0.125 mg via ORAL
  Filled 2024-01-11 (×4): qty 1

## 2024-01-11 MED ORDER — ACETAMINOPHEN 650 MG RE SUPP: 650.0000 mg | Freq: Four times a day (QID) | RECTAL | Status: DC | PRN

## 2024-01-11 MED ORDER — FENTANYL CITRATE PF 50 MCG/ML IJ SOSY
100.0000 ug | PREFILLED_SYRINGE | Freq: Once | INTRAMUSCULAR | Status: AC
  Administered 2024-01-11: 100 ug via INTRAVENOUS
  Filled 2024-01-11: qty 2

## 2024-01-11 MED ORDER — GADOBUTROL 1 MMOL/ML IV SOLN
10.0000 mL | Freq: Once | INTRAVENOUS | Status: AC | PRN
Start: 2024-01-11 — End: 2024-01-11
  Administered 2024-01-11: 10 mL via INTRAVENOUS

## 2024-01-11 MED ORDER — ALBUTEROL SULFATE HFA 108 (90 BASE) MCG/ACT IN AERS: 2.0000 | INHALATION_SPRAY | Freq: Four times a day (QID) | RESPIRATORY_TRACT | Status: DC | PRN

## 2024-01-11 MED ORDER — SODIUM CHLORIDE 0.9 % IV SOLN: 250.0000 mL | INTRAVENOUS | Status: AC | PRN

## 2024-01-11 MED ORDER — ONDANSETRON HCL 4 MG PO TABS: 4.0000 mg | ORAL_TABLET | Freq: Four times a day (QID) | ORAL | Status: DC | PRN

## 2024-01-11 MED ORDER — EMPAGLIFLOZIN 10 MG PO TABS: 10.0000 mg | ORAL_TABLET | Freq: Every day | ORAL | Status: DC

## 2024-01-11 NOTE — Progress Notes (Addendum)
Same day note  Kenneth Benson is a 63 y.o. 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  Patient was recently discharged from the hospital on 01/01/2024 after COPD exacerbation treatment and also had A-fib with RVR at that time which was treated with IV Cardizem and subsequently changed to oral Cardizem with metoprolol digoxin and amiodarone.  In the ED on this presentation patient was hemodynamically stable.  CBC unremarkable.  LFTs elevated at  AST 171, ALT 597, alkaline phosphatase 195 and elevated bilirubin 3.7. Normal lipase level 28.  Ultrasound of the liver showed hepatic ultrasound gallstones within the contracted gallbladder.  Surgery was consulted and patient was considered for admission to the hospital for further evaluation and treatment.   Patient seen and examined at bedside.  Patient was admitted to the hospital for abdominal pain.  At the time of my evaluation, patient complains of no chest pain or shortness of breath.  Denies overt nausea or vomiting at this time.  Physical examination reveals obese built male, no right upper quadrant tenderness.  Had received pain medication.  Laboratory data and imaging was reviewed   Assessment and Plan:  Elevated LFT. Cholelithiasis Ultrasound sound of the right upper quadrant showed contracted gallbladder without acute cholecystitis.  General surgery on board.  Plan for MRCP. Holding amiodarone in the setting of significant transaminitis.    Paroxysmal atrial fibrillation Recent A-fib with RVR and was supposed to be on amiodarone and Cardizem digoxin as outpatient.  Amiodarone currently on hold due to elevated LFTs.  Continue Eliquis metoprolol and  digoxin.  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  Hold Jardiance.  History of CAD Continue metoprolol.   History of COPD Continue stable on room air. - Continue Breo Ellipta and Xopenex   Vitamin D deficiency -Continue oral vitamin D supplement.    No Charge  Signed,  Tenny Craw, MD [facility]

## 2024-01-11 NOTE — Discharge Instructions (Addendum)
[facility] @ [address], [place] [facility] @ [address], [place]   CCS ______[facility] LAPAROSCOPIC SURGERY: POST OP INSTRUCTIONS Always review your discharge instruction sheet given to you by the facility where your surgery was performed. IF YOU HAVE DISABILITY OR FAMILY LEAVE FORMS, YOU MUST BRING THEM TO THE OFFICE FOR PROCESSING.   DO NOT GIVE THEM TO YOUR DOCTOR.  A prescription for pain medication may be given to you upon discharge.  Take your pain medication as prescribed, if needed.  If narcotic pain medicine is not needed, then you may take acetaminophen (Tylenol) or ibuprofen (Advil) as needed. Take your usually prescribed medications unless otherwise directed. If you need a refill on your pain medication, please contact your pharmacy.  They will contact our office to request authorization. Prescriptions will not be filled after 5pm or on week-ends. You should follow a light diet the first few days after arrival home, such as soup and crackers, etc.  Be sure to include lots of fluids daily. Most patients will experience some swelling and bruising in the area of the incisions.  Ice packs will help.  Swelling and bruising can take several days to resolve.  It is common to experience some constipation if taking pain medication after surgery.  Increasing fluid intake and taking a stool softener (such as Colace) will usually help or prevent this problem from occurring.  A mild laxative (Milk of Magnesia or Miralax) should be taken according to package instructions if there are no bowel movements after 48 hours. Unless discharge instructions indicate otherwise, you may remove your bandages 24-48 hours after surgery, and you may shower at that time.  You may have steri-strips (small skin tapes) in place directly over the incision.  These strips should be left on the skin for 7-10 days.  If your  surgeon used skin glue on the incision, you may shower in 24 hours.  The glue will flake off over the next 2-3 weeks.  Any sutures or staples will be removed at the office during your follow-up visit. ACTIVITIES:  You may resume regular (light) daily activities beginning the next day--such as daily self-care, walking, climbing stairs--gradually increasing activities as tolerated.  You may have sexual intercourse when it is comfortable.  Refrain from any heavy lifting or straining until approved by your doctor. You may drive when you are no longer taking prescription pain medication, you can comfortably wear a seatbelt, and you can safely maneuver your car and apply brakes. RETURN TO WORK:  __________________________________________________________ Kenneth Benson should see your doctor in the office for a follow-up appointment approximately 2-3 weeks after your surgery.  Make sure that you call for this appointment within a day or two after you arrive home to insure a convenient appointment time. OTHER INSTRUCTIONS: __________________________________________________________________________________________________________________________ __________________________________________________________________________________________________________________________ WHEN TO CALL YOUR DOCTOR: Fever over 101.0 Inability to urinate Continued bleeding from incision. Increased pain, redness, or drainage from the incision. Increasing abdominal pain  The clinic staff is available to answer your questions during regular business hours.  Please don't hesitate to call and ask to speak to one of the nurses for clinical concerns.  If you have a medical emergency, go to the nearest emergency room or call 911.  A surgeon from [facility] is always on call at the hospital. [address], [place], [place]  [address] ? [address] [address], [place], [place]   [address] [phone] ? [phone] ?  [phone]  [phone] Web site: [email or web address]     Managing Your Pain After Surgery Without Opioids    Thank you for participating in our program to help patients manage their pain after surgery without opioids. This is part of our effort to provide you with the best care possible, without exposing you or your family to the risk that opioids pose.  What pain can I expect after surgery? You can expect to have some pain after surgery. This is normal. The pain is typically worse the day after surgery, and quickly begins to get better. Many studies have found that many patients are able to manage their pain after surgery with Over-the-Counter (OTC) medications such as Tylenol and Motrin. If you have a condition that does not allow you to take Tylenol or Motrin, notify your surgical team.  How will I manage my pain? The best strategy for controlling your pain after surgery is around the clock pain control with Tylenol (acetaminophen) and Motrin (ibuprofen or Advil). Alternating these medications with each other allows you to maximize your pain control. In addition to Tylenol and Motrin, you can use heating pads or ice packs on your incisions to help reduce your pain.  How will I alternate your regular strength over-the-counter pain medication? You will take a dose of pain medication every three hours. Start by taking 650 mg of Tylenol (2 pills of 325 mg) 3 hours later take 600 mg of Motrin (3 pills of 200 mg) 3 hours after taking the Motrin take 650 mg of Tylenol 3 hours after that take 600 mg of Motrin.   - 1 -  See example - if your first dose of Tylenol is at 12:00 PM   12:00 PM Tylenol 650 mg (2 pills of 325 mg)  3:00 PM Motrin 600 mg (3 pills of 200 mg)  6:00 PM Tylenol 650 mg (2 pills of 325 mg)  9:00 PM Motrin 600 mg (3 pills of 200 mg)  Continue alternating every 3 hours   We recommend that you follow this schedule around-the-clock for at least 3 days after surgery, or  until you feel that it is no longer needed. Use the table on the last page of this handout to keep track of the medications you are taking. Important: Do not take more than 3000mg  of Tylenol or 3200mg  of Motrin in a 24-hour period. Do not take ibuprofen/Motrin if you have a history of bleeding stomach ulcers, severe kidney disease, &/or actively taking a blood thinner  What if I still have pain? If you have pain that is not controlled with the over-the-counter pain medications (Tylenol and Motrin or Advil) you might have what we call "breakthrough" pain. You will receive a prescription for a small amount of an opioid pain medication such as Oxycodone, Tramadol, or Tylenol with Codeine. Use these opioid pills in the first 24 hours after surgery if you have breakthrough pain. Do not take more than 1 pill every 4-6 hours.  If you still have uncontrolled pain after using all opioid pills, don't hesitate to call our staff using the number provided. We will help make sure you are managing your pain in the best way possible, and if necessary, we can provide a prescription for additional pain medication.   Day 1    Time  Name of Medication Number of pills taken  Amount of Acetaminophen  Pain Level   Comments  AM PM       AM PM  AM PM       AM PM       AM PM       AM PM       AM PM       AM PM       Total Daily amount of Acetaminophen Do not take more than  3,000 mg per day      Day 2    Time  Name of Medication Number of pills taken  Amount of Acetaminophen  Pain Level   Comments  AM PM       AM PM       AM PM       AM PM       AM PM       AM PM       AM PM       AM PM       Total Daily amount of Acetaminophen Do not take more than  3,000 mg per day      Day 3    Time  Name of Medication Number of pills taken  Amount of Acetaminophen  Pain Level   Comments  AM PM       AM PM       AM PM       AM PM         AM PM       AM PM       AM PM       AM PM        Total Daily amount of Acetaminophen Do not take more than  3,000 mg per day      Day 4    Time  Name of Medication Number of pills taken  Amount of Acetaminophen  Pain Level   Comments  AM PM       AM PM       AM PM       AM PM       AM PM       AM PM       AM PM       AM PM       Total Daily amount of Acetaminophen Do not take more than  3,000 mg per day      Day 5    Time  Name of Medication Number of pills taken  Amount of Acetaminophen  Pain Level   Comments  AM PM       AM PM       AM PM       AM PM       AM PM       AM PM       AM PM       AM PM       Total Daily amount of Acetaminophen Do not take more than  3,000 mg per day      Day 6    Time  Name of Medication Number of pills taken  Amount of Acetaminophen  Pain Level  Comments  AM PM       AM PM       AM PM       AM PM       AM PM       AM PM       AM PM       AM PM       Total Daily amount of Acetaminophen  Do not take more than  3,000 mg per day      Day 7    Time  Name of Medication Number of pills taken  Amount of Acetaminophen  Pain Level   Comments  AM PM       AM PM       AM PM       AM PM       AM PM       AM PM       AM PM       AM PM       Total Daily amount of Acetaminophen Do not take more than  3,000 mg per day        For additional information about how and where to safely dispose of unused opioid medications - [email or web address]  Disclaimer: This document contains information and/or instructional materials adapted from [facility] for the typical patient with your condition. It does not replace medical advice from your health care provider because your experience may differ from that of the typical patient. Talk to your health care provider if you have any questions about this document, your condition or your treatment plan. Adapted from [facility]

## 2024-01-11 NOTE — Consult Note (Signed)
Kenneth Benson 1960/12/19  [id number].    Requesting MD: Dr. Marily Memos Chief Complaint/Reason for Consult: gallstones, elevated LFTs  HPI:  Kenneth Benson is a 63 yo male with a history of a-fib (on Coumadin), CHF (EF 40-45%), CAD and COPD who presented to the ED with lower chest/epigastric pain. He has been having this pain intermittently for the last few weeks, and was recently admitted with a COPD exacerbation and a-fib with RVR. LFTs were normal during that admission. He now has elevated transaminases (AST/ALT 171/597) and Tbili of 3.7. He has noticed his urine is much darker than usual. A RUQ US showed gallstones, with a CBD diameter of 7mm. General surgery was consulted.  He has not had any prior abdominal surgeries. He states he is supposed to be on oxygen for his COPD but only uses it as needed.  ROS: Review of Systems  Constitutional:  Negative for chills and fever.  Cardiovascular:  Positive for chest pain.  Gastrointestinal:  Positive for nausea. Negative for vomiting.    Family History  Problem Relation Age of Onset   Heart disease Mother    Atrial fibrillation Father     Past Medical History:  Diagnosis Date   A-fib (HCC) 02/06/2022   AKI (acute kidney injury) (HCC) 12/17/2022   Arrhythmia    atrial fibrillation   Asthma    CHF (congestive heart failure) (HCC)    COPD (chronic obstructive pulmonary disease) (HCC)    Hyperkalemia 12/17/2022   Hypokalemia 04/13/2023   Myocardial injury 04/13/2023   Tobacco abuse     History reviewed. No pertinent surgical history.  Social History:  reports that he has been smoking cigarettes. He has a 40.5 pack-year smoking history. He does not have any smokeless tobacco history on file. He reports that he does not drink alcohol and does not use drugs.  Allergies: No Known Allergies  (Not in a hospital admission)    Physical Exam: Blood pressure 103/75, pulse 86, temperature 98.4 F (36.9 C), temperature source Oral,  resp. rate (!) 21, height 6' (1.829 m), weight 127 kg, SpO2 95%. General: resting comfortably, appears stated age, no apparent distress Neurological: alert and oriented, no focal deficits HEENT: normocephalic, atraumatic CV: regular rate and rhythm Respiratory: normal work of breathing on room air Abdomen: soft, nondistended, mild epigastric tenderness to palpation. No masses or organomegaly. Extremities: warm and well-perfused, no deformities, moving all extremities spontaneously Psychiatric: normal mood and affect Skin: warm and dry, no jaundice   Results for orders placed or performed during the hospital encounter of 01/10/24 (from the past 48 hours)  Comprehensive metabolic panel     Status: Abnormal   Collection Time: 01/10/24  6:39 PM  Result Value Ref Range   Sodium 136 135 - 145 mmol/L   Potassium 4.2 3.5 - 5.1 mmol/L   Chloride 101 98 - 111 mmol/L   CO2 25 22 - 32 mmol/L   Glucose, Bld 121 (H) 70 - 99 mg/dL    Comment: Glucose reference range applies only to samples taken after fasting for at least 8 hours.   BUN 15 8 - 23 mg/dL   Creatinine, Ser 4.09 0.61 - 1.24 mg/dL   Calcium 8.5 (L) 8.9 - 10.3 mg/dL   Total Protein 6.8 6.5 - 8.1 g/dL   Albumin 3.2 (L) 3.5 - 5.0 g/dL   AST 811 (H) 15 - 41 U/L   ALT 597 (H) 0 - 44 U/L   Alkaline Phosphatase 185 (H) 38 - 126  U/L   Total Bilirubin 3.7 (H) 0.0 - 1.2 mg/dL   GFR, Estimated >40 >98 mL/min    Comment: (NOTE) Calculated using the CKD-EPI Creatinine Equation (2021)    Anion gap 10 5 - 15    Comment: Performed at [facility], [address] [place], [place] [address]  Lipase, blood     Status: None   Collection Time: 01/10/24  6:39 PM  Result Value Ref Range   Lipase 28 11 - 51 U/L    Comment: Performed at [facility], [address], [place], [place] [address]  CBC with Differential     Status: Abnormal   Collection Time: 01/10/24  6:39 PM  Result Value Ref Range   WBC 7.5 4.0 - 10.5 K/uL   RBC 5.16  4.22 - 5.81 MIL/uL   Hemoglobin 15.9 13.0 - 17.0 g/dL   HCT 62.1 30.8 - 65.7 %   MCV 93.0 80.0 - 100.0 fL   MCH 30.8 26.0 - 34.0 pg   MCHC 33.1 30.0 - 36.0 g/dL   RDW 84.6 96.2 - 95.2 %   Platelets 258 150 - 400 K/uL   nRBC 0.0 0.0 - 0.2 %   Neutrophils Relative % 72 %   Neutro Abs 5.4 1.7 - 7.7 K/uL   Lymphocytes Relative 15 %   Lymphs Abs 1.1 0.7 - 4.0 K/uL   Monocytes Relative 7 %   Monocytes Absolute 0.5 0.1 - 1.0 K/uL   Eosinophils Relative 3 %   Eosinophils Absolute 0.2 0.0 - 0.5 K/uL   Basophils Relative 1 %   Basophils Absolute 0.0 0.0 - 0.1 K/uL   Immature Granulocytes 2 %   Abs Immature Granulocytes 0.12 (H) 0.00 - 0.07 K/uL    Comment: Performed at [facility], [address] [place], [place] [address]  CBC     Status: None   Collection Time: 01/11/24  5:34 AM  Result Value Ref Range   WBC 6.5 4.0 - 10.5 K/uL   RBC 4.82 4.22 - 5.81 MIL/uL   Hemoglobin 14.8 13.0 - 17.0 g/dL   HCT 44.0 10.2 - 72.5 %   MCV 93.4 80.0 - 100.0 fL   MCH 30.7 26.0 - 34.0 pg   MCHC 32.9 30.0 - 36.0 g/dL   RDW 36.6 44.0 - 34.7 %   Platelets 211 150 - 400 K/uL   nRBC 0.0 0.0 - 0.2 %    Comment: Performed at [facility], [address], [place], [place] [address]   US Abdomen Limited RUQ (LIVER/GB) Result Date: 01/11/2024 CLINICAL DATA:  Abdominal pain EXAM: ULTRASOUND ABDOMEN LIMITED RIGHT UPPER QUADRANT COMPARISON:  12/26/2023 FINDINGS: Gallbladder: Contracted gallbladder with 1 cm shadowing stone. No over distension or pericholecystic edema. Common bile duct: Diameter: 7 mm, upper normal.  Where visualized, no filling defect. Liver: No focal lesion identified. Within normal limits in parenchymal echogenicity. Portal vein is patent on color Doppler imaging with normal direction of blood flow towards the liver. Other: Cyst at the lower pole right kidney which appears simple, known from prior abdominal CT. IMPRESSION: Gallstone within the contracted gallbladder. No acute finding.  Electronically Signed   By: Tiburcio Pea M.D.   On: 01/11/2024 04:23      Assessment/Plan 63 yo male with COPD, CAD, CHF and a-fib presenting with epigastric pain and elevated LFTs. In setting of gallstones, this is suspicious for choledocholithiasis. Tbili is increasing today to 5.6. Proceed with MRCP, and if there  is choledocholithiasis, patient will need a GI consult for ERCP. Will discuss cholecystectomy following clearance of the bile duct, if patient is medically stable. - Hold Eliquis - MRCP pending - Trend LFTs - Surgery will follow   Sophronia Simas, MD [facility] General, Hepatobiliary and Pancreatic Surgery 01/11/24 6:06 AM

## 2024-01-11 NOTE — H&P (Addendum)
History and Physical    Kenneth Benson ZOX:[id number] DOB: 05/05/1961 DOA: 01/10/2024  PCP: Pcp, No   Patient coming from: Unfortunately patient is homeless   Chief Complaint: Right-sided abdominal pain,  midepigastric pain with radiation to the right sided lower rib cage.  ED TRIAGE note:  The pt has been a pt at [facility] he was admitted the last was jan 26  all his records are in [facility]  the pt reports that he is home less       HPI:  Kenneth Benson is a 63 y.o. male with medical history significant of paroxysmal  atrial fibrillation, CHF with reduced EF 40 to 45%,  coronary artery disease, COPD, vitamin D deficiency and vitamin B12 deficiency presented to emergency department complaint of midepigastric pain, right sided upper quadrant pain and flank pain.  Patient reported that he has midepigastric abdominal pain, right sided upper quadrant pain radiation to the right sided lower chest rib cage area which has been ongoing for 3 weeks.  Patient also reported associated nausea and vomiting.  Denies any fever and chills.  Patient reported that unfortunately he is homeless and unable to find any shelter home at this time and there is no shelter home wants to take him given patient has multiple comorbidities and history of COPD.  He is requesting for long-term placement.  Patient was discharged from the hospital 01/01/2024.  He was admitted for COPD exacerbation.  Also patient developed A-fib RVR treated with IV Cardizem afterwards switch to oral Cardizem alongside with oral metoprolol, digoxin and amiodarone.    ED Course:  At presentation to ED patient is hemodynamically stable. CBC unremarkable. CMP unremarkable except low albumin 3.2, elevated AST 171, ALT 597, alkaline phosphatase 195 and elevated bilirubin 3.7. Normal lipase level 28.  Hepatic ultrasound gallstones within the contracted gallbladder.  No acute finding.  Given patient has cholelithiasis Dr.  Clayborne Dana spoke with general surgery Dr. Mitzi Davenport who deferred any emergent surgery at this point however recommended to monitor serial hepatic panel and will evaluate patient in the morning for further recommendations/imaging.  Hospitalist has been contacted for further evaluation and management of cholelithiasis and transaminitis secondary to cholelithiasis.  Significant labs in the ED: Lab Orders         Comprehensive metabolic panel         Lipase, blood         CBC with Differential         Hepatitis panel, acute         CBC         Comprehensive metabolic panel       Review of Systems:  Review of Systems  Constitutional:  Negative for chills, fever, malaise/fatigue and weight loss.  Respiratory:  Positive for cough. Negative for sputum production and shortness of breath.   Cardiovascular:  Negative for chest pain, palpitations, orthopnea and leg swelling.  Gastrointestinal:  Positive for abdominal pain, nausea and vomiting. Negative for constipation, diarrhea and heartburn.  Genitourinary:  Negative for dysuria, frequency and urgency.       Concentrated urine  Musculoskeletal:  Negative for back pain and myalgias.  Neurological:  Negative for dizziness and headaches.  Endo/Heme/Allergies:  Negative for environmental allergies. Does not bruise/bleed easily.  Psychiatric/Behavioral:  The patient is not nervous/anxious.     Past Medical History:  Diagnosis Date   A-fib (HCC) 02/06/2022   AKI (acute kidney injury) (HCC) 12/17/2022   Arrhythmia    atrial fibrillation  Asthma    CHF (congestive heart failure) (HCC)    COPD (chronic obstructive pulmonary disease) (HCC)    Hyperkalemia 12/17/2022   Hypokalemia 04/13/2023   Myocardial injury 04/13/2023   Tobacco abuse     History reviewed. No pertinent surgical history.   reports that he has been smoking cigarettes. He has a 40.5 pack-year smoking history. He does not have any smokeless tobacco history on file. He reports that  he does not drink alcohol and does not use drugs.  No Known Allergies  Family History  Problem Relation Age of Onset   Heart disease Mother    Atrial fibrillation Father     Prior to Admission medications   Medication Sig Start Date End Date Taking? Authorizing Provider  albuterol (PROVENTIL) (2.5 MG/3ML) 0.083% nebulizer solution Take 3 mLs (2.5 mg total) by nebulization every 6 (six) hours as needed for wheezing or shortness of breath. 07/10/23 12/26/23  Enedina Finner, MD  albuterol (VENTOLIN HFA) 108 (90 Base) MCG/ACT inhaler Inhale 2 puffs into the lungs every 6 (six) hours as needed for wheezing or shortness of breath. 07/10/23   Enedina Finner, MD  amiodarone (PACERONE) 200 MG tablet Take 1 tablet (200 mg total) by mouth daily. 01/14/24   Gillis Santa, MD  amiodarone (PACERONE) 200 MG tablet Take 2 tablets (400 mg total) by mouth 2 (two) times daily for 4 days, THEN 1 tablet (200 mg total) 2 (two) times daily for 10 days, THEN 1 tablet (200 mg total) daily. 01/01/24 03/31/24  Gillis Santa, MD  apixaban (ELIQUIS) 5 MG TABS tablet Take 1 tablet (5 mg total) by mouth 2 (two) times daily. Patient not taking: Reported on 12/26/2023 07/10/23   Enedina Finner, MD  arformoterol [facility]) 15 MCG/2ML NEBU Take 2 mLs (15 mcg total) by nebulization 2 (two) times daily. Patient not taking: Reported on 12/26/2023 07/10/23 12/21/23  Enedina Finner, MD  cyanocobalamin 1000 MCG tablet Take 1 tablet (1,000 mcg total) by mouth daily. 01/05/24 04/04/24  Gillis Santa, MD  digoxin (LANOXIN) 0.125 MG tablet Take 1 tablet (0.125 mg total) by mouth daily. Patient not taking: Reported on 12/26/2023 09/02/23 12/21/23  Charise Killian, MD  diltiazem (CARDIZEM CD) 180 MG 24 hr capsule Take 1 capsule (180 mg total) by mouth daily. Patient not taking: Reported on 12/26/2023 09/02/23 12/21/23  Charise Killian, MD  JARDIANCE 10 MG TABS tablet Take 1 tablet (10 mg total) by mouth daily. Patient not taking: Reported on 12/26/2023 07/10/23    Enedina Finner, MD  metoprolol tartrate (LOPRESSOR) 50 MG tablet Take 1 tablet (50 mg total) by mouth 2 (two) times daily. Patient not taking: Reported on 12/26/2023 09/01/23 12/21/23  Charise Killian, MD  spironolactone (ALDACTONE) 25 MG tablet Take 0.5 tablets (12.5 mg total) by mouth daily. Hold this medication until you see your cardiologist Patient not taking: Reported on 12/26/2023 09/01/23   Charise Killian, MD  torsemide (DEMADEX) 20 MG tablet Take 2 tablets (40 mg total) by mouth daily. Hold this medication until you see your cardiologist Patient not taking: Reported on 12/26/2023 09/01/23 12/21/23  Charise Killian, MD  Vitamin D, Ergocalciferol, (DRISDOL) 1.25 MG (50000 UNIT) CAPS capsule Take 1 capsule (50,000 Units total) by mouth every 7 (seven) days. 01/05/24 04/04/24  Gillis Santa, MD     Physical Exam: Vitals:   01/10/24 1727 01/10/24 1828 01/11/24 0254 01/11/24 0419  BP: 115/80  107/73 103/75  Pulse: 80  84 86  Resp: 16  16 (!)  21  Temp: 97.7 F (36.5 C)  98.4 F (36.9 C)   TempSrc: Oral  Oral   SpO2: 97%  94% 95%  Weight:  127 kg    Height:  6' (1.829 m)      Physical Exam Vitals and nursing note reviewed.  Constitutional:      Appearance: He is ill-appearing.  Cardiovascular:     Rate and Rhythm: Normal rate and regular rhythm.     Heart sounds: Normal heart sounds.  Pulmonary:     Effort: Pulmonary effort is normal.     Breath sounds: Normal breath sounds. No wheezing, rhonchi or rales.  Abdominal:     General: Bowel sounds are normal.     Palpations: Abdomen is soft. There is no hepatomegaly or mass.     Tenderness: There is no abdominal tenderness. There is no guarding or rebound.  Musculoskeletal:     Cervical back: Neck supple.     Right lower leg: No edema.     Left lower leg: No edema.  Skin:    Capillary Refill: Capillary refill takes less than 2 seconds.  Neurological:     Mental Status: He is alert and oriented to person, place, and time.   Psychiatric:        Mood and Affect: Mood normal. Mood is not anxious.      Labs on Admission: I have personally reviewed following labs and imaging studies  CBC: Recent Labs  Lab 01/10/24 1839  WBC 7.5  NEUTROABS 5.4  HGB 15.9  HCT 48.0  MCV 93.0  PLT 258   Basic Metabolic Panel: Recent Labs  Lab 01/10/24 1839  NA 136  K 4.2  CL 101  CO2 25  GLUCOSE 121*  BUN 15  CREATININE 1.08  CALCIUM 8.5*   GFR: Estimated Creatinine Clearance: 99 mL/min (by C-G formula based on SCr of 1.08 mg/dL). Liver Function Tests: Recent Labs  Lab 01/10/24 1839  AST 171*  ALT 597*  ALKPHOS 185*  BILITOT 3.7*  PROT 6.8  ALBUMIN 3.2*   Recent Labs  Lab 01/10/24 1839  LIPASE 28   No results for input(s): "AMMONIA" in the last 168 hours. Coagulation Profile: No results for input(s): "INR", "PROTIME" in the last 168 hours. Cardiac Enzymes: No results for input(s): "CKTOTAL", "CKMB", "CKMBINDEX", "TROPONINI", "TROPONINIHS" in the last 168 hours. BNP (last 3 results) Recent Labs    04/13/23 1037 07/02/23 1543 12/21/23 1425  BNP 1,178.2* 661.0* 154.0*   HbA1C: No results for input(s): "HGBA1C" in the last 72 hours. CBG: No results for input(s): "GLUCAP" in the last 168 hours. Lipid Profile: No results for input(s): "CHOL", "HDL", "LDLCALC", "TRIG", "CHOLHDL", "LDLDIRECT" in the last 72 hours. Thyroid Function Tests: No results for input(s): "TSH", "T4TOTAL", "FREET4", "T3FREE", "THYROIDAB" in the last 72 hours. Anemia Panel: No results for input(s): "VITAMINB12", "FOLATE", "FERRITIN", "TIBC", "IRON", "RETICCTPCT" in the last 72 hours. Urine analysis:    Component Value Date/Time   COLORURINE YELLOW (A) 12/26/2023 0009   APPEARANCEUR CLEAR (A) 12/26/2023 0009   LABSPEC 1.040 (H) 12/26/2023 0009   PHURINE 5.0 12/26/2023 0009   GLUCOSEU >=500 (A) 12/26/2023 0009   HGBUR NEGATIVE 12/26/2023 0009   BILIRUBINUR NEGATIVE 12/26/2023 0009   KETONESUR NEGATIVE 12/26/2023  0009   PROTEINUR NEGATIVE 12/26/2023 0009   NITRITE NEGATIVE 12/26/2023 0009   LEUKOCYTESUR NEGATIVE 12/26/2023 0009    Radiological Exams on Admission: I have personally reviewed images US Abdomen Limited RUQ (LIVER/GB) Result Date: 01/11/2024 CLINICAL DATA:  Abdominal pain EXAM: ULTRASOUND ABDOMEN LIMITED RIGHT UPPER QUADRANT COMPARISON:  12/26/2023 FINDINGS: Gallbladder: Contracted gallbladder with 1 cm shadowing stone. No over distension or pericholecystic edema. Common bile duct: Diameter: 7 mm, upper normal.  Where visualized, no filling defect. Liver: No focal lesion identified. Within normal limits in parenchymal echogenicity. Portal vein is patent on color Doppler imaging with normal direction of blood flow towards the liver. Other: Cyst at the lower pole right kidney which appears simple, known from prior abdominal CT. IMPRESSION: Gallstone within the contracted gallbladder. No acute finding. Electronically Signed   By: Tiburcio Pea M.D.   On: 01/11/2024 04:23     EKG: My personal interpretation of EKG shows:      Assessment/Plan: Principal Problem:   Transaminitis Active Problems:   Paroxysmal atrial fibrillation (HCC)   Cholelithiasis   Chronic combined systolic and diastolic CHF (congestive heart failure) (HCC)   History of CAD (coronary artery disease)   History of COPD   Vitamin D deficiency   Vitamin B12 deficiency    Assessment and Plan: Transaminitis Cholelithiasis > Patient presented emergency department complaining of midepigastric pain, right upper quadrant abdominal pain and right-sided flank pain which has been ongoing for last few weeks.  Patient also reported associated nausea, vomiting, poor appetite and concentrated urine. -At presentation to ED patient is hemodynamically stable except borderline soft blood pressure.  Patient is afebrile.  No evidence of leukocytosis.  Normal lipase level.  CMP showed elevated AST 171, elevated ALT 597, elevated  alkaline phosphatase 185 and elevated total bilirubin 3.7.  Patient has history of chronically elevated ALT but never has been elevated like this before. - Workup and ultrasound showed gallstone with contracted gallbladder.  No evidence of acute cholecystitis. - General Surgery Dr. Freida Busman has been consulted and no urgent need for surgical intervention, recommended to monitor serial hepatic function panel and will evaluate patient in the morning for further recommendation/imaging - Obtaining MRCP to follow-up with the intrahepatic hepatic bile duct obstruction in the setting of significant transaminitis. - Checking hepatitis panel. - Holding amiodarone in the setting of significant transaminitis. -Continue Dilaudid 0.5 to 1 mg every 2 hour as needed for moderate and severe pain. Continue Zofran as needed.   Paroxysmal atrial fibrillation -Pending EKG. - Patient had recent episode of A-fib RVR 2 weeks ago when patient was treated with IV amiodarone drip afterward has been transition to oral amiodarone alongside with continuation of oral Cardizem, metoprolol and digoxin. - In the setting of significant transaminitis holding amiodarone.  If hepatic function remains stable in that case we will resume amiodarone. -In the setting of soft blood pressure holding Cardizem - Continue Eliquis 5 mg twice daily, metoprolol 50 mg twice daily and digoxin 0.125 mg daily.  Combined systolic diastolic heart reduced EF 40 to 45% -Patient blood pressure is borderline soft.  Euvolemic on physical exam - Holding spironolactone in the setting of soft blood pressure. - Continue metoprolol 50 mg twice daily and digoxin. -Holding Jardiance as patient is n.p.o. and obtaining MRCP.  History of CAD -Continue metoprolol 50 mg twice daily. -Holding Crestor in this setting of significant transaminitis.  History of COPD -Stable.  O2 sat 95% on room air. - Continue Breo Ellipta 1 puff daily, encouraged Ellipta 1 puff daily  and Xopenex as needed for any wheezing shortness of breath.  Vitamin deficiency -Continue oral vitamin D supplement.    DVT prophylaxis:  Eliquis Code Status:  Full Code Diet: Currently NPO for MRCP. Family Communication:  Family was present at bedside, at the time of interview.  Opportunity was given to ask question and all questions were answered satisfactorily.  Disposition Plan: Pending MRCP result.  If hepatic function remained stable in that case will resume amiodarone. Consults: General Surgery Admission status:   Inpatient, Telemetry bed  Severity of Illness: The appropriate patient status for this patient is INPATIENT. Inpatient status is judged to be reasonable and necessary in order to provide the required intensity of service to ensure the patient's safety. The patient's presenting symptoms, physical exam findings, and initial radiographic and laboratory data in the context of their chronic comorbidities is felt to place them at high risk for further clinical deterioration. Furthermore, it is not anticipated that the patient will be medically stable for discharge from the hospital within 2 midnights of admission.   * I certify that at the point of admission it is my clinical judgment that the patient will require inpatient hospital care spanning beyond 2 midnights from the point of admission due to high intensity of service, high risk for further deterioration and high frequency of surveillance required.Marland Kitchen    Tereasa Coop, MD [facility]  How to contact the [facility] Attending or Consulting provider 7A - 7P or covering provider during after hours 7P -7A, for this patient.  Check the care team in [facility] and look for a) attending/consulting [facility] provider listed and b) the [facility] team listed Log into [email or web address] and use [facility] universal password to access. If you do not have the password, please contact the hospital operator. Locate the [facility] provider you are looking for  under [facility] and page to a number that you can be directly reached. If you still have difficulty reaching the provider, please page the [facility] (Director on Call) for the Hospitalists listed on amion for assistance.  01/11/2024, 5:22 AM

## 2024-01-11 NOTE — ED Notes (Signed)
Patient transported to Ultrasound
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
## 2024-01-11 NOTE — Hospital Course (Signed)
Same day note  Kenneth Benson is a 63 y.o. 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  Patient was recently discharged from the hospital on 01/01/2024 after COPD exacerbation treatment and also had A-fib with RVR at that time which was treated with IV Cardizem and subsequently changed to oral Cardizem with metoprolol digoxin and amiodarone.  In the ED on this presentation patient was hemodynamically stable.  CBC unremarkable.  LFTs elevated at  elevated AST 171, ALT 597, alkaline phosphatase 195 and elevated bilirubin 3.7. Normal lipase level 28.  Ultrasound of the liver showed hepatic ultrasound gallstones within the contracted gallbladder.  Surgery was consulted and patient was considered for admission to the hospital for further evaluation and treatment.   Patient seen and examined at bedside.  Patient was admitted to the hospital for abdominal pain.  At the time of my evaluation, patient complains of  Physical examination reveals  Laboratory data and imaging was reviewed   Assessment and Plan:  Elevated LFT. Cholelithiasis Ultrasound sound of the right upper quadrant showed contracted gallbladder without acute cholecystitis.  General surgery on board.  Plan for MRCP. Holding amiodarone in the setting of significant transaminitis.    Paroxysmal atrial fibrillation Recent A-fib with RVR and was supposed to be on amiodarone and Cardizem digoxin as outpatient.  Amiodarone currently on hold due to elevated LFTs.  Continue Eliquis metoprolol and digoxin.  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  Hold Jardiance.  History of CAD -Continue metoprolol  50 mg twice daily.  Hold Crestor due to elevated LFT. -Holding Crestor in this setting of significant transaminitis.   History of COPD Continue stable on room air. - Continue Breo Ellipta 1 puff daily, encouraged Ellipta 1 puff daily and Xopenex as needed for any wheezing shortness of breath.   Vitamin D deficiency -Continue oral vitamin D supplement.    No Charge  Signed,  Tenny Craw, MD [facility]

## 2024-01-11 NOTE — Progress Notes (Signed)
CSW received consult for patient for homeless resources - resources were added to patient's AVS.  Edwin Dada, MSW, LCSW Transitions of Care  Clinical Social Worker II [phone]

## 2024-01-11 NOTE — ED Notes (Signed)
CCMD called.

## 2024-01-12 DIAGNOSIS — R7401 Elevation of levels of liver transaminase levels: Secondary | ICD-10-CM | POA: Diagnosis not present

## 2024-01-12 DIAGNOSIS — Z7901 Long term (current) use of anticoagulants: Secondary | ICD-10-CM

## 2024-01-12 DIAGNOSIS — K8051 Calculus of bile duct without cholangitis or cholecystitis with obstruction: Secondary | ICD-10-CM | POA: Diagnosis not present

## 2024-01-12 DIAGNOSIS — I480 Paroxysmal atrial fibrillation: Secondary | ICD-10-CM | POA: Diagnosis not present

## 2024-01-12 LAB — COMPREHENSIVE METABOLIC PANEL
ALT: 314 U/L — ABNORMAL HIGH (ref 0–44)
AST: 59 U/L — ABNORMAL HIGH (ref 15–41)
Albumin: 2.7 g/dL — ABNORMAL LOW (ref 3.5–5.0)
Alkaline Phosphatase: 149 U/L — ABNORMAL HIGH (ref 38–126)
Anion gap: 10 (ref 5–15)
BUN: 8 mg/dL (ref 8–23)
CO2: 28 mmol/L (ref 22–32)
Calcium: 8.3 mg/dL — ABNORMAL LOW (ref 8.9–10.3)
Chloride: 99 mmol/L (ref 98–111)
Creatinine, Ser: 0.89 mg/dL (ref 0.61–1.24)
GFR, Estimated: >60 mL/min (ref >60)
Glucose, Bld: 65 mg/dL — ABNORMAL LOW (ref 70–99)
Potassium: 4.7 mmol/L (ref 3.5–5.1)
Sodium: 137 mmol/L (ref 135–145)
Total Bilirubin: 1.9 mg/dL — ABNORMAL HIGH (ref 0.0–1.2)
Total Protein: 6 g/dL — ABNORMAL LOW (ref 6.5–8.1)

## 2024-01-12 LAB — CBC
HCT: 45 % (ref 39.0–52.0)
Hemoglobin: 14.2 g/dL (ref 13.0–17.0)
MCH: 30.3 pg (ref 26.0–34.0)
MCHC: 31.6 g/dL (ref 30.0–36.0)
MCV: 96.2 fL (ref 80.0–100.0)
Platelets: 208 10*3/uL (ref 150–400)
RBC: 4.68 MIL/uL (ref 4.22–5.81)
RDW: 14.7 % (ref 11.5–15.5)
WBC: 6.2 10*3/uL (ref 4.0–10.5)
nRBC: 0 % (ref 0.0–0.2)

## 2024-01-12 MED ORDER — SODIUM CHLORIDE 0.9 % IV SOLN
1.5000 g | Freq: Once | INTRAVENOUS | Status: AC
  Administered 2024-01-13: 1.5 g via INTRAVENOUS
  Filled 2024-01-12: qty 4

## 2024-01-12 NOTE — Plan of Care (Signed)
Problem: Activity: Goal: Risk for activity intolerance will decrease Outcome: Progressing   Problem: Nutrition: Goal: Adequate nutrition will be maintained Outcome: Progressing   Problem: Coping: Goal: Level of anxiety will decrease Outcome: Progressing   Problem: Elimination: Goal: Will not experience complications related to bowel motility Outcome: Progressing

## 2024-01-12 NOTE — H&P (View-Only) (Signed)
 Progress Note   Assessment    63 year old male with symptomatic choledocholithiasis, cholelithiasis, atrial fibrillation on Eliquis with last dose 10 AM on 01/11/2024, COPD, CHF and CAD  Principal Problem:   Transaminitis Active Problems:   Chronic combined systolic and diastolic CHF (congestive heart failure) (HCC)   Paroxysmal atrial fibrillation (HCC)   Cholelithiasis   History of CAD (coronary artery disease)   History of COPD   Vitamin D deficiency   Vitamin B12 deficiency   Abdominal pain   Recommendations   1.  Choledocholithiasis --no lab work today, need to repeat tomorrow -- ERCP tomorrow with general anesthesia with Dr. Elnoria Howard; I have reviewed the risk, benefits and alternatives to ERCP with the patient today and yesterday and he is agreeable and wishes to proceed -- General Surgery planning cholecystectomy after ERCP -- Periprocedural antibiotics and PR diclofenac or indomethacin ordered  2.  A-fib --hold Eliquis, 48-hour hold by tomorrow   Chief Complaint   Less abdominal pain today Tolerating regular diet Aware of ERCP tomorrow  Vital signs in last 24 hours: Temp:  [98 F (36.7 C)-98.6 F (37 C)] 98 F (36.7 C) (02/14 0449) Pulse Rate:  [59-88] 83 (02/14 1143) Resp:  [16-19] 18 (02/14 0449) BP: (91-124)/(61-81) 94/61 (02/14 1143) SpO2:  [97 %-100 %] 97 % (02/14 1143) Last BM Date : 01/10/24 Gen: awake, alert, NAD HEENT: anicteric  CV: RRR, no mrg Pulm: CTA b/l Abd: soft, NT/ND, +BS throughout Ext: no c/c/e Neuro: nonfocal  Intake/Output from previous day: No intake/output data recorded. Intake/Output this shift: Total I/O In: 177 [P.O.:177] Out: -   Lab Results: Recent Labs    01/10/24 1839 01/11/24 0534 01/12/24 0810  WBC 7.5 6.5 6.2  HGB 15.9 14.8 14.2  HCT 48.0 45.0 45.0  PLT 258 211 208   BMET Recent Labs    01/10/24 1839 01/11/24 0534  NA 136 136  K 4.2 3.9  CL 101 104  CO2 25 23  GLUCOSE 121* 128*  BUN 15 12   CREATININE 1.08 0.94  CALCIUM 8.5* 8.0*   LFT Recent Labs    01/11/24 0534  PROT 6.1*  ALBUMIN 2.8*  AST 127*  ALT 463*  ALKPHOS 159*  BILITOT 5.6*   PT/INR No results for input(s): "LABPROT", "INR" in the last 72 hours. Hepatitis Panel Recent Labs    01/11/24 1758  HEPBSAG NON REACTIVE  HCVAB NON REACTIVE  HEPAIGM NON REACTIVE  HEPBIGM NON REACTIVE    Studies/Results: MR ABDOMEN MRCP W WO CONTAST Result Date: 01/11/2024 CLINICAL DATA:  Three-week history of right upper quadrant pain associated with nausea and vomiting and elevated liver function tests EXAM: MRI ABDOMEN WITHOUT AND WITH CONTRAST (INCLUDING MRCP) TECHNIQUE: Multiplanar multisequence MR imaging of the abdomen was performed both before and after the administration of intravenous contrast. Heavily T2-weighted images of the biliary and pancreatic ducts were obtained, and three-dimensional MRCP images were rendered by post processing. CONTRAST:  10mL GADAVIST GADOBUTROL 1 MMOL/ML IV SOLN COMPARISON:  Same day right upper quadrant ultrasound examination FINDINGS: Lower chest: No acute findings. Hepatobiliary: No mass or other parenchymal abnormality identified. New mild intrahepatic bile duct dilation. Low and medial insertion of the cystic duct into the common bile duct. The common bile duct is mildly prominent measuring 7 mm in diameter with a 6 mm filling defect within the downstream common bile duct at the level of the ampulla. Markedly contracted gallbladder containing gallstones. Pancreas: No mass, inflammatory changes, or other parenchymal abnormality identified.  Spleen:  Enlarged spleen measures 13.6 cm, previously 12.3 cm Adrenals/Urinary Tract: No adrenal nodules. No suspicious renal masses identified. No evidence of hydronephrosis. 4.5 cm exophytic right lower pole simple cyst. No specific follow-up imaging recommended. Stomach/Bowel: Visualized portions within the abdomen are unremarkable. Vascular/Lymphatic: No  pathologically enlarged lymph nodes identified. No abdominal aortic aneurysm demonstrated. Other:  None. Musculoskeletal: No suspicious bone lesions identified. IMPRESSION: 1. Findings suspicious for choledocholithiasis with 6 mm stone in the downstream common bile duct near the ampulla. 2. Markedly contracted gallbladder containing gallstones. The cystic duct inserts low and medial into the common bile duct. 3. Mild splenomegaly. Electronically Signed   By: Agustin Cree M.D.   On: 01/11/2024 08:52   MR 3D Recon At Scanner Result Date: 01/11/2024 CLINICAL DATA:  Three-week history of right upper quadrant pain associated with nausea and vomiting and elevated liver function tests EXAM: MRI ABDOMEN WITHOUT AND WITH CONTRAST (INCLUDING MRCP) TECHNIQUE: Multiplanar multisequence MR imaging of the abdomen was performed both before and after the administration of intravenous contrast. Heavily T2-weighted images of the biliary and pancreatic ducts were obtained, and three-dimensional MRCP images were rendered by post processing. CONTRAST:  10mL GADAVIST GADOBUTROL 1 MMOL/ML IV SOLN COMPARISON:  Same day right upper quadrant ultrasound examination FINDINGS: Lower chest: No acute findings. Hepatobiliary: No mass or other parenchymal abnormality identified. New mild intrahepatic bile duct dilation. Low and medial insertion of the cystic duct into the common bile duct. The common bile duct is mildly prominent measuring 7 mm in diameter with a 6 mm filling defect within the downstream common bile duct at the level of the ampulla. Markedly contracted gallbladder containing gallstones. Pancreas: No mass, inflammatory changes, or other parenchymal abnormality identified. Spleen:  Enlarged spleen measures 13.6 cm, previously 12.3 cm Adrenals/Urinary Tract: No adrenal nodules. No suspicious renal masses identified. No evidence of hydronephrosis. 4.5 cm exophytic right lower pole simple cyst. No specific follow-up imaging recommended.  Stomach/Bowel: Visualized portions within the abdomen are unremarkable. Vascular/Lymphatic: No pathologically enlarged lymph nodes identified. No abdominal aortic aneurysm demonstrated. Other:  None. Musculoskeletal: No suspicious bone lesions identified. IMPRESSION: 1. Findings suspicious for choledocholithiasis with 6 mm stone in the downstream common bile duct near the ampulla. 2. Markedly contracted gallbladder containing gallstones. The cystic duct inserts low and medial into the common bile duct. 3. Mild splenomegaly. Electronically Signed   By: Agustin Cree M.D.   On: 01/11/2024 08:52   US Abdomen Limited RUQ (LIVER/GB) Result Date: 01/11/2024 CLINICAL DATA:  Abdominal pain EXAM: ULTRASOUND ABDOMEN LIMITED RIGHT UPPER QUADRANT COMPARISON:  12/26/2023 FINDINGS: Gallbladder: Contracted gallbladder with 1 cm shadowing stone. No over distension or pericholecystic edema. Common bile duct: Diameter: 7 mm, upper normal.  Where visualized, no filling defect. Liver: No focal lesion identified. Within normal limits in parenchymal echogenicity. Portal vein is patent on color Doppler imaging with normal direction of blood flow towards the liver. Other: Cyst at the lower pole right kidney which appears simple, known from prior abdominal CT. IMPRESSION: Gallstone within the contracted gallbladder. No acute finding. Electronically Signed   By: Tiburcio Pea M.D.   On: 01/11/2024 04:23      LOS: 1 day   Beverley Fiedler, MD 01/12/2024, 12:48 PM See Loretha Stapler, [facility], to contact our on call provider

## 2024-01-12 NOTE — Plan of Care (Signed)
  Problem: Education: Goal: Knowledge of General Education information will improve Description: Including pain rating scale, medication(s)/side effects and non-pharmacologic comfort measures Outcome: Progressing   Problem: Health Behavior/Discharge Planning: Goal: Ability to manage health-related needs will improve Outcome: Progressing   Problem: Nutrition: Goal: Adequate nutrition will be maintained Outcome: Progressing   Problem: Coping: Goal: Level of anxiety will decrease Outcome: Progressing   Problem: Elimination: Goal: Will not experience complications related to bowel motility Outcome: Progressing Goal: Will not experience complications related to urinary retention Outcome: Progressing   Problem: Pain Managment: Goal: General experience of comfort will improve and/or be controlled Outcome: Progressing   Problem: Safety: Goal: Ability to remain free from injury will improve Outcome: Progressing   Problem: Skin Integrity: Goal: Risk for impaired skin integrity will decrease Outcome: Progressing

## 2024-01-12 NOTE — ED Provider Notes (Signed)
[name] Provider Note   CSN: [id number] Arrival date & time: 01/10/24  1720     History  Chief Complaint  Patient presents with   Chest Pain    Kenneth Benson is a 63 y.o. male.  63 year old male who presents ER today secondary to epigastric pain.  Patient states that she had nausea.  Never anything really like this before.  He has episodes of chest pain in the past but does not really feel like that.  Has known gallstones but as far as he knows never needed any kind intervention for those.  Does not drink alcohol much.   Chest Pain      Home Medications Prior to Admission medications   Medication Sig Start Date End Date Taking? Authorizing Provider  albuterol (VENTOLIN HFA) 108 (90 Base) MCG/ACT inhaler Inhale 2 puffs into the lungs every 6 (six) hours as needed for wheezing or shortness of breath. 07/10/23  Yes Enedina Finner, MD  amiodarone (PACERONE) 200 MG tablet Take 2 tablets (400 mg total) by mouth 2 (two) times daily for 4 days, THEN 1 tablet (200 mg total) 2 (two) times daily for 10 days, THEN 1 tablet (200 mg total) daily. 01/01/24 03/31/24 Yes Gillis Santa, MD  apixaban (ELIQUIS) 5 MG TABS tablet Take 1 tablet (5 mg total) by mouth 2 (two) times daily. 07/10/23  Yes Enedina Finner, MD  cyanocobalamin 1000 MCG tablet Take 1 tablet (1,000 mcg total) by mouth daily. 01/05/24 04/04/24 Yes Gillis Santa, MD  digoxin (LANOXIN) 0.125 MG tablet Take 1 tablet (0.125 mg total) by mouth daily. 09/02/23 01/10/25 Yes Charise Killian, MD  diltiazem (CARDIZEM CD) 180 MG 24 hr capsule Take 1 capsule (180 mg total) by mouth daily. 09/02/23 01/10/25 Yes Charise Killian, MD  Fluticasone-Umeclidin-Vilant (TRELEGY ELLIPTA) 200-62.5-25 MCG/ACT AEPB Inhale 1 puff into the lungs daily.   Yes [provider]  JARDIANCE 10 MG TABS tablet Take 1 tablet (10 mg total) by mouth daily. 07/10/23  Yes Enedina Finner, MD  metoprolol tartrate (LOPRESSOR) 50 MG tablet Take 1 tablet (50  mg total) by mouth 2 (two) times daily. 09/01/23 01/10/25 Yes Charise Killian, MD  rosuvastatin (CRESTOR) 5 MG tablet Take 5 mg by mouth daily.   Yes [provider]  spironolactone (ALDACTONE) 25 MG tablet Take 0.5 tablets (12.5 mg total) by mouth daily. Hold this medication until you see your cardiologist Patient taking differently: Take 25 mg by mouth daily. Hold this medication until you see your cardiologist 09/01/23  Yes Charise Killian, MD  Vitamin D, Ergocalciferol, (DRISDOL) 1.25 MG (50000 UNIT) CAPS capsule Take 1 capsule (50,000 Units total) by mouth every 7 (seven) days. 01/05/24 04/04/24 Yes Gillis Santa, MD  albuterol (PROVENTIL) (2.5 MG/3ML) 0.083% nebulizer solution Take 3 mLs (2.5 mg total) by nebulization every 6 (six) hours as needed for wheezing or shortness of breath. Patient not taking: Reported on 01/11/2024 07/10/23 12/26/23  Enedina Finner, MD  amiodarone (PACERONE) 200 MG tablet Take 1 tablet (200 mg total) by mouth daily. Patient not taking: Reported on 01/11/2024 01/14/24   Gillis Santa, MD  arformoterol [facility]) 15 MCG/2ML NEBU Take 2 mLs (15 mcg total) by nebulization 2 (two) times daily. Patient not taking: Reported on 01/11/2024 07/10/23 12/21/23  Enedina Finner, MD      Allergies    Patient has no known allergies.    Review of Systems   Review of Systems  Cardiovascular:  Positive for chest pain.  Physical Exam Updated Vital Signs BP 101/64 (BP Location: Left Arm)   Pulse 78   Temp 98 F (36.7 C) (Oral)   Resp 18   Ht 6' (1.829 m)   Wt 127 kg   SpO2 99%   BMI 37.97 kg/m  Physical Exam Vitals and nursing note reviewed.  Constitutional:      Appearance: He is well-developed.  HENT:     Head: Normocephalic and atraumatic.  Cardiovascular:     Rate and Rhythm: Normal rate.  Pulmonary:     Effort: Pulmonary effort is normal. No respiratory distress.  Abdominal:     General: There is no distension.  Musculoskeletal:        General: Normal  range of motion.     Cervical back: Normal range of motion.  Neurological:     Mental Status: He is alert.     ED Results / Procedures / Treatments   Labs (all labs ordered are listed, but only abnormal results are displayed) Labs Reviewed  COMPREHENSIVE METABOLIC PANEL - Abnormal; Notable for the following components:      Result Value   Glucose, Bld 121 (*)    Calcium 8.5 (*)    Albumin 3.2 (*)    AST 171 (*)    ALT 597 (*)    Alkaline Phosphatase 185 (*)    Total Bilirubin 3.7 (*)    All other components within normal limits  CBC WITH DIFFERENTIAL/PLATELET - Abnormal; Notable for the following components:   Abs Immature Granulocytes 0.12 (*)    All other components within normal limits  COMPREHENSIVE METABOLIC PANEL - Abnormal; Notable for the following components:   Glucose, Bld 128 (*)    Calcium 8.0 (*)    Total Protein 6.1 (*)    Albumin 2.8 (*)    AST 127 (*)    ALT 463 (*)    Alkaline Phosphatase 159 (*)    Total Bilirubin 5.6 (*)    All other components within normal limits  LIPASE, BLOOD  HEPATITIS PANEL, ACUTE  CBC  CBC  COMPREHENSIVE METABOLIC PANEL    EKG None  Radiology MR ABDOMEN MRCP W WO CONTAST Result Date: 01/11/2024 CLINICAL DATA:  Three-week history of right upper quadrant pain associated with nausea and vomiting and elevated liver function tests EXAM: MRI ABDOMEN WITHOUT AND WITH CONTRAST (INCLUDING MRCP) TECHNIQUE: Multiplanar multisequence MR imaging of the abdomen was performed both before and after the administration of intravenous contrast. Heavily T2-weighted images of the biliary and pancreatic ducts were obtained, and three-dimensional MRCP images were rendered by post processing. CONTRAST:  10mL GADAVIST GADOBUTROL 1 MMOL/ML IV SOLN COMPARISON:  Same day right upper quadrant ultrasound examination FINDINGS: Lower chest: No acute findings. Hepatobiliary: No mass or other parenchymal abnormality identified. New mild intrahepatic bile duct  dilation. Low and medial insertion of the cystic duct into the common bile duct. The common bile duct is mildly prominent measuring 7 mm in diameter with a 6 mm filling defect within the downstream common bile duct at the level of the ampulla. Markedly contracted gallbladder containing gallstones. Pancreas: No mass, inflammatory changes, or other parenchymal abnormality identified. Spleen:  Enlarged spleen measures 13.6 cm, previously 12.3 cm Adrenals/Urinary Tract: No adrenal nodules. No suspicious renal masses identified. No evidence of hydronephrosis. 4.5 cm exophytic right lower pole simple cyst. No specific follow-up imaging recommended. Stomach/Bowel: Visualized portions within the abdomen are unremarkable. Vascular/Lymphatic: No pathologically enlarged lymph nodes identified. No abdominal aortic aneurysm demonstrated. Other:  None. Musculoskeletal:  No suspicious bone lesions identified. IMPRESSION: 1. Findings suspicious for choledocholithiasis with 6 mm stone in the downstream common bile duct near the ampulla. 2. Markedly contracted gallbladder containing gallstones. The cystic duct inserts low and medial into the common bile duct. 3. Mild splenomegaly. Electronically Signed   By: Agustin Cree M.D.   On: 01/11/2024 08:52   MR 3D Recon At Scanner Result Date: 01/11/2024 CLINICAL DATA:  Three-week history of right upper quadrant pain associated with nausea and vomiting and elevated liver function tests EXAM: MRI ABDOMEN WITHOUT AND WITH CONTRAST (INCLUDING MRCP) TECHNIQUE: Multiplanar multisequence MR imaging of the abdomen was performed both before and after the administration of intravenous contrast. Heavily T2-weighted images of the biliary and pancreatic ducts were obtained, and three-dimensional MRCP images were rendered by post processing. CONTRAST:  10mL GADAVIST GADOBUTROL 1 MMOL/ML IV SOLN COMPARISON:  Same day right upper quadrant ultrasound examination FINDINGS: Lower chest: No acute findings.  Hepatobiliary: No mass or other parenchymal abnormality identified. New mild intrahepatic bile duct dilation. Low and medial insertion of the cystic duct into the common bile duct. The common bile duct is mildly prominent measuring 7 mm in diameter with a 6 mm filling defect within the downstream common bile duct at the level of the ampulla. Markedly contracted gallbladder containing gallstones. Pancreas: No mass, inflammatory changes, or other parenchymal abnormality identified. Spleen:  Enlarged spleen measures 13.6 cm, previously 12.3 cm Adrenals/Urinary Tract: No adrenal nodules. No suspicious renal masses identified. No evidence of hydronephrosis. 4.5 cm exophytic right lower pole simple cyst. No specific follow-up imaging recommended. Stomach/Bowel: Visualized portions within the abdomen are unremarkable. Vascular/Lymphatic: No pathologically enlarged lymph nodes identified. No abdominal aortic aneurysm demonstrated. Other:  None. Musculoskeletal: No suspicious bone lesions identified. IMPRESSION: 1. Findings suspicious for choledocholithiasis with 6 mm stone in the downstream common bile duct near the ampulla. 2. Markedly contracted gallbladder containing gallstones. The cystic duct inserts low and medial into the common bile duct. 3. Mild splenomegaly. Electronically Signed   By: Agustin Cree M.D.   On: 01/11/2024 08:52   US Abdomen Limited RUQ (LIVER/GB) Result Date: 01/11/2024 CLINICAL DATA:  Abdominal pain EXAM: ULTRASOUND ABDOMEN LIMITED RIGHT UPPER QUADRANT COMPARISON:  12/26/2023 FINDINGS: Gallbladder: Contracted gallbladder with 1 cm shadowing stone. No over distension or pericholecystic edema. Common bile duct: Diameter: 7 mm, upper normal.  Where visualized, no filling defect. Liver: No focal lesion identified. Within normal limits in parenchymal echogenicity. Portal vein is patent on color Doppler imaging with normal direction of blood flow towards the liver. Other: Cyst at the lower pole right  kidney which appears simple, known from prior abdominal CT. IMPRESSION: Gallstone within the contracted gallbladder. No acute finding. Electronically Signed   By: Tiburcio Pea M.D.   On: 01/11/2024 04:23    Procedures Procedures    Medications Ordered in ED Medications  digoxin (LANOXIN) tablet 0.125 mg (0.125 mg Oral Given 01/11/24 1125)  metoprolol tartrate (LOPRESSOR) tablet 50 mg (50 mg Oral Given 01/11/24 2111)  fluticasone furoate-vilanterol (BREO ELLIPTA) 200-25 MCG/ACT 1 puff (1 puff Inhalation Not Given 01/11/24 1113)    And  umeclidinium bromide (INCRUSE ELLIPTA) 62.5 MCG/ACT 1 puff (1 puff Inhalation Not Given 01/11/24 1113)  sodium chloride flush (NS) 0.9 % injection 3 mL (3 mLs Intravenous Given 01/11/24 2115)  sodium chloride flush (NS) 0.9 % injection 3 mL (has no administration in time range)  0.9 %  sodium chloride infusion (has no administration in time range)  HYDROmorphone (DILAUDID) injection 0.5-1  mg (1 mg Intravenous Given 01/11/24 2111)  ondansetron (ZOFRAN) tablet 4 mg (has no administration in time range)    Or  ondansetron (ZOFRAN) injection 4 mg (has no administration in time range)  levalbuterol (XOPENEX) nebulizer solution 0.63 mg (0.63 mg Nebulization Given 01/11/24 1841)  arformoterol (BROVANA) nebulizer solution 15 mcg (15 mcg Nebulization Not Given 01/11/24 2018)  fentaNYL (SUBLIMAZE) injection 100 mcg (100 mcg Intravenous Given 01/11/24 0423)  gadobutrol (GADAVIST) 1 MMOL/ML injection 10 mL (10 mLs Intravenous Contrast Given 01/11/24 0754)    ED Course/ Medical Decision Making/ A&P                                 Medical Decision Making Amount and/or Complexity of Data Reviewed Radiology: ordered.  Risk Prescription drug management. Decision regarding hospitalization.   Patient found to have likely cholecystitis based on ultrasound but not all the findings.  With elevated LFTs I discussed with surgery, Dr. Freida Busman who wanted to keep him n.p.o. and off  his anticoagulation (last dose nearly 24 hours ago) and will see him later in the morning and trend LFTs.  Discussed with hospitalist regarding his recommendations.  Patient admitted to [facility] for further management.  Final Clinical Impression(s) / ED Diagnoses Final diagnoses:  Biliary colic    Rx / DC Orders ED Discharge Orders     None         Malary Aylesworth, Barbara Cower, MD 01/12/24 [phone]

## 2024-01-12 NOTE — Progress Notes (Signed)
Subjective/Chief Complaint: Pt with min pain Had eliquis yesterday AM   Objective: Vital signs in last 24 hours: Temp:  [98 F (36.7 C)-98.6 F (37 C)] 98 F (36.7 C) (02/14 0449) Pulse Rate:  [59-94] 78 (02/14 0449) Resp:  [16-25] 18 (02/14 0449) BP: (91-106)/(62-79) 101/64 (02/14 0449) SpO2:  [92 %-100 %] 99 % (02/14 0449) Last BM Date : 01/10/24  Intake/Output from previous day: No intake/output data recorded. Intake/Output this shift: No intake/output data recorded.  PE:  Constitutional: No acute distress, conversant, appears states age. Eyes: Anicteric sclerae, moist conjunctiva, no lid lag Lungs: Clear to auscultation bilaterally, normal respiratory effort CV: regular rate and rhythm, no murmurs, no peripheral edema, pedal pulses 2+ GI: Soft, no masses or hepatosplenomegaly, non-tender to palpation Skin: No rashes, palpation reveals normal turgor Psychiatric: appropriate judgment and insight, oriented to person, place, and time   Lab Results:  Recent Labs    01/10/24 1839 01/11/24 0534  WBC 7.5 6.5  HGB 15.9 14.8  HCT 48.0 45.0  PLT 258 211   BMET Recent Labs    01/10/24 1839 01/11/24 0534  NA 136 136  K 4.2 3.9  CL 101 104  CO2 25 23  GLUCOSE 121* 128*  BUN 15 12  CREATININE 1.08 0.94  CALCIUM 8.5* 8.0*   PT/INR No results for input(s): "LABPROT", "INR" in the last 72 hours. ABG No results for input(s): "PHART", "HCO3" in the last 72 hours.  Invalid input(s): "PCO2", "PO2"  Studies/Results: MR ABDOMEN MRCP W WO CONTAST Result Date: 01/11/2024 CLINICAL DATA:  Three-week history of right upper quadrant pain associated with nausea and vomiting and elevated liver function tests EXAM: MRI ABDOMEN WITHOUT AND WITH CONTRAST (INCLUDING MRCP) TECHNIQUE: Multiplanar multisequence MR imaging of the abdomen was performed both before and after the administration of intravenous contrast. Heavily T2-weighted images of the biliary and pancreatic ducts  were obtained, and three-dimensional MRCP images were rendered by post processing. CONTRAST:  10mL GADAVIST GADOBUTROL 1 MMOL/ML IV SOLN COMPARISON:  Same day right upper quadrant ultrasound examination FINDINGS: Lower chest: No acute findings. Hepatobiliary: No mass or other parenchymal abnormality identified. New mild intrahepatic bile duct dilation. Low and medial insertion of the cystic duct into the common bile duct. The common bile duct is mildly prominent measuring 7 mm in diameter with a 6 mm filling defect within the downstream common bile duct at the level of the ampulla. Markedly contracted gallbladder containing gallstones. Pancreas: No mass, inflammatory changes, or other parenchymal abnormality identified. Spleen:  Enlarged spleen measures 13.6 cm, previously 12.3 cm Adrenals/Urinary Tract: No adrenal nodules. No suspicious renal masses identified. No evidence of hydronephrosis. 4.5 cm exophytic right lower pole simple cyst. No specific follow-up imaging recommended. Stomach/Bowel: Visualized portions within the abdomen are unremarkable. Vascular/Lymphatic: No pathologically enlarged lymph nodes identified. No abdominal aortic aneurysm demonstrated. Other:  None. Musculoskeletal: No suspicious bone lesions identified. IMPRESSION: 1. Findings suspicious for choledocholithiasis with 6 mm stone in the downstream common bile duct near the ampulla. 2. Markedly contracted gallbladder containing gallstones. The cystic duct inserts low and medial into the common bile duct. 3. Mild splenomegaly. Electronically Signed   By: Agustin Cree M.D.   On: 01/11/2024 08:52   MR 3D Recon At Scanner Result Date: 01/11/2024 CLINICAL DATA:  Three-week history of right upper quadrant pain associated with nausea and vomiting and elevated liver function tests EXAM: MRI ABDOMEN WITHOUT AND WITH CONTRAST (INCLUDING MRCP) TECHNIQUE: Multiplanar multisequence MR imaging of the abdomen was performed  both before and after the  administration of intravenous contrast. Heavily T2-weighted images of the biliary and pancreatic ducts were obtained, and three-dimensional MRCP images were rendered by post processing. CONTRAST:  10mL GADAVIST GADOBUTROL 1 MMOL/ML IV SOLN COMPARISON:  Same day right upper quadrant ultrasound examination FINDINGS: Lower chest: No acute findings. Hepatobiliary: No mass or other parenchymal abnormality identified. New mild intrahepatic bile duct dilation. Low and medial insertion of the cystic duct into the common bile duct. The common bile duct is mildly prominent measuring 7 mm in diameter with a 6 mm filling defect within the downstream common bile duct at the level of the ampulla. Markedly contracted gallbladder containing gallstones. Pancreas: No mass, inflammatory changes, or other parenchymal abnormality identified. Spleen:  Enlarged spleen measures 13.6 cm, previously 12.3 cm Adrenals/Urinary Tract: No adrenal nodules. No suspicious renal masses identified. No evidence of hydronephrosis. 4.5 cm exophytic right lower pole simple cyst. No specific follow-up imaging recommended. Stomach/Bowel: Visualized portions within the abdomen are unremarkable. Vascular/Lymphatic: No pathologically enlarged lymph nodes identified. No abdominal aortic aneurysm demonstrated. Other:  None. Musculoskeletal: No suspicious bone lesions identified. IMPRESSION: 1. Findings suspicious for choledocholithiasis with 6 mm stone in the downstream common bile duct near the ampulla. 2. Markedly contracted gallbladder containing gallstones. The cystic duct inserts low and medial into the common bile duct. 3. Mild splenomegaly. Electronically Signed   By: Agustin Cree M.D.   On: 01/11/2024 08:52   US Abdomen Limited RUQ (LIVER/GB) Result Date: 01/11/2024 CLINICAL DATA:  Abdominal pain EXAM: ULTRASOUND ABDOMEN LIMITED RIGHT UPPER QUADRANT COMPARISON:  12/26/2023 FINDINGS: Gallbladder: Contracted gallbladder with 1 cm shadowing stone. No over  distension or pericholecystic edema. Common bile duct: Diameter: 7 mm, upper normal.  Where visualized, no filling defect. Liver: No focal lesion identified. Within normal limits in parenchymal echogenicity. Portal vein is patent on color Doppler imaging with normal direction of blood flow towards the liver. Other: Cyst at the lower pole right kidney which appears simple, known from prior abdominal CT. IMPRESSION: Gallstone within the contracted gallbladder. No acute finding. Electronically Signed   By: Tiburcio Pea M.D.   On: 01/11/2024 04:23    Anti-infectives: Anti-infectives (From admission, onward)    None       Assessment/Plan: 45M with CBD stones, cholelithiasis -Plan for OR Sat/Sun post ERCP -OK for PO  LOS: 1 day    Axel Filler 01/12/2024

## 2024-01-12 NOTE — Progress Notes (Signed)
PROGRESS NOTE  Kenneth Benson GNF:[id number] DOB: [birth year] DOA: 01/10/2024 PCP: Pcp, No   LOS: 1 day   Brief narrative:   Kenneth Benson is a 63 y.o. 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  Patient was recently discharged from the hospital on 01/01/2024 after COPD exacerbation treatment and also had A-fib with RVR at that time which was treated with IV Cardizem and subsequently changed to oral Cardizem with metoprolol digoxin and amiodarone.  In the ED on this presentation patient was hemodynamically stable.  CBC unremarkable.  LFTs elevated at  elevated AST 171, ALT 597, alkaline phosphatase 195 and elevated bilirubin 3.7. Normal lipase level 28.  Ultrasound of the liver showed hepatic ultrasound gallstones within the contracted gallbladder.  Surgery was consulted and patient was considered for admission to the hospital for further evaluation and treatment.     Assessment/Plan: Principal Problem:   Transaminitis Active Problems:   Paroxysmal atrial fibrillation (HCC)   Cholelithiasis   Chronic combined systolic and diastolic CHF (congestive heart failure) (HCC)   History of CAD (coronary artery disease)   History of COPD   Vitamin D deficiency   Vitamin B12 deficiency   Abdominal pain  Elevated LFT with Cholelithiasis Choledocholithiasis. Ultrasound sound of the right upper quadrant showed contracted gallbladder without acute cholecystitis.  General surgery on board and underwent MRCP with findings of choledocholithiasis with 6 mm stone in the common bile duct near the ampulla with multiple gallstones and contracted gallbladder.  GI has been consulted for possible ERCP.  Patient will need cholecystectomy  after ERCP.    Paroxysmal atrial fibrillation Recent A-fib with RVR and was supposed to be on amiodarone and Cardizem digoxin as outpatient.  Amiodarone currently on hold due to elevated LFTs.  Continue Eliquis metoprolol and digoxin.  Rate controlled at this time.  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  Hold Jardiance.  Appears compensated at this time.  History of CAD Continue metoprolol.  Hold statin for now.   History of COPD Stable.  Continue bronchodilators from home.  Vitamin D deficiency -Continue oral vitamin D supplement.  DVT prophylaxis: SCDs Start: 01/11/24 0455 Place TED hose Start: 01/11/24 0455   Disposition: Home likely in 2 to 3 days  Status is: Inpatient Remains inpatient appropriate because: Cholelithiasis, choledocholithiasis need for ERCP and cholecystectomy.    Code Status:     Code Status: Full Code  Family Communication: Spoke with the patient's spouse at bedside.  Consultants: GI General surgery  Procedures: MRCP  Anti-infectives:  None  Anti-infectives (From admission, onward)    None        Subjective: Today, patient was seen and examined at bedside.  Denies overt abdominal pain nausea or vomiting.  Last dose of Eliquis yesterday morning.  Denies any fever, chills or rigor.  Objective: Vitals:   01/12/24 0449 01/12/24 0856  BP: 101/64 124/81  Pulse: 78 84  Resp: 18   Temp: 98 F (36.7 C)   SpO2: 99% 98%    Intake/Output Summary (Last 24 hours) at 01/12/2024 1137 Last data filed at 01/12/2024 1006 Gross per 24 hour  Intake 177 ml  Output --  Net 177 ml  Filed Weights   01/10/24 1828  Weight: 127 kg   Body mass index is 37.97 kg/m.   Physical Exam:  GENERAL: Patient is alert awake and oriented. Not in obvious distress.  Obese built HENT: No scleral pallor or icterus. Pupils equally reactive to light. Oral mucosa is moist NECK: is supple, no  gross swelling noted. CHEST: Clear to auscultation. No crackles or wheezes.  Diminished breath sounds bilaterally. CVS: S1 and S2 heard, no murmur. Regular rate and rhythm.  ABDOMEN: Soft, non-tender, bowel sounds are present.  Obese abdomen EXTREMITIES: No edema. CNS: Cranial nerves are intact. No focal motor deficits. SKIN: warm and dry without rashes.  Data Review: I have personally reviewed the following laboratory data and studies,  CBC: Recent Labs  Lab 01/10/24 1839 01/11/24 0534  WBC 7.5 6.5  NEUTROABS 5.4  --   HGB 15.9 14.8  HCT 48.0 45.0  MCV 93.0 93.4  PLT 258 211   Basic Metabolic Panel: Recent Labs  Lab 01/10/24 1839 01/11/24 0534  NA 136 136  K 4.2 3.9  CL 101 104  CO2 25 23  GLUCOSE 121* 128*  BUN 15 12  CREATININE 1.08 0.94  CALCIUM 8.5* 8.0*   Liver Function Tests: Recent Labs  Lab 01/10/24 1839 01/11/24 0534  AST 171* 127*  ALT 597* 463*  ALKPHOS 185* 159*  BILITOT 3.7* 5.6*  PROT 6.8 6.1*  ALBUMIN 3.2* 2.8*   Recent Labs  Lab 01/10/24 1839  LIPASE 28   No results for input(s): "AMMONIA" in the last 168 hours. Cardiac Enzymes: No results for input(s): "CKTOTAL", "CKMB", "CKMBINDEX", "TROPONINI" in the last 168 hours. BNP (last 3 results) Recent Labs    04/13/23 1037 07/02/23 1543 12/21/23 1425  BNP 1,178.2* 661.0* 154.0*    ProBNP (last 3 results) No results for input(s): "PROBNP" in the last 8760 hours.  CBG: No results for input(s): "GLUCAP" in the last 168 hours. No results found for this or any previous visit (from the past 240 hours).   Studies: MR ABDOMEN MRCP W WO CONTAST Result Date: 01/11/2024 CLINICAL DATA:  Three-week history of right upper quadrant pain associated with nausea and vomiting and elevated liver function tests EXAM: MRI ABDOMEN WITHOUT AND WITH CONTRAST (INCLUDING MRCP) TECHNIQUE: Multiplanar multisequence MR imaging of the abdomen was performed both before and after the administration of intravenous  contrast. Heavily T2-weighted images of the biliary and pancreatic ducts were obtained, and three-dimensional MRCP images were rendered by post processing. CONTRAST:  10mL GADAVIST GADOBUTROL 1 MMOL/ML IV SOLN COMPARISON:  Same day right upper quadrant ultrasound examination FINDINGS: Lower chest: No acute findings. Hepatobiliary: No mass or other parenchymal abnormality identified. New mild intrahepatic bile duct dilation. Low and medial insertion of the cystic duct into the common bile duct. The common bile duct is mildly prominent measuring 7 mm in diameter with a 6 mm filling defect within the downstream common bile duct at the level of the ampulla. Markedly contracted gallbladder containing gallstones. Pancreas: No mass, inflammatory changes, or other parenchymal abnormality identified. Spleen:  Enlarged spleen measures 13.6 cm, previously 12.3 cm Adrenals/Urinary Tract: No adrenal nodules. No suspicious renal masses identified. No evidence of hydronephrosis. 4.5 cm exophytic right lower pole simple cyst. No specific follow-up imaging recommended. Stomach/Bowel: Visualized portions within the abdomen are unremarkable. Vascular/Lymphatic: No pathologically enlarged lymph nodes identified. No abdominal aortic aneurysm demonstrated. Other:  None. Musculoskeletal: No suspicious bone lesions identified. IMPRESSION: 1. Findings suspicious for choledocholithiasis with 6 mm stone in the  downstream common bile duct near the ampulla. 2. Markedly contracted gallbladder containing gallstones. The cystic duct inserts low and medial into the common bile duct. 3. Mild splenomegaly. Electronically Signed   By: Agustin Cree M.D.   On: 01/11/2024 08:52   MR 3D Recon At Scanner Result Date: 01/11/2024 CLINICAL DATA:  Three-week history of right upper quadrant pain associated with nausea and vomiting and elevated liver function tests EXAM: MRI ABDOMEN WITHOUT AND WITH CONTRAST (INCLUDING MRCP) TECHNIQUE: Multiplanar multisequence  MR imaging of the abdomen was performed both before and after the administration of intravenous contrast. Heavily T2-weighted images of the biliary and pancreatic ducts were obtained, and three-dimensional MRCP images were rendered by post processing. CONTRAST:  10mL GADAVIST GADOBUTROL 1 MMOL/ML IV SOLN COMPARISON:  Same day right upper quadrant ultrasound examination FINDINGS: Lower chest: No acute findings. Hepatobiliary: No mass or other parenchymal abnormality identified. New mild intrahepatic bile duct dilation. Low and medial insertion of the cystic duct into the common bile duct. The common bile duct is mildly prominent measuring 7 mm in diameter with a 6 mm filling defect within the downstream common bile duct at the level of the ampulla. Markedly contracted gallbladder containing gallstones. Pancreas: No mass, inflammatory changes, or other parenchymal abnormality identified. Spleen:  Enlarged spleen measures 13.6 cm, previously 12.3 cm Adrenals/Urinary Tract: No adrenal nodules. No suspicious renal masses identified. No evidence of hydronephrosis. 4.5 cm exophytic right lower pole simple cyst. No specific follow-up imaging recommended. Stomach/Bowel: Visualized portions within the abdomen are unremarkable. Vascular/Lymphatic: No pathologically enlarged lymph nodes identified. No abdominal aortic aneurysm demonstrated. Other:  None. Musculoskeletal: No suspicious bone lesions identified. IMPRESSION: 1. Findings suspicious for choledocholithiasis with 6 mm stone in the downstream common bile duct near the ampulla. 2. Markedly contracted gallbladder containing gallstones. The cystic duct inserts low and medial into the common bile duct. 3. Mild splenomegaly. Electronically Signed   By: Agustin Cree M.D.   On: 01/11/2024 08:52   US Abdomen Limited RUQ (LIVER/GB) Result Date: 01/11/2024 CLINICAL DATA:  Abdominal pain EXAM: ULTRASOUND ABDOMEN LIMITED RIGHT UPPER QUADRANT COMPARISON:  12/26/2023 FINDINGS:  Gallbladder: Contracted gallbladder with 1 cm shadowing stone. No over distension or pericholecystic edema. Common bile duct: Diameter: 7 mm, upper normal.  Where visualized, no filling defect. Liver: No focal lesion identified. Within normal limits in parenchymal echogenicity. Portal vein is patent on color Doppler imaging with normal direction of blood flow towards the liver. Other: Cyst at the lower pole right kidney which appears simple, known from prior abdominal CT. IMPRESSION: Gallstone within the contracted gallbladder. No acute finding. Electronically Signed   By: Tiburcio Pea M.D.   On: 01/11/2024 04:23      Joycelyn Das, MD  [facility] 01/12/2024  If 7PM-7AM, please contact night-coverage

## 2024-01-12 NOTE — Progress Notes (Signed)
Ted hose placed for b/l  lower extrimity per order

## 2024-01-12 NOTE — Progress Notes (Signed)
Progress Note   Assessment    63 year old male with symptomatic choledocholithiasis, cholelithiasis, atrial fibrillation on Eliquis with last dose 10 AM on 01/11/2024, COPD, CHF and CAD  Principal Problem:   Transaminitis Active Problems:   Chronic combined systolic and diastolic CHF (congestive heart failure) (HCC)   Paroxysmal atrial fibrillation (HCC)   Cholelithiasis   History of CAD (coronary artery disease)   History of COPD   Vitamin D deficiency   Vitamin B12 deficiency   Abdominal pain   Recommendations   1.  Choledocholithiasis --no lab work today, need to repeat tomorrow -- ERCP tomorrow with general anesthesia with Dr. Elnoria Howard; I have reviewed the risk, benefits and alternatives to ERCP with the patient today and yesterday and he is agreeable and wishes to proceed -- General Surgery planning cholecystectomy after ERCP -- Periprocedural antibiotics and PR diclofenac or indomethacin ordered  2.  A-fib --hold Eliquis, 48-hour hold by tomorrow   Chief Complaint   Less abdominal pain today Tolerating regular diet Aware of ERCP tomorrow  Vital signs in last 24 hours: Temp:  [98 F (36.7 C)-98.6 F (37 C)] 98 F (36.7 C) (02/14 0449) Pulse Rate:  [59-88] 83 (02/14 1143) Resp:  [16-19] 18 (02/14 0449) BP: (91-124)/(61-81) 94/61 (02/14 1143) SpO2:  [97 %-100 %] 97 % (02/14 1143) Last BM Date : 01/10/24 Gen: awake, alert, NAD HEENT: anicteric  CV: RRR, no mrg Pulm: CTA b/l Abd: soft, NT/ND, +BS throughout Ext: no c/c/e Neuro: nonfocal  Intake/Output from previous day: No intake/output data recorded. Intake/Output this shift: Total I/O In: 177 [P.O.:177] Out: -   Lab Results: Recent Labs    01/10/24 1839 01/11/24 0534 01/12/24 0810  WBC 7.5 6.5 6.2  HGB 15.9 14.8 14.2  HCT 48.0 45.0 45.0  PLT 258 211 208   BMET Recent Labs    01/10/24 1839 01/11/24 0534  NA 136 136  K 4.2 3.9  CL 101 104  CO2 25 23  GLUCOSE 121* 128*  BUN 15 12   CREATININE 1.08 0.94  CALCIUM 8.5* 8.0*   LFT Recent Labs    01/11/24 0534  PROT 6.1*  ALBUMIN 2.8*  AST 127*  ALT 463*  ALKPHOS 159*  BILITOT 5.6*   PT/INR No results for input(s): "LABPROT", "INR" in the last 72 hours. Hepatitis Panel Recent Labs    01/11/24 1758  HEPBSAG NON REACTIVE  HCVAB NON REACTIVE  HEPAIGM NON REACTIVE  HEPBIGM NON REACTIVE    Studies/Results: MR ABDOMEN MRCP W WO CONTAST Result Date: 01/11/2024 CLINICAL DATA:  Three-week history of right upper quadrant pain associated with nausea and vomiting and elevated liver function tests EXAM: MRI ABDOMEN WITHOUT AND WITH CONTRAST (INCLUDING MRCP) TECHNIQUE: Multiplanar multisequence MR imaging of the abdomen was performed both before and after the administration of intravenous contrast. Heavily T2-weighted images of the biliary and pancreatic ducts were obtained, and three-dimensional MRCP images were rendered by post processing. CONTRAST:  10mL GADAVIST GADOBUTROL 1 MMOL/ML IV SOLN COMPARISON:  Same day right upper quadrant ultrasound examination FINDINGS: Lower chest: No acute findings. Hepatobiliary: No mass or other parenchymal abnormality identified. New mild intrahepatic bile duct dilation. Low and medial insertion of the cystic duct into the common bile duct. The common bile duct is mildly prominent measuring 7 mm in diameter with a 6 mm filling defect within the downstream common bile duct at the level of the ampulla. Markedly contracted gallbladder containing gallstones. Pancreas: No mass, inflammatory changes, or other parenchymal abnormality identified.  Spleen:  Enlarged spleen measures 13.6 cm, previously 12.3 cm Adrenals/Urinary Tract: No adrenal nodules. No suspicious renal masses identified. No evidence of hydronephrosis. 4.5 cm exophytic right lower pole simple cyst. No specific follow-up imaging recommended. Stomach/Bowel: Visualized portions within the abdomen are unremarkable. Vascular/Lymphatic: No  pathologically enlarged lymph nodes identified. No abdominal aortic aneurysm demonstrated. Other:  None. Musculoskeletal: No suspicious bone lesions identified. IMPRESSION: 1. Findings suspicious for choledocholithiasis with 6 mm stone in the downstream common bile duct near the ampulla. 2. Markedly contracted gallbladder containing gallstones. The cystic duct inserts low and medial into the common bile duct. 3. Mild splenomegaly. Electronically Signed   By: Agustin Cree M.D.   On: 01/11/2024 08:52   MR 3D Recon At Scanner Result Date: 01/11/2024 CLINICAL DATA:  Three-week history of right upper quadrant pain associated with nausea and vomiting and elevated liver function tests EXAM: MRI ABDOMEN WITHOUT AND WITH CONTRAST (INCLUDING MRCP) TECHNIQUE: Multiplanar multisequence MR imaging of the abdomen was performed both before and after the administration of intravenous contrast. Heavily T2-weighted images of the biliary and pancreatic ducts were obtained, and three-dimensional MRCP images were rendered by post processing. CONTRAST:  10mL GADAVIST GADOBUTROL 1 MMOL/ML IV SOLN COMPARISON:  Same day right upper quadrant ultrasound examination FINDINGS: Lower chest: No acute findings. Hepatobiliary: No mass or other parenchymal abnormality identified. New mild intrahepatic bile duct dilation. Low and medial insertion of the cystic duct into the common bile duct. The common bile duct is mildly prominent measuring 7 mm in diameter with a 6 mm filling defect within the downstream common bile duct at the level of the ampulla. Markedly contracted gallbladder containing gallstones. Pancreas: No mass, inflammatory changes, or other parenchymal abnormality identified. Spleen:  Enlarged spleen measures 13.6 cm, previously 12.3 cm Adrenals/Urinary Tract: No adrenal nodules. No suspicious renal masses identified. No evidence of hydronephrosis. 4.5 cm exophytic right lower pole simple cyst. No specific follow-up imaging recommended.  Stomach/Bowel: Visualized portions within the abdomen are unremarkable. Vascular/Lymphatic: No pathologically enlarged lymph nodes identified. No abdominal aortic aneurysm demonstrated. Other:  None. Musculoskeletal: No suspicious bone lesions identified. IMPRESSION: 1. Findings suspicious for choledocholithiasis with 6 mm stone in the downstream common bile duct near the ampulla. 2. Markedly contracted gallbladder containing gallstones. The cystic duct inserts low and medial into the common bile duct. 3. Mild splenomegaly. Electronically Signed   By: Agustin Cree M.D.   On: 01/11/2024 08:52   US Abdomen Limited RUQ (LIVER/GB) Result Date: 01/11/2024 CLINICAL DATA:  Abdominal pain EXAM: ULTRASOUND ABDOMEN LIMITED RIGHT UPPER QUADRANT COMPARISON:  12/26/2023 FINDINGS: Gallbladder: Contracted gallbladder with 1 cm shadowing stone. No over distension or pericholecystic edema. Common bile duct: Diameter: 7 mm, upper normal.  Where visualized, no filling defect. Liver: No focal lesion identified. Within normal limits in parenchymal echogenicity. Portal vein is patent on color Doppler imaging with normal direction of blood flow towards the liver. Other: Cyst at the lower pole right kidney which appears simple, known from prior abdominal CT. IMPRESSION: Gallstone within the contracted gallbladder. No acute finding. Electronically Signed   By: Tiburcio Pea M.D.   On: 01/11/2024 04:23      LOS: 1 day   Beverley Fiedler, MD 01/12/2024, 12:48 PM See Loretha Stapler, [facility], to contact our on call provider

## 2024-01-13 ENCOUNTER — Inpatient Hospital Stay (HOSPITAL_COMMUNITY): Payer: Medicaid Other | Admitting: Anesthesiology

## 2024-01-13 ENCOUNTER — Inpatient Hospital Stay (HOSPITAL_COMMUNITY): Payer: Medicaid Other

## 2024-01-13 ENCOUNTER — Encounter (HOSPITAL_COMMUNITY)
Admission: EM | Disposition: A | Discharge status: Home / Self Care | Payer: Self-pay | Source: Home / Self Care | Attending: Student

## 2024-01-13 ENCOUNTER — Encounter (HOSPITAL_COMMUNITY): Payer: Self-pay | Admitting: Internal Medicine

## 2024-01-13 DIAGNOSIS — K8050 Calculus of bile duct without cholangitis or cholecystitis without obstruction: Secondary | ICD-10-CM | POA: Diagnosis not present

## 2024-01-13 DIAGNOSIS — I110 Hypertensive heart disease with heart failure: Secondary | ICD-10-CM

## 2024-01-13 DIAGNOSIS — R7401 Elevation of levels of liver transaminase levels: Secondary | ICD-10-CM | POA: Diagnosis not present

## 2024-01-13 DIAGNOSIS — R1013 Epigastric pain: Secondary | ICD-10-CM

## 2024-01-13 DIAGNOSIS — F17210 Nicotine dependence, cigarettes, uncomplicated: Secondary | ICD-10-CM

## 2024-01-13 DIAGNOSIS — K8071 Calculus of gallbladder and bile duct without cholecystitis with obstruction: Secondary | ICD-10-CM

## 2024-01-13 DIAGNOSIS — I5022 Chronic systolic (congestive) heart failure: Secondary | ICD-10-CM

## 2024-01-13 DIAGNOSIS — I5042 Chronic combined systolic (congestive) and diastolic (congestive) heart failure: Secondary | ICD-10-CM | POA: Diagnosis not present

## 2024-01-13 DIAGNOSIS — E538 Deficiency of other specified B group vitamins: Secondary | ICD-10-CM

## 2024-01-13 HISTORY — PX: REMOVAL OF STONES: SHX5545

## 2024-01-13 HISTORY — PX: SPHINCTEROTOMY: SHX5544

## 2024-01-13 HISTORY — PX: ERCP: SHX5425

## 2024-01-13 LAB — COMPREHENSIVE METABOLIC PANEL
ALT: 254 U/L — ABNORMAL HIGH (ref 0–44)
AST: 82 U/L — ABNORMAL HIGH (ref 15–41)
Albumin: 2.6 g/dL — ABNORMAL LOW (ref 3.5–5.0)
Alkaline Phosphatase: 177 U/L — ABNORMAL HIGH (ref 38–126)
Anion gap: 8 (ref 5–15)
BUN: 9 mg/dL (ref 8–23)
CO2: 30 mmol/L (ref 22–32)
Calcium: 8.5 mg/dL — ABNORMAL LOW (ref 8.9–10.3)
Chloride: 98 mmol/L (ref 98–111)
Creatinine, Ser: 0.94 mg/dL (ref 0.61–1.24)
GFR, Estimated: >60 mL/min (ref >60)
Glucose, Bld: 95 mg/dL (ref 70–99)
Potassium: 4.6 mmol/L (ref 3.5–5.1)
Sodium: 136 mmol/L (ref 135–145)
Total Bilirubin: 3.7 mg/dL — ABNORMAL HIGH (ref 0.0–1.2)
Total Protein: 6 g/dL — ABNORMAL LOW (ref 6.5–8.1)

## 2024-01-13 LAB — CBC
HCT: 43 % (ref 39.0–52.0)
Hemoglobin: 14 g/dL (ref 13.0–17.0)
MCH: 30.4 pg (ref 26.0–34.0)
MCHC: 32.6 g/dL (ref 30.0–36.0)
MCV: 93.5 fL (ref 80.0–100.0)
Platelets: 181 10*3/uL (ref 150–400)
RBC: 4.6 MIL/uL (ref 4.22–5.81)
RDW: 14.3 % (ref 11.5–15.5)
WBC: 4.9 10*3/uL (ref 4.0–10.5)
nRBC: 0 % (ref 0.0–0.2)

## 2024-01-13 LAB — MAGNESIUM: Magnesium: 1.8 mg/dL (ref 1.7–2.4)

## 2024-01-13 LAB — PHOSPHORUS: Phosphorus: 3.3 mg/dL (ref 2.5–4.6)

## 2024-01-13 LAB — GLUCOSE, CAPILLARY: Glucose-Capillary: 156 mg/dL — ABNORMAL HIGH (ref 70–99)

## 2024-01-13 SURGERY — ERCP, WITH INTERVENTION IF INDICATED
Anesthesia: General

## 2024-01-13 MED ORDER — SODIUM CHLORIDE 0.9 % IV SOLN: INTRAVENOUS | Status: DC | PRN

## 2024-01-13 MED ORDER — GLUCAGON HCL RDNA (DIAGNOSTIC) 1 MG IJ SOLR
INTRAMUSCULAR | Status: DC | PRN
  Administered 2024-01-13: .5 mg via INTRAVENOUS

## 2024-01-13 MED ORDER — ALBUTEROL SULFATE HFA 108 (90 BASE) MCG/ACT IN AERS
INHALATION_SPRAY | RESPIRATORY_TRACT | Status: DC | PRN
  Administered 2024-01-13: 2 via RESPIRATORY_TRACT
  Administered 2024-01-13: 4 via RESPIRATORY_TRACT

## 2024-01-13 MED ORDER — SODIUM CHLORIDE 0.9 % IV SOLN
INTRAVENOUS | Status: DC | PRN
  Administered 2024-01-13: 20 mL

## 2024-01-13 MED ORDER — PROPOFOL 10 MG/ML IV BOLUS
INTRAVENOUS | Status: DC | PRN
  Administered 2024-01-13: 80 mg via INTRAVENOUS

## 2024-01-13 MED ORDER — DICLOFENAC SUPPOSITORY 100 MG
RECTAL | Status: AC
Start: 2024-01-13 — End: ?
  Filled 2024-01-13: qty 1

## 2024-01-13 MED ORDER — PHENYLEPHRINE 80 MCG/ML (10ML) SYRINGE FOR IV PUSH (FOR BLOOD PRESSURE SUPPORT)
PREFILLED_SYRINGE | INTRAVENOUS | Status: DC | PRN
  Administered 2024-01-13: 80 ug via INTRAVENOUS
  Administered 2024-01-13 (×5): 160 ug via INTRAVENOUS

## 2024-01-13 MED ORDER — ROCURONIUM BROMIDE 10 MG/ML (PF) SYRINGE
PREFILLED_SYRINGE | INTRAVENOUS | Status: DC | PRN
  Administered 2024-01-13: 60 mg via INTRAVENOUS

## 2024-01-13 MED ORDER — SUGAMMADEX SODIUM 200 MG/2ML IV SOLN
INTRAVENOUS | Status: DC | PRN
  Administered 2024-01-13: 250 mg via INTRAVENOUS

## 2024-01-13 MED ORDER — FENTANYL CITRATE (PF) 250 MCG/5ML IJ SOLN
INTRAMUSCULAR | Status: DC | PRN
Start: 2024-01-13 — End: 2024-01-13
  Administered 2024-01-13: 50 ug via INTRAVENOUS

## 2024-01-13 MED ORDER — CEFAZOLIN SODIUM-DEXTROSE 3-4 GM/150ML-% IV SOLN
3.0000 g | INTRAVENOUS | Status: AC
  Administered 2024-01-14: 3 g via INTRAVENOUS

## 2024-01-13 MED ORDER — CHLORHEXIDINE GLUCONATE CLOTH 2 % EX PADS
6.0000 | MEDICATED_PAD | Freq: Once | CUTANEOUS | Status: AC
Start: 2024-01-13 — End: 2024-01-14
  Administered 2024-01-14: 6 via TOPICAL

## 2024-01-13 MED ORDER — DICLOFENAC SUPPOSITORY 100 MG
RECTAL | Status: DC | PRN
  Administered 2024-01-13: 100 mg via RECTAL

## 2024-01-13 MED ORDER — GLUCAGON HCL RDNA (DIAGNOSTIC) 1 MG IJ SOLR
INTRAMUSCULAR | Status: AC
  Filled 2024-01-13: qty 1

## 2024-01-13 MED ORDER — INDOCYANINE GREEN 25 MG IV SOLR
7.5000 mg | Freq: Once | INTRAVENOUS | Status: DC
Start: 2024-01-13 — End: 2024-01-14

## 2024-01-13 MED ORDER — LIDOCAINE 2% (20 MG/ML) 5 ML SYRINGE
INTRAMUSCULAR | Status: DC | PRN
  Administered 2024-01-13: 100 mg via INTRAVENOUS

## 2024-01-13 MED ORDER — DEXAMETHASONE SODIUM PHOSPHATE 10 MG/ML IJ SOLN
INTRAMUSCULAR | Status: DC | PRN
  Administered 2024-01-13: 10 mg via INTRAVENOUS

## 2024-01-13 MED ORDER — ENOXAPARIN SODIUM 40 MG/0.4ML IJ SOSY
40.0000 mg | PREFILLED_SYRINGE | Freq: Once | INTRAMUSCULAR | Status: AC
  Administered 2024-01-14: 40 mg via SUBCUTANEOUS
  Filled 2024-01-13: qty 0.4

## 2024-01-13 MED ORDER — FENTANYL CITRATE (PF) 100 MCG/2ML IJ SOLN
INTRAMUSCULAR | Status: AC
  Filled 2024-01-13: qty 2

## 2024-01-13 MED ORDER — ONDANSETRON HCL 4 MG/2ML IJ SOLN
INTRAMUSCULAR | Status: DC | PRN
  Administered 2024-01-13: 4 mg via INTRAVENOUS

## 2024-01-13 MED ORDER — CHLORHEXIDINE GLUCONATE CLOTH 2 % EX PADS
6.0000 | MEDICATED_PAD | Freq: Once | CUTANEOUS | Status: AC
  Administered 2024-01-13: 6 via TOPICAL

## 2024-01-13 NOTE — Anesthesia Procedure Notes (Signed)
Procedure Name: Intubation Date/Time: 01/13/2024 7:59 AM  Performed by: Carlos American, CRNAPre-anesthesia Checklist: Patient identified, Emergency Drugs available, Suction available and Patient being monitored Patient Re-evaluated:Patient Re-evaluated prior to induction Oxygen Delivery Method: Circle System Utilized Preoxygenation: Pre-oxygenation with 100% oxygen Induction Type: IV induction Ventilation: Mask ventilation without difficulty and Two handed mask ventilation required Laryngoscope Size: Mac and 4 Grade View: Grade I Tube type: Oral Number of attempts: 1 Airway Equipment and Method: Stylet Placement Confirmation: ETT inserted through vocal cords under direct vision, positive ETCO2 and breath sounds checked- equal and bilateral Secured at: 23 cm Tube secured with: Tape Dental Injury: Teeth and Oropharynx as per pre-operative assessment

## 2024-01-13 NOTE — Progress Notes (Signed)
PROGRESS NOTE  Kenneth Benson WUJ:[id number] DOB: 1961/07/30   PCP: Pcp, No  Patient is from: Home.  DOA: 01/10/2024 LOS: 2  Chief complaints Chief Complaint  Patient presents with   Chest Pain     Brief Narrative / Interim history: 63 year old M with PMH of HFmrEF, COPD, PAF on Eliquis and cholelithiasis presented to ED with RUQ, epigastric and right flank pain for about 3 weeks with associated nausea and vomiting, and admitted with transaminitis in the setting of cholelithiasis and choledocholithiasis as noted on MRCP.  Patient underwent ESRD on 2/15.  Plan for lap chole in 2/16.  Subjective: Seen and examined earlier this morning after he returned from ERCP.  No major events overnight of this morning.  No complaints but hungry.  Objective: Vitals:   01/13/24 0921 01/13/24 0930 01/13/24 0941 01/13/24 1002  BP: 109/72 109/79 114/81 108/64  Pulse: 96 89 84 77  Resp: (!) 24 12 19    Temp: 98.3 F (36.8 C)  98.2 F (36.8 C) 98 F (36.7 C)  TempSrc:    Oral  SpO2: 93% 92% 91% 94%  Weight:      Height:        Examination:  GENERAL: No apparent distress.  Nontoxic. HEENT: MMM.  Vision and hearing grossly intact.  NECK: Supple.  No apparent JVD.  RESP:  No IWOB.  Fair aeration bilaterally. CVS:  RRR. Heart sounds normal.  ABD/GI/GU: BS+. Abd soft, NTND.  MSK/EXT:  Moves extremities. No apparent deformity. No edema.  SKIN: no apparent skin lesion or wound NEURO: Awake, alert and oriented appropriately.  No apparent focal neuro deficit. PSYCH: Calm. Normal affect.   Procedures:  2/15-ERCP  Microbiology summarized: None  Assessment and plan: Elevated liver enzymes due to choledocholithiasis Cholelithiasis-noted on RUQ [place] and MRCP.  No cholecystitis. -MRCP positive for 6 mm choledocholithiasis. -S/p ERCP on 2/15.  LFT has already improved -Plan for lap chole on 2/16.  CLD now and n.p.o. after midnight  Paroxysmal atrial fibrillation: Rate controlled.  Recently  hospitalized for A-fib with RVR and started on amiodarone, Cardizem and digoxin.  He is on Eliquis for anticoagulation. -Continue holding amiodarone in the setting of elevated LFT -Continue holding Eliquis pending surgery -Continue digoxin and metoprolol 50 mg twice daily.  Cardizem on hold.  Chronic HFmrEF: TTE in 11/2022 with LVEF of 45 to 50%, moderate MR. Appears euvolemic on exam.  On Jardiance and Aldactone at home.  He is on digoxin for A-fib. -Continue holding Jardiance and Aldactone -Strict intake and output, daily weight, renal functions and electrolytes    History of CAD: Stable.  No anginal symptoms -Continue metoprolol and Crestor  History of COPD: Stable. -Continue bronchodilators from home.   Vitamin D deficiency -Continue oral vitamin D supplement.  Class II obesity Body mass index is 37.97 kg/m. -Encourage lifestyle change to lose weight.          DVT prophylaxis:  SCDs Start: 01/11/24 0455 Place TED hose Start: 01/11/24 0455  Code Status: Full code Family Communication: Updated family member at bedside. Level of care: Telemetry Medical Status is: Inpatient Remains inpatient appropriate because: Choledocholithiasis   Final disposition: Home Consultants:  GI General Surgery  55 minutes with more than 50% spent in reviewing records, counseling patient/family and coordinating care.   Sch Meds:  Scheduled Meds:  arformoterol  15 mcg Nebulization BID   digoxin  0.125 mg Oral Daily   fluticasone furoate-vilanterol  1 puff Inhalation Daily   And   umeclidinium  bromide  1 puff Inhalation Daily   metoprolol tartrate  50 mg Oral BID   sodium chloride flush  3 mL Intravenous Q12H   Continuous Infusions: PRN Meds:.HYDROmorphone (DILAUDID) injection, levalbuterol, ondansetron **OR** ondansetron (ZOFRAN) IV, sodium chloride flush  Antimicrobials: Anti-infectives (From admission, onward)    Start     Dose/Rate Route Frequency Ordered Stop   01/13/24  0730  ampicillin-sulbactam (UNASYN) 1.5 g in sodium chloride 0.9 % 100 mL IVPB        1.5 g 200 mL/hr over 30 Minutes Intravenous  Once 01/12/24 1852 01/13/24 0805        I have personally reviewed the following labs and images: CBC: Recent Labs  Lab 01/10/24 1839 01/11/24 0534 01/12/24 0810 01/13/24 0704  WBC 7.5 6.5 6.2 4.9  NEUTROABS 5.4  --   --   --   HGB 15.9 14.8 14.2 14.0  HCT 48.0 45.0 45.0 43.0  MCV 93.0 93.4 96.2 93.5  PLT 258 211 208 181   BMP &GFR Recent Labs  Lab 01/10/24 1839 01/11/24 0534 01/12/24 0810 01/13/24 0704  NA 136 136 137 136  K 4.2 3.9 4.7 4.6  CL 101 104 99 98  CO2 25 23 28 30   GLUCOSE 121* 128* 65* 95  BUN 15 12 8 9   CREATININE 1.08 0.94 0.89 0.94  CALCIUM 8.5* 8.0* 8.3* 8.5*  MG  --   --   --  1.8  PHOS  --   --   --  3.3   Estimated Creatinine Clearance: 113.7 mL/min (by C-G formula based on SCr of 0.94 mg/dL). Liver & Pancreas: Recent Labs  Lab 01/10/24 1839 01/11/24 0534 01/12/24 0810 01/13/24 0704  AST 171* 127* 59* 82*  ALT 597* 463* 314* 254*  ALKPHOS 185* 159* 149* 177*  BILITOT 3.7* 5.6* 1.9* 3.7*  PROT 6.8 6.1* 6.0* 6.0*  ALBUMIN 3.2* 2.8* 2.7* 2.6*   Recent Labs  Lab 01/10/24 1839  LIPASE 28   No results for input(s): "AMMONIA" in the last 168 hours. Diabetic: No results for input(s): "HGBA1C" in the last 72 hours. Recent Labs  Lab 01/13/24 0922  GLUCAP 156*   Cardiac Enzymes: No results for input(s): "CKTOTAL", "CKMB", "CKMBINDEX", "TROPONINI" in the last 168 hours. No results for input(s): "PROBNP" in the last 8760 hours. Coagulation Profile: No results for input(s): "INR", "PROTIME" in the last 168 hours. Thyroid Function Tests: No results for input(s): "TSH", "T4TOTAL", "FREET4", "T3FREE", "THYROIDAB" in the last 72 hours. Lipid Profile: No results for input(s): "CHOL", "HDL", "LDLCALC", "TRIG", "CHOLHDL", "LDLDIRECT" in the last 72 hours. Anemia Panel: No results for input(s): "VITAMINB12",  "FOLATE", "FERRITIN", "TIBC", "IRON", "RETICCTPCT" in the last 72 hours. Urine analysis:    Component Value Date/Time   COLORURINE YELLOW (A) 12/26/2023 0009   APPEARANCEUR CLEAR (A) 12/26/2023 0009   LABSPEC 1.040 (H) 12/26/2023 0009   PHURINE 5.0 12/26/2023 0009   GLUCOSEU >=500 (A) 12/26/2023 0009   HGBUR NEGATIVE 12/26/2023 0009   BILIRUBINUR NEGATIVE 12/26/2023 0009   KETONESUR NEGATIVE 12/26/2023 0009   PROTEINUR NEGATIVE 12/26/2023 0009   NITRITE NEGATIVE 12/26/2023 0009   LEUKOCYTESUR NEGATIVE 12/26/2023 0009   Sepsis Labs: Invalid input(s): "PROCALCITONIN", "LACTICIDVEN"  Microbiology: No results found for this or any previous visit (from the past 240 hours).  Radiology Studies: DG ERCP Result Date: 01/13/2024 CLINICAL DATA:  Choledocholithiasis. EXAM: ERCP TECHNIQUE: Multiple spot images obtained with the fluoroscopic device and submitted for interpretation post-procedure. COMPARISON:  MRI/MRCP study on 01/11/2024 FINDINGS: Procedural imaging  with a C-arm demonstrates cannulation of the common bile duct with cholangiogram demonstrating mildly dilated common bile duct and a rounded filling defect in the distal CBD consistent with a calculus. Balloon sweep maneuver was performed to remove the calculus. Completion cholangiogram demonstrates no visible filling defects. IMPRESSION: ERCP demonstrating filling defect in the common bile duct consistent with choledocholithiasis. A balloon sweep maneuver was performed to remove the calculus. These images were submitted for radiologic interpretation only. Please see the procedural report for the amount of contrast and the fluoroscopy time utilized. Electronically Signed   By: Irish Lack M.D.   On: 01/13/2024 10:24   DG C-Arm 1-60 Min-No Report Result Date: 01/13/2024 Fluoroscopy was utilized by the requesting physician.  No radiographic interpretation.   DG C-Arm 1-60 Min-No Report Result Date: 01/13/2024 Fluoroscopy was utilized by  the requesting physician.  No radiographic interpretation.      Eryanna Regal T. Breely Panik [facility]  If 7PM-7AM, please contact night-coverage [email or web address] 01/13/2024, 11:18 AM

## 2024-01-13 NOTE — Progress Notes (Signed)
Patient is back in the unit after procedure at 1000 am. Will continue to monitor

## 2024-01-13 NOTE — Transfer of Care (Signed)
Immediate Anesthesia Transfer of Care Note  Patient: Kenneth Benson  Procedure(s) Performed: ENDOSCOPIC RETROGRADE CHOLANGIOPANCREATOGRAPHY (ERCP) SPHINCTEROTOMY REMOVAL OF STONES  Patient Location: PACU  Anesthesia Type:General  Level of Consciousness: drowsy and patient cooperative  Airway & Oxygen Therapy: Patient Spontanous Breathing and Patient connected to nasal cannula oxygen  Post-op Assessment: Report given to RN and Post -op Vital signs reviewed and stable  Post vital signs: Reviewed and stable  Last Vitals:  Vitals Value Taken Time  BP 109/72 01/13/24 0921  Temp 36.8 C 01/13/24 0921  Pulse 87 01/13/24 0926  Resp 18 01/13/24 0926  SpO2 92 % 01/13/24 0926  Vitals shown include unfiled device data.  Last Pain:  Vitals:   01/13/24 0740  TempSrc: Oral  PainSc: 3          Complications: No notable events documented.

## 2024-01-13 NOTE — Interval H&P Note (Signed)
History and Physical Interval Note:  01/13/2024 7:47 AM  Kenneth Benson  has presented today for surgery, with the diagnosis of Common bile duct stones.  The various methods of treatment have been discussed with the patient and family. After consideration of risks, benefits and other options for treatment, the patient has consented to  Procedure(s): ENDOSCOPIC RETROGRADE CHOLANGIOPANCREATOGRAPHY (ERCP) (N/A) as a surgical intervention.  The patient's history has been reviewed, patient examined, no change in status, stable for surgery.  I have reviewed the patient's chart and labs.  Questions were answered to the patient's satisfaction.     Lya Holben D

## 2024-01-13 NOTE — Plan of Care (Signed)

## 2024-01-13 NOTE — Op Note (Signed)
[facility] Patient Name: Kenneth Benson Procedure Date : 01/13/2024 MRN: [id number] Attending MD: Jeani Hawking , MD, [id number] Date of Birth: 07/12/1961 CSN: [id number] Age: 63 Admit Type: Inpatient Procedure:                ERCP Indications:              Common bile duct stone(s) Providers:                Jeani Hawking, MD, Eliberto Ivory, RN, Brion Aliment,                            Technician Referring MD:              Medicines:                General Anesthesia Complications:            No immediate complications. Estimated Blood Loss:     Estimated blood loss: none. Procedure:                Pre-Anesthesia Assessment:                           - Prior to the procedure, a History and Physical                            was performed, and patient medications and                            allergies were reviewed. The patient's tolerance of                            previous anesthesia was also reviewed. The risks                            and benefits of the procedure and the sedation                            options and risks were discussed with the patient.                            All questions were answered, and informed consent                            was obtained. Prior Anticoagulants: The patient has                            taken no anticoagulant or antiplatelet agents. ASA                            Grade Assessment: III - A patient with severe                            systemic disease. After reviewing the risks and  benefits, the patient was deemed in satisfactory                            condition to undergo the procedure.                           - Sedation was administered by an anesthesia                            professional. General anesthesia was attained.                           After obtaining informed consent, the scope was                            passed under direct vision. Throughout the                             procedure, the patient's blood pressure, pulse, and                            oxygen saturations were monitored continuously. The                            [id number]-Q190V ([id number]) Olympus duodenoscope was                            introduced through the mouth, and used to inject                            contrast into and used to inject contrast into the                            bile duct. The ERCP was extremely difficult. The                            patient tolerated the procedure well. Scope In: Scope Out: Findings:      The major papilla was normal. A short 0.035 inch Soft Jagwire was passed       into the biliary tree. The bile duct was deeply cannulated with the       short-nosed traction sphincterotome. Contrast was injected. I personally       interpreted the bile duct images. There was brisk flow of contrast       through the ducts. Image quality was excellent. Contrast extended to the       bifurcation. The common bile duct contained one stone, which was 6 mm in       diameter. The common bile duct was moderately dilated and diffusely       dilated, acquired. The largest diameter was 10 mm. A 10 mm biliary       sphincterotomy was made with a traction (standard) sphincterotome using       ERBE electrocautery. There was no post-sphincterotomy bleeding. The       biliary tree was swept with a 12 mm balloon starting at the bifurcation.  One stone was removed. No stones remained.      The procedure was extremely difficult to perform as the patient had a       full stomach and he had a difficult anatomy. Because he was intubated,       the decision was made to continue with the procedure. It was difficult       as the food material continually made visualization difficult.       Compounding this issue, the patient's anatomy was difficult. When the       duodenoscope was placed into the short position the ampulla was only       visualized from a distance  proximally. Attempting visualization in the       long position only rotated the scope 90 degrees away from the ampulla.       In the short position, the sphincterotome was extended out and       manipulated in a long reaching position. Somehow the ampulla, in a half       bow sphincterotome position, was engaged. With various angles the CBD       was ultimately cannulated. The guidewire was easily advanced into the       intrahepatics and secured in position. Injection of contrast revealed a       dilated CBD at 10 mm with a distal obstructing stone. A 10-12 mm       sphincterotomy was created and there was free flow of clear bile and       contrast. The 12 mm balloon successfully extracted the stone during the       first attempt at at that diameter. The CBD was swept 3 more times at 12       mm and then a final occlusion cholangiogram was performed. There was no       evidence of any retained stones. Impression:               - The major papilla appeared normal.                           - The common bile duct was moderately dilated,                            acquired.                           - Choledocholithiasis was found. Complete removal                            was accomplished by biliary sphincterotomy and                            balloon extraction.                           - A biliary sphincterotomy was performed.                           - The biliary tree was swept. Recommendation:           - Return patient to hospital ward for ongoing care.                           -  Clear liquid diet.                           - Lap chole per Surgery. Procedure Code(s):        --- Professional ---                           [phone], Endoscopic retrograde                            cholangiopancreatography (ERCP); with removal of                            calculi/debris from biliary/pancreatic duct(s)                           43262, Endoscopic retrograde                             cholangiopancreatography (ERCP); with                            sphincterotomy/papillotomy                           [phone], Endoscopic catheterization of the biliary                            ductal system, radiological supervision and                            interpretation Diagnosis Code(s):        --- Professional ---                           K80.50, Calculus of bile duct without cholangitis                            or cholecystitis without obstruction                           K83.8, Other specified diseases of biliary tract CPT copyright 2022 American [employer] Association. All rights reserved. The codes documented in this report are preliminary and upon coder review may  be revised to meet current compliance requirements. Jeani Hawking, MD Jeani Hawking, MD 01/13/2024 9:49:43 AM This report has been signed electronically. Number of Addenda: 0

## 2024-01-13 NOTE — Plan of Care (Signed)
  Problem: Education: Goal: Knowledge of General Education information will improve Description: Including pain rating scale, medication(s)/side effects and non-pharmacologic comfort measures Outcome: Progressing   Problem: Health Behavior/Discharge Planning: Goal: Ability to manage health-related needs will improve Outcome: Progressing   Problem: Clinical Measurements: Goal: Cardiovascular complication will be avoided Outcome: Progressing   Problem: Activity: Goal: Risk for activity intolerance will decrease Outcome: Progressing   Problem: Coping: Goal: Level of anxiety will decrease Outcome: Progressing   Problem: Elimination: Goal: Will not experience complications related to urinary retention Outcome: Progressing   Problem: Pain Managment: Goal: General experience of comfort will improve and/or be controlled Outcome: Progressing   Problem: Safety: Goal: Ability to remain free from injury will improve Outcome: Progressing   Problem: Skin Integrity: Goal: Risk for impaired skin integrity will decrease Outcome: Progressing

## 2024-01-13 NOTE — Anesthesia Postprocedure Evaluation (Signed)
Anesthesia Post Note  Patient: Kenneth Benson  Procedure(s) Performed: ENDOSCOPIC RETROGRADE CHOLANGIOPANCREATOGRAPHY (ERCP) SPHINCTEROTOMY REMOVAL OF STONES     Patient location during evaluation: PACU Anesthesia Type: General Level of consciousness: awake and alert Pain management: pain level controlled Vital Signs Assessment: post-procedure vital signs reviewed and stable Respiratory status: spontaneous breathing, nonlabored ventilation and respiratory function stable Cardiovascular status: blood pressure returned to baseline and stable Postop Assessment: no apparent nausea or vomiting Anesthetic complications: no   No notable events documented.  Last Vitals:  Vitals:   01/13/24 0941 01/13/24 1002  BP: 114/81 108/64  Pulse: 84 77  Resp: 19   Temp: 36.8 C 36.7 C  SpO2: 91% 94%    Last Pain:  Vitals:   01/13/24 1002  TempSrc: Oral  PainSc:                  Collene Schlichter

## 2024-01-13 NOTE — Progress Notes (Signed)
Patient has taken for the procedure around 0720 AM.

## 2024-01-13 NOTE — Progress Notes (Signed)
Patient ID: Kenneth Benson, male   DOB: 1961-03-20, 63 y.o.   MRN: [id number]  Pt undergoing ERCP today for CBD stone removal Will plan on lap chole Sunday if pt doing well post procedure.  NPO p MN  Consent for lap chole

## 2024-01-13 NOTE — Anesthesia Preprocedure Evaluation (Addendum)
Anesthesia Evaluation  Patient identified by MRN, date of birth, ID band Patient awake    Reviewed: Allergy & Precautions, NPO status , Patient's Chart, lab work & pertinent test results, reviewed documented beta blocker date and time   Airway Mallampati: II  TM Distance: >3 FB Neck ROM: Full    Dental  (+) Dental Advisory Given, Edentulous Upper, Edentulous Lower   Pulmonary asthma , COPD, Current Smoker   Pulmonary exam normal breath sounds clear to auscultation       Cardiovascular hypertension, Pt. on medications and Pt. on home beta blockers +CHF  + dysrhythmias Atrial Fibrillation + Valvular Problems/Murmurs MR  Rhythm:Regular Rate:Normal + Systolic murmurs Echo 12/18/22: 1. Left ventricular ejection fraction, by estimation, is 45 to 50%. The  left ventricle has mildly decreased function. The left ventricle has no  regional wall motion abnormalities. The left ventricular internal cavity  size was mildly dilated. Left  ventricular diastolic parameters were normal.   2. Right ventricular systolic function is normal. The right ventricular  size is normal. There is mildly elevated pulmonary artery systolic  pressure.   3. The mitral valve is normal in structure. Moderate mitral valve  regurgitation. No evidence of mitral stenosis.   4. Tricuspid valve regurgitation is moderate.   5. The aortic valve is normal in structure. Aortic valve regurgitation is  not visualized. No aortic stenosis is present.   6. The inferior vena cava is normal in size with greater than 50%  respiratory variability, suggesting right atrial pressure of 3 mmHg.     Neuro/Psych negative neurological ROS  negative psych ROS   GI/Hepatic negative GI ROS,,,Common bile duct stones   Endo/Other  Obesity   Renal/GU negative Renal ROS     Musculoskeletal negative musculoskeletal ROS (+)    Abdominal   Peds  Hematology  (+) Blood dyscrasia  (Eliquis)   Anesthesia Other Findings Day of surgery medications reviewed with the patient.  Reproductive/Obstetrics                              Anesthesia Physical Anesthesia Plan  ASA: 3  Anesthesia Plan: General   Post-op Pain Management:    Induction: Intravenous  PONV Risk Score and Plan: 1 and Dexamethasone and Ondansetron  Airway Management Planned: Oral ETT  Additional Equipment:   Intra-op Plan:   Post-operative Plan: Extubation in OR  Informed Consent: I have reviewed the patients History and Physical, chart, labs and discussed the procedure including the risks, benefits and alternatives for the proposed anesthesia with the patient or authorized representative who has indicated his/her understanding and acceptance.     Dental advisory given  Plan Discussed with: CRNA  Anesthesia Plan Comments:          Anesthesia Quick Evaluation

## 2024-01-14 ENCOUNTER — Other Ambulatory Visit: Payer: Self-pay

## 2024-01-14 ENCOUNTER — Encounter (HOSPITAL_COMMUNITY): Payer: Self-pay | Admitting: Internal Medicine

## 2024-01-14 ENCOUNTER — Inpatient Hospital Stay (HOSPITAL_COMMUNITY): Payer: Medicaid Other | Admitting: Certified Registered Nurse Anesthetist

## 2024-01-14 ENCOUNTER — Encounter (HOSPITAL_COMMUNITY)
Admission: EM | Disposition: A | Discharge status: Home / Self Care | Payer: Self-pay | Source: Home / Self Care | Attending: Student

## 2024-01-14 DIAGNOSIS — I480 Paroxysmal atrial fibrillation: Secondary | ICD-10-CM | POA: Diagnosis not present

## 2024-01-14 DIAGNOSIS — J449 Chronic obstructive pulmonary disease, unspecified: Secondary | ICD-10-CM

## 2024-01-14 DIAGNOSIS — I110 Hypertensive heart disease with heart failure: Secondary | ICD-10-CM | POA: Diagnosis not present

## 2024-01-14 DIAGNOSIS — F17210 Nicotine dependence, cigarettes, uncomplicated: Secondary | ICD-10-CM

## 2024-01-14 DIAGNOSIS — Z8709 Personal history of other diseases of the respiratory system: Secondary | ICD-10-CM | POA: Diagnosis not present

## 2024-01-14 DIAGNOSIS — K8061 Calculus of gallbladder and bile duct with cholecystitis, unspecified, with obstruction: Secondary | ICD-10-CM

## 2024-01-14 DIAGNOSIS — E538 Deficiency of other specified B group vitamins: Secondary | ICD-10-CM | POA: Diagnosis not present

## 2024-01-14 DIAGNOSIS — I509 Heart failure, unspecified: Secondary | ICD-10-CM | POA: Diagnosis not present

## 2024-01-14 DIAGNOSIS — R7401 Elevation of levels of liver transaminase levels: Secondary | ICD-10-CM | POA: Diagnosis not present

## 2024-01-14 HISTORY — PX: CHOLECYSTECTOMY: SHX55

## 2024-01-14 LAB — CBC
HCT: 43.3 % (ref 39.0–52.0)
Hemoglobin: 14.2 g/dL (ref 13.0–17.0)
MCH: 30.7 pg (ref 26.0–34.0)
MCHC: 32.8 g/dL (ref 30.0–36.0)
MCV: 93.5 fL (ref 80.0–100.0)
Platelets: 190 10*3/uL (ref 150–400)
RBC: 4.63 MIL/uL (ref 4.22–5.81)
RDW: 14 % (ref 11.5–15.5)
WBC: 7.7 10*3/uL (ref 4.0–10.5)
nRBC: 0 % (ref 0.0–0.2)

## 2024-01-14 LAB — COMPREHENSIVE METABOLIC PANEL
ALT: 191 U/L — ABNORMAL HIGH (ref 0–44)
AST: 32 U/L (ref 15–41)
Albumin: 2.6 g/dL — ABNORMAL LOW (ref 3.5–5.0)
Alkaline Phosphatase: 168 U/L — ABNORMAL HIGH (ref 38–126)
Anion gap: 10 (ref 5–15)
BUN: 6 mg/dL — ABNORMAL LOW (ref 8–23)
CO2: 27 mmol/L (ref 22–32)
Calcium: 8.3 mg/dL — ABNORMAL LOW (ref 8.9–10.3)
Chloride: 101 mmol/L (ref 98–111)
Creatinine, Ser: 0.72 mg/dL (ref 0.61–1.24)
GFR, Estimated: >60 mL/min (ref >60)
Glucose, Bld: 118 mg/dL — ABNORMAL HIGH (ref 70–99)
Potassium: 4.2 mmol/L (ref 3.5–5.1)
Sodium: 138 mmol/L (ref 135–145)
Total Bilirubin: 1.7 mg/dL — ABNORMAL HIGH (ref 0.0–1.2)
Total Protein: 6.1 g/dL — ABNORMAL LOW (ref 6.5–8.1)

## 2024-01-14 LAB — LIPASE, BLOOD: Lipase: 20 U/L (ref 11–51)

## 2024-01-14 LAB — MAGNESIUM: Magnesium: 1.9 mg/dL (ref 1.7–2.4)

## 2024-01-14 SURGERY — LAPAROSCOPIC CHOLECYSTECTOMY
Anesthesia: General | Site: Abdomen

## 2024-01-14 MED ORDER — SUGAMMADEX SODIUM 200 MG/2ML IV SOLN
INTRAVENOUS | Status: DC | PRN
  Administered 2024-01-14: 200 mg via INTRAVENOUS

## 2024-01-14 MED ORDER — SODIUM CHLORIDE 0.9 % IR SOLN
Status: DC | PRN
  Administered 2024-01-14: 1000 mL

## 2024-01-14 MED ORDER — FENTANYL CITRATE (PF) 100 MCG/2ML IJ SOLN: 25.0000 ug | INTRAMUSCULAR | Status: DC | PRN

## 2024-01-14 MED ORDER — CEFAZOLIN SODIUM-DEXTROSE 3-4 GM/150ML-% IV SOLN
INTRAVENOUS | Status: AC
  Filled 2024-01-14: qty 150

## 2024-01-14 MED ORDER — LACTATED RINGERS IV SOLN: INTRAVENOUS | Status: AC

## 2024-01-14 MED ORDER — LIDOCAINE 2% (20 MG/ML) 5 ML SYRINGE
INTRAMUSCULAR | Status: DC | PRN
  Administered 2024-01-14: 60 mg via INTRAVENOUS

## 2024-01-14 MED ORDER — LIDOCAINE 2% (20 MG/ML) 5 ML SYRINGE
INTRAMUSCULAR | Status: AC
  Filled 2024-01-14: qty 5

## 2024-01-14 MED ORDER — MIDAZOLAM HCL 2 MG/2ML IJ SOLN
INTRAMUSCULAR | Status: AC
  Filled 2024-01-14: qty 2

## 2024-01-14 MED ORDER — 0.9 % SODIUM CHLORIDE (POUR BTL) OPTIME
TOPICAL | Status: DC | PRN
  Administered 2024-01-14: 1000 mL

## 2024-01-14 MED ORDER — BUPIVACAINE-EPINEPHRINE 0.25% -1:200000 IJ SOLN
INTRAMUSCULAR | Status: DC | PRN
  Administered 2024-01-14: 19 mL

## 2024-01-14 MED ORDER — DEXAMETHASONE SODIUM PHOSPHATE 10 MG/ML IJ SOLN
INTRAMUSCULAR | Status: DC | PRN
  Administered 2024-01-14: 5 mg via INTRAVENOUS

## 2024-01-14 MED ORDER — ONDANSETRON HCL 4 MG/2ML IJ SOLN
INTRAMUSCULAR | Status: AC
  Filled 2024-01-14: qty 2

## 2024-01-14 MED ORDER — ONDANSETRON HCL 4 MG/2ML IJ SOLN
INTRAMUSCULAR | Status: DC | PRN
  Administered 2024-01-14: 4 mg via INTRAVENOUS

## 2024-01-14 MED ORDER — SUCCINYLCHOLINE CHLORIDE 200 MG/10ML IV SOSY
PREFILLED_SYRINGE | INTRAVENOUS | Status: DC | PRN
  Administered 2024-01-14: 100 mg via INTRAVENOUS

## 2024-01-14 MED ORDER — LACTATED RINGERS IV SOLN: INTRAVENOUS | Status: DC

## 2024-01-14 MED ORDER — CHLORHEXIDINE GLUCONATE 0.12 % MT SOLN
OROMUCOSAL | Status: AC
  Administered 2024-01-14: 15 mL via OROMUCOSAL
  Filled 2024-01-14: qty 15

## 2024-01-14 MED ORDER — HYDROMORPHONE HCL 1 MG/ML IJ SOLN
INTRAMUSCULAR | Status: DC | PRN
  Administered 2024-01-14: .5 mg via INTRAVENOUS

## 2024-01-14 MED ORDER — CHLORHEXIDINE GLUCONATE 0.12 % MT SOLN: 15.0000 mL | Freq: Once | OROMUCOSAL | Status: AC

## 2024-01-14 MED ORDER — PHENYLEPHRINE HCL-NACL 20-0.9 MG/250ML-% IV SOLN
INTRAVENOUS | Status: DC | PRN
  Administered 2024-01-14: 30 ug/min via INTRAVENOUS
  Administered 2024-01-14: 20 ug/min via INTRAVENOUS

## 2024-01-14 MED ORDER — PROPOFOL 10 MG/ML IV BOLUS
INTRAVENOUS | Status: DC | PRN
  Administered 2024-01-14: 120 mg via INTRAVENOUS

## 2024-01-14 MED ORDER — INDOCYANINE GREEN 25 MG IV SOLR
2.5000 mg | Freq: Once | INTRAVENOUS | Status: AC
  Administered 2024-01-14: 2.5 mg via INTRAVENOUS
  Filled 2024-01-14 (×2): qty 10

## 2024-01-14 MED ORDER — HEMOSTATIC AGENTS (NO CHARGE) OPTIME
TOPICAL | Status: DC | PRN
  Administered 2024-01-14: 1 via TOPICAL

## 2024-01-14 MED ORDER — ORAL CARE MOUTH RINSE: 15.0000 mL | Freq: Once | OROMUCOSAL | Status: AC

## 2024-01-14 MED ORDER — PROPOFOL 10 MG/ML IV BOLUS
INTRAVENOUS | Status: AC
  Filled 2024-01-14: qty 20

## 2024-01-14 MED ORDER — ONDANSETRON HCL 4 MG/2ML IJ SOLN: 4.0000 mg | Freq: Once | INTRAMUSCULAR | Status: DC | PRN

## 2024-01-14 MED ORDER — ROCURONIUM BROMIDE 10 MG/ML (PF) SYRINGE
PREFILLED_SYRINGE | INTRAVENOUS | Status: DC | PRN
  Administered 2024-01-14: 20 mg via INTRAVENOUS
  Administered 2024-01-14: 50 mg via INTRAVENOUS
  Administered 2024-01-14: 10 mg via INTRAVENOUS

## 2024-01-14 MED ORDER — DEXAMETHASONE SODIUM PHOSPHATE 10 MG/ML IJ SOLN
INTRAMUSCULAR | Status: AC
  Filled 2024-01-14: qty 1

## 2024-01-14 MED ORDER — FENTANYL CITRATE (PF) 250 MCG/5ML IJ SOLN
INTRAMUSCULAR | Status: AC
  Filled 2024-01-14: qty 5

## 2024-01-14 MED ORDER — ROCURONIUM BROMIDE 10 MG/ML (PF) SYRINGE
PREFILLED_SYRINGE | INTRAVENOUS | Status: AC
  Filled 2024-01-14: qty 10

## 2024-01-14 MED ORDER — HYDROMORPHONE HCL 1 MG/ML IJ SOLN: 0.2500 mg | INTRAMUSCULAR | Status: DC | PRN

## 2024-01-14 MED ORDER — HYDROMORPHONE HCL 1 MG/ML IJ SOLN
INTRAMUSCULAR | Status: AC
  Filled 2024-01-14: qty 0.5

## 2024-01-14 MED ORDER — OXYCODONE HCL 5 MG PO TABS
5.0000 mg | ORAL_TABLET | ORAL | Status: DC | PRN
  Administered 2024-01-14 – 2024-01-15 (×2): 5 mg via ORAL
  Filled 2024-01-14 (×2): qty 1

## 2024-01-14 MED ORDER — FENTANYL CITRATE (PF) 250 MCG/5ML IJ SOLN
INTRAMUSCULAR | Status: DC | PRN
  Administered 2024-01-14: 50 ug via INTRAVENOUS
  Administered 2024-01-14: 100 ug via INTRAVENOUS
  Administered 2024-01-14 (×2): 50 ug via INTRAVENOUS

## 2024-01-14 MED ORDER — BUPIVACAINE-EPINEPHRINE (PF) 0.25% -1:200000 IJ SOLN
INTRAMUSCULAR | Status: AC
  Filled 2024-01-14: qty 30

## 2024-01-14 MED ORDER — SUGAMMADEX SODIUM 200 MG/2ML IV SOLN
INTRAVENOUS | Status: AC
  Filled 2024-01-14: qty 2

## 2024-01-14 SURGICAL SUPPLY — 42 items
APPLICATOR ARISTA FLEXITIP XL (MISCELLANEOUS) IMPLANT
BAG COUNTER SPONGE SURGICOUNT (BAG) ×2 IMPLANT
BLADE CLIPPER SURG (BLADE) IMPLANT
CANISTER SUCT 3000ML PPV (MISCELLANEOUS) ×2 IMPLANT
CHLORAPREP W/TINT 26 (MISCELLANEOUS) ×2 IMPLANT
CLIP LIGATING HEMO LOK XL GOLD (MISCELLANEOUS) IMPLANT
CLIP LIGATING HEMO O LOK GREEN (MISCELLANEOUS) ×2 IMPLANT
COVER SURGICAL LIGHT HANDLE (MISCELLANEOUS) ×2 IMPLANT
DERMABOND ADVANCED .7 DNX12 (GAUZE/BANDAGES/DRESSINGS) ×2 IMPLANT
ELECT REM PT RETURN 9FT ADLT (ELECTROSURGICAL) ×2
ELECTRODE REM PT RTRN 9FT ADLT (ELECTROSURGICAL) ×2 IMPLANT
GLOVE BIOGEL PI MICRO STRL 6 (GLOVE) ×2 IMPLANT
GLOVE INDICATOR 6.5 STRL GRN (GLOVE) ×2 IMPLANT
GOWN STRL REUS W/ TWL LRG LVL3 (GOWN DISPOSABLE) ×6 IMPLANT
GRASPER SUT TROCAR 14GX15 (MISCELLANEOUS) ×2 IMPLANT
HEMOSTAT ARISTA ABSORB 3G PWDR (HEMOSTASIS) IMPLANT
IRRIG SUCT STRYKERFLOW 2 WTIP (MISCELLANEOUS) ×2
IRRIGATION SUCT STRKRFLW 2 WTP (MISCELLANEOUS) ×2 IMPLANT
KIT BASIN OR (CUSTOM PROCEDURE TRAY) ×2 IMPLANT
KIT IMAGING PINPOINTPAQ (MISCELLANEOUS) IMPLANT
KIT TURNOVER KIT B (KITS) ×2 IMPLANT
L-HOOK LAP DISP 36CM (ELECTROSURGICAL) ×2
LHOOK LAP DISP 36CM (ELECTROSURGICAL) ×2 IMPLANT
NDL INSUFFLATION 14GA 120MM (NEEDLE) ×2 IMPLANT
NEEDLE INSUFFLATION 14GA 120MM (NEEDLE) ×2 IMPLANT
NS IRRIG 1000ML POUR BTL (IV SOLUTION) ×2 IMPLANT
PAD ARMBOARD 7.5X6 YLW CONV (MISCELLANEOUS) ×2 IMPLANT
PENCIL BUTTON HOLSTER BLD 10FT (ELECTRODE) ×2 IMPLANT
SCISSORS LAP 5X35 DISP (ENDOMECHANICALS) ×2 IMPLANT
SET TUBE SMOKE EVAC HIGH FLOW (TUBING) ×2 IMPLANT
SLEEVE Z-THREAD 5X100MM (TROCAR) ×4 IMPLANT
SPECIMEN JAR SMALL (MISCELLANEOUS) ×2 IMPLANT
SUT MNCRL AB 4-0 PS2 18 (SUTURE) ×2 IMPLANT
SUT VICRYL 0 UR6 27IN ABS (SUTURE) IMPLANT
SYS BAG RETRIEVAL 10MM (BASKET) ×2
SYSTEM BAG RETRIEVAL 10MM (BASKET) ×2 IMPLANT
TOWEL GREEN STERILE (TOWEL DISPOSABLE) IMPLANT
TRAY LAPAROSCOPIC MC (CUSTOM PROCEDURE TRAY) ×2 IMPLANT
TROCAR Z THREAD OPTICAL 12X100 (TROCAR) ×2 IMPLANT
TROCAR Z-THREAD OPTICAL 5X100M (TROCAR) ×2 IMPLANT
WARMER LAPAROSCOPE (MISCELLANEOUS) ×2 IMPLANT
WATER STERILE IRR 1000ML POUR (IV SOLUTION) ×2 IMPLANT

## 2024-01-14 NOTE — Anesthesia Preprocedure Evaluation (Addendum)
Anesthesia Evaluation  Patient identified by MRN, date of birth, ID band Patient awake    Reviewed: Allergy & Precautions, H&P , NPO status , Patient's Chart, lab work & pertinent test results  Airway Mallampati: II  TM Distance: >3 FB Neck ROM: Full    Dental no notable dental hx. (+) Edentulous Upper, Edentulous Lower, Dental Advisory Given   Pulmonary asthma , COPD,  COPD inhaler, Current Smoker   Pulmonary exam normal breath sounds clear to auscultation       Cardiovascular hypertension, Pt. on medications and Pt. on home beta blockers +CHF  + dysrhythmias Atrial Fibrillation  Rhythm:Regular Rate:Normal     Neuro/Psych negative neurological ROS  negative psych ROS   GI/Hepatic negative GI ROS, Neg liver ROS,,,  Endo/Other    Class 3 obesity  Renal/GU Renal disease  negative genitourinary   Musculoskeletal   Abdominal   Peds  Hematology negative hematology ROS (+)   Anesthesia Other Findings   Reproductive/Obstetrics negative OB ROS                             Anesthesia Physical Anesthesia Plan  ASA: 3  Anesthesia Plan: General   Post-op Pain Management: Ofirmev IV (intra-op)*   Induction: Intravenous  PONV Risk Score and Plan: 2 and Ondansetron and Dexamethasone  Airway Management Planned: Oral ETT  Additional Equipment:   Intra-op Plan:   Post-operative Plan: Extubation in OR  Informed Consent: I have reviewed the patients History and Physical, chart, labs and discussed the procedure including the risks, benefits and alternatives for the proposed anesthesia with the patient or authorized representative who has indicated his/her understanding and acceptance.     Dental advisory given  Plan Discussed with: CRNA  Anesthesia Plan Comments:        Anesthesia Quick Evaluation

## 2024-01-14 NOTE — Transfer of Care (Signed)
Immediate Anesthesia Transfer of Care Note  Patient: Kenneth Benson  Procedure(s) Performed: LAPAROSCOPIC CHOLECYSTECTOMY INDOCYANINE GREEN FLUORESCENCE IMAGING (ICG) (Abdomen)  Patient Location: PACU  Anesthesia Type:General  Level of Consciousness: awake, drowsy, and patient cooperative  Airway & Oxygen Therapy: Patient Spontanous Breathing and Patient connected to nasal cannula oxygen  Post-op Assessment: Report given to RN and Post -op Vital signs reviewed and stable  Post vital signs: Reviewed and stable  Last Vitals:  Vitals Value Taken Time  BP 142/93 01/14/24 1827  Temp    Pulse 100 01/14/24 1830  Resp 16 01/14/24 1830  SpO2 91 % 01/14/24 1830  Vitals shown include unfiled device data.  Last Pain:  Vitals:   01/14/24 1338  TempSrc: Oral  PainSc: 0-No pain         Complications: No notable events documented.

## 2024-01-14 NOTE — Progress Notes (Signed)
PROGRESS NOTE  Kenneth Benson BJY:[id number] DOB: May 15, 1961   PCP: Pcp, No  Patient is from: Home.  DOA: 01/10/2024 LOS: 3  Chief complaints Chief Complaint  Patient presents with   Chest Pain     Brief Narrative / Interim history: 63 year old M with PMH of HFmrEF, COPD, PAF on Eliquis and cholelithiasis presented to ED with RUQ, epigastric and right flank pain for about 3 weeks with associated nausea and vomiting, and admitted with transaminitis in the setting of cholelithiasis and choledocholithiasis as noted on MRCP.  Patient underwent ESRD on 2/15.  Plan for lap chole in 2/16.  Subjective: Seen and examined earlier this morning.  No major events overnight of this morning.  No complaints other than some abdominal pain.  He did not take pain medication out of concern for interference with his surgery.  No nausea or vomiting.  Objective: Vitals:   01/14/24 0424 01/14/24 0500 01/14/24 0803 01/14/24 0850  BP: 112/61   90/65  Pulse: 78  76 87  Resp: 16  18 18   Temp: 97.9 F (36.6 C)   97.9 F (36.6 C)  TempSrc: Oral     SpO2: 97%  96% 92%  Weight:  115.8 kg    Height:        Examination:  GENERAL: No apparent distress.  Nontoxic. HEENT: MMM.  Vision and hearing grossly intact.  NECK: Supple.  No apparent JVD.  RESP:  No IWOB.  Fair aeration bilaterally. CVS:  RRR. Heart sounds normal.  ABD/GI/GU: BS+. Abd soft, NTND.  MSK/EXT:  Moves extremities. No apparent deformity. No edema.  SKIN: no apparent skin lesion or wound NEURO: Awake, alert and oriented appropriately.  No apparent focal neuro deficit. PSYCH: Calm. Normal affect.   Procedures:  2/15-ERCP  Microbiology summarized: None  Assessment and plan: Elevated liver enzymes due to choledocholithiasis Cholelithiasis-noted on RUQ [place] and MRCP.  No cholecystitis. -MRCP positive for 6 mm choledocholithiasis. -S/p ERCP on 2/15.  LFT improved. -Plan for lap chole today. -Started IV fluid while NPO.  Paroxysmal  atrial fibrillation: Rate controlled.  Recently hospitalized for A-fib with RVR and started on amiodarone, Cardizem and digoxin.  He is on Eliquis for anticoagulation. -Continue holding amiodarone in the setting of elevated LFT -Continue holding Eliquis pending surgery -Continue digoxin and metoprolol 50 mg twice daily.  Cardizem on hold.  Chronic HFmrEF: TTE in 11/2022 with LVEF of 45 to 50%, moderate MR. Appears euvolemic on exam.  On Jardiance and Aldactone at home.  He is on digoxin for A-fib. -Continue holding Jardiance and Aldactone -Monitor respiratory status while on gentle IV fluid -Strict intake and output, daily weight, renal functions and electrolytes    History of CAD: Stable.  No anginal symptoms -Continue metoprolol and Crestor  History of COPD: Stable. -Continue bronchodilators from home.   Vitamin D deficiency -Continue oral vitamin D supplement.  Class II obesity Body mass index is 34.62 kg/m. -Encourage lifestyle change to lose weight.          DVT prophylaxis:  enoxaparin (LOVENOX) injection 40 mg Start: 01/13/24 2130 SCD's Start: 01/13/24 2034 SCDs Start: 01/11/24 0455 Place TED hose Start: 01/11/24 0455  Code Status: Full code Family Communication: Updated spouse at bedside. Level of care: Telemetry Medical Status is: Inpatient Remains inpatient appropriate because: Choledocholithiasis   Final disposition: Home Consultants:  GI General Surgery  35 minutes with more than 50% spent in reviewing records, counseling patient/family and coordinating care.   Sch Meds:  Scheduled Meds:  arformoterol  15 mcg Nebulization BID    ceFAZolin (ANCEF) IV  3 g Intravenous On Call to OR   digoxin  0.125 mg Oral Daily   enoxaparin (LOVENOX) injection  40 mg Subcutaneous Once   fluticasone furoate-vilanterol  1 puff Inhalation Daily   And   umeclidinium bromide  1 puff Inhalation Daily   indocyanine green  7.5 mg Intravenous Once   metoprolol tartrate  50  mg Oral BID   sodium chloride flush  3 mL Intravenous Q12H   Continuous Infusions:  lactated ringers 75 mL/hr at 01/14/24 1012   PRN Meds:.HYDROmorphone (DILAUDID) injection, levalbuterol, ondansetron **OR** ondansetron (ZOFRAN) IV, sodium chloride flush  Antimicrobials: Anti-infectives (From admission, onward)    Start     Dose/Rate Route Frequency Ordered Stop   01/14/24 0600  ceFAZolin (ANCEF) IVPB 3g/150 mL premix        3 g 300 mL/hr over 30 Minutes Intravenous On call to O.R. 01/13/24 2033 01/15/24 0559   01/13/24 0730  ampicillin-sulbactam (UNASYN) 1.5 g in sodium chloride 0.9 % 100 mL IVPB        1.5 g 200 mL/hr over 30 Minutes Intravenous  Once 01/12/24 1852 01/13/24 0805        I have personally reviewed the following labs and images: CBC: Recent Labs  Lab 01/10/24 1839 01/11/24 0534 01/12/24 0810 01/13/24 0704 01/14/24 0707  WBC 7.5 6.5 6.2 4.9 7.7  NEUTROABS 5.4  --   --   --   --   HGB 15.9 14.8 14.2 14.0 14.2  HCT 48.0 45.0 45.0 43.0 43.3  MCV 93.0 93.4 96.2 93.5 93.5  PLT 258 211 208 181 190   BMP &GFR Recent Labs  Lab 01/10/24 1839 01/11/24 0534 01/12/24 0810 01/13/24 0704 01/14/24 0707  NA 136 136 137 136 138  K 4.2 3.9 4.7 4.6 4.2  CL 101 104 99 98 101  CO2 25 23 28 30 27   GLUCOSE 121* 128* 65* 95 118*  BUN 15 12 8 9  6*  CREATININE 1.08 0.94 0.89 0.94 0.72  CALCIUM 8.5* 8.0* 8.3* 8.5* 8.3*  MG  --   --   --  1.8 1.9  PHOS  --   --   --  3.3  --    Estimated Creatinine Clearance: 127.4 mL/min (by C-G formula based on SCr of 0.72 mg/dL). Liver & Pancreas: Recent Labs  Lab 01/10/24 1839 01/11/24 0534 01/12/24 0810 01/13/24 0704 01/14/24 0707  AST 171* 127* 59* 82* 32  ALT 597* 463* 314* 254* 191*  ALKPHOS 185* 159* 149* 177* 168*  BILITOT 3.7* 5.6* 1.9* 3.7* 1.7*  PROT 6.8 6.1* 6.0* 6.0* 6.1*  ALBUMIN 3.2* 2.8* 2.7* 2.6* 2.6*   Recent Labs  Lab 01/10/24 1839 01/14/24 0707  LIPASE 28 20   No results for input(s): "AMMONIA"  in the last 168 hours. Diabetic: No results for input(s): "HGBA1C" in the last 72 hours. Recent Labs  Lab 01/13/24 0922  GLUCAP 156*   Cardiac Enzymes: No results for input(s): "CKTOTAL", "CKMB", "CKMBINDEX", "TROPONINI" in the last 168 hours. No results for input(s): "PROBNP" in the last 8760 hours. Coagulation Profile: No results for input(s): "INR", "PROTIME" in the last 168 hours. Thyroid Function Tests: No results for input(s): "TSH", "T4TOTAL", "FREET4", "T3FREE", "THYROIDAB" in the last 72 hours. Lipid Profile: No results for input(s): "CHOL", "HDL", "LDLCALC", "TRIG", "CHOLHDL", "LDLDIRECT" in the last 72 hours. Anemia Panel: No results for input(s): "VITAMINB12", "FOLATE", "FERRITIN", "TIBC", "IRON", "RETICCTPCT" in the last  72 hours. Urine analysis:    Component Value Date/Time   COLORURINE YELLOW (A) 12/26/2023 0009   APPEARANCEUR CLEAR (A) 12/26/2023 0009   LABSPEC 1.040 (H) 12/26/2023 0009   PHURINE 5.0 12/26/2023 0009   GLUCOSEU >=500 (A) 12/26/2023 0009   HGBUR NEGATIVE 12/26/2023 0009   BILIRUBINUR NEGATIVE 12/26/2023 0009   KETONESUR NEGATIVE 12/26/2023 0009   PROTEINUR NEGATIVE 12/26/2023 0009   NITRITE NEGATIVE 12/26/2023 0009   LEUKOCYTESUR NEGATIVE 12/26/2023 0009   Sepsis Labs: Invalid input(s): "PROCALCITONIN", "LACTICIDVEN"  Microbiology: No results found for this or any previous visit (from the past 240 hours).  Radiology Studies: No results found.     Umer Harig T. Manar Smalling [facility]  If 7PM-7AM, please contact night-coverage [email or web address] 01/14/2024, 10:45 AM

## 2024-01-14 NOTE — Op Note (Signed)
01/14/2024 6:24 PM  PATIENT: Kenneth Benson  63 y.o. male  Patient Care Team: Pcp, No as PCP - General  PRE-OPERATIVE DIAGNOSIS: choledocholithiasis  POST-OPERATIVE DIAGNOSIS: same  PROCEDURE: laparoscopic cholecystectomy, intraoperative cholangiogram using indocyanine green  SURGEON: Donata Duff, MD  ASSISTANT: None  ANESTHESIA: General endotracheal  EBL: 10cc  DRAINS: None  SPECIMEN: Gallbladder  COUNTS: Sponge, needle and instrument counts were reported correct x2 at the conclusion of the operation  DISPOSITION: PACU in satisfactory condition  COMPLICATIONS: Malfunctioning of camera equipment, poor quality visuals initially requiring change in equipment. Glitching of video with bovie use requiring changing bovie equipment.  FINDINGS: Cholelithiasis and chronically inflamed and contracted gallbladder. Critical view achieved. ICG did not light up gallbladder but CBD identified and ICG dye flow from CBD to duodenum both prior to clipping and after clipping duct.  DESCRIPTION:  The patient was identified & brought into the operating room. He was then positioned supine on the OR table. SCDs were in place and active during the entire case. He then underwent general endotracheal anesthesia. Pressure points were padded. Hair on the abdomen was clipped by the OR team. The abdomen was prepped and draped in the standard sterile fashion. Antibiotics were administered. A surgical timeout was performed and confirmed our plan.   A veress needle was placed in LUQ and aspiration of air on entry to abdomen and positive drop test. Abdomen then insufflated to . Periumbilical incision made and 5mm trocar placed using optiview with a 30 degree scope. Inspection confirmed no evidence of trocar or veress site complications and veress then removed. The patient was then positioned in reverse Trendelenburg with slight left side down. A 12 mm supxiphoid trocar was placed under direct visualization  and  two additional 5mm trocars were placed along the right subcostal line - one 5mm port in mid subcostal region, another 5mm port in the right flank near the anterior axillary line.  The liver and gallbladder were inspected. The gallbladder fundus was grasped and elevated cephalad. An additional grasper was then placed on the infundibulum of the gallbladder and the infundibulum was retracted laterally. Staying high on the gallbladder, the peritoneum on both sides of the gallbladder was opened with hook cautery. Issues with camera quality and use of bovie as mentioned in findings requiring exchange of equipment. Gentle blunt dissection was then employed with a Art gallery manager working down into [employer]. The cystic duct was identified and carefully circumferentially dissected. The cystic artery was also identified and carefully circumferentially dissected. The space between the cystic artery and hepatocystic plate was developed such that a good view of the liver could be seen through a window medial to the cystic artery. The triangle of Calot had been cleared of all fibrofatty tissue. At this point, a critical view of safety was achieved and the only structures visualized was the skeletonized cystic duct laterally, the skeletonized cystic artery and the liver through the window medial to the artery.   Cholangiogram using ICG was employed both prior and after clipping and confirmed dye within CBD and into duodenum.  The cystic duct and artery were clipped with 2 hemolock clips on the patient side and 1 clip on the specimen side.  The cystic duct and artery were then divided. The gallbladder was then freed from its remaining attachments to the liver using electrocautery and placed into an endocatch bag. There was some violation of gallbladder from retraction and spillage of a few large stones which were all identified and removed.  The RUQ was gently irrigated with sterile saline. Arista was used in  the gallbladder fossa. Hemostasis was then verified. The clips were in good position; the gallbladder fossa was dry. The rest of the abdomen was inspected no injury nor bleeding elsewhere was identified.  The endocatch bag containing the gallbladder was then removed from the subxiphoid port site and passed off as specimen. The subxiphoid port fascia was then closed in a figured of eight fashion with 0 vicryl using a suture passer. The RUQ ports were removed under direct visualization and noted to be hemostatic.Marland Kitchen The fascia was palpated and noted to be completely closed. The abdomen was then desufflated and the periumbilical trocar removed. The skin of all incision sites was approximated with 4-0 monocryl subcuticular suture and dermabond applied. The patient was then awakened from anesthesia, extubated, and transferred to a stretcher for transport to PACU in satisfactory condition.  Instrument, sponge, and needle counts were correct at closure and at the conclusion of the case.   Donata Duff, MD [facility]

## 2024-01-14 NOTE — Progress Notes (Signed)
Progress Note  * Day of Surgery *  Subjective: Minimal pain. S/p ERCP yesterday  Objective: Vital signs in last 24 hours: Temp:  [97.4 F (36.3 C)-98.4 F (36.9 C)] 98.4 F (36.9 C) (02/16 1311) Pulse Rate:  [61-87] 79 (02/16 1338) Resp:  [14-18] 14 (02/16 1338) BP: (90-114)/(58-70) 102/70 (02/16 1311) SpO2:  [92 %-97 %] 93 % (02/16 1311) Weight:  [115.8 kg] 115.8 kg (02/16 0500) Last BM Date : 01/12/24  Intake/Output from previous day: 02/15 0701 - 02/16 0700 In: 800 [P.O.:300; I.V.:400; IV Piggyback:100] Out: -  Intake/Output this shift: No intake/output data recorded.  PE: Gen: NAD Abd: Soft, minimally tender RUQ, nondistended  Lab Results:  Recent Labs    01/13/24 0704 01/14/24 0707  WBC 4.9 7.7  HGB 14.0 14.2  HCT 43.0 43.3  PLT 181 190   BMET Recent Labs    01/13/24 0704 01/14/24 0707  NA 136 138  K 4.6 4.2  CL 98 101  CO2 30 27  GLUCOSE 95 118*  BUN 9 6*  CREATININE 0.94 0.72  CALCIUM 8.5* 8.3*   PT/INR No results for input(s): "LABPROT", "INR" in the last 72 hours. CMP     Component Value Date/Time   NA 138 01/14/2024 0707   K 4.2 01/14/2024 0707   CL 101 01/14/2024 0707   CO2 27 01/14/2024 0707   GLUCOSE 118 (H) 01/14/2024 0707   BUN 6 (L) 01/14/2024 0707   CREATININE 0.72 01/14/2024 0707   CALCIUM 8.3 (L) 01/14/2024 0707   PROT 6.1 (L) 01/14/2024 0707   ALBUMIN 2.6 (L) 01/14/2024 0707   AST 32 01/14/2024 0707   ALT 191 (H) 01/14/2024 0707   ALKPHOS 168 (H) 01/14/2024 0707   BILITOT 1.7 (H) 01/14/2024 0707   GFRNONAA >60 01/14/2024 0707   GFRAA >90 01/26/2013 1657   Lipase     Component Value Date/Time   LIPASE 20 01/14/2024 0707       Studies/Results: DG ERCP Result Date: 01/13/2024 CLINICAL DATA:  Choledocholithiasis. EXAM: ERCP TECHNIQUE: Multiple spot images obtained with the fluoroscopic device and submitted for interpretation post-procedure. COMPARISON:  MRI/MRCP study on 01/11/2024 FINDINGS: Procedural imaging  with a C-arm demonstrates cannulation of the common bile duct with cholangiogram demonstrating mildly dilated common bile duct and a rounded filling defect in the distal CBD consistent with a calculus. Balloon sweep maneuver was performed to remove the calculus. Completion cholangiogram demonstrates no visible filling defects. IMPRESSION: ERCP demonstrating filling defect in the common bile duct consistent with choledocholithiasis. A balloon sweep maneuver was performed to remove the calculus. These images were submitted for radiologic interpretation only. Please see the procedural report for the amount of contrast and the fluoroscopy time utilized. Electronically Signed   By: Irish Lack M.D.   On: 01/13/2024 10:24   DG C-Arm 1-60 Min-No Report Result Date: 01/13/2024 Fluoroscopy was utilized by the requesting physician.  No radiographic interpretation.   DG C-Arm 1-60 Min-No Report Result Date: 01/13/2024 Fluoroscopy was utilized by the requesting physician.  No radiographic interpretation.    Anti-infectives: Anti-infectives (From admission, onward)    Start     Dose/Rate Route Frequency Ordered Stop   01/14/24 1336  ceFAZolin (ANCEF) 3-4 GM/150ML-% IVPB       Note to Pharmacy: Kathrene Bongo D: cabinet override      01/14/24 1336 01/15/24 0144   01/14/24 0600  ceFAZolin (ANCEF) IVPB 3g/150 mL premix        3 g 300 mL/hr over 30 Minutes  Intravenous On call to O.R. 01/13/24 2033 01/15/24 0559   01/13/24 0730  ampicillin-sulbactam (UNASYN) 1.5 g in sodium chloride 0.9 % 100 mL IVPB        1.5 g 200 mL/hr over 30 Minutes Intravenous  Once 01/12/24 1852 01/13/24 0805        Assessment/Plan 63 yo M withcholedocholithiasis   - Proceed to OR for laparoscopic cholecystectomy with ICG  We discussed the etiology of patient's pain, we discussed treatment options and recommended surgery. We discussed details of surgery including general anesthesia, laparoscopic approach, identification  of cystic duct and common bile duct. Ligation of cystic duct and cystic artery. Possible need for intraoperative cholangiogram, open procedure, and subtotal cholecystectomy. Possible risks of common bile duct injury, injury to surrounding structures, bile leak, bleeding, infection, diarrhea, retained stone and hernia. The patient showed good understanding and all questions were answered   LOS: 3 days   I reviewed Consultant GI notes, last 24 h vitals and pain scores, last 48 h intake and output, last 24 h labs and trends, and last 24 h imaging results.  This care required straight-forward level of medical decision making.    Lysle Rubens, MD [facility] 01/14/2024, 4:13 PM Please see Amion for pager number during day hours 7:00am-4:30pm

## 2024-01-14 NOTE — Plan of Care (Signed)

## 2024-01-14 NOTE — Progress Notes (Addendum)
Patient received on the floor at 1900pm after the procedure. On 2L Nasal canula.Alert and oriented.connected to the cardiac monitor. SCD placed. He has 4 incision sites on his abdomen. Intact. No bleeding. Will continue to monitor

## 2024-01-14 NOTE — Progress Notes (Signed)
Subjective: No complaints.  Feeling well.  Objective: Vital signs in last 24 hours: Temp:  [97.4 F (36.3 C)-98.3 F (36.8 C)] 97.9 F (36.6 C) (02/16 0424) Pulse Rate:  [61-96] 76 (02/16 0803) Resp:  [12-24] 18 (02/16 0803) BP: (96-114)/(58-81) 112/61 (02/16 0424) SpO2:  [91 %-97 %] 96 % (02/16 0803) Weight:  [115.8 kg] 115.8 kg (02/16 0500) Last BM Date : 01/12/24  Intake/Output from previous day: 02/15 0701 - 02/16 0700 In: 800 [P.O.:300; I.V.:400; IV Piggyback:100] Out: -  Intake/Output this shift: No intake/output data recorded.  General appearance: alert and no distress GI: soft, non-tender; bowel sounds normal; no masses,  no organomegaly  Lab Results: Recent Labs    01/12/24 0810 01/13/24 0704 01/14/24 0707  WBC 6.2 4.9 7.7  HGB 14.2 14.0 14.2  HCT 45.0 43.0 43.3  PLT 208 181 190   BMET Recent Labs    01/12/24 0810 01/13/24 0704  NA 137 136  K 4.7 4.6  CL 99 98  CO2 28 30  GLUCOSE 65* 95  BUN 8 9  CREATININE 0.89 0.94  CALCIUM 8.3* 8.5*   LFT Recent Labs    01/13/24 0704  PROT 6.0*  ALBUMIN 2.6*  AST 82*  ALT 254*  ALKPHOS 177*  BILITOT 3.7*   PT/INR No results for input(s): "LABPROT", "INR" in the last 72 hours. Hepatitis Panel Recent Labs    01/11/24 1758  HEPBSAG NON REACTIVE  HCVAB NON REACTIVE  HEPAIGM NON REACTIVE  HEPBIGM NON REACTIVE   C-Diff No results for input(s): "CDIFFTOX" in the last 72 hours. Fecal Lactopherrin No results for input(s): "FECLLACTOFRN" in the last 72 hours.  Studies/Results: DG ERCP Result Date: 01/13/2024 CLINICAL DATA:  Choledocholithiasis. EXAM: ERCP TECHNIQUE: Multiple spot images obtained with the fluoroscopic device and submitted for interpretation post-procedure. COMPARISON:  MRI/MRCP study on 01/11/2024 FINDINGS: Procedural imaging with a C-arm demonstrates cannulation of the common bile duct with cholangiogram demonstrating mildly dilated common bile duct and a rounded filling defect in the  distal CBD consistent with a calculus. Balloon sweep maneuver was performed to remove the calculus. Completion cholangiogram demonstrates no visible filling defects. IMPRESSION: ERCP demonstrating filling defect in the common bile duct consistent with choledocholithiasis. A balloon sweep maneuver was performed to remove the calculus. These images were submitted for radiologic interpretation only. Please see the procedural report for the amount of contrast and the fluoroscopy time utilized. Electronically Signed   By: Irish Lack M.D.   On: 01/13/2024 10:24   DG C-Arm 1-60 Min-No Report Result Date: 01/13/2024 Fluoroscopy was utilized by the requesting physician.  No radiographic interpretation.   DG C-Arm 1-60 Min-No Report Result Date: 01/13/2024 Fluoroscopy was utilized by the requesting physician.  No radiographic interpretation.    Medications: Scheduled:  arformoterol  15 mcg Nebulization BID    ceFAZolin (ANCEF) IV  3 g Intravenous On Call to OR   digoxin  0.125 mg Oral Daily   enoxaparin (LOVENOX) injection  40 mg Subcutaneous Once   fluticasone furoate-vilanterol  1 puff Inhalation Daily   And   umeclidinium bromide  1 puff Inhalation Daily   indocyanine green  7.5 mg Intravenous Once   metoprolol tartrate  50 mg Oral BID   sodium chloride flush  3 mL Intravenous Q12H   Continuous:  Assessment/Plan: 1) Choledocholithiasis s/p ERCP with successful stone extraction. 2) Cholelithiasis.   Clinically the patient is well.  No complications from the ERCP.  Plan: 1) Lap chole per Surgery.   LOS:  3 days   Lerin Jech D 01/14/2024, 8:09 AM

## 2024-01-14 NOTE — Progress Notes (Signed)
Patient transported to the OR via bed at 1315 pm. RN went with him to the OR.

## 2024-01-14 NOTE — Anesthesia Postprocedure Evaluation (Signed)
Anesthesia Post Note  Patient: FRANKO HILLIKER  Procedure(s) Performed: LAPAROSCOPIC CHOLECYSTECTOMY INDOCYANINE GREEN FLUORESCENCE IMAGING (ICG) (Abdomen)     Patient location during evaluation: PACU Anesthesia Type: General Level of consciousness: awake and alert Pain management: pain level controlled Vital Signs Assessment: post-procedure vital signs reviewed and stable Respiratory status: spontaneous breathing, nonlabored ventilation, respiratory function stable and patient connected to nasal cannula oxygen Cardiovascular status: blood pressure returned to baseline and stable Postop Assessment: no apparent nausea or vomiting Anesthetic complications: no  No notable events documented.  Last Vitals:  Vitals:   01/14/24 1830 01/14/24 1845  BP: (!) 144/89 133/82  Pulse: 100 (!) 106  Resp: 16 16  Temp:  37 C  SpO2: 91%     Last Pain:  Vitals:   01/14/24 1845  TempSrc:   PainSc: 0-No pain                 Yvett Rossel,W. EDMOND

## 2024-01-14 NOTE — Anesthesia Procedure Notes (Signed)
Procedure Name: Intubation Date/Time: 01/14/2024 4:24 PM  Performed by: Jodell Cipro, CRNAPre-anesthesia Checklist: Patient identified, Emergency Drugs available, Suction available and Patient being monitored Patient Re-evaluated:Patient Re-evaluated prior to induction Oxygen Delivery Method: Circle System Utilized Preoxygenation: Pre-oxygenation with 100% oxygen Induction Type: IV induction Ventilation: Mask ventilation without difficulty Laryngoscope Size: Mac and 4 Grade View: Grade I Tube type: Oral Tube size: 7.5 mm Number of attempts: 1 Airway Equipment and Method: Stylet and Oral airway Placement Confirmation: ETT inserted through vocal cords under direct vision, positive ETCO2 and breath sounds checked- equal and bilateral Secured at: 23 cm Tube secured with: Tape Dental Injury: Teeth and Oropharynx as per pre-operative assessment

## 2024-01-15 ENCOUNTER — Other Ambulatory Visit (HOSPITAL_COMMUNITY): Payer: Self-pay

## 2024-01-15 ENCOUNTER — Encounter (HOSPITAL_COMMUNITY): Payer: Self-pay | Admitting: General Surgery

## 2024-01-15 DIAGNOSIS — I5042 Chronic combined systolic (congestive) and diastolic (congestive) heart failure: Secondary | ICD-10-CM | POA: Diagnosis not present

## 2024-01-15 DIAGNOSIS — E538 Deficiency of other specified B group vitamins: Secondary | ICD-10-CM | POA: Diagnosis not present

## 2024-01-15 DIAGNOSIS — R7401 Elevation of levels of liver transaminase levels: Secondary | ICD-10-CM | POA: Diagnosis not present

## 2024-01-15 DIAGNOSIS — I480 Paroxysmal atrial fibrillation: Secondary | ICD-10-CM | POA: Diagnosis not present

## 2024-01-15 LAB — COMPREHENSIVE METABOLIC PANEL
ALT: 148 U/L — ABNORMAL HIGH (ref 0–44)
AST: 36 U/L (ref 15–41)
Albumin: 2.6 g/dL — ABNORMAL LOW (ref 3.5–5.0)
Alkaline Phosphatase: 145 U/L — ABNORMAL HIGH (ref 38–126)
Anion gap: 8 (ref 5–15)
BUN: 7 mg/dL — ABNORMAL LOW (ref 8–23)
CO2: 29 mmol/L (ref 22–32)
Calcium: 8.1 mg/dL — ABNORMAL LOW (ref 8.9–10.3)
Chloride: 101 mmol/L (ref 98–111)
Creatinine, Ser: 0.74 mg/dL (ref 0.61–1.24)
GFR, Estimated: >60 mL/min (ref >60)
Glucose, Bld: 120 mg/dL — ABNORMAL HIGH (ref 70–99)
Potassium: 4.5 mmol/L (ref 3.5–5.1)
Sodium: 138 mmol/L (ref 135–145)
Total Bilirubin: 1.2 mg/dL (ref 0.0–1.2)
Total Protein: 6.1 g/dL — ABNORMAL LOW (ref 6.5–8.1)

## 2024-01-15 LAB — CBC
HCT: 43 % (ref 39.0–52.0)
Hemoglobin: 14 g/dL (ref 13.0–17.0)
MCH: 30.6 pg (ref 26.0–34.0)
MCHC: 32.6 g/dL (ref 30.0–36.0)
MCV: 94.1 fL (ref 80.0–100.0)
Platelets: 213 10*3/uL (ref 150–400)
RBC: 4.57 MIL/uL (ref 4.22–5.81)
RDW: 14.2 % (ref 11.5–15.5)
WBC: 7.5 10*3/uL (ref 4.0–10.5)
nRBC: 0 % (ref 0.0–0.2)

## 2024-01-15 LAB — MAGNESIUM: Magnesium: 1.9 mg/dL (ref 1.7–2.4)

## 2024-01-15 MED ORDER — OXYCODONE HCL 5 MG PO TABS
5.0000 mg | ORAL_TABLET | Freq: Four times a day (QID) | ORAL | 0 refills | Status: AC | PRN
Start: 2024-01-15 — End: 2024-01-18
  Filled 2024-01-15: qty 12, 3d supply, fill #0

## 2024-01-15 NOTE — Plan of Care (Signed)

## 2024-01-15 NOTE — Progress Notes (Signed)
Progress Note  1 Day Post-Op  Subjective: Pt reports some soreness at subxiphoid incision but otherwise feels well. Concerned about discharge as he is homeless - [facility] consult is already ordered.   Objective: Vital signs in last 24 hours: Temp:  [97.8 F (36.6 C)-98.6 F (37 C)] 97.8 F (36.6 C) (02/17 0804) Pulse Rate:  [78-106] 85 (02/17 0804) Resp:  [14-18] 17 (02/17 0804) BP: (102-144)/(70-93) 122/77 (02/17 0804) SpO2:  [91 %-97 %] 97 % (02/17 0856) FiO2 (%):  [28 %] 28 % (02/16 2126) Weight:  [120.9 kg] 120.9 kg (02/17 0417) Last BM Date : 01/12/24  Intake/Output from previous day: 02/16 0701 - 02/17 0700 In: 900 [I.V.:900] Out: 1535 [Urine:1510; Blood:25] Intake/Output this shift: Total I/O In: -  Out: 125 [Urine:125]  PE: General: pleasant, WD, obese male who is laying in bed in NAD Lungs: Respiratory effort nonlabored on [place] Abd: soft, appropriately ttp, incisions C/D/I Psych: A&Ox3 with an appropriate affect.    Lab Results:  Recent Labs    01/14/24 0707 01/15/24 0635  WBC 7.7 7.5  HGB 14.2 14.0  HCT 43.3 43.0  PLT 190 213   BMET Recent Labs    01/14/24 0707 01/15/24 0635  NA 138 138  K 4.2 4.5  CL 101 101  CO2 27 29  GLUCOSE 118* 120*  BUN 6* 7*  CREATININE 0.72 0.74  CALCIUM 8.3* 8.1*   PT/INR No results for input(s): "LABPROT", "INR" in the last 72 hours. CMP     Component Value Date/Time   NA 138 01/15/2024 0635   K 4.5 01/15/2024 0635   CL 101 01/15/2024 0635   CO2 29 01/15/2024 0635   GLUCOSE 120 (H) 01/15/2024 0635   BUN 7 (L) 01/15/2024 0635   CREATININE 0.74 01/15/2024 0635   CALCIUM 8.1 (L) 01/15/2024 0635   PROT 6.1 (L) 01/15/2024 0635   ALBUMIN 2.6 (L) 01/15/2024 0635   AST 36 01/15/2024 0635   ALT 148 (H) 01/15/2024 0635   ALKPHOS 145 (H) 01/15/2024 0635   BILITOT 1.2 01/15/2024 0635   GFRNONAA >60 01/15/2024 0635   GFRAA >90 01/26/2013 1657   Lipase     Component Value Date/Time   LIPASE 20 01/14/2024 0707        Studies/Results: No results found.  Anti-infectives: Anti-infectives (From admission, onward)    Start     Dose/Rate Route Frequency Ordered Stop   01/14/24 1336  ceFAZolin (ANCEF) 3-4 GM/150ML-% IVPB       Note to Pharmacy: Kathrene Bongo D: cabinet override      01/14/24 1336 01/14/24 1643   01/14/24 0600  ceFAZolin (ANCEF) IVPB 3g/150 mL premix        3 g 300 mL/hr over 30 Minutes Intravenous On call to O.R. 01/13/24 2033 01/14/24 1618   01/13/24 0730  ampicillin-sulbactam (UNASYN) 1.5 g in sodium chloride 0.9 % 100 mL IVPB        1.5 g 200 mL/hr over 30 Minutes Intravenous  Once 01/12/24 1852 01/13/24 0805        Assessment/Plan Choledocholithiasis   POD1 s/p laparoscopic cholecystectomy  - S/P ERCP 2/15  - stable for DC from surgical standpoint when medically appropriate - follow up in AVS  FEN: HH diet  VTE: ok to have LMWH or SQH from surgery standpoint ID: no current  LOS: 4 days     Juliet Rude, [facility] 01/15/2024, 10:06 AM Please see Amion for pager number during day hours 7:00am-4:30pm

## 2024-01-15 NOTE — Progress Notes (Signed)
SATURATION QUALIFICATIONS: (This note is used to comply with regulatory documentation for home oxygen)  Patient Saturations on Room Air at Rest = 87%  Patient Saturations on Room Air while Ambulating = 84%  Patient Saturations on 2 Liters of oxygen while Ambulating = 96%  Please briefly explain why patient needs home oxygen:

## 2024-01-15 NOTE — Plan of Care (Signed)
Patient being discharged and patient given and educated on discharge paperwork. Patient is being sent to discharge lounge to await oxygen equipment. Patient received medications from Discharge pharmacy.  Problem: Education: Goal: Knowledge of General Education information will improve Description: Including pain rating scale, medication(s)/side effects and non-pharmacologic comfort measures Outcome: Adequate for Discharge   Problem: Health Behavior/Discharge Planning: Goal: Ability to manage health-related needs will improve Outcome: Adequate for Discharge   Problem: Clinical Measurements: Goal: Ability to maintain clinical measurements within normal limits will improve Outcome: Adequate for Discharge Goal: Will remain free from infection Outcome: Adequate for Discharge Goal: Diagnostic test results will improve Outcome: Adequate for Discharge Goal: Respiratory complications will improve Outcome: Adequate for Discharge Goal: Cardiovascular complication will be avoided Outcome: Adequate for Discharge   Problem: Activity: Goal: Risk for activity intolerance will decrease Outcome: Adequate for Discharge   Problem: Nutrition: Goal: Adequate nutrition will be maintained Outcome: Adequate for Discharge   Problem: Coping: Goal: Level of anxiety will decrease Outcome: Adequate for Discharge   Problem: Elimination: Goal: Will not experience complications related to bowel motility Outcome: Adequate for Discharge Goal: Will not experience complications related to urinary retention Outcome: Adequate for Discharge   Problem: Pain Managment: Goal: General experience of comfort will improve and/or be controlled Outcome: Adequate for Discharge   Problem: Safety: Goal: Ability to remain free from injury will improve Outcome: Adequate for Discharge   Problem: Skin Integrity: Goal: Risk for impaired skin integrity will decrease Outcome: Adequate for Discharge

## 2024-01-15 NOTE — Progress Notes (Signed)
    Durable Medical Equipment  (From admission, onward)           Start     Ordered   01/15/24 1144  For home use only DME oxygen  Once       Comments: Portable oxygen concentrator is needed in place of portable oxygen tank.  Question Answer Comment  Length of Need 12 Months   Mode or (Route) Nasal cannula   Liters per Minute 2   Frequency Continuous (stationary and portable oxygen unit needed)   Oxygen conserving device Yes   Oxygen delivery system Gas      01/15/24 1145   01/15/24 1134  For home use only DME oxygen  Once       Question Answer Comment  Length of Need Lifetime   Mode or (Route) Nasal cannula   Liters per Minute 2   Frequency Continuous (stationary and portable oxygen unit needed)   Oxygen delivery system Gas      01/15/24 1133

## 2024-01-15 NOTE — Progress Notes (Signed)
Transition of Care [facility]) - Inpatient Brief Assessment   Patient Details  Name: Kenneth Benson MRN: [id number] Date of Birth: July 20, 1961  [facility]) CM/SW Contact:    Janae Bridgeman, RN Phone Number: 01/15/2024, 11:55 AM   Clinical Narrative: CM met with the patient and friend at the bedside and the patient is currently homeless and plans to go to [employer] or [employer] for shelter tonight.  I spoke with the patient and patient's [facility] oxygen tank is empty.  I called Pollyann Savoy, CM and spoke with him and he states that they plan to provide the patient with portable oxygen tank or portable oxygen concentrator.  [facility] states that the patient has other oxygen equipment that is present and locked up in the patient's previous address.  [facility] states that the patient was evicted from his previous address and other oxygen equipment remains at this previous place of residence.  [facility] needs new oxygen orders, walking ambulation test and new DME order for portable oxygen.  This was completed and [facility] Plans to send portable tank/oxygen concentrator to [address] and then transferred to discharge lounge.  Patient was provided with addresses for warming stations for tonight.   Transition of Care Asessment: Insurance and Status: (P) Insurance coverage has been reviewed Patient has primary care physician: (P) Yes Home environment has been reviewed: (P) Homeless Prior level of function:: (P) Independent Prior/Current Home Services: (P) No current home services (Home oxygen through [facility]) Social Drivers of Health Review: (P) SDOH reviewed interventions complete Readmission risk has been reviewed: (P) Yes Transition of care needs: (P) transition of care needs identified, TOC will continue to follow

## 2024-01-15 NOTE — Discharge Summary (Signed)
Physician Discharge Summary  JOHANTHAN KNEELAND BJY:[id number] DOB: 18-Dec-1960 DOA: 01/10/2024  PCP: Oneita Hurt, No  Admit date: 01/10/2024 Discharge date: 01/15/24  Admitted From: Homeless Disposition: Homeless shelter Recommendations for Outpatient Follow-up:  Encouraged to establish care with PCP as soon as possible.  Provided with resources. Encouraged to reschedule his appointment with cardiology Outpatient follow-up with general surgery as below Check CMP and CBC at follow-up Please follow up on the following pending results: None  Home Health: None Equipment/Devices: Patient has home oxygen  Discharge Condition: Stable CODE STATUS: Full code  Follow-up Information     Maczis, Hedda Slade, PA-C. Go on 02/08/2024.   Specialty: General Surgery Why: 3/13 at 1:45 pm. Please arrive 30 minutes early to complete check in, and bring photo ID and insurance card. 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 Patient underwent ERCP on 01/13/2024.  He also underwent lap chole on 01/14/2024.  No postop complications.  LFT improved.  He is cleared for discharge for outpatient follow-up.   See individual problem list below for more.   Problems addressed during this hospitalization Elevated liver enzymes due to choledocholithiasis Cholelithiasis-noted on RUQ [place] and MRCP.  No cholecystitis. -MRCP positive for 6 mm choledocholithiasis. -S/p ERCP on 2/15, and lap chole on 2/16.  LFT improved.  Tolerated regular diet. -Outpatient follow-up as above. -Recheck CMP at follow-up    Paroxysmal atrial fibrillation: Rate controlled.  Recently hospitalized for A-fib with RVR and started on amiodarone, Cardizem, metoprolol and digoxin.  He is on Eliquis for anticoagulation. -Continue amiodarone 200 mg daily, metoprolol and digoxin -Cardizem discontinued given his reduced EF.  He did not require this in hospital. -Encouraged to reschedule his follow-up with cardiology as soon as possible.   Chronic HFmrEF: TTE in 11/2022 with LVEF of 45 to 50%, moderate MR. Appears euvolemic on exam.  On Jardiance and Aldactone at home.  He is on digoxin for A-fib. -Continue home meds -Discontinued Cardizem. -Encouraged to schedule his follow-up appointment with cardiology.   History of CAD: Stable.  No anginal symptoms -Continue metoprolol  -Hold Crestor for 1 week.   History of COPD/chronic hypoxic respiratory failure.  On 2 L at baseline.  Stable. -Reassess oxygen need at follow-up -Continue home breathing treatments.  Homelessness: -Provided with resources   Vitamin D deficiency -Continue oral vitamin D supplement.   Class II obesity Body mass index is 36.15 kg/m. -Encourage lifestyle change to lose weight.           Time spent 35 minutes  Vital signs Vitals:   01/15/24 0417 01/15/24 0417 01/15/24 0804 01/15/24 0856  BP:  114/73 122/77   Pulse:  78 85   Temp:  98.4 F (36.9 C) 97.8 F (36.6 C)   Resp:  18 17   Height:      Weight: 120.9 kg     SpO2:  96% 96% 97%  TempSrc:  Oral Oral   BMI (Calculated): 36.14        Discharge exam  GENERAL: No apparent distress.  Nontoxic. HEENT: MMM.  Vision and hearing grossly intact.  NECK: Supple.  No apparent JVD.  RESP:  No IWOB.  Fair aeration bilaterally. CVS: Irregular rhythm.  Normal rate. Heart sounds normal.  ABD/GI/GU: BS+. Abd soft.  Laparoscopic wounds DCI.  Mild tenderness. MSK/EXT:  Moves extremities. No apparent deformity. No edema.  SKIN: no apparent skin lesion or wound NEURO: Awake and alert.  Oriented appropriately.  No apparent focal neuro deficit. PSYCH: Calm. Normal affect.   Discharge Instructions Discharge Instructions     Diet - low sodium heart healthy   Complete by: As directed    Discharge instructions   Complete by: As directed    It has been a pleasure taking care of you!  You were hospitalized due to gallstone for which you have been treated surgically medically.  Your liver enzymes improved.  Please follow-up with your primary care doctor in 1 to 2 weeks or sooner if needed.  Follow-up with your surgeon per their recommendation.  Please reschedule your follow-up appointment with cardiology   Take care,   Increase activity slowly   Complete by: As directed       Allergies as of 01/15/2024   No Known Allergies      Medication List     PAUSE taking these medications    rosuvastatin 5 MG tablet Wait to take this until: January 22, 2024 Commonly known as: CRESTOR Take 5 mg by mouth daily.       STOP taking these medications    arformoterol 15 MCG/2ML Nebu Commonly known as: Brovana   diltiazem 180 MG 24 hr capsule Commonly known as: CARDIZEM CD       TAKE these medications    amiodarone 200 MG tablet Commonly known as: PACERONE Take 1 tablet (200 mg total) by mouth daily. What changed: Another medication with the same name was removed. Continue taking this medication, and follow the directions you see here.   cyanocobalamin 1000 MCG tablet Commonly known as: VITAMIN B12 Take 1 tablet (1,000 mcg total) by mouth daily.   digoxin 0.125 MG tablet Commonly known as: LANOXIN Take 1 tablet (0.125 mg total) by mouth daily.   Eliquis 5 MG Tabs tablet Generic drug: apixaban Take 1 tablet (5 mg total) by mouth 2 (two) times daily.   Jardiance 10 MG Tabs tablet Generic drug: empagliflozin Take 1 tablet (10 mg total) by mouth daily.   metoprolol tartrate 50 MG tablet Commonly known as: LOPRESSOR Take 1 tablet (50 mg total) by mouth 2  (two) times daily.   oxyCODONE 5 MG immediate release tablet Commonly known as: Oxy IR/ROXICODONE Take 1 tablet (5 mg total) by mouth every 6 (six) hours as needed for up to 3 days for moderate pain (pain score 4-6) or severe pain (pain score 7-10).   spironolactone 25 MG tablet Commonly known as: ALDACTONE Take 0.5 tablets (12.5 mg total) by mouth daily. Hold this medication until you see your cardiologist What changed: how much to take   Trelegy Ellipta 200-62.5-25 MCG/ACT Aepb Generic drug: Fluticasone-Umeclidin-Vilant Inhale 1 puff into the lungs daily.   Ventolin HFA 108 (90 Base) MCG/ACT inhaler Generic drug: albuterol Inhale 2 puffs into the lungs every 6 (six) hours as needed for wheezing or shortness of breath. What changed: Another medication with the same name was removed. Continue taking this medication, and follow the directions you see here.   Vitamin D (Ergocalciferol) 1.25 MG (50000 UNIT) Caps capsule Commonly known as: DRISDOL Take 1 capsule (50,000 Units total) by mouth  every 7 (seven) days.        Consultations: General surgery Gastroenterology  Procedures/Studies: 2/16-laparoscopic cholecystectomy   DG ERCP Result Date: 01/13/2024 CLINICAL DATA:  Choledocholithiasis. EXAM: ERCP TECHNIQUE: Multiple spot images obtained with the fluoroscopic device and submitted for interpretation post-procedure. COMPARISON:  MRI/MRCP study on 01/11/2024 FINDINGS: Procedural imaging with a C-arm demonstrates cannulation of the common bile duct with cholangiogram demonstrating mildly dilated common bile duct and a rounded filling defect in the distal CBD consistent with a calculus. Balloon sweep maneuver was performed to remove the calculus. Completion cholangiogram demonstrates no visible filling defects. IMPRESSION: ERCP demonstrating filling defect in the common bile duct consistent with choledocholithiasis. A balloon sweep maneuver was performed to remove the calculus. These  images were submitted for radiologic interpretation only. Please see the procedural report for the amount of contrast and the fluoroscopy time utilized. Electronically Signed   By: Irish Lack M.D.   On: 01/13/2024 10:24   DG C-Arm 1-60 Min-No Report Result Date: 01/13/2024 Fluoroscopy was utilized by the requesting physician.  No radiographic interpretation.   DG C-Arm 1-60 Min-No Report Result Date: 01/13/2024 Fluoroscopy was utilized by the requesting physician.  No radiographic interpretation.   MR ABDOMEN MRCP W WO CONTAST Result Date: 01/11/2024 CLINICAL DATA:  Three-week history of right upper quadrant pain associated with nausea and vomiting and elevated liver function tests EXAM: MRI ABDOMEN WITHOUT AND WITH CONTRAST (INCLUDING MRCP) TECHNIQUE: Multiplanar multisequence MR imaging of the abdomen was performed both before and after the administration of intravenous contrast. Heavily T2-weighted images of the biliary and pancreatic ducts were obtained, and three-dimensional MRCP images were rendered by post processing. CONTRAST:  10mL GADAVIST GADOBUTROL 1 MMOL/ML IV SOLN COMPARISON:  Same day right upper quadrant ultrasound examination FINDINGS: Lower chest: No acute findings. Hepatobiliary: No mass or other parenchymal abnormality identified. New mild intrahepatic bile duct dilation. Low and medial insertion of the cystic duct into the common bile duct. The common bile duct is mildly prominent measuring 7 mm in diameter with a 6 mm filling defect within the downstream common bile duct at the level of the ampulla. Markedly contracted gallbladder containing gallstones. Pancreas: No mass, inflammatory changes, or other parenchymal abnormality identified. Spleen:  Enlarged spleen measures 13.6 cm, previously 12.3 cm Adrenals/Urinary Tract: No adrenal nodules. No suspicious renal masses identified. No evidence of hydronephrosis. 4.5 cm exophytic right lower pole simple cyst. No specific follow-up  imaging recommended. Stomach/Bowel: Visualized portions within the abdomen are unremarkable. Vascular/Lymphatic: No pathologically enlarged lymph nodes identified. No abdominal aortic aneurysm demonstrated. Other:  None. Musculoskeletal: No suspicious bone lesions identified. IMPRESSION: 1. Findings suspicious for choledocholithiasis with 6 mm stone in the downstream common bile duct near the ampulla. 2. Markedly contracted gallbladder containing gallstones. The cystic duct inserts low and medial into the common bile duct. 3. Mild splenomegaly. Electronically Signed   By: Agustin Cree M.D.   On: 01/11/2024 08:52   MR 3D Recon At Scanner Result Date: 01/11/2024 CLINICAL DATA:  Three-week history of right upper quadrant pain associated with nausea and vomiting and elevated liver function tests EXAM: MRI ABDOMEN WITHOUT AND WITH CONTRAST (INCLUDING MRCP) TECHNIQUE: Multiplanar multisequence MR imaging of the abdomen was performed both before and after the administration of intravenous contrast. Heavily T2-weighted images of the biliary and pancreatic ducts were obtained, and three-dimensional MRCP images were rendered by post processing. CONTRAST:  10mL GADAVIST GADOBUTROL 1 MMOL/ML IV SOLN COMPARISON:  Same day right upper quadrant ultrasound examination FINDINGS: Lower chest:  No acute findings. Hepatobiliary: No mass or other parenchymal abnormality identified. New mild intrahepatic bile duct dilation. Low and medial insertion of the cystic duct into the common bile duct. The common bile duct is mildly prominent measuring 7 mm in diameter with a 6 mm filling defect within the downstream common bile duct at the level of the ampulla. Markedly contracted gallbladder containing gallstones. Pancreas: No mass, inflammatory changes, or other parenchymal abnormality identified. Spleen:  Enlarged spleen measures 13.6 cm, previously 12.3 cm Adrenals/Urinary Tract: No adrenal nodules. No suspicious renal masses identified. No  evidence of hydronephrosis. 4.5 cm exophytic right lower pole simple cyst. No specific follow-up imaging recommended. Stomach/Bowel: Visualized portions within the abdomen are unremarkable. Vascular/Lymphatic: No pathologically enlarged lymph nodes identified. No abdominal aortic aneurysm demonstrated. Other:  None. Musculoskeletal: No suspicious bone lesions identified. IMPRESSION: 1. Findings suspicious for choledocholithiasis with 6 mm stone in the downstream common bile duct near the ampulla. 2. Markedly contracted gallbladder containing gallstones. The cystic duct inserts low and medial into the common bile duct. 3. Mild splenomegaly. Electronically Signed   By: Agustin Cree M.D.   On: 01/11/2024 08:52   US Abdomen Limited RUQ (LIVER/GB) Result Date: 01/11/2024 CLINICAL DATA:  Abdominal pain EXAM: ULTRASOUND ABDOMEN LIMITED RIGHT UPPER QUADRANT COMPARISON:  12/26/2023 FINDINGS: Gallbladder: Contracted gallbladder with 1 cm shadowing stone. No over distension or pericholecystic edema. Common bile duct: Diameter: 7 mm, upper normal.  Where visualized, no filling defect. Liver: No focal lesion identified. Within normal limits in parenchymal echogenicity. Portal vein is patent on color Doppler imaging with normal direction of blood flow towards the liver. Other: Cyst at the lower pole right kidney which appears simple, known from prior abdominal CT. IMPRESSION: Gallstone within the contracted gallbladder. No acute finding. Electronically Signed   By: Tiburcio Pea M.D.   On: 01/11/2024 04:23   CT Renal Stone Study Result Date: 12/26/2023 CLINICAL DATA:  Abdominal and flank pain. EXAM: CT ABDOMEN AND PELVIS WITHOUT CONTRAST TECHNIQUE: Multidetector CT imaging of the abdomen and pelvis was performed following the standard protocol without IV contrast. RADIATION DOSE REDUCTION: This exam was performed according to the departmental dose-optimization program which includes automated exposure control, adjustment of  the mA and/or kV according to patient size and/or use of iterative reconstruction technique. COMPARISON:  CTA abdomen and pelvis 12/16/2022 FINDINGS: Lower chest: No acute abnormality. Hepatobiliary: Small gallstone is present. There is no biliary ductal dilatation. T fat along the falciform ligament he liver is within normal limits. There is likely focal fatty infiltration along the falciform ligament. Pancreas: Unremarkable. No pancreatic ductal dilatation or surrounding inflammatory changes. Spleen: Normal in size without focal abnormality. Adrenals/Urinary Tract: There is a cyst in the inferior pole the right kidney measuring 4.3 cm. Otherwise, the kidneys, adrenal glands and bladder are within normal limits. Stomach/Bowel: Stomach is within normal limits. Appendix appears normal. No evidence of bowel wall thickening, distention, or inflammatory changes. Vascular/Lymphatic: No significant vascular findings are present. No enlarged abdominal or pelvic lymph nodes. Reproductive: Prostate is unremarkable. Other: There are large bilateral inguinal hernias. The left inguinal hernia contains nondilated bowel, unchanged. There is no ascites or free air. Musculoskeletal: Chronic compression deformity of L3 appears unchanged. Degenerative changes affect the hips. IMPRESSION: 1. No acute localizing process in the abdomen or pelvis. 2. Cholelithiasis. 3. Stable large bilateral inguinal hernias. The left inguinal hernia contains nondilated bowel. 4. Right Bosniak I benign renal cyst measuring 4.3 cm. No follow-up imaging is recommended. JACR 2018 Feb; 264-273,  Management of the Incidental Renal Mass on CT, [employer] 2021; [id number], Bosniak Classification of Cystic Renal Masses, Version 2019. Electronically Signed   By: Darliss Cheney M.D.   On: 12/26/2023 01:10   DG Chest Port 1 View Result Date: 12/25/2023 CLINICAL DATA:  Chest pain. EXAM: PORTABLE CHEST 1 VIEW COMPARISON:  Radiograph 4 days ago 12/21/2023, CT  07/02/2023 FINDINGS: Stable cardiomegaly. Unchanged mediastinal contours. There may be a trace left pleural effusion. Mild bibasilar atelectasis without confluent airspace disease. No pulmonary edema. No pneumothorax. Stable osseous structures. IMPRESSION: 1. Chronic cardiomegaly. 2. Mild bibasilar atelectasis with possible small left pleural effusion. Electronically Signed   By: Narda Rutherford M.D.   On: 12/25/2023 23:32   DG Chest 2 View Result Date: 12/21/2023 CLINICAL DATA:  Shortness of breath. EXAM: CHEST - 2 VIEW COMPARISON:  Chest radiograph dated 09/01/2023. FINDINGS: The heart size and mediastinal contours are within normal limits. Both lungs are clear. The visualized skeletal structures are unremarkable. IMPRESSION: No active cardiopulmonary disease. Electronically Signed   By: Elgie Collard M.D.   On: 12/21/2023 15:41       The results of significant diagnostics from this hospitalization (including imaging, microbiology, ancillary and laboratory) are listed below for reference.     Microbiology: No results found for this or any previous visit (from the past 240 hours).   Labs:  CBC: Recent Labs  Lab 01/10/24 1839 01/11/24 0534 01/12/24 0810 01/13/24 0704 01/14/24 0707 01/15/24 0635  WBC 7.5 6.5 6.2 4.9 7.7 7.5  NEUTROABS 5.4  --   --   --   --   --   HGB 15.9 14.8 14.2 14.0 14.2 14.0  HCT 48.0 45.0 45.0 43.0 43.3 43.0  MCV 93.0 93.4 96.2 93.5 93.5 94.1  PLT 258 211 208 181 190 213   BMP &GFR Recent Labs  Lab 01/11/24 0534 01/12/24 0810 01/13/24 0704 01/14/24 0707 01/15/24 0635  NA 136 137 136 138 138  K 3.9 4.7 4.6 4.2 4.5  CL 104 99 98 101 101  CO2 23 28 30 27 29   GLUCOSE 128* 65* 95 118* 120*  BUN 12 8 9  6* 7*  CREATININE 0.94 0.89 0.94 0.72 0.74  CALCIUM 8.0* 8.3* 8.5* 8.3* 8.1*  MG  --   --  1.8 1.9 1.9  PHOS  --   --  3.3  --   --    Estimated Creatinine Clearance: 130.2 mL/min (by C-G formula based on SCr of 0.74 mg/dL). Liver &  Pancreas: Recent Labs  Lab 01/11/24 0534 01/12/24 0810 01/13/24 0704 01/14/24 0707 01/15/24 0635  AST 127* 59* 82* 32 36  ALT 463* 314* 254* 191* 148*  ALKPHOS 159* 149* 177* 168* 145*  BILITOT 5.6* 1.9* 3.7* 1.7* 1.2  PROT 6.1* 6.0* 6.0* 6.1* 6.1*  ALBUMIN 2.8* 2.7* 2.6* 2.6* 2.6*   Recent Labs  Lab 01/10/24 1839 01/14/24 0707  LIPASE 28 20   No results for input(s): "AMMONIA" in the last 168 hours. Diabetic: No results for input(s): "HGBA1C" in the last 72 hours. Recent Labs  Lab 01/13/24 0922  GLUCAP 156*   Cardiac Enzymes: No results for input(s): "CKTOTAL", "CKMB", "CKMBINDEX", "TROPONINI" in the last 168 hours. No results for input(s): "PROBNP" in the last 8760 hours. Coagulation Profile: No results for input(s): "INR", "PROTIME" in the last 168 hours. Thyroid Function Tests: No results for input(s): "TSH", "T4TOTAL", "FREET4", "T3FREE", "THYROIDAB" in the last 72 hours. Lipid Profile: No results for input(s): "CHOL", "HDL", "LDLCALC", "TRIG", "CHOLHDL", "LDLDIRECT"  in the last 72 hours. Anemia Panel: No results for input(s): "VITAMINB12", "FOLATE", "FERRITIN", "TIBC", "IRON", "RETICCTPCT" in the last 72 hours. Urine analysis:    Component Value Date/Time   COLORURINE YELLOW (A) 12/26/2023 0009   APPEARANCEUR CLEAR (A) 12/26/2023 0009   LABSPEC 1.040 (H) 12/26/2023 0009   PHURINE 5.0 12/26/2023 0009   GLUCOSEU >=500 (A) 12/26/2023 0009   HGBUR NEGATIVE 12/26/2023 0009   BILIRUBINUR NEGATIVE 12/26/2023 0009   KETONESUR NEGATIVE 12/26/2023 0009   PROTEINUR NEGATIVE 12/26/2023 0009   NITRITE NEGATIVE 12/26/2023 0009   LEUKOCYTESUR NEGATIVE 12/26/2023 0009   Sepsis Labs: Invalid input(s): "PROCALCITONIN", "LACTICIDVEN"   SIGNED:  Almon Hercules, MD  [facility] 01/15/2024, 10:32 AM

## 2024-01-16 LAB — SURGICAL PATHOLOGY

## 2024-02-05 ENCOUNTER — Emergency Department (HOSPITAL_COMMUNITY): Admission: EM | Admit: 2024-02-05 | Discharge: 2024-02-05 | Discharge status: Against Medical Advice

## 2024-02-05 NOTE — ED Notes (Signed)
 Pt left without being seen.

## 2024-02-12 ENCOUNTER — Telehealth: Payer: Self-pay | Admitting: Family

## 2024-02-12 ENCOUNTER — Encounter: Admitting: Family

## 2024-02-12 NOTE — Telephone Encounter (Signed)
 Pt confirmed appt for 02/13/24

## 2024-02-12 NOTE — Progress Notes (Unsigned)
 Advanced Heart Failure Clinic Note     Patient ID: Kenneth Benson, male    DOB: May 25, 1961, 63 y.o.   MRN: [id number]  HPI  Kenneth Benson is a 63 y/o male with a history of atrial fibrillation, asthma, COPD, current tobacco use and chronic heart failure.   Echo 12/18/22 showed an EF of 45-50% along with mildly elevated PA pressure and moderate Kenneth/ TR  Admitted 12/16/22 due to AF/ HF exacerbation. Diuresed. Initially on bipap and trilogy to be delivered to patient's home.   He presents today for a follow-up visit with a chief complaint of minimal fatigue with moderate exertion. Describes this as chronic in nature. Has associated cough, SOB, wheezing, pedal edema, anxiety and gradual weight gain along with this. Denies any difficulty sleeping, abdominal distention, palpitations, chest pain or dizziness.   Feels like his wheezing is worse and would like a refill on his albuterol inhaler for PRN use. Weighing daily and notes a gradual weight gain as his appetite has increased greatly. Denies any 3 pound or more overnight weight gain.   Past Medical History:  Diagnosis Date   A-fib (HCC) 02/06/2022   AKI (acute kidney injury) (HCC) 12/17/2022   Arrhythmia    atrial fibrillation   Asthma    CHF (congestive heart failure) (HCC)    COPD (chronic obstructive pulmonary disease) (HCC)    Hyperkalemia 12/17/2022   Hypokalemia 04/13/2023   Myocardial injury 04/13/2023   Tobacco abuse    Past Surgical History:  Procedure Laterality Date   CHOLECYSTECTOMY N/A 01/14/2024   Procedure: LAPAROSCOPIC CHOLECYSTECTOMY;  Surgeon: Lysle Rubens, MD;  Location: [facility] OR;  Service: General;  Laterality: N/A;   ERCP N/A 01/13/2024   Procedure: ENDOSCOPIC RETROGRADE CHOLANGIOPANCREATOGRAPHY (ERCP);  Surgeon: Jeani Hawking, MD;  Location: [facility] ENDOSCOPY;  Service: Gastroenterology;  Laterality: N/A;   REMOVAL OF STONES  01/13/2024   Procedure: REMOVAL OF STONES;  Surgeon: Jeani Hawking, MD;  Location: [facility] ENDOSCOPY;   Service: Gastroenterology;;   Dennison Mascot  01/13/2024   Procedure: Dennison Mascot;  Surgeon: Jeani Hawking, MD;  Location: [facility] ENDOSCOPY;  Service: Gastroenterology;;   Family History  Problem Relation Age of Onset   Heart disease Mother    Atrial fibrillation Father    Social History   Tobacco Use   Smoking status: Every Day    Current packs/day: 1.50    Average packs/day: 1.5 packs/day for 27.0 years (40.5 ttl pk-yrs)    Types: Cigarettes   Smokeless tobacco: Not on file   Tobacco comments:    0.5PPD at the most.  trying to quit.  05/11/2023 hfb  Substance Use Topics   Alcohol use: No   No Known Allergies  Prior to Admission medications   Medication Sig Start Date End Date Taking? Authorizing Provider  albuterol (VENTOLIN HFA) 108 (90 Base) MCG/ACT inhaler Inhale 2 puffs into the lungs every 6 (six) hours as needed for wheezing or shortness of breath. 01/23/23  Yes Clarisa Kindred A, FNP  apixaban (ELIQUIS) 5 MG TABS tablet Take 1 tablet (5 mg total) by mouth 2 (two) times daily. 12/29/22  Yes Clarisa Kindred A, FNP  diltiazem (CARDIZEM CD) 360 MG 24 hr capsule Take 1 capsule (360 mg total) by mouth daily. 12/29/22  Yes Athens Lebeau, Inetta Fermo A, FNP  empagliflozin (JARDIANCE) 10 MG TABS tablet Take 1 tablet (10 mg total) by mouth daily. 12/29/22  Yes Clarisa Kindred A, FNP  losartan (COZAAR) 25 MG tablet Take 0.5 tablets (12.5 mg total) by mouth daily. 01/23/23  Yes Clarisa Kindred A, FNP  metoprolol tartrate (LOPRESSOR) 50 MG tablet Take 1 tablet (50 mg total) by mouth 2 (two) times daily. 12/29/22  Yes Kesa Birky A, FNP  mometasone-formoterol (DULERA) 200-5 MCG/ACT AERO Inhale 2 puffs into the lungs 2 (two) times daily. 12/24/22  Yes Marrion Coy, MD  torsemide (DEMADEX) 20 MG tablet Take 2 tablets (40 mg total) by mouth daily. 12/29/22  Yes Fernande Treiber, Inetta Fermo A, FNP  albuterol (PROVENTIL HFA) 108 (90 Base) MCG/ACT inhaler Inhale 2 puffs into the lungs every 6 (six) hours as needed for wheezing. Patient not  taking: Reported on 01/06/2023 02/11/22   Lewie Chamber, MD  amiodarone (PACERONE) 200 MG tablet Take 1 tablet (200 mg total) by mouth 2 (two) times daily. 02/11/22 03/15/22  Lewie Chamber, MD    Review of Systems  Constitutional:  Positive for fatigue. Negative for appetite change.  HENT:  Negative for congestion, postnasal drip and sore throat.   Eyes: Negative.   Respiratory:  Positive for cough, shortness of breath and wheezing. Negative for chest tightness.   Cardiovascular:  Positive for leg swelling. Negative for chest pain and palpitations.  Gastrointestinal:  Negative for abdominal distention and abdominal pain.  Endocrine: Negative.   Genitourinary: Negative.   Musculoskeletal:  Negative for back pain.  Skin: Negative.   Allergic/Immunologic: Negative.   Neurological:  Negative for dizziness and light-headedness.  Hematological:  Negative for adenopathy. Does not bruise/bleed easily.  Psychiatric/Behavioral:  Negative for dysphoric mood and sleep disturbance (sleeping on 1-2 pillows; wearing trilogy). The patient is nervous/anxious.    There were no vitals filed for this visit.  Wt Readings from Last 3 Encounters:  01/15/24 266 lb 8.6 oz (120.9 kg)  12/27/23 280 lb (127 kg)  12/21/23 280 lb (127 kg)   Lab Results  Component Value Date   CREATININE 0.74 01/15/2024   CREATININE 0.72 01/14/2024   CREATININE 0.94 01/13/2024   Physical Exam Vitals and nursing note reviewed.  Constitutional:      Appearance: He is well-developed.  HENT:     Head: Normocephalic and atraumatic.  Neck:     Vascular: No JVD.  Cardiovascular:     Rate and Rhythm: Normal rate. Rhythm irregular.  Pulmonary:     Effort: Pulmonary effort is normal.     Breath sounds: Wheezing (audible; ausculated throughout all lung fields) present. No rhonchi or rales.  Abdominal:     Palpations: Abdomen is soft.     Tenderness: There is no abdominal tenderness.  Musculoskeletal:     Cervical back: Normal  range of motion and neck supple.     Right lower leg: No tenderness. Edema (trace pitting) present.     Left lower leg: No tenderness. Edema (trace pitting) present.  Skin:    General: Skin is warm and dry.  Neurological:     General: No focal deficit present.     Mental Status: He is alert and oriented to person, place, and time.  Psychiatric:        Mood and Affect: Mood is anxious.        Behavior: Behavior normal.    Assessment & Plan:  1: Chronic heart failure with mildly reduced ejection fraction- - NYHA class II - euvolemic - weighing daily & notes gradual weight gain; reminded to call for an overnight weight gain of > 2 pounds or a weekly weight gain of > 5 pounds - weight up 10 pounds from last visit here 1 month ago - jardiance 10mg   daily - metoprolol tartrate 50mg  BID - torsemide 40mg  daily - add 12.5mg  losartan daily; current BP may not allow for entresto - BMP next visit - not adding salt to his food - ReDs clip reading in clinic today was normal at 31% - BNP 12/21/22 was 1985.5 - PharmD reconciled meds w/ patient  2: Atrial fibrillation- - saw cardiology Beatrix Fetters) 03/15/22 - cardizem 360mg ; not indicated in reduced EF so may need to consider managing with metoprolol succinate if able - apixaban 5mg  BID  - diltiazem CD 360mg  daily  3: COPD- - saw pulmonology Sherene Sires) on 01/06/23 - trilogy at bedtime - refilled albuterol for PRN use - PFT's scheduled for 02/09/23 - BMP 12/24/22 showed sodium 139, potassium 3.8, creatinine 1.02 & GFR >60  4: Tobacco use- - smokes daily - cessation discussed - appt at [facility] on 04/28/23   Medication bottles reviewed.   Return in 3 weeks, sooner if needed     Delma Freeze, [place] 02/12/24

## 2024-02-13 ENCOUNTER — Other Ambulatory Visit
Admission: RE | Admit: 2024-02-13 | Discharge: 2024-02-13 | Disposition: A | Discharge status: Home / Self Care | Source: Ambulatory Visit | Attending: Family | Admitting: Family

## 2024-02-13 ENCOUNTER — Telehealth: Payer: Self-pay

## 2024-02-13 ENCOUNTER — Encounter: Payer: Self-pay | Admitting: Family

## 2024-02-13 ENCOUNTER — Ambulatory Visit (HOSPITAL_BASED_OUTPATIENT_CLINIC_OR_DEPARTMENT_OTHER): Admitting: Family

## 2024-02-13 ENCOUNTER — Other Ambulatory Visit: Payer: Self-pay

## 2024-02-13 ENCOUNTER — Other Ambulatory Visit (HOSPITAL_COMMUNITY): Payer: Self-pay

## 2024-02-13 VITALS — BP 125/85 | HR 81 | Wt 272.0 lb

## 2024-02-13 DIAGNOSIS — Z720 Tobacco use: Secondary | ICD-10-CM

## 2024-02-13 DIAGNOSIS — I480 Paroxysmal atrial fibrillation: Secondary | ICD-10-CM | POA: Diagnosis not present

## 2024-02-13 DIAGNOSIS — J449 Chronic obstructive pulmonary disease, unspecified: Secondary | ICD-10-CM | POA: Diagnosis not present

## 2024-02-13 DIAGNOSIS — I5022 Chronic systolic (congestive) heart failure: Secondary | ICD-10-CM | POA: Insufficient documentation

## 2024-02-13 DIAGNOSIS — I5042 Chronic combined systolic (congestive) and diastolic (congestive) heart failure: Secondary | ICD-10-CM | POA: Diagnosis not present

## 2024-02-13 LAB — BASIC METABOLIC PANEL
Anion gap: 6 (ref 5–15)
BUN: 17 mg/dL (ref 8–23)
CO2: 28 mmol/L (ref 22–32)
Calcium: 8.9 mg/dL (ref 8.9–10.3)
Chloride: 106 mmol/L (ref 98–111)
Creatinine, Ser: 0.99 mg/dL (ref 0.61–1.24)
GFR, Estimated: >60 mL/min (ref >60)
Glucose, Bld: 107 mg/dL — ABNORMAL HIGH (ref 70–99)
Potassium: 3.8 mmol/L (ref 3.5–5.1)
Sodium: 140 mmol/L (ref 135–145)

## 2024-02-13 LAB — BRAIN NATRIURETIC PEPTIDE: B Natriuretic Peptide: 107.2 pg/mL — ABNORMAL HIGH (ref 0.0–100.0)

## 2024-02-13 MED ORDER — TORSEMIDE 20 MG PO TABS
20.0000 mg | ORAL_TABLET | Freq: Every day | ORAL | 5 refills | Status: DC
  Filled 2024-02-13 (×2): qty 30, 30d supply, fill #0

## 2024-02-13 MED ORDER — DIGOXIN 125 MCG PO TABS
0.1250 mg | ORAL_TABLET | Freq: Every day | ORAL | 2 refills | Status: DC
  Filled 2024-02-13 (×2): qty 90, 90d supply, fill #0

## 2024-02-13 MED ORDER — AMIODARONE HCL 200 MG PO TABS
200.0000 mg | ORAL_TABLET | Freq: Every day | ORAL | 2 refills | Status: DC
  Filled 2024-02-13: qty 90, 90d supply, fill #0

## 2024-02-13 MED ORDER — SPIRONOLACTONE 25 MG PO TABS: 25.0000 mg | ORAL_TABLET | Freq: Every day | ORAL | Status: DC

## 2024-02-13 MED ORDER — JARDIANCE 10 MG PO TABS
10.0000 mg | ORAL_TABLET | Freq: Every day | ORAL | 2 refills | Status: DC
  Filled 2024-02-13 – 2024-02-20 (×2): qty 90, 90d supply, fill #0
  Filled 2024-06-06: qty 90, 90d supply, fill #1

## 2024-02-13 MED ORDER — TORSEMIDE 20 MG PO TABS
40.0000 mg | ORAL_TABLET | Freq: Every day | ORAL | 0 refills | Status: DC
  Filled 2024-02-13: qty 6, 3d supply, fill #0

## 2024-02-13 MED ORDER — ROSUVASTATIN CALCIUM 5 MG PO TABS
5.0000 mg | ORAL_TABLET | Freq: Every day | ORAL | 2 refills | Status: DC
  Filled 2024-02-13 (×2): qty 90, 90d supply, fill #0

## 2024-02-13 NOTE — Progress Notes (Signed)
 Kenneth Benson  Guideline-Directed Medical Therapy/Evidence Based Medicine  ACE/ARB/ARNI: N/A Beta Blocker: Metoprolol tartrate 50 mg twice daily Aldosterone Antagonist: Spironolactone 25 mg daily Diuretic:  N/A SGLT2i: Empagliflozin 10 mg daily  Adherence Assessment  Do you ever forget to take your medication? [] Yes [x] No  Do you ever skip doses due to side effects? [] Yes [x] No  Do you have trouble affording your medicines? [x] Yes [] No  Are you ever unable to pick up your medication due to transportation difficulties?  [] Yes [x] No  Do you ever stop taking your medications because you don't believe they are helping? [] Yes [x] No  Do you check your weight daily? Unable to since currently homeless [] Yes [x] No   Adherence strategy: None; offered pill box but patient declined  Barriers to obtaining medications: Homelessness, cost   Vital signs: HR 81, BP 125/85, weight 266 lbs.  ECHO: Date 11/2022, EF 45-50%     Latest Ref Rng & Units 01/15/2024    6:35 AM 01/14/2024    7:07 AM 01/13/2024    7:04 AM  BMP  Glucose 70 - 99 mg/dL 914  782  95   BUN 8 - 23 mg/dL 7  6  9    Creatinine 0.61 - 1.24 mg/dL 9.56  2.13  0.86   Sodium 135 - 145 mmol/L 138  138  136   Potassium 3.5 - 5.1 mmol/L 4.5  4.2  4.6   Chloride 98 - 111 mmol/L 101  101  98   CO2 22 - 32 mmol/L 29  27  30    Calcium 8.9 - 10.3 mg/dL 8.1  8.3  8.5    Past Medical History:  Diagnosis Date   A-fib (HCC) 02/06/2022   AKI (acute kidney injury) (HCC) 12/17/2022   Arrhythmia    atrial fibrillation   Asthma    CHF (congestive heart failure) (HCC)    COPD (chronic obstructive pulmonary disease) (HCC)    Hyperkalemia 12/17/2022   Hypokalemia 04/13/2023   Myocardial injury 04/13/2023   Tobacco abuse    ASSESSMENT 63 year old male who presents to the HF clinic for a follow-up visit. PMH is significant for atrial fibrillation, asthma, COPD,  cholelithiasis, tobacco use and chronic heart failure. Patient doesn't currently have a PCP and is running low on some medications. He also reports that he was once on torsemide and losartan but was taken off of them months ago, but unsure of the reasoning for this. He also is homeless and is unable to weigh himself daily. He does report going to [employer] daily to measure his blood pressure.   Recent ED Visit (past 6 months):  01/23/2024, [facility] - trichomonas vaginalis treatment  01/10/2024, [facility] - biliary colic  12/25/2023, [facility] - unspecified chest pain  12/21/2023, [facility] - COPD exacerbation  08/22/2023, [facility] - atrial fibrillation with RVR  PLAN Recommendations discussed with NP  Patient clearly fluid overloaded and not currently on any diuretics; weight up 6 lbs from last measurement on 01/15/24 Plan to initiate torsemide 40 mg daily x 3 days, then decrease to 20 mg daily  Consider initiation of RAAS inhibitor eventually - was previously on losartan; copay for 90 day supply of Entresto is $4.00   COUNSELING POINTS/CLINICAL PEARLS  Torsemide Drug causes sun-sensitivity. Advise patient to use sunscreen and avoid tanning beds. Patient should avoid activities requiring coordination until drug effects are realized, as drug may cause dizziness, vertigo, or blurred vision. This drug may cause excessive urination,  hyperglycemia, hyperuricemia, constipation, diarrhea, loss of appetite, nausea, vomiting, purpuric disorder, cramps, spasticity, asthenia, headache, paresthesia, or scaling eczema. Instruct patient to report unusual bleeding/bruising or signs/symptoms of hypotension, infection, pancreatitis, or ototoxicity (tinnitus, hearing impairment). Advise patient to report signs/symptoms of a severe skin reactions (flu-like symptoms, spreading red rash, or skin/mucous membrane blistering) or erythema multiforme. Instruct patient to eat high-potassium foods during drug therapy, as directed by healthcare professional.   Patient should not drink alcohol while taking this drug. Warn patient to avoid use of nonprescription NSAID products without first discussing it with their healthcare provider.  Time spent: 20 minutes  Kenneth Benson, PharmD Pharmacy Resident  02/13/2024 10:21 AM

## 2024-02-13 NOTE — Progress Notes (Signed)
 ReDS Vest / Clip - 02/13/24 1300       ReDS Vest / Clip   Station Marker D    Ruler Value 33    ReDS Value Range Moderate volume overload    ReDS Actual Value 39

## 2024-02-13 NOTE — Telephone Encounter (Signed)
 Patient Advocate Encounter  Test billing for Sherryll Burger shows that the copay for this medication would be $4 for 30 or 90 day supply. Would recommend sending 90 day rx.  Burnell Blanks, CPhT Rx Patient Advocate Phone: [phone]

## 2024-02-13 NOTE — Patient Instructions (Addendum)
 Medication Changes:  START TORSEMIDE 40 MG ONCE DAILY FOR 3 DAYS  THEN, DECREASE TO TORSEMIDE 20 MG ONCE DAILY  Lab Work:  Go over to the [facility]ALL. Go pass the gift shop and have your blood work completed.  We will only call you if the results are abnormal or if the provider would like to make medication changes.  THEN HAVE THE COURTESY CAR DRIVE YOU OVER TO THE PHARMACY TO GET YOUR PRESCRIPTIONS.   Special Instructions // Education:  Here below are some of the local [facility] Offices for your area:  [facility] [address] [phone]  [facility] [address] [place] [phone]   Referral:  We have placed a referral today to [facility]. They should reach out to you within 1-2 weeks. If they do not, please reach out to them. Their information is below on this AVS.  [facility] - Card[facility] [address] [phone]   Follow-Up in: 2 WEEKS WITH Kenneth Kindred, Kenneth Benson.  At the Advanced Heart Failure Clinic, you and your health needs are our priority. We have a designated team specialized in the treatment of Heart Failure. This Care Team includes your primary Heart Failure Specialized Cardiologist (physician), Advanced Practice Providers (APPs- Physician Assistants and Nurse Practitioners), and Pharmacist who all work together to provide you with the care you need, when you need it.   You may see any of the following providers on your designated Care Team at your next follow up:  Dr. Arvilla Meres Dr. Marca Ancona Dr. Dorthula Nettles Dr. Theresia Bough Kenneth Kindred, Kenneth Benson Enos Fling, RPH-CPP  Please be sure to bring in all your medications bottles to every appointment.   Need to Contact us:  If you have any questions or concerns before your next appointment please send [place] a message through [place] or call our office at [phone].    TO LEAVE A MESSAGE FOR THE NURSE  SELECT OPTION 2, PLEASE LEAVE A MESSAGE INCLUDING: YOUR NAME DATE OF BIRTH CALL BACK NUMBER REASON FOR CALL**this is important as we prioritize the call backs  YOU WILL RECEIVE A CALL BACK THE SAME DAY AS LONG AS YOU CALL BEFORE 4:00 PM

## 2024-02-14 ENCOUNTER — Telehealth: Payer: Self-pay

## 2024-02-14 ENCOUNTER — Other Ambulatory Visit (HOSPITAL_COMMUNITY): Payer: Self-pay

## 2024-02-14 NOTE — Telephone Encounter (Signed)
 Advanced Heart Failure Patient Advocate Encounter  Prior authorization for [place] Pepper has been submitted and approved. Test billing returns $4 for 90 day supply.  Key: [id number] Effective: 02/13/2024 to 02/12/2025  Burnell Blanks, CPhT Rx Patient Advocate Phone: [phone]

## 2024-02-20 ENCOUNTER — Other Ambulatory Visit: Payer: Self-pay | Admitting: Family

## 2024-02-20 ENCOUNTER — Other Ambulatory Visit: Payer: Self-pay

## 2024-02-21 ENCOUNTER — Other Ambulatory Visit: Payer: Self-pay

## 2024-02-21 MED ORDER — SPIRONOLACTONE 25 MG PO TABS
25.0000 mg | ORAL_TABLET | Freq: Every day | ORAL | Status: DC
Start: 2024-02-21 — End: 2024-03-21

## 2024-02-28 ENCOUNTER — Telehealth: Payer: Self-pay | Admitting: Family

## 2024-02-28 NOTE — Telephone Encounter (Incomplete)
 Called to confirm/remind patient of their appointment at the Advanced Heart Failure Clinic on 02/29/24***.   Appointment:   [x] Confirmed  [] Left mess   [] No answer/No voice mail  [] Phone not in service  Patient reminded to bring all medications and/or complete list.  Confirmed patient has transportation. Gave directions, instructed to utilize valet parking.

## 2024-02-29 ENCOUNTER — Encounter: Payer: Self-pay | Admitting: Family

## 2024-02-29 ENCOUNTER — Ambulatory Visit: Attending: Family | Admitting: Family

## 2024-02-29 ENCOUNTER — Other Ambulatory Visit: Payer: Self-pay

## 2024-02-29 VITALS — BP 99/73 | HR 70 | Wt 265.2 lb

## 2024-02-29 DIAGNOSIS — F17210 Nicotine dependence, cigarettes, uncomplicated: Secondary | ICD-10-CM | POA: Diagnosis not present

## 2024-02-29 DIAGNOSIS — I480 Paroxysmal atrial fibrillation: Secondary | ICD-10-CM | POA: Insufficient documentation

## 2024-02-29 DIAGNOSIS — Z5901 Sheltered homelessness: Secondary | ICD-10-CM | POA: Diagnosis not present

## 2024-02-29 DIAGNOSIS — J449 Chronic obstructive pulmonary disease, unspecified: Secondary | ICD-10-CM | POA: Diagnosis not present

## 2024-02-29 DIAGNOSIS — Z5982 Transportation insecurity: Secondary | ICD-10-CM | POA: Insufficient documentation

## 2024-02-29 DIAGNOSIS — J4489 Other specified chronic obstructive pulmonary disease: Secondary | ICD-10-CM | POA: Diagnosis not present

## 2024-02-29 DIAGNOSIS — Z79899 Other long term (current) drug therapy: Secondary | ICD-10-CM | POA: Diagnosis not present

## 2024-02-29 DIAGNOSIS — Z8249 Family history of ischemic heart disease and other diseases of the circulatory system: Secondary | ICD-10-CM | POA: Insufficient documentation

## 2024-02-29 DIAGNOSIS — I5022 Chronic systolic (congestive) heart failure: Secondary | ICD-10-CM | POA: Insufficient documentation

## 2024-02-29 DIAGNOSIS — Z720 Tobacco use: Secondary | ICD-10-CM | POA: Diagnosis not present

## 2024-02-29 DIAGNOSIS — I5042 Chronic combined systolic (congestive) and diastolic (congestive) heart failure: Secondary | ICD-10-CM | POA: Diagnosis not present

## 2024-02-29 MED ORDER — LOSARTAN POTASSIUM 25 MG PO TABS
25.0000 mg | ORAL_TABLET | Freq: Every day | ORAL | 5 refills | Status: DC
  Filled 2024-02-29 (×2): qty 30, 30d supply, fill #0
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
## 2024-02-29 NOTE — Progress Notes (Signed)
 ReDS Vest / Clip - 02/29/24 1342       ReDS Vest / Clip   Station Marker D    Ruler Value 32    ReDS Value Range Moderate volume overload    ReDS Actual Value 38

## 2024-02-29 NOTE — Patient Instructions (Addendum)
 Medication Changes:  START LOSARTAN 25 MG ONCE DAILY.   Lab Work:  Go DOWN to LOWER LEVEL (LL) to have your blood work completed inside of [employer] office.  We will only call you if the results are abnormal or if the provider would like to make medication changes.    Special Instructions // Education:  [facility] [address] [place] [address] Phone: [phone]  Appointment: Tuesday, April 30, 2024 at 3:20 PM. Please arrive 15 minutes early and bring ID and insurance card.   Follow-Up in: 3-4 with one of our Doctors.  At the [facility], you and your health needs are our priority. We have a designated team specialized in the treatment of Heart Failure. This Care Team includes your primary Heart Failure Specialized Cardiologist (physician), Advanced Practice Providers (APPs- Physician Assistants and Nurse Practitioners), and Pharmacist who all work together to provide you with the care you need, when you need it.   You may see any of the following providers on your designated Care Team at your next follow up:  Dr. Arvilla Meres Dr. Marca Ancona Dr. Dorthula Nettles Dr. Theresia Bough Clarisa Kindred, FNP Enos Fling, RPH-CPP  Please be sure to bring in all your medications bottles to every appointment.   Need to Contact us:  If you have any questions or concerns before your next appointment please send [place] a message through [place] or call our office at [phone].    TO LEAVE A MESSAGE FOR THE NURSE SELECT OPTION 2, PLEASE LEAVE A MESSAGE INCLUDING: YOUR NAME DATE OF BIRTH CALL BACK NUMBER REASON FOR CALL**this is important as we prioritize the call backs  YOU WILL RECEIVE A CALL BACK THE SAME DAY AS LONG AS YOU CALL BEFORE 4:00 PM

## 2024-03-01 LAB — BASIC METABOLIC PANEL WITH GFR
BUN/Creatinine Ratio: 16 (ref 10–24)
BUN: 17 mg/dL (ref 8–27)
CO2: 30 mmol/L — ABNORMAL HIGH (ref 20–29)
Calcium: 8.9 mg/dL (ref 8.6–10.2)
Chloride: 99 mmol/L (ref 96–106)
Creatinine, Ser: 1.04 mg/dL (ref 0.76–1.27)
Glucose: 112 mg/dL — ABNORMAL HIGH (ref 70–99)
Potassium: 3.8 mmol/L (ref 3.5–5.2)
Sodium: 144 mmol/L (ref 134–144)
eGFR: 82 mL/min/{1.73_m2} (ref >59)

## 2024-03-13 ENCOUNTER — Telehealth: Payer: Self-pay | Admitting: Acute Care

## 2024-03-13 DIAGNOSIS — Z122 Encounter for screening for malignant neoplasm of respiratory organs: Secondary | ICD-10-CM

## 2024-03-13 DIAGNOSIS — F17210 Nicotine dependence, cigarettes, uncomplicated: Secondary | ICD-10-CM

## 2024-03-13 DIAGNOSIS — Z87891 Personal history of nicotine dependence: Secondary | ICD-10-CM

## 2024-03-13 NOTE — Telephone Encounter (Signed)
 Lung Cancer Screening Narrative/Criteria Questionnaire (Cigarette Smokers Only- No Cigars/Pipes/vapes)   Kenneth Benson   SDMV:04/09/2024 at 10:00am with [name]        1961-06-25   LDCT: 04/10/2024 at 1:30pm at [facility]    63 y.o.   Phone: [phone]  Lung Screening Narrative (confirm age 28-77 yrs Medicare / 50-80 yrs Private pay insurance)   Insurance information:medicaid   Referring Provider:Dr. Margery Sheets, hospitalist    This screening involves an initial phone call with a team member from our program. It is called a shared decision making visit. The initial meeting is required by  insurance and Medicare to make sure you understand the program. This appointment takes about 15-20 minutes to complete. You will complete the screening scan at your scheduled date/time.  This scan takes about 5-10 minutes to complete. You can eat and drink normally before and after the scan.  Criteria questions for Lung Cancer Screening:   Are you a current or former smoker? Current , states he quit 3 weeks ago. Age began smoking: 63yo   If you are a former smoker, what year did you quit smoking? N/A(within 15 yrs)   To calculate your smoking history, I need an accurate estimate of how many packs of cigarettes you smoked per day and for how many years. (Not just the number of PPD you are now smoking)   Years smoking 28 x Packs per day 1 = Pack years 28   (at least 20 pack yrs)   (Make sure they understand that we need to know how much they have smoked in the past, not just the number of PPD they are smoking now)  Do you have a personal history of cancer?  No    Do you have a family history of cancer? No  Are you coughing up blood?  No  Have you had unexplained weight loss of 15 lbs or more in the last 6 months? No  It looks like you meet all criteria.  When would be a good time for us  to schedule you for this screening?   Additional information: N/A

## 2024-03-20 ENCOUNTER — Telehealth: Payer: Self-pay | Admitting: Family
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
## 2024-03-20 NOTE — Telephone Encounter (Signed)
 Called to confirm/remind patient of their appointment at the [facility] on 03/21/24.   Appointment:   [x] Confirmed  [] Left mess   [] No answer/No voice mail  [] VM Full/unable to leave message  [] Phone not in service  Patient reminded to bring all medications and/or complete list.  Confirmed patient has transportation. Gave directions, instructed to utilize valet parking.

## 2024-03-21 ENCOUNTER — Encounter: Payer: Self-pay | Admitting: Family

## 2024-03-21 ENCOUNTER — Ambulatory Visit: Attending: Internal Medicine | Admitting: Family

## 2024-03-21 ENCOUNTER — Other Ambulatory Visit: Payer: Self-pay

## 2024-03-21 VITALS — BP 102/69 | HR 70 | Wt 279.6 lb

## 2024-03-21 DIAGNOSIS — J449 Chronic obstructive pulmonary disease, unspecified: Secondary | ICD-10-CM

## 2024-03-21 DIAGNOSIS — Z79899 Other long term (current) drug therapy: Secondary | ICD-10-CM | POA: Diagnosis not present

## 2024-03-21 DIAGNOSIS — I480 Paroxysmal atrial fibrillation: Secondary | ICD-10-CM | POA: Diagnosis not present

## 2024-03-21 DIAGNOSIS — Z7901 Long term (current) use of anticoagulants: Secondary | ICD-10-CM | POA: Diagnosis not present

## 2024-03-21 DIAGNOSIS — I5022 Chronic systolic (congestive) heart failure: Secondary | ICD-10-CM

## 2024-03-21 DIAGNOSIS — J4489 Other specified chronic obstructive pulmonary disease: Secondary | ICD-10-CM | POA: Diagnosis not present

## 2024-03-21 DIAGNOSIS — K8020 Calculus of gallbladder without cholecystitis without obstruction: Secondary | ICD-10-CM | POA: Diagnosis not present

## 2024-03-21 DIAGNOSIS — R0602 Shortness of breath: Secondary | ICD-10-CM | POA: Diagnosis present

## 2024-03-21 DIAGNOSIS — I4891 Unspecified atrial fibrillation: Secondary | ICD-10-CM | POA: Diagnosis not present

## 2024-03-21 DIAGNOSIS — Z720 Tobacco use: Secondary | ICD-10-CM

## 2024-03-21 DIAGNOSIS — F17210 Nicotine dependence, cigarettes, uncomplicated: Secondary | ICD-10-CM | POA: Diagnosis not present

## 2024-03-21 MED ORDER — TORSEMIDE 20 MG PO TABS
20.0000 mg | ORAL_TABLET | Freq: Every day | ORAL | 5 refills | Status: DC
  Filled 2024-03-21: qty 30, 30d supply, fill #0

## 2024-03-21 MED ORDER — SPIRONOLACTONE 25 MG PO TABS
25.0000 mg | ORAL_TABLET | Freq: Every day | ORAL | 6 refills | Status: DC
  Filled 2024-03-21: qty 30, 30d supply, fill #0
  Filled 2024-04-16: qty 30, 30d supply, fill #1

## 2024-03-21 MED ORDER — ALBUTEROL SULFATE HFA 108 (90 BASE) MCG/ACT IN AERS
2.0000 | INHALATION_SPRAY | Freq: Four times a day (QID) | RESPIRATORY_TRACT | 0 refills | Status: DC | PRN
  Filled 2024-03-21: qty 18, 30d supply, fill #0

## 2024-03-21 MED ORDER — VITAMIN D (ERGOCALCIFEROL) 1.25 MG (50000 UNIT) PO CAPS
50000.0000 [IU] | ORAL_CAPSULE | ORAL | 0 refills | Status: DC
  Filled 2024-03-21: qty 4, 28d supply, fill #0

## 2024-03-21 MED ORDER — METOLAZONE 2.5 MG PO TABS
2.5000 mg | ORAL_TABLET | ORAL | 0 refills | Status: DC
  Filled 2024-03-21: qty 3, 6d supply, fill #0

## 2024-03-21 MED ORDER — LOSARTAN POTASSIUM 25 MG PO TABS
25.0000 mg | ORAL_TABLET | Freq: Every day | ORAL | 5 refills | Status: DC
  Filled 2024-03-21 – 2024-03-22 (×2): qty 30, 30d supply, fill #0

## 2024-03-21 MED ORDER — POTASSIUM CHLORIDE CRYS ER 20 MEQ PO TBCR
20.0000 meq | EXTENDED_RELEASE_TABLET | ORAL | 0 refills | Status: DC
  Filled 2024-03-21: qty 30, 30d supply, fill #0

## 2024-03-21 NOTE — Patient Instructions (Addendum)
 Medication Changes:  TAKE Metolazone  2.5mg  (1 tab) every other day for THREE DAYS ONLY  Please take Potassium 20meq (1 tab) every day that you take the metolazone  (three days only).    Follow-Up in: Please follow up with the [facility] in 1 weeks with [name], FNP.  At the [facility], you and your health needs are our priority. We have a designated team specialized in the treatment of Heart Failure. This Care Team includes your primary Heart Failure Specialized Cardiologist (physician), Advanced Practice Providers (APPs- Physician Assistants and Nurse Practitioners), and Pharmacist who all work together to provide you with the care you need, when you need it.   You may see any of the following providers on your designated Care Team at your next follow up:  Dr. Jules Oar Dr. Peder Bourdon Dr. Alwin Baars Dr. Judyth Nunnery [name], FNP Bevely Brush, RPH-CPP  Please be sure to bring in all your medications bottles to every appointment.   Need to Contact Us :  If you have any questions or concerns before your next appointment please send us  a message through [place] or call our office at [phone].    TO LEAVE A MESSAGE FOR THE NURSE SELECT OPTION 2, PLEASE LEAVE A MESSAGE INCLUDING: YOUR NAME DATE OF BIRTH CALL BACK NUMBER REASON FOR CALL**this is important as we prioritize the call backs  YOU WILL RECEIVE A CALL BACK THE SAME DAY AS LONG AS YOU CALL BEFORE 4:00 PM

## 2024-03-22 ENCOUNTER — Other Ambulatory Visit: Payer: Self-pay

## 2024-03-28 ENCOUNTER — Other Ambulatory Visit: Payer: Self-pay

## 2024-03-28 ENCOUNTER — Telehealth: Payer: Self-pay | Admitting: Family

## 2024-03-28 ENCOUNTER — Encounter: Admitting: Family

## 2024-03-28 MED ORDER — CYANOCOBALAMIN 1000 MCG PO TABS
1000.0000 ug | ORAL_TABLET | Freq: Every day | ORAL | 1 refills | Status: DC
  Filled 2024-03-28: qty 90, 90d supply, fill #0

## 2024-03-28 MED ORDER — ERGOCALCIFEROL 1.25 MG (50000 UT) PO CAPS
1.0000 | ORAL_CAPSULE | ORAL | 1 refills | Status: DC
  Filled 2024-03-28: qty 12, 84d supply, fill #0

## 2024-03-28 MED ORDER — ALBUTEROL SULFATE HFA 108 (90 BASE) MCG/ACT IN AERS
1.0000 | INHALATION_SPRAY | RESPIRATORY_TRACT | 1 refills | Status: DC | PRN
Start: 2024-03-28 — End: 2024-06-06
  Filled 2024-03-28 – 2024-04-16 (×2): qty 54, 90d supply, fill #0

## 2024-03-28 MED ORDER — TRELEGY ELLIPTA 100-62.5-25 MCG/ACT IN AEPB
1.0000 | INHALATION_SPRAY | Freq: Every day | RESPIRATORY_TRACT | 1 refills | Status: DC
Start: 2024-03-28 — End: 2024-04-10
  Filled 2024-03-28: qty 60, 30d supply, fill #0

## 2024-03-28 NOTE — Telephone Encounter (Signed)
 Called to confirm/remind patient of their appointment at the [facility] on 03/29/24.   Appointment:   [x] Confirmed  [] Left mess   [] No answer/No voice mail  [] VM Full/unable to leave message  [] Phone not in service  Patient reminded to bring all medications and/or complete list.  Confirmed patient has transportation. Gave directions, instructed to utilize valet parking.

## 2024-03-29 ENCOUNTER — Other Ambulatory Visit
Admission: RE | Admit: 2024-03-29 | Discharge: 2024-03-29 | Disposition: A | Discharge status: Home / Self Care | Source: Ambulatory Visit | Attending: Family | Admitting: Family

## 2024-03-29 ENCOUNTER — Ambulatory Visit (HOSPITAL_BASED_OUTPATIENT_CLINIC_OR_DEPARTMENT_OTHER): Admitting: Family

## 2024-03-29 ENCOUNTER — Other Ambulatory Visit: Payer: Self-pay

## 2024-03-29 ENCOUNTER — Telehealth: Payer: Self-pay | Admitting: Family

## 2024-03-29 ENCOUNTER — Encounter: Payer: Self-pay | Admitting: Family

## 2024-03-29 ENCOUNTER — Other Ambulatory Visit: Payer: Self-pay | Admitting: Family

## 2024-03-29 VITALS — BP 92/66 | HR 53 | Wt 277.0 lb

## 2024-03-29 DIAGNOSIS — I5022 Chronic systolic (congestive) heart failure: Secondary | ICD-10-CM

## 2024-03-29 DIAGNOSIS — Z720 Tobacco use: Secondary | ICD-10-CM

## 2024-03-29 DIAGNOSIS — J449 Chronic obstructive pulmonary disease, unspecified: Secondary | ICD-10-CM | POA: Diagnosis not present

## 2024-03-29 DIAGNOSIS — I480 Paroxysmal atrial fibrillation: Secondary | ICD-10-CM

## 2024-03-29 LAB — BASIC METABOLIC PANEL WITH GFR
Anion gap: 11 (ref 5–15)
BUN: 28 mg/dL — ABNORMAL HIGH (ref 8–23)
CO2: 29 mmol/L (ref 22–32)
Calcium: 8.3 mg/dL — ABNORMAL LOW (ref 8.9–10.3)
Chloride: 96 mmol/L — ABNORMAL LOW (ref 98–111)
Creatinine, Ser: 1.22 mg/dL (ref 0.61–1.24)
GFR, Estimated: >60 mL/min (ref >60)
Glucose, Bld: 100 mg/dL — ABNORMAL HIGH (ref 70–99)
Potassium: 3.9 mmol/L (ref 3.5–5.1)
Sodium: 136 mmol/L (ref 135–145)

## 2024-03-29 LAB — BRAIN NATRIURETIC PEPTIDE: B Natriuretic Peptide: 74.4 pg/mL (ref 0.0–100.0)

## 2024-03-29 LAB — DIGOXIN LEVEL: Digoxin Level: 1 ng/mL (ref 0.8–2.0)

## 2024-03-29 MED ORDER — TORSEMIDE 20 MG PO TABS
40.0000 mg | ORAL_TABLET | Freq: Every day | ORAL | 5 refills | Status: DC
  Filled 2024-03-29: qty 60, 30d supply, fill #0

## 2024-03-29 MED ORDER — APIXABAN 5 MG PO TABS
5.0000 mg | ORAL_TABLET | Freq: Two times a day (BID) | ORAL | 3 refills | Status: DC
  Filled 2024-03-29: qty 60, 30d supply, fill #0
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
## 2024-03-29 NOTE — Telephone Encounter (Signed)
 Received lab results and will start potassium 20meq daily (due to increasing torsemide ) and will STOP digoxin  due to rising level. It is now 1.0 and he's having intermittent tremors. Spoke with patient and he verbalized understanding.
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
## 2024-03-29 NOTE — Patient Instructions (Addendum)
 Medication Changes:  HOLD Losartan   INCREASE TORSEMIDE  TO 40 MG ONCE DAILY   Lab Work:  Go DOWN to LOWER LEVEL (LL) to have your blood work completed inside of [employer] office.  We will only call you if the results are abnormal or if the provider would like to make medication changes.   Follow-Up in: 1 week with [name], FNP  At the [facility], you and your health needs are our priority. We have a designated team specialized in the treatment of Heart Failure. This Care Team includes your primary Heart Failure Specialized Cardiologist (physician), Advanced Practice Providers (APPs- Physician Assistants and Nurse Practitioners), and Pharmacist who all work together to provide you with the care you need, when you need it.   You may see any of the following providers on your designated Care Team at your next follow up:  Dr. Jules Oar Dr. Peder Bourdon Dr. Alwin Baars Dr. Judyth Nunnery [name], FNP Bevely Brush, RPH-CPP  Please be sure to bring in all your medications bottles to every appointment.   Need to Contact Us :  If you have any questions or concerns before your next appointment please send us  a message through [place] or call our office at [phone].    TO LEAVE A MESSAGE FOR THE NURSE SELECT OPTION 2, PLEASE LEAVE A MESSAGE INCLUDING: YOUR NAME DATE OF BIRTH CALL BACK NUMBER REASON FOR CALL**this is important as we prioritize the call backs  YOU WILL RECEIVE A CALL BACK THE SAME DAY AS LONG AS YOU CALL BEFORE 4:00 PM

## 2024-03-29 NOTE — Progress Notes (Signed)
 ReDS Vest / Clip - 03/29/24 1100       ReDS Vest / Clip   Station Marker D    Ruler Value 32    ReDS Value Range High volume overload    ReDS Actual Value 48

## 2024-04-01 ENCOUNTER — Other Ambulatory Visit: Payer: Self-pay

## 2024-04-09 ENCOUNTER — Ambulatory Visit: Admitting: Physician Assistant

## 2024-04-09 ENCOUNTER — Other Ambulatory Visit: Payer: Self-pay

## 2024-04-09 ENCOUNTER — Encounter: Payer: Self-pay | Admitting: Physician Assistant

## 2024-04-09 DIAGNOSIS — F17210 Nicotine dependence, cigarettes, uncomplicated: Secondary | ICD-10-CM | POA: Diagnosis not present
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
## 2024-04-09 NOTE — Progress Notes (Signed)
 Virtual Visit via Telephone Note  I connected with Kenneth Benson on 04/09/24 at  9:53 AM by telephone and verified that I am speaking with the correct person using two identifiers.  Location: Patient: home Provider: working virtually from home   I discussed the limitations, risks, security and privacy concerns of performing an evaluation and management service by telephone and the availability of in person appointments. I also discussed with the patient that there may be a patient responsible charge related to this service. The patient expressed understanding and agreed to proceed.     Shared Decision Making Visit Lung Cancer Screening Program [phone])   Eligibility: Age 63 Pack Years Smoking History Calculation 28 (# packs/per year x # years smoked) Recent History of coughing up blood  No Unexplained weight loss? No ( >Than 15 pounds within the last 6 months ) Prior History Lung / other cancer No (Diagnosis within the last 5 years already requiring surveillance chest CT Scans). Smoking Status Current Smoker  Visit Components: Discussion included one or more decision making aids. Yes Discussion included risk/benefits of screening. Yes Discussion included potential follow up diagnostic testing for abnormal scans. Yes Discussion included meaning and risk of over diagnosis. Yes Discussion included meaning and risk of False Positives. Yes Discussion included meaning of total radiation exposure. Yes  Counseling Included: Importance of adherence to annual lung cancer LDCT screening. Yes Impact of comorbidities on ability to participate in the program. Yes Ability and willingness to under diagnostic treatment: Yes  Smoking Cessation Counseling: Current Smokers:  Discussed importance of smoking cessation. Yes Information about tobacco cessation classes and interventions provided to patient. Yes Symptomatic Patient. No Diagnosis Code: Tobacco Use Z72.0 Asymptomatic Patient  Yes  Counseling (Intermediate counseling: > three minutes counseling) U9811 Information about tobacco cessation classes and interventions provided to patient. Yes Written Order for Lung Cancer Screening with LDCT placed in Epic. Yes (CT Chest Lung Cancer Screening Low Dose W/O CM) [id number] Z12.2-Screening of respiratory organs Z87.891-Personal history of nicotine  dependence   I have spent 25 minutes of face to face/ virtual visit  time with the patient discussing the risks and benefits of lung cancer screening. We discussed the above noted topics. We paused at intervals to allow for questions to be asked and answered to ensure understanding.We discussed that the single most powerful action that anyone can take to decrease their risk of developing lung cancer is to quit smoking.  We discussed options for tools to aid in quitting smoking including nicotine  replacement therapy, non-nicotine  medications, support groups, Quit Smart classes, and behavior modification. We discussed that often times setting smaller, more achievable goals, such as eliminating 1 cigarette a day for a week and then 2 cigarettes a day for a week can be helpful in slowly decreasing the number of cigarettes smoked. I provided  them  with smoking cessation  information  with contact information for community resources, classes, free nicotine  replacement therapy, and access to mobile apps, text messaging, and on-line smoking cessation help. I have also provided  them  the office contact information in the event they have any questions. We discussed the time and location of the scan, and that either Pearl Bottcher RN, Deann Exon, RN  or I will call / send a letter with the results within 24-72 hours of receiving them. The patient verbalized understanding of all of  the above and had no further questions upon leaving the office. They have my contact information in the event they have any further  questions.  I spent 3 minutes counseling  on smoking cessation and the health risks of continued tobacco abuse.  I explained to the patient that there has been a high incidence of coronary artery disease noted on these exams. I explained that this is a non-gated exam therefore degree or severity cannot be determined. This patient is on statin therapy. I have asked the patient to follow-up with their PCP regarding any incidental finding of coronary artery disease and management with diet or medication as their PCP  feels is clinically indicated. The patient verbalized understanding of the above and had no further questions upon completion of the visit.    Patt Boozer Fergus Throne, PA-C

## 2024-04-09 NOTE — Patient Instructions (Signed)

## 2024-04-10 ENCOUNTER — Encounter: Admitting: Family

## 2024-04-10 ENCOUNTER — Other Ambulatory Visit (HOSPITAL_COMMUNITY): Payer: Self-pay

## 2024-04-10 ENCOUNTER — Ambulatory Visit: Attending: Family | Admitting: Family

## 2024-04-10 ENCOUNTER — Encounter: Payer: Self-pay | Admitting: Family

## 2024-04-10 ENCOUNTER — Ambulatory Visit: Admission: RE | Admit: 2024-04-10 | Source: Ambulatory Visit

## 2024-04-10 ENCOUNTER — Other Ambulatory Visit: Payer: Self-pay

## 2024-04-10 VITALS — BP 109/78 | HR 80 | Wt 273.6 lb

## 2024-04-10 DIAGNOSIS — F17210 Nicotine dependence, cigarettes, uncomplicated: Secondary | ICD-10-CM | POA: Insufficient documentation

## 2024-04-10 DIAGNOSIS — Z79899 Other long term (current) drug therapy: Secondary | ICD-10-CM | POA: Diagnosis not present

## 2024-04-10 DIAGNOSIS — I4891 Unspecified atrial fibrillation: Secondary | ICD-10-CM | POA: Insufficient documentation

## 2024-04-10 DIAGNOSIS — I480 Paroxysmal atrial fibrillation: Secondary | ICD-10-CM | POA: Diagnosis not present

## 2024-04-10 DIAGNOSIS — Z7901 Long term (current) use of anticoagulants: Secondary | ICD-10-CM | POA: Diagnosis not present

## 2024-04-10 DIAGNOSIS — N62 Hypertrophy of breast: Secondary | ICD-10-CM | POA: Insufficient documentation

## 2024-04-10 DIAGNOSIS — J449 Chronic obstructive pulmonary disease, unspecified: Secondary | ICD-10-CM

## 2024-04-10 DIAGNOSIS — J4489 Other specified chronic obstructive pulmonary disease: Secondary | ICD-10-CM | POA: Insufficient documentation

## 2024-04-10 DIAGNOSIS — Z5982 Transportation insecurity: Secondary | ICD-10-CM | POA: Diagnosis not present

## 2024-04-10 DIAGNOSIS — I5022 Chronic systolic (congestive) heart failure: Secondary | ICD-10-CM | POA: Insufficient documentation

## 2024-04-10 DIAGNOSIS — Z720 Tobacco use: Secondary | ICD-10-CM

## 2024-04-10 DIAGNOSIS — M79606 Pain in leg, unspecified: Secondary | ICD-10-CM | POA: Diagnosis not present

## 2024-04-10 MED ORDER — CYANOCOBALAMIN 1000 MCG PO TABS
1000.0000 ug | ORAL_TABLET | Freq: Every day | ORAL | 2 refills | Status: DC
  Filled 2024-04-10: qty 30, 30d supply, fill #0
  Filled 2024-04-10: qty 90, 90d supply, fill #0

## 2024-04-10 MED ORDER — TORSEMIDE 20 MG PO TABS
60.0000 mg | ORAL_TABLET | Freq: Every day | ORAL | 11 refills | Status: DC
  Filled 2024-04-10 – 2024-04-16 (×2): qty 90, 30d supply, fill #0

## 2024-04-10 MED ORDER — ERGOCALCIFEROL 1.25 MG (50000 UT) PO CAPS
1.0000 | ORAL_CAPSULE | ORAL | 2 refills | Status: DC
  Filled 2024-04-10: qty 12, 84d supply, fill #0

## 2024-04-10 MED ORDER — APIXABAN 5 MG PO TABS
5.0000 mg | ORAL_TABLET | Freq: Two times a day (BID) | ORAL | 5 refills | Status: DC
  Filled 2024-04-10 (×2): qty 60, 30d supply, fill #0

## 2024-04-10 MED ORDER — AMIODARONE HCL 200 MG PO TABS
200.0000 mg | ORAL_TABLET | Freq: Every day | ORAL | 2 refills | Status: DC
Start: 2024-04-10 — End: 2024-09-25
  Filled 2024-04-10: qty 84, 84d supply, fill #0
  Filled 2024-04-10: qty 90, 90d supply, fill #0
  Filled 2024-04-10: qty 84, 84d supply, fill #0
  Filled 2024-04-10: qty 6, 6d supply, fill #0
  Filled 2024-07-08: qty 90, 90d supply, fill #1

## 2024-04-10 NOTE — Progress Notes (Signed)
 [facility] - HEART FAILURE CLINIC - PHARMACIST COUNSELING NOTE  Kenneth Benson is a 63 y.o. year old male who presents to the HF clinic for their one week follow-up appointment due to constant fluid overload. They have a past medical history significant for atrial fibrillation (2023), asthma, COPD, cholelithiasis, tobacco use and chronic heart failure. 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 At their last appointment on 03/29/2024, digoxin  was stopped due to having intermittent tremors. Additionally, their losartan  was held since they could not receive IV lasix  to give BP room for increase in torsemide . They didn't get much relief with metolazone  2.5 mg x 3 doses. Their ReDs score was 43%.   Current Guideline-Directed Medical Therapy (GDMT)  ACE/ARB/ARNI: Losartan  25 mg daily - was put on hold previous appointment  Beta Blocker: Metoprolol  tartrate 50 mg twice daily Aldosterone Antagonist: Spironolactone  25 mg daily Diuretic: Torsemide  40 mg daily SGLT2i: Empagliflozin  10 mg daily  Vital Signs and Trends  Recent Trends BP Readings from Last 3 Encounters:  03/29/24 92/66  03/21/24 102/69  02/29/24 99/73   Wt Readings from Last 3 Encounters:  03/29/24 277 lb (125.6 kg)  03/21/24 279 lb 9.6 oz (126.8 kg)  02/29/24 265 lb 4 oz (120.3 kg)   Today's visit HR 80 BP 109/78 Weight (pounds) 273.6 lbs  Do you check your weight daily? [] Yes [x] No  Do you check you blood pressure daily [] Yes [x] No   Cardiac Imaging  - Echo 12/18/22:EF of 45-50% with mildly elevated PA pressure and moderate MR/ TR   Changes Since Last Visit   Hospitalizations/ED visits (last 6 months): NA Medication changes: NA  Pertinent Labs  Lab Results  Component Value Date   NA 136 03/29/2024   CL 96 (L) 03/29/2024   K 3.9 03/29/2024   CO2 29 03/29/2024   BUN 28 (H) 03/29/2024   CREATININE 1.22 03/29/2024   GFRNONAA >60  03/29/2024   CALCIUM  8.3 (L) 03/29/2024   PHOS 3.3 01/13/2024   ALBUMIN 2.6 (L) 01/15/2024   GLUCOSE 100 (H) 03/29/2024   Lab Results  Component Value Date   LDLCALC 85 04/14/2023   Lab Results  Component Value Date   HGBA1C 6.0 (H) 04/13/2023    ASSESSMENT  Reason for today's visit   To assess current guideline directed medical therapy. This is a routine follow-up after they have been fluid overloaded, but unable to receive IV lasix  due to either issues with getting back to the shelter before closing or same day surgery was unable to accommodate then receiving IV lasix . Patient today reported they are feeling better from a fluid stand point. Their ReDs score has been gradually down trending. They reported not stopping the digoxin  and said they haven't had tremors. Patient also mentioned they have tenderness in their breast area which is new from last visit. They also reported needing new apixaban  prescription since they're starting to run low.  They've had excess medications doses for several of their prescriptions, but still rely on getting their medications from the [facility] outpatient pharmacy.   Guideline-Directed Medical Therapy Evaluation  ACEi/ARB/ARNi Patient is not on therapy because currently on   Beta-Blocker  Target Achieved [] Yes [x] No Up-titration candidate today [] Yes [x] No Safety Concerns []  Bradycardia []  Hypotension []  Asthma/COPD  MRA Target Achieved [x] Yes [] No Up-titration candidate today [] Yes [x] No Safety Concerns []  K > 5.0  []  eGFR < 30  []  Hypotension [x] Other Breast pains  SGLT-2 Target Achieved [x] Yes [] No Up-titration candidate today [] Yes [x] No  Loop  Increase dose today  [] Yes [x] No  Heart Failure Symptoms & Volume Status   Dyspnea on exertion, anything manual they have to stop for a breather [x] Yes [] No  Fatigue [x] Yes [] No  Lower extremity edema [x] Yes [] No  Weight gain (> 5 lbs) [x] Yes [] No  Cough or wheezing  [] Yes [x] No   Abdominal bloating/Discomfort [] Yes [x] No  Early satiety or poor appetite [] Yes [x] No  Dizziness of lightheadedness, has moments of lightlessness and tremors  [x] Yes [] No   Adherence Assessment  Do you ever forget to take your medication? [] Yes [] No  Do you ever skip doses due to side effects? [] Yes [] No  Do you have trouble affording your medicines? [] Yes [] No  Are you ever unable to pick up your medication due to transportation difficulties? [] Yes [] No  Do you ever stop taking your medications because you don't believe they are helping? [] Yes [] No   Adherence strategy:  -- They carry their medications around in their medicine bag, they turn their tops upside down for the medications they take twice daily  Barriers to obtaining medications:  -- [facility] pharmacy for medication management / can get a co-pay  Did you take your medications before your appointment today: [x] Yes [] No  Preventative Care   Parameter Most Recent Result Goal/Status Current Medications  LDL 85 < 70 - 55 mg/dL Above goal Statin: Rosuvastatin  5 mg daily   A1c 6.0  7 - 8 % Within goal NA  BP Control 109/78 < 130/80 soft Additional: NA  ECHO 45-50% 6 - 12 months Last ECHO 12/18/2022   PLAN  Medication Interventions:  -- Consider changing their spironolactone  to eplerenone (co-pay about $4 but patient can get from [facility] pharmacy and have the co-pay waived) -- Consider increasing torsemide  and continue to hold losartan  until they are euvolemic  -- Continue current regimen per NP Preventative Care Follow-up:  -- LDL and A1c one year ago, consider rechecking -- Consider increasing statin with depending on updated labs Lab or imaging orders:  -- Due for annual ECHO  Time spent: 15 minutes  Thank you for allowing pharmacy to participate in this patient's care.   Jerusha Reising K Valentin Benney, Pharm.D. Pharmacy Resident 04/10/2024 9:18 AM

## 2024-04-10 NOTE — Patient Instructions (Addendum)
 Medication Changes:  STOP DIGOXIN   INCREASE TORSEMIDE  60 MG ONCE DAILY   Lab Work:  Go DOWN to LOWER LEVEL (LL) to have your blood work completed inside of [employer] office.  We will only call you if the results are abnormal or if the provider would like to make medication changes.   Follow-Up in: 2-3 weeks with [name], FNP.  At the [facility], you and your health needs are our priority. We have a designated team specialized in the treatment of Heart Failure. This Care Team includes your primary Heart Failure Specialized Cardiologist (physician), Advanced Practice Providers (APPs- Physician Assistants and Nurse Practitioners), and Pharmacist who all work together to provide you with the care you need, when you need it.   You may see any of the following providers on your designated Care Team at your next follow up:  Dr. Jules Oar Dr. Peder Bourdon Dr. Alwin Baars Dr. Judyth Nunnery [name], FNP Bevely Brush, RPH-CPP  Please be sure to bring in all your medications bottles to every appointment.   Need to Contact Us :  If you have any questions or concerns before your next appointment please send us  a message through [place] or call our office at [phone].    TO LEAVE A MESSAGE FOR THE NURSE SELECT OPTION 2, PLEASE LEAVE A MESSAGE INCLUDING: YOUR NAME DATE OF BIRTH CALL BACK NUMBER REASON FOR CALL**this is important as we prioritize the call backs  YOU WILL RECEIVE A CALL BACK THE SAME DAY AS LONG AS YOU CALL BEFORE 4:00 PM

## 2024-04-11 ENCOUNTER — Ambulatory Visit: Payer: Self-pay | Admitting: Family

## 2024-04-11 LAB — BASIC METABOLIC PANEL WITH GFR
BUN/Creatinine Ratio: 13 (ref 10–24)
BUN: 17 mg/dL (ref 8–27)
CO2: 27 mmol/L (ref 20–29)
Calcium: 8.9 mg/dL (ref 8.6–10.2)
Chloride: 96 mmol/L (ref 96–106)
Creatinine, Ser: 1.29 mg/dL — ABNORMAL HIGH (ref 0.76–1.27)
Glucose: 95 mg/dL (ref 70–99)
Potassium: 3.8 mmol/L (ref 3.5–5.2)
Sodium: 140 mmol/L (ref 134–144)
eGFR: 63 mL/min/{1.73_m2} (ref >59)

## 2024-04-12 ENCOUNTER — Ambulatory Visit
Admission: RE | Admit: 2024-04-12 | Discharge: 2024-04-12 | Disposition: A | Discharge status: Home / Self Care | Source: Ambulatory Visit | Attending: Acute Care | Admitting: Acute Care

## 2024-04-12 DIAGNOSIS — Z87891 Personal history of nicotine dependence: Secondary | ICD-10-CM | POA: Insufficient documentation

## 2024-04-12 DIAGNOSIS — F17210 Nicotine dependence, cigarettes, uncomplicated: Secondary | ICD-10-CM | POA: Insufficient documentation

## 2024-04-12 DIAGNOSIS — Z122 Encounter for screening for malignant neoplasm of respiratory organs: Secondary | ICD-10-CM | POA: Insufficient documentation

## 2024-04-16 ENCOUNTER — Other Ambulatory Visit: Payer: Self-pay

## 2024-04-18 ENCOUNTER — Telehealth: Payer: Self-pay

## 2024-04-18 NOTE — Telephone Encounter (Signed)
 Called to reschedule appt-no vm set up & no mychart.Ricky Charter letter in the mail

## 2024-04-23 ENCOUNTER — Telehealth: Payer: Self-pay | Admitting: Family
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
## 2024-04-23 NOTE — Telephone Encounter (Signed)
 Called to confirm/remind patient of their appointment at the [facility] Clinic on 04/24/24.   Appointment:   [x] Confirmed  [] Left mess   [] No answer/No voice mail  [] VM Full/unable to leave message  [] Phone not in service  Patient reminded to bring all medications and/or complete list.  Confirmed patient has transportation. Gave directions, instructed to utilize valet parking.

## 2024-04-24 ENCOUNTER — Other Ambulatory Visit: Payer: Self-pay

## 2024-04-24 ENCOUNTER — Ambulatory Visit: Attending: Family | Admitting: Family

## 2024-04-24 ENCOUNTER — Encounter: Payer: Self-pay | Admitting: Family

## 2024-04-24 VITALS — BP 100/71 | HR 76 | Wt 275.4 lb

## 2024-04-24 DIAGNOSIS — I4891 Unspecified atrial fibrillation: Secondary | ICD-10-CM | POA: Insufficient documentation

## 2024-04-24 DIAGNOSIS — J449 Chronic obstructive pulmonary disease, unspecified: Secondary | ICD-10-CM

## 2024-04-24 DIAGNOSIS — I480 Paroxysmal atrial fibrillation: Secondary | ICD-10-CM

## 2024-04-24 DIAGNOSIS — Z5982 Transportation insecurity: Secondary | ICD-10-CM | POA: Insufficient documentation

## 2024-04-24 DIAGNOSIS — J4489 Other specified chronic obstructive pulmonary disease: Secondary | ICD-10-CM | POA: Insufficient documentation

## 2024-04-24 DIAGNOSIS — R5383 Other fatigue: Secondary | ICD-10-CM | POA: Diagnosis not present

## 2024-04-24 DIAGNOSIS — F17210 Nicotine dependence, cigarettes, uncomplicated: Secondary | ICD-10-CM | POA: Insufficient documentation

## 2024-04-24 DIAGNOSIS — I5022 Chronic systolic (congestive) heart failure: Secondary | ICD-10-CM | POA: Diagnosis present

## 2024-04-24 DIAGNOSIS — R0602 Shortness of breath: Secondary | ICD-10-CM | POA: Diagnosis not present

## 2024-04-24 DIAGNOSIS — Z7901 Long term (current) use of anticoagulants: Secondary | ICD-10-CM | POA: Diagnosis not present

## 2024-04-24 DIAGNOSIS — Z720 Tobacco use: Secondary | ICD-10-CM | POA: Diagnosis not present

## 2024-04-24 DIAGNOSIS — Z79899 Other long term (current) drug therapy: Secondary | ICD-10-CM | POA: Insufficient documentation

## 2024-04-24 MED ORDER — EPLERENONE 25 MG PO TABS
12.5000 mg | ORAL_TABLET | Freq: Every day | ORAL | 3 refills | Status: DC
  Filled 2024-04-24: qty 45, 90d supply, fill #0
  Filled 2024-04-24: qty 15, 30d supply, fill #0
  Filled 2024-04-24: qty 30, 60d supply, fill #0

## 2024-04-24 NOTE — Patient Instructions (Addendum)
 Please call [facility] at [phone] to reschedule your June appointment. That provider is going to be out of the office on the date you were supposed to see her.    Call [facility] cardiology at [phone] to get your appointment scheduled with Dr. Braxton Calico who you saw in December.    Medication Changes:  STOP Spironolactone   START Epleronone 12.5mg  (1/2 tab) daily  Lab Work:  Go DOWN to LOWER LEVEL (LL) to have your blood work completed inside of [employer] office.  We will only call you if the results are abnormal or if the provider would like to make medication changes.    Follow-Up in: Please follow up with the Advanced Heart Failure Clinic in 1 month with [name], FNP.  At the [facility] Clinic, you and your health needs are our priority. We have a designated team specialized in the treatment of Heart Failure. This Care Team includes your primary Heart Failure Specialized Cardiologist (physician), Advanced Practice Providers (APPs- Physician Assistants and Nurse Practitioners), and Pharmacist who all work together to provide you with the care you need, when you need it.   You may see any of the following providers on your designated Care Team at your next follow up:  Dr. Jules Oar Dr. Peder Bourdon Dr. Alwin Baars Dr. Judyth Nunnery [name], FNP Bevely Brush, RPH-CPP  Please be sure to bring in all your medications bottles to every appointment.   Need to Contact Us :  If you have any questions or concerns before your next appointment please send us  a message through [place] or call our office at [phone].    TO LEAVE A MESSAGE FOR THE NURSE SELECT OPTION 2, PLEASE LEAVE A MESSAGE INCLUDING: YOUR NAME DATE OF BIRTH CALL BACK NUMBER REASON FOR CALL**this is important as we prioritize the call backs  YOU WILL RECEIVE A CALL BACK THE SAME DAY AS LONG AS YOU CALL BEFORE 4:00 PM

## 2024-04-25 ENCOUNTER — Other Ambulatory Visit: Payer: Self-pay

## 2024-04-25 ENCOUNTER — Ambulatory Visit: Payer: Self-pay | Admitting: Family

## 2024-04-25 LAB — BASIC METABOLIC PANEL WITH GFR
BUN/Creatinine Ratio: 14 (ref 10–24)
BUN: 18 mg/dL (ref 8–27)
CO2: 26 mmol/L (ref 20–29)
Calcium: 9.1 mg/dL (ref 8.6–10.2)
Chloride: 97 mmol/L (ref 96–106)
Creatinine, Ser: 1.26 mg/dL (ref 0.76–1.27)
Glucose: 68 mg/dL — ABNORMAL LOW (ref 70–99)
Potassium: 4.3 mmol/L (ref 3.5–5.2)
Sodium: 142 mmol/L (ref 134–144)
eGFR: 65 mL/min/{1.73_m2} (ref >59)

## 2024-04-25 MED ORDER — TRAMADOL HCL 50 MG PO TABS
50.0000 mg | ORAL_TABLET | Freq: Two times a day (BID) | ORAL | 0 refills | Status: DC | PRN
  Filled 2024-04-25: qty 14, 7d supply, fill #0

## 2024-04-30 ENCOUNTER — Ambulatory Visit: Admitting: Physician Assistant

## 2024-05-03 ENCOUNTER — Other Ambulatory Visit: Payer: Self-pay

## 2024-05-03 MED ORDER — OXYCODONE HCL 10 MG PO TABS
10.0000 mg | ORAL_TABLET | Freq: Every day | ORAL | 0 refills | Status: DC
  Filled 2024-05-03: qty 168, 28d supply, fill #0

## 2024-05-06 ENCOUNTER — Other Ambulatory Visit: Payer: Self-pay

## 2024-05-06 DIAGNOSIS — Z87891 Personal history of nicotine dependence: Secondary | ICD-10-CM

## 2024-05-06 DIAGNOSIS — F17210 Nicotine dependence, cigarettes, uncomplicated: Secondary | ICD-10-CM

## 2024-05-06 DIAGNOSIS — Z122 Encounter for screening for malignant neoplasm of respiratory organs: Secondary | ICD-10-CM

## 2024-05-23 ENCOUNTER — Telehealth: Payer: Self-pay | Admitting: Family

## 2024-05-23 NOTE — Telephone Encounter (Signed)
 Called to confirm/remind patient of their appointment at the [facility] Clinic on 05/24/24.   Appointment:   [x] Confirmed  [] Left mess   [] No answer/No voice mail  [] VM Full/unable to leave message  [] Phone not in service  Patient reminded to bring all medications and/or complete list.  Confirmed patient has transportation. Gave directions, instructed to utilize valet parking.
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
## 2024-05-24 ENCOUNTER — Encounter: Payer: Self-pay | Admitting: Family

## 2024-05-24 ENCOUNTER — Other Ambulatory Visit: Payer: Self-pay

## 2024-05-24 ENCOUNTER — Ambulatory Visit: Attending: Family | Admitting: Family

## 2024-05-24 VITALS — BP 108/87 | HR 82 | Wt 287.0 lb

## 2024-05-24 DIAGNOSIS — Z8249 Family history of ischemic heart disease and other diseases of the circulatory system: Secondary | ICD-10-CM | POA: Insufficient documentation

## 2024-05-24 DIAGNOSIS — I4891 Unspecified atrial fibrillation: Secondary | ICD-10-CM | POA: Diagnosis not present

## 2024-05-24 DIAGNOSIS — J4489 Other specified chronic obstructive pulmonary disease: Secondary | ICD-10-CM | POA: Diagnosis not present

## 2024-05-24 DIAGNOSIS — I480 Paroxysmal atrial fibrillation: Secondary | ICD-10-CM

## 2024-05-24 DIAGNOSIS — Z79899 Other long term (current) drug therapy: Secondary | ICD-10-CM | POA: Insufficient documentation

## 2024-05-24 DIAGNOSIS — J449 Chronic obstructive pulmonary disease, unspecified: Secondary | ICD-10-CM

## 2024-05-24 DIAGNOSIS — Z720 Tobacco use: Secondary | ICD-10-CM | POA: Diagnosis not present

## 2024-05-24 DIAGNOSIS — Z5982 Transportation insecurity: Secondary | ICD-10-CM | POA: Insufficient documentation

## 2024-05-24 DIAGNOSIS — Z7901 Long term (current) use of anticoagulants: Secondary | ICD-10-CM | POA: Insufficient documentation

## 2024-05-24 DIAGNOSIS — F17210 Nicotine dependence, cigarettes, uncomplicated: Secondary | ICD-10-CM | POA: Diagnosis not present

## 2024-05-24 DIAGNOSIS — I5022 Chronic systolic (congestive) heart failure: Secondary | ICD-10-CM | POA: Diagnosis present

## 2024-05-24 MED ORDER — ROSUVASTATIN CALCIUM 5 MG PO TABS
5.0000 mg | ORAL_TABLET | Freq: Every day | ORAL | 3 refills | Status: DC
  Filled 2024-05-24: qty 90, 90d supply, fill #0

## 2024-05-24 MED ORDER — LOSARTAN POTASSIUM 25 MG PO TABS
25.0000 mg | ORAL_TABLET | Freq: Every day | ORAL | 5 refills | Status: DC
  Filled 2024-05-24: qty 30, 30d supply, fill #0

## 2024-05-24 MED ORDER — APIXABAN 5 MG PO TABS
5.0000 mg | ORAL_TABLET | Freq: Two times a day (BID) | ORAL | 3 refills | Status: DC
  Filled 2024-05-24: qty 180, 90d supply, fill #0

## 2024-05-24 MED ORDER — APIXABAN 5 MG PO TABS
5.0000 mg | ORAL_TABLET | Freq: Two times a day (BID) | ORAL | 5 refills | Status: DC
  Filled 2024-05-24: qty 60, 30d supply, fill #0

## 2024-05-24 MED ORDER — TORSEMIDE 20 MG PO TABS
60.0000 mg | ORAL_TABLET | Freq: Every day | ORAL | 11 refills | Status: AC
  Filled 2024-05-24: qty 90, 30d supply, fill #0

## 2024-05-24 MED ORDER — TORSEMIDE 20 MG PO TABS
60.0000 mg | ORAL_TABLET | Freq: Every day | ORAL | 3 refills | Status: DC
  Filled 2024-05-24: qty 270, 90d supply, fill #0

## 2024-05-24 MED ORDER — ROSUVASTATIN CALCIUM 5 MG PO TABS
5.0000 mg | ORAL_TABLET | Freq: Every day | ORAL | 2 refills | Status: DC
  Filled 2024-05-24: qty 90, 90d supply, fill #0

## 2024-05-24 NOTE — Patient Instructions (Addendum)
 Please call PCP office and get your appointment rescheduled. [phone]
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
## 2024-05-29 ENCOUNTER — Encounter: Admitting: Family

## 2024-05-29 ENCOUNTER — Telehealth: Payer: Self-pay | Admitting: Family

## 2024-05-29 NOTE — Telephone Encounter (Signed)
 Patient did not show for his Heart Failure Clinic appointment on 05/29/24.

## 2024-05-30 ENCOUNTER — Encounter: Admitting: Family

## 2024-06-02 ENCOUNTER — Other Ambulatory Visit: Payer: Self-pay

## 2024-06-02 MED ORDER — OXYCODONE HCL 10 MG PO TABS
10.0000 mg | ORAL_TABLET | Freq: Every day | ORAL | 0 refills | Status: DC | PRN
  Filled 2024-06-02: qty 150, 32d supply, fill #0

## 2024-06-05 ENCOUNTER — Telehealth: Payer: Self-pay | Admitting: Family

## 2024-06-05 NOTE — Telephone Encounter (Signed)
 Called to confirm/remind patient of their appointment at the Advanced Heart Failure Clinic on 06/06/24.   Appointment:   [x] Confirmed  [] Left mess   [] No answer/No voice mail  [] VM Full/unable to leave message  [] Phone not in service  Patient reminded to bring all medications and/or complete list.  Confirmed patient has transportation. Gave directions, instructed to utilize valet parking.

## 2024-06-06 ENCOUNTER — Other Ambulatory Visit
Admission: RE | Admit: 2024-06-06 | Discharge: 2024-06-06 | Disposition: A | Discharge status: Home / Self Care | Source: Ambulatory Visit | Attending: Family | Admitting: Family

## 2024-06-06 ENCOUNTER — Encounter: Payer: Self-pay | Admitting: Family

## 2024-06-06 ENCOUNTER — Ambulatory Visit: Payer: Self-pay | Admitting: Family

## 2024-06-06 ENCOUNTER — Ambulatory Visit (HOSPITAL_BASED_OUTPATIENT_CLINIC_OR_DEPARTMENT_OTHER): Admitting: Family

## 2024-06-06 ENCOUNTER — Other Ambulatory Visit: Payer: Self-pay

## 2024-06-06 VITALS — BP 105/81 | HR 52 | Wt 277.2 lb

## 2024-06-06 DIAGNOSIS — Z720 Tobacco use: Secondary | ICD-10-CM | POA: Diagnosis not present

## 2024-06-06 DIAGNOSIS — I5022 Chronic systolic (congestive) heart failure: Secondary | ICD-10-CM

## 2024-06-06 DIAGNOSIS — J449 Chronic obstructive pulmonary disease, unspecified: Secondary | ICD-10-CM

## 2024-06-06 DIAGNOSIS — I480 Paroxysmal atrial fibrillation: Secondary | ICD-10-CM | POA: Diagnosis not present

## 2024-06-06 LAB — BASIC METABOLIC PANEL WITH GFR
Anion gap: 10 (ref 5–15)
BUN: 14 mg/dL (ref 8–23)
CO2: 42 mmol/L — ABNORMAL HIGH (ref 22–32)
Calcium: 8.8 mg/dL — ABNORMAL LOW (ref 8.9–10.3)
Chloride: 89 mmol/L — ABNORMAL LOW (ref 98–111)
Creatinine, Ser: 1.3 mg/dL — ABNORMAL HIGH (ref 0.61–1.24)
GFR, Estimated: >60 mL/min (ref >60)
Glucose, Bld: 105 mg/dL — ABNORMAL HIGH (ref 70–99)
Potassium: 3.6 mmol/L (ref 3.5–5.1)
Sodium: 141 mmol/L (ref 135–145)

## 2024-06-06 MED ORDER — ALBUTEROL SULFATE HFA 108 (90 BASE) MCG/ACT IN AERS
1.0000 | INHALATION_SPRAY | RESPIRATORY_TRACT | 1 refills | Status: DC | PRN
  Filled 2024-06-06: qty 54, 90d supply, fill #0

## 2024-06-06 MED ORDER — ALBUTEROL SULFATE HFA 108 (90 BASE) MCG/ACT IN AERS: 1.0000 | INHALATION_SPRAY | RESPIRATORY_TRACT | 1 refills | Status: DC | PRN

## 2024-06-06 MED ORDER — LOSARTAN POTASSIUM 25 MG PO TABS
12.5000 mg | ORAL_TABLET | Freq: Every day | ORAL | 3 refills | Status: AC
  Filled 2024-06-06: qty 45, 90d supply, fill #0

## 2024-06-06 MED ORDER — ALBUTEROL SULFATE HFA 108 (90 BASE) MCG/ACT IN AERS
2.0000 | INHALATION_SPRAY | RESPIRATORY_TRACT | 1 refills | Status: DC | PRN
  Filled 2024-06-06: qty 54, 90d supply, fill #0
  Filled 2024-06-07: qty 54, 51d supply, fill #0
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
## 2024-06-06 NOTE — Patient Instructions (Addendum)
 Medication Changes:  START Losartan  12.5mg  (1 tab) daily  REFILLED Albuterol   Lab Work:  Go over to the [facility]. Go pass the gift shop and have your blood work completed.  We will only call you if the results are abnormal or if the provider would like to make medication changes.    Special Instructions // Education: [facility] phone number: [phone]  Follow-Up in:   At the Advanced [facility] Failure Clinic, you and your health needs are our priority. We have a designated team specialized in the treatment of Heart Failure. This Care Team includes your primary Heart Failure Specialized Cardiologist (physician), Advanced Practice Providers (APPs- Physician Assistants and Nurse Practitioners), and Pharmacist who all work together to provide you with the care you need, when you need it.   You may see any of the following providers on your designated Care Team at your next follow up:  Dr. Toribio Fuel Dr. Ezra Shuck Dr. Ria Commander Dr. Odis Brownie Ellouise Class, FNP Jaun Bash, RPH-CPP  Please be sure to bring in all your medications bottles to every appointment.   Need to Contact Us :  If you have any questions or concerns before your next appointment please send us  a message through [place] or call our office at [phone].    TO LEAVE A MESSAGE FOR THE NURSE SELECT OPTION 2, PLEASE LEAVE A MESSAGE INCLUDING: YOUR NAME DATE OF BIRTH CALL BACK NUMBER REASON FOR CALL**this is important as we prioritize the call backs  YOU WILL RECEIVE A CALL BACK THE SAME DAY AS LONG AS YOU CALL BEFORE 4:00 PM

## 2024-06-07 ENCOUNTER — Other Ambulatory Visit: Payer: Self-pay

## 2024-06-25 ENCOUNTER — Other Ambulatory Visit: Payer: Self-pay

## 2024-06-25 MED ORDER — TRELEGY ELLIPTA 100-62.5-25 MCG/ACT IN AEPB
1.0000 | INHALATION_SPRAY | Freq: Every day | RESPIRATORY_TRACT | 3 refills | Status: DC
  Filled 2024-06-25: qty 60, 30d supply, fill #0

## 2024-07-01 ENCOUNTER — Other Ambulatory Visit: Payer: Self-pay

## 2024-07-01 MED ORDER — OXYCODONE HCL 10 MG PO TABS
10.0000 mg | ORAL_TABLET | Freq: Every day | ORAL | 0 refills | Status: DC | PRN
  Filled 2024-07-08: qty 150, 32d supply, fill #0

## 2024-07-03 ENCOUNTER — Other Ambulatory Visit: Payer: Self-pay

## 2024-07-08 ENCOUNTER — Other Ambulatory Visit: Payer: Self-pay

## 2024-08-06 ENCOUNTER — Telehealth: Payer: Self-pay | Admitting: Family

## 2024-08-06 NOTE — Telephone Encounter (Signed)
 Called to confirm/remind patient of their appointment at the Advanced [facility] Failure Clinic on 08/07/24.   Appointment:   [] Confirmed  [] Left mess   [] No answer/No voice mail  [x] VM Full/unable to leave message  [] Phone not in service  Patient reminded to bring all medications and/or complete list.  Confirmed patient has transportation. Gave directions, instructed to utilize valet parking.

## 2024-08-07 ENCOUNTER — Encounter: Admitting: Family

## 2024-08-07 ENCOUNTER — Telehealth: Payer: Self-pay | Admitting: Family

## 2024-08-07 NOTE — Progress Notes (Deleted)
 Advanced Heart Failure Clinic Note    PCP: Pcp, No  Cardiologist: Dewane Shiner, MD   Chief Complaint: shortness of breath   HPI:  Kenneth Benson is a 63 y/o male with a history of atrial fibrillation (2023), asthma, COPD, cholelithiasis, tobacco use and chronic heart failure.   Admitted 12/16/22 due to AF/ HF exacerbation. Diuresed. Initially on bipap and trilogy to be delivered to patient's home. Echo 12/18/22:EF of 45-50% with mildly elevated PA pressure and moderate Kenneth/ TRHas had multiple admissions with COPD, AF/ RVR or GI issues.   Admitted 01/10/24 with RUQ, epigastric and right flank pain for about 3 weeks with associated nausea and vomiting. Admitted with transaminitis in the setting of cholelithiasis and choledocholithiasis as noted on MRCP. Patient underwent ERCP on 01/13/2024. He underwent lap chole on 01/14/2024. LFT's improved and no post-op complications.   ED visit 02/15/24 due to viral pharyngitis.   Was seen in the HF clinic 04/25 and was fluid up. Metolazone  2.5mg  / Potassium 20meq was given every other day X 3 doses.   Seen in [facility] 05/25 and torsemide  was increased to 40mg  daily with holding of losartan  due to hypotension. Potassium 20meq daily started and digoxin  was stopped due to level of 1.0  Seen in [facility] 05/25 torsemide  was increased to 60mg  daily.   Seen in [facility] 05/25 and spironolactone  was stopped d/t gynecomastia and epleronone was started  Seen in [facility] 06/25 where patient declined IV lasix . Torsemide / potassium were resumed.   He presents today for a HF follow-up visit with a chief complaint of shortness of breath (improving). Has associated fatigue, chest congestion, nonproductive cough, dizziness with coughing, wheezing. Pedal edema has resolved since resuming his torsemide . Is asking about an albuterol  inhaler refill to help with his wheezing.   Currently living in a motel which has been less stressful. He's unclear how long he can afford to stay there.     ROS: All systems negative except what is listed in HPI, PMH and Problem List   Past Medical History:  Diagnosis Date   A-fib (HCC) 02/06/2022   AKI (acute kidney injury) (HCC) 12/17/2022   Arrhythmia    atrial fibrillation   Asthma    CHF (congestive heart failure) (HCC)    COPD (chronic obstructive pulmonary disease) (HCC)    Hyperkalemia 12/17/2022   Hypokalemia 04/13/2023   Myocardial injury 04/13/2023   Tobacco abuse     Current Outpatient Medications  Medication Sig Dispense Refill   albuterol  (VENTOLIN  HFA) 108 (90 Base) MCG/ACT inhaler Inhale 1 puff into the lungs every 4 (four) hours as needed. 54 g 1   albuterol  (VENTOLIN  HFA) 108 (90 Base) MCG/ACT inhaler Inhale 1 puff into the lungs every 4 (four) hours as needed for wheezing and shortness of breath. 54 g 1   albuterol  (VENTOLIN  HFA) 108 (90 Base) MCG/ACT inhaler Inhale 2 puffs into the lungs every 4 (four) hours as needed for wheezing/shortness of breath. 54 g 1   amiodarone  (PACERONE ) 200 MG tablet Take 1 tablet (200 mg total) by mouth daily. 90 tablet 2   apixaban  (ELIQUIS ) 5 MG TABS tablet Take 1 tablet (5 mg total) by mouth 2 (two) times daily. 180 tablet 3   cyanocobalamin  1000 MCG tablet Take 1 tablet (1,000 mcg total) by mouth daily. (Patient not taking: Reported on 06/06/2024) 90 tablet 2   eplerenone  (INSPRA ) 25 MG tablet Take 0.5 tablets (12.5 mg total) by mouth daily. 45 tablet 3   ergocalciferol  (VITAMIN D2)  1.25 MG (50000 UT) capsule Take 1 capsule (50,000 Units total) by mouth once a week. (Patient not taking: Reported on 04/24/2024) 12 capsule 2   Fluticasone -Umeclidin-Vilant (TRELEGY ELLIPTA ) 100-62.5-25 MCG/ACT AEPB Inhale 1 puff into the lungs daily. 180 each 3   Fluticasone -Umeclidin-Vilant (TRELEGY ELLIPTA ) 200-62.5-25 MCG/ACT AEPB Inhale 1 puff into the lungs daily.     JARDIANCE  10 MG TABS tablet Take 1 tablet (10 mg total) by mouth daily. 90 tablet 2   losartan  (COZAAR ) 25 MG tablet Take 0.5 tablets  (12.5 mg total) by mouth daily. 45 tablet 3   metoprolol  tartrate (LOPRESSOR ) 50 MG tablet Take 1 tablet (50 mg total) by mouth 2 (two) times daily. 60 tablet 0   Oxycodone  HCl 10 MG TABS Take 1 tablet (10 mg total) by mouth 5 (five) times daily as needed. 150 tablet 0   potassium chloride  SA (KLOR-CON  M) 20 MEQ tablet Take 20 mEq by mouth daily.     rosuvastatin  (CRESTOR ) 5 MG tablet Take 1 tablet (5 mg total) by mouth daily. 90 tablet 2   torsemide  (DEMADEX ) 20 MG tablet Take 3 tablets (60 mg total) by mouth daily. 90 tablet 11   No current facility-administered medications for this visit.    No Known Allergies    Social History   Socioeconomic History   Marital status: Single    Spouse name: Not on file   Number of children: Not on file   Years of education: Not on file   Highest education level: Not on file  Occupational History   Not on file  Tobacco Use   Smoking status: Every Day    Current packs/day: 1.50    Average packs/day: 1.5 packs/day for 27.0 years (40.5 ttl pk-yrs)    Types: Cigarettes   Smokeless tobacco: Not on file   Tobacco comments:    0.5PPD at the most.  trying to quit.  05/11/2023 hfb  Vaping Use   Vaping status: Never Used  Substance and Sexual Activity   Alcohol use: No   Drug use: No   Sexual activity: Not Currently  Other Topics Concern   Not on file  Social History Narrative   Not on file   Social Drivers of Health   Financial Resource Strain: High Risk (08/02/2024)   Received from [facility]   Overall Financial Resource Strain (CARDIA)    How hard is it for you to pay for the very basics like food, housing, medical care, and heating?: Very hard  Food Insecurity: Food Insecurity Present (08/02/2024)   Received from [facility]   Hunger Vital Sign    Within the past 12 months, you worried that your food would run out before you got the money to buy more.: Often true    Within the past 12 months, the food you bought just didn't last  and you didn't have money to get more.: Often true  Transportation Needs: Unmet Transportation Needs (08/02/2024)   Received from [facility]   PRAPARE - Transportation    Lack of Transportation (Medical): Yes    Lack of Transportation (Non-Medical): No  Physical Activity: Not on file  Stress: Not on file  Social Connections: Moderately Isolated (12/27/2023)   Social Connection and Isolation Panel    Frequency of Communication with Friends and Family: Twice a week    Frequency of Social Gatherings with Friends and Family: Twice a week    Attends Religious Services: Never    Production manager of [employer]  or Organizations: No    Attends Banker Meetings: Never    Marital Status: Living with partner  Intimate Partner Violence: Not At Risk (08/02/2024)   Received from [facility]   Humiliation, Afraid, Rape, and Kick questionnaire    Within the last year, have you been afraid of your partner or ex-partner?: No    Within the last year, have you been humiliated or emotionally abused in other ways by your partner or ex-partner?: No    Within the last year, have you been kicked, hit, slapped, or otherwise physically hurt by your partner or ex-partner?: No    Within the last year, have you been raped or forced to have any kind of sexual activity by your partner or ex-partner?: No      Family History  Problem Relation Age of Onset   Heart disease Mother    Atrial fibrillation Father     There were no vitals filed for this visit.  Wt Readings from Last 3 Encounters:  06/06/24 125.7 kg  05/24/24 130.2 kg  04/24/24 124.9 kg   Lab Results  Component Value Date   CREATININE 1.30 (H) 06/06/2024   CREATININE 1.26 04/24/2024   CREATININE 1.29 (H) 04/10/2024     PHYSICAL EXAM:  General: Well appearing. No resp difficulty HEENT: normal Neck: supple, no JVD Cor: Regular rhythm, bradycardic. No rubs, gallops or murmurs Lungs: expiratory wheezing throughout all lung fields,  more pronounced in upper lobes Abdomen: soft, nontender, nondistended. Scrotal edema  Extremities: no cyanosis, clubbing, rash, trace pitting edema Neuro: alert & oriented X 3. Moves all 4 extremities w/o difficulty. Affect pleasant   ECG: not done    ASSESSMENT & PLAN:  1: Chronic heart failure with mildly reduced ejection fraction- - suspect due to AF  - NYHA class II - euvolemic - weight down 10 pounds from last visit here 1 month ago as he's resumed his torsemide  - BP 105/81 - Echo 12/18/22: EF of 45-50% with mildly elevated PA pressure and moderate Kenneth/ TR - continue torsemide  60mg  daily/ continue potassium 20meq daily; BMET today - continue jardiance  10mg  daily - begin losartan  12.5mg  daily; he can stop this if dizziness worsens or BP when checked at [employer] gets a SBP <100 - continue metoprolol  tartrate 50mg  BID (AF) - continue epleronone 12.5mg  daily; could titrate this to 25mg   - not adding salt to his food - BNP 03/29/24 was 74.4  2: Atrial fibrillation- - saw cardiology (Custovic) 12/24; he has not called their office yet to get appt scheduled so their number was provided today - continue amiodarone  200mg  daily - continue apixaban  5mg  BID  - continue metoprolol  tartrate 50mg  BID - dig level 03/29/24 was 1.0 & digoxin  was stopped   3: COPD- - saw pulmonology (Gleason) 05/25 - trilogy inhaler bedtime - continue albuterol  for PRN use; refilled today - BMET 04/24/24 reviewed: sodium 142, potassium 4.3, creatinine 1.26 & GFR 65 - BMET today  4: Tobacco use- - smokes 1/2 ppd cigarettes due to stress - cessation discussed - Chest CT for screening done 04/12/24 - had [place] PCP appt scheduled for 06/25 but note in chart says it was cancelled as provider would be out of office; their number was provided, again, so that he can call and get it rescheduled   Return in 2 months, sooner if needed.  Solomon Blumenthal, RN 08/07/24

## 2024-08-07 NOTE — Telephone Encounter (Signed)
 Patient did not show for his Heart Failure Clinic appointment on 08/07/24.

## 2024-08-22 ENCOUNTER — Telehealth: Payer: Self-pay | Admitting: Family

## 2024-08-22 NOTE — Telephone Encounter (Signed)
 Called to confirm/remind patient of their appointment at the Advanced [facility] Failure Clinic on 08/23/24.   Appointment:   [] Confirmed  [] Left mess   [] No answer/No voice mail  [x] VM Full/unable to leave message  [] Phone not in service  Patient reminded to bring all medications and/or complete list.  Confirmed patient has transportation. Gave directions, instructed to utilize valet parking.

## 2024-08-22 NOTE — Progress Notes (Unsigned)
 Advanced Heart Failure Clinic Note    PCP: Pcp, No  Cardiologist: Dewane Shiner, MD   Chief Complaint: shortness of breath   HPI:  [name] is a 63 y/o male with a history of atrial fibrillation (2023), asthma, COPD, cholelithiasis, tobacco use and chronic heart failure.   Admitted 12/16/22 due to AF/ HF exacerbation. Diuresed. Initially on bipap and trilogy to be delivered to patient's home. Echo 12/18/22:EF of 45-50% with mildly elevated PA pressure and moderate MR/ TRHas had multiple admissions with COPD, AF/ RVR or GI issues.   Admitted 01/10/24 with RUQ, epigastric and right flank pain for about 3 weeks with associated nausea and vomiting. Admitted with transaminitis in the setting of cholelithiasis and choledocholithiasis as noted on MRCP. Patient underwent ERCP on 01/13/2024. He underwent lap chole on 01/14/2024. LFT's improved and no post-op complications.   ED visit 02/15/24 due to viral pharyngitis.   Was seen in the HF clinic 04/25 and was fluid up. Metolazone  2.5mg  / Potassium 20meq was given every other day X 3 doses.   Seen in [facility] 05/25 and torsemide  was increased to 40mg  daily with holding of losartan  due to hypotension. Potassium 20meq daily started and digoxin  was stopped due to level of 1.0  Seen in [facility] 05/25 torsemide  was increased to 60mg  daily.   Seen in [facility] 05/25 and spironolactone  was stopped d/t gynecomastia and epleronone was started  Seen in [facility] 06/25 where patient declined IV lasix . Torsemide / potassium were resumed.   He presents today for a HF follow-up visit with a chief complaint of shortness of breath (improving). Has associated fatigue, chest congestion, nonproductive cough, dizziness with coughing, wheezing. Pedal edema has resolved since resuming his torsemide . Is asking about an albuterol  inhaler refill to help with his wheezing.   Currently living in a motel which has been less stressful. He's unclear how long he can afford to stay there.     ROS: All systems negative except what is listed in HPI, PMH and Problem List   Past Medical History:  Diagnosis Date   A-fib (HCC) 02/06/2022   AKI (acute kidney injury) 12/17/2022   Arrhythmia    atrial fibrillation   Asthma    CHF (congestive heart failure) (HCC)    COPD (chronic obstructive pulmonary disease) (HCC)    Hyperkalemia 12/17/2022   Hypokalemia 04/13/2023   Myocardial injury 04/13/2023   Tobacco abuse     Current Outpatient Medications  Medication Sig Dispense Refill   albuterol  (VENTOLIN  HFA) 108 (90 Base) MCG/ACT inhaler Inhale 1 puff into the lungs every 4 (four) hours as needed. 54 g 1   albuterol  (VENTOLIN  HFA) 108 (90 Base) MCG/ACT inhaler Inhale 1 puff into the lungs every 4 (four) hours as needed for wheezing and shortness of breath. 54 g 1   albuterol  (VENTOLIN  HFA) 108 (90 Base) MCG/ACT inhaler Inhale 2 puffs into the lungs every 4 (four) hours as needed for wheezing/shortness of breath. 54 g 1   amiodarone  (PACERONE ) 200 MG tablet Take 1 tablet (200 mg total) by mouth daily. 90 tablet 2   apixaban  (ELIQUIS ) 5 MG TABS tablet Take 1 tablet (5 mg total) by mouth 2 (two) times daily. 180 tablet 3   cyanocobalamin  1000 MCG tablet Take 1 tablet (1,000 mcg total) by mouth daily. (Patient not taking: Reported on 06/06/2024) 90 tablet 2   eplerenone  (INSPRA ) 25 MG tablet Take 0.5 tablets (12.5 mg total) by mouth daily. 45 tablet 3   ergocalciferol  (VITAMIN D2) 1.25  MG (50000 UT) capsule Take 1 capsule (50,000 Units total) by mouth once a week. (Patient not taking: Reported on 04/24/2024) 12 capsule 2   Fluticasone -Umeclidin-Vilant (TRELEGY ELLIPTA ) 100-62.5-25 MCG/ACT AEPB Inhale 1 puff into the lungs daily. 180 each 3   Fluticasone -Umeclidin-Vilant (TRELEGY ELLIPTA ) 200-62.5-25 MCG/ACT AEPB Inhale 1 puff into the lungs daily.     JARDIANCE  10 MG TABS tablet Take 1 tablet (10 mg total) by mouth daily. 90 tablet 2   losartan  (COZAAR ) 25 MG tablet Take 0.5 tablets (12.5  mg total) by mouth daily. 45 tablet 3   metoprolol  tartrate (LOPRESSOR ) 50 MG tablet Take 1 tablet (50 mg total) by mouth 2 (two) times daily. 60 tablet 0   Oxycodone  HCl 10 MG TABS Take 1 tablet (10 mg total) by mouth 5 (five) times daily as needed. 150 tablet 0   potassium chloride  SA (KLOR-CON  M) 20 MEQ tablet Take 20 mEq by mouth daily.     rosuvastatin  (CRESTOR ) 5 MG tablet Take 1 tablet (5 mg total) by mouth daily. 90 tablet 2   torsemide  (DEMADEX ) 20 MG tablet Take 3 tablets (60 mg total) by mouth daily. 90 tablet 11   No current facility-administered medications for this visit.    No Known Allergies    Social History   Socioeconomic History   Marital status: Single    Spouse name: Not on file   Number of children: Not on file   Years of education: Not on file   Highest education level: Not on file  Occupational History   Not on file  Tobacco Use   Smoking status: Every Day    Current packs/day: 1.50    Average packs/day: 1.5 packs/day for 27.0 years (40.5 ttl pk-yrs)    Types: Cigarettes   Smokeless tobacco: Not on file   Tobacco comments:    0.5PPD at the most.  trying to quit.  05/11/2023 hfb  Vaping Use   Vaping status: Never Used  Substance and Sexual Activity   Alcohol use: No   Drug use: No   Sexual activity: Not Currently  Other Topics Concern   Not on file  Social History Narrative   Not on file   Social Drivers of Health   Financial Resource Strain: High Risk (08/02/2024)   Received from [facility]   Overall Financial Resource Strain (CARDIA)    How hard is it for you to pay for the very basics like food, housing, medical care, and heating?: Very hard  Food Insecurity: Food Insecurity Present (08/02/2024)   Received from [facility]   Hunger Vital Sign    Within the past 12 months, you worried that your food would run out before you got the money to buy more.: Often true    Within the past 12 months, the food you bought just didn't last and you  didn't have money to get more.: Often true  Transportation Needs: Unmet Transportation Needs (08/02/2024)   Received from [facility]   PRAPARE - Transportation    Lack of Transportation (Medical): Yes    Lack of Transportation (Non-Medical): No  Physical Activity: Not on file  Stress: Not on file  Social Connections: Moderately Isolated (12/27/2023)   Social Connection and Isolation Panel    Frequency of Communication with Friends and Family: Twice a week    Frequency of Social Gatherings with Friends and Family: Twice a week    Attends Religious Services: Never    Database administrator or  Organizations: No    Attends Banker Meetings: Never    Marital Status: Living with partner  Intimate Partner Violence: Not At Risk (08/02/2024)   Received from [facility]   Humiliation, Afraid, Rape, and Kick questionnaire    Within the last year, have you been afraid of your partner or ex-partner?: No    Within the last year, have you been humiliated or emotionally abused in other ways by your partner or ex-partner?: No    Within the last year, have you been kicked, hit, slapped, or otherwise physically hurt by your partner or ex-partner?: No    Within the last year, have you been raped or forced to have any kind of sexual activity by your partner or ex-partner?: No      Family History  Problem Relation Age of Onset   Heart disease Mother    Atrial fibrillation Father     There were no vitals filed for this visit.  Wt Readings from Last 3 Encounters:  06/06/24 277 lb 3.2 oz (125.7 kg)  05/24/24 287 lb (130.2 kg)  04/24/24 275 lb 6 oz (124.9 kg)   Lab Results  Component Value Date   CREATININE 1.30 (H) 06/06/2024   CREATININE 1.26 04/24/2024   CREATININE 1.29 (H) 04/10/2024     PHYSICAL EXAM:  General: Well appearing. No resp difficulty HEENT: normal Neck: supple, no JVD Cor: Regular rhythm, bradycardic. No rubs, gallops or murmurs Lungs: expiratory wheezing  throughout all lung fields, more pronounced in upper lobes Abdomen: soft, nontender, nondistended. Scrotal edema  Extremities: no cyanosis, clubbing, rash, trace pitting edema Neuro: alert & oriented X 3. Moves all 4 extremities w/o difficulty. Affect pleasant   ECG: not done    ASSESSMENT & PLAN:  1: Chronic heart failure with mildly reduced ejection fraction- - suspect due to AF  - NYHA class II - euvolemic - weight down 10 pounds from last visit here 1 month ago as he's resumed his torsemide  - BP 105/81 - Echo 12/18/22: EF of 45-50% with mildly elevated PA pressure and moderate MR/ TR - continue torsemide  60mg  daily/ continue potassium 20meq daily; BMET today - continue jardiance  10mg  daily - begin losartan  12.5mg  daily; he can stop this if dizziness worsens or BP when checked at [employer] gets a SBP <100 - continue metoprolol  tartrate 50mg  BID (AF) - continue epleronone 12.5mg  daily; could titrate this to 25mg   - not adding salt to his food - BNP 03/29/24 was 74.4  2: Atrial fibrillation- - saw cardiology (Custovic) 12/24; he has not called their office yet to get appt scheduled so their number was provided today - continue amiodarone  200mg  daily - continue apixaban  5mg  BID  - continue metoprolol  tartrate 50mg  BID - dig level 03/29/24 was 1.0 & digoxin  was stopped   3: COPD- - saw pulmonology (Gleason) 05/25 - trilogy inhaler bedtime - continue albuterol  for PRN use; refilled today - BMET 04/24/24 reviewed: sodium 142, potassium 4.3, creatinine 1.26 & GFR 65 - BMET today  4: Tobacco use- - smokes 1/2 ppd cigarettes due to stress - cessation discussed - Chest CT for screening done 04/12/24 - had [place] PCP appt scheduled for 06/25 but note in chart says it was cancelled as provider would be out of office; their number was provided, again, so that he can call and get it rescheduled   Return in 2 months, sooner if needed.  Ellouise DELENA Class, FNP 08/22/24

## 2024-08-23 ENCOUNTER — Ambulatory Visit: Admitting: Family

## 2024-08-23 ENCOUNTER — Other Ambulatory Visit
Admission: RE | Admit: 2024-08-23 | Discharge: 2024-08-23 | Disposition: A | Discharge status: Home / Self Care | Source: Ambulatory Visit | Attending: Family | Admitting: Family

## 2024-08-23 ENCOUNTER — Encounter: Payer: Self-pay | Admitting: Family

## 2024-08-23 ENCOUNTER — Ambulatory Visit: Payer: Self-pay | Admitting: Family

## 2024-08-23 VITALS — BP 91/70 | HR 80 | Wt 302.0 lb

## 2024-08-23 DIAGNOSIS — J449 Chronic obstructive pulmonary disease, unspecified: Secondary | ICD-10-CM

## 2024-08-23 DIAGNOSIS — I5022 Chronic systolic (congestive) heart failure: Secondary | ICD-10-CM | POA: Insufficient documentation

## 2024-08-23 DIAGNOSIS — Z720 Tobacco use: Secondary | ICD-10-CM | POA: Diagnosis not present

## 2024-08-23 DIAGNOSIS — I480 Paroxysmal atrial fibrillation: Secondary | ICD-10-CM

## 2024-08-23 LAB — BASIC METABOLIC PANEL WITH GFR
Anion gap: 11 (ref 5–15)
BUN: 15 mg/dL (ref 8–23)
CO2: 31 mmol/L (ref 22–32)
Calcium: 8.2 mg/dL — ABNORMAL LOW (ref 8.9–10.3)
Chloride: 99 mmol/L (ref 98–111)
Creatinine, Ser: 1.37 mg/dL — ABNORMAL HIGH (ref 0.61–1.24)
GFR, Estimated: 59 mL/min — ABNORMAL LOW (ref >60)
Glucose, Bld: 121 mg/dL — ABNORMAL HIGH (ref 70–99)
Potassium: 3.8 mmol/L (ref 3.5–5.1)
Sodium: 141 mmol/L (ref 135–145)

## 2024-08-23 LAB — BRAIN NATRIURETIC PEPTIDE: B Natriuretic Peptide: 173.3 pg/mL — ABNORMAL HIGH (ref 0.0–100.0)

## 2024-08-23 NOTE — Patient Instructions (Signed)
 Medication Changes:  STOP Metoprolol  Tartrate  Lab Work:  Go over to the [facility]ALL. Go pass the gift shop and have your blood work completed.  We will only call you if the results are abnormal or if the provider would like to make medication changes.  No news is good news.    Follow-Up in: Please follow up with the Advanced Heart Failure Clinic in 1 week with Kenneth Class, FNP   Thank you for choosing [facility] Advanced Heart Failure [facility].    At the Advanced Heart Failure Clinic, you and your health needs are our priority. We have a designated team specialized in the treatment of Heart Failure. This Care Team includes your primary Heart Failure Specialized Cardiologist (physician), Advanced Practice Providers (APPs- Physician Assistants and Nurse Practitioners), and Pharmacist who all work together to provide you with the care you need, when you need it.   You may see any of the following providers on your designated Care Team at your next follow up:  Dr. Toribio Fuel Dr. Ezra Shuck Dr. Ria Commander Dr. Morene Brownie Kenneth Class, FNP Jaun Bash, RPH-CPP  Please be sure to bring in all your medications bottles to every appointment.   Need to Contact Us :  If you have any questions or concerns before your next appointment please send us  a message through [place] or call our office at [phone].    TO LEAVE A MESSAGE FOR THE NURSE SELECT OPTION 2, PLEASE LEAVE A MESSAGE INCLUDING: YOUR NAME DATE OF BIRTH CALL BACK NUMBER REASON FOR CALL**this is important as we prioritize the call backs  YOU WILL RECEIVE A CALL BACK THE SAME DAY AS LONG AS YOU CALL BEFORE 4:00 PM

## 2024-08-23 NOTE — Progress Notes (Signed)
 ReDS Vest / Clip - 08/23/24 1100       ReDS Vest / Clip   Station Marker D    Ruler Value 37    ReDS Value Range Moderate volume overload    ReDS Actual Value 39

## 2024-08-29 ENCOUNTER — Telehealth: Payer: Self-pay | Admitting: Family

## 2024-08-29 NOTE — Telephone Encounter (Signed)
 Called to confirm/remind patient of their appointment at the Advanced Heart Failure Clinic on 08/30/24.   Appointment:   [] Confirmed  [x] Left mess   [] No answer/No voice mail  [] VM Full/unable to leave message  [] Phone not in service  Patient reminded to bring all medications and/or complete list.  Confirmed patient has transportation. Gave directions, instructed to utilize valet parking.

## 2024-08-29 NOTE — Progress Notes (Deleted)
 Advanced Heart Failure Clinic Note    PCP: Pcp, No  Cardiologist: Dewane Shiner, MD   Chief Complaint:    HPI:  [name] is a 63 y/o male with a history of atrial fibrillation (2023), asthma, COPD, cholelithiasis, tobacco use and chronic heart failure.   Admitted 12/16/22 due to AF/ HF exacerbation. Diuresed. Initially on bipap and trilogy to be delivered to patient's home. Echo 12/18/22:EF of 45-50% with mildly elevated PA pressure and moderate MR/ TRHas had multiple admissions with COPD, AF/ RVR or GI issues.   Admitted 01/10/24 with RUQ, epigastric and right flank pain for about 3 weeks with associated nausea and vomiting. Admitted with transaminitis in the setting of cholelithiasis and choledocholithiasis as noted on MRCP. Patient underwent ERCP on 01/13/2024. He underwent lap chole on 01/14/2024. LFT's improved and no post-op complications.   ED visit 02/15/24 due to viral pharyngitis.   Was seen in the HF clinic 04/25 and was fluid up. Metolazone  2.5mg  / Potassium 20meq was given every other day X 3 doses.   Seen in [facility] 05/25 and torsemide  was increased to 40mg  daily with holding of losartan  due to hypotension. Potassium 20meq daily started and digoxin  was stopped due to level of 1.0  Seen in [facility] 05/25 torsemide  was increased to 60mg  daily.   Seen in [facility] 05/25 and spironolactone  was stopped d/t gynecomastia and epleronone was started  Seen in [facility] 06/25 where patient declined IV lasix . Torsemide / potassium were resumed.   Admitted 08/01/24 with acute on chronic hypoxic respiratory failure, requiring escalation of oxygen therapy and intermittent use of BiPAP for hypoxemia and hypercapnia. Echocardiogram 08/06/24: EF 45-50%, grade I diastolic dysfunction, and mild to moderate RV dilation with preserved function. IV diuresed. Able to be on room air while awake but desatted at night. Had been unable to use home CPAP due to being homeless.   He presents today for a HF follow-up visit  with a chief complaint of    Per discharge summary, he's supposed to have oxygen and CPAP but with his currently living conditions, that is not an option.   ROS: All systems negative except what is listed in HPI, PMH and Problem List   Past Medical History:  Diagnosis Date   A-fib (HCC) 02/06/2022   AKI (acute kidney injury) 12/17/2022   Arrhythmia    atrial fibrillation   Asthma    CHF (congestive heart failure) (HCC)    COPD (chronic obstructive pulmonary disease) (HCC)    Hyperkalemia 12/17/2022   Hypokalemia 04/13/2023   Myocardial injury 04/13/2023   Tobacco abuse     Current Outpatient Medications  Medication Sig Dispense Refill   albuterol  (VENTOLIN  HFA) 108 (90 Base) MCG/ACT inhaler Inhale 1 puff into the lungs every 4 (four) hours as needed. 54 g 1   albuterol  (VENTOLIN  HFA) 108 (90 Base) MCG/ACT inhaler Inhale 1 puff into the lungs every 4 (four) hours as needed for wheezing and shortness of breath. 54 g 1   albuterol  (VENTOLIN  HFA) 108 (90 Base) MCG/ACT inhaler Inhale 2 puffs into the lungs every 4 (four) hours as needed for wheezing/shortness of breath. 54 g 1   amiodarone  (PACERONE ) 200 MG tablet Take 1 tablet (200 mg total) by mouth daily. 90 tablet 2   apixaban  (ELIQUIS ) 5 MG TABS tablet Take 1 tablet (5 mg total) by mouth 2 (two) times daily. 180 tablet 3   eplerenone  (INSPRA ) 25 MG tablet Take 0.5 tablets (12.5 mg total) by mouth daily. 45 tablet  3   Fluticasone -Umeclidin-Vilant (TRELEGY ELLIPTA ) 100-62.5-25 MCG/ACT AEPB Inhale 1 puff into the lungs daily. 180 each 3   Fluticasone -Umeclidin-Vilant (TRELEGY ELLIPTA ) 200-62.5-25 MCG/ACT AEPB Inhale 1 puff into the lungs daily. (Patient not taking: Reported on 08/23/2024)     JARDIANCE  10 MG TABS tablet Take 1 tablet (10 mg total) by mouth daily. 90 tablet 2   losartan  (COZAAR ) 25 MG tablet Take 0.5 tablets (12.5 mg total) by mouth daily. 45 tablet 3   metoprolol  succinate (TOPROL -XL) 50 MG 24 hr tablet Take 50 mg by  mouth in the morning and at bedtime. Take with or immediately following a meal.     Oxycodone  HCl 10 MG TABS Take 1 tablet (10 mg total) by mouth 5 (five) times daily as needed. 150 tablet 0   potassium chloride  SA (KLOR-CON  M) 20 MEQ tablet Take 20 mEq by mouth daily. (Patient not taking: Reported on 08/23/2024)     rosuvastatin  (CRESTOR ) 5 MG tablet Take 1 tablet (5 mg total) by mouth daily. 90 tablet 2   torsemide  (DEMADEX ) 20 MG tablet Take 3 tablets (60 mg total) by mouth daily. (Patient taking differently: Take 40 mg by mouth 2 (two) times daily.) 90 tablet 11   No current facility-administered medications for this visit.    No Known Allergies    Social History   Socioeconomic History   Marital status: Single    Spouse name: Not on file   Number of children: Not on file   Years of education: Not on file   Highest education level: Not on file  Occupational History   Not on file  Tobacco Use   Smoking status: Every Day    Current packs/day: 1.50    Average packs/day: 1.5 packs/day for 27.0 years (40.5 ttl pk-yrs)    Types: Cigarettes   Smokeless tobacco: Not on file   Tobacco comments:    0.5PPD at the most.  trying to quit.  05/11/2023 hfb  Vaping Use   Vaping status: Never Used  Substance and Sexual Activity   Alcohol use: No   Drug use: No   Sexual activity: Not Currently  Other Topics Concern   Not on file  Social History Narrative   Not on file   Social Drivers of Health   Financial Resource Strain: High Risk (08/02/2024)   Received from [facility]   Overall Financial Resource Strain (CARDIA)    How hard is it for you to pay for the very basics like food, housing, medical care, and heating?: Very hard  Food Insecurity: Food Insecurity Present (08/02/2024)   [facility]   Hunger Vital Sign    Within the past 12 months, you worried that your food would run out before you got the money to buy more.: Often true    Within the past 12 months, the  food you bought just didn't last and you didn't have money to get more.: Often true  Transportation Needs: Unmet Transportation Needs (08/02/2024)   Received from [facility]   PRAPARE - Transportation    Lack of Transportation (Medical): Yes    Lack of Transportation (Non-Medical): No  Physical Activity: Not on file  Stress: Not on file  Social Connections: Moderately Isolated (12/27/2023)   Social Connection and Isolation Panel    Frequency of Communication with Friends and Family: Twice a week    Frequency of Social Gatherings with Friends and Family: Twice a week    Attends Religious Services: Never  Active Member of Clubs or Organizations: No    Attends Banker Meetings: Never    Marital Status: Living with partner  Intimate Partner Violence: Not At Risk (08/02/2024)   Received from [facility]   Humiliation, Afraid, Rape, and Kick questionnaire    Within the last year, have you been afraid of your partner or ex-partner?: No    Within the last year, have you been humiliated or emotionally abused in other ways by your partner or ex-partner?: No    Within the last year, have you been kicked, hit, slapped, or otherwise physically hurt by your partner or ex-partner?: No    Within the last year, have you been raped or forced to have any kind of sexual activity by your partner or ex-partner?: No      Family History  Problem Relation Age of Onset   Heart disease Mother    Atrial fibrillation Father       PHYSICAL EXAM:  General: Well appearing.  Cor: No JVD. Regular rhythm, rate.  Lungs: expiratory wheezes throughout all lung fields. + rales in RLL Abdomen: soft, nontender, nondistended. Extremities: 1+ pitting edema bilateral lower legs Neuro:. Affect pleasant    ECG: not done     ASSESSMENT & PLAN:  1: Chronic heart failure with mildly reduced ejection fraction- - suspect due to AF  - NYHA class III - high risk of re-admission due to is current  living situation of being in a tent - weight 302 pounds from last visit here 1 week ago  - BP  - Echo 12/18/22: EF of 45-50% with mildly elevated PA pressure and moderate MR/ TR - [facility] 08/06/24: EF 45-50%, grade I diastolic dysfunction, and mild to moderate RV dilation with preserved function - continue torsemide  60mg  daily/ continue potassium 20meq daily - continue jardiance  10mg  daily - continue losartan  12.5mg  daily - continue metoprolol  succinate 50mg  BID (AF). Stop the metoprolol  tartrate - continue epleronone 12.5mg  daily; could titrate this to 25mg  in the future if BP allows - not adding salt to his food - BNP 08/23/24 was 173.3  2: Atrial fibrillation- - saw cardiology (Custovic) 12/24; he has not called their office yet to get appt scheduled so their number was provided today - continue amiodarone  200mg  daily - continue apixaban  5mg  BID  - continue metoprolol  succinate 50mg  BID - dig level 03/29/24 was 1.0 & digoxin  was stopped   3: COPD- - saw pulmonology (Gleason) 05/25 - trilogy inhaler bedtime - continue albuterol  for PRN use - BMET 08/23/24 reviewed: sodium 141, potassium 3.8, creatinine 1.37 & GFR 59 - BMET today  4: Tobacco use- - smokes 1/2 ppd cigarettes due to stress - cessation discussed - Chest CT for screening done 04/12/24     Ellouise DELENA Class, FNP 08/29/24

## 2024-08-30 ENCOUNTER — Encounter: Admitting: Family

## 2024-08-30 ENCOUNTER — Telehealth: Payer: Self-pay | Admitting: Family

## 2024-08-30 NOTE — Telephone Encounter (Signed)
 Patient did not show for his Heart Failure Clinic appointment on 08/30/24.

## 2024-09-05 NOTE — Discharge Summary (Addendum)
 Discharge summary [facility] 09/05/24    Patient name: Kenneth Benson. Dady DOB 30-Nov-1960 MRN#: [id number] PCP: [facility], [place] [facility] Time: 3:55 PM Primary Care Provider:  Practice, [facility] Family Inpatient primary attending provider: Margart Elsie Dragon, [facility] Course: 10/3: Patient being admitted to the hospital for respiratory failure requiring BiPAP.  Also atrial fibrillation with RVR requiring diltiazem  infusion.  Respiratory failure secondary to heart failure and COPD exacerbation. 10/4: Weaned off of diltiazem  infusion.  Weaned off of BiPAP. 10/7: Down to 2 L of oxygen via regular nasal cannula however patient currently without stable living environment, no electricity, unable to discharge with supplemental oxygen. 10/8: Required 5 L of oxygen overnight, weaned back down to 2 L this morning  Kenneth Benson, age 63, with a history of atrial fibrillation, heart failure with mildly reduced ejection fraction, COPD, obesity, and sleep apnea, was admitted for acute on chronic respiratory failure with hypoxia and hypercapnia, presenting with shortness of breath and upper airway congestion.  ---  **Acute on Chronic Respiratory Failure with Hypoxia and Hypercapnia** He presented with severe respiratory distress and ABG findings of respiratory acidosis, hypercapnia, and hypoxia (pH 7.25, pCO2 80.2, pO2 61, O2 sat 88.3%). Initial management included BiPAP, followed by weaning to nasal cannula, with oxygen requirements fluctuating between 2-5 L/min during the hospitalization. Wheezing persisted throughout the admission, with periods of hypoxia requiring increased oxygen flow and PRN nebulized bronchodilators. He was on RA at discharge and saturating in the high 80s with no respiratory complaints, due to homelessness and lack of electricity, discharge with home oxygen is not feasible.  **COPD Exacerbation** He was treated with IV methylprednisolone  and scheduled  nebulizers throughout the admission, with a completed 3-day course of azithromycin . Respiratory viral panel was negative. Persistent wheezing and dyspnea were noted, with improvement in symptoms over time; airway clearance techniques and incentive spirometry were utilized. Cardiology recommended post-discharge pulmonology follow-up.  **Acute on Chronic Systolic and Diastolic Heart Failure (NYHA Class IV)** He had acute decompensation likely precipitated by atrial fibrillation with RVR and noncompliance with medications and fluid restrictions. BNP was markedly elevated (7317), and he exhibited significant lower extremity and scrotal edema. Initial diuresis was achieved with IV Lasix , then transitioned to IV bumetanide due to decreased urine output and weight gain; net negative fluid balance was achieved (up to -11.5 L cumulative UOP). Guideline-directed medical therapy was continued, and strict I/O, daily weights, and sodium/fluid restrictions were maintained. Patient will follow up with his primary cardiologist for medication titration outpatient.   **Atrial Fibrillation with Rapid Ventricular Response** He presented in atrial fibrillation with RVR (HR 140s), initially managed with IV diltiazem  bolus and infusion, which was discontinued on 10/4 after rate control was achieved. Metoprolol  was resumed and titrated up to 100 mg BID for improved rate control per cardiology. He remained in atrial fibrillation throughout the admission, with rate generally controlled (HR 80s-110s). Amiodarone  and digoxin  had been discontinued during a prior hospitalization. Cardiology recommended outpatient follow-up and consideration of DC cardioversion.  **Hyperglycemia** He had intermittent hyperglycemia during the admission, likely steroid-induced, with point-of-care glucose values ranging up to 191 mg/dL. Hemoglobin A1c was 5.4%. Correctional insulin  was administered as needed.  **Hypertension** His essential hypertension  was managed with continuation of home antihypertensive medications.  **Other Relevant Issues** He is homeless and unable to be discharged with home oxygen or CPAP due to lack of power source, which impacted discharge planning and medication compliance. He remained alert and oriented throughout the hospitalization, with no acute  infection or hospital-acquired injury documented. Bruising and peripheral edema were present but no new skin breakdown occurred. Pain was managed with PRN oxycodone .  **Medication Discontinuations** Diltiazem  infusion was discontinued after rate control was achieved. Azithromycin  was discontinued after a 3-day course for COPD exacerbation.  **Consultations** Cardiology was consulted for management of atrial fibrillation and heart failure, with recommendations for medication titration, transition to oral diuretics, and outpatient follow-up.  **Disposition** Discharge planning was complicated by homelessness and inability to provide home oxygen or CPAP; case management was involved for housing options. At discharge, he was stable on RA however he would qualify for home O2 but due to homelessness unable to arrange for this.    _____________________________________  Please see problem generated Hospital course above  Discharge exam: Saturating in the upper 80s on room air, scattered wheezing throughout bilateral lung fields, respiratory status reported to be at baseline per patient Heart with irregular rhythm consistent with A-fib, rate controlled less than 100 Abdomen soft, nontender, distended Extremities without any peripheral edema _____________________________________  Temp:  [36.7 C (98.1 F)-36.8 C (98.2 F)] 36.8 C (98.2 F) Pulse:  [44-85] 64 Resp:  [18-20] 20 BP: (95-108)/(68-78) 97/74 FiO2 (%):  [21 %-28 %] 21 % SpO2:  [87 %-91 %] 88 % Body mass index is 39.77 kg/m. Intake/Output last 3 shifts: I/O last 3 completed shifts: In: 1662 [P.O.:1662] Out:  4000 [Urine:4000]  Consults Requested  IP CONSULT TO CARDIOLOGY     In hospital Nutrition: Nutrition Therapy Heart Healthy; Fluid 2000 ml    An advanced care planning discussion was had with patient and/or patient's decisions maker (documented separately).  CODE STATUS :                    Full Code   Discharge estimated within 1-2 days. Anticipated disposition:  To Home ______________________________________________________________  Current Medications[1] ________________________________________________________________  No Known Allergies   Past Medical History[2]  Past Surgical History[3]   Family History[4]        Imaging  ECG 12 Lead Result Date: 09/02/2024 Atrial fibrillation with premature ventricular or aberrantly conducted complexes Nonspecific T wave abnormality Prolonged QT Abnormal ECG When compared with ECG of 30-Aug-2024 22:19, No significant change was found Confirmed by Sheng, Siyuan [phone]) on 09/02/2024 8:23:15 AM  ECG 12 Lead Result Date: 09/02/2024 Atrial fibrillation with premature ventricular or aberrantly conducted complexes Nonspecific ST and T wave abnormality Abnormal ECG When compared with ECG of 30-Aug-2024 17:40, T wave inversion less evident in Lateral leads Confirmed by Sheng, Siyuan [phone]) on 09/02/2024 8:19:40 AM  ECG 12 Lead Result Date: 08/30/2024 Atrial fibrillation with rapid ventricular response Nonspecific ST and T wave abnormality Abnormal ECG When compared with ECG of 30-Aug-2024 15:43, No significant change was found  ECG 12 Lead Result Date: 08/30/2024 Atrial fibrillation with rapid ventricular response Cannot rule out Anterior infarct , age undetermined Marked ST abnormality, possible lateral subendocardial injury Abnormal ECG When compared with ECG of 01-Aug-2024 09:39, ST now depressed in Lateral leads  XR Chest Portable Result Date: 08/30/2024 Exam:  Portable Chest  History:  63 year old male, shortness of breath  Technique:   Single frontal view.  Comparison:  08/04/2024  Findings:   Unchanged enlarged cardiac silhouette. Clear lungs. No pleural effusion or pneumothorax.    Unchanged enlarged cardiac silhouette.  Signed (Electronic Signature): 08/30/2024 4:57 PM Signed By: Carlin Essex, MD   Lab Results   Recent Labs    09/04/24 0442  WBC 7.7  HGB 16.6  HCT 52.5*  PLT 236   Recent Labs    09/03/24 0537 09/04/24 0442  NA 144 140  K 4.1 3.8  CL 98 93*  CO2 45.2* 46.7*  BUN 30* 35*  CREATININE 0.98 1.17  GLU 124 167  CALCIUM  8.2* 8.7  MG 2.3  --    No results for input(s): CKTOTAL, CKMB, PCTCKMB, TROPONINI, EDTPNI, BNP, INR, LABPROT, APTT, DDIMER in the last 72 hours. No results for input(s): WBCUA, NITRITE, LEUKOCYTESUR, BACTERIA, RBCUA, BLOODU, GLUCOSEU, PROTEINUA, KETONESU, KETUR in the last 72 hours. No results for input(s): OPIAU, BENZU, TRICYCLIC, PCPU, AMPHU, COCAU, CANNAU, BARBU, ETOH, ACETAMIN, SALICYLATE in the last 72 hours. No results for input(s): PREGTESTUR, PREGPOC in the last 72 hours. No results for input(s): OCCULTBLD, RAPSCRN, CDIFRPCR, CDIFFNAP1, A1C, CHOL, LDL, HDL, TRIG in the last 72 hours. No results for input(s): O2SOUR, FIO2ART, PHART, PCO2ART, PO2ART, HCO3ART, O2SATART, BEART in the last 72 hours.   Margart Dragon, DO Hospitalist, [facility] 09/05/24, 3:55 PM       [1]  Current Facility-Administered Medications:  .  acetaminophen  (TYLENOL ) tablet 650 mg, 650 mg, Oral, Q4H PRN, Bettejane Donnice Pastor, DO .  apixaban  (ELIQUIS ) tablet 5 mg, 5 mg, Oral, BID, Bettejane Donnice Pastor, DO, 5 mg at 09/05/24 9189 .  atorvastatin (LIPITOR) tablet 10 mg, 10 mg, Oral, Nightly, Bettejane Donnice Pastor, DO, 10 mg at 09/04/24 2030 .  bumetanide (BUMEX) injection 2 mg, 2 mg, Intravenous, BID, Bettejane Donnice Pastor, DO, 2 mg at 09/05/24 1429 .  calcium  carbonate (TUMS) chewable tablet  400 mg elem calcium , 400 mg elem calcium , Oral, Daily PRN, Bettejane Donnice Pastor, DO .  dextrose  (GLUTOSE) 40 % gel 15 g of dextrose , 15 g of dextrose , Oral, Q10 Min PRN, Bettejane Donnice Pastor, DO .  dextrose  50 % in water  (D50W) 50 % solution 12.5 g, 12.5 g, Intravenous, Q15 Min PRN, Bettejane Donnice Pastor, DO .  empagliflozin  (JARDIANCE ) tablet 10 mg, 10 mg, Oral, Daily, Bettejane Donnice Pastor, DO, 10 mg at 09/05/24 9189 .  fluticasone  furoate-vilanterol (BREO ELLIPTA ) 200-25 mcg/dose inhaler 1 puff, 1 puff, Inhalation, Daily (RT), 1 puff at 09/05/24 0935 **AND** umeclidinium (INCRUSE ELLIPTA ) 62.5 mcg/actuation inhaler 1 puff, 1 puff, Inhalation, Daily (RT), Bettejane Donnice Pastor, DO, 1 puff at 09/05/24 0936 .  glucagon  injection 1 mg, 1 mg, Intramuscular, Once PRN, Bettejane Donnice Pastor, DO .  guaiFENesin  (MUCINEX ) 12 hr tablet 1,200 mg, 1,200 mg, Oral, BID, Bettejane Donnice Pastor, DO, 1,200 mg at 09/05/24 9188 .  insulin  lispro (HumaLOG) injection CORRECTIONAL 0-20 Units, 0-20 Units, Subcutaneous, ACHS, Bettejane Donnice Pastor, DO, 1 Units at 09/05/24 1206 .  ipratropium-albuterol  (DUO-NEB) 0.5-2.5 mg/3 mL nebulizer solution 3 mL, 3 mL, Nebulization, Q6H PRN, Bettejane Donnice Pastor, DO, 3 mL at 09/04/24 1511 .  losartan  (COZAAR ) tablet 12.5 mg, 12.5 mg, Oral, Daily, Bettejane Donnice Pastor, DO, 12.5 mg at 09/04/24 9257 .  melatonin tablet 3 mg, 3 mg, Oral, Nightly PRN, Bettejane Donnice Pastor, DO .  methylPREDNISolone  sodium succinate (SOLU-Medrol ) injection 40 mg, 40 mg, Intravenous, Daily, Dragon Margart Fallow, DO, 40 mg at 09/05/24 9183 .  metoPROLOL  succinate (Toprol -XL) 24 hr tablet 100 mg, 100 mg, Oral, BID, Richarda Prentice Ruth, FNP, 100 mg at 09/04/24 0740 .  nicotine  (NICODERM CQ ) 21 mg/24 hr patch 1 patch, 1 patch, Transdermal, Daily, Bettejane Donnice Pastor, DO .  nicotine  polacrilex (NICORETTE) gum 4 mg, 4 mg, Buccal, Q1H PRN, Bettejane Donnice Pastor, DO .  ondansetron  (ZOFRAN )  injection 4 mg, 4 mg, Intravenous,  Q8H PRN **OR** ondansetron  (ZOFRAN ) injection 8 mg, 8 mg, Intravenous, Q8H PRN, Bettejane Donnice Pastor, DO .  oxyCODONE  (ROXICODONE ) immediate release tablet 10 mg, 10 mg, Oral, Q4H PRN, Bettejane Donnice Pastor, DO, 10 mg at 09/05/24 1432 .  polyethylene glycol (MIRALAX ) packet 17 g, 17 g, Oral, Daily PRN, Bettejane Donnice Pastor, DO [2] Past Medical History: Diagnosis Date  . Asthma (HHS-HCC)   . Atrial fib/flutter, transient (CMS-HCC)   . CHF (congestive heart failure) (CMS-HCC)   [3] Past Surgical History: Procedure Laterality Date  . CHOLECYSTECTOMY  01/13/2023  [4] No family history on file.

## 2024-09-25 ENCOUNTER — Telehealth: Payer: Self-pay

## 2024-09-25 ENCOUNTER — Telehealth: Payer: Self-pay | Admitting: Family

## 2024-09-25 NOTE — Telephone Encounter (Signed)
 Kenneth Class, FNP received a phone call from the police stating that pt was found deceased in bed.

## 2024-09-25 NOTE — Telephone Encounter (Signed)
 Received phone call from [facility] who was at patient's residence with a paramedic. Patient was found deceased in his bed without signs of any foul play. Reports that family saw patient last night and he was having worsening shortness of breath. Officer is calling to see if our practice would sign the death certificate as he hadn't gotten established with a PCP yet.   Had a recent admission at [facility] 08/30/24 with respiratory failure initially requiring bipap. ABG findings of respiratory acidosis, hypercapnia and hypoxia. Weaned down to oxygen at 2L nasal cannula.and then to room air. Given antibiotics for COPD exacerbation. HF exacerbation likely precipitated by AF RVR and noncompliance with medications/ fluid restrictions. BNP elevated. IV diuresed. IV diltiazem  for AF. Discharged 09/05/24 without oxygen due to not having permanent housing.   Last seen in [facility] 08/23/24 and at that time was living in a tent and was inconsistent with taking his meds due to housing situation.   Officer says that he will let the funeral home aware so that they can send death certificate to our office.

## 2024-09-28 DEATH — deceased

## 2024-10-07 ENCOUNTER — Ambulatory Visit: Admitting: Physician Assistant

## 2024-10-07 ENCOUNTER — Telehealth: Payer: Self-pay | Admitting: Family

## 2024-10-07 NOTE — Telephone Encounter (Signed)
 [facility] calling to get update on death certificate. She states that she sent it over via fax. If you have any questions you can call the funeral home at [phone]
